# Patient Record
Sex: Female | Born: 1950
Health system: Southern US, Community
[De-identification: ages and names within clinical notes are randomized; demographics above are authoritative.]

## PROBLEM LIST (undated history)

## (undated) DIAGNOSIS — E559 Vitamin D deficiency, unspecified: Secondary | ICD-10-CM

## (undated) DIAGNOSIS — E785 Hyperlipidemia, unspecified: Secondary | ICD-10-CM

## (undated) DIAGNOSIS — I5043 Acute on chronic combined systolic (congestive) and diastolic (congestive) heart failure: Secondary | ICD-10-CM

## (undated) DIAGNOSIS — E039 Hypothyroidism, unspecified: Secondary | ICD-10-CM

## (undated) DIAGNOSIS — I10 Essential (primary) hypertension: Secondary | ICD-10-CM

## (undated) DIAGNOSIS — M549 Dorsalgia, unspecified: Secondary | ICD-10-CM

## (undated) DIAGNOSIS — I499 Cardiac arrhythmia, unspecified: Secondary | ICD-10-CM

## (undated) DIAGNOSIS — M25559 Pain in unspecified hip: Secondary | ICD-10-CM

## (undated) DIAGNOSIS — I2510 Atherosclerotic heart disease of native coronary artery without angina pectoris: Secondary | ICD-10-CM

## (undated) DIAGNOSIS — I4891 Unspecified atrial fibrillation: Secondary | ICD-10-CM

## (undated) DIAGNOSIS — M1990 Unspecified osteoarthritis, unspecified site: Secondary | ICD-10-CM

## (undated) DIAGNOSIS — R112 Nausea with vomiting, unspecified: Secondary | ICD-10-CM

## (undated) DIAGNOSIS — Z9889 Other specified postprocedural states: Secondary | ICD-10-CM

## (undated) DIAGNOSIS — T8859XA Other complications of anesthesia, initial encounter: Secondary | ICD-10-CM

## (undated) DIAGNOSIS — I219 Acute myocardial infarction, unspecified: Secondary | ICD-10-CM

## (undated) HISTORY — DX: Acute myocardial infarction, unspecified: I21.9

## (undated) HISTORY — DX: Vitamin D deficiency, unspecified: E55.9

## (undated) HISTORY — PX: COLONOSCOPY: SHX174

## (undated) HISTORY — PX: SHOULDER ARTHROSCOPY: SHX128

## (undated) HISTORY — PX: ABDOMINAL HYSTERECTOMY: SHX81

## (undated) HISTORY — PX: TUBAL LIGATION: SHX77

## (undated) HISTORY — DX: Hyperlipidemia, unspecified: E78.5

## (undated) HISTORY — PX: CHOLECYSTECTOMY: SHX55

## (undated) HISTORY — PX: OTHER SURGICAL HISTORY: SHX169

---

## 2001-03-22 ENCOUNTER — Emergency Department (HOSPITAL_COMMUNITY): Admission: EM | Admit: 2001-03-22 | Discharge: 2001-03-22 | Payer: Self-pay | Admitting: Internal Medicine

## 2005-07-15 ENCOUNTER — Encounter: Admission: RE | Admit: 2005-07-15 | Discharge: 2005-07-15 | Payer: Self-pay | Admitting: Internal Medicine

## 2005-12-11 ENCOUNTER — Ambulatory Visit: Payer: Self-pay | Admitting: Orthopedic Surgery

## 2006-01-24 ENCOUNTER — Ambulatory Visit (HOSPITAL_COMMUNITY): Admission: RE | Admit: 2006-01-24 | Discharge: 2006-01-24 | Payer: Self-pay | Admitting: General Surgery

## 2006-01-24 ENCOUNTER — Encounter (INDEPENDENT_AMBULATORY_CARE_PROVIDER_SITE_OTHER): Payer: Self-pay | Admitting: General Surgery

## 2006-01-29 ENCOUNTER — Ambulatory Visit: Payer: Self-pay | Admitting: Orthopedic Surgery

## 2006-02-24 ENCOUNTER — Ambulatory Visit: Payer: Self-pay | Admitting: Orthopedic Surgery

## 2006-04-21 ENCOUNTER — Ambulatory Visit: Payer: Self-pay | Admitting: Orthopedic Surgery

## 2006-08-20 ENCOUNTER — Emergency Department (HOSPITAL_COMMUNITY): Admission: EM | Admit: 2006-08-20 | Discharge: 2006-08-20 | Payer: Self-pay | Admitting: Emergency Medicine

## 2007-06-30 ENCOUNTER — Ambulatory Visit: Payer: Self-pay | Admitting: Internal Medicine

## 2007-07-03 ENCOUNTER — Ambulatory Visit (HOSPITAL_COMMUNITY): Admission: RE | Admit: 2007-07-03 | Discharge: 2007-07-03 | Payer: Self-pay | Admitting: Internal Medicine

## 2007-07-03 ENCOUNTER — Ambulatory Visit: Payer: Self-pay | Admitting: Internal Medicine

## 2008-11-03 ENCOUNTER — Emergency Department (HOSPITAL_COMMUNITY): Admission: EM | Admit: 2008-11-03 | Discharge: 2008-11-04 | Payer: Self-pay | Admitting: Emergency Medicine

## 2009-03-03 ENCOUNTER — Ambulatory Visit (HOSPITAL_COMMUNITY): Admission: RE | Admit: 2009-03-03 | Discharge: 2009-03-03 | Payer: Self-pay | Admitting: Family Medicine

## 2009-08-15 ENCOUNTER — Ambulatory Visit (HOSPITAL_COMMUNITY): Admission: RE | Admit: 2009-08-15 | Discharge: 2009-08-15 | Payer: Self-pay | Admitting: Family Medicine

## 2009-08-22 ENCOUNTER — Ambulatory Visit: Payer: Self-pay | Admitting: Vascular Surgery

## 2009-11-21 ENCOUNTER — Ambulatory Visit: Payer: Self-pay | Admitting: Vascular Surgery

## 2010-01-29 ENCOUNTER — Ambulatory Visit: Payer: Self-pay | Admitting: Vascular Surgery

## 2010-02-06 ENCOUNTER — Ambulatory Visit: Payer: Self-pay | Admitting: Vascular Surgery

## 2010-03-05 ENCOUNTER — Ambulatory Visit: Payer: Self-pay | Admitting: Vascular Surgery

## 2010-03-13 ENCOUNTER — Ambulatory Visit: Payer: Self-pay | Admitting: Vascular Surgery

## 2010-12-23 ENCOUNTER — Encounter: Payer: Self-pay | Admitting: Family Medicine

## 2011-03-15 ENCOUNTER — Other Ambulatory Visit (HOSPITAL_COMMUNITY): Payer: Self-pay | Admitting: Family Medicine

## 2011-03-15 DIAGNOSIS — Z139 Encounter for screening, unspecified: Secondary | ICD-10-CM

## 2011-03-26 ENCOUNTER — Ambulatory Visit (HOSPITAL_COMMUNITY)
Admission: RE | Admit: 2011-03-26 | Discharge: 2011-03-26 | Disposition: A | Discharge status: Home / Self Care | Payer: BC Managed Care – PPO | Source: Ambulatory Visit | Attending: Family Medicine | Admitting: Family Medicine

## 2011-03-26 DIAGNOSIS — Z139 Encounter for screening, unspecified: Secondary | ICD-10-CM

## 2011-03-26 DIAGNOSIS — R928 Other abnormal and inconclusive findings on diagnostic imaging of breast: Secondary | ICD-10-CM | POA: Insufficient documentation

## 2011-03-26 DIAGNOSIS — Z1231 Encounter for screening mammogram for malignant neoplasm of breast: Secondary | ICD-10-CM | POA: Insufficient documentation

## 2011-03-27 ENCOUNTER — Other Ambulatory Visit: Payer: Self-pay | Admitting: Family Medicine

## 2011-03-27 DIAGNOSIS — R928 Other abnormal and inconclusive findings on diagnostic imaging of breast: Secondary | ICD-10-CM

## 2011-04-03 ENCOUNTER — Ambulatory Visit (HOSPITAL_COMMUNITY)
Admission: RE | Admit: 2011-04-03 | Discharge: 2011-04-03 | Disposition: A | Discharge status: Home / Self Care | Payer: BC Managed Care – PPO | Source: Ambulatory Visit | Attending: Family Medicine | Admitting: Family Medicine

## 2011-04-03 ENCOUNTER — Other Ambulatory Visit: Payer: Self-pay | Admitting: Family Medicine

## 2011-04-03 DIAGNOSIS — R928 Other abnormal and inconclusive findings on diagnostic imaging of breast: Secondary | ICD-10-CM

## 2011-04-16 NOTE — Procedures (Signed)
LOWER EXTREMITY VENOUS REFLUX EXAM   INDICATION:  Bilateral legs varicose vein with pain and swelling.   EXAM:  Using color-flow imaging and pulse spectral Doppler analysis, the  right and left common femoral vein, superficial femoral vein, popliteal  and posterior tibial veins are evaluated.  There is evidence suggesting  deep venous insufficiency in the left lower extremity.   The right saphenofemoral junction is not competent.  The right and mid  left GSV is not competent with the caliber as described below.   The right and left proximal short saphenous vein demonstrate  incompetency.   GSV Diameter (used if found to be incompetent only)                                            Right    Left  Proximal Greater Saphenous Vein           0.69 cm  cm  Proximal-to-mid-thigh                     0.61 cm  cm  Mid thigh                                 0.58 cm  0.26 cm  Mid-distal thigh                          0.58 cm  0.26 cm  Distal thigh                              0.69 cm  0.26 cm  Knee                                      0.51 cm  0.24 cm   IMPRESSION:  1. Right and left and mid left greater saphenous vein reflux is      identified with the caliber ranging from on the right 0.51 cm to      0.69 cm knee to groin and on the left 0.24 cm to 0.26 cm knee to      mid thigh.  2. The right and left greater saphenous vein is not aneurysmal.  3. The right and left greater saphenous vein is not tortuous.  4. The right and left deep venous system is competent.  5. The right and left lesser saphenous vein is not competent.  6. No evidence of deep vein thrombosis noted in bilateral legs.        ___________________________________________  Quita Skye Hart [name], M.D.   MG/MEDQ  D:  08/22/2009  T:  08/22/2009  Job:  [id number]

## 2011-04-16 NOTE — Assessment & Plan Note (Signed)
NAMESHILPA, BUSHEE                   CHART#:  [id number]   DATE:  06/30/2007                       DOB:  March 23, 1951   REASON FOR CONSULTATION:  Diarrhea, blood per rectum.   REFERRING PHYSICIAN:  Angus G. Renard Matter, MD.   HISTORY OF PRESENT ILLNESS:  Ms. Dianara Smullen is a very pleasant 60-year-  old African American female with diarrhea since May of this year.  She  is sent over by Dr. Renard Matter for further evaluation.  She tells me her  bowels loosened up somewhat since she had her gallbladder out by Dr.  Kathaleen Maser. [facility]man in [place], [place], back in 2005; but around May  of this year, about the time she started antibiotics for sinusitis and  bronchitis, she started having diarrhea and has had it ever since.  She  may have six to eight bowel movements daily, occasionally has blood per  rectum.  If she takes Imodium it may lock her up for two days and she  has diarrhea once again.  She has never had her lower GI tract imaged.  There is no family history of colon cancer.  I saw this nice lady back  in 2000 for gastroesophageal reflux disease.  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  She has done well on Protonix  40 mg orally daily since that time.  Stool studies done by Dr. Renard Matter  included a culture which revealed moderate yeast.  Moderate yeast also  showed up on ova and parasite assay.  I do not have a C. difficile toxin  assay.  She had mild anemia with an H&H of 12.7 and 39.9 back on May 12, 2007.  MCV at that time was 93.2.   PAST MEDICAL HISTORY:  1. Peripheral neuropathy for which she sees Dr. Gerilyn Pilgrim.  She is on      Lidoderm patches and __________ injections to her thigh      periodically.  2. Hypertension.   PAST SURGICAL HISTORY:  1. Tubal ligation.  2. Hysterectomy.  3. Heel spurs right shoulder.  4. Cholecystectomy.   CURRENT MEDICATIONS:  1. Benicar/hydrochlorothiazide 20/12.5 daily.  2. Keppra 500 mg one in the morning and two in the  evening.  3. Loperamide 2 mg t.i.d.  4. Lidoderm patch q.12h.  5. __________ injection Dr. Gerilyn Pilgrim.  6. Protonix 40 mg orally daily.   ALLERGIES:  LODINE, DEMEROL.   FAMILY HISTORY:  Mother died in a house fire at age 84.  Father died  with an MI in his 49s.   SOCIAL HISTORY:  Patient is single, employed at __________.  Former  smoker, no smoking, no tobacco in four years.  No alcohol.   REVIEW OF SYSTEMS:  No odynophagia, dysphagia, early satiety.  Reflux  symptoms are well controlled on Protonix.  Has not lost any weight.   PHYSICAL EXAMINATION:  VITAL SIGNS:  Weight 177, height 5 feet 5,  temperature 98.1, blood pressure 110/80, pulse 64.  GENERAL APPEARANCE:  A pleasant 60 year old lady resting comfortably.  SKIN:  Warm and dry.  HEENT:  No scleral icterus.  Conjunctivae are pink.  CHEST/LUNGS:  Clear to auscultation.  CARDIOVASCULAR:  Regular rate and rhythm without murmurs, rubs, or  gallops.  BREASTS:  Exam is deferred.  ABDOMEN:  Flat, positive bowel sounds,  soft, nontender, without  appreciable mass or organomegaly.  RECTAL:  Exam deferred to the time of colonoscopy.   IMPRESSION:  Ms. Christne Platts is a pleasant 60 year old lady who has  developed diarrhea temporally related to antibiotic therapy for both  sinusitis and bronchitis back in May.  I suspect antibiotics was the  culprit in causing the diarrhea.  She has been noted to have quite a bit  of yeast in her stool on recent stool studies which is the indicator of  upheaval of the normal bacterial milieu in the colon.  At this point,  Clostridium difficile is not ruled out.  She has intermittent rectal  bleeding.  She has never had her lower gastrointestinal tract imaged.   RECOMMENDATIONS:  I feel the most expeditious approach in this setting  was to go ahead and proceed with a colonoscopy forthwith.  We can  evaluate her rectal bleeding and further evaluate diarrhea at this time.  She may end up getting a  course of antifungal therapy as well as  probiotics, etc., depending on what is found at colonoscopy.  We  certainly need to be concerned about the possibility of C. difficile,  but again I suspect that antibiotic therapy recently has upset the  normal intestinal flora of her colon and precipitated this diarrheal  illness.  I have guarded optimism we will be able to help this nice  lady.   I would like Dr. Ishmael Holter. McInnis for allowing me to see this nice lady  once again in consultation.       Jonathon Bellows, M.D.  Electronically Signed    RMR/MEDQ  D:  06/30/2007  T:  07/01/2007  Job:  [id number]   cc:   Angus G. Renard Matter, MD

## 2011-04-16 NOTE — Assessment & Plan Note (Signed)
OFFICE VISIT   Swaim, [name]  DOB:  [birth year]                                       11/21/2009  CHART#:[id number]   The patient returns today 3 months post evaluation of both lower  extremities for severe venous insufficiency with painful varicosities.  Her venous hypertension causes aching, throbbing and burning discomfort  in both lower extremities which worsen as the day progresses and affect  her ability to ambulate without pain.  She has been wearing long-leg  elastic compression stockings (20 mm - 30 mm gradient) as well as  elevating her legs and trying ibuprofen on a regular basis with no  improvement in her symptomatology.   Venous duplex exam at the last visit revealed reflux in the left small  saphenous vein and in the right great saphenous vein feeding these  painful varicosities with no evidence of deep venous obstruction.  On  exam today she continues to have bulging varicosities most pronounced in  the left calf area, the right medial thigh area with edema bilaterally  at 1+ and prominent reticular and spider veins in the lower third of the  leg.  She has 2+ dorsalis pedis pulses palpable bilaterally.   PLAN:  I think the best plan would be:  1. Laser ablation of the left small saphenous vein with multiple stab      phlebectomies to be followed by.  2. Laser ablation of the right great saphenous vein with multiple stab      phlebectomies.   We will proceed with precertification for these to try to relieve this  nice lady's symptoms which are affecting her daily living.     Quita Skye Hart [name], M.D.  Electronically Signed   JDL/MEDQ  D:  11/21/2009  T:  11/22/2009  Job:  [id number]

## 2011-04-16 NOTE — Procedures (Signed)
DUPLEX DEEP VENOUS EXAM - LOWER EXTREMITY   INDICATION:  Status post right greater saphenous vein closed with  phlebectomy.   HISTORY:  Edema:  No.  Trauma/Surgery:  Post right greater saphenous vein closed with  phlebectomy.  Pain:  Yes.  PE:  No.  Previous DVT:  No.  Anticoagulants:  No.  Other:   DUPLEX EXAM:                CFV   SFV   PopV  PTV    GSV                R  L  R  L  R  L  R   L  R     L  Thrombosis    o  o  o     o     o      closed  Spontaneous   +  +  +     +     +      +  Phasic        +  +  +     +     +      +  Augmentation  +  +  +     +     +      +  Compressible  +  +  +     +     +      +  Competent     +  +  +     +     +      +   Legend:  + - yes  o - no  p - partial  D - decreased   IMPRESSION:  1. Right leg appears to be negative for deep venous thrombosis.  2. The right greater saphenous vein appears to be closed from the      saphenofemoral junction to the proximal calf.    _____________________________  Quita Skye. Hart [name], M.D.   NT/MEDQ  D:  03/13/2010  T:  03/13/2010  Job:  [id number]

## 2011-04-16 NOTE — Assessment & Plan Note (Signed)
OFFICE VISIT   Hinckley, Nyrie D  DOB:  August 03, 1951                                       02/06/2010  CHART#:[id number]   Patient returns 1 week post laser ablation of the left small saphenous  vein with greater than 20 stab phlebectomies for painful varicosities.  She has done very well since her procedure with a moderate discomfort  over the closed saphenous vein in the left posterior calf area.  She has  had no distal edema, has worn her stocking, and taking her ibuprofen as  instructed.   Venous duplex exam today reveals total closure of the left small  saphenous vein with no DVT.   She is reassured regarding these findings, and we will schedule her in  the near future for laser ablation of her right great saphenous vein as  her next stage of treatment.     Quita Skye Hart [name], M.D.  Electronically Signed   JDL/MEDQ  D:  02/06/2010  T:  02/06/2010  Job:  [id number]

## 2011-04-16 NOTE — Assessment & Plan Note (Signed)
OFFICE VISIT   [name], Maria Fry  DOB:  August 05, 1951                                       03/05/2010  CHART#:[id number]   Patient underwent laser ablation of her right great saphenous vein with  7 stab phlebectomies of the right distal thigh and calf and tolerated  the procedure well.   She will return in 1 week for followup for venous duplex exam to confirm  closure of the saphenous vein.     Quita Skye Hart [name], [name].  Electronically Signed   JDL/MEDQ  Fry:  03/05/2010  T:  03/06/2010  Job:  [id number]

## 2011-04-16 NOTE — Assessment & Plan Note (Signed)
OFFICE VISIT   Kosiba, Reisa D  DOB:  12-01-1951                                       03/13/2010  CHART#:[id number]   The patient returns 1 week post laser ablation of her right great  saphenous vein with multiple stab phlebectomies in the thigh and calf.  She has had some mild to moderate discomfort in the right thigh over the  great saphenous vein extending from the knee to the saphenofemoral  junction area which is improving.  She has had no distal edema and no  pain in the stab phlebectomy sites in the calf and distal thigh.  Left  leg continues to feel great following ablation of her left small  saphenous vein previously.   Venous duplex exam today is negative for deep venous obstruction and  total closure of the right great saphenous vein from the proximal calf  to the saphenofemoral junction.  She has some mild ecchymosis beneath  the stab phlebectomy wounds in the calf and mild tenderness along the  course of the great saphenous vein in the right thigh with no distal  edema on physical exam.  I have reassured her regarding these findings  and she is very pleased with her result and she will return to [place] on a  p.r.n. basis.     Quita Skye Hart [name], M.D.  Electronically Signed   JDL/MEDQ  D:  03/13/2010  T:  03/14/2010  Job:  [id number]

## 2011-04-16 NOTE — Op Note (Signed)
NAME:  Maria Fry, Maria Fry                  ACCOUNT NO.:  [id number]   MEDICAL RECORD NO.:  [id number]          PATIENT TYPE:  AMB   LOCATION:  DAY                           FACILITY:  [facility]   PHYSICIAN:  R. Roetta Sessions, M.D. DATE OF BIRTH:  1951-05-19   DATE OF PROCEDURE:  07/03/2007  DATE OF DISCHARGE:                               OPERATIVE REPORT   COLONOSCOPY WITH ILEOSCOPY, DIAGNOSTIC.   INDICATIONS FOR PROCEDURE:  A 60 year old lady with diarrhea related to  antibiotic therapy.  She has had some intermittent blood per rectum.  Stool study thus far has demonstrated diminished normal flora and yeast.  Colonoscopy is now being done.  This approach has been discussed with  the patient at length.  The potential risks, benefits and alternatives  have been reviewed.  Please see the documentation in medical record.   PROCEDURE NOTE:  The O2 saturation, blood pressure, pulse and  respirations were monitored throughout the entire procedure.   CONSCIOUS SEDATION:  1. Versed 4 mg IV.  2. Fentanyl 100 mcg IV in divided doses.   INSTRUMENT:  Pentax video chip system.   FINDINGS:  Digital rectal exam revealed no abnormalities.   ENDOSCOPIC FINDINGS:  The preparation was adequate.  Colon: Colonic  mucosa was surveyed from the rectosigmoid junction through the left  transverse, right colon to the appendiceal orifice, ileocecal valve and  cecum.  These structures were well seen and photographed for the record.  The terminal ileum was intubated to 5 cm.  From this level, the scope  was slowly withdrawn.  All previously mentioned mucosal surfaces were  again seen.  The colonic mucosa appeared normal.  A stool sample was  obtained for microbiology studies.  The scope was pulled out of the  rectum.  A thorough examination of rectal mucosa, including a  retroflexed view of the anal verge, demonstrated only anal papilla  internal hemorrhoids.  The patient tolerated the procedure well and was  reactive to endoscopy.   IMPRESSION:  1. Anal papilla internal hemorrhoids, otherwise normal rectum.  2. Normal colon and terminal ileum, stool sample obtained.   RECOMMENDATIONS:  1. Use Imodium p.r.n. diarrhea for the time being.  2. Align Probiotic supplement, 1 capsule daily.  3. Hemorrhoid literature provided to Ms. Boak.  4. A 10-day course of Anusol-HC suppositories, one per rectum at      bedtime for 10 days.  5. Followup on stool studies and make further recommendations once      they are available for review.      Jonathon Bellows, M.D.  Electronically Signed     RMR/MEDQ  D:  07/03/2007  T:  07/03/2007  Job:  [id number]   cc:   Angus G. Renard Matter, MD  Fax: [phone]

## 2011-04-16 NOTE — Assessment & Plan Note (Signed)
OFFICE VISIT   Fry, [name]  DOB:  [birth year]                                       01/29/2010  CHART#:[id number]   The patient  underwent laser ablation of her left small saphenous vein  today with greater than 20 stab phlebectomies for painful varicosities.  She tolerated the procedure well.  Return in 1 week, on March 8 for  ultrasound of her left leg venous system.     Quita Skye Hart [name], M.D.  Electronically Signed   JDL/MEDQ  D:  01/29/2010  T:  01/30/2010  Job:  [id number]

## 2011-04-16 NOTE — Procedures (Signed)
DUPLEX DEEP VENOUS EXAM - LOWER EXTREMITY   INDICATION:  Follow up left short saphenous vein ablation.   HISTORY:  Edema:  Left lower extremity.  Trauma/Surgery:  Left short saphenous vein ablation, 01/29/10.  Pain:  Left lower extremity.  PE:  No.  Previous DVT:  No.  Anticoagulants:  No.  Other:   DUPLEX EXAM:                CFV   SFV   PopV  PTV    GSV                R  L  R  L  R  L  R   L  R  L  Thrombosis    o  o     o     o      o     o  Spontaneous   +  +     +     +      +     +  Phasic        +  +     +     +      +     +  Augmentation  +  +     +     +      +     +  Compressible  +  +     +     +      +     +  Competent     o  o     +     +      +     o   Legend:  + - yes  o - no  p - partial  D - decreased   IMPRESSION:  1. No evidence of deep venous thrombosis in the left lower extremity      or right common femoral vein.  2. Evidence of ablation of the left short saphenous vein from      popliteal saphenous junction to the mid calf without flow.  Does      not extend into the popliteal vein.  3. Evidence of venous insufficiency noted in the bilateral common      femoral veins and left greater saphenous vein.    _____________________________  Quita Skye. Hart [name], M.D.   AS/MEDQ  D:  02/06/2010  T:  02/06/2010  Job:  [id number]

## 2011-04-16 NOTE — Consult Note (Signed)
NEW PATIENT CONSULTATION   Fry, [name]  DOB:  1951/10/08                                       08/22/2009  CHART#:[id number]   The patient is a 60 year old female complaining of swelling in both  lower extremities as well as pain in both lower extremities which has  worsened over the last few years.  She states that she has a heavy  aching burning discomfort from the ankles to the knees and also on the  distal thigh left leg worse than right.  This becomes worse as the day  progresses while she is on her feet.  She notices swelling in the calf  and ankle bilaterally and a throbbing discomfort.  She has no history of  thrombophlebitis, deep venous thrombosis, stasis ulcers or bleeding.  She has been wearing some short leg compression stockings (30 mm) which  have helped but not relieved her symptoms.  She does work all day at an  Teacher, English as a foreign language and is unable to elevate her legs during work.  She  does take tramadol for pain on a regular basis.   PAST MEDICAL HISTORY:  1. Hypertension.  2. Myalgia paresthetica.  3. Negative for diabetes, coronary artery disease, COPD or stroke.   PAST SURGICAL HISTORY:  1. Tonsillectomy.  2. Cholecystectomy.  3. Heel spurs.  4. Shoulder surgery.   FAMILY HISTORY:  Positive for myocardial infarction in her father and a  brother, stroke in a sister.  Negative for diabetes.   SOCIAL HISTORY:  She is married, has three children, works in an  Teacher, English as a foreign language.  She does not use tobacco, has not since 2004 and does  not drink alcohol.   REVIEW OF SYSTEMS:  She does have lower extremity discomfort as  described, has arthritis, joint pain, muscle pain but denies any chest  pain, dyspnea on exertion, PND, orthopnea, hemoptysis, GI or GU  symptoms, otherwise negative in all systems.  Please see health history  form.   ALLERGIES:  Lodine and Demerol.   MEDICATIONS:  Please see health history form.   PHYSICAL EXAMINATION:   Vital signs:  Blood pressure is 149/88, heart  rate is 64, respirations 14.  General:  She is a middle-aged female in  no apparent distress, alert and oriented x3.  Neck:  Supple, 3+ carotid  pulse is palpable.  No bruits are audible.  Neurological:  Normal.  No  palpable adenopathy in the neck.  Chest:  Clear to auscultation.  Cardiovascular:  Regular rhythm, no murmurs.  Abdomen:  Soft, nontender  with no masses.  Extremities:  Reveals 3+ femoral, popliteal and  dorsalis pedis pulses bilaterally.  Both legs have 1+ edema distally.  Left leg has bulging varicosities in the left calf posteriorly up at the  popliteal fossa with smaller varicosities over the greater saphenous  system.  Right leg has bulging varicosities in the mid to distal thigh  medially over the great saphenous system and a few below the knee  medially.  There are reticular veins in the ankle areas bilaterally.   Venous duplex exam revealed 1) gross reflux in the left small saphenous  vein feeding the varicosities in the left calf, 2) gross reflux in the  right great saphenous vein throughout the length of and including the  saphenofemoral junction, 3) gross reflux in the right small saphenous  vein.   PLAN:  I think that we should treat the patient with long-leg elastic  compression stockings (20 mm - 30 mm gradient).  She will elevate her  legs as much as possible and as much as her job will permit and also  continue taking pain medicine on a regular basis to see if this will  help relieve her symptoms.  She will return in 3 months and if there has  been no improvement I think she should have the following 1) laser  ablation of left small saphenous vein with multiple stab phlebectomies  to be followed by 2) laser ablation of right great saphenous vein with  multiple stab phlebectomies.  She will return in 3 months.   Quita Skye Hart [name], M.D.  Electronically Signed   JDL/MEDQ  D:  08/22/2009  T:  08/23/2009  Job:   [id number]   cc:   Angus G. Renard Matter, MD  Dr Theola Sequin

## 2011-09-16 LAB — STOOL CULTURE

## 2011-09-16 LAB — CLOSTRIDIUM DIFFICILE EIA: C difficile Toxins A+B, EIA: NEGATIVE

## 2011-09-16 LAB — OVA AND PARASITE EXAMINATION: Ova and parasites: NONE SEEN

## 2011-09-16 LAB — FECAL LACTOFERRIN, QUANT: Fecal Lactoferrin: NEGATIVE

## 2013-01-17 ENCOUNTER — Emergency Department (HOSPITAL_COMMUNITY): Payer: BC Managed Care – PPO

## 2013-01-17 ENCOUNTER — Encounter (HOSPITAL_COMMUNITY): Payer: Self-pay | Admitting: *Deleted

## 2013-01-17 ENCOUNTER — Emergency Department (HOSPITAL_COMMUNITY)
Admission: EM | Admit: 2013-01-17 | Discharge: 2013-01-17 | Disposition: A | Discharge status: Home / Self Care | Payer: BC Managed Care – PPO | Attending: Emergency Medicine | Admitting: Emergency Medicine

## 2013-01-17 DIAGNOSIS — S8990XA Unspecified injury of unspecified lower leg, initial encounter: Secondary | ICD-10-CM | POA: Insufficient documentation

## 2013-01-17 DIAGNOSIS — M25562 Pain in left knee: Secondary | ICD-10-CM

## 2013-01-17 DIAGNOSIS — I10 Essential (primary) hypertension: Secondary | ICD-10-CM | POA: Insufficient documentation

## 2013-01-17 DIAGNOSIS — Z79899 Other long term (current) drug therapy: Secondary | ICD-10-CM | POA: Insufficient documentation

## 2013-01-17 DIAGNOSIS — Y929 Unspecified place or not applicable: Secondary | ICD-10-CM | POA: Insufficient documentation

## 2013-01-17 DIAGNOSIS — W010XXA Fall on same level from slipping, tripping and stumbling without subsequent striking against object, initial encounter: Secondary | ICD-10-CM | POA: Insufficient documentation

## 2013-01-17 DIAGNOSIS — IMO0002 Reserved for concepts with insufficient information to code with codable children: Secondary | ICD-10-CM | POA: Insufficient documentation

## 2013-01-17 DIAGNOSIS — Y939 Activity, unspecified: Secondary | ICD-10-CM | POA: Insufficient documentation

## 2013-01-17 DIAGNOSIS — S99929A Unspecified injury of unspecified foot, initial encounter: Secondary | ICD-10-CM | POA: Insufficient documentation

## 2013-01-17 HISTORY — DX: Pain in unspecified hip: M25.559

## 2013-01-17 NOTE — ED Notes (Signed)
Pt fell Thursday and today, denies hitting her head, bruising and swelling noted to left knee

## 2013-01-17 NOTE — ED Provider Notes (Signed)
History     CSN: [id number]  Arrival date & time 01/17/13  1428   First MD Initiated Contact with Patient 01/17/13 1551      Chief Complaint  Patient presents with  . Knee Pain    (Consider location/radiation/quality/duration/timing/severity/associated sxs/prior treatment) HPI Comments: Patient with hx of HTN was taking out the trash this morning when she slipped on the ice and fell face forward onto her lawn. Primary impact to left knee which is causing her pain. Denies hitting head or LOC. Patient has been ambulatory since the event. HP high in triage. States she has been compliant in taking her medication. Denies vision changes, headache, CP, SOB, and numbness/tingling in extremities.  Patient is a 62 y.o. female presenting with knee pain. The history is provided by the patient. No language interpreter was used.  Knee Pain Location:  Knee Time since incident: this morning. Knee location:  L knee Pain details:    Quality:  Throbbing   Radiates to: throb radiates up to mid thigh.   Severity:  Mild   Timing:  Intermittent   Progression:  Unchanged Chronicity:  New Dislocation: no   Foreign body present:  No foreign bodies Relieved by:  Rest and immobilization Exacerbated by: palpation. Associated symptoms: swelling   Associated symptoms: no back pain, no decreased ROM, no fever, no muscle weakness, no neck pain, no numbness and no tingling     Past Medical History  Diagnosis Date  . Hypertension   . Hip pain     Past Surgical History  Procedure Laterality Date  . Tubal ligation    . Abdominal hysterectomy    . Cholecystectomy    . Shoulder arthroscopy    . Heel spurs      No family history on file.  History  Substance Use Topics  . Smoking status: Never Smoker   . Smokeless tobacco: Not on file  . Alcohol Use: No    OB History   Grav Para Term Preterm Abortions TAB SAB Ect Mult Living                  Review of Systems  Constitutional: Negative for  fever.  HENT: Negative for neck pain and neck stiffness.   Eyes: Negative for photophobia and visual disturbance.  Respiratory: Negative for shortness of breath.   Cardiovascular: Negative for chest pain.  Gastrointestinal: Negative for nausea and vomiting.  Genitourinary: Negative.   Musculoskeletal: Negative for back pain.  Skin: Positive for wound.  Neurological: Negative for weakness, light-headedness and headaches.    Allergies  Demerol and Lodine  Home Medications   Current Outpatient Rx  Name  Route  Sig  Dispense  Refill  . olmesartan-hydrochlorothiazide (BENICAR HCT) 20-12.5 MG per tablet   Oral   Take 1 tablet by mouth daily.           BP 177/103  Pulse 92  Temp(Src) 98.1 F (36.7 C) (Oral)  Resp 20  Ht 5' (1.524 m)  Wt 176 lb (79.833 kg)  BMI 34.37 kg/m2  SpO2 99%  Physical Exam  Nursing note and vitals reviewed. Constitutional: She is oriented to person, place, and time. She appears well-developed and well-nourished. No distress.  HENT:  Head: Normocephalic and atraumatic.  Mouth/Throat: No oropharyngeal exudate.  Eyes: Conjunctivae are normal. Pupils are equal, round, and reactive to light. No scleral icterus.  Neck: Normal range of motion.  Cardiovascular: Normal rate, regular rhythm and normal heart sounds.   Pulmonary/Chest: Effort normal and  breath sounds normal. No respiratory distress.  Musculoskeletal: Normal range of motion.       Left hip: Normal.       Left knee: She exhibits swelling and ecchymosis. She exhibits normal range of motion, no effusion, no deformity, no laceration, no erythema, normal alignment, no LCL laxity, normal patellar mobility and no MCL laxity. Tenderness found. Lateral joint line tenderness noted. No medial joint line tenderness noted.       Left ankle: Normal.  Neurological: She is alert and oriented to person, place, and time. She has normal strength and normal reflexes. No cranial nerve deficit or sensory deficit.  Coordination normal.  Skin: Skin is intact. Bruising and ecchymosis noted. No abrasion, no laceration and no petechiae noted. She is not diaphoretic. There is erythema.     Psychiatric: She has a normal mood and affect. Her behavior is normal.    ED Course  Procedures (including critical care time)  Labs Reviewed - No data to display Dg Knee Complete 4 Views Left  01/17/2013  *RADIOLOGY REPORT*  Clinical Data: Fall, knee pain, swelling.  LEFT KNEE - COMPLETE 4+ VIEW  Comparison: None  Findings: Early spurring within the patellofemoral compartment. No acute bony abnormality.  Specifically, no fracture, subluxation, or dislocation.  Soft tissues are intact.  No joint effusion.  IMPRESSION: No acute bony abnormality.   Original Report Authenticated By: Charlett Nose, M.D.      1. Acute knee pain, left      MDM  Patient presents after slipping and falling forward on her lawn making primary impact with her left knee. Patient admits to throbbing pain along the lateral side of her left knee. Ecchymosis and superficial abrasions appreciated on physical exam. No limited ROM of knee and neurovascularly intact. Patient ambulatory. Xray negative for fracture, subluxation, or dislocation. Patient stable for d/c; well appearing, nontoxic and VSS. Will provide knee immobilizer; patient does not want crutches. Have provided RICE instructions and advised ibuprofen 400mg  q 6hrs for inflammation. Patient instructed to follow up with PCP in 1 day regarding today's visit. Patient states comfort with this management plan. Have discussed patient, work up and management plan with Dr. Hyacinth Meeker.   Filed Vitals:   01/17/13 1514 01/17/13 1642  BP: 177/103 138/74  Pulse: 92 71  Temp: 98.1 F (36.7 C) 98 F (36.7 C)  TempSrc: Oral Oral  Resp: 20 18  Height: 5' (1.524 m)   Weight: 176 lb (79.833 kg)   SpO2: 99% 99%        Antony Madura, PA-C 01/17/13 2019

## 2013-01-17 NOTE — ED Notes (Signed)
Pt states she has slipped twice on the ice. Last fall was today. States pain and bruising to left knee. Small abrasions to both arms.

## 2013-01-19 NOTE — ED Provider Notes (Signed)
Medical screening examination/treatment/procedure(s) were performed by non-physician practitioner and as supervising physician I was immediately available for consultation/collaboration.    Sarinity Dicicco D Jazmin Vensel, MD 01/19/13 0013 

## 2013-02-05 ENCOUNTER — Emergency Department (HOSPITAL_COMMUNITY)
Admission: EM | Admit: 2013-02-05 | Discharge: 2013-02-06 | Disposition: A | Discharge status: Home / Self Care | Payer: BC Managed Care – PPO | Attending: Emergency Medicine | Admitting: Emergency Medicine

## 2013-02-05 ENCOUNTER — Encounter (HOSPITAL_COMMUNITY): Payer: Self-pay | Admitting: Emergency Medicine

## 2013-02-05 ENCOUNTER — Emergency Department (HOSPITAL_COMMUNITY): Payer: BC Managed Care – PPO

## 2013-02-05 DIAGNOSIS — R1033 Periumbilical pain: Secondary | ICD-10-CM | POA: Insufficient documentation

## 2013-02-05 DIAGNOSIS — R197 Diarrhea, unspecified: Secondary | ICD-10-CM | POA: Insufficient documentation

## 2013-02-05 DIAGNOSIS — I10 Essential (primary) hypertension: Secondary | ICD-10-CM | POA: Insufficient documentation

## 2013-02-05 DIAGNOSIS — R109 Unspecified abdominal pain: Secondary | ICD-10-CM

## 2013-02-05 DIAGNOSIS — R5383 Other fatigue: Secondary | ICD-10-CM | POA: Insufficient documentation

## 2013-02-05 DIAGNOSIS — R509 Fever, unspecified: Secondary | ICD-10-CM | POA: Insufficient documentation

## 2013-02-05 DIAGNOSIS — Z79899 Other long term (current) drug therapy: Secondary | ICD-10-CM | POA: Insufficient documentation

## 2013-02-05 DIAGNOSIS — Z90710 Acquired absence of both cervix and uterus: Secondary | ICD-10-CM | POA: Insufficient documentation

## 2013-02-05 DIAGNOSIS — Z9851 Tubal ligation status: Secondary | ICD-10-CM | POA: Insufficient documentation

## 2013-02-05 DIAGNOSIS — R110 Nausea: Secondary | ICD-10-CM | POA: Insufficient documentation

## 2013-02-05 DIAGNOSIS — R5381 Other malaise: Secondary | ICD-10-CM | POA: Insufficient documentation

## 2013-02-05 LAB — CBC WITH DIFFERENTIAL/PLATELET
Basophils Absolute: 0 10*3/uL (ref 0.0–0.1)
Basophils Relative: 0 % (ref 0–1)
Eosinophils Absolute: 0.1 10*3/uL (ref 0.0–0.7)
Eosinophils Relative: 1 % (ref 0–5)
HCT: 34.8 % — ABNORMAL LOW (ref 36.0–46.0)
Hemoglobin: 12 g/dL (ref 12.0–15.0)
Lymphocytes Relative: 10 % — ABNORMAL LOW (ref 12–46)
Lymphs Abs: 1.4 10*3/uL (ref 0.7–4.0)
MCH: 29.8 pg (ref 26.0–34.0)
MCHC: 34.5 g/dL (ref 30.0–36.0)
MCV: 86.4 fL (ref 78.0–100.0)
Monocytes Absolute: 0.8 10*3/uL (ref 0.1–1.0)
Monocytes Relative: 6 % (ref 3–12)
Neutro Abs: 11.3 10*3/uL — ABNORMAL HIGH (ref 1.7–7.7)
Neutrophils Relative %: 83 % — ABNORMAL HIGH (ref 43–77)
Platelets: 278 10*3/uL (ref 150–400)
RBC: 4.03 MIL/uL (ref 3.87–5.11)
RDW: 14.3 % (ref 11.5–15.5)
WBC: 13.6 10*3/uL — ABNORMAL HIGH (ref 4.0–10.5)

## 2013-02-05 LAB — COMPREHENSIVE METABOLIC PANEL
ALT: 16 U/L (ref 0–35)
AST: 26 U/L (ref 0–37)
Albumin: 3.2 g/dL — ABNORMAL LOW (ref 3.5–5.2)
Alkaline Phosphatase: 93 U/L (ref 39–117)
BUN: 23 mg/dL (ref 6–23)
CO2: 27 mEq/L (ref 19–32)
Calcium: 9 mg/dL (ref 8.4–10.5)
Chloride: 104 mEq/L (ref 96–112)
Creatinine, Ser: 0.6 mg/dL (ref 0.50–1.10)
GFR calc Af Amer: >90 mL/min (ref >90)
GFR calc non Af Amer: >90 mL/min (ref >90)
Glucose, Bld: 114 mg/dL — ABNORMAL HIGH (ref 70–99)
Potassium: 3.8 mEq/L (ref 3.5–5.1)
Sodium: 139 mEq/L (ref 135–145)
Total Bilirubin: 0.3 mg/dL (ref 0.3–1.2)
Total Protein: 7.6 g/dL (ref 6.0–8.3)

## 2013-02-05 LAB — LIPASE, BLOOD: Lipase: 25 U/L (ref 11–59)

## 2013-02-05 MED ORDER — ONDANSETRON HCL 4 MG/2ML IJ SOLN
4.0000 mg | Freq: Once | INTRAMUSCULAR | Status: AC
  Administered 2013-02-05: 4 mg via INTRAVENOUS
  Filled 2013-02-05: qty 2

## 2013-02-05 MED ORDER — SODIUM CHLORIDE 0.9 % IV BOLUS (SEPSIS)
500.0000 mL | Freq: Once | INTRAVENOUS | Status: AC
  Administered 2013-02-05: 500 mL via INTRAVENOUS

## 2013-02-05 MED ORDER — IOHEXOL 300 MG/ML  SOLN
50.0000 mL | Freq: Once | INTRAMUSCULAR | Status: AC | PRN
  Administered 2013-02-05: 50 mL via ORAL

## 2013-02-05 MED ORDER — SODIUM CHLORIDE 0.9 % IV SOLN
INTRAVENOUS | Status: DC
  Administered 2013-02-05: 1000 mL via INTRAVENOUS

## 2013-02-05 MED ORDER — HYDROMORPHONE HCL PF 1 MG/ML IJ SOLN
1.0000 mg | Freq: Once | INTRAMUSCULAR | Status: AC
  Administered 2013-02-05: 1 mg via INTRAVENOUS
  Filled 2013-02-05: qty 1

## 2013-02-05 NOTE — ED Notes (Signed)
Patient complaining of abdominal pain and cramping to lower abdomen, diarrhea, and generalized weakness.

## 2013-02-05 NOTE — ED Notes (Signed)
Pt states not able to drink contrast, feels like it is not going down,

## 2013-02-05 NOTE — ED Provider Notes (Addendum)
History  This chart was scribed for Shelda Jakes, MD by Bennett Scrape, ED Scribe. This patient was seen in room APA09/AP[facility]09 and the patient's care was started at 10:05 PM.  CSN: [id number]  Arrival date & time 02/05/13  2126   First MD Initiated Contact with Patient 02/05/13 2205      Chief Complaint  Patient presents with  . Abdominal Pain  . Diarrhea  . Fatigue     Patient is a 62 y.o. female presenting with abdominal pain. The history is provided by the patient. No language interpreter was used.  Abdominal Pain Pain location:  Periumbilical Pain quality: cramping   Pain radiates to:  Does not radiate Onset quality:  Gradual Timing:  Constant Progression:  Worsening Chronicity:  New Relieved by:  Nothing Worsened by:  Nothing tried Ineffective treatments:  None tried Associated symptoms: chills, diarrhea, fever and nausea   Associated symptoms: no chest pain, no cough, no dysuria, no shortness of breath and no sore throat     Maria Fry is a 62 y.o. female who presents to the Emergency Department complaining of 7 hours of periumbilical abdominal pain described as cramping with associated nausea, chills, subjective fever and 6 to 7 episodes of non-bloody diarrhea. She rates her pain a 7 or 8 out of 10 currently. She denies having prior episodes of similar symptoms. She reports a prior abdominal hysterectomy. She denies having any sick contacts with similar symptoms.  She has a h/o HTN and denies smoking and alcohol use. Pt reports 4 falls in two weeks. She states that she was seen 4 days ago at [facility] after the last fall and had a negative CT of the head. She denies any head trauma or LOC.  PCP is Dr. Renard Matter  Past Medical History  Diagnosis Date  . Hypertension   . Hip pain     Past Surgical History  Procedure Laterality Date  . Tubal ligation    . Abdominal hysterectomy    . Cholecystectomy    . Shoulder arthroscopy    . Heel spurs      History  reviewed. No pertinent family history.  History  Substance Use Topics  . Smoking status: Never Smoker   . Smokeless tobacco: Not on file  . Alcohol Use: No    No OB history provided.  Review of Systems  Constitutional: Positive for fever and chills.  HENT: Negative for congestion, sore throat, rhinorrhea and neck pain.   Eyes: Negative for visual disturbance.  Respiratory: Negative for cough and shortness of breath.   Cardiovascular: Negative for chest pain.  Gastrointestinal: Positive for nausea, abdominal pain and diarrhea.  Genitourinary: Negative for dysuria.  Musculoskeletal: Negative for back pain.  Skin: Negative for rash.  Neurological: Negative for light-headedness.  Hematological: Does not bruise/bleed easily.    Allergies  Demerol and Lodine  Home Medications   Current Outpatient Rx  Name  Route  Sig  Dispense  Refill  . ibuprofen (ADVIL,MOTRIN) 200 MG tablet   Oral   Take 400 mg by mouth every 4 (four) hours as needed for pain.         Marland Kitchen olmesartan-hydrochlorothiazide (BENICAR HCT) 20-12.5 MG per tablet   Oral   Take 1 tablet by mouth every morning.            Triage Vitals: BP 133/62  Pulse 79  Temp(Src) 97.9 F (36.6 C) (Oral)  Resp 28  Ht 5' (1.524 m)  Wt 186 lb (84.369 kg)  BMI 36.33 kg/m2  SpO2 100%  Physical Exam  Nursing note and vitals reviewed. Constitutional: She is oriented to person, place, and time. She appears well-developed and well-nourished. No distress.  HENT:  Head: Normocephalic and atraumatic.  Mouth/Throat: Oropharynx is clear and moist.  Moist MM  Eyes: Conjunctivae and EOM are normal. Pupils are equal, round, and reactive to light. No scleral icterus.  Neck: Neck supple. No tracheal deviation present.  Cardiovascular: Normal rate and regular rhythm.  Exam reveals no gallop and no friction rub.   No murmur heard. Pulmonary/Chest: Effort normal and breath sounds normal. No respiratory distress. She has no wheezes.  She has no rales.  Abdominal: Soft. She exhibits no mass. There is tenderness (tenderness around the umbilical region). There is no rebound and no guarding.  Bowel sounds are decreased, no umbilical hernia noted  Musculoskeletal: Normal range of motion. She exhibits no edema (no pedal edema).  Neurological: She is alert and oriented to person, place, and time. No cranial nerve deficit.  Pt able to move both sets of fingers and toes  Skin: Skin is warm and dry.  Scattered ecchymosis to bilateral lower extremities   Psychiatric: She has a normal mood and affect. Her behavior is normal.    ED Course  Procedures (including critical care time)  DIAGNOSTIC STUDIES: Oxygen Saturation is 100% on room air, normal by my interpretation.    COORDINATION OF CARE: 10:25 PM-Discussed treatment plan which includes CT of abdomen, medications, CBC panel, and UA with pt at bedside and pt agreed to plan.   Labs Reviewed  CBC WITH DIFFERENTIAL - Abnormal; Notable for the following:    WBC 13.6 (*)    HCT 34.8 (*)    Neutrophils Relative 83 (*)    Neutro Abs 11.3 (*)    Lymphocytes Relative 10 (*)    All other components within normal limits  COMPREHENSIVE METABOLIC PANEL - Abnormal; Notable for the following:    Glucose, Bld 114 (*)    Albumin 3.2 (*)    All other components within normal limits  LIPASE, BLOOD  URINALYSIS, ROUTINE W REFLEX MICROSCOPIC   No results found. Results for orders placed during the hospital encounter of 02/05/13  CBC WITH DIFFERENTIAL      Result Value Range   WBC 13.6 (*) 4.0 - 10.5 K/uL   RBC 4.03  3.87 - 5.11 MIL/uL   Hemoglobin 12.0  12.0 - 15.0 g/dL   HCT 14.7 (*) 82.9 - 56.2 %   MCV 86.4  78.0 - 100.0 fL   MCH 29.8  26.0 - 34.0 pg   MCHC 34.5  30.0 - 36.0 g/dL   RDW 13.0  86.5 - 78.4 %   Platelets 278  150 - 400 K/uL   Neutrophils Relative 83 (*) 43 - 77 %   Neutro Abs 11.3 (*) 1.7 - 7.7 K/uL   Lymphocytes Relative 10 (*) 12 - 46 %   Lymphs Abs 1.4  0.7 -  4.0 K/uL   Monocytes Relative 6  3 - 12 %   Monocytes Absolute 0.8  0.1 - 1.0 K/uL   Eosinophils Relative 1  0 - 5 %   Eosinophils Absolute 0.1  0.0 - 0.7 K/uL   Basophils Relative 0  0 - 1 %   Basophils Absolute 0.0  0.0 - 0.1 K/uL  COMPREHENSIVE METABOLIC PANEL      Result Value Range   Sodium 139  135 - 145 mEq/L   Potassium 3.8  3.5 -  5.1 mEq/L   Chloride 104  96 - 112 mEq/L   CO2 27  19 - 32 mEq/L   Glucose, Bld 114 (*) 70 - 99 mg/dL   BUN 23  6 - 23 mg/dL   Creatinine, Ser 4.09  0.50 - 1.10 mg/dL   Calcium 9.0  8.4 - 81.1 mg/dL   Total Protein 7.6  6.0 - 8.3 g/dL   Albumin 3.2 (*) 3.5 - 5.2 g/dL   AST 26  0 - 37 U/L   ALT 16  0 - 35 U/L   Alkaline Phosphatase 93  39 - 117 U/L   Total Bilirubin 0.3  0.3 - 1.2 mg/dL   GFR calc non Af Amer >90  >90 mL/min   GFR calc Af Amer >90  >90 mL/min  LIPASE, BLOOD      Result Value Range   Lipase 25  11 - 59 U/L     1. Abdominal pain       MDM  Patient with acute onset of abdominal pain at the 3:30 PM this afternoon is periumbilical crampy in nature suggestive of bowel obstruction or perhaps an umbilical hernia non-palpated though. Associated with nausea but no vomiting diarrhea started at 4:30 saphenous 6-7 episodes no blood in the bowel movement so could be consistent with just a viral enteritis as well. Patient had CT of the abdomen pending for for her evaluation does have a mild leukocytosis.      I personally performed the services described in this documentation, which was scribed in my presence. The recorded information has been reviewed and is accurate.     Shelda Jakes, MD 02/05/13 2352  Shelda Jakes, MD 02/06/13 [phone]

## 2013-02-06 MED ORDER — IOHEXOL 300 MG/ML  SOLN
100.0000 mL | Freq: Once | INTRAMUSCULAR | Status: AC | PRN
  Administered 2013-02-06: 100 mL via INTRAVENOUS

## 2013-02-06 MED ORDER — ONDANSETRON HCL 4 MG/2ML IJ SOLN
4.0000 mg | Freq: Once | INTRAMUSCULAR | Status: AC
  Administered 2013-02-06: 4 mg via INTRAVENOUS
  Filled 2013-02-06: qty 2

## 2013-02-06 MED ORDER — TRAMADOL HCL 50 MG PO TABS: 50.0000 mg | ORAL_TABLET | Freq: Four times a day (QID) | ORAL | Status: DC | PRN

## 2013-02-06 MED ORDER — ONDANSETRON HCL 4 MG PO TABS: 4.0000 mg | ORAL_TABLET | Freq: Four times a day (QID) | ORAL | Status: DC

## 2013-02-06 MED ORDER — HYDROMORPHONE HCL PF 1 MG/ML IJ SOLN
1.0000 mg | Freq: Once | INTRAMUSCULAR | Status: AC
  Administered 2013-02-06: 1 mg via INTRAVENOUS
  Filled 2013-02-06: qty 1

## 2013-02-16 ENCOUNTER — Other Ambulatory Visit: Payer: Self-pay | Admitting: Neurology

## 2013-02-16 DIAGNOSIS — R270 Ataxia, unspecified: Secondary | ICD-10-CM

## 2013-02-19 ENCOUNTER — Ambulatory Visit (HOSPITAL_COMMUNITY)
Admission: RE | Admit: 2013-02-19 | Discharge: 2013-02-19 | Disposition: A | Discharge status: Home / Self Care | Payer: BC Managed Care – PPO | Source: Ambulatory Visit | Attending: Neurology | Admitting: Neurology

## 2013-02-19 DIAGNOSIS — R279 Unspecified lack of coordination: Secondary | ICD-10-CM | POA: Insufficient documentation

## 2013-02-19 DIAGNOSIS — R270 Ataxia, unspecified: Secondary | ICD-10-CM

## 2013-02-19 DIAGNOSIS — Z9181 History of falling: Secondary | ICD-10-CM | POA: Insufficient documentation

## 2013-03-02 ENCOUNTER — Ambulatory Visit (HOSPITAL_COMMUNITY)
Admission: RE | Admit: 2013-03-02 | Discharge: 2013-03-02 | Disposition: A | Discharge status: Home / Self Care | Payer: BC Managed Care – PPO | Source: Ambulatory Visit | Attending: Neurology | Admitting: Neurology

## 2013-03-02 DIAGNOSIS — IMO0001 Reserved for inherently not codable concepts without codable children: Secondary | ICD-10-CM | POA: Insufficient documentation

## 2013-03-02 DIAGNOSIS — M6281 Muscle weakness (generalized): Secondary | ICD-10-CM | POA: Insufficient documentation

## 2013-03-02 DIAGNOSIS — R29898 Other symptoms and signs involving the musculoskeletal system: Secondary | ICD-10-CM | POA: Insufficient documentation

## 2013-03-02 NOTE — Evaluation (Signed)
Physical Therapy Evaluation  Patient Details  Name: Maria Fry MRN: [id number] Date of Birth: 1951/04/21  Today's Date: 03/02/2013 Time: 1111-1200 PT Time Calculation (min): 49 min Charges: 1 eval, 15' TE             Visit#: 1 of 12  Re-eval: 04/01/13  Diagnosis: gait training/balance  Next MD Visit: Dr. Gerilyn Pilgrim - unscheduled  Past Medical History:  Past Medical History  Diagnosis Date  . Hypertension   . Hip pain    Past Surgical History:  Past Surgical History  Procedure Laterality Date  . Tubal ligation    . Abdominal hysterectomy    . Cholecystectomy    . Shoulder arthroscopy    . Heel spurs      Subjective Symptoms/Limitations Pertinent History: Pt is referred to PT for balance and gait disorder.  She reports that she has fallen 5x in the past month.  She is currently out of work until 5/17 when she is to complete therapy.  She reports that she has a significant hx of mylagia prosethica for 10 years and has recieved injections for the pain without any balance disorder.  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 She has been instructed to use a SPC, however did not bring it to therapy today.  Pain Assessment Currently in Pain?: No/denies Pain Score:  (10 when she needs to injection) Pain Location: Leg Pain Type: Chronic pain Pain Relieving Factors: recieves injections   Precautions/Restrictions  Precautions Precautions: Fall  Balance Screening Balance Screen Has the patient fallen in the past 6 months: Yes How many times?: 5 (in 1 month) Is the patient reluctant to leave their home because of a fear of falling? 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  Stands 12 hours a day unless on break. She has been working for 18 years Comments: She enjoys Scientist, research (physical sciences), Geneticist, molecular, she was able to walk for the MS walk last year  Sensation/Coordination/Flexibility/Functional Tests Functional Tests Functional Tests: Lower Extremity Functional Scale (LEFS): 25/80 Functional Tests: + Right Ely's test  Assessment RLE PROM (degrees) Right Knee Flexion: 108 (prone w/pain to hip and thigh) RLE Strength RLE Overall Strength Comments: Hip IR: 3-/5, Hip ER: 3/5 Right Hip Flexion: 3/5 Right Hip Extension: 3-/5 Right Hip ABduction: 3/5 Right Hip ADduction: 3/5 Right Knee Flexion: 3/5 Right Knee Extension: 4/5 LLE Strength Left Hip Flexion: 3+/5 Left Hip Extension: 3/5 Left Hip ABduction: 3/5 Left Hip ADduction: 3/5 Left Knee Flexion: 4/5 Left Knee Extension: 4/5 Palpation Palpation: pain and tenderness to Rt thigh and hip flexor  Mobility/Balance  Ambulation/Gait Ambulation/Gait: Yes Ambulation/Gait Assistance: 7: Independent Assistive device: None Gait Pattern: Lateral hip instability Static Standing Balance Static Standing - Comment/# of Minutes: each position held max 10 sec Single Leg Stance - Right Leg: 10 Single Leg Stance - Left Leg: 10 Tandem Stance - Right Leg: 10 Tandem Stance - Left Leg: 10 Rhomberg - Eyes Opened: 10 Rhomberg - Eyes Closed: 10   Exercise/Treatments Supine Bridges: 10 reps Straight Leg Raises: Both;5 reps Sidelying Hip ABduction: Both;10 reps Hip ADduction: Both;10 reps Prone  Hamstring Curl: 10 reps (Both) Hip Extension: 10 reps;Both Other Prone Exercises: Rt and Lt hip IR and ER x10   Physical Therapy Assessment and Plan PT Assessment and Plan Clinical Impression  Statement: Pt is a 62 year old female referred to PT for gait disorder with recent history of 5 falls in one month who presents with impairments listed below.  Pt likely has increased falls from significant weakness to BLE from falling causing increased leg pain causing further decreased mobility.  Pt will benefit from skilled therapeutic  intervention in order to improve on the following deficits: Abnormal gait;Decreased strength;Impaired perceived functional ability;Impaired flexibility;Pain;Decreased range of motion;Decreased balance Rehab Potential: Good PT Frequency: Min 3X/week PT Duration: 6 weeks PT Treatment/Interventions: Gait training;Stair training;Functional mobility training;Therapeutic activities;Therapeutic exercise;Balance training;Neuromuscular re-education;Patient/family education;Manual techniques;Modalities PT Plan: Continue with open and closed chain activities to improve LE strength, squats, heel and toe raises, standing knee flexion.  Progress with weights as necessary.  Progress to retro and tandem gait.     Goals Home Exercise Program Pt will Perform Home Exercise Program: Independently PT Goal: Perform Home Exercise Program - Progress: Goal set today PT Short Term Goals Time to Complete Short Term Goals: 3 weeks PT Short Term Goal 1: Pt will improve her BLE strength in order to ambulate independently with approrpriate gait mechanics to decrease risk of secondary injury. PT Short Term Goal 2: Pt will improve Rt quadriceps flexibility to 115 degrees of flexion for greater ease with squatting.  PT Short Term Goal 3: Pt will improve her BLE strength in order to ascend and descend 10 stairs w/1 handrail reciprocally in order to safely enter family and friends houses.  PT Long Term Goals Time to Complete Long Term Goals:  (6 weeks) PT Long Term Goal 1: Pt will improve LE strength WNL in order to tolerate standing for 12 hours with increased muscular fatigue to continue with work activities.  PT Long Term Goal 2: Pt will improve her dynamic balance and demonstrate independent tandem gait on dynamic surface to improve confidence.  Long Term Goal 3: Pt will improve her LEFS to 60/80 for improved percieved functional ability.   Problem List Patient Active Problem List  Diagnosis  . Leg weakness, bilateral     PT Plan of Care PT Home Exercise Plan: see scanned report PT Patient Instructions: importance HEP, POC, answered questions about diagnosis. Consulted and Agree with Plan of Care: Patient  Annett Fabian, PT 03/02/2013, 1:04 PM  Physician Documentation Your signature is required to indicate approval of the treatment plan as stated above.  Please sign and either send electronically or make a copy of this report for your files and return this physician signed original.   Please mark one 1.__approve of plan  2. ___approve of plan with the following conditions.   ______________________________                                                          _____________________ Physician Signature  Date  

## 2013-03-08 ENCOUNTER — Ambulatory Visit (HOSPITAL_COMMUNITY)
Admission: RE | Admit: 2013-03-08 | Discharge: 2013-03-08 | Disposition: A | Discharge status: Home / Self Care | Payer: BC Managed Care – PPO | Source: Ambulatory Visit | Attending: Neurology | Admitting: Neurology

## 2013-03-08 NOTE — Progress Notes (Signed)
Physical Therapy Treatment Patient Details  Name: SELIN EISLER MRN: [id number] Date of Birth: [birth year]  Today's Date: 03/08/2013 Time: 7829-5621 PT Time Calculation (min): 39 min  Visit#: 2 of 12  Re-eval: 04/01/13 Charges: Therex x 38'   Subjective: Symptoms/Limitations Symptoms: Pt reports HEP compliance. Pain Assessment Currently in Pain?: No/denies   Exercise/Treatments Aerobic Stationary Bike: NuStep x 8' L2 to improve strength Machines for Strengthening Cybex Knee Extension: 1.5 plate x 15 Cybex Knee Flexion: 3 plate x 15 Standing Heel Raises: 10 reps;Limitations Heel Raises Limitations: Toe raises x 10 Functional Squat: 10 reps Supine Bridges: 10 reps Straight Leg Raises: 10 reps;Both Sidelying Hip ABduction: 10 reps;Both Hip ADduction: 10 reps;Both Prone  Hip Extension: 10 reps;Limitations Hip Extension Limitations: both   Physical Therapy Assessment and Plan PT Assessment and Plan Clinical Impression Statement: Progressed pt to standing exercise with minimal difficulty. Pt requires multimodal cueing for proper form with functional squats. Pt displays visible muscle fatigue with supine SLR and side lying hip abduction in BLE. PT Plan: Continue with open and closed chain activities to improve LE strength.  Progress with weights as necessary.  Progress to retro and tandem gait.      Problem List Patient Active Problem List  Diagnosis  . Leg weakness, bilateral    PT - End of Session Activity Tolerance: Patient tolerated treatment well General Behavior During Session: [facility] for tasks performed Cognition: [facility] for tasks performed  Seth Bake, PTA  03/08/2013, 4:08 PM

## 2013-03-10 ENCOUNTER — Ambulatory Visit (HOSPITAL_COMMUNITY)
Admission: RE | Admit: 2013-03-10 | Discharge: 2013-03-10 | Disposition: A | Discharge status: Home / Self Care | Payer: BC Managed Care – PPO | Source: Ambulatory Visit | Attending: Neurology | Admitting: Neurology

## 2013-03-10 NOTE — Progress Notes (Signed)
Physical Therapy Treatment Patient Details  Name: Maria Fry MRN: [id number] Date of Birth: 10-20-1951  Today's Date: 03/10/2013 Time: 0865-7846 PT Time Calculation (min): 48 min Charge: Therex 38', NMR 8'  Visit#: 3 of 12  Re-eval: 04/01/13 Assessment Diagnosis: gait training/balance  Next MD Visit: Dr. Gerilyn Pilgrim 03/29/2013  Subjective: Symptoms/Limitations Symptoms: Pt reported pain free this session, reported compliance with HEP twice a day Pain Assessment Currently in Pain?: No/denies  Precautions/Restrictions  Precautions Precautions: Fall  Exercise/Treatments Aerobic Stationary Bike: NuStep x 10' L2 to improve strength and activity tolerance goal Machines for Strengthening Cybex Knee Extension: 1.5 plate x 15 Cybex Knee Flexion: 3 plate x 15 Standing Heel Raises: 10 reps;Limitations Heel Raises Limitations: Toe raises x 10 Functional Squat: 10 reps Rocker Board: 2 minutes;Limitations Rocker Board Limitations: R/L and F/B SLS: R 19", L 19" Other Standing Knee Exercises: tandem and retro tandem gait 1RT Supine Bridges: 15 reps Straight Leg Raises: Both;15 reps Sidelying Hip ABduction: Both;15 reps Hip ADduction: Both;15 reps Prone  Hip Extension: Both;15 reps      Physical Therapy Assessment and Plan PT Assessment and Plan Clinical Impression Statement: Added balance activities with min assistance for LOB episodes with cueing for spatial awareness with less assistance required following cues.  Pt able to complete therex with minimal difficulty, pt required multimodal cues for forn with functional squats.  Notable musculature fatigue with SLR and abduction BLE, no c/o pain through session. PT Plan: Continue with open and closed chain activities to improve LE strength.  Progress with weights as necessary.    Goals    Problem List Patient Active Problem List  Diagnosis  . Leg weakness, bilateral    PT - End of Session Activity Tolerance: Patient  tolerated treatment well;Patient limited by fatigue General Behavior During Session: [facility] for tasks performed Cognition: [facility] for tasks performed  GP    Juel Burrow 03/10/2013, 11:37 AM

## 2013-03-12 ENCOUNTER — Ambulatory Visit (HOSPITAL_COMMUNITY)
Admission: RE | Admit: 2013-03-12 | Discharge: 2013-03-12 | Disposition: A | Discharge status: Home / Self Care | Payer: BC Managed Care – PPO | Source: Ambulatory Visit | Attending: Neurology | Admitting: Neurology

## 2013-03-12 NOTE — Progress Notes (Signed)
Physical Therapy Treatment Patient Details  Name: Maria Fry MRN: [id number] Date of Birth: 1951-07-29  Today's Date: 03/12/2013 Time: 5784-6962 PT Time Calculation (min): 47 min Charge: NMR 23', Therex 23'  Visit#: 4 of 12  Re-eval: 04/01/13     Subjective: Symptoms/Limitations Symptoms: Pt reported she was sore following last session, pain free today.  Continuing HEP 2x a day Pain Assessment Currently in Pain?: No/denies  Objective:   Exercise/Treatments Aerobic Stationary Bike: NuStep x 10' L2 to improve strength and activity tolerance SPM goal >100 Machines for Strengthening Cybex Knee Extension: 2 PL x 15 Cybex Knee Flexion: 3 plate x 15 Standing Heel Raises: 15 reps Heel Raises Limitations: Toe raises x 15 Functional Squat: 10 reps Rocker Board: 2 minutes;Limitations Rocker Board Limitations: R/L and F/B SLS: L 30", R 25" max of 3 Other Standing Knee Exercises: tandem and retro tandem gait 1RT; tandem and retro gait 2RT Other Standing Knee Exercises: cone rotation on foam 1RT each UE; 1-15 on wall on foam 1RT Seated Other Seated Knee Exercises: 5 STS with no HHA  Physical Therapy Assessment and Plan PT Assessment and Plan Clinical Impression Statement: Progressed balance activities to dynamic surface to improve balance with min difficulty.  Pt with less cueing required to improve form with functional squats.  Visible musculature fatigue BLE, no reports of pain through session. PT Plan: Continue with current POC to improve balance and functional strength.  Next session begin vector stance.    Goals    Problem List Patient Active Problem List  Diagnosis  . Leg weakness, bilateral    PT - End of Session Equipment Utilized During Treatment: Gait belt Activity Tolerance: Patient tolerated treatment well;Patient limited by fatigue General Behavior During Session: [facility] for tasks performed Cognition: [facility] for tasks performed  GP    Juel Burrow 03/12/2013, 11:49 AM

## 2013-03-15 ENCOUNTER — Ambulatory Visit (HOSPITAL_COMMUNITY): Payer: BC Managed Care – PPO | Admitting: Physical Therapy

## 2013-03-17 ENCOUNTER — Ambulatory Visit (HOSPITAL_COMMUNITY)
Admission: RE | Admit: 2013-03-17 | Discharge: 2013-03-17 | Disposition: A | Discharge status: Home / Self Care | Payer: BC Managed Care – PPO | Source: Ambulatory Visit | Attending: Neurology | Admitting: Neurology

## 2013-03-17 NOTE — Progress Notes (Signed)
Physical Therapy Treatment Patient Details  Name: Maria Fry MRN: [id number] Date of Birth: [birth year]  Today's Date: 03/17/2013 Time: 4098-1191 PT Time Calculation (min): 45 min Charge: Therex 30', NMR 15'  Visit#: 5 of 12  Re-eval: 04/01/13    Authorization:    Authorization Time Period:    Authorization Visit#:   of     Subjective: Symptoms/Limitations Symptoms: Feeling good no reports of pain today. Pain Assessment Currently in Pain?: No/denies  Objective:   Exercise/Treatments Aerobic Stationary Bike: NuStep x 10' L2 to improve strength and activity tolerance SPM average 133 Machines for Strengthening Cybex Knee Extension: 2 PL x 15 Cybex Knee Flexion: 3.5 plate x 15 Standing Heel Raises: Limitations Heel Raises Limitations: Heel and toe walking 1RT Forward Lunges: Both;10 reps Lateral Step Up: Both;10 reps;Hand Hold: 0;Step Height: 4" Forward Step Up: Both;10 reps;Hand Hold: 0;Step Height: 4" SLS: L 27", R 30" Other Standing Knee Exercises: tandem and retro tandem gait on balance beam 2RT Other Standing Knee Exercises: 1-15 on wall on foam 1RT    Physical Therapy Assessment and Plan PT Assessment and Plan Clinical Impression Statement: Added functional strengthening activities including stair training and forward lunges to assist with functional tasks such as vaccuming.  Pt improving dynamic balance,no LOB episodes though session on balance beam today.  Pt limited by fatgiue at end of session, no reports of pain. PT Plan: Continue with current POC to improve balance and functional strength.  Next session begin vector stance.    Goals    Problem List Patient Active Problem List  Diagnosis  . Leg weakness, bilateral    PT - End of Session Equipment Utilized During Treatment: Gait belt Activity Tolerance: Patient tolerated treatment well;Patient limited by fatigue General Behavior During Therapy: [facility] for tasks assessed/performed Cognition: WFL for tasks  performed  GP    Juel Burrow 03/17/2013, 11:56 AM

## 2013-03-19 ENCOUNTER — Ambulatory Visit (HOSPITAL_COMMUNITY)
Admission: RE | Admit: 2013-03-19 | Discharge: 2013-03-19 | Disposition: A | Discharge status: Home / Self Care | Payer: BC Managed Care – PPO | Source: Ambulatory Visit | Attending: Neurology | Admitting: Neurology

## 2013-03-19 NOTE — Progress Notes (Signed)
Physical Therapy Treatment Patient Details  Name: Maria Fry MRN: [id number] Date of Birth: Sep 27, 1951  Today's Date: 03/19/2013 Time: 1100-1150 PT Time Calculation (min): 50 min Charge Therex 50'  Visit#: 6 of 12  Re-eval: 04/01/13   Subjective: Symptoms/Limitations Symptoms: Feeling good no reports of pain. Pain Assessment Currently in Pain?: No/denies  Objective:   Exercise/Treatments Aerobic Stationary Bike: NuStep x 10' L2 to improve strength and activity tolerance SPM >100 Tread Mill: Gait training x 6 minutes with . Machines for Strengthening Cybex Knee Extension: 2 PL x 15 Cybex Knee Flexion: 3.5 plate x 15 Standing Heel Raises: Limitations Heel Raises Limitations: Heel and toe walking 2RT Forward Lunges: Both;10 reps Lateral Step Up: Both;10 reps;Hand Hold: 0;Step Height: 4" Forward Step Up: Both;10 reps;Hand Hold: 0;Step Height: 4" Step Down: Both;10 reps;Hand Hold: 0;Step Height: 4" SLS: L 32", R 23" SLS with Vectors: 3x 5" Bil LE no HHA Other Standing Knee Exercises: tandem and retro gait on balance beam 2RT Other Standing Knee Exercises: 1-15 on wall on foam 1RT Seated Other Seated Knee Exercises: 5 STS with no HHA   Physical Therapy Assessment and Plan PT Assessment and Plan Clinical Impression Statement: Added vector stance for balance, hip strengthening and stability, pt able to demonstrate appropriate form and technique with no LOB episodes and no HHA.  Began gait training on TM to improve mechanics, pt limtied by fatigue at end of session.  No reports of pain. PT Plan: Continue with current POC to improve balance and functional strength.Begin wall squats and hurdles next session.    Goals    Problem List Patient Active Problem List  Diagnosis  . Leg weakness, bilateral    PT - End of Session Activity Tolerance: Patient tolerated treatment well;Patient limited by fatigue General Behavior During Therapy: [facility] for tasks  assessed/performed Cognition: WFL for tasks performed  GP    Juel Burrow 03/19/2013, 11:47 AM

## 2013-03-22 ENCOUNTER — Encounter (HOSPITAL_COMMUNITY): Payer: Self-pay | Admitting: Physical Therapy

## 2013-03-22 ENCOUNTER — Ambulatory Visit (HOSPITAL_COMMUNITY)
Admission: RE | Admit: 2013-03-22 | Discharge: 2013-03-22 | Disposition: A | Discharge status: Home / Self Care | Payer: BC Managed Care – PPO | Source: Ambulatory Visit | Attending: Neurology | Admitting: Neurology

## 2013-03-22 NOTE — Progress Notes (Signed)
Physical Therapy Treatment Patient Details  Name: Maria Fry MRN: [id number] Date of Birth: [birth year]  Today's Date: 03/22/2013 Time: 6213-0865 PT Time Calculation (min): 33 min Charges: 94' TE Visit#: 7 of 12  Re-eval: 04/01/13   Subjective: Symptoms/Limitations Symptoms: Pt reports that the second Sunday of this month she had to stand at her church service for an hour and had to come home and lay down the rest of the day. "I think you guys are looking for a miracle, which just isn't goiing to happen.  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 and have fought through a lot and now, I just don't think I cabn." Pain Assessment Currently in Pain?: Yes Pain Score:   7 (with stretching ) Pain Location: Leg  Precautions/Restrictions     Exercise/Treatments Aerobic Stationary Bike: NuStep x 10' L3, [place] #3 to improve strength and activity tolerance SPM >100 Machines for Strengthening Cybex Knee Extension: 2.5 PL x 10 reps Cybex Knee Flexion: 3.5 plate x 15 Standing Lateral Step Up: Both;Hand Hold: 0;Step Height: 6" Forward Step Up: Both;10 reps;Hand Hold: 0;Step Height: 6" Step Down: Both;10 reps;Hand Hold: 0;Step Height: 6" Rocker Board: 3 minutes Rocker Board Limitations: R/L and F/B SLS with Vectors: 3x 5" Bil LE no HHA Other Standing Knee Exercises: 1-15 on wall on foam 1x with each UE    Physical Therapy Assessment and Plan PT Assessment and Plan Clinical Impression Statement: Pt continues to have greatest limitations with pain to her anterior quadriceps muscle witn manual stretching.  Continued to progress strength with increased weight and difficulty with activities without extra pain.  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  Pt is possibly willing to focus on this next session.  PT Plan: Manual therapy for RLE anterior hip if pt is able to  tolerate,  If not continue with LE strengthening and balance activities.     Goals Home Exercise Program Pt will Perform Home Exercise Program: Independently PT Goal: Perform Home Exercise Program - Progress: Met PT Short Term Goals Time to Complete Short Term Goals: 3 weeks PT Short Term Goal 1: Pt will improve her BLE strength in order to ambulate independently with approrpriate gait mechanics to decrease risk of secondary injury. (walking intermittently without her cane) PT Short Term Goal 2: Pt will improve Rt quadriceps flexibility to 115 degrees of flexion for greater ease with squatting.  PT Short Term Goal 3: Pt will improve her BLE strength in order to ascend and descend 10 stairs w/1 handrail reciprocally in order to safely enter family and friends houses.  PT Short Term Goal 3 - Progress: Progressing toward goal PT Long Term Goals Time to Complete Long Term Goals:  (6 weeks) PT Long Term Goal 1: Pt will improve LE strength WNL in order to tolerate standing for 12 hours with increased muscular fatigue to continue with work activities.  PT Long Term Goal 1 - Progress: Progressing toward goal PT Long Term Goal 2: Pt will improve her dynamic balance and demonstrate independent tandem gait on dynamic surface to improve confidence.  PT Long Term Goal 2 - Progress: Progressing toward goal Long Term Goal 3: Pt will improve her LEFS to 60/80 for improved percieved functional ability.  Long Term Goal 3 Progress: Progressing toward goal  Problem List Patient Active Problem List  Diagnosis  . Leg weakness, bilateral    PT -  End of Session Activity Tolerance: Patient tolerated treatment well;Patient limited by fatigue General Behavior During Therapy: [facility] for tasks assessed/performed Cognition: WFL for tasks performed  GP    Arvil Utz 03/22/2013, 11:37 AM

## 2013-03-24 ENCOUNTER — Ambulatory Visit (HOSPITAL_COMMUNITY)
Admission: RE | Admit: 2013-03-24 | Discharge: 2013-03-24 | Disposition: A | Discharge status: Home / Self Care | Payer: BC Managed Care – PPO | Source: Ambulatory Visit | Attending: Neurology | Admitting: Neurology

## 2013-03-24 NOTE — Progress Notes (Signed)
Physical Therapy Treatment Patient Details  Name: Maria Fry MRN: [id number] Date of Birth: 1951/11/30  Today's Date: 03/24/2013 Time: 1102-1140 PT Time Calculation (min): 38 min  Visit#: 8 of 12  Re-eval: 04/01/13 Charges: TE x 23' NMR x 15'     Subjective: Symptoms/Limitations Symptoms: Pt states that when she loses her balance it comes on unexpectedly. No certain movement or activity causes it.  Pain Assessment Currently in Pain?: No/denies   Exercise/Treatments Aerobic Stationary Bike: NuStep x 10' L3, [place] #3 to improve strength and activity tolerance SPM >100 Machines for Strengthening Cybex Knee Extension: 3 PL x 10 reps Cybex Knee Flexion: 4 plate x 10 Standing Lateral Step Up: Both;Step Height: 6";10 reps;Hand Hold: 1 Forward Step Up: Both;10 reps;Hand Hold: 0;Step Height: 6" Step Down: Both;10 reps;Hand Hold: 0;Step Height: 6" Rocker Board: 3 minutes Rocker Board Limitations: R/L and F/B SLS with Vectors: 5x 5" Bil LE no HHA Other Standing Knee Exercises: 1-15 on wall on foam 1x with each UE  Physical Therapy Assessment and Plan PT Assessment and Plan Clinical Impression Statement: Pt completes therex and NMR exercises well with minimal difficulty. Pt complains of muscles fatigue with SLS activities. Pt educated on the benefits of physical therapy for balance disorders. Pt is without complaint of pain throughout session.  PT Plan: Continue with LE strengthening and balance activities per PT POC.     Problem List Patient Active Problem List  Diagnosis  . Leg weakness, bilateral    PT - End of Session Activity Tolerance: Patient tolerated treatment well;Patient limited by fatigue General Behavior During Therapy: [facility] for tasks assessed/performed Cognition: WFL for tasks performed  Seth Bake, PTA  03/24/2013, 12:24 PM

## 2013-03-26 ENCOUNTER — Ambulatory Visit (HOSPITAL_COMMUNITY)
Admission: RE | Admit: 2013-03-26 | Discharge: 2013-03-26 | Disposition: A | Discharge status: Home / Self Care | Payer: BC Managed Care – PPO | Source: Ambulatory Visit | Attending: Neurology | Admitting: Neurology

## 2013-03-26 NOTE — Progress Notes (Signed)
Physical Therapy Treatment Patient Details  Name: Maria Fry MRN: [id number] Date of Birth: [birth year]  Today's Date: 03/26/2013 Time: 4098-1191 PT Time Calculation (min): 52 min Charge: NMR 23', Therex 27'  Visit#: 9 of 12  Re-eval: 04/01/13    Subjective: Symptoms/Limitations Symptoms: Pt reported she is feeling good today, no c/o pain today.  Pt compains of decreased activity tolerance. Pain Assessment Currently in Pain?: No/denies  Objective:   Exercise/Treatments Aerobic Stationary Bike: NuStep x 10' L3, Hills #3 to improve strength and activity tolerance SPM >100 Machines for Strengthening Cybex Knee Extension: 3 PL x 15 reps Cybex Knee Flexion: 4 plate x 15 Standing Heel Raises: Limitations Heel Raises Limitations: Heel and toe walking 2RT Lateral Step Up: Both;Step Height: 6";10 reps;Hand Hold: 1 Forward Step Up: Both;10 reps;Hand Hold: 0;Step Height: 6" Step Down: Both;10 reps;Hand Hold: 0;Step Height: 6" SLS: R 26" L 30" max of 3 SLS with Vectors: 5x 5" Bil LE no HHA Other Standing Knee Exercises: tandem and retro gait on balance beam 2RT Other Standing Knee Exercises: cone rotation f/b and r/l Bil UE;  stepping over hurdles 6 and 12in 2RT    Physical Therapy Assessment and Plan PT Assessment and Plan Clinical Impression Statement: Progressed balance activities to improve pt's balance reaching outside BOS and stepping over hurdles.  Pt limited by fatigue with activities, no c/o pain through session. PT Plan: Continue with LE strengthening and balance activities per PT POC.    Goals    Problem List Patient Active Problem List  Diagnosis  . Leg weakness, bilateral    PT - End of Session Equipment Utilized During Treatment: Gait belt Activity Tolerance: Patient tolerated treatment well;Patient limited by fatigue General Behavior During Therapy: [facility] for tasks assessed/performed Cognition: WFL for tasks performed  GP    Juel Burrow 03/26/2013, 11:39 AM

## 2013-03-29 ENCOUNTER — Telehealth (HOSPITAL_COMMUNITY): Payer: Self-pay

## 2013-03-29 ENCOUNTER — Ambulatory Visit (HOSPITAL_COMMUNITY): Payer: BC Managed Care – PPO | Admitting: Physical Therapy

## 2013-03-31 ENCOUNTER — Ambulatory Visit (HOSPITAL_COMMUNITY)
Admission: RE | Admit: 2013-03-31 | Discharge: 2013-03-31 | Disposition: A | Discharge status: Home / Self Care | Payer: BC Managed Care – PPO | Source: Ambulatory Visit | Attending: Neurology | Admitting: Neurology

## 2013-03-31 NOTE — Progress Notes (Signed)
Physical Therapy Treatment Patient Details  Name: Maria Fry MRN: [id number] Date of Birth: 05-22-1951  Today's Date: 03/31/2013 Time: 6962-9528 PT Time Calculation (min): 38 min Visit#: 10 of 12  Re-eval: 04/01/13 Charges:  therex 38'  Subjective: Symptoms/Limitations Symptoms: Pt. reports painfree today.  Pt requests to end session early due to having a funeral to go to.   Pain Assessment Currently in Pain?: No/denies   Exercise/Treatments Aerobic Stationary Bike: NuStep x 10' L3, Hills #3 to improve strength and activity tolerance SPM >100 Machines for Strengthening Cybex Knee Extension: 3 PL x 20 Cybex Knee Flexion: 4 PL x 20 Standing Lateral Step Up: Both;Step Height: 6";10 reps;Hand Hold: 1 Forward Step Up: Both;10 reps;Hand Hold: 0;Step Height: 6" Step Down: Both;10 reps;Hand Hold: 0;Step Height: 6" SLS with Vectors: 5x 5" Bil LE no HHA Other Standing Knee Exercises: tandem and retro gait on balance beam 2RT Other Standing Knee Exercises: cone rotation f/b and r/l Bil UE;  stepping over hurdles 6 and 12in 2RT (did not do today due to timie)    Physical Therapy Assessment and Plan PT Assessment and Plan Clinical Impression Statement: Pt without need of rest breaks today during session.  Pt. without LOB during balance activities.  Did not complete hurdles and cones today due to pt. having to leave early.  Continued difficulty keeping stability with vector stance.  PT Plan: Continue with LE strengthening and balance activities per PT POC.  Add manual techniques to anterior hip if pain returns or is needed per PT POC.     PT - End of Session Equipment Utilized During Treatment: Gait belt Activity Tolerance: Patient tolerated treatment well;Patient limited by fatigue General Behavior During Therapy: [facility] for tasks assessed/performed Cognition: WFL for tasks performed   Lurena Nida, PTA/CLT 03/31/2013, 10:30 AM

## 2013-04-05 ENCOUNTER — Ambulatory Visit (HOSPITAL_COMMUNITY)
Admission: RE | Admit: 2013-04-05 | Discharge: 2013-04-05 | Disposition: A | Discharge status: Home / Self Care | Payer: BC Managed Care – PPO | Source: Ambulatory Visit | Attending: Neurology | Admitting: Neurology

## 2013-04-05 DIAGNOSIS — M6281 Muscle weakness (generalized): Secondary | ICD-10-CM | POA: Insufficient documentation

## 2013-04-05 DIAGNOSIS — IMO0001 Reserved for inherently not codable concepts without codable children: Secondary | ICD-10-CM | POA: Insufficient documentation

## 2013-04-05 NOTE — Evaluation (Addendum)
Physical Therapy Re-Evaluation  Patient Details  Name: Maria Fry MRN: [id number] Date of Birth: May 12, 1951  Today's Date: 04/05/2013 Time: 1105-1150 PT Time Calculation (min): 45 min Charges: 25' Manual , 18' self care            Visit#: 10 of 28  Re-eval: 05/05/13 Assessment Diagnosis: gait training/balance  Next MD Visit: Dr. Gerilyn Pilgrim 03/29/2013  Subjective Symptoms/Limitations Symptoms: Pt reports that her leg is always in pain, reports that she feels that her balance has moderatly improved, but feels her greatest limitation is standing on her leg more than an hour and she has intense pain, swelling and her leg gives way.  How long can you stand comfortably?: 1 hour, due to swelling and pain in the legs  Pain Assessment Currently in Pain?: Yes Pain Score:   6 Pain Location: Leg Pain Type: Chronic pain  Sensation/Coordination/Flexibility/Functional Tests Functional Tests Functional Tests: + Right Ely's test  RLE Strength Right Hip Flexion: 3+/5 (was 3/5) Right Hip Extension: 3/5 (was 3-/5) Right Hip ABduction: 3+/5 (was 3/5) Right Hip ADduction: 3+/5 (was 3/5) Right Knee Flexion: 3+/5 (was 3/5) Right Knee Extension: 4/5 (was 4/5) LLE Strength Left Hip Flexion: 4/5 (was 3+/5) Left Hip Extension: 4/5 (was 3/5) Left Hip ABduction: 4/5 (was 3/5) Left Hip ADduction: 4/5 (was 3/5) Left Knee Flexion: 5/5 (was 4/5) Left Knee Extension: 5/5 (was 4/5)  Mobility/Balance  Ambulation/Gait Assistive device: Straight cane Gait Pattern: Within Functional Limits   Exercise/Treatments Manual Therapy Manual Therapy: Massage Massage: soft tissue massage to rt adductors, hip flexors and extenal obturator internus  Physical Therapy Assessment and Plan PT Assessment and Plan Clinical Impression Statement: Maria Fry has attended 10 OP PT visits to address gait deformities and balance with the following findings: she continues to ambulate with a SPC in outdoor environments and  independent indoor environment, overall has been progressing well with balance activities, however due to her significant pain and irritation with myalgia prosthetica,pt continues to demonstrate good days and bad days. Had an in depth conversiton about manual therapy and benefits it may provide patient releif and improve alignment and strength to improve her gait and QOL. At this time feel patient wound continue to benefit to address remaining pain to her BLE, decreae fasical restrictions, improved knee AROM and flexibility in order to pt to have improved balance and decreased risk of falls.  Pt will benefit from skilled therapeutic intervention in order to improve on the following deficits: Pain;Decreased strength;Impaired flexibility;Decreased balance;Increased fascial restricitons;Increased muscle spasms Rehab Potential: Good PT Frequency: Min 3X/week PT Duration: 4 weeks PT Treatment/Interventions: Manual techniques;Modalities;Balance training;Neuromuscular re-education;Therapeutic exercise;Gait training;Patient/family education PT Plan: Check sacral and iliac alignment and correct as needed, continue with gentle manual therapy to decreased adductor, hip flexor, groin and gluteal pain.  Continue with LE strengthening and core exercises as able to improve hip strength to improve balance and cooridation.     Goals Home Exercise Program Pt will Perform Home Exercise Program: Independently PT Goal: Perform Home Exercise Program - Progress: Met PT Short Term Goals Time to Complete Short Term Goals: 3 weeks PT Short Term Goal 1: Pt will improve her BLE strength in order to ambulate independently with approrpriate gait mechanics to decrease risk of secondary injury. (walking intermittently without her cane) PT Short Term Goal 1 - Progress: Progressing toward goal PT Short Term Goal 2: Pt will improve Rt quadriceps flexibility to 115 degrees of flexion for greater ease with squatting.  PT Short Term  Goal 2 -  Progress: Progressing toward goal PT Short Term Goal 3: Pt will improve her BLE strength in order to ascend and descend 10 stairs w/1 handrail reciprocally in order to safely enter family and friends houses.  PT Short Term Goal 3 - Progress: Met PT Long Term Goals Time to Complete Long Term Goals:  (6 weeks) PT Long Term Goal 1: Pt will improve LE strength WNL in order to tolerate standing for 12 hours with increased muscular fatigue to continue with work activities.  PT Long Term Goal 1 - Progress: Not met PT Long Term Goal 2: Pt will improve her dynamic balance and demonstrate independent tandem gait on dynamic surface to improve confidence.  PT Long Term Goal 2 - Progress: Met Long Term Goal 3: Pt will improve her LEFS to 60/80 for improved percieved functional ability.  Long Term Goal 3 Progress: Progressing toward goal  Problem List Patient Active Problem List   Diagnosis Date Noted  . Leg weakness, bilateral 03/02/2013    PT - End of Session Equipment Utilized During Treatment: Gait belt Activity Tolerance: Patient tolerated treatment well;Patient limited by fatigue General Behavior During Therapy: [facility] for tasks assessed/performed Cognition: WFL for tasks performed PT Plan of Care PT Patient Instructions: Explained manual techniques to decrease pain and improve fascial mobility, Answered questions about manual therapy.  Consulted and Agree with Plan of Care: Patient  Annett Fabian, MPT, ATC 04/05/2013, 5:39 PM  Physician Documentation Your signature is required to indicate approval of the treatment plan as stated above.  Please sign and either send electronically or make a copy of this report for your files and return this physician signed original.   Please mark one 1.__approve of plan  2. ___approve of plan with the following conditions.   ______________________________                                                          _____________________ Physician Signature                                                                                                              Date

## 2013-04-07 ENCOUNTER — Ambulatory Visit (HOSPITAL_COMMUNITY)
Admission: RE | Admit: 2013-04-07 | Discharge: 2013-04-07 | Disposition: A | Discharge status: Home / Self Care | Payer: BC Managed Care – PPO | Source: Ambulatory Visit | Attending: Neurology | Admitting: Neurology

## 2013-04-07 NOTE — Progress Notes (Signed)
Physical Therapy Discharge Patient Details  Name: CHERRISH VITALI MRN: [id number] Date of Birth: 1951-07-12  Today's Date: 04/07/2013 Time: 5784-6962 PT Time Calculation (min): 18 min Charges: 18' self care Visit#: 12 of 28  Re-eval: 05/05/13 Assessment Diagnosis: gait training/balance  Next MD Visit: Dr. Gerilyn Pilgrim 04/28/2013  Subjective: Symptoms/Limitations Symptoms: Pt reports that she had a significant increase in pain after gentle manual therapy.  She is independent with her HEP.  Overall she is frusturated wih her pain in her legs which starts after 1 hour.  How long can you stand comfortably?: 1 hour has increased pain and swelling in her legs.  Pain Assessment Currently in Pain?: Yes Pain Score:   7 Pain Location: Leg Pain Type: Chronic pain  Precautions/Restrictions     Physical Therapy Assessment and Plan PT Assessment and Plan Clinical Impression Statement: Ms. Glaza comes in today and has significant pain after gentle manual therapy.  At this time due to lack of gains with therapy in strength, decreased pain and improved ability, will d/c patient at this time.   Pt will benefit from skilled therapeutic intervention in order to improve on the following deficits: Pain;Decreased strength;Impaired flexibility;Decreased balance;Increased fascial restricitons;Increased muscle spasms PT Plan: D/C    Goals    Problem List Patient Active Problem List   Diagnosis Date Noted  . Leg weakness, bilateral 03/02/2013    PT Plan of Care PT Patient Instructions: Discussed HEP to continue with at home. Answered remaining questions and listened to concerns.  Consulted and Agree with Plan of Care: Patient  Lynwood Kubisiak, MPT, ATC 04/07/2013, 11:34 AM

## 2013-04-08 ENCOUNTER — Ambulatory Visit (HOSPITAL_COMMUNITY): Payer: BC Managed Care – PPO

## 2013-04-09 ENCOUNTER — Ambulatory Visit (HOSPITAL_COMMUNITY): Payer: BC Managed Care – PPO

## 2013-04-12 ENCOUNTER — Ambulatory Visit (HOSPITAL_COMMUNITY): Payer: BC Managed Care – PPO | Admitting: Physical Therapy

## 2013-04-14 ENCOUNTER — Ambulatory Visit (HOSPITAL_COMMUNITY): Payer: BC Managed Care – PPO | Admitting: Physical Therapy

## 2013-04-16 ENCOUNTER — Ambulatory Visit (HOSPITAL_COMMUNITY): Payer: BC Managed Care – PPO | Admitting: Physical Therapy

## 2013-04-27 ENCOUNTER — Other Ambulatory Visit (HOSPITAL_COMMUNITY): Payer: Self-pay | Admitting: Family Medicine

## 2013-04-27 DIAGNOSIS — E0590 Thyrotoxicosis, unspecified without thyrotoxic crisis or storm: Secondary | ICD-10-CM

## 2013-04-28 ENCOUNTER — Encounter (HOSPITAL_COMMUNITY)
Admission: RE | Admit: 2013-04-28 | Discharge: 2013-04-28 | Disposition: A | Discharge status: Home / Self Care | Payer: BC Managed Care – PPO | Source: Ambulatory Visit | Attending: Family Medicine | Admitting: Family Medicine

## 2013-04-28 ENCOUNTER — Encounter (HOSPITAL_COMMUNITY): Payer: Self-pay

## 2013-04-28 DIAGNOSIS — E0590 Thyrotoxicosis, unspecified without thyrotoxic crisis or storm: Secondary | ICD-10-CM

## 2013-04-28 MED ORDER — SODIUM IODIDE I 131 CAPSULE
14.0000 | Freq: Once | INTRAVENOUS | Status: AC | PRN
  Administered 2013-04-28: 14 via ORAL

## 2013-04-29 ENCOUNTER — Encounter (HOSPITAL_COMMUNITY)
Admission: RE | Admit: 2013-04-29 | Discharge: 2013-04-29 | Disposition: A | Discharge status: Home / Self Care | Payer: BC Managed Care – PPO | Source: Ambulatory Visit | Attending: Family Medicine | Admitting: Family Medicine

## 2013-04-29 DIAGNOSIS — E0590 Thyrotoxicosis, unspecified without thyrotoxic crisis or storm: Secondary | ICD-10-CM | POA: Insufficient documentation

## 2013-04-29 MED ORDER — SODIUM PERTECHNETATE TC 99M INJECTION
10.0000 | Freq: Once | INTRAVENOUS | Status: AC | PRN
  Administered 2013-04-29: 10 via INTRAVENOUS

## 2013-04-30 ENCOUNTER — Other Ambulatory Visit (HOSPITAL_COMMUNITY): Payer: Self-pay | Admitting: "Endocrinology

## 2013-04-30 DIAGNOSIS — E041 Nontoxic single thyroid nodule: Secondary | ICD-10-CM

## 2013-05-04 ENCOUNTER — Ambulatory Visit (HOSPITAL_COMMUNITY)
Admission: RE | Admit: 2013-05-04 | Discharge: 2013-05-04 | Disposition: A | Discharge status: Home / Self Care | Payer: BC Managed Care – PPO | Source: Ambulatory Visit | Attending: "Endocrinology | Admitting: "Endocrinology

## 2013-05-04 DIAGNOSIS — E041 Nontoxic single thyroid nodule: Secondary | ICD-10-CM | POA: Insufficient documentation

## 2013-05-07 ENCOUNTER — Other Ambulatory Visit (HOSPITAL_COMMUNITY): Payer: Self-pay | Admitting: "Endocrinology

## 2013-05-07 DIAGNOSIS — E042 Nontoxic multinodular goiter: Secondary | ICD-10-CM

## 2013-05-13 ENCOUNTER — Other Ambulatory Visit (HOSPITAL_COMMUNITY): Payer: Self-pay | Admitting: "Endocrinology

## 2013-05-13 ENCOUNTER — Ambulatory Visit (HOSPITAL_COMMUNITY)
Admission: RE | Admit: 2013-05-13 | Discharge: 2013-05-13 | Disposition: A | Discharge status: Home / Self Care | Payer: BC Managed Care – PPO | Source: Ambulatory Visit | Attending: "Endocrinology | Admitting: "Endocrinology

## 2013-05-13 DIAGNOSIS — E042 Nontoxic multinodular goiter: Secondary | ICD-10-CM

## 2013-05-13 DIAGNOSIS — E049 Nontoxic goiter, unspecified: Secondary | ICD-10-CM | POA: Insufficient documentation

## 2013-05-13 DIAGNOSIS — E041 Nontoxic single thyroid nodule: Secondary | ICD-10-CM | POA: Insufficient documentation

## 2013-05-13 MED ORDER — LIDOCAINE HCL (PF) 2 % IJ SOLN
INTRAMUSCULAR | Status: AC
  Filled 2013-05-13: qty 20

## 2013-05-13 NOTE — Procedures (Signed)
PreOperative Dx: Bilateral thyroid nodules Postoperative Dx: Bilateral thyroid nodules  Procedure:   US guided FNA of THREE thyroid nodules Radiologist:  Tyron Russell Anesthesia:  3 ml of 2% lidocaine Specimen:  FNA x 3 of each of the 3 targeted nodules located at mid right, inferior right, and inferior left lobes  EBL:   < 1 ml Complications: Small bilateral soft tissue hematomas anterior to the thyroid lobes bilaterally, 3.6 x 1.2 x 3.3 cm on the Left and 3.6 x 1.2 x 2.8 cm on the Right

## 2013-05-13 NOTE — Progress Notes (Addendum)
Dr. Tyron Russell entered room at [phone], procedure explained to patient, consent signed, timeout performed at [phone]; Procedure site prepped with chloraprep 0938 per Dr.Boles.  Procedure start at 0940.  2%Lidocaine local injected per Dr. Tyron Russell 2ml total injected.  Belenda Cruise, vascular sonographer and Dondra Prader, [place] tech present during procedure, labeled and prepared specimens.  Additional 3ml 2% Lidocaine injected per Dr.Boles. Procedure end at 0958, 4x4, tape for dressing, icepack applied. Patient tolerated procedure well, Dr. Tyron Russell noted hematoma (bilateral), pressure applied.  Discharged patient with instructions given to her. Ambulated.

## 2013-05-14 ENCOUNTER — Emergency Department (HOSPITAL_COMMUNITY)
Admission: EM | Admit: 2013-05-14 | Discharge: 2013-05-14 | Disposition: A | Discharge status: Home / Self Care | Payer: BC Managed Care – PPO | Attending: Emergency Medicine | Admitting: Emergency Medicine

## 2013-05-14 ENCOUNTER — Encounter (HOSPITAL_COMMUNITY): Payer: Self-pay | Admitting: *Deleted

## 2013-05-14 DIAGNOSIS — Z8639 Personal history of other endocrine, nutritional and metabolic disease: Secondary | ICD-10-CM | POA: Insufficient documentation

## 2013-05-14 DIAGNOSIS — Z8739 Personal history of other diseases of the musculoskeletal system and connective tissue: Secondary | ICD-10-CM | POA: Insufficient documentation

## 2013-05-14 DIAGNOSIS — R110 Nausea: Secondary | ICD-10-CM | POA: Insufficient documentation

## 2013-05-14 DIAGNOSIS — Z79899 Other long term (current) drug therapy: Secondary | ICD-10-CM | POA: Insufficient documentation

## 2013-05-14 DIAGNOSIS — Z862 Personal history of diseases of the blood and blood-forming organs and certain disorders involving the immune mechanism: Secondary | ICD-10-CM | POA: Insufficient documentation

## 2013-05-14 DIAGNOSIS — I10 Essential (primary) hypertension: Secondary | ICD-10-CM | POA: Insufficient documentation

## 2013-05-14 MED ORDER — ONDANSETRON 4 MG PO TBDP
4.0000 mg | ORAL_TABLET | Freq: Once | ORAL | Status: AC
  Administered 2013-05-14: 4 mg via ORAL
  Filled 2013-05-14: qty 1

## 2013-05-14 MED ORDER — ONDANSETRON 8 MG PO TBDP: 8.0000 mg | ORAL_TABLET | Freq: Once | ORAL | Status: DC

## 2013-05-14 MED ORDER — ONDANSETRON 4 MG PO TBDP: 4.0000 mg | ORAL_TABLET | Freq: Three times a day (TID) | ORAL | Status: DC | PRN

## 2013-05-14 NOTE — ED Notes (Addendum)
Pt reports waking up about 3 hours ago feeling very nauseated.  No vomiting.  Denies abdominal pain.  Reports one episode of diarrhea following drinking some soda to settle her stomach.

## 2013-05-14 NOTE — ED Notes (Signed)
Pt reporting improvement in nausea.  Provided with p.o fluids and encouraged to take small sips.

## 2013-05-14 NOTE — ED Provider Notes (Signed)
History     CSN: [id number]  Arrival date & time 05/14/13  4098   First MD Initiated Contact with Patient 05/14/13 0402      Chief Complaint  Patient presents with  . Nausea    (Consider location/radiation/quality/duration/timing/severity/associated sxs/prior treatment) HPI HPI Comments: Maria Fry is a 62 y.o. female who presents to the Emergency Department complaining of waking and feeling nauseated. She had biopsies of thyroid nodules done by radiology yesterday. Did not have any trouble after the procedure. Has minimal pain at the site.   PCP Dr. Renard Matter  Past Medical History  Diagnosis Date  . Hypertension   . Hip pain   . Thyroid disease     Past Surgical History  Procedure Laterality Date  . Tubal ligation    . Abdominal hysterectomy    . Cholecystectomy    . Shoulder arthroscopy    . Heel spurs      History reviewed. No pertinent family history.  History  Substance Use Topics  . Smoking status: Never Smoker   . Smokeless tobacco: Not on file  . Alcohol Use: No    OB History   Grav Para Term Preterm Abortions TAB SAB Ect Mult Living                  Review of Systems  Constitutional: Negative for fever.       10 Systems reviewed and are negative for acute change except as noted in the HPI.  HENT: Negative for congestion.        Bandage to neck  Eyes: Negative for discharge and redness.  Respiratory: Negative for cough and shortness of breath.   Cardiovascular: Negative for chest pain.  Gastrointestinal: Negative for vomiting and abdominal pain.  Musculoskeletal: Negative for back pain.  Skin: Negative for rash.  Neurological: Negative for syncope, numbness and headaches.  Psychiatric/Behavioral:       No behavior change.    Allergies  Demerol and Lodine  Home Medications   Current Outpatient Rx  Name  Route  Sig  Dispense  Refill  . gabapentin (NEURONTIN) 100 MG capsule   Oral   Take 300 mg by mouth 3 (three) times daily.          . propranolol (INDERAL) 20 MG tablet   Oral   Take 20 mg by mouth 2 (two) times daily.         . Vitamin D, Ergocalciferol, (DRISDOL) 50000 UNITS CAPS   Oral   Take 50,000 Units by mouth every 7 (seven) days.         Marland Kitchen ibuprofen (ADVIL,MOTRIN) 200 MG tablet   Oral   Take 400 mg by mouth every 4 (four) hours as needed for pain.         Marland Kitchen olmesartan-hydrochlorothiazide (BENICAR HCT) 20-12.5 MG per tablet   Oral   Take 1 tablet by mouth every morning.          . ondansetron (ZOFRAN) 4 MG tablet   Oral   Take 1 tablet (4 mg total) by mouth every 6 (six) hours.   12 tablet   0   . traMADol (ULTRAM) 50 MG tablet   Oral   Take 1 tablet (50 mg total) by mouth every 6 (six) hours as needed for pain.   15 tablet   0     BP 138/84  Pulse 63  Temp(Src) 98.1 F (36.7 C) (Oral)  Resp 18  Ht 5\' 5"  (1.651 m)  Wt 177  lb (80.287 kg)  BMI 29.45 kg/m2  SpO2 98%  Physical Exam  Nursing note and vitals reviewed. Constitutional: She appears well-developed and well-nourished.  Awake, alert, nontoxic appearance.  HENT:  Head: Normocephalic and atraumatic.  Eyes: EOM are normal. Pupils are equal, round, and reactive to light.  Neck:  Mild tenderness to palpation over site where biopsies were performed. Bandage to site.  Cardiovascular: Normal rate and intact distal pulses.   Pulmonary/Chest: Effort normal and breath sounds normal. She exhibits no tenderness.  Abdominal: Soft. There is no tenderness. There is no rebound.  Musculoskeletal: She exhibits no tenderness.  Baseline ROM, no obvious new focal weakness.  Neurological:  Mental status and motor strength appears baseline for patient and situation.  Skin: No rash noted.  Psychiatric: She has a normal mood and affect.    ED Course  Procedures (including critical care time)    0527 Nausea is relieved. MDM  Patient with nausea given zofran with relief. Pt stable in ED with no significant deterioration in  condition.The patient appears reasonably screened and/or stabilized for discharge and I doubt any other medical condition or other [facility] requiring further screening, evaluation, or treatment in the ED at this time prior to discharge.  MDM Reviewed: nursing note and vitals           Nicoletta Dress. Colon Branch, MD 05/14/13 [phone]

## 2014-01-03 ENCOUNTER — Ambulatory Visit (HOSPITAL_COMMUNITY)
Admission: RE | Admit: 2014-01-03 | Discharge: 2014-01-03 | Disposition: A | Discharge status: Home / Self Care | Payer: 59 | Source: Ambulatory Visit | Attending: Family Medicine | Admitting: Family Medicine

## 2014-01-03 ENCOUNTER — Other Ambulatory Visit (HOSPITAL_COMMUNITY): Payer: Self-pay | Admitting: Family Medicine

## 2014-01-03 DIAGNOSIS — M1710 Unilateral primary osteoarthritis, unspecified knee: Secondary | ICD-10-CM | POA: Insufficient documentation

## 2014-01-03 DIAGNOSIS — IMO0002 Reserved for concepts with insufficient information to code with codable children: Secondary | ICD-10-CM | POA: Insufficient documentation

## 2014-01-03 DIAGNOSIS — R52 Pain, unspecified: Secondary | ICD-10-CM

## 2014-01-03 DIAGNOSIS — M25569 Pain in unspecified knee: Secondary | ICD-10-CM | POA: Insufficient documentation

## 2014-01-14 ENCOUNTER — Other Ambulatory Visit (HOSPITAL_COMMUNITY): Payer: Self-pay | Admitting: General Surgery

## 2014-01-14 DIAGNOSIS — R110 Nausea: Secondary | ICD-10-CM

## 2014-01-14 DIAGNOSIS — R519 Headache, unspecified: Secondary | ICD-10-CM

## 2014-01-14 DIAGNOSIS — R030 Elevated blood-pressure reading, without diagnosis of hypertension: Secondary | ICD-10-CM

## 2014-01-14 DIAGNOSIS — IMO0001 Reserved for inherently not codable concepts without codable children: Secondary | ICD-10-CM

## 2014-01-14 DIAGNOSIS — R51 Headache: Principal | ICD-10-CM

## 2014-01-19 ENCOUNTER — Ambulatory Visit (HOSPITAL_COMMUNITY): Payer: 59

## 2014-01-25 ENCOUNTER — Ambulatory Visit (HOSPITAL_COMMUNITY): Payer: 59

## 2014-01-28 ENCOUNTER — Ambulatory Visit (HOSPITAL_COMMUNITY)
Admission: RE | Admit: 2014-01-28 | Discharge: 2014-01-28 | Disposition: A | Discharge status: Home / Self Care | Payer: 59 | Source: Ambulatory Visit | Attending: General Surgery | Admitting: General Surgery

## 2014-01-28 DIAGNOSIS — R519 Headache, unspecified: Secondary | ICD-10-CM

## 2014-01-28 DIAGNOSIS — R030 Elevated blood-pressure reading, without diagnosis of hypertension: Secondary | ICD-10-CM | POA: Insufficient documentation

## 2014-01-28 DIAGNOSIS — IMO0001 Reserved for inherently not codable concepts without codable children: Secondary | ICD-10-CM

## 2014-01-28 DIAGNOSIS — R51 Headache: Secondary | ICD-10-CM | POA: Insufficient documentation

## 2014-01-28 DIAGNOSIS — R110 Nausea: Secondary | ICD-10-CM | POA: Insufficient documentation

## 2014-01-28 LAB — POCT I-STAT CREATININE: Creatinine, Ser: 0.9 mg/dL (ref 0.50–1.10)

## 2014-01-28 MED ORDER — IOHEXOL 300 MG/ML  SOLN
70.0000 mL | Freq: Once | INTRAMUSCULAR | Status: AC | PRN
  Administered 2014-01-28: 70 mL via INTRAVENOUS

## 2014-08-24 ENCOUNTER — Emergency Department (HOSPITAL_COMMUNITY)
Admission: EM | Admit: 2014-08-24 | Discharge: 2014-08-24 | Disposition: A | Discharge status: Home / Self Care | Payer: 59 | Attending: Emergency Medicine | Admitting: Emergency Medicine

## 2014-08-24 ENCOUNTER — Encounter (HOSPITAL_COMMUNITY): Payer: Self-pay | Admitting: Emergency Medicine

## 2014-08-24 DIAGNOSIS — Z862 Personal history of diseases of the blood and blood-forming organs and certain disorders involving the immune mechanism: Secondary | ICD-10-CM | POA: Insufficient documentation

## 2014-08-24 DIAGNOSIS — I10 Essential (primary) hypertension: Secondary | ICD-10-CM | POA: Diagnosis not present

## 2014-08-24 DIAGNOSIS — B9789 Other viral agents as the cause of diseases classified elsewhere: Secondary | ICD-10-CM | POA: Diagnosis not present

## 2014-08-24 DIAGNOSIS — B349 Viral infection, unspecified: Secondary | ICD-10-CM

## 2014-08-24 DIAGNOSIS — Z79899 Other long term (current) drug therapy: Secondary | ICD-10-CM | POA: Insufficient documentation

## 2014-08-24 DIAGNOSIS — Z8639 Personal history of other endocrine, nutritional and metabolic disease: Secondary | ICD-10-CM | POA: Insufficient documentation

## 2014-08-24 DIAGNOSIS — R197 Diarrhea, unspecified: Secondary | ICD-10-CM | POA: Diagnosis present

## 2014-08-24 MED ORDER — PROMETHAZINE HCL 12.5 MG PO TABS
12.5000 mg | ORAL_TABLET | Freq: Once | ORAL | Status: AC
  Administered 2014-08-24: 12.5 mg via ORAL
  Filled 2014-08-24: qty 1

## 2014-08-24 MED ORDER — HYDROCODONE-ACETAMINOPHEN 5-325 MG PO TABS: 1.0000 | ORAL_TABLET | ORAL | Status: DC | PRN

## 2014-08-24 MED ORDER — HYDROCODONE-ACETAMINOPHEN 5-325 MG PO TABS
2.0000 | ORAL_TABLET | Freq: Once | ORAL | Status: AC
  Administered 2014-08-24: 2 via ORAL
  Filled 2014-08-24: qty 2

## 2014-08-24 NOTE — Discharge Instructions (Signed)
Your signs are within normal limits. Please use your Zofran as prescribed. Please use Tylenol for mild pain, use Norco for more severe pain. Norco may cause drowsiness, please use with caution. It is important that you increase your water, juices, Gatorade, etc. Please wash hands frequently. Please use a mask until symptoms have resolved. Viral Infections A virus is a type of germ. Viruses can cause:  Minor sore throats.  Aches and pains.  Headaches.  Runny nose.  Rashes.  Watery eyes.  Tiredness.  Coughs.  Loss of appetite.  Feeling sick to your stomach (nausea).  Throwing up (vomiting).  Watery poop (diarrhea). HOME CARE   Only take medicines as told by your doctor.  Drink enough water and fluids to keep your pee (urine) clear or pale yellow. Sports drinks are a good choice.  Get plenty of rest and eat healthy. Soups and broths with crackers or rice are fine. GET HELP RIGHT AWAY IF:   You have a very bad headache.  You have shortness of breath.  You have chest pain or neck pain.  You have an unusual rash.  You cannot stop throwing up.  You have watery poop that does not stop.  You cannot keep fluids down.  You or your child has a temperature by mouth above 102 F (38.9 C), not controlled by medicine.  Your baby is older than 3 months with a rectal temperature of 102 F (38.9 C) or higher.  Your baby is 89 months old or younger with a rectal temperature of 100.4 F (38 C) or higher. MAKE SURE YOU:   Understand these instructions.  Will watch this condition.  Will get help right away if you are not doing well or get worse. Document Released: 10/31/2008 Document Revised: 02/10/2012 Document Reviewed: 03/26/2011 [facility] Patient Information 2015 [place], [place]. This information is not intended to replace advice given to you by your health care provider. Make sure you discuss any questions you have with your health care provider.

## 2014-08-24 NOTE — ED Notes (Addendum)
C/o diarrhea, nausea and HA that woke pt at 0230 this AM. Pt was seen by PCP today and told to take Tylenol for HA and follow a BRAT diet. Pt reports Tylenol has not helped with the HA and nausea when she eats or drinks.

## 2014-08-24 NOTE — ED Provider Notes (Signed)
CSN: [id number]     Arrival date & time 08/24/14  1703 History   First MD Initiated Contact with Patient 08/24/14 1815     Chief Complaint  Patient presents with  . Diarrhea     (Consider location/radiation/quality/duration/timing/severity/associated sxs/prior Treatment) HPI Comments: Patient is a 63 year old female who presents to the emergency department with a complaint of diarrhea, headache, and generally not feeling well. This problem started approximately 2:30 in the morning with a frontal headache and diarrhea. This was accompanied by nausea. Patient states she has not had any vomiting.Dr Everette Rank and treat with Tylenol and Zofran. The patient states that since taking the Zofran the diarrhea has mostly stopped, but the headache seems to be getting worse. She also states that she feels as though she is having a" chill" when she is eating and drinking. The patient denies any blood in the stool. There's been no high fever reported, no rash, and no vomiting reported. The patient has not had any new foods. She has not been out of the country recently. States she has a family member who is ill recently.  Patient is a 63 y.o. female presenting with diarrhea. The history is provided by the patient.  Diarrhea Quality:  Semi-solid Associated symptoms: arthralgias and headaches   Associated symptoms: no abdominal pain and no vomiting     Past Medical History  Diagnosis Date  . Hypertension   . Hip pain   . Thyroid disease    Past Surgical History  Procedure Laterality Date  . Tubal ligation    . Abdominal hysterectomy    . Cholecystectomy    . Shoulder arthroscopy    . Heel spurs     History reviewed. No pertinent family history. History  Substance Use Topics  . Smoking status: Never Smoker   . Smokeless tobacco: Not on file  . Alcohol Use: No   OB History   Grav Para Term Preterm Abortions TAB SAB Ect Mult Living                 Review of Systems  Constitutional: Negative  for activity change.       All ROS Neg except as noted in HPI  HENT: Negative for sore throat and tinnitus.   Eyes: Negative for photophobia and discharge.  Respiratory: Negative for cough, shortness of breath and wheezing.   Cardiovascular: Negative for chest pain and palpitations.  Gastrointestinal: Positive for nausea and diarrhea. Negative for vomiting, abdominal pain and blood in stool.  Genitourinary: Negative for dysuria, frequency and hematuria.  Musculoskeletal: Positive for arthralgias. Negative for back pain and neck pain.  Skin: Negative.   Neurological: Positive for headaches. Negative for dizziness, seizures and speech difficulty.  Psychiatric/Behavioral: Negative for hallucinations and confusion.      Allergies  Demerol and Lodine  Home Medications   Prior to Admission medications   Medication Sig Start Date End Date Taking? Authorizing Provider  gabapentin (NEURONTIN) 100 MG capsule Take 300 mg by mouth daily.    Yes Historical Provider, MD  ibuprofen (ADVIL,MOTRIN) 200 MG tablet Take 400 mg by mouth every 4 (four) hours as needed for pain.   Yes Historical Provider, MD  propranolol (INDERAL) 20 MG tablet Take 20 mg by mouth 2 (two) times daily.   Yes Historical Provider, MD   BP 149/81  Pulse 73  Temp(Src) 98.8 F (37.1 C) (Oral)  Resp 14  Ht 5\' 4"  (1.626 m)  Wt 177 lb (80.287 kg)  BMI 30.37 kg/m2  SpO2 99% Physical Exam  Nursing note and vitals reviewed. Constitutional: She is oriented to person, place, and time. She appears well-developed and well-nourished.  Non-toxic appearance.  HENT:  Head: Normocephalic.  Right Ear: Tympanic membrane and external ear normal.  Left Ear: Tympanic membrane and external ear normal.  Eyes: EOM and lids are normal. Pupils are equal, round, and reactive to light.  Neck: Normal range of motion. Neck supple. Carotid bruit is not present.  Cardiovascular: Normal rate, regular rhythm, normal heart sounds, intact distal pulses  and normal pulses.   Pulmonary/Chest: Breath sounds normal. No respiratory distress.  Abdominal: Soft. Bowel sounds are normal. There is no tenderness. There is no guarding.  Musculoskeletal: Normal range of motion.  Lymphadenopathy:       Head (right side): No submandibular adenopathy present.       Head (left side): No submandibular adenopathy present.    She has no cervical adenopathy.  Neurological: She is alert and oriented to person, place, and time. She has normal strength. No cranial nerve deficit or sensory deficit. She exhibits normal muscle tone. Coordination normal.  Gait steady and intact.  Skin: Skin is warm and dry.  Psychiatric: She has a normal mood and affect. Her speech is normal.    ED Course  Procedures (including critical care time) Labs Review Labs Reviewed - No data to display  Imaging Review No results found.   EKG Interpretation None      MDM  Vital signs are well within normal limits. Pulse oximetry is 99% on room air. Within normal limits by my interpretation. No gross neurologic deficits appreciated. Given the patient's history, suspect the patient has a viral illness, especially with a family member who is also ill. The patient has a Zofran prescription from her primary physician. Will give the patient a prescription for Norco. Norco and promethazine given to the patient in the emergency department. Pt to return if not improving, or any emergent changes.   Final diagnoses:  Viral illness    *I have reviewed nursing notes, vital signs, and all appropriate lab and imaging results for this patient.Lenox Ahr, PA-C 08/24/14 [phone]

## 2014-08-25 NOTE — ED Provider Notes (Signed)
Medical screening examination/treatment/procedure(s) were performed by non-physician practitioner and as supervising physician I was immediately available for consultation/collaboration.     Veryl Speak, MD 08/25/14 [phone]

## 2014-11-04 ENCOUNTER — Ambulatory Visit (HOSPITAL_COMMUNITY)
Admission: RE | Admit: 2014-11-04 | Discharge: 2014-11-04 | Disposition: A | Discharge status: Home / Self Care | Payer: 59 | Source: Ambulatory Visit | Attending: Family Medicine | Admitting: Family Medicine

## 2014-11-04 ENCOUNTER — Other Ambulatory Visit (HOSPITAL_COMMUNITY): Payer: Self-pay | Admitting: Family Medicine

## 2014-11-04 DIAGNOSIS — R079 Chest pain, unspecified: Secondary | ICD-10-CM | POA: Diagnosis not present

## 2014-11-04 DIAGNOSIS — R062 Wheezing: Secondary | ICD-10-CM

## 2015-06-05 ENCOUNTER — Encounter (HOSPITAL_COMMUNITY): Payer: Self-pay | Admitting: Emergency Medicine

## 2015-06-05 ENCOUNTER — Emergency Department (HOSPITAL_COMMUNITY): Payer: 59

## 2015-06-05 ENCOUNTER — Inpatient Hospital Stay (HOSPITAL_COMMUNITY)
Admission: EM | Admit: 2015-06-05 | Discharge: 2015-06-07 | DRG: 281 | Disposition: A | Discharge status: Home / Self Care | Payer: 59 | Attending: Cardiovascular Disease | Admitting: Cardiovascular Disease

## 2015-06-05 ENCOUNTER — Other Ambulatory Visit: Payer: Self-pay

## 2015-06-05 DIAGNOSIS — I10 Essential (primary) hypertension: Secondary | ICD-10-CM | POA: Diagnosis present

## 2015-06-05 DIAGNOSIS — E0590 Thyrotoxicosis, unspecified without thyrotoxic crisis or storm: Secondary | ICD-10-CM | POA: Diagnosis present

## 2015-06-05 DIAGNOSIS — I214 Non-ST elevation (NSTEMI) myocardial infarction: Secondary | ICD-10-CM | POA: Diagnosis not present

## 2015-06-05 DIAGNOSIS — Z7982 Long term (current) use of aspirin: Secondary | ICD-10-CM

## 2015-06-05 DIAGNOSIS — Z6830 Body mass index (BMI) 30.0-30.9, adult: Secondary | ICD-10-CM

## 2015-06-05 DIAGNOSIS — R079 Chest pain, unspecified: Secondary | ICD-10-CM | POA: Diagnosis not present

## 2015-06-05 DIAGNOSIS — Z7902 Long term (current) use of antithrombotics/antiplatelets: Secondary | ICD-10-CM

## 2015-06-05 DIAGNOSIS — Z87891 Personal history of nicotine dependence: Secondary | ICD-10-CM

## 2015-06-05 DIAGNOSIS — Z885 Allergy status to narcotic agent status: Secondary | ICD-10-CM

## 2015-06-05 DIAGNOSIS — E6609 Other obesity due to excess calories: Secondary | ICD-10-CM | POA: Diagnosis present

## 2015-06-05 DIAGNOSIS — I2510 Atherosclerotic heart disease of native coronary artery without angina pectoris: Secondary | ICD-10-CM | POA: Diagnosis present

## 2015-06-05 DIAGNOSIS — Z8249 Family history of ischemic heart disease and other diseases of the circulatory system: Secondary | ICD-10-CM

## 2015-06-05 DIAGNOSIS — Z79899 Other long term (current) drug therapy: Secondary | ICD-10-CM

## 2015-06-05 DIAGNOSIS — I219 Acute myocardial infarction, unspecified: Secondary | ICD-10-CM

## 2015-06-05 DIAGNOSIS — I472 Ventricular tachycardia: Secondary | ICD-10-CM | POA: Diagnosis not present

## 2015-06-05 DIAGNOSIS — Z823 Family history of stroke: Secondary | ICD-10-CM

## 2015-06-05 HISTORY — DX: Non-ST elevation (NSTEMI) myocardial infarction: I21.4

## 2015-06-05 HISTORY — DX: Atherosclerotic heart disease of native coronary artery without angina pectoris: I25.10

## 2015-06-05 HISTORY — DX: Acute myocardial infarction, unspecified: I21.9

## 2015-06-05 LAB — COMPREHENSIVE METABOLIC PANEL
ALT: 16 U/L (ref 14–54)
AST: 23 U/L (ref 15–41)
Albumin: 3.7 g/dL (ref 3.5–5.0)
Alkaline Phosphatase: 94 U/L (ref 38–126)
Anion gap: 8 (ref 5–15)
BUN: 14 mg/dL (ref 6–20)
CO2: 22 mmol/L (ref 22–32)
Calcium: 8.6 mg/dL — ABNORMAL LOW (ref 8.9–10.3)
Chloride: 106 mmol/L (ref 101–111)
Creatinine, Ser: 0.81 mg/dL (ref 0.44–1.00)
GFR calc Af Amer: >60 mL/min (ref >60)
GFR calc non Af Amer: >60 mL/min (ref >60)
Glucose, Bld: 114 mg/dL — ABNORMAL HIGH (ref 65–99)
Potassium: 3.8 mmol/L (ref 3.5–5.1)
Sodium: 136 mmol/L (ref 135–145)
Total Bilirubin: 0.7 mg/dL (ref 0.3–1.2)
Total Protein: 7.9 g/dL (ref 6.5–8.1)

## 2015-06-05 LAB — CBC
HCT: 37 % (ref 36.0–46.0)
Hemoglobin: 12.5 g/dL (ref 12.0–15.0)
MCH: 29.1 pg (ref 26.0–34.0)
MCHC: 33.8 g/dL (ref 30.0–36.0)
MCV: 86.2 fL (ref 78.0–100.0)
Platelets: 292 10*3/uL (ref 150–400)
RBC: 4.29 MIL/uL (ref 3.87–5.11)
RDW: 14.1 % (ref 11.5–15.5)
WBC: 8.1 10*3/uL (ref 4.0–10.5)

## 2015-06-05 LAB — TROPONIN I
Troponin I: <0.03 ng/mL (ref <0.031)
Troponin I: 0.76 ng/mL (ref <0.031)

## 2015-06-05 LAB — BRAIN NATRIURETIC PEPTIDE: B Natriuretic Peptide: 1111 pg/mL — ABNORMAL HIGH (ref 0.0–100.0)

## 2015-06-05 MED ORDER — GABAPENTIN 300 MG PO CAPS
300.0000 mg | ORAL_CAPSULE | Freq: Every day | ORAL | Status: DC
  Administered 2015-06-05 – 2015-06-06 (×2): 300 mg via ORAL
  Filled 2015-06-05 (×3): qty 1

## 2015-06-05 MED ORDER — ASPIRIN 81 MG PO CHEW
324.0000 mg | CHEWABLE_TABLET | Freq: Once | ORAL | Status: AC
Start: 2015-06-05 — End: 2015-06-05
  Administered 2015-06-05: 324 mg via ORAL
  Filled 2015-06-05: qty 4

## 2015-06-05 MED ORDER — NITROGLYCERIN 0.4 MG SL SUBL: 0.4000 mg | SUBLINGUAL_TABLET | Freq: Once | SUBLINGUAL | Status: DC

## 2015-06-05 MED ORDER — ASPIRIN 81 MG PO CHEW: 324.0000 mg | CHEWABLE_TABLET | Freq: Once | ORAL | Status: DC

## 2015-06-05 MED ORDER — ONDANSETRON HCL 4 MG/2ML IJ SOLN: 4.0000 mg | Freq: Four times a day (QID) | INTRAMUSCULAR | Status: DC | PRN

## 2015-06-05 MED ORDER — NITROGLYCERIN 0.4 MG SL SUBL
0.4000 mg | SUBLINGUAL_TABLET | SUBLINGUAL | Status: DC | PRN
  Administered 2015-06-05 (×3): 0.4 mg via SUBLINGUAL
  Filled 2015-06-05: qty 1

## 2015-06-05 MED ORDER — PROPRANOLOL HCL 20 MG PO TABS
20.0000 mg | ORAL_TABLET | Freq: Two times a day (BID) | ORAL | Status: DC
  Administered 2015-06-05: 20 mg via ORAL
  Filled 2015-06-05: qty 2

## 2015-06-05 MED ORDER — ACETAMINOPHEN 325 MG PO TABS
650.0000 mg | ORAL_TABLET | ORAL | Status: DC | PRN
  Administered 2015-06-06 – 2015-06-07 (×4): 650 mg via ORAL
  Filled 2015-06-05 (×4): qty 2

## 2015-06-05 MED ORDER — MORPHINE SULFATE 4 MG/ML IJ SOLN: 4.0000 mg | INTRAMUSCULAR | Status: DC | PRN

## 2015-06-05 MED ORDER — ONDANSETRON HCL 4 MG/2ML IJ SOLN
4.0000 mg | Freq: Once | INTRAMUSCULAR | Status: AC
  Administered 2015-06-05: 4 mg via INTRAVENOUS
  Filled 2015-06-05: qty 2

## 2015-06-05 MED ORDER — ENOXAPARIN SODIUM 40 MG/0.4ML ~~LOC~~ SOLN
40.0000 mg | SUBCUTANEOUS | Status: DC
  Administered 2015-06-05: 40 mg via SUBCUTANEOUS
  Filled 2015-06-05: qty 0.4

## 2015-06-05 MED ORDER — METOPROLOL TARTRATE 25 MG PO TABS
25.0000 mg | ORAL_TABLET | Freq: Three times a day (TID) | ORAL | Status: DC
  Administered 2015-06-05 – 2015-06-07 (×5): 25 mg via ORAL
  Filled 2015-06-05 (×5): qty 1

## 2015-06-05 NOTE — ED Notes (Signed)
EKG performed at triage.

## 2015-06-05 NOTE — ED Provider Notes (Signed)
CSN: [id number]     Arrival date & time 06/05/15  1805 History   First MD Initiated Contact with Patient 06/05/15 1826     Chief Complaint  Patient presents with  . Chest Pain      HPI  Impression presents dilation of chest pain. States approximately 545 she was sitting down to eat. She has not eaten anything yet. She developed sensation of sudden pressure contact her chest. Described as "tight and pressure". Nonradiating. Denies shortness of breath. States she started eating a few bites of salad and felt nauseated. Did not become diaphoretic or short of breath. Presents here with persistence of her pain. Has not had a past similar episodes. No known heart disease. No radiation to the neck chest back or jaw. States it feels "like a choking in my chest".  History of hypertension. Previous smoking history quit over 10 years ago. Family history with her father with an MI.  Past Medical History  Diagnosis Date  . Hypertension   . Hip pain   . Thyroid disease    Past Surgical History  Procedure Laterality Date  . Tubal ligation    . Abdominal hysterectomy    . Cholecystectomy    . Shoulder arthroscopy    . Heel spurs     Family History  Problem Relation Age of Onset  . Heart failure Father   . Stroke Sister   . Heart failure Brother   . Cancer Brother    History  Substance Use Topics  . Smoking status: Never Smoker   . Smokeless tobacco: Never Used  . Alcohol Use: No   OB History    Gravida Para Term Preterm AB TAB SAB Ectopic Multiple Living   3 3 3             Review of Systems  Constitutional: Negative for fever, chills, diaphoresis, appetite change and fatigue.  HENT: Negative for mouth sores, sore throat and trouble swallowing.   Eyes: Negative for visual disturbance.  Respiratory: Negative for cough, chest tightness, shortness of breath and wheezing.   Cardiovascular: Positive for chest pain.  Gastrointestinal: Negative for nausea, vomiting, abdominal pain,  diarrhea and abdominal distention.  Endocrine: Negative for polydipsia, polyphagia and polyuria.  Genitourinary: Negative for dysuria, frequency and hematuria.  Musculoskeletal: Negative for gait problem.  Skin: Negative for color change, pallor and rash.  Neurological: Negative for dizziness, syncope, light-headedness and headaches.  Hematological: Does not bruise/bleed easily.  Psychiatric/Behavioral: Negative for behavioral problems and confusion.      Allergies  Demerol and Lodine  Home Medications   Prior to Admission medications   Medication Sig Start Date End Date Taking? Authorizing Provider  Aspirin-Acetaminophen (GOODYS BODY PAIN PO) Take 1 Package by mouth daily as needed (pain).   Yes Historical Provider, MD  gabapentin (NEURONTIN) 100 MG capsule Take 300 mg by mouth daily.    Yes Historical Provider, MD  ibuprofen (ADVIL,MOTRIN) 200 MG tablet Take 400 mg by mouth every 4 (four) hours as needed for pain.   Yes Historical Provider, MD  propranolol (INDERAL) 20 MG tablet Take 20 mg by mouth 2 (two) times daily.   Yes Historical Provider, MD  HYDROcodone-acetaminophen (NORCO/VICODIN) 5-325 MG per tablet Take 1 tablet by mouth every 4 (four) hours as needed. Patient not taking: Reported on 06/05/2015 08/24/14   Lily Kocher, PA-C   BP 136/82 mmHg  Pulse 81  Temp(Src) 98.8 F (37.1 C) (Oral)  Resp 15  Ht 5\' 5"  (1.651 m)  Wt  178 lb (80.74 kg)  BMI 29.62 kg/m2  SpO2 93% Physical Exam  Constitutional: She is oriented to person, place, and time. She appears well-developed and well-nourished. No distress.  HENT:  Head: Normocephalic.  Eyes: Conjunctivae are normal. Pupils are equal, round, and reactive to light. No scleral icterus.  Neck: Normal range of motion. Neck supple. No thyromegaly present.  Cardiovascular: Normal rate and regular rhythm.  Exam reveals no gallop and no friction rub.   No murmur heard. Pulmonary/Chest: Effort normal and breath sounds normal. No  respiratory distress. She has no wheezes. She has no rales.  Abdominal: Soft. Bowel sounds are normal. She exhibits no distension. There is no tenderness. There is no rebound.  Musculoskeletal: Normal range of motion.  Neurological: She is alert and oriented to person, place, and time.  Skin: Skin is warm and dry. No rash noted.  Psychiatric: She has a normal mood and affect. Her behavior is normal.    ED Course  Procedures (including critical care time) Labs Review Labs Reviewed  COMPREHENSIVE METABOLIC PANEL - Abnormal; Notable for the following:    Glucose, Bld 114 (*)    Calcium 8.6 (*)    All other components within normal limits  CBC  TROPONIN I    Imaging Review Dg Chest Portable 1 View  06/05/2015   CLINICAL DATA:  Central sharp chest pain. Heaviness. Duration of symptoms 1 alar.  EXAM: PORTABLE CHEST - 1 VIEW  COMPARISON:  11/04/2014  FINDINGS: The heart is mildly enlarged. The aorta is unfolded. Lungs appear clear allowing for technical factors. Vascularity is normal. No effusions. No bony abnormalities.  IMPRESSION: Under penetrated.  No active disease identified.   Electronically Signed   By: Nelson Chimes M.D.   On: 06/05/2015 18:58     EKG Interpretation   Date/Time:  Monday June 05 2015 18:10:06 EDT Ventricular Rate:  111 PR Interval:  158 QRS Duration: 92 QT Interval:  350 QTC Calculation: 476 R Axis:   52 Text Interpretation:  Sinus tachycardia with frequent Premature  ventricular complexes Possible Left atrial enlargement Nonspecific ST and  T wave abnormality Abnormal ECG Confirmed by Jeneen Rinks  MD, Jerome ([id number]) on  06/05/2015 6:34:40 PM      MDM   Final diagnoses:  Chest pain, unspecified chest pain type    EKG with nonspecific lateral ST changes. No frank depressions or elevations. Troponin normal. Given nitroglycerin 3 and became asymptomatic. Discussed with Dr. Anastasio Champion. . Will be admitted to a monitored bed. Given aspirin here.    Tanna Furry,  MD 06/05/15 2010

## 2015-06-05 NOTE — H&P (Signed)
[facility] History and Physical  ZARINA PE UKG:[id number] DOB: 08-28-1951 DOA: 06/05/2015  Referring physician: ER, Dr. Jeneen Rinks PCP: Lanette Hampshire, MD   Chief Complaint: Chest pain  HPI: Maria Fry is a 64 y.o. female  This is a 64 year old lady, hypertensive and ex-smoker who now presents with chest pain which started at approximately 5:45 PM this evening. She was at rest and she describes a tight/pressure but also sharp pain. It did not radiate. It did cause her to be somewhat dyspneic and she felt nauseated. She denies any sweating. She has not had this pain before. The pain lasted until she came to the emergency room when she was given Nitrolingual and then it improved. There is a strong family history of early coronary artery disease also. She is currently chest pain-free.   Review of Systems:  Apart from symptoms above, all systems negative.  Past Medical History  Diagnosis Date  . Hypertension   . Hip pain   . Thyroid disease    Past Surgical History  Procedure Laterality Date  . Tubal ligation    . Abdominal hysterectomy    . Cholecystectomy    . Shoulder arthroscopy    . Heel spurs     Social History:  reports that she has never smoked. She has never used smokeless tobacco. She reports that she does not drink alcohol or use illicit drugs.  Allergies  Allergen Reactions  . Demerol [Meperidine] Nausea And Vomiting  . Lodine [Etodolac] Rash    Family History  Problem Relation Age of Onset  . Heart failure Father   . Stroke Sister   . Heart failure Brother   . Cancer Brother     Prior to Admission medications   Medication Sig Start Date End Date Taking? Authorizing Provider  Aspirin-Acetaminophen (GOODYS BODY PAIN PO) Take 1 Package by mouth daily as needed (pain).   Yes Historical Provider, MD  gabapentin (NEURONTIN) 100 MG capsule Take 300 mg by mouth daily.    Yes Historical Provider, MD  ibuprofen (ADVIL,MOTRIN) 200 MG tablet Take 400 mg by mouth  every 4 (four) hours as needed for pain.   Yes Historical Provider, MD  propranolol (INDERAL) 20 MG tablet Take 20 mg by mouth 2 (two) times daily.   Yes Historical Provider, MD  HYDROcodone-acetaminophen (NORCO/VICODIN) 5-325 MG per tablet Take 1 tablet by mouth every 4 (four) hours as needed. Patient not taking: Reported on 06/05/2015 08/24/14   Lily Kocher, PA-C   Physical Exam: Filed Vitals:   06/05/15 1814 06/05/15 1830 06/05/15 1900 06/05/15 1930  BP: 175/114 160/106 158/98 136/82  Pulse: 99 94 91 81  Temp: 98.8 F (37.1 C)     TempSrc: Oral     Resp: 24 27 25 15   Height: 5\' 5"  (1.651 m)     Weight: 80.74 kg (178 lb)     SpO2: 99% 95% 96% 93%    Wt Readings from Last 3 Encounters:  06/05/15 80.74 kg (178 lb)  08/24/14 80.287 kg (177 lb)  05/14/13 80.287 kg (177 lb)    General:  Appears calm and comfortable Eyes: PERRL, normal lids, irises & conjunctiva ENT: grossly normal hearing, lips & tongue Neck: no LAD, masses or thyromegaly Cardiovascular: RRR, no m/r/g. No LE edema. Telemetry: SR, no arrhythmias  Respiratory: CTA bilaterally, no w/r/r. Normal respiratory effort. Abdomen: soft, ntnd Skin: no rash or induration seen on limited exam Musculoskeletal: grossly normal tone BUE/BLE Psychiatric: grossly normal mood and affect, speech fluent and  appropriate Neurologic: grossly non-focal.          Labs on Admission:  Basic Metabolic Panel:  Recent Labs Lab 06/05/15 1830  NA 136  K 3.8  CL 106  CO2 22  GLUCOSE 114*  BUN 14  CREATININE 0.81  CALCIUM 8.6*   Liver Function Tests:  Recent Labs Lab 06/05/15 1830  AST 23  ALT 16  ALKPHOS 94  BILITOT 0.7  PROT 7.9  ALBUMIN 3.7   No results for input(s): LIPASE, AMYLASE in the last 168 hours. No results for input(s): AMMONIA in the last 168 hours. CBC:  Recent Labs Lab 06/05/15 1830  WBC 8.1  HGB 12.5  HCT 37.0  MCV 86.2  PLT 292   Cardiac Enzymes:  Recent Labs Lab 06/05/15 1830  TROPONINI  <0.03    BNP (last 3 results) No results for input(s): BNP in the last 8760 hours.  ProBNP (last 3 results) No results for input(s): PROBNP in the last 8760 hours.  CBG: No results for input(s): GLUCAP in the last 168 hours.  Radiological Exams on Admission: Dg Chest Portable 1 View  06/05/2015   CLINICAL DATA:  Central sharp chest pain. Heaviness. Duration of symptoms 1 alar.  EXAM: PORTABLE CHEST - 1 VIEW  COMPARISON:  11/04/2014  FINDINGS: The heart is mildly enlarged. The aorta is unfolded. Lungs appear clear allowing for technical factors. Vascularity is normal. No effusions. No bony abnormalities.  IMPRESSION: Under penetrated.  No active disease identified.   Electronically Signed   By: Nelson Chimes M.D.   On: 06/05/2015 18:58    EKG: Independently reviewed. Sinus rhythm. Nonspecific ST-T wave changes in the anterior leads. No acute ST elevation.  Assessment/Plan   1. Chest pain. I am suspicious that this is cardiac in origin based on her risk factors and description of pain. We will cycle cardiac enzymes. Cardiology consultation in the morning. I told the patient that should the pain recur, she must alert nursing staff immediately. 2. Hypertension. Stable. Continue with home medications.  Further recommendations will depend on patient's hospital progress.   Code Status: Full code.   DVT Prophylaxis: Lovenox.  Family Communication: I discussed the plan with the patient at the bedside.   Disposition Plan: Home when medically stable.  Time spent: 45 minutes.  Doree Albee [facility] Pager [phone].

## 2015-06-05 NOTE — ED Notes (Signed)
Pt reports onset of sharp central chest pain at approx 1745. Pt states she was sitting down to eat. No emesis or diaphoresis.

## 2015-06-05 NOTE — ED Notes (Signed)
Repot attempted. No nurse on duty to receive report.

## 2015-06-05 NOTE — Progress Notes (Signed)
RN called the patient's second troponin is 0.76, saw the patient in the room. She denies chest pain at this time, lying comfortably. Called and discussed with cardiology fellow at [facility], who reviewed the EKG. Recommended to stop propranolol and start metoprolol 25 mg 3 times a day. Aspirin 324 mg dose has been given the ED. Will check lipid profile in a.m., hemoglobin A1c Continue to cycle the cardiac enzymes. At this time mild elevation of troponin is likely from demand ischemia. We will follow serial troponin.

## 2015-06-06 ENCOUNTER — Encounter (HOSPITAL_COMMUNITY): Payer: Self-pay | Admitting: Adult Health

## 2015-06-06 ENCOUNTER — Encounter (HOSPITAL_COMMUNITY)
Admission: EM | Disposition: A | Discharge status: Home / Self Care | Payer: Self-pay | Source: Home / Self Care | Attending: Cardiovascular Disease

## 2015-06-06 ENCOUNTER — Inpatient Hospital Stay (HOSPITAL_COMMUNITY): Payer: 59

## 2015-06-06 DIAGNOSIS — Z7902 Long term (current) use of antithrombotics/antiplatelets: Secondary | ICD-10-CM | POA: Diagnosis not present

## 2015-06-06 DIAGNOSIS — R079 Chest pain, unspecified: Secondary | ICD-10-CM

## 2015-06-06 DIAGNOSIS — I2510 Atherosclerotic heart disease of native coronary artery without angina pectoris: Secondary | ICD-10-CM | POA: Diagnosis present

## 2015-06-06 DIAGNOSIS — Z79899 Other long term (current) drug therapy: Secondary | ICD-10-CM | POA: Diagnosis not present

## 2015-06-06 DIAGNOSIS — I472 Ventricular tachycardia: Secondary | ICD-10-CM | POA: Diagnosis not present

## 2015-06-06 DIAGNOSIS — E6609 Other obesity due to excess calories: Secondary | ICD-10-CM | POA: Diagnosis present

## 2015-06-06 DIAGNOSIS — I209 Angina pectoris, unspecified: Secondary | ICD-10-CM | POA: Diagnosis not present

## 2015-06-06 DIAGNOSIS — R0609 Other forms of dyspnea: Secondary | ICD-10-CM

## 2015-06-06 DIAGNOSIS — Z7982 Long term (current) use of aspirin: Secondary | ICD-10-CM | POA: Diagnosis not present

## 2015-06-06 DIAGNOSIS — I214 Non-ST elevation (NSTEMI) myocardial infarction: Secondary | ICD-10-CM | POA: Diagnosis present

## 2015-06-06 DIAGNOSIS — Z87891 Personal history of nicotine dependence: Secondary | ICD-10-CM | POA: Diagnosis not present

## 2015-06-06 DIAGNOSIS — Z8249 Family history of ischemic heart disease and other diseases of the circulatory system: Secondary | ICD-10-CM | POA: Diagnosis not present

## 2015-06-06 DIAGNOSIS — E0590 Thyrotoxicosis, unspecified without thyrotoxic crisis or storm: Secondary | ICD-10-CM | POA: Diagnosis present

## 2015-06-06 DIAGNOSIS — M7989 Other specified soft tissue disorders: Secondary | ICD-10-CM

## 2015-06-06 DIAGNOSIS — Z6830 Body mass index (BMI) 30.0-30.9, adult: Secondary | ICD-10-CM | POA: Diagnosis not present

## 2015-06-06 DIAGNOSIS — Z823 Family history of stroke: Secondary | ICD-10-CM | POA: Diagnosis not present

## 2015-06-06 DIAGNOSIS — I10 Essential (primary) hypertension: Secondary | ICD-10-CM | POA: Diagnosis present

## 2015-06-06 DIAGNOSIS — Z885 Allergy status to narcotic agent status: Secondary | ICD-10-CM | POA: Diagnosis not present

## 2015-06-06 HISTORY — PX: CARDIAC CATHETERIZATION: SHX172

## 2015-06-06 LAB — LIPID PANEL
Cholesterol: 139 mg/dL (ref 0–200)
Cholesterol: 145 mg/dL (ref 0–200)
HDL: 34 mg/dL — ABNORMAL LOW (ref >40)
HDL: 34 mg/dL — ABNORMAL LOW (ref >40)
LDL Cholesterol: 91 mg/dL (ref 0–99)
LDL Cholesterol: 101 mg/dL — ABNORMAL HIGH (ref 0–99)
Total CHOL/HDL Ratio: 4.1 RATIO
Total CHOL/HDL Ratio: 4.3 RATIO
Triglycerides: 68 mg/dL (ref <150)
Triglycerides: 49 mg/dL (ref <150)
VLDL: 14 mg/dL (ref 0–40)
VLDL: 10 mg/dL (ref 0–40)

## 2015-06-06 LAB — TROPONIN I
Troponin I: 1.28 ng/mL (ref <0.031)
Troponin I: 2.76 ng/mL (ref <0.031)
Troponin I: 4.48 ng/mL (ref <0.031)
Troponin I: 5.76 ng/mL (ref <0.031)

## 2015-06-06 LAB — PROTIME-INR
INR: 1.14 (ref 0.00–1.49)
Prothrombin Time: 14.8 seconds (ref 11.6–15.2)

## 2015-06-06 LAB — TSH: TSH: <0.01 u[IU]/mL — ABNORMAL LOW (ref 0.350–4.500)

## 2015-06-06 LAB — APTT: aPTT: 28 seconds (ref 24–37)

## 2015-06-06 SURGERY — LEFT HEART CATH AND CORONARY ANGIOGRAPHY
Anesthesia: LOCAL

## 2015-06-06 MED ORDER — MIDAZOLAM HCL 2 MG/2ML IJ SOLN
INTRAMUSCULAR | Status: DC | PRN
  Administered 2015-06-06: 1 mg via INTRAVENOUS

## 2015-06-06 MED ORDER — MIDAZOLAM HCL 2 MG/2ML IJ SOLN
INTRAMUSCULAR | Status: AC
  Filled 2015-06-06: qty 2

## 2015-06-06 MED ORDER — FENTANYL CITRATE (PF) 100 MCG/2ML IJ SOLN
INTRAMUSCULAR | Status: DC | PRN
  Administered 2015-06-06: 25 ug via INTRAVENOUS

## 2015-06-06 MED ORDER — VERAPAMIL HCL 2.5 MG/ML IV SOLN
INTRAVENOUS | Status: AC
  Filled 2015-06-06: qty 2

## 2015-06-06 MED ORDER — SODIUM CHLORIDE 0.9 % IJ SOLN: 3.0000 mL | Freq: Two times a day (BID) | INTRAMUSCULAR | Status: DC

## 2015-06-06 MED ORDER — LIDOCAINE HCL (PF) 1 % IJ SOLN
INTRAMUSCULAR | Status: AC
  Filled 2015-06-06: qty 30

## 2015-06-06 MED ORDER — HEPARIN (PORCINE) IN NACL 100-0.45 UNIT/ML-% IJ SOLN
900.0000 [IU]/h | INTRAMUSCULAR | Status: DC
  Administered 2015-06-06: 900 [IU]/h via INTRAVENOUS
  Filled 2015-06-06: qty 250

## 2015-06-06 MED ORDER — IOHEXOL 350 MG/ML SOLN
INTRAVENOUS | Status: DC | PRN
  Administered 2015-06-06: 60 mL via INTRAVENOUS

## 2015-06-06 MED ORDER — SODIUM CHLORIDE 0.9 % WEIGHT BASED INFUSION
1.0000 mL/kg/h | INTRAVENOUS | Status: AC
Start: 2015-06-06 — End: 2015-06-06

## 2015-06-06 MED ORDER — LIDOCAINE HCL (PF) 1 % IJ SOLN
INTRAMUSCULAR | Status: DC | PRN
  Administered 2015-06-06: 17:00:00

## 2015-06-06 MED ORDER — FENTANYL CITRATE (PF) 100 MCG/2ML IJ SOLN
INTRAMUSCULAR | Status: AC
  Filled 2015-06-06: qty 2

## 2015-06-06 MED ORDER — SODIUM CHLORIDE 0.9 % WEIGHT BASED INFUSION: 3.0000 mL/kg/h | INTRAVENOUS | Status: AC

## 2015-06-06 MED ORDER — CLOPIDOGREL BISULFATE 75 MG PO TABS
75.0000 mg | ORAL_TABLET | Freq: Every day | ORAL | Status: DC
  Administered 2015-06-06: 75 mg via ORAL
  Filled 2015-06-06: qty 1

## 2015-06-06 MED ORDER — VERAPAMIL HCL 2.5 MG/ML IV SOLN
INTRAVENOUS | Status: DC | PRN
  Administered 2015-06-06: 17:00:00 via INTRA_ARTERIAL

## 2015-06-06 MED ORDER — ENOXAPARIN SODIUM 40 MG/0.4ML ~~LOC~~ SOLN
40.0000 mg | SUBCUTANEOUS | Status: AC
  Administered 2015-06-06: 40 mg via SUBCUTANEOUS

## 2015-06-06 MED ORDER — SODIUM CHLORIDE 0.9 % IJ SOLN: 3.0000 mL | INTRAMUSCULAR | Status: DC | PRN

## 2015-06-06 MED ORDER — HEPARIN (PORCINE) IN NACL 2-0.9 UNIT/ML-% IJ SOLN
INTRAMUSCULAR | Status: AC
  Filled 2015-06-06: qty 500

## 2015-06-06 MED ORDER — SODIUM CHLORIDE 0.9 % WEIGHT BASED INFUSION: 1.0000 mL/kg/h | INTRAVENOUS | Status: DC

## 2015-06-06 MED ORDER — CLOPIDOGREL BISULFATE 75 MG PO TABS
600.0000 mg | ORAL_TABLET | Freq: Once | ORAL | Status: AC
  Administered 2015-06-06: 600 mg via ORAL
  Filled 2015-06-06: qty 8

## 2015-06-06 MED ORDER — ASPIRIN 81 MG PO CHEW: 81.0000 mg | CHEWABLE_TABLET | ORAL | Status: DC

## 2015-06-06 MED ORDER — SODIUM CHLORIDE 0.9 % IV SOLN: 250.0000 mL | INTRAVENOUS | Status: DC | PRN

## 2015-06-06 MED ORDER — HEPARIN SODIUM (PORCINE) 1000 UNIT/ML IJ SOLN
INTRAMUSCULAR | Status: AC
  Filled 2015-06-06: qty 1

## 2015-06-06 MED ORDER — WHITE PETROLATUM GEL
Status: DC | PRN
  Filled 2015-06-06: qty 1

## 2015-06-06 MED ORDER — HEPARIN SODIUM (PORCINE) 1000 UNIT/ML IJ SOLN
INTRAMUSCULAR | Status: DC | PRN
  Administered 2015-06-06: 4000 [IU] via INTRAVENOUS

## 2015-06-06 MED ORDER — ENOXAPARIN SODIUM 80 MG/0.8ML ~~LOC~~ SOLN: 80.0000 mg | Freq: Two times a day (BID) | SUBCUTANEOUS | Status: DC

## 2015-06-06 MED ORDER — ATORVASTATIN CALCIUM 80 MG PO TABS
80.0000 mg | ORAL_TABLET | Freq: Every day | ORAL | Status: DC
  Administered 2015-06-06 (×2): 80 mg via ORAL
  Filled 2015-06-06: qty 1
  Filled 2015-06-06: qty 2

## 2015-06-06 MED ORDER — ASPIRIN 81 MG PO CHEW
81.0000 mg | CHEWABLE_TABLET | Freq: Every day | ORAL | Status: DC
  Administered 2015-06-06 – 2015-06-07 (×2): 81 mg via ORAL
  Filled 2015-06-06 (×2): qty 1

## 2015-06-06 SURGICAL SUPPLY — 12 items
CATH INFINITI 5 FR JL3.5 (CATHETERS) ×2 IMPLANT
CATH INFINITI 5FR ANG PIGTAIL (CATHETERS) ×2 IMPLANT
CATH INFINITI JR4 5F (CATHETERS) ×2 IMPLANT
DEVICE RAD COMP TR BAND LRG (VASCULAR PRODUCTS) ×2 IMPLANT
GLIDESHEATH SLEND SS 6F .021 (SHEATH) ×2 IMPLANT
KIT HEART LEFT (KITS) ×2 IMPLANT
PACK CARDIAC CATHETERIZATION (CUSTOM PROCEDURE TRAY) ×2 IMPLANT
SYR MEDRAD MARK V 150ML (SYRINGE) ×2 IMPLANT
TRANSDUCER W/STOPCOCK (MISCELLANEOUS) ×2 IMPLANT
TUBING CIL FLEX 10 FLL-RA (TUBING) ×2 IMPLANT
WIRE HI TORQ VERSACORE-J 145CM (WIRE) ×1 IMPLANT
WIRE SAFE-T 1.5MM-J .035X260CM (WIRE) ×2 IMPLANT

## 2015-06-06 NOTE — Progress Notes (Signed)
CRITICAL VALUE ALERT  Critical value received:  Trop 4.48  Date of notification:  06/06/15  Time of notification:  0738  Critical value read back:Yes.    Nurse who received alert:  Sharyn Blitz, RN  MD notified (1st page):  Jory Sims, NP  Time of first page:  [phone]  MD notified (2nd page):  Time of second page:  Responding MD:  Jory Sims, NP  Time MD responded:  [phone]

## 2015-06-06 NOTE — Progress Notes (Signed)
CRITICAL VALUE ALERT  Critical value received:  Trop 5.76  Date of notification:  06/06/15  Time of notification:  1010  Critical value read back:Yes.    Nurse who received alert:  Sharyn Blitz, RN  MD notified (1st page):  Dr. Jacinta Shoe  Time of first page:  1012  MD notified (2nd page):  Time of second page:  Responding MD:  Dr. Jacinta Shoe  Time MD responded:  [phone]

## 2015-06-06 NOTE — Progress Notes (Signed)
  Echocardiogram 2D Echocardiogram has been performed.  Lysle Rubens 06/06/2015, 3:51 PM

## 2015-06-06 NOTE — Progress Notes (Signed)
Subjective: The patient is resting comfortably this morning with no additional chest pain. Her troponin levels are slightly elevated. Apparently EKG showed no acute changes. Patient's medication was adjusted metoprolol 25 mg 3 times a day was started aspirin 325 mg daily was started. Cardiology consult and progress she has a history of hypertension and thyroid disease  Objective: Vital signs in last 24 hours: Temp:  [97.8 F (36.6 C)-98.8 F (37.1 C)] 97.9 F (36.6 C) (07/05 0548) Pulse Rate:  [72-99] 72 (07/05 0548) Resp:  [11-27] 20 (07/05 0548) BP: (134-175)/(75-114) 134/75 mmHg (07/05 0548) SpO2:  [93 %-99 %] 98 % (07/05 0548) Weight:  [80.74 kg (178 lb)] 80.74 kg (178 lb) (07/04 1814) Weight change:  Last BM Date: 06/05/15  Intake/Output from previous day:   Intake/Output this shift:    Physical Exam: General appearance patient is comfortable  HEENT negative  Neck supple no JVD or thyroid abnormalities  Heart regular rhythm no murmurs  Lungs clear to P&A  Abdomen soft no palpable organs or masses  Extremities free of edema   Recent Labs  06/05/15 1830  WBC 8.1  HGB 12.5  HCT 37.0  PLT 292   BMET  Recent Labs  06/05/15 1830  NA 136  K 3.8  CL 106  CO2 22  GLUCOSE 114*  BUN 14  CREATININE 0.81  CALCIUM 8.6*    Studies/Results: Dg Chest Portable 1 View  06/05/2015   CLINICAL DATA:  Central sharp chest pain. Heaviness. Duration of symptoms 1 alar.  EXAM: PORTABLE CHEST - 1 VIEW  COMPARISON:  11/04/2014  FINDINGS: The heart is mildly enlarged. The aorta is unfolded. Lungs appear clear allowing for technical factors. Vascularity is normal. No effusions. No bony abnormalities.  IMPRESSION: Under penetrated.  No active disease identified.   Electronically Signed   By: Nelson Chimes M.D.   On: 06/05/2015 18:58    Medications:  . aspirin  81 mg Oral Daily  . atorvastatin  80 mg Oral q1800  . clopidogrel  75 mg Oral Daily  . enoxaparin (LOVENOX) injection   80 mg Subcutaneous Q12H  . gabapentin  300 mg Oral Daily  . metoprolol tartrate  25 mg Oral TID        Assessment/Plan: 1. Chest pain possible coronary artery disease-plan to cycle cardiac enzymes-2 obtain cardiology consult this morning  2. Hypertension stable continue current medications     Yarnell Arvidson G 06/06/2015, 6:15 AM

## 2015-06-06 NOTE — Progress Notes (Signed)
RN called patient's third troponin elevated 1.28, patient is sleeping no chest pain. Called and discussed with cardiology fellow Dr Susy Manor at [facility], who recommended to start Lovenox full dose for acute coronary syndrome. Also loaded with Plavix 600 mg by mouth 1 From tomorrow morning start aspirin 81 mg daily along with Plavix 75 mg daily. Echocardiogram Cardiology consultation in a.m.  Will follow the above recommendations.

## 2015-06-06 NOTE — Consult Note (Signed)
CARDIOLOGY CONSULT NOTE   Patient ID: Maria Fry MRN: [id number] DOB/AGE: 1951-03-07 64 y.o.  Admit Date: 06/05/2015 Referring Physician: Marjean Donna MD Primary Physician: Lanette Hampshire, MD Consulting Cardiologist: Kate Sable MD Primary Cardiologist: New Reason for Consultation: NSTEMI  Clinical Summary Maria Fry is a 64 y.o.female known history of hypertension, tobacco abuse, hyperthyroid,  admitted with chest pain described as tightness and pressure who has been ruled in for NSTEMI with troponin's rising from 0.76 to 4.48 this am. EKG demonstrating anterior ischemia.        She states that she has been feeling tired and having a lack of energy for 2 months prior to admission. She states that she has just not felt right. After preparing food for herself yesterday, she began to have chest pressure, substernal that was severe. She described sharpness, diaphoresis, and some dyspnea. She drove herself to the hospital because she felt so bad. Stating that she felt too weak to call EMS. She also states that over the last couple of months she has been having spikes in her BP, although she is compliant with her medications.        In ER she was found to be hypertensive, 175/114, HR 99. O2 Sat ((5, afebrile. Initial troponin and labs were unremarkable. CXR was negative for CHF or Pneumonia. EKG demonstrated NSR with non-specific ST changes in the anterior lateral leads. Follow up EKG this am demonstrates anterior abnormalities. Telemetry demonstrates ST depression. She has been loaded with 300 mg Plavix and is now on 75 mg daily.      She was treated with 324 mg ASA, NTG X 3, gabapentin and propanolol. She had chest pressure relieved. Troponin has risen to 4.48 this am. I have started heparin. She is also on plavix, ASA, metoprolol, and atorvastatin.   Allergies  Allergen Reactions  . Demerol [Meperidine] Nausea And Vomiting  . Lodine [Etodolac] Rash    Medications Scheduled  Medications: . aspirin  81 mg Oral Daily  . atorvastatin  80 mg Oral q1800  . clopidogrel  75 mg Oral Daily  . gabapentin  300 mg Oral Daily  . metoprolol tartrate  25 mg Oral TID      PRN Medications: acetaminophen, nitroGLYCERIN, ondansetron (ZOFRAN) IV   Past Medical History  Diagnosis Date  . Hypertension   . Hip pain   . Thyroid disease     Past Surgical History  Procedure Laterality Date  . Tubal ligation    . Abdominal hysterectomy    . Cholecystectomy    . Shoulder arthroscopy    . Heel spurs      Family History  Problem Relation Age of Onset  . Heart failure Father   . Stroke Sister   . Heart failure Brother   . Cancer Brother     Social History Maria Fry reports that she has never smoked. She has never used smokeless tobacco. Maria Fry reports that she does not drink alcohol.  Review of Systems Complete review of systems are found to be negative unless outlined in H&P above.  Physical Examination Blood pressure 134/75, pulse 72, temperature 97.9 F (36.6 C), temperature source Oral, resp. rate 20, height 5\' 5"  (1.651 m), weight 178 lb (80.74 kg), SpO2 98 %. No intake or output data in the 24 hours ending 06/06/15 0834  Telemetry: NSR with ST depression in the anterior leads.   GEN: Tired in no acute distress HEENT: Conjunctiva and lids normal, oropharynx clear with moist mucosa.  Neck: Supple, no elevated JVP or carotid bruits, no thyromegaly. Lungs: Clear to auscultation, nonlabored breathing at rest. Cardiac: Regular rate and rhythm, 1/6 systolic murmur,, no pericardial rub. Abdomen: Soft, nontender, no hepatomegaly, bowel sounds present, no guarding or rebound. Extremities: No pitting edema, distal pulses 2+. Skin: Warm and dry. Musculoskeletal: No kyphosis. Neuropsychiatric: Alert and oriented x3, affect grossly appropriate.  Prior Cardiac Testing/Procedures None  Lab Results  Basic Metabolic Panel:  Recent Labs Lab 06/05/15 1830   NA 136  K 3.8  CL 106  CO2 22  GLUCOSE 114*  BUN 14  CREATININE 0.81  CALCIUM 8.6*    Liver Function Tests:  Recent Labs Lab 06/05/15 1830  AST 23  ALT 16  ALKPHOS 94  BILITOT 0.7  PROT 7.9  ALBUMIN 3.7    CBC:  Recent Labs Lab 06/05/15 1830  WBC 8.1  HGB 12.5  HCT 37.0  MCV 86.2  PLT 292    Cardiac Enzymes:  Recent Labs Lab 06/05/15 1830 06/05/15 2206 06/06/15 0020 06/06/15 0347 06/06/15 0644  TROPONINI <0.03 0.76* 1.28* 2.76* 4.48*     Radiology: Dg Chest Portable 1 View  06/05/2015   CLINICAL DATA:  Central sharp chest pain. Heaviness. Duration of symptoms 1 alar.  EXAM: PORTABLE CHEST - 1 VIEW  COMPARISON:  11/04/2014  FINDINGS: The heart is mildly enlarged. The aorta is unfolded. Lungs appear clear allowing for technical factors. Vascularity is normal. No effusions. No bony abnormalities.  IMPRESSION: Under penetrated.  No active disease identified.   Electronically Signed   By: Nelson Chimes M.D.   On: 06/05/2015 18:58     ECG: NSR with anterior ST-T wave abnormalies.    Impression and Recommendations  1. NSTEMI: Troponin is rising, from initial admission levels. She is pain free but has anterior ST abnormalities. I have started her on Heparin and have discussed need for cardiac cath with the patient to include risk, benefits and description of the procedure. She is willing to proceed. I will discuss further with Dr, Bronson Ing before initiating transfer to [facility] for procedure. Continue to keep NPO, statin, ASA, and BB. Begin low dose ACE.   2. Hypertension: BP is controlled currently with current medication regimen. Will add low dose ACE lisinopril 2.5 mg daily.   3. Hyperthyroidism: Followed by Dr. [name]. Will check level with follow up labs.   4. Obesity: Lifestyle modifications post discharge with rehab are recommended.    Signed: Phill Myron. Lawrence NP [place]  06/06/2015, 8:34 AM   The patient was seen and examined, and I agree with the  assessment and plan as documented above, with modifications as noted below. 64 yr old patient with aforementioned past medical history (HTN, tobacco abuse) who experienced the sudden onset of retrosternal chest pain and fatigue at roughly 5:45 pm yesterday while sitting down. Upon further discussion, she has been experiencing progressive exertional dyspnea over the past two months, accompanied by leg swelling which she has attributed to arthritis. Initial ECG showed nonspecific ST-T abnormalities in anterior and lateral leads with most recent ECG also demonstrating inferior T wave inversions. Troponins are now elevated up to 5.76. She is ASA, metoprolol, Lipitor, and heparin. Was given 600 mg Plavix load early this morning. One 8-beat run of asymptomatic non-sustained ventricular tachycardia noted. Arrangements have been made to transfer to [facility] where Dr. Fletcher Anon will perform coronary angiography. Patient and family members are understanding.   Co-Sign MD

## 2015-06-06 NOTE — Interval H&P Note (Signed)
History and Physical Interval Note:  06/06/2015 4:41 PM  Maria Fry  has presented today for surgery, with the diagnosis of cp  The various methods of treatment have been discussed with the patient and family. After consideration of risks, benefits and other options for treatment, the patient has consented to  Procedure(s): Left Heart Cath and Coronary Angiography (N/A) as a surgical intervention .  The patient's history has been reviewed, patient examined, no change in status, stable for surgery.  I have reviewed the patient's chart and labs.  Questions were answered to the patient's satisfaction.    Cath Lab Visit (complete for each Cath Lab visit)  Clinical Evaluation Leading to the Procedure:   ACS: Yes.    Non-ACS:    Anginal Classification: CCS IV  Anti-ischemic medical therapy: Minimal Therapy (1 class of medications)  Non-Invasive Test Results: No non-invasive testing performed  Prior CABG: No previous CABG       Collier [facility]ina [facility] 06/06/2015 4:41 PM

## 2015-06-06 NOTE — Progress Notes (Signed)
[place] for Heparin Indication: chest pain/ACS / elevated troponin  Allergies  Allergen Reactions  . Demerol [Meperidine] Nausea And Vomiting  . Lodine [Etodolac] Rash   Patient Measurements: Height: 5\' 5"  (165.1 cm) Weight: 178 lb (80.74 kg) IBW/kg (Calculated) : 57  HEPARIN DW (KG): 74.1  Vital Signs: Temp: 97.9 F (36.6 C) (07/05 0548) Temp Source: Oral (07/05 0548) BP: 134/75 mmHg (07/05 0548) Pulse Rate: 72 (07/05 0548)  Labs:  Recent Labs  06/05/15 1830  06/06/15 0020 06/06/15 0347 06/06/15 0644  HGB 12.5  --   --   --   --   HCT 37.0  --   --   --   --   PLT 292  --   --   --   --   CREATININE 0.81  --   --   --   --   TROPONINI <0.03  < > 1.28* 2.76* 4.48*  < > = values in this interval not displayed. Estimated Creatinine Clearance: 76.6 mL/min (by C-G formula based on Cr of 0.81).  Medical History: Past Medical History  Diagnosis Date  . Hypertension   . Hip pain   . Thyroid disease    Assessment: 64yo female admitted with c/o chest pain, pt has strong family h/o CAD.  Troponins elevated and cardiology recommends transition to IV Heparin for ACS. Pt received total of Lovenox 80mg  SQ last night. Baseline Coag Labs pending.  Goal of Therapy:  Hep Level 0.3 -0.7 Monitor platelets by anticoagulation protocol: Yes   Plan:  No Bolus Heparin drip @ 900 units/hr Hep Level in 6-8 hours, then daily Monitor CBC daily.  Pricilla Larsson 06/06/2015,8:22 AM

## 2015-06-06 NOTE — H&P (View-Only) (Signed)
CARDIOLOGY CONSULT NOTE   Patient ID: COUNTESS BIEBEL MRN: [id number] DOB/AGE: 1951/08/12 64 y.o.  Admit Date: 06/05/2015 Referring Physician: Marjean Donna MD Primary Physician: Lanette Hampshire, MD Consulting Cardiologist: Kate Sable MD Primary Cardiologist: New Reason for Consultation: NSTEMI  Clinical Summary Ms. Segundo is a 64 y.o.female known history of hypertension, tobacco abuse, hyperthyroid,  admitted with chest pain described as tightness and pressure who has been ruled in for NSTEMI with troponin's rising from 0.76 to 4.48 this am. EKG demonstrating anterior ischemia.        She states that she has been feeling tired and having a lack of energy for 2 months prior to admission. She states that she has just not felt right. After preparing food for herself yesterday, she began to have chest pressure, substernal that was severe. She described sharpness, diaphoresis, and some dyspnea. She drove herself to the hospital because she felt so bad. Stating that she felt too weak to call EMS. She also states that over the last couple of months she has been having spikes in her BP, although she is compliant with her medications.        In ER she was found to be hypertensive, 175/114, HR 99. O2 Sat ((5, afebrile. Initial troponin and labs were unremarkable. CXR was negative for CHF or Pneumonia. EKG demonstrated NSR with non-specific ST changes in the anterior lateral leads. Follow up EKG this am demonstrates anterior abnormalities. Telemetry demonstrates ST depression. She has been loaded with 300 mg Plavix and is now on 75 mg daily.      She was treated with 324 mg ASA, NTG X 3, gabapentin and propanolol. She had chest pressure relieved. Troponin has risen to 4.48 this am. I have started heparin. She is also on plavix, ASA, metoprolol, and atorvastatin.   Allergies  Allergen Reactions  . Demerol [Meperidine] Nausea And Vomiting  . Lodine [Etodolac] Rash    Medications Scheduled  Medications: . aspirin  81 mg Oral Daily  . atorvastatin  80 mg Oral q1800  . clopidogrel  75 mg Oral Daily  . gabapentin  300 mg Oral Daily  . metoprolol tartrate  25 mg Oral TID      PRN Medications: acetaminophen, nitroGLYCERIN, ondansetron (ZOFRAN) IV   Past Medical History  Diagnosis Date  . Hypertension   . Hip pain   . Thyroid disease     Past Surgical History  Procedure Laterality Date  . Tubal ligation    . Abdominal hysterectomy    . Cholecystectomy    . Shoulder arthroscopy    . Heel spurs      Family History  Problem Relation Age of Onset  . Heart failure Father   . Stroke Sister   . Heart failure Brother   . Cancer Brother     Social History Ms. Collard reports that she has never smoked. She has never used smokeless tobacco. Ms. Brodrick reports that she does not drink alcohol.  Review of Systems Complete review of systems are found to be negative unless outlined in H&P above.  Physical Examination Blood pressure 134/75, pulse 72, temperature 97.9 F (36.6 C), temperature source Oral, resp. rate 20, height 5\' 5"  (1.651 m), weight 178 lb (80.74 kg), SpO2 98 %. No intake or output data in the 24 hours ending 06/06/15 0834  Telemetry: NSR with ST depression in the anterior leads.   GEN: Tired in no acute distress HEENT: Conjunctiva and lids normal, oropharynx clear with moist mucosa.  Neck: Supple, no elevated JVP or carotid bruits, no thyromegaly. Lungs: Clear to auscultation, nonlabored breathing at rest. Cardiac: Regular rate and rhythm, 1/6 systolic murmur,, no pericardial rub. Abdomen: Soft, nontender, no hepatomegaly, bowel sounds present, no guarding or rebound. Extremities: No pitting edema, distal pulses 2+. Skin: Warm and dry. Musculoskeletal: No kyphosis. Neuropsychiatric: Alert and oriented x3, affect grossly appropriate.  Prior Cardiac Testing/Procedures None  Lab Results  Basic Metabolic Panel:  Recent Labs Lab 06/05/15 1830    NA 136  K 3.8  CL 106  CO2 22  GLUCOSE 114*  BUN 14  CREATININE 0.81  CALCIUM 8.6*    Liver Function Tests:  Recent Labs Lab 06/05/15 1830  AST 23  ALT 16  ALKPHOS 94  BILITOT 0.7  PROT 7.9  ALBUMIN 3.7    CBC:  Recent Labs Lab 06/05/15 1830  WBC 8.1  HGB 12.5  HCT 37.0  MCV 86.2  PLT 292    Cardiac Enzymes:  Recent Labs Lab 06/05/15 1830 06/05/15 2206 06/06/15 0020 06/06/15 0347 06/06/15 0644  TROPONINI <0.03 0.76* 1.28* 2.76* 4.48*     Radiology: Dg Chest Portable 1 View  06/05/2015   CLINICAL DATA:  Central sharp chest pain. Heaviness. Duration of symptoms 1 alar.  EXAM: PORTABLE CHEST - 1 VIEW  COMPARISON:  11/04/2014  FINDINGS: The heart is mildly enlarged. The aorta is unfolded. Lungs appear clear allowing for technical factors. Vascularity is normal. No effusions. No bony abnormalities.  IMPRESSION: Under penetrated.  No active disease identified.   Electronically Signed   By: Nelson Chimes M.D.   On: 06/05/2015 18:58     ECG: NSR with anterior ST-T wave abnormalies.    Impression and Recommendations  1. NSTEMI: Troponin is rising, from initial admission levels. She is pain free but has anterior ST abnormalities. I have started her on Heparin and have discussed need for cardiac cath with the patient to include risk, benefits and description of the procedure. She is willing to proceed. I will discuss further with Dr, Bronson Ing before initiating transfer to [facility] for procedure. Continue to keep NPO, statin, ASA, and BB. Begin low dose ACE.   2. Hypertension: BP is controlled currently with current medication regimen. Will add low dose ACE lisinopril 2.5 mg daily.   3. Hyperthyroidism: Followed by Dr. [name]. Will check level with follow up labs.   4. Obesity: Lifestyle modifications post discharge with rehab are recommended.    Signed: Phill Myron. Lawrence NP [place]  06/06/2015, 8:34 AM   The patient was seen and examined, and I agree with the  assessment and plan as documented above, with modifications as noted below. 64 yr old patient with aforementioned past medical history (HTN, tobacco abuse) who experienced the sudden onset of retrosternal chest pain and fatigue at roughly 5:45 pm yesterday while sitting down. Upon further discussion, she has been experiencing progressive exertional dyspnea over the past two months, accompanied by leg swelling which she has attributed to arthritis. Initial ECG showed nonspecific ST-T abnormalities in anterior and lateral leads with most recent ECG also demonstrating inferior T wave inversions. Troponins are now elevated up to 5.76. She is ASA, metoprolol, Lipitor, and heparin. Was given 600 mg Plavix load early this morning. One 8-beat run of asymptomatic non-sustained ventricular tachycardia noted. Arrangements have been made to transfer to [facility] where Dr. Fletcher Anon will perform coronary angiography. Patient and family members are understanding.   Co-Sign MD

## 2015-06-06 NOTE — Plan of Care (Signed)
On Call Cardiology   -- This is a phone evaluation and recommendation only --  Patient is a 64 yo with history of HTN and thyroid disease (hypo or hyper?) admitted earlier today at [facility] with CP. She has ruled in for NSTEMI. Otherwise CP free and stable vitals. She has received ASA 324 mg and Lovenox 40 mg sq. She has been started on Metoprolol. ECG NSST changes  A:   1. NSTEMI - stable vitals   P:  Recommend ASA 81 mg po qd, start Plavix load 600 mg po x1 then 75 mg po qd, high potency statin like Lipitor 80 mg po qd. Change Lovenox dose from 40 mg sq qd to 1 mg/kg q12hr. Continue Metoprolol. Check echo in am. Keep NPO except meds. Since patient is currently CP free and sleeping comfortably with stable vitals, I do not feel the need for urgent transfer of this patient from [facility] to [facility] here tonight.   Discussed this plan with Dr. Darrick Meigs on the phone.  He will contact me if situation changes.   Wandra Mannan, MD Cardiology

## 2015-06-06 NOTE — Progress Notes (Signed)
Dr Darrick Meigs made aware of TROPONIN #3( @ [phone]) was 2.76, no new order, continue with current treatment plan.

## 2015-06-06 NOTE — Progress Notes (Signed)
ANTICOAGULATION CONSULT NOTE - Initial Consult  Pharmacy Consult for Lovenox Indication: chest pain/ACS / elevated troponin  Allergies  Allergen Reactions  . Demerol [Meperidine] Nausea And Vomiting  . Lodine [Etodolac] Rash   Patient Measurements: Height: 5\' 5"  (165.1 cm) Weight: 178 lb (80.74 kg) IBW/kg (Calculated) : 57  Vital Signs: Temp: 98.6 F (37 C) (07/05 0151) Temp Source: Oral (07/05 0151) BP: 139/77 mmHg (07/05 0151) Pulse Rate: 75 (07/05 0151)  Labs:  Recent Labs  06/05/15 1830 06/05/15 2206 06/06/15 0020  HGB 12.5  --   --   HCT 37.0  --   --   PLT 292  --   --   CREATININE 0.81  --   --   TROPONINI <0.03 0.76* 1.28*   Estimated Creatinine Clearance: 76.6 mL/min (by C-G formula based on Cr of 0.81).  Medical History: Past Medical History  Diagnosis Date  . Hypertension   . Hip pain   . Thyroid disease    Assessment: 64yo female admitted with c/o chest pain, pt has strong family h/o CAD.  Troponins elevated and cardiology recommends to start full dose Lovenox for ACS. Pt received Lovenox 40mg  SQ ~ 12 midnight. Pt has good renal fxn.  Goal of Therapy:  Anti-Xa level 0.6-1 units/ml 4hrs after LMWH dose given Monitor platelets by anticoagulation protocol: Yes   Plan:  Lovenox 40mg  SQ x 1 now, extra dose to make 80mg  total Lovenox 80mg  SQ q12hrs starting in am Monitor CBC  [place] Crane, Mishaal Lansdale A 06/06/2015,2:30 AM

## 2015-06-06 NOTE — Progress Notes (Signed)
Pt transferred to [facility] today per Dr. Jacinta Shoe.  Pt VSS.  IV to RAC patent with heparin gtt infusing.  Report given to [facility].  Verbalized understanding.  Pt left floor via stretcher in stable condition accompanied by [place] staff.  Family at bedside.

## 2015-06-06 NOTE — Progress Notes (Signed)
Pt ran 8 beat run of [facility] on telemetry.  Dr. Jacinta Shoe, at the bedside, made aware.  Pt asymptomatic.  Will continue to monitor.

## 2015-06-07 ENCOUNTER — Telehealth: Payer: Self-pay | Admitting: Cardiovascular Disease

## 2015-06-07 ENCOUNTER — Encounter (HOSPITAL_COMMUNITY): Payer: Self-pay | Admitting: Physician Assistant

## 2015-06-07 DIAGNOSIS — I2510 Atherosclerotic heart disease of native coronary artery without angina pectoris: Secondary | ICD-10-CM | POA: Diagnosis present

## 2015-06-07 DIAGNOSIS — I214 Non-ST elevation (NSTEMI) myocardial infarction: Principal | ICD-10-CM

## 2015-06-07 DIAGNOSIS — I10 Essential (primary) hypertension: Secondary | ICD-10-CM

## 2015-06-07 DIAGNOSIS — E0590 Thyrotoxicosis, unspecified without thyrotoxic crisis or storm: Secondary | ICD-10-CM | POA: Diagnosis present

## 2015-06-07 LAB — BASIC METABOLIC PANEL
Anion gap: 5 (ref 5–15)
BUN: 9 mg/dL (ref 6–20)
CO2: 27 mmol/L (ref 22–32)
Calcium: 8.8 mg/dL — ABNORMAL LOW (ref 8.9–10.3)
Chloride: 106 mmol/L (ref 101–111)
Creatinine, Ser: 0.66 mg/dL (ref 0.44–1.00)
GFR calc Af Amer: >60 mL/min (ref >60)
GFR calc non Af Amer: >60 mL/min (ref >60)
Glucose, Bld: 108 mg/dL — ABNORMAL HIGH (ref 65–99)
Potassium: 3.3 mmol/L — ABNORMAL LOW (ref 3.5–5.1)
Sodium: 138 mmol/L (ref 135–145)

## 2015-06-07 LAB — CBC
HCT: 35.3 % — ABNORMAL LOW (ref 36.0–46.0)
Hemoglobin: 11.7 g/dL — ABNORMAL LOW (ref 12.0–15.0)
MCH: 28.4 pg (ref 26.0–34.0)
MCHC: 33.1 g/dL (ref 30.0–36.0)
MCV: 85.7 fL (ref 78.0–100.0)
Platelets: 255 10*3/uL (ref 150–400)
RBC: 4.12 MIL/uL (ref 3.87–5.11)
RDW: 14.2 % (ref 11.5–15.5)
WBC: 7.3 10*3/uL (ref 4.0–10.5)

## 2015-06-07 LAB — HEMOGLOBIN A1C
Hgb A1c MFr Bld: 5.9 % — ABNORMAL HIGH (ref 4.8–5.6)
Hgb A1c MFr Bld: 5.8 % — ABNORMAL HIGH (ref 4.8–5.6)
Mean Plasma Glucose: 123 mg/dL
Mean Plasma Glucose: 120 mg/dL

## 2015-06-07 LAB — T4, FREE: Free T4: 1.45 ng/dL — ABNORMAL HIGH (ref 0.61–1.12)

## 2015-06-07 LAB — MAGNESIUM: Magnesium: 2 mg/dL (ref 1.7–2.4)

## 2015-06-07 MED ORDER — LISINOPRIL 10 MG PO TABS
10.0000 mg | ORAL_TABLET | Freq: Every day | ORAL | Status: DC
  Administered 2015-06-07: 10 mg via ORAL
  Filled 2015-06-07: qty 1

## 2015-06-07 MED ORDER — ASPIRIN 81 MG PO CHEW: 81.0000 mg | CHEWABLE_TABLET | Freq: Every day | ORAL | Status: DC

## 2015-06-07 MED ORDER — ATORVASTATIN CALCIUM 80 MG PO TABS: 80.0000 mg | ORAL_TABLET | Freq: Every day | ORAL | Status: DC

## 2015-06-07 MED ORDER — LISINOPRIL 10 MG PO TABS: 10.0000 mg | ORAL_TABLET | Freq: Every day | ORAL | Status: DC

## 2015-06-07 MED ORDER — METHIMAZOLE 5 MG PO TABS: 5.0000 mg | ORAL_TABLET | Freq: Two times a day (BID) | ORAL | Status: DC

## 2015-06-07 MED ORDER — CLOPIDOGREL BISULFATE 75 MG PO TABS: 75.0000 mg | ORAL_TABLET | Freq: Every day | ORAL | Status: DC

## 2015-06-07 MED ORDER — POTASSIUM CHLORIDE CRYS ER 20 MEQ PO TBCR
40.0000 meq | EXTENDED_RELEASE_TABLET | Freq: Once | ORAL | Status: AC
  Administered 2015-06-07: 11:00:00 40 meq via ORAL
  Filled 2015-06-07: qty 2

## 2015-06-07 MED ORDER — METHIMAZOLE 5 MG PO TABS
5.0000 mg | ORAL_TABLET | Freq: Two times a day (BID) | ORAL | Status: DC
  Administered 2015-06-07: 12:00:00 5 mg via ORAL
  Filled 2015-06-07 (×2): qty 1

## 2015-06-07 MED ORDER — METOPROLOL TARTRATE 25 MG PO TABS: ORAL_TABLET | ORAL | Status: DC

## 2015-06-07 MED ORDER — CLOPIDOGREL BISULFATE 75 MG PO TABS
75.0000 mg | ORAL_TABLET | Freq: Every day | ORAL | Status: DC
  Administered 2015-06-07: 75 mg via ORAL
  Filled 2015-06-07: qty 1

## 2015-06-07 MED ORDER — NITROGLYCERIN 0.4 MG SL SUBL: 0.4000 mg | SUBLINGUAL_TABLET | SUBLINGUAL | Status: DC | PRN

## 2015-06-07 NOTE — Telephone Encounter (Signed)
I was only able to LM as pt's VM full on cell,and had to leave message at home number with apt reminder,pt still hospitalized

## 2015-06-07 NOTE — Telephone Encounter (Signed)
TCM  Patient is scheduled on July 12th

## 2015-06-07 NOTE — Discharge Summary (Signed)
Discharge Summary   Patient ID: Maria Fry,  MRN: [id number], DOB/AGE: 62-10-1951 62 y.o.  Admit date: 06/05/2015 Discharge date: 06/07/2015  Primary Care Provider: Lanette Hampshire Primary Cardiologist: Dr. Bronson Ing ([place])  Discharge Diagnoses Principal Problem:   CAD (coronary artery disease) Active Problems:   Chest pain   Hypertension   NSTEMI (non-ST elevated myocardial infarction)   Hyperthyroidism   Allergies Allergies  Allergen Reactions  . Demerol [Meperidine] Nausea And Vomiting  . Lodine [Etodolac] Rash    Procedures  Echo 06/06/15 LV EF: 50% -  55%  ------------------------------------------------------------------- Indications:   Chest pain 786.51.  ------------------------------------------------------------------- History:  Risk factors: Hypertension.  ------------------------------------------------------------------- Study Conclusions  - Procedure narrative: Images were fair to suboptimal, with poor valvular and endocardial visualization. - Left ventricle: The cavity size was mildly dilated. Systolic function was low normal. The estimated ejection fraction was approximately 50%. Images were inadequate for LV wall motion assessment. Possible mild apical hypokinesis. Findings consistent with left ventricular diastolic dysfunction. Indeterminate filling pressures. Mild concentric and moderate focal basal septal hypertrophy. - Aortic valve: There was mild regurgitation. - Mitral valve: There was moderate, eccentric regurgitation. - Left atrium: The atrium was mildly dilated. - Tricuspid valve: There was mild regurgitation.  Cath 06/06/15 Conclusion     Dist LAD lesion, 100% stenosed.  Mid LAD lesion, 10% stenosed.  The left ventricular systolic function is normal.  1. 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 Good LV function  Plan: DAPT, beta blocker and risk factor modification.           History of Present Illness  Maria Fry is a 64 y.o.female known history of hypertension, former tobacco abuse and hyperthyroidism who admitted 06/05/15 to [facility] with chest pain described as tightness and pressure who has been ruled in for NSTEMI with troponin's rising from 0.76 to 4.48. EKG demonstrating anterior ischemia.   She stated that she has been feeling tired and having a lack of energy for 2 months prior to admission. After preparing food for herself 7/4, she began to have a severe substernal chest pressure. She also had sharpness, diaphoresis, and some dyspnea. She drove herself to the hospital because she felt so bad. She also stated that over the last couple of months she has been having spikes in her BP, although she is compliant with her medications.    In ER @ [facility], she was found to be hypertensive, 175/114, HR 99. O2 Sat 93, afebrile. Initial troponin and labs were unremarkable. CXR was negative for CHF or Pneumonia. EKG demonstrated NSR with non-specific ST changes in the anterior lateral and lateral leads. Her home propranolol changed to metoprolol 25mg  TID. Follow up EKG 7/5 demonstrated inferior T wave inversions. Telemetry demonstrated ST depression. She has been loaded with 300 mg Plavix and  75 mg daily.   She was treated with 324 mg ASA, NTG X 3l. Her chest pressure was elieved. However her troponin has risen to 4.48 --> 5.76. She was started on heparin gtt and transfer to [facility] to emergent coronary angiography. She is also on plavix, ASA, metoprolol, and atorvastatin. Patient also had two separate episode (7:10am and (:17am) of a 8 beats run of NSVT and one episode of 7 beats runs of NSVT 10:24am prior to transfer, she was asymptomatic at that time.   Hospital Course  Patient was transferred via [place]. Cath showed single vessel occlusive CAD involving the very distal LAD, 100% stenosed; and good LV function. No  intervention done.  Echo showed LV EF of  50-55%; mild LVH; mild apical hypokinesis; mild aortic regur; moderate eccentric regur; and mild tri regurgi. Overnight no further episode of chest pain or SOB. Patient felt occasional palpitations, likely related to ischemia and/or hyperthyroidism. Her BP was relatively stable during admission. BNP elevated, however, no clinical signs of CHF. She did no given any diuretics. No Further episode of NSVT. Magnesium level was normal. HgbA1C was 5.8. Creatine normal post cath. She was given supplement for low potassium. Hx of hyperthyroidism, however hasn't f/u with Dr. [name] over more than year due to cost. TSH <0.01 and free t4 is 1.45. Added 5 mg tapazole BID today. She was advised to follow-up with Dr. [name] in [place] after discharge. Lisinopril was added. Pt walked 430 ft with slow steady gait with cardiac rehab phase I and plan to f/u [place] for CRP2. She was discharged stably and plan to f/u in [place] with Dr. Bronson Ing. She was advised to take metoprolol 1.5 tables in morning and evening of 25mg  for better compliance rather then TID. Consider switch to long acting acting as outpatient. Plan for DAPT for 1 year.   Discharge Vitals Blood pressure 130/67, pulse 72, temperature 97.7 F (36.5 C), temperature source Oral, resp. rate 20, height 5\' 5"  (1.651 m), weight 184 lb 8.4 oz (83.7 kg), SpO2 94 %.  Filed Weights   06/05/15 1814 06/07/15 0400  Weight: 178 lb (80.74 kg) 184 lb 8.4 oz (83.7 kg)    Labs  CBC  Recent Labs  06/05/15 1830 06/07/15 0226  WBC 8.1 7.3  HGB 12.5 11.7*  HCT 37.0 35.3*  MCV 86.2 85.7  PLT 292 025   Basic Metabolic Panel  Recent Labs  06/05/15 1830 06/07/15 0826  NA 136 138  K 3.8 3.3*  CL 106 106  CO2 22 27  GLUCOSE 114* 108*  BUN 14 9  CREATININE 0.81 0.66  CALCIUM 8.6* 8.8*  MG  --  2.0   Liver Function Tests  Recent Labs  06/05/15 1830  AST 23  ALT 16  ALKPHOS 94  BILITOT 0.7  PROT 7.9  ALBUMIN 3.7   Cardiac Enzymes  Recent  Labs  06/06/15 0347 06/06/15 0644 06/06/15 0902  TROPONINI 2.76* 4.48* 5.76*    Hemoglobin A1C  Recent Labs  06/06/15 0902  HGBA1C 5.8*   Fasting Lipid Panel  Recent Labs  06/06/15 0902  CHOL 145  HDL 34*  LDLCALC 101*  TRIG 49  CHOLHDL 4.3   Thyroid Function Tests  Recent Labs  06/06/15 0902  TSH <0.010*    Disposition  Pt is being discharged home today in good condition.  Follow-up Plans & Appointments  Follow-up Information    Follow up with [name], MD On 06/13/2015.   Specialty:  Cardiology   Why:  @1 :40   Contact information:   [address] [place] [address] [phone]       Follow up with Dr. [name].   Contact information:   Schedule appoitment with Dr. [name] as soon as possible for hyperthyroidism - your labs was abnormal and you placed on new medication.           Discharge Instructions    Amb Referral to Cardiac Rehabilitation    Complete by:  As directed   Congestive Heart Failure: If diagnosis is Heart Failure, patient MUST meet each of the CMS criteria: 1. Left Ventricular Ejection Fraction </= 35% 2. NYHA class II-IV symptoms despite being  on optimal heart failure therapy for at least 6 weeks. 3. Stable = have not had a recent (<6 weeks) or planned (<6 months) major cardiovascular hospitalization or procedure  Program Details: - Physician supervised classes - 1-3 classes per week over a 12-18 week period, generally for a total of 36 sessions  Physician Certification: I certify that the above Cardiac Rehabilitation treatment is medically necessary and is medically approved by me for treatment of this patient. The patient is willing and cooperative, able to ambulate and medically stable to participate in exercise rehabilitation. The participant's progress and Individualized Treatment Plan will be reviewed by the Medical Director, Cardiac Rehab staff and as indicated by the Referring/Ordering Physician.  Diagnosis:   Myocardial Infarction     Diet - low sodium heart healthy    Complete by:  As directed      Discharge instructions    Complete by:  As directed   Take evening dose to Methimazole and metoprolol tonight (06/07/15). Start lipitor today (06/07/15). Start aspirin and Plavix tomorrow (06/08/15).  NO HEAVY LIFTING (>10lbs) X 2 WEEKS. NO SEXUAL ACTIVITY X 2 WEEKS. NO DRIVING X 1 WEEK. NO SOAKING BATHS, HOT TUBS, POOLS, ETC., X 7 DAYS.  Patients taking Plavix and Aspirin should generally stay away from medicines like ibuprofen, Advil, Motrin, naproxen, and Aleve due to risk of stomach bleeding. You may take Tylenol as directed or talk to your primary doctor about alternatives.     Increase activity slowly    Complete by:  As directed            F/u Labs/Studies  - BMET as patient started on lisinopril on this admission.  - Consider OP f/u labs 6-8 weeks given statin initiation this admission.  Discharge Medications    Medication List    STOP taking these medications        GOODYS BODY PAIN PO     ibuprofen 200 MG tablet  Commonly known as:  ADVIL,MOTRIN     propranolol 20 MG tablet  Commonly known as:  INDERAL      TAKE these medications        aspirin 81 MG chewable tablet  Chew 1 tablet (81 mg total) by mouth daily.  Start taking on:  06/08/2015     atorvastatin 80 MG tablet  Commonly known as:  LIPITOR  Take 1 tablet (80 mg total) by mouth daily at 6 PM.     clopidogrel 75 MG tablet  Commonly known as:  PLAVIX  Take 1 tablet (75 mg total) by mouth daily.  Start taking on:  06/08/2015     gabapentin 100 MG capsule  Commonly known as:  NEURONTIN  Take 300 mg by mouth daily.     HYDROcodone-acetaminophen 5-325 MG per tablet  Commonly known as:  NORCO/VICODIN  Take 1 tablet by mouth every 4 (four) hours as needed.     lisinopril 10 MG tablet  Commonly known as:  PRINIVIL,ZESTRIL  Take 1 tablet (10 mg total) by mouth daily.  Start taking on:  06/08/2015     methimazole 5  MG tablet  Commonly known as:  TAPAZOLE  Take 1 tablet (5 mg total) by mouth 2 (two) times daily.     metoprolol tartrate 25 MG tablet  Commonly known as:  LOPRESSOR  Take 1 1/2 (total 37.5mg ) table by mouth in morning and evening.     nitroGLYCERIN 0.4 MG SL tablet  Commonly known as:  NITROSTAT  Place 1 tablet (0.4  mg total) under the tongue every 5 (five) minutes as needed for chest pain.        Duration of Discharge Encounter   Greater than 30 minutes including physician time.  Signed, Soren Lazarz PA-C 06/07/2015, 2:20 PM

## 2015-06-07 NOTE — Progress Notes (Signed)
Patient complains of an intermittent headache since admission. States she usually has one cup of coffee in the am, caffeine. Given a cup of regular caffeine coffee. Patient states her headache is gone and she feels much better. Discussed limited use of caffeine with patient with good verbalized understanding.

## 2015-06-07 NOTE — Progress Notes (Signed)
CARDIAC REHAB PHASE I   PRE:  Rate/Rhythm: 77 SR  BP:  Supine: 130/67  Sitting:   Standing:    SaO2:   MODE:  Ambulation: 430 ft   POST:  Rate/Rhythm: 84 SR PACs  BP:  Supine: 148/91  Sitting:   Standing:    SaO2:  [id number] Pt walked 430 ft with slow steady gait. Had to stop half way to rest due to SOB. Denied CP. Pt stated has been on disability for pinched nerve since 2013 and had falls without warning at that time due to it. Encouraged pt to increase walking slowly and stop to catch her breath as needed. Discussed CRP 2 and permission given to refer to [place]. Reviewed NTG use, plavix, diet and MI restrictions. Pt voiced understanding.    Graylon Good, RN BSN  06/07/2015 9:40 AM

## 2015-06-07 NOTE — Progress Notes (Signed)
Patient Name: Maria Fry Date of Encounter: 06/07/2015  Active Problems:   Chest pain   Hypertension   NSTEMI (non-ST elevated myocardial infarction)  SUBJECTIVE  Denies chest pain, sob or palpitaion  CURRENT MEDS . aspirin  81 mg Oral Daily  . atorvastatin  80 mg Oral q1800  . gabapentin  300 mg Oral Daily  . metoprolol tartrate  25 mg Oral TID  . sodium chloride  3 mL Intravenous Q12H    OBJECTIVE  Filed Vitals:   06/06/15 1800 06/06/15 1956 06/07/15 0027 06/07/15 0400  BP: 145/86 153/82 116/60 154/73  Pulse: 64 72 62 69  Temp:  97.9 F (36.6 C) 97.9 F (36.6 C) 97.8 F (36.6 C)  TempSrc:   Oral Oral  Resp: 13 17 18 20   Height:      Weight:    184 lb 8.4 oz (83.7 kg)  SpO2: 98% 99% 94% 94%    Intake/Output Summary (Last 24 hours) at 06/07/15 0630 Last data filed at 06/06/15 1800  Gross per 24 hour  Intake    160 ml  Output      0 ml  Net    160 ml   Filed Weights   06/05/15 1814 06/07/15 0400  Weight: 178 lb (80.74 kg) 184 lb 8.4 oz (83.7 kg)    PHYSICAL EXAM  General: Pleasant, NAD. Neuro: Alert and oriented X 3. Moves all extremities spontaneously. Psych: Normal affect. HEENT:  Normal  Neck: Supple without bruits or JVD. Lungs:  Resp regular and unlabored, CTA. Heart: RRR no s3, s4, or murmurs. Abdomen: Soft, non-tender, non-distended, BS + x 4.  Extremities: No clubbing, cyanosis or edema. DP/PT/Radials 2+ and equal bilaterally. R radial cath site without erythema or edema.   Accessory Clinical Findings  CBC  Recent Labs  06/05/15 1830 06/07/15 0226  WBC 8.1 7.3  HGB 12.5 11.7*  HCT 37.0 35.3*  MCV 86.2 85.7  PLT 292 179   Basic Metabolic Panel  Recent Labs  06/05/15 1830  NA 136  K 3.8  CL 106  CO2 22  GLUCOSE 114*  BUN 14  CREATININE 0.81  CALCIUM 8.6*   Liver Function Tests  Recent Labs  06/05/15 1830  AST 23  ALT 16  ALKPHOS 94  BILITOT 0.7  PROT 7.9  ALBUMIN 3.7   Cardiac Enzymes  Recent Labs  06/06/15 0347 06/06/15 0644 06/06/15 0902  TROPONINI 2.76* 4.48* 5.76*    Recent Labs  06/06/15 0902  CHOL 145  HDL 34*  LDLCALC 101*  TRIG 49  CHOLHDL 4.3   Thyroid Function Tests  Recent Labs  06/06/15 0902  TSH <0.010*    TELE  NSR at rate of 60-70s. Frequent PVCs.   Echo 06/06/15 LV EF: 50% -  55%  ------------------------------------------------------------------- Indications:   Chest pain 786.51.  ------------------------------------------------------------------- History:  Risk factors: Hypertension.  ------------------------------------------------------------------- Study Conclusions  - Procedure narrative: Images were fair to suboptimal, with poor valvular and endocardial visualization. - Left ventricle: The cavity size was mildly dilated. Systolic function was low normal. The estimated ejection fraction was approximately 50%. Images were inadequate for LV wall motion assessment. Possible mild apical hypokinesis. Findings consistent with left ventricular diastolic dysfunction. Indeterminate filling pressures. Mild concentric and moderate focal basal septal hypertrophy. - Aortic valve: There was mild regurgitation. - Mitral valve: There was moderate, eccentric regurgitation. - Left atrium: The atrium was mildly dilated. - Tricuspid valve: There was mild regurgitation.  Cath 06/06/15 Conclusion  Dist LAD lesion, 100% stenosed.  Mid LAD lesion, 10% stenosed.  The left ventricular systolic function is normal.  1. 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 Good LV function  Plan: DAPT, beta blocker and risk factor modification.     Radiology/Studies  Dg Chest Portable 1 View  06/05/2015   CLINICAL DATA:  Central sharp chest pain. Heaviness. Duration of symptoms 1 alar.  EXAM: PORTABLE CHEST - 1 VIEW  COMPARISON:  11/04/2014  FINDINGS: The heart is mildly enlarged. The aorta is unfolded. Lungs appear  clear allowing for technical factors. Vascularity is normal. No effusions. No bony abnormalities.  IMPRESSION: Under penetrated.  No active disease identified.   Electronically Signed   By: Nelson Chimes M.D.   On: 06/05/2015 18:58    ASSESSMENT AND PLAN   1. NSTEMI - Peak of trop was 5.76. Cath showed single vessel occlusive CAD involving the very distal LAD, 100% stenosed; and good LV function. Echo showed LV EF of 50-55%; mild LVH; mild apical hypokinesis; mild aortic regur; moderate eccentric regur; and mild tri regurgi. - Continue ASA, lipitor 80mg , lopressor 25mg  TID - She was loaded with Plavix 600mg  and then given 75mg  yesterday, however not on schedule dose. Will resume daily dose. - Pending A1C  2. HTN - Relatively stable - Consider adding lisinopril.   3. Hyperthyroidism - Followed by Dr. [name], haven't f/u for over a year due to cost. TSH is <0.010. Will get free T3 and T4.   4. NSVT - Had 8 beats runs of NSVT yesterday @ 7:10am and 9:17am. Also 7 beats runs of NSVT 10:24am. Will get Mag and BMET. Asymptomatic.   Jarrett Soho PA-C Pager [phone]

## 2015-06-08 LAB — T3, FREE: T3, Free: 5.1 pg/mL — ABNORMAL HIGH (ref 2.0–4.4)

## 2015-06-09 ENCOUNTER — Telehealth: Payer: Self-pay | Admitting: Cardiovascular Disease

## 2015-06-09 NOTE — Telephone Encounter (Signed)
Pt states that when she takes her Lisinopril it feels like her throat is closing and that she gets redness in her neck and knots

## 2015-06-09 NOTE — Telephone Encounter (Signed)
Pt saw Dr Everette Rank yesterday and states he asked him to look at her allergy list (states she was allergic years ago to lisinopril) and he did not provide her with any information except to continue lisinopril,which she refuses to take.Marland KitchenShe was stopped on atenolol and is now on Metoprolol only   She is no distress at all

## 2015-06-13 ENCOUNTER — Ambulatory Visit (INDEPENDENT_AMBULATORY_CARE_PROVIDER_SITE_OTHER): Payer: 59 | Admitting: Cardiovascular Disease

## 2015-06-13 ENCOUNTER — Encounter: Payer: Self-pay | Admitting: Cardiovascular Disease

## 2015-06-13 VITALS — BP 128/72 | HR 60 | Ht 65.0 in | Wt 182.6 lb

## 2015-06-13 DIAGNOSIS — I10 Essential (primary) hypertension: Secondary | ICD-10-CM

## 2015-06-13 DIAGNOSIS — Z87898 Personal history of other specified conditions: Secondary | ICD-10-CM | POA: Diagnosis not present

## 2015-06-13 DIAGNOSIS — E785 Hyperlipidemia, unspecified: Secondary | ICD-10-CM

## 2015-06-13 DIAGNOSIS — E784 Other hyperlipidemia: Secondary | ICD-10-CM

## 2015-06-13 DIAGNOSIS — I2510 Atherosclerotic heart disease of native coronary artery without angina pectoris: Secondary | ICD-10-CM | POA: Diagnosis not present

## 2015-06-13 DIAGNOSIS — I214 Non-ST elevation (NSTEMI) myocardial infarction: Secondary | ICD-10-CM | POA: Diagnosis not present

## 2015-06-13 DIAGNOSIS — Z9289 Personal history of other medical treatment: Secondary | ICD-10-CM

## 2015-06-13 DIAGNOSIS — E0590 Thyrotoxicosis, unspecified without thyrotoxic crisis or storm: Secondary | ICD-10-CM

## 2015-06-13 NOTE — Progress Notes (Signed)
Patient ID: Maria Fry, female   DOB: 16-Jun-1951, 64 y.o.   MRN: [id number]      SUBJECTIVE: The patient returns after being hospitalized for a non-STEMI for which she underwent coronary angiography which demonstrated 100% stenosis of the distal LAD and a 10% stenosis of the mid LAD. She is on dual antiplatelet therapy.  Echocardiogram on 7/5 showed mild left ventricular dilatation with low normal left ventricular systolic function, LVEF 01%, with possible mild apical hypokinesis, diastolic dysfunction, mild aortic regurgitation, moderate eccentric mitral regurgitation, and mild tricuspid regurgitation. There was also mild concentric and moderate focal basal septal hypertrophy.  She is feeling better today. She developed angioedema with lisinopril. She is soon to start cardiac rehabilitation. She has yet to see endocrinology for her hypothyroidism. She denies chest pain and shortness of breath. She had diarrhea on Sunday and Monday but has not had any today.  She is trying to Fry weight by eating healthier.  Soc: Quit smoking in 2004.  Review of Systems: As per "subjective", otherwise negative.  Allergies  Allergen Reactions  . Demerol [Meperidine] Nausea And Vomiting  . Lisinopril     Throat swollen  . Lodine [Etodolac] Rash    Current Outpatient Prescriptions  Medication Sig Dispense Refill  . aspirin 81 MG chewable tablet Chew 1 tablet (81 mg total) by mouth daily.    Marland Kitchen atorvastatin (LIPITOR) 80 MG tablet Take 1 tablet (80 mg total) by mouth daily at 6 PM. 30 tablet 12  . clopidogrel (PLAVIX) 75 MG tablet Take 1 tablet (75 mg total) by mouth daily. 30 tablet 6  . gabapentin (NEURONTIN) 100 MG capsule Take 300 mg by mouth daily.     Marland Kitchen HYDROcodone-acetaminophen (NORCO/VICODIN) 5-325 MG per tablet Take 1 tablet by mouth every 4 (four) hours as needed. 20 tablet 0  . methimazole (TAPAZOLE) 5 MG tablet Take 1 tablet (5 mg total) by mouth 2 (two) times daily. 60 tablet 3  . metoprolol  tartrate (LOPRESSOR) 25 MG tablet Take 1 1/2 (total 37.5mg ) table by mouth in morning and evening. 90 tablet 6  . nitroGLYCERIN (NITROSTAT) 0.4 MG SL tablet Place 1 tablet (0.4 mg total) under the tongue every 5 (five) minutes as needed for chest pain. 25 tablet 12   No current facility-administered medications for this visit.    Past Medical History  Diagnosis Date  . Hypertension   . Hip pain   . Thyroid disease   . CAD (coronary artery disease)     a. cath 06/07/15 Dist LAD 100%, mid LAD 10%.    Past Surgical History  Procedure Laterality Date  . Tubal ligation    . Abdominal hysterectomy    . Cholecystectomy    . Shoulder arthroscopy    . Heel spurs    . Cardiac catheterization N/A 06/06/2015    Procedure: Left Heart Cath and Coronary Angiography;  Surgeon: Peter M Martinique, MD;  Location: [facility];  Service: Cardiovascular;  Laterality: N/A;    History   Social History  . Marital Status: Legally Separated    Spouse Name: N/A  . Number of Children: N/A  . Years of Education: N/A   Occupational History  . Not on file.   Social History Main Topics  . Smoking status: Never Smoker   . Smokeless tobacco: Never Used  . Alcohol Use: No  . Drug Use: No  . Sexual Activity: Not on file   Other Topics Concern  . Not on file  Social History Narrative     Filed Vitals:   06/13/15 1329  BP: 128/72  Pulse: 60  Height: 5\' 5"  (1.651 m)  Weight: 182 lb 9.6 oz (82.827 kg)  SpO2: 97%    PHYSICAL EXAM General: NAD HEENT: Normal. Neck: No JVD, no thyromegaly. Lungs: Clear to auscultation bilaterally with normal respiratory effort. CV: Nondisplaced PMI.  Regular rate and rhythm, normal S1/S2, no S3/S4, no murmur. No pretibial or periankle edema.  No carotid bruit.  Normal pedal pulses.  Abdomen: Soft, nontender, obese, no distention.  Neurologic: Alert and oriented x 3.  Psych: Normal affect. Skin: Normal. Musculoskeletal: Normal range of motion, no gross  deformities. Extremities: No clubbing or cyanosis.   ECG: Most recent ECG reviewed.      ASSESSMENT AND PLAN: 1. CAD with NSTEMI and distal 100% LAD stenosis: Symptomatically stable. Continue aspirin, Plavix, Lipitor, and metoprolol. Allergic to ACEI. Soon to start cardiac rehabilitation.  2. Essential HTN: Controlled on metoprolol. No changes.  3. Dyslipidemia: Lipids on 7/5 showed total cholesterol 145, triglycerides 49, HDL low at 34, LDL high at 101. Continue high intensity statin therapy with Lipitor 80 mg daily.  4. Hyperthyroidism: Will make a referral to endocrinology.  Dispo: f/u 3 months.   Kate Sable, M.D., F.A.C.C.

## 2015-06-13 NOTE — Patient Instructions (Signed)
Your physician recommends that you schedule a follow-up appointment in: 3 months Dr Bronson Ing   Your physician recommends that you continue on your current medications as directed. Please refer to the Current Medication list given to you today.    You have been referred to Dr [name], endocrinologist     Thank you for choosing [facility] !

## 2015-06-14 NOTE — Addendum Note (Signed)
Addended by: Barbarann Ehlers A on: 06/14/2015 10:44 AM   Modules accepted: Orders

## 2015-06-29 ENCOUNTER — Encounter (HOSPITAL_COMMUNITY)
Admission: RE | Admit: 2015-06-29 | Discharge: 2015-06-29 | Disposition: A | Discharge status: Home / Self Care | Payer: 59 | Source: Ambulatory Visit | Attending: Cardiovascular Disease | Admitting: Cardiovascular Disease

## 2015-06-29 ENCOUNTER — Encounter (HOSPITAL_COMMUNITY): Payer: Self-pay

## 2015-06-29 VITALS — BP 140/90 | HR 55 | Ht 65.0 in | Wt 181.0 lb

## 2015-06-29 DIAGNOSIS — I214 Non-ST elevation (NSTEMI) myocardial infarction: Secondary | ICD-10-CM | POA: Diagnosis not present

## 2015-06-29 DIAGNOSIS — I2510 Atherosclerotic heart disease of native coronary artery without angina pectoris: Secondary | ICD-10-CM | POA: Insufficient documentation

## 2015-06-29 NOTE — Patient Instructions (Signed)
Pt has finished orientation and is scheduled to return to CR on July 03, 2015 at 0930. Pt has been instructed to arrive to class 15 minutes early for scheduled class. Pt has been instructed to wear comfortable clothing and shoes with rubber soles. Pt has been told to take their medications 1 hour prior to coming to class.  If the patient is not going to attend class, she has been instructed to call.

## 2015-06-29 NOTE — Progress Notes (Signed)
Cardiac/Pulmonary Rehab Medication Review by a Pharmacist  Does the patient  feel that his/her medications are working for him/her?  yes  Has the patient been experiencing any side effects to the medications prescribed?  Feels like constipation is more from change in diet  Does the patient measure his/her own blood pressure or blood glucose at home?  yes   Does the patient have any problems obtaining medications due to transportation or finances?   no  Understanding of regimen: excellent Understanding of indications: excellent Potential of compliance: excellent  Questions asked to Determine Patient Understanding of Medication Regimen:  1. What is the name of the medication?  2. What is the medication used for?  3. When should it be taken?  4. How much should be taken?  5. How will you take it?  6. What side effects should you report?  Understanding Defined as: Excellent: All questions above are correct Good: Questions 1-4 are correct Fair: Questions 1-2 are correct  Poor: 1 or none of the above questions are correct   Pharmacist comments: Maria Fry has a good understanding of her medications and indications.  She uses a pill box to keep them organized and has a daughter who is a Education administrator and is a good resource for her.    Maria Fry 06/29/2015 2:25 PM

## 2015-06-29 NOTE — Progress Notes (Signed)
Patient arrived for 1st visit/orientation/education at 1415. Patient was referred to CR by Dr. Bronson Ing due to MI (I21.4). During orientation advised patient on arrival and appointment times what to wear, what to do before, during and after exercise. Reviewed attendance and class policy. Talked about inclement weather and class consultation policy. Pt is scheduled to return Cardiac Rehab on August 1,2016 at 0930. Pt was advised to come to class 5 minutes before class starts. She was also given instructions on meeting with the dietician and attending the Family Structure classes. Pt is eager to get started. Patient was able to complete 6 minute walk test. Patient was measured for the equipment. Discussed equipment safety with patient. Took patient pre-anthropometric measurements. Patient finished visit at 1555.

## 2015-07-03 ENCOUNTER — Encounter (HOSPITAL_COMMUNITY)
Admission: RE | Admit: 2015-07-03 | Discharge: 2015-07-03 | Disposition: A | Discharge status: Home / Self Care | Payer: 59 | Source: Ambulatory Visit | Attending: Cardiovascular Disease | Admitting: Cardiovascular Disease

## 2015-07-03 DIAGNOSIS — I2510 Atherosclerotic heart disease of native coronary artery without angina pectoris: Secondary | ICD-10-CM | POA: Diagnosis not present

## 2015-07-03 DIAGNOSIS — I214 Non-ST elevation (NSTEMI) myocardial infarction: Secondary | ICD-10-CM | POA: Insufficient documentation

## 2015-07-05 ENCOUNTER — Encounter (HOSPITAL_COMMUNITY)
Admission: RE | Admit: 2015-07-05 | Discharge: 2015-07-05 | Disposition: A | Discharge status: Home / Self Care | Payer: 59 | Source: Ambulatory Visit | Attending: Cardiovascular Disease | Admitting: Cardiovascular Disease

## 2015-07-05 DIAGNOSIS — I214 Non-ST elevation (NSTEMI) myocardial infarction: Secondary | ICD-10-CM | POA: Diagnosis not present

## 2015-07-07 ENCOUNTER — Encounter (HOSPITAL_COMMUNITY)
Admission: RE | Admit: 2015-07-07 | Discharge: 2015-07-07 | Disposition: A | Discharge status: Home / Self Care | Payer: 59 | Source: Ambulatory Visit | Attending: Cardiovascular Disease | Admitting: Cardiovascular Disease

## 2015-07-07 DIAGNOSIS — I214 Non-ST elevation (NSTEMI) myocardial infarction: Secondary | ICD-10-CM | POA: Diagnosis not present

## 2015-07-07 NOTE — Progress Notes (Signed)
Cardiac Rehabilitation Program Outcomes Report   Orientation:  06/29/15 Graduate Date:  tbd Discharge Date:  tbd # of sessions completed: 3  Cardiologist: Bronson Ing Family MD:  Denny Levy Time:  0930  A.  Exercise Program:  Tolerates exercise @ 2.10 METS for 15 minutes and Walk Test Results:  Pre: 2.00 mets  B.  Mental Health:  Good mental attitude  C.  Education/Instruction/Skills  Accurately checks own pulse.  Rest:  50  Exercise:  82 and Knows THR for exercise  Uses Perceived Exertion Scale and/or Dyspnea Scale  D.  Nutrition/Weight Control/Body Composition:  Adherence to prescribed nutrition program: good    E.  Blood Lipids    Lab Results  Component Value Date   CHOL 145 06/06/2015   HDL 34* 06/06/2015   LDLCALC 101* 06/06/2015   TRIG 49 06/06/2015   CHOLHDL 4.3 06/06/2015    F.  Lifestyle Changes:  Making positive lifestyle changes  G.  Symptoms noted with exercise:  Asymptomatic  Report Completed By:  Stevphen [name] RN   Comments:  This is the patients first week progress note for [facility].

## 2015-07-10 ENCOUNTER — Encounter (HOSPITAL_COMMUNITY)
Admission: RE | Admit: 2015-07-10 | Discharge: 2015-07-10 | Disposition: A | Discharge status: Home / Self Care | Payer: 59 | Source: Ambulatory Visit | Attending: Cardiovascular Disease | Admitting: Cardiovascular Disease

## 2015-07-10 DIAGNOSIS — I214 Non-ST elevation (NSTEMI) myocardial infarction: Secondary | ICD-10-CM | POA: Diagnosis not present

## 2015-07-12 ENCOUNTER — Encounter (HOSPITAL_COMMUNITY)
Admission: RE | Admit: 2015-07-12 | Discharge: 2015-07-12 | Disposition: A | Discharge status: Home / Self Care | Payer: 59 | Source: Ambulatory Visit | Attending: Cardiovascular Disease | Admitting: Cardiovascular Disease

## 2015-07-12 DIAGNOSIS — I214 Non-ST elevation (NSTEMI) myocardial infarction: Secondary | ICD-10-CM | POA: Diagnosis not present

## 2015-07-13 NOTE — Progress Notes (Signed)
Patient was given individual home exercise plan. Handout was reviewed and discussed. Patient verbalized an understanding. 

## 2015-07-14 ENCOUNTER — Encounter (HOSPITAL_COMMUNITY)
Admission: RE | Admit: 2015-07-14 | Discharge: 2015-07-14 | Disposition: A | Discharge status: Home / Self Care | Payer: 59 | Source: Ambulatory Visit | Attending: Cardiovascular Disease | Admitting: Cardiovascular Disease

## 2015-07-14 DIAGNOSIS — I214 Non-ST elevation (NSTEMI) myocardial infarction: Secondary | ICD-10-CM | POA: Diagnosis not present

## 2015-07-17 ENCOUNTER — Encounter (HOSPITAL_COMMUNITY)
Admission: RE | Admit: 2015-07-17 | Discharge: 2015-07-17 | Disposition: A | Discharge status: Home / Self Care | Payer: 59 | Source: Ambulatory Visit | Attending: Cardiovascular Disease | Admitting: Cardiovascular Disease

## 2015-07-17 DIAGNOSIS — I214 Non-ST elevation (NSTEMI) myocardial infarction: Secondary | ICD-10-CM | POA: Diagnosis not present

## 2015-07-19 ENCOUNTER — Encounter (HOSPITAL_COMMUNITY)
Admission: RE | Admit: 2015-07-19 | Discharge: 2015-07-19 | Disposition: A | Discharge status: Home / Self Care | Payer: 59 | Source: Ambulatory Visit | Attending: Cardiovascular Disease | Admitting: Cardiovascular Disease

## 2015-07-19 DIAGNOSIS — I214 Non-ST elevation (NSTEMI) myocardial infarction: Secondary | ICD-10-CM | POA: Diagnosis not present

## 2015-07-21 ENCOUNTER — Encounter (HOSPITAL_COMMUNITY)
Admission: RE | Admit: 2015-07-21 | Discharge: 2015-07-21 | Disposition: A | Discharge status: Home / Self Care | Payer: 59 | Source: Ambulatory Visit | Attending: Cardiovascular Disease | Admitting: Cardiovascular Disease

## 2015-07-21 DIAGNOSIS — I214 Non-ST elevation (NSTEMI) myocardial infarction: Secondary | ICD-10-CM | POA: Diagnosis not present

## 2015-07-24 ENCOUNTER — Encounter (HOSPITAL_COMMUNITY)
Admission: RE | Admit: 2015-07-24 | Discharge: 2015-07-24 | Disposition: A | Discharge status: Home / Self Care | Payer: 59 | Source: Ambulatory Visit | Attending: Cardiovascular Disease | Admitting: Cardiovascular Disease

## 2015-07-24 DIAGNOSIS — I214 Non-ST elevation (NSTEMI) myocardial infarction: Secondary | ICD-10-CM | POA: Diagnosis not present

## 2015-07-26 ENCOUNTER — Encounter (HOSPITAL_COMMUNITY)
Admission: RE | Admit: 2015-07-26 | Discharge: 2015-07-26 | Disposition: A | Discharge status: Home / Self Care | Payer: 59 | Source: Ambulatory Visit | Attending: Cardiovascular Disease | Admitting: Cardiovascular Disease

## 2015-07-26 DIAGNOSIS — I214 Non-ST elevation (NSTEMI) myocardial infarction: Secondary | ICD-10-CM | POA: Diagnosis not present

## 2015-07-27 ENCOUNTER — Other Ambulatory Visit: Payer: Self-pay | Admitting: "Endocrinology

## 2015-07-27 DIAGNOSIS — E0590 Thyrotoxicosis, unspecified without thyrotoxic crisis or storm: Secondary | ICD-10-CM

## 2015-07-28 ENCOUNTER — Encounter (HOSPITAL_COMMUNITY): Payer: 59

## 2015-07-31 ENCOUNTER — Encounter (HOSPITAL_COMMUNITY)
Admission: RE | Admit: 2015-07-31 | Discharge: 2015-07-31 | Disposition: A | Discharge status: Home / Self Care | Payer: 59 | Source: Ambulatory Visit | Attending: Cardiovascular Disease | Admitting: Cardiovascular Disease

## 2015-07-31 DIAGNOSIS — I214 Non-ST elevation (NSTEMI) myocardial infarction: Secondary | ICD-10-CM | POA: Diagnosis not present

## 2015-08-02 ENCOUNTER — Encounter (HOSPITAL_COMMUNITY)
Admission: RE | Admit: 2015-08-02 | Discharge: 2015-08-02 | Disposition: A | Discharge status: Home / Self Care | Payer: 59 | Source: Ambulatory Visit | Attending: Cardiovascular Disease | Admitting: Cardiovascular Disease

## 2015-08-02 ENCOUNTER — Encounter (HOSPITAL_COMMUNITY): Payer: Self-pay

## 2015-08-02 ENCOUNTER — Encounter (HOSPITAL_COMMUNITY)
Admission: RE | Admit: 2015-08-02 | Discharge: 2015-08-02 | Disposition: A | Discharge status: Home / Self Care | Payer: Medicare Other | Source: Ambulatory Visit | Attending: "Endocrinology | Admitting: "Endocrinology

## 2015-08-02 DIAGNOSIS — I214 Non-ST elevation (NSTEMI) myocardial infarction: Secondary | ICD-10-CM | POA: Diagnosis not present

## 2015-08-02 DIAGNOSIS — E0590 Thyrotoxicosis, unspecified without thyrotoxic crisis or storm: Secondary | ICD-10-CM | POA: Insufficient documentation

## 2015-08-02 MED ORDER — SODIUM IODIDE I 131 CAPSULE
13.0000 | Freq: Once | INTRAVENOUS | Status: AC | PRN
  Administered 2015-08-02: 13 via ORAL

## 2015-08-03 ENCOUNTER — Encounter (HOSPITAL_COMMUNITY)
Admission: RE | Admit: 2015-08-03 | Discharge: 2015-08-03 | Disposition: A | Discharge status: Home / Self Care | Payer: Medicare Other | Source: Ambulatory Visit | Attending: "Endocrinology | Admitting: "Endocrinology

## 2015-08-03 DIAGNOSIS — R946 Abnormal results of thyroid function studies: Secondary | ICD-10-CM | POA: Diagnosis not present

## 2015-08-03 DIAGNOSIS — E0590 Thyrotoxicosis, unspecified without thyrotoxic crisis or storm: Secondary | ICD-10-CM | POA: Diagnosis not present

## 2015-08-03 MED ORDER — SODIUM PERTECHNETATE TC 99M INJECTION
10.0000 | Freq: Once | INTRAVENOUS | Status: AC | PRN
  Administered 2015-08-03: 10.2 via INTRAVENOUS

## 2015-08-03 MED ORDER — SODIUM CHLORIDE 0.9 % IJ SOLN
INTRAMUSCULAR | Status: AC
  Filled 2015-08-03: qty 42

## 2015-08-04 ENCOUNTER — Encounter (HOSPITAL_COMMUNITY)
Admission: RE | Admit: 2015-08-04 | Discharge: 2015-08-04 | Disposition: A | Discharge status: Home / Self Care | Payer: Medicare Other | Source: Ambulatory Visit | Attending: Cardiovascular Disease | Admitting: Cardiovascular Disease

## 2015-08-04 DIAGNOSIS — I2510 Atherosclerotic heart disease of native coronary artery without angina pectoris: Secondary | ICD-10-CM | POA: Diagnosis not present

## 2015-08-04 DIAGNOSIS — I214 Non-ST elevation (NSTEMI) myocardial infarction: Secondary | ICD-10-CM | POA: Insufficient documentation

## 2015-08-07 ENCOUNTER — Other Ambulatory Visit: Payer: Self-pay

## 2015-08-07 ENCOUNTER — Emergency Department (HOSPITAL_COMMUNITY): Payer: Medicare Other

## 2015-08-07 ENCOUNTER — Encounter (HOSPITAL_COMMUNITY): Payer: Medicare Other

## 2015-08-07 ENCOUNTER — Encounter (HOSPITAL_COMMUNITY): Payer: Self-pay | Admitting: Emergency Medicine

## 2015-08-07 ENCOUNTER — Emergency Department (HOSPITAL_COMMUNITY)
Admission: EM | Admit: 2015-08-07 | Discharge: 2015-08-07 | Disposition: A | Discharge status: Home / Self Care | Payer: Medicare Other | Attending: Emergency Medicine | Admitting: Emergency Medicine

## 2015-08-07 DIAGNOSIS — I10 Essential (primary) hypertension: Secondary | ICD-10-CM | POA: Insufficient documentation

## 2015-08-07 DIAGNOSIS — I2510 Atherosclerotic heart disease of native coronary artery without angina pectoris: Secondary | ICD-10-CM | POA: Insufficient documentation

## 2015-08-07 DIAGNOSIS — Z79899 Other long term (current) drug therapy: Secondary | ICD-10-CM | POA: Insufficient documentation

## 2015-08-07 DIAGNOSIS — R51 Headache: Secondary | ICD-10-CM | POA: Diagnosis not present

## 2015-08-07 DIAGNOSIS — Z7902 Long term (current) use of antithrombotics/antiplatelets: Secondary | ICD-10-CM | POA: Diagnosis not present

## 2015-08-07 DIAGNOSIS — Z7982 Long term (current) use of aspirin: Secondary | ICD-10-CM | POA: Diagnosis not present

## 2015-08-07 DIAGNOSIS — E079 Disorder of thyroid, unspecified: Secondary | ICD-10-CM | POA: Insufficient documentation

## 2015-08-07 DIAGNOSIS — R519 Headache, unspecified: Secondary | ICD-10-CM

## 2015-08-07 DIAGNOSIS — R110 Nausea: Secondary | ICD-10-CM | POA: Diagnosis not present

## 2015-08-07 LAB — BASIC METABOLIC PANEL WITH GFR
Anion gap: 6 (ref 5–15)
BUN: 13 mg/dL (ref 6–20)
CO2: 26 mmol/L (ref 22–32)
Calcium: 8.7 mg/dL — ABNORMAL LOW (ref 8.9–10.3)
Chloride: 106 mmol/L (ref 101–111)
Creatinine, Ser: 0.74 mg/dL (ref 0.44–1.00)
GFR calc Af Amer: >60 mL/min
GFR calc non Af Amer: >60 mL/min
Glucose, Bld: 105 mg/dL — ABNORMAL HIGH (ref 65–99)
Potassium: 3.5 mmol/L (ref 3.5–5.1)
Sodium: 138 mmol/L (ref 135–145)

## 2015-08-07 LAB — CBC WITH DIFFERENTIAL/PLATELET
Basophils Absolute: 0 10*3/uL (ref 0.0–0.1)
Basophils Relative: 0 % (ref 0–1)
Eosinophils Absolute: 0.1 10*3/uL (ref 0.0–0.7)
Eosinophils Relative: 1 % (ref 0–5)
HCT: 36.2 % (ref 36.0–46.0)
Hemoglobin: 12.3 g/dL (ref 12.0–15.0)
Lymphocytes Relative: 29 % (ref 12–46)
Lymphs Abs: 1.9 10*3/uL (ref 0.7–4.0)
MCH: 29.5 pg (ref 26.0–34.0)
MCHC: 34 g/dL (ref 30.0–36.0)
MCV: 86.8 fL (ref 78.0–100.0)
Monocytes Absolute: 0.4 10*3/uL (ref 0.1–1.0)
Monocytes Relative: 6 % (ref 3–12)
Neutro Abs: 4.2 10*3/uL (ref 1.7–7.7)
Neutrophils Relative %: 64 % (ref 43–77)
Platelets: 250 10*3/uL (ref 150–400)
RBC: 4.17 MIL/uL (ref 3.87–5.11)
RDW: 15.3 % (ref 11.5–15.5)
WBC: 6.6 10*3/uL (ref 4.0–10.5)

## 2015-08-07 LAB — TROPONIN I
Troponin I: <0.03 ng/mL (ref <0.031)
Troponin I: <0.03 ng/mL (ref <0.031)

## 2015-08-07 MED ORDER — MORPHINE SULFATE (PF) 4 MG/ML IV SOLN
4.0000 mg | Freq: Once | INTRAVENOUS | Status: AC
  Administered 2015-08-07: 4 mg via INTRAVENOUS
  Filled 2015-08-07: qty 1

## 2015-08-07 MED ORDER — SODIUM CHLORIDE 0.9 % IV BOLUS (SEPSIS)
500.0000 mL | Freq: Once | INTRAVENOUS | Status: AC
  Administered 2015-08-07: 500 mL via INTRAVENOUS

## 2015-08-07 MED ORDER — ONDANSETRON HCL 4 MG/2ML IJ SOLN
4.0000 mg | Freq: Once | INTRAMUSCULAR | Status: AC
  Administered 2015-08-07: 4 mg via INTRAVENOUS
  Filled 2015-08-07: qty 2

## 2015-08-07 NOTE — ED Notes (Signed)
Pt. C/o left sided chest pressure under left breast. PA notified.

## 2015-08-07 NOTE — ED Notes (Signed)
Headache since yesterday.  BP last night 187/103 and this am 177/83 at home.  Rates headache 10/10.  History of MI 06/05/15.

## 2015-08-07 NOTE — Discharge Instructions (Signed)

## 2015-08-07 NOTE — ED Provider Notes (Signed)
CSN: [id number]     Arrival date & time 08/07/15  0831 History   First MD Initiated Contact with Patient 08/07/15 [phone]     Chief Complaint  Patient presents with  . Headache  . Hypertension     (Consider location/radiation/quality/duration/timing/severity/associated sxs/prior Treatment) HPI  Maria Fry is a 64 y.o. female with Hx of CAD and cardiac cath 07/16 who presents to the Emergency Department complaining of headache and high blood pressure.  She states the headache was gradual in onset abd began yesterday.  She describes the pain as throbbing to top of her head.  She also notes that her blood pressure has been high since headache onset.  She denies any missed medication doses.  Headache has been gradually worsening and she also notes mild nausea.  She has tried OTC medication without relief.  She denies fever, neck pain or stiffness, chest pain and shortness of breath, dizziness, numbness or weakness of the face or extremities.  No previous CVA.  Past Medical History  Diagnosis Date  . Hypertension   . Hip pain   . Thyroid disease   . CAD (coronary artery disease)     a. cath 06/07/15 Dist LAD 100%, mid LAD 10%.   Past Surgical History  Procedure Laterality Date  . Tubal ligation    . Abdominal hysterectomy    . Cholecystectomy    . Shoulder arthroscopy    . Heel spurs    . Cardiac catheterization N/A 06/06/2015    Procedure: Left Heart Cath and Coronary Angiography;  Surgeon: Peter M Martinique, MD;  Location: [facility];  Service: Cardiovascular;  Laterality: N/A;   Family History  Problem Relation Age of Onset  . Heart failure Father     Deceased  . Stroke Sister     Deceased  . Heart failure Brother     Deceased  . Cancer Brother     Deceased  . Heart attack Father     Deceased  . Heart attack Brother     Deceased   Social History  Substance Use Topics  . Smoking status: Never Smoker   . Smokeless tobacco: Never Used  . Alcohol Use: No   OB History     Gravida Para Term Preterm AB TAB SAB Ectopic Multiple Living   3 3 3             Review of Systems  Constitutional: Negative for fever, activity change and appetite change.  HENT: Negative for facial swelling and trouble swallowing.   Eyes: Negative for photophobia, pain and visual disturbance.  Respiratory: Negative for shortness of breath.   Cardiovascular: Negative for chest pain.  Gastrointestinal: Positive for nausea. Negative for vomiting.  Musculoskeletal: Negative for neck pain and neck stiffness.  Skin: Negative for rash and wound.  Neurological: Positive for headaches. Negative for dizziness, facial asymmetry, speech difficulty, weakness and numbness.  Psychiatric/Behavioral: Negative for confusion and decreased concentration.  All other systems reviewed and are negative.     Allergies  Demerol; Lisinopril; and Lodine  Home Medications   Prior to Admission medications   Medication Sig Start Date End Date Taking? Authorizing Provider  aspirin 81 MG chewable tablet Chew 1 tablet (81 mg total) by mouth daily. 06/08/15  Yes Bhavinkumar Bhagat, PA  atorvastatin (LIPITOR) 80 MG tablet Take 1 tablet (80 mg total) by mouth daily at 6 PM. 06/07/15  Yes Bhavinkumar Bhagat, PA  clopidogrel (PLAVIX) 75 MG tablet Take 1 tablet (75 mg total) by  mouth daily. 06/08/15  Yes Bhavinkumar Bhagat, PA  gabapentin (NEURONTIN) 300 MG capsule Take 300 mg by mouth daily.   Yes Historical Provider, MD  metoprolol tartrate (LOPRESSOR) 25 MG tablet Take 1 1/2 (total 37.5mg ) table by mouth in morning and evening. 06/07/15  Yes Bhavinkumar Bhagat, PA  methimazole (TAPAZOLE) 5 MG tablet Take 1 tablet (5 mg total) by mouth 2 (two) times daily. Patient not taking: Reported on 08/07/2015 06/07/15   Leanor Kail, PA  nitroGLYCERIN (NITROSTAT) 0.4 MG SL tablet Place 1 tablet (0.4 mg total) under the tongue every 5 (five) minutes as needed for chest pain. 06/07/15   Bhavinkumar Bhagat, PA   BP 134/61 mmHg  Pulse  56  Temp(Src) 97.8 F (36.6 C)  Resp 16  Ht 5\' 5"  (1.651 m)  Wt 181 lb (82.101 kg)  BMI 30.12 kg/m2  SpO2 98% Physical Exam  Constitutional: She is oriented to person, place, and time. She appears well-developed and well-nourished. No distress.  HENT:  Head: Normocephalic and atraumatic.  Mouth/Throat: Oropharynx is clear and moist.  Eyes: EOM are normal. Pupils are equal, round, and reactive to light.  Neck: Normal range of motion and phonation normal. Neck supple. No spinous process tenderness and no muscular tenderness present. No rigidity. No Brudzinski's sign and no Kernig's sign noted.  Cardiovascular: Normal rate, regular rhythm, normal heart sounds and intact distal pulses.   No murmur heard. Pulmonary/Chest: Effort normal and breath sounds normal. No respiratory distress.  Abdominal: Soft. She exhibits no distension. There is no tenderness. There is no rebound.  Musculoskeletal: Normal range of motion. She exhibits no edema or tenderness.  Neurological: She is alert and oriented to person, place, and time. She has normal strength. No cranial nerve deficit or sensory deficit. She exhibits normal muscle tone. Coordination and gait normal. GCS eye subscore is 4. GCS verbal subscore is 5. GCS motor subscore is 6.  Reflex Scores:      Tricep reflexes are 2+ on the right side and 2+ on the left side.      Bicep reflexes are 2+ on the right side and 2+ on the left side. Skin: Skin is warm and dry.  Psychiatric: She has a normal mood and affect.  Nursing note and vitals reviewed.   ED Course  Procedures (including critical care time) Labs Review Labs Reviewed  BASIC METABOLIC PANEL - Abnormal; Notable for the following:    Glucose, Bld 105 (*)    Calcium 8.7 (*)    All other components within normal limits  CBC WITH DIFFERENTIAL/PLATELET  TROPONIN I    Imaging Review Ct Head Wo Contrast  08/07/2015   CLINICAL DATA:  64 year old female with headache  EXAM: CT HEAD WITHOUT  CONTRAST  TECHNIQUE: Contiguous axial images were obtained from the base of the skull through the vertex without intravenous contrast.  COMPARISON:  Prior head CT 01/28/2014  FINDINGS: Negative for acute intracranial hemorrhage, acute infarction, mass, mass effect, hydrocephalus or midline shift. Gray-white differentiation is preserved throughout. No acute soft tissue or calvarial abnormality. The globes and orbits are symmetric and unremarkable. Normal aeration of the mastoid air cells and visualized paranasal sinuses.  IMPRESSION: Negative head CT.   Electronically Signed   By: Jacqulynn Cadet M.D.   On: 08/07/2015 09:34   I have personally reviewed and evaluated these images and lab results as part of my medical decision-making.   EKG Interpretation   Date/Time:  Monday August 07 2015 09:01:04 EDT Ventricular Rate:  22  PR Interval:  171 QRS Duration: 102 QT Interval:  434 QTC Calculation: 444 R Axis:   35 Text Interpretation:  Sinus rhythm Nonspecific T abnormalities, diffuse  leads No significant change since last tracing Confirmed by FLOYD MD,  DANIEL ([id number]) on 08/07/2015 9:28:58 AM      MDM   Final diagnoses:  Headache disorder   After the initial evaluation, pt complained to nursing that she was having a sharp pain under her left breast.  Nursing ordered repeat EKG and serial troponin  were ordered.  On re-eval, pt states chest pain was brief, only lasting "few minutes" then spontaneously resolved.     1110  Patient is feeling better. headache has resolved and she states she is ready for d/c.  She agrees to continue current meds, and close PMD f/u in 1-2 days.  Also advised to return for any worsening sx's.      Kem Parkinson, PA-C 08/09/15 2155  Deno Etienne, DO 08/11/15 1040

## 2015-08-09 ENCOUNTER — Encounter (HOSPITAL_COMMUNITY)
Admission: RE | Admit: 2015-08-09 | Discharge: 2015-08-09 | Disposition: A | Discharge status: Home / Self Care | Payer: Medicare Other | Source: Ambulatory Visit | Attending: Cardiovascular Disease | Admitting: Cardiovascular Disease

## 2015-08-09 DIAGNOSIS — I2510 Atherosclerotic heart disease of native coronary artery without angina pectoris: Secondary | ICD-10-CM | POA: Diagnosis not present

## 2015-08-09 DIAGNOSIS — I214 Non-ST elevation (NSTEMI) myocardial infarction: Secondary | ICD-10-CM | POA: Diagnosis not present

## 2015-08-11 ENCOUNTER — Other Ambulatory Visit: Payer: Self-pay | Admitting: "Endocrinology

## 2015-08-11 ENCOUNTER — Encounter (HOSPITAL_COMMUNITY)
Admission: RE | Admit: 2015-08-11 | Discharge: 2015-08-11 | Disposition: A | Discharge status: Home / Self Care | Payer: Medicare Other | Source: Ambulatory Visit | Attending: Cardiovascular Disease | Admitting: Cardiovascular Disease

## 2015-08-11 DIAGNOSIS — E0590 Thyrotoxicosis, unspecified without thyrotoxic crisis or storm: Secondary | ICD-10-CM

## 2015-08-11 DIAGNOSIS — I2510 Atherosclerotic heart disease of native coronary artery without angina pectoris: Secondary | ICD-10-CM | POA: Diagnosis not present

## 2015-08-11 DIAGNOSIS — I214 Non-ST elevation (NSTEMI) myocardial infarction: Secondary | ICD-10-CM | POA: Diagnosis not present

## 2015-08-14 ENCOUNTER — Encounter (HOSPITAL_COMMUNITY)
Admission: RE | Admit: 2015-08-14 | Discharge: 2015-08-14 | Disposition: A | Discharge status: Home / Self Care | Payer: Medicare Other | Source: Ambulatory Visit | Attending: Cardiovascular Disease | Admitting: Cardiovascular Disease

## 2015-08-14 DIAGNOSIS — I214 Non-ST elevation (NSTEMI) myocardial infarction: Secondary | ICD-10-CM | POA: Diagnosis not present

## 2015-08-14 DIAGNOSIS — I2510 Atherosclerotic heart disease of native coronary artery without angina pectoris: Secondary | ICD-10-CM | POA: Diagnosis not present

## 2015-08-14 NOTE — Progress Notes (Signed)
Cardiac Rehabilitation Program Outcomes Report   Orientation:  06/29/15 Graduate Date:  tbd Discharge Date:  tbd # of sessions completed: 18  Cardiologist: Bronson Ing Family MD:  Denny Levy Time:  0930  A.  Exercise Program:  Tolerates exercise @ 2.80 METS for 15 minutes  B.  Mental Health:  Good mental attitude  C.  Education/Instruction/Skills  Accurately checks own pulse.  Rest:  58  Exercise:  80 and Knows THR for exercise  Uses Perceived Exertion Scale and/or Dyspnea Scale  D.  Nutrition/Weight Control/Body Composition:  Adherence to prescribed nutrition program: fair    E.  Blood Lipids    Lab Results  Component Value Date   CHOL 145 06/06/2015   HDL 34* 06/06/2015   LDLCALC 101* 06/06/2015   TRIG 49 06/06/2015   CHOLHDL 4.3 06/06/2015    F.  Lifestyle Changes:  Making positive lifestyle changes  G.  Symptoms noted with exercise:  Asymptomatic  Report Completed By:  Stevphen [name] RN   Comments:  This is patients halfway progress note for [facility].  Patient is doing well in program.

## 2015-08-16 ENCOUNTER — Encounter (HOSPITAL_COMMUNITY)
Admission: RE | Admit: 2015-08-16 | Discharge: 2015-08-16 | Disposition: A | Discharge status: Home / Self Care | Payer: Medicare Other | Source: Ambulatory Visit | Attending: Cardiovascular Disease | Admitting: Cardiovascular Disease

## 2015-08-16 DIAGNOSIS — I214 Non-ST elevation (NSTEMI) myocardial infarction: Secondary | ICD-10-CM | POA: Diagnosis not present

## 2015-08-16 DIAGNOSIS — I2510 Atherosclerotic heart disease of native coronary artery without angina pectoris: Secondary | ICD-10-CM | POA: Diagnosis not present

## 2015-08-18 ENCOUNTER — Encounter (HOSPITAL_COMMUNITY): Payer: Medicare Other

## 2015-08-18 ENCOUNTER — Encounter (HOSPITAL_COMMUNITY)
Admission: RE | Admit: 2015-08-18 | Discharge: 2015-08-18 | Disposition: A | Discharge status: Home / Self Care | Payer: Medicare Other | Source: Ambulatory Visit | Attending: "Endocrinology | Admitting: "Endocrinology

## 2015-08-18 ENCOUNTER — Encounter (HOSPITAL_COMMUNITY): Payer: Self-pay

## 2015-08-18 DIAGNOSIS — E0590 Thyrotoxicosis, unspecified without thyrotoxic crisis or storm: Secondary | ICD-10-CM | POA: Diagnosis present

## 2015-08-18 DIAGNOSIS — E0520 Thyrotoxicosis with toxic multinodular goiter without thyrotoxic crisis or storm: Secondary | ICD-10-CM | POA: Insufficient documentation

## 2015-08-18 MED ORDER — SODIUM IODIDE I 131 CAPSULE
25.0000 | Freq: Once | INTRAVENOUS | Status: AC | PRN
  Administered 2015-08-18: 25 via ORAL

## 2015-08-21 ENCOUNTER — Encounter (HOSPITAL_COMMUNITY): Payer: Medicare Other

## 2015-08-23 ENCOUNTER — Encounter (HOSPITAL_COMMUNITY)
Admission: RE | Admit: 2015-08-23 | Discharge: 2015-08-23 | Disposition: A | Discharge status: Home / Self Care | Payer: Medicare Other | Source: Ambulatory Visit | Attending: Cardiovascular Disease | Admitting: Cardiovascular Disease

## 2015-08-23 DIAGNOSIS — I2510 Atherosclerotic heart disease of native coronary artery without angina pectoris: Secondary | ICD-10-CM | POA: Diagnosis not present

## 2015-08-23 DIAGNOSIS — I214 Non-ST elevation (NSTEMI) myocardial infarction: Secondary | ICD-10-CM | POA: Diagnosis not present

## 2015-08-25 ENCOUNTER — Encounter (HOSPITAL_COMMUNITY)
Admission: RE | Admit: 2015-08-25 | Discharge: 2015-08-25 | Disposition: A | Discharge status: Home / Self Care | Payer: Medicare Other | Source: Ambulatory Visit | Attending: Cardiovascular Disease | Admitting: Cardiovascular Disease

## 2015-08-25 DIAGNOSIS — I2510 Atherosclerotic heart disease of native coronary artery without angina pectoris: Secondary | ICD-10-CM | POA: Diagnosis not present

## 2015-08-25 DIAGNOSIS — I214 Non-ST elevation (NSTEMI) myocardial infarction: Secondary | ICD-10-CM | POA: Diagnosis not present

## 2015-08-28 ENCOUNTER — Encounter (HOSPITAL_COMMUNITY)
Admission: RE | Admit: 2015-08-28 | Discharge: 2015-08-28 | Disposition: A | Discharge status: Home / Self Care | Payer: Medicare Other | Source: Ambulatory Visit | Attending: Cardiovascular Disease | Admitting: Cardiovascular Disease

## 2015-08-28 DIAGNOSIS — I214 Non-ST elevation (NSTEMI) myocardial infarction: Secondary | ICD-10-CM | POA: Diagnosis not present

## 2015-08-28 DIAGNOSIS — I2510 Atherosclerotic heart disease of native coronary artery without angina pectoris: Secondary | ICD-10-CM | POA: Diagnosis not present

## 2015-08-30 ENCOUNTER — Encounter (HOSPITAL_COMMUNITY)
Admission: RE | Admit: 2015-08-30 | Discharge: 2015-08-30 | Disposition: A | Discharge status: Home / Self Care | Payer: Medicare Other | Source: Ambulatory Visit | Attending: Cardiovascular Disease | Admitting: Cardiovascular Disease

## 2015-08-30 DIAGNOSIS — I2510 Atherosclerotic heart disease of native coronary artery without angina pectoris: Secondary | ICD-10-CM | POA: Diagnosis not present

## 2015-08-30 DIAGNOSIS — I214 Non-ST elevation (NSTEMI) myocardial infarction: Secondary | ICD-10-CM | POA: Diagnosis not present

## 2015-09-01 ENCOUNTER — Encounter (HOSPITAL_COMMUNITY)
Admission: RE | Admit: 2015-09-01 | Discharge: 2015-09-01 | Disposition: A | Discharge status: Home / Self Care | Payer: Medicare Other | Source: Ambulatory Visit | Attending: Cardiovascular Disease | Admitting: Cardiovascular Disease

## 2015-09-01 DIAGNOSIS — I214 Non-ST elevation (NSTEMI) myocardial infarction: Secondary | ICD-10-CM | POA: Diagnosis not present

## 2015-09-01 DIAGNOSIS — I2510 Atherosclerotic heart disease of native coronary artery without angina pectoris: Secondary | ICD-10-CM | POA: Diagnosis not present

## 2015-09-04 ENCOUNTER — Encounter (HOSPITAL_COMMUNITY)
Admission: RE | Admit: 2015-09-04 | Discharge: 2015-09-04 | Disposition: A | Discharge status: Home / Self Care | Payer: Medicare Other | Source: Ambulatory Visit | Attending: Cardiovascular Disease | Admitting: Cardiovascular Disease

## 2015-09-04 DIAGNOSIS — I2510 Atherosclerotic heart disease of native coronary artery without angina pectoris: Secondary | ICD-10-CM | POA: Diagnosis not present

## 2015-09-04 DIAGNOSIS — I214 Non-ST elevation (NSTEMI) myocardial infarction: Secondary | ICD-10-CM | POA: Diagnosis not present

## 2015-09-06 ENCOUNTER — Encounter (HOSPITAL_COMMUNITY)
Admission: RE | Admit: 2015-09-06 | Discharge: 2015-09-06 | Disposition: A | Discharge status: Home / Self Care | Payer: Medicare Other | Source: Ambulatory Visit | Attending: Cardiovascular Disease | Admitting: Cardiovascular Disease

## 2015-09-06 DIAGNOSIS — I214 Non-ST elevation (NSTEMI) myocardial infarction: Secondary | ICD-10-CM | POA: Diagnosis not present

## 2015-09-06 DIAGNOSIS — I2510 Atherosclerotic heart disease of native coronary artery without angina pectoris: Secondary | ICD-10-CM | POA: Diagnosis not present

## 2015-09-08 ENCOUNTER — Encounter (HOSPITAL_COMMUNITY)
Admission: RE | Admit: 2015-09-08 | Discharge: 2015-09-08 | Disposition: A | Discharge status: Home / Self Care | Payer: Medicare Other | Source: Ambulatory Visit | Attending: Cardiovascular Disease | Admitting: Cardiovascular Disease

## 2015-09-08 DIAGNOSIS — I214 Non-ST elevation (NSTEMI) myocardial infarction: Secondary | ICD-10-CM | POA: Diagnosis not present

## 2015-09-08 DIAGNOSIS — I2510 Atherosclerotic heart disease of native coronary artery without angina pectoris: Secondary | ICD-10-CM | POA: Diagnosis not present

## 2015-09-11 ENCOUNTER — Encounter (HOSPITAL_COMMUNITY)
Admission: RE | Admit: 2015-09-11 | Discharge: 2015-09-11 | Disposition: A | Discharge status: Home / Self Care | Payer: Medicare Other | Source: Ambulatory Visit | Attending: Cardiovascular Disease | Admitting: Cardiovascular Disease

## 2015-09-11 DIAGNOSIS — I2510 Atherosclerotic heart disease of native coronary artery without angina pectoris: Secondary | ICD-10-CM | POA: Diagnosis not present

## 2015-09-11 DIAGNOSIS — I214 Non-ST elevation (NSTEMI) myocardial infarction: Secondary | ICD-10-CM | POA: Diagnosis not present

## 2015-09-13 ENCOUNTER — Encounter (HOSPITAL_COMMUNITY)
Admission: RE | Admit: 2015-09-13 | Discharge: 2015-09-13 | Disposition: A | Discharge status: Home / Self Care | Payer: Medicare Other | Source: Ambulatory Visit | Attending: Cardiovascular Disease | Admitting: Cardiovascular Disease

## 2015-09-13 DIAGNOSIS — I214 Non-ST elevation (NSTEMI) myocardial infarction: Secondary | ICD-10-CM | POA: Diagnosis not present

## 2015-09-13 DIAGNOSIS — I2510 Atherosclerotic heart disease of native coronary artery without angina pectoris: Secondary | ICD-10-CM | POA: Diagnosis not present

## 2015-09-15 ENCOUNTER — Encounter (HOSPITAL_COMMUNITY)
Admission: RE | Admit: 2015-09-15 | Discharge: 2015-09-15 | Disposition: A | Discharge status: Home / Self Care | Payer: Medicare Other | Source: Ambulatory Visit | Attending: Cardiovascular Disease | Admitting: Cardiovascular Disease

## 2015-09-15 DIAGNOSIS — I214 Non-ST elevation (NSTEMI) myocardial infarction: Secondary | ICD-10-CM | POA: Diagnosis not present

## 2015-09-15 DIAGNOSIS — I2510 Atherosclerotic heart disease of native coronary artery without angina pectoris: Secondary | ICD-10-CM | POA: Diagnosis not present

## 2015-09-18 ENCOUNTER — Encounter (HOSPITAL_COMMUNITY)
Admission: RE | Admit: 2015-09-18 | Discharge: 2015-09-18 | Disposition: A | Discharge status: Home / Self Care | Payer: Medicare Other | Source: Ambulatory Visit | Attending: Cardiovascular Disease | Admitting: Cardiovascular Disease

## 2015-09-18 DIAGNOSIS — I214 Non-ST elevation (NSTEMI) myocardial infarction: Secondary | ICD-10-CM | POA: Diagnosis not present

## 2015-09-18 DIAGNOSIS — I2510 Atherosclerotic heart disease of native coronary artery without angina pectoris: Secondary | ICD-10-CM | POA: Diagnosis not present

## 2015-09-20 ENCOUNTER — Encounter (HOSPITAL_COMMUNITY)
Admission: RE | Admit: 2015-09-20 | Discharge: 2015-09-20 | Disposition: A | Discharge status: Home / Self Care | Payer: Medicare Other | Source: Ambulatory Visit | Attending: Cardiovascular Disease | Admitting: Cardiovascular Disease

## 2015-09-20 ENCOUNTER — Other Ambulatory Visit: Payer: Self-pay

## 2015-09-20 ENCOUNTER — Other Ambulatory Visit: Payer: Self-pay | Admitting: "Endocrinology

## 2015-09-20 DIAGNOSIS — I2510 Atherosclerotic heart disease of native coronary artery without angina pectoris: Secondary | ICD-10-CM | POA: Diagnosis not present

## 2015-09-20 DIAGNOSIS — E0590 Thyrotoxicosis, unspecified without thyrotoxic crisis or storm: Secondary | ICD-10-CM

## 2015-09-20 DIAGNOSIS — I214 Non-ST elevation (NSTEMI) myocardial infarction: Secondary | ICD-10-CM | POA: Diagnosis not present

## 2015-09-20 LAB — T4, FREE: Free T4: 1.07 ng/dL (ref 0.80–1.80)

## 2015-09-20 LAB — TSH: TSH: 0.085 u[IU]/mL — ABNORMAL LOW (ref 0.350–4.500)

## 2015-09-22 ENCOUNTER — Encounter (HOSPITAL_COMMUNITY)
Admission: RE | Admit: 2015-09-22 | Discharge: 2015-09-22 | Disposition: A | Discharge status: Home / Self Care | Payer: Medicare Other | Source: Ambulatory Visit | Attending: Cardiovascular Disease | Admitting: Cardiovascular Disease

## 2015-09-22 DIAGNOSIS — I2510 Atherosclerotic heart disease of native coronary artery without angina pectoris: Secondary | ICD-10-CM | POA: Diagnosis not present

## 2015-09-22 DIAGNOSIS — I214 Non-ST elevation (NSTEMI) myocardial infarction: Secondary | ICD-10-CM | POA: Diagnosis not present

## 2015-09-25 ENCOUNTER — Encounter (HOSPITAL_COMMUNITY)
Admission: RE | Admit: 2015-09-25 | Discharge: 2015-09-25 | Disposition: A | Discharge status: Home / Self Care | Payer: Medicare Other | Source: Ambulatory Visit | Attending: Cardiovascular Disease | Admitting: Cardiovascular Disease

## 2015-09-25 DIAGNOSIS — I2510 Atherosclerotic heart disease of native coronary artery without angina pectoris: Secondary | ICD-10-CM | POA: Diagnosis not present

## 2015-09-25 DIAGNOSIS — I214 Non-ST elevation (NSTEMI) myocardial infarction: Secondary | ICD-10-CM | POA: Diagnosis not present

## 2015-09-27 ENCOUNTER — Ambulatory Visit (INDEPENDENT_AMBULATORY_CARE_PROVIDER_SITE_OTHER): Payer: 59 | Admitting: "Endocrinology

## 2015-09-27 ENCOUNTER — Encounter (HOSPITAL_COMMUNITY)
Admission: RE | Admit: 2015-09-27 | Discharge: 2015-09-27 | Disposition: A | Discharge status: Home / Self Care | Payer: Medicare Other | Source: Ambulatory Visit | Attending: Cardiovascular Disease | Admitting: Cardiovascular Disease

## 2015-09-27 ENCOUNTER — Encounter: Payer: Self-pay | Admitting: "Endocrinology

## 2015-09-27 VITALS — BP 118/75 | HR 63 | Ht 65.0 in | Wt 177.0 lb

## 2015-09-27 DIAGNOSIS — E0590 Thyrotoxicosis, unspecified without thyrotoxic crisis or storm: Secondary | ICD-10-CM | POA: Diagnosis not present

## 2015-09-27 DIAGNOSIS — I2510 Atherosclerotic heart disease of native coronary artery without angina pectoris: Secondary | ICD-10-CM | POA: Diagnosis not present

## 2015-09-27 DIAGNOSIS — I214 Non-ST elevation (NSTEMI) myocardial infarction: Secondary | ICD-10-CM | POA: Diagnosis not present

## 2015-09-27 NOTE — Progress Notes (Signed)
Subjective:    Patient ID: Maria Fry, female    DOB: [birth year],    Past Medical History  Diagnosis Date  . Hypertension   . Hip pain   . Thyroid disease   . CAD (coronary artery disease)     a. cath 06/07/15 Dist LAD 100%, mid LAD 10%.   Past Surgical History  Procedure Laterality Date  . Tubal ligation    . Abdominal hysterectomy    . Cholecystectomy    . Shoulder arthroscopy    . Heel spurs    . Cardiac catheterization N/A 06/06/2015    Procedure: Left Heart Cath and Coronary Angiography;  Surgeon: Peter M Martinique, MD;  Location: [facility];  Service: Cardiovascular;  Laterality: N/A;   Social History   Social History  . Marital Status: Legally Separated    Spouse Name: N/A  . Number of Children: N/A  . Years of Education: N/A   Social History Main Topics  . Smoking status: Never Smoker   . Smokeless tobacco: Never Used  . Alcohol Use: No  . Drug Use: No  . Sexual Activity: Not Asked   Other Topics Concern  . None   Social History Narrative   Outpatient Encounter Prescriptions as of 09/27/2015  Medication Sig  . aspirin 81 MG chewable tablet Chew 1 tablet (81 mg total) by mouth daily.  Marland Kitchen atorvastatin (LIPITOR) 80 MG tablet Take 1 tablet (80 mg total) by mouth daily at 6 PM.  . clopidogrel (PLAVIX) 75 MG tablet Take 1 tablet (75 mg total) by mouth daily.  Marland Kitchen gabapentin (NEURONTIN) 300 MG capsule Take 300 mg by mouth daily.  . metoprolol tartrate (LOPRESSOR) 25 MG tablet Take 1 1/2 (total 37.5mg ) table by mouth in morning and evening.  . nitroGLYCERIN (NITROSTAT) 0.4 MG SL tablet Place 1 tablet (0.4 mg total) under the tongue every 5 (five) minutes as needed for chest pain.  . methimazole (TAPAZOLE) 5 MG tablet Take 1 tablet (5 mg total) by mouth 2 (two) times daily. (Patient not taking: Reported on 08/07/2015)   No facility-administered encounter medications on file as of 09/27/2015.   ALLERGIES: Allergies  Allergen Reactions  . Demerol [Meperidine]  Nausea And Vomiting  . Lisinopril     Throat swollen  . Lodine [Etodolac] Rash   VACCINATION STATUS:  There is no immunization history on file for this patient.  Thyroid Problem Presents for follow-up (She was given RAI therapy for Graves' disease in the middle of September 2016) visit. Patient reports no cold intolerance, diarrhea, fatigue, heat intolerance or palpitations. The symptoms have been improving. Past treatments include iodine 131. The treatment provided moderate relief.     Review of Systems  Constitutional: Negative for fatigue and unexpected weight change.  HENT: Negative for trouble swallowing and voice change.   Eyes: Negative for visual disturbance.  Respiratory: Negative for cough, shortness of breath and wheezing.   Cardiovascular: Negative for chest pain, palpitations and leg swelling.  Gastrointestinal: Negative for nausea, vomiting and diarrhea.  Endocrine: Negative for cold intolerance, heat intolerance, polydipsia, polyphagia and polyuria.  Musculoskeletal: Negative for myalgias and arthralgias.  Skin: Negative for color change, pallor, rash and wound.  Neurological: Negative for seizures and headaches.  Psychiatric/Behavioral: Negative for suicidal ideas and confusion.    Objective:    BP 118/75 mmHg  Pulse 63  Ht 5\' 5"  (1.651 m)  Wt 177 lb (80.287 kg)  BMI 29.45 kg/m2  SpO2 99%  Wt Readings from  Last 3 Encounters:  09/27/15 177 lb (80.287 kg)  08/07/15 181 lb (82.101 kg)  06/29/15 181 lb (82.101 kg)    Physical Exam  Constitutional: She is oriented to person, place, and time. She appears well-developed.  HENT:  Head: Normocephalic and atraumatic.  Eyes: EOM are normal.  Neck: Normal range of motion. Neck supple. No tracheal deviation present. No thyromegaly present.  Cardiovascular: Normal rate and regular rhythm.   Pulmonary/Chest: Effort normal and breath sounds normal.  Abdominal: Soft. Bowel sounds are normal. There is no tenderness.  There is no guarding.  Musculoskeletal: She exhibits no edema.  Neurological: She is alert and oriented to person, place, and time. She has normal reflexes. No cranial nerve deficit. Coordination normal.  Skin: Skin is warm and dry. No rash noted. No erythema. No pallor.  Psychiatric: She has a normal mood and affect. Judgment normal.    Results for orders placed or performed in visit on 09/20/15  TSH  Result Value Ref Range   TSH 0.085 (L) 0.350 - 4.500 uIU/mL  T4, free  Result Value Ref Range   Free T4 1.07 0.80 - 1.80 ng/dL   Complete Blood Count (Most recent): Lab Results  Component Value Date   WBC 6.6 08/07/2015   HGB 12.3 08/07/2015   HCT 36.2 08/07/2015   MCV 86.8 08/07/2015   PLT 250 08/07/2015   Chemistry (most recent): Lab Results  Component Value Date   NA 138 08/07/2015   K 3.5 08/07/2015   CL 106 08/07/2015   CO2 26 08/07/2015   BUN 13 08/07/2015   CREATININE 0.74 08/07/2015   Diabetic Labs (most recent): Lab Results  Component Value Date   HGBA1C 5.8* 06/06/2015   HGBA1C 5.9* 06/05/2015   Lipid profile (most recent): Lab Results  Component Value Date   TRIG 49 06/06/2015   CHOL 145 06/06/2015         Assessment & Plan:   1. Hyperthyroidism She is status post RAI therapy in September 2016 with clinical signs of improvement. Heart thyroid function tests are within normal limits. She is not on thyroid hormone replacement yet. I will repeat: - T4, free - TSH in 7 weeks with follow-up in 8 weeks. She is on low-dose metoprolol which was started for blood pressure treatment. I advised her to continue.  I advised patient to maintain close follow up with their PCP for primary care needs. Follow up plan: Return in about 8 weeks (around 11/22/2015) for overactive thyroid.  Glade Lloyd, MD Phone: [phone]  Fax: [phone]   09/27/2015, 4:06 PM

## 2015-09-29 ENCOUNTER — Encounter (HOSPITAL_COMMUNITY)
Admission: RE | Admit: 2015-09-29 | Discharge: 2015-09-29 | Disposition: A | Discharge status: Home / Self Care | Payer: Medicare Other | Source: Ambulatory Visit | Attending: Cardiovascular Disease | Admitting: Cardiovascular Disease

## 2015-09-29 DIAGNOSIS — I214 Non-ST elevation (NSTEMI) myocardial infarction: Secondary | ICD-10-CM | POA: Diagnosis not present

## 2015-09-29 DIAGNOSIS — I2510 Atherosclerotic heart disease of native coronary artery without angina pectoris: Secondary | ICD-10-CM | POA: Diagnosis not present

## 2015-10-02 ENCOUNTER — Ambulatory Visit (INDEPENDENT_AMBULATORY_CARE_PROVIDER_SITE_OTHER): Payer: Medicare Other | Admitting: Cardiovascular Disease

## 2015-10-02 ENCOUNTER — Encounter (HOSPITAL_COMMUNITY): Payer: Medicare Other

## 2015-10-02 VITALS — BP 124/76 | HR 71 | Ht 64.0 in | Wt 175.2 lb

## 2015-10-02 DIAGNOSIS — E0590 Thyrotoxicosis, unspecified without thyrotoxic crisis or storm: Secondary | ICD-10-CM

## 2015-10-02 DIAGNOSIS — I25118 Atherosclerotic heart disease of native coronary artery with other forms of angina pectoris: Secondary | ICD-10-CM | POA: Diagnosis not present

## 2015-10-02 DIAGNOSIS — Z23 Encounter for immunization: Secondary | ICD-10-CM | POA: Diagnosis not present

## 2015-10-02 DIAGNOSIS — E784 Other hyperlipidemia: Secondary | ICD-10-CM | POA: Diagnosis not present

## 2015-10-02 DIAGNOSIS — I2510 Atherosclerotic heart disease of native coronary artery without angina pectoris: Secondary | ICD-10-CM

## 2015-10-02 DIAGNOSIS — I252 Old myocardial infarction: Secondary | ICD-10-CM

## 2015-10-02 DIAGNOSIS — E785 Hyperlipidemia, unspecified: Secondary | ICD-10-CM

## 2015-10-02 DIAGNOSIS — I10 Essential (primary) hypertension: Secondary | ICD-10-CM | POA: Diagnosis not present

## 2015-10-02 MED ORDER — RANOLAZINE ER 500 MG PO TB12: 500.0000 mg | ORAL_TABLET | Freq: Two times a day (BID) | ORAL | Status: DC

## 2015-10-02 MED ORDER — NITROGLYCERIN 0.4 MG SL SUBL: 0.4000 mg | SUBLINGUAL_TABLET | SUBLINGUAL | Status: DC | PRN

## 2015-10-02 MED ORDER — METOPROLOL TARTRATE 25 MG PO TABS: ORAL_TABLET | ORAL | Status: DC

## 2015-10-02 MED ORDER — CLOPIDOGREL BISULFATE 75 MG PO TABS: 75.0000 mg | ORAL_TABLET | Freq: Every day | ORAL | Status: DC

## 2015-10-02 MED ORDER — ATORVASTATIN CALCIUM 80 MG PO TABS: 80.0000 mg | ORAL_TABLET | Freq: Every day | ORAL | Status: DC

## 2015-10-02 NOTE — Progress Notes (Signed)
Patient ID: Maria Fry, female   DOB: March 15, 1951, 64 y.o.   MRN: [id number]      SUBJECTIVE: The patient returns for routine cardiovascular follow-up. She appears to have stable exertional angina. She has not taken nitroglycerin but does complain of angina and fatigue after significant exertion during routine activities of daily living. She says she feels no better since her heart attack.  She also complains of lying down at night and leaning on her left side or turning her head to the left and nearly passing out. This appears to be positional. It is unclear to me whether she actually loses consciousness.   Review of Systems: As per "subjective", otherwise negative.  Allergies  Allergen Reactions  . Demerol [Meperidine] Nausea And Vomiting  . Lisinopril     Throat swollen  . Lodine [Etodolac] Rash    Current Outpatient Prescriptions  Medication Sig Dispense Refill  . aspirin 81 MG chewable tablet Chew 1 tablet (81 mg total) by mouth daily.    Marland Kitchen atorvastatin (LIPITOR) 80 MG tablet Take 1 tablet (80 mg total) by mouth daily at 6 PM. 30 tablet 12  . clopidogrel (PLAVIX) 75 MG tablet Take 1 tablet (75 mg total) by mouth daily. 30 tablet 6  . gabapentin (NEURONTIN) 300 MG capsule Take 300 mg by mouth daily.    . metoprolol tartrate (LOPRESSOR) 25 MG tablet Take 1 1/2 (total 37.5mg ) table by mouth in morning and evening. 90 tablet 6  . nitroGLYCERIN (NITROSTAT) 0.4 MG SL tablet Place 1 tablet (0.4 mg total) under the tongue every 5 (five) minutes as needed for chest pain. 25 tablet 12   No current facility-administered medications for this visit.    Past Medical History  Diagnosis Date  . Hypertension   . Hip pain   . Thyroid disease   . CAD (coronary artery disease)     a. cath 06/07/15 Dist LAD 100%, mid LAD 10%.    Past Surgical History  Procedure Laterality Date  . Tubal ligation    . Abdominal hysterectomy    . Cholecystectomy    . Shoulder arthroscopy    . Heel spurs      . Cardiac catheterization N/A 06/06/2015    Procedure: Left Heart Cath and Coronary Angiography;  Surgeon: Peter M Martinique, MD;  Location: [facility];  Service: Cardiovascular;  Laterality: N/A;    Social History   Social History  . Marital Status: Legally Separated    Spouse Name: N/A  . Number of Children: N/A  . Years of Education: N/A   Occupational History  . Not on file.   Social History Main Topics  . Smoking status: Never Smoker   . Smokeless tobacco: Never Used  . Alcohol Use: No  . Drug Use: No  . Sexual Activity: Not on file   Other Topics Concern  . Not on file   Social History Narrative     Filed Vitals:   10/02/15 1306  BP: 124/76  Pulse: 71  Height: 5\' 4"  (1.626 m)  Weight: 175 lb 3.2 oz (79.47 kg)  SpO2: 98%    PHYSICAL EXAM General: NAD HEENT: Normal. Neck: No JVD, no thyromegaly. Lungs: Clear to auscultation bilaterally with normal respiratory effort. CV: Nondisplaced PMI.  Regular rate and rhythm, normal S1/S2, no S3/S4, no murmur. No pretibial or periankle edema.  No carotid bruit.   Abdomen: Soft, nontender, no distention.  Neurologic: Alert and oriented x 3.  Psych: Normal affect. Skin: Normal. Musculoskeletal:  Normal range of motion, no gross deformities. Extremities: No clubbing or cyanosis.   ECG: Most recent ECG reviewed.      ASSESSMENT AND PLAN: 1. CAD with NSTEMI and distal 100% LAD stenosis: Given exertional angina and fatigue, will give one month of Ranexa samples 500 mg bd. Continue aspirin, Plavix, Lipitor, and metoprolol. Allergic to ACEI.   2. Essential HTN: Controlled on metoprolol. No changes.   3. Dyslipidemia: Lipids on 06/06/15 showed total cholesterol 145, triglycerides 49, HDL low at 34, LDL high at 101. Continue high intensity statin therapy with Lipitor 80 mg daily.   4. Hyperthyroidism: I previously made a referral to endocrinology, and she follows now with Dr. [name]. She is s/p RAI therapy in  08/2015.  5. Syncope/near-syncope: Non-cardiac in etiology. Positional. Encouraged to speak with her neurologist.  Dispo: f/u 3 months.  Kate Sable, M.D., F.A.C.C.

## 2015-10-02 NOTE — Patient Instructions (Signed)
Your physician recommends that you schedule a follow-up appointment in: 3 months with Dr Bronson Ing  Take Ranexa 500 mg twice a day.I have given you 1 month of samples   I refilled your medications    If you need a refill on your cardiac medications before your next appointment, please call your pharmacy.     Thank you for choosing [facility] !

## 2015-10-16 NOTE — Progress Notes (Signed)
Patient is discharged from [facility] today, September 29, 2015 with 36 sessions.  She achieved LTG of 30 minutes of aerobic exercise at max met level of 3.56.  All patient vitals are WNL.  Patient has met with dietician.  Discharge instructions have been reviewed in detail and patient expressed an understanding of material given.  Patient plans to exercise at home. Cardiac Rehab will make 1 month, 6 month and 1 year call backs.  Patient had no complaints of any abnormal S/S or pain on their exit visit.  Patient finished post walk test.

## 2015-10-16 NOTE — Progress Notes (Signed)
Cardiac Rehabilitation Program Outcomes Report   Orientation:  06/29/15 Graduate Date:  09/29/15 Discharge Date:  09/29/15 # of sessions completed: 36  Cardiologist: Bronson Ing Family MD:  Denny Levy Time:  0930  A.  Exercise Program:  Tolerates exercise @ 3.56 METS for 15 minutes, Walk Test Results:  Post: 2.82 mets, Improved functional capacity  41 %, Improved  muscular strength  35.3 % and Improved  flexibility 36.32 %  B.  Mental Health:  Good mental attitude, Quality of Life (QOL)  improvements:  Overall  -2.21 %, Health/Functioning -2.52 %, Socioeconomics -1.62 %, Psych/Spiritual 0 %, Family -5.43 %   and PHQ-9: 3. Patient stated she did not think she needed counseling.   C.  Education/Instruction/Skills  Accurately checks own pulse.  Rest:  53  Exercise:  84, Knows TH[facility] for exercise and Attended 13 education classes  Uses Perceived Exertion Scale and/or Dyspnea Scale  D.  Nutrition/Weight Control/Body Composition:  Adherence to prescribed nutrition program: fair  and Patient has lost 2.3 kg   E.  Blood Lipids    Lab Results  Component Value Date   CHOL 145 06/06/2015   HDL 34* 06/06/2015   LDLCALC 101* 06/06/2015   TRIG 49 06/06/2015   CHOLHDL 4.3 06/06/2015    F.  Lifestyle Changes:  Making positive lifestyle changes and Not smoking:  Quit 2001  G.  Symptoms noted with exercise:  Asymptomatic  Report Completed By:  CEdwards RN   Comments:  This is the patients graduation note.  Patient has done well in the program and plans to exercise at home.

## 2015-11-21 ENCOUNTER — Other Ambulatory Visit: Payer: Self-pay | Admitting: "Endocrinology

## 2015-11-22 LAB — T4, FREE: Free T4: 0.76 ng/dL — ABNORMAL LOW (ref 0.80–1.80)

## 2015-11-22 LAB — TSH: TSH: 4.473 u[IU]/mL (ref 0.350–4.500)

## 2015-11-30 ENCOUNTER — Ambulatory Visit (INDEPENDENT_AMBULATORY_CARE_PROVIDER_SITE_OTHER): Payer: 59 | Admitting: "Endocrinology

## 2015-11-30 ENCOUNTER — Encounter: Payer: Self-pay | Admitting: "Endocrinology

## 2015-11-30 VITALS — BP 137/75 | HR 61 | Ht 64.0 in | Wt 179.0 lb

## 2015-11-30 DIAGNOSIS — E032 Hypothyroidism due to medicaments and other exogenous substances: Secondary | ICD-10-CM

## 2015-11-30 DIAGNOSIS — E890 Postprocedural hypothyroidism: Secondary | ICD-10-CM | POA: Insufficient documentation

## 2015-11-30 MED ORDER — LEVOTHYROXINE SODIUM 88 MCG PO TABS: 88.0000 ug | ORAL_TABLET | Freq: Every day | ORAL | Status: DC

## 2015-11-30 NOTE — Progress Notes (Signed)
Subjective:    Patient ID: Maria Fry, female    DOB: [birth year],    Past Medical History  Diagnosis Date  . Hypertension   . Hip pain   . Thyroid disease   . CAD (coronary artery disease)     a. cath 06/07/15 Dist LAD 100%, mid LAD 10%.   Past Surgical History  Procedure Laterality Date  . Tubal ligation    . Abdominal hysterectomy    . Cholecystectomy    . Shoulder arthroscopy    . Heel spurs    . Cardiac catheterization N/A 06/06/2015    Procedure: Left Heart Cath and Coronary Angiography;  Surgeon: Peter M Martinique, MD;  Location: [facility];  Service: Cardiovascular;  Laterality: N/A;   Social History   Social History  . Marital Status: Legally Separated    Spouse Name: N/A  . Number of Children: N/A  . Years of Education: N/A   Social History Main Topics  . Smoking status: Never Smoker   . Smokeless tobacco: Never Used  . Alcohol Use: No  . Drug Use: No  . Sexual Activity: Not Asked   Other Topics Concern  . None   Social History Narrative   Outpatient Encounter Prescriptions as of 11/30/2015  Medication Sig  . aspirin 81 MG chewable tablet Chew 1 tablet (81 mg total) by mouth daily.  Marland Kitchen atorvastatin (LIPITOR) 80 MG tablet Take 1 tablet (80 mg total) by mouth daily at 6 PM.  . clopidogrel (PLAVIX) 75 MG tablet Take 1 tablet (75 mg total) by mouth daily.  Marland Kitchen gabapentin (NEURONTIN) 300 MG capsule Take 300 mg by mouth daily.  . metoprolol tartrate (LOPRESSOR) 25 MG tablet Take 1 1/2 (total 37.5mg ) table by mouth in morning and evening.  . nitroGLYCERIN (NITROSTAT) 0.4 MG SL tablet Place 1 tablet (0.4 mg total) under the tongue every 5 (five) minutes as needed for chest pain.  . ranolazine (RANEXA) 500 MG 12 hr tablet Take 1 tablet (500 mg total) by mouth 2 (two) times daily.  Marland Kitchen levothyroxine (SYNTHROID) 88 MCG tablet Take 1 tablet (88 mcg total) by mouth daily before breakfast.   No facility-administered encounter medications on file as of 11/30/2015.    ALLERGIES: Allergies  Allergen Reactions  . Demerol [Meperidine] Nausea And Vomiting  . Lisinopril     Throat swollen  . Lodine [Etodolac] Rash   VACCINATION STATUS: Immunization History  Administered Date(s) Administered  . Influenza,inj,Quad PF,36+ Mos 10/02/2015    Thyroid Problem Presents for follow-up (She was given RAI therapy for Graves' disease in the middle of September 2016) visit. Symptoms include cold intolerance and fatigue. Patient reports no diarrhea, heat intolerance or palpitations. The symptoms have been stable. Past treatments include iodine 131. The treatment provided moderate relief.     Review of Systems  Constitutional: Positive for fatigue. Negative for unexpected weight change.  HENT: Negative for trouble swallowing and voice change.   Eyes: Negative for visual disturbance.  Respiratory: Negative for cough, shortness of breath and wheezing.   Cardiovascular: Negative for chest pain, palpitations and leg swelling.  Gastrointestinal: Negative for nausea, vomiting and diarrhea.  Endocrine: Positive for cold intolerance. Negative for heat intolerance, polydipsia, polyphagia and polyuria.  Musculoskeletal: Negative for myalgias and arthralgias.  Skin: Negative for color change, pallor, rash and wound.  Neurological: Negative for seizures and headaches.  Psychiatric/Behavioral: Negative for suicidal ideas and confusion.    Objective:    BP 137/75 mmHg  Pulse 61  Ht 5\' 4"  (1.626 m)  Wt 179 lb (81.194 kg)  BMI 30.71 kg/m2  SpO2 98%  Wt Readings from Last 3 Encounters:  11/30/15 179 lb (81.194 kg)  10/02/15 175 lb 3.2 oz (79.47 kg)  09/27/15 177 lb (80.287 kg)    Physical Exam  Constitutional: She is oriented to person, place, and time. She appears well-developed.  HENT:  Head: Normocephalic and atraumatic.  Eyes: EOM are normal.  Neck: Normal range of motion. Neck supple. No tracheal deviation present. No thyromegaly present.  Cardiovascular:  Normal rate and regular rhythm.   Pulmonary/Chest: Effort normal and breath sounds normal.  Abdominal: Soft. Bowel sounds are normal. There is no tenderness. There is no guarding.  Musculoskeletal: She exhibits no edema.  Neurological: She is alert and oriented to person, place, and time. She has normal reflexes. No cranial nerve deficit. Coordination normal.  Skin: Skin is warm and dry. No rash noted. No erythema. No pallor.  Psychiatric: She has a normal mood and affect. Judgment normal.    Results for orders placed or performed in visit on 11/21/15  TSH  Result Value Ref Range   TSH 4.473 0.350 - 4.500 uIU/mL  T4, free  Result Value Ref Range   Free T4 0.76 (L) 0.80 - 1.80 ng/dL     Assessment & Plan:   1.  RAI induced Hypothyroidism:   She is status post RAI therapy in September 2016 with clinical signs of improvement. Her thyroid function tests are consistent with her treatment effect. I will initiate thyroid hormone replacement with levothyroxine 88 g by mouth every morning.  - We discussed about correct intake of levothyroxine, at fasting, with water, separated by at least 30 minutes from breakfast, and separated by more than 4 hours from calcium, iron, multivitamins, acid reflux medications (PPIs). -Patient is made aware of the fact that thyroid hormone replacement is needed for life, dose to be adjusted by periodic monitoring of thyroid function tests.  She is on low-dose metoprolol which was started for blood pressure treatment. I advised her to continue.  I advised patient to maintain close follow up with their PCP for primary care needs. Follow up plan: Return in about 3 months (around 02/28/2016) for underactive thyroid, follow up with pre-visit labs.  Glade Lloyd, MD Phone: [phone]  Fax: [phone]   11/30/2015, 1:47 PM

## 2016-01-02 ENCOUNTER — Encounter: Payer: Self-pay | Admitting: Cardiovascular Disease

## 2016-01-02 ENCOUNTER — Ambulatory Visit (INDEPENDENT_AMBULATORY_CARE_PROVIDER_SITE_OTHER): Payer: PPO | Admitting: Cardiovascular Disease

## 2016-01-02 VITALS — BP 130/80 | HR 60 | Ht 60.0 in | Wt 178.0 lb

## 2016-01-02 DIAGNOSIS — E784 Other hyperlipidemia: Secondary | ICD-10-CM

## 2016-01-02 DIAGNOSIS — I10 Essential (primary) hypertension: Secondary | ICD-10-CM

## 2016-01-02 DIAGNOSIS — E785 Hyperlipidemia, unspecified: Secondary | ICD-10-CM

## 2016-01-02 DIAGNOSIS — I25118 Atherosclerotic heart disease of native coronary artery with other forms of angina pectoris: Secondary | ICD-10-CM

## 2016-01-02 DIAGNOSIS — I252 Old myocardial infarction: Secondary | ICD-10-CM | POA: Diagnosis not present

## 2016-01-02 NOTE — Patient Instructions (Signed)
Medication Instructions:  Your physician recommends that you continue on your current medications as directed. Please refer to the Current Medication list given to you today.  Labwork: NONE  Testing/Procedures: NONE  Follow-Up: Your physician recommends that you schedule a follow-up appointment in: 4 MONTHS WITH DR. KONESWARAN.   Any Other Special Instructions Will Be Listed Below (If Applicable).  If you need a refill on your cardiac medications before your next appointment, please call your pharmacy. 

## 2016-01-02 NOTE — Progress Notes (Signed)
Patient ID: Maria Fry, female   DOB: 1951/08/27, 65 y.o.   MRN: [id number]      SUBJECTIVE: The patient presents for follow up of CAD with exertional angina and fatigue. I provided Ranexa samples at her last visit on 10/02/15. Her pain is well controlled and since finishing Ranexa in late November, she has had one episode of left posterior burning pain.  She is now exercising at the [facility] and participating in the [employer] program.    Review of Systems: As per "subjective", otherwise negative.  Allergies  Allergen Reactions  . Demerol [Meperidine] Nausea And Vomiting  . Lisinopril     Throat swollen  . Lodine [Etodolac] Rash    Current Outpatient Prescriptions  Medication Sig Dispense Refill  . aspirin 81 MG chewable tablet Chew 1 tablet (81 mg total) by mouth daily.    Marland Kitchen atorvastatin (LIPITOR) 80 MG tablet Take 1 tablet (80 mg total) by mouth daily at 6 PM. 30 tablet 12  . clopidogrel (PLAVIX) 75 MG tablet Take 1 tablet (75 mg total) by mouth daily. 30 tablet 6  . gabapentin (NEURONTIN) 300 MG capsule Take 300 mg by mouth daily.    Marland Kitchen levothyroxine (SYNTHROID) 88 MCG tablet Take 1 tablet (88 mcg total) by mouth daily before breakfast. 30 tablet 3  . metoprolol tartrate (LOPRESSOR) 25 MG tablet Take 1 1/2 (total 37.5mg ) table by mouth in morning and evening. 90 tablet 6  . nitroGLYCERIN (NITROSTAT) 0.4 MG SL tablet Place 1 tablet (0.4 mg total) under the tongue every 5 (five) minutes as needed for chest pain. 25 tablet 3  . ranolazine (RANEXA) 500 MG 12 hr tablet Take 1 tablet (500 mg total) by mouth 2 (two) times daily. 60 tablet 3   No current facility-administered medications for this visit.    Past Medical History  Diagnosis Date  . Hypertension   . Hip pain   . Thyroid disease   . CAD (coronary artery disease)     a. cath 06/07/15 Dist LAD 100%, mid LAD 10%.    Past Surgical History  Procedure Laterality Date  . Tubal ligation    . Abdominal hysterectomy    .  Cholecystectomy    . Shoulder arthroscopy    . Heel spurs    . Cardiac catheterization N/A 06/06/2015    Procedure: Left Heart Cath and Coronary Angiography;  Surgeon: Peter M Martinique, MD;  Location: [facility];  Service: Cardiovascular;  Laterality: N/A;    Social History   Social History  . Marital Status: Legally Separated    Spouse Name: N/A  . Number of Children: N/A  . Years of Education: N/A   Occupational History  . Not on file.   Social History Main Topics  . Smoking status: Former Smoker    Start date: 12/02/1977    Quit date: 12/02/2002  . Smokeless tobacco: Never Used  . Alcohol Use: No  . Drug Use: No  . Sexual Activity: Not on file   Other Topics Concern  . Not on file   Social History Narrative     Filed Vitals:   01/02/16 0952  BP: 130/80  Pulse: 60  Height: 5' (1.524 m)  Weight: 178 lb (80.74 kg)  SpO2: 98%    PHYSICAL EXAM General: NAD HEENT: Normal. Neck: No JVD, no thyromegaly. Lungs: Clear to auscultation bilaterally with normal respiratory effort. CV: Nondisplaced PMI. Regular rate and rhythm, normal S1/S2, no S3/S4, no murmur. No pretibial or periankle  edema. No carotid bruit.  Abdomen: Soft, nontender, no distention.  Neurologic: Alert and oriented x 3.  Psych: Normal affect. Skin: Normal. Musculoskeletal: No gross deformities. Extremities: No clubbing or cyanosis.   ECG: Most recent ECG reviewed.      ASSESSMENT AND PLAN: 1. CAD with NSTEMI and distal 100% LAD stenosis: Symptomatically stable. Ranexa was $45 per month and given the absence of symptoms at present, will hold off on prescribing. Continue aspirin, Plavix, Lipitor, and metoprolol. Allergic to ACEI.   2. Essential HTN: Controlled on metoprolol. No changes.   3. Dyslipidemia: Lipids on 06/06/15 showed total cholesterol 145, triglycerides 49, HDL low at 34, LDL high at 101. Continue high intensity statin therapy with Lipitor 80 mg daily.   4.  Hyperthyroidism: Follows with endocrinology (Dr. [name]). She is s/p RAI therapy in 08/2015.  5. Syncope/near-syncope: No recurrences. Non-cardiac in etiology. Positional. Encouraged to speak with her neurologist.  Dispo: f/u 4 months.   Kate Sable, M.D., F.A.C.C.

## 2016-01-17 ENCOUNTER — Encounter (HOSPITAL_COMMUNITY): Payer: Self-pay | Admitting: Emergency Medicine

## 2016-01-17 ENCOUNTER — Emergency Department (HOSPITAL_COMMUNITY): Payer: PPO

## 2016-01-17 ENCOUNTER — Emergency Department (HOSPITAL_COMMUNITY)
Admission: EM | Admit: 2016-01-17 | Discharge: 2016-01-17 | Disposition: A | Discharge status: Home / Self Care | Payer: PPO | Attending: Emergency Medicine | Admitting: Emergency Medicine

## 2016-01-17 DIAGNOSIS — R05 Cough: Secondary | ICD-10-CM | POA: Diagnosis not present

## 2016-01-17 DIAGNOSIS — Z79899 Other long term (current) drug therapy: Secondary | ICD-10-CM | POA: Insufficient documentation

## 2016-01-17 DIAGNOSIS — E079 Disorder of thyroid, unspecified: Secondary | ICD-10-CM | POA: Insufficient documentation

## 2016-01-17 DIAGNOSIS — R197 Diarrhea, unspecified: Secondary | ICD-10-CM | POA: Diagnosis not present

## 2016-01-17 DIAGNOSIS — I10 Essential (primary) hypertension: Secondary | ICD-10-CM | POA: Diagnosis not present

## 2016-01-17 DIAGNOSIS — Z7982 Long term (current) use of aspirin: Secondary | ICD-10-CM | POA: Diagnosis not present

## 2016-01-17 DIAGNOSIS — R51 Headache: Secondary | ICD-10-CM | POA: Diagnosis not present

## 2016-01-17 DIAGNOSIS — M791 Myalgia: Secondary | ICD-10-CM | POA: Insufficient documentation

## 2016-01-17 DIAGNOSIS — I2510 Atherosclerotic heart disease of native coronary artery without angina pectoris: Secondary | ICD-10-CM | POA: Diagnosis not present

## 2016-01-17 DIAGNOSIS — Z87891 Personal history of nicotine dependence: Secondary | ICD-10-CM | POA: Insufficient documentation

## 2016-01-17 DIAGNOSIS — J069 Acute upper respiratory infection, unspecified: Secondary | ICD-10-CM | POA: Diagnosis not present

## 2016-01-17 DIAGNOSIS — Z7902 Long term (current) use of antithrombotics/antiplatelets: Secondary | ICD-10-CM | POA: Diagnosis not present

## 2016-01-17 DIAGNOSIS — J90 Pleural effusion, not elsewhere classified: Secondary | ICD-10-CM | POA: Insufficient documentation

## 2016-01-17 DIAGNOSIS — Z9889 Other specified postprocedural states: Secondary | ICD-10-CM | POA: Diagnosis not present

## 2016-01-17 LAB — COMPREHENSIVE METABOLIC PANEL
ALT: 30 U/L (ref 14–54)
AST: 28 U/L (ref 15–41)
Albumin: 3.6 g/dL (ref 3.5–5.0)
Alkaline Phosphatase: 102 U/L (ref 38–126)
Anion gap: 7 (ref 5–15)
BUN: 9 mg/dL (ref 6–20)
CO2: 27 mmol/L (ref 22–32)
Calcium: 8.2 mg/dL — ABNORMAL LOW (ref 8.9–10.3)
Chloride: 103 mmol/L (ref 101–111)
Creatinine, Ser: 0.75 mg/dL (ref 0.44–1.00)
GFR calc Af Amer: >60 mL/min (ref >60)
GFR calc non Af Amer: >60 mL/min (ref >60)
Glucose, Bld: 119 mg/dL — ABNORMAL HIGH (ref 65–99)
Potassium: 3.3 mmol/L — ABNORMAL LOW (ref 3.5–5.1)
Sodium: 137 mmol/L (ref 135–145)
Total Bilirubin: 0.6 mg/dL (ref 0.3–1.2)
Total Protein: 8 g/dL (ref 6.5–8.1)

## 2016-01-17 LAB — CBC WITH DIFFERENTIAL/PLATELET
Basophils Absolute: 0 10*3/uL (ref 0.0–0.1)
Basophils Relative: 0 %
Eosinophils Absolute: 0.1 10*3/uL (ref 0.0–0.7)
Eosinophils Relative: 1 %
HCT: 34.5 % — ABNORMAL LOW (ref 36.0–46.0)
Hemoglobin: 11.6 g/dL — ABNORMAL LOW (ref 12.0–15.0)
Lymphocytes Relative: 14 %
Lymphs Abs: 1.1 10*3/uL (ref 0.7–4.0)
MCH: 30.1 pg (ref 26.0–34.0)
MCHC: 33.6 g/dL (ref 30.0–36.0)
MCV: 89.4 fL (ref 78.0–100.0)
Monocytes Absolute: 0.3 10*3/uL (ref 0.1–1.0)
Monocytes Relative: 4 %
Neutro Abs: 6.6 10*3/uL (ref 1.7–7.7)
Neutrophils Relative %: 81 %
Platelets: 217 10*3/uL (ref 150–400)
RBC: 3.86 MIL/uL — ABNORMAL LOW (ref 3.87–5.11)
RDW: 14.6 % (ref 11.5–15.5)
WBC: 8.1 10*3/uL (ref 4.0–10.5)

## 2016-01-17 LAB — URINALYSIS, ROUTINE W REFLEX MICROSCOPIC
Bilirubin Urine: NEGATIVE
Glucose, UA: NEGATIVE mg/dL
Ketones, ur: NEGATIVE mg/dL
Leukocytes, UA: NEGATIVE
Nitrite: NEGATIVE
Protein, ur: NEGATIVE mg/dL
Specific Gravity, Urine: 1.02 (ref 1.005–1.030)
pH: 6.5 (ref 5.0–8.0)

## 2016-01-17 LAB — URINE MICROSCOPIC-ADD ON

## 2016-01-17 LAB — TROPONIN I: Troponin I: <0.03 ng/mL (ref <0.031)

## 2016-01-17 MED ORDER — ONDANSETRON HCL 4 MG PO TABS: 4.0000 mg | ORAL_TABLET | Freq: Four times a day (QID) | ORAL | Status: DC

## 2016-01-17 MED ORDER — FUROSEMIDE 20 MG PO TABS: 20.0000 mg | ORAL_TABLET | Freq: Two times a day (BID) | ORAL | Status: DC

## 2016-01-17 MED ORDER — ACETAMINOPHEN 325 MG PO TABS
650.0000 mg | ORAL_TABLET | Freq: Once | ORAL | Status: AC
  Administered 2016-01-17: 650 mg via ORAL
  Filled 2016-01-17: qty 2

## 2016-01-17 MED ORDER — ONDANSETRON 4 MG PO TBDP
4.0000 mg | ORAL_TABLET | Freq: Once | ORAL | Status: AC
  Administered 2016-01-17: 4 mg via ORAL
  Filled 2016-01-17: qty 1

## 2016-01-17 MED ORDER — DOXYCYCLINE HYCLATE 100 MG PO CAPS
100.0000 mg | ORAL_CAPSULE | Freq: Two times a day (BID) | ORAL | Status: DC
Start: 2016-01-17 — End: 2016-03-28

## 2016-01-17 MED ORDER — HYDROCODONE-HOMATROPINE 5-1.5 MG/5ML PO SYRP: 5.0000 mL | ORAL_SOLUTION | Freq: Four times a day (QID) | ORAL | Status: DC | PRN

## 2016-01-17 MED ORDER — POTASSIUM CHLORIDE CRYS ER 20 MEQ PO TBCR
40.0000 meq | EXTENDED_RELEASE_TABLET | Freq: Once | ORAL | Status: AC
  Administered 2016-01-17: 40 meq via ORAL
  Filled 2016-01-17: qty 2

## 2016-01-17 MED ORDER — DOXYCYCLINE HYCLATE 100 MG PO TABS
100.0000 mg | ORAL_TABLET | Freq: Once | ORAL | Status: AC
  Administered 2016-01-17: 100 mg via ORAL
  Filled 2016-01-17: qty 1

## 2016-01-17 MED ORDER — HYDROCOD POLST-CPM POLST ER 10-8 MG/5ML PO SUER
5.0000 mL | Freq: Once | ORAL | Status: AC
  Administered 2016-01-17: 5 mL via ORAL
  Filled 2016-01-17: qty 5

## 2016-01-17 MED ORDER — POTASSIUM CHLORIDE CRYS ER 20 MEQ PO TBCR
20.0000 meq | EXTENDED_RELEASE_TABLET | Freq: Once | ORAL | Status: DC
Start: 2016-01-17 — End: 2016-03-28

## 2016-01-17 MED ORDER — FUROSEMIDE 40 MG PO TABS
40.0000 mg | ORAL_TABLET | Freq: Once | ORAL | Status: AC
  Administered 2016-01-17: 40 mg via ORAL
  Filled 2016-01-17: qty 1

## 2016-01-17 NOTE — ED Provider Notes (Signed)
CSN: [id number]     Arrival date & time 01/17/16  1331 History   First MD Initiated Contact with Patient 01/17/16 2036     Chief Complaint  Patient presents with  . Generalized Body Aches     (Consider location/radiation/quality/duration/timing/severity/associated sxs/prior Treatment) Patient is a 64 y.o. female presenting with URI. The history is provided by the patient.  URI Presenting symptoms: congestion, cough and sore throat   Presenting symptoms comment:  Headaches Severity:  Moderate Onset quality:  Gradual Timing:  Intermittent Progression:  Worsening Chronicity:  New Relieved by:  Nothing Ineffective treatments:  OTC medications Associated symptoms: headaches, sinus pain and sneezing   Associated symptoms comment:  Diarrhea Risk factors: no diabetes mellitus, no immunosuppression, no recent illness and no recent travel     Past Medical History  Diagnosis Date  . Hypertension   . Hip pain   . Thyroid disease   . CAD (coronary artery disease)     a. cath 06/07/15 Dist LAD 100%, mid LAD 10%.   Past Surgical History  Procedure Laterality Date  . Tubal ligation    . Abdominal hysterectomy    . Cholecystectomy    . Shoulder arthroscopy    . Heel spurs    . Cardiac catheterization N/A 06/06/2015    Procedure: Left Heart Cath and Coronary Angiography;  Surgeon: Peter M Martinique, MD;  Location: [facility];  Service: Cardiovascular;  Laterality: N/A;   Family History  Problem Relation Age of Onset  . Heart failure Father     Deceased  . Stroke Sister     Deceased  . Heart failure Brother     Deceased  . Cancer Brother     Deceased  . Heart attack Father     Deceased  . Heart attack Brother     Deceased   Social History  Substance Use Topics  . Smoking status: Former Smoker    Start date: 12/02/1977    Quit date: 12/02/2002  . Smokeless tobacco: Never Used  . Alcohol Use: No   OB History    Gravida Para Term Preterm AB TAB SAB Ectopic Multiple  Living   3 3 3             Review of Systems  Constitutional: Positive for chills.  HENT: Positive for congestion, sneezing and sore throat.   Respiratory: Positive for cough.   Gastrointestinal: Positive for diarrhea.  Neurological: Positive for headaches.  All other systems reviewed and are negative.     Allergies  Demerol; Lisinopril; and Lodine  Home Medications   Prior to Admission medications   Medication Sig Start Date End Date Taking? Authorizing Provider  acetaminophen (TYLENOL) 500 MG tablet Take 500 mg by mouth every 6 (six) hours as needed for mild pain or moderate pain.   Yes Historical Provider, MD  aspirin 81 MG chewable tablet Chew 1 tablet (81 mg total) by mouth daily. 06/08/15  Yes Bhavinkumar Bhagat, PA  atorvastatin (LIPITOR) 80 MG tablet Take 1 tablet (80 mg total) by mouth daily at 6 PM. 10/02/15  Yes [name], MD  clopidogrel (PLAVIX) 75 MG tablet Take 1 tablet (75 mg total) by mouth daily. 10/02/15  Yes [name], MD  gabapentin (NEURONTIN) 300 MG capsule Take 300 mg by mouth at bedtime.    Yes Historical Provider, MD  Homeopathic Products (COLD RELIEF PO) Take 1-2 tablets by mouth daily as needed (for cold symptoms).   Yes Historical Provider, MD  levothyroxine (  SYNTHROID) 88 MCG tablet Take 1 tablet (88 mcg total) by mouth daily before breakfast. 11/30/15  Yes Cassandria Anger, MD  metoprolol tartrate (LOPRESSOR) 25 MG tablet Take 1 1/2 (total 37.5mg ) table by mouth in morning and evening. Patient taking differently: Take 37.5 mg by mouth 2 (two) times daily. Take 1 1/2 (total 37.5mg ) table by mouth in morning and evening. 10/02/15  Yes [name], MD  nitroGLYCERIN (NITROSTAT) 0.4 MG SL tablet Place 1 tablet (0.4 mg total) under the tongue every 5 (five) minutes as needed for chest pain. 10/02/15  Yes [name], MD   BP 134/68 mmHg  Pulse 73  Temp(Src) 98.6 F (37 C) (Oral)  Resp 16  Ht 5' (1.524 m)  Wt 79.379  kg  BMI 34.18 kg/m2  SpO2 99% Physical Exam  Constitutional: She is oriented to person, place, and time. She appears well-developed and well-nourished.  Non-toxic appearance.  HENT:  Head: Normocephalic.  Right Ear: Tympanic membrane and external ear normal.  Left Ear: Tympanic membrane and external ear normal.  nasall congestion present.  Eyes: EOM and lids are normal. Pupils are equal, round, and reactive to light.  Neck: Normal range of motion. Neck supple. Carotid bruit is not present.  Cardiovascular: Normal rate, regular rhythm, normal heart sounds, intact distal pulses and normal pulses.   Pulmonary/Chest: Breath sounds normal. No respiratory distress.  Abdominal: Soft. Bowel sounds are normal. There is no tenderness. There is no guarding.  Musculoskeletal: Normal range of motion.  Lymphadenopathy:       Head (right side): No submandibular adenopathy present.       Head (left side): No submandibular adenopathy present.    She has no cervical adenopathy.  Neurological: She is alert and oriented to person, place, and time. She has normal strength. No cranial nerve deficit or sensory deficit.  Skin: Skin is warm and dry.  Psychiatric: She has a normal mood and affect. Her speech is normal.  Nursing note and vitals reviewed.   ED Course  Procedures (including critical care time) Labs Review Labs Reviewed  CBC WITH DIFFERENTIAL/PLATELET - Abnormal; Notable for the following:    RBC 3.86 (*)    Hemoglobin 11.6 (*)    HCT 34.5 (*)    All other components within normal limits  COMPREHENSIVE METABOLIC PANEL - Abnormal; Notable for the following:    Potassium 3.3 (*)    Glucose, Bld 119 (*)    Calcium 8.2 (*)    All other components within normal limits  URINALYSIS, ROUTINE W REFLEX MICROSCOPIC (NOT AT [facility])    Imaging Review Dg Chest 2 View  01/17/2016  CLINICAL DATA:  Nonproductive cough with weakness, chills and nausea for 3 days. EXAM: CHEST  2 VIEW COMPARISON:   06/05/2015 portable examination. Two view exam 11/04/2014. FINDINGS: There is stable cardiac enlargement. There is new interstitial thickening with increased pulmonary vascularity and a probable small right pleural effusion. No confluent airspace opacity or pneumothorax. The bones appear unchanged. IMPRESSION: Cardiomegaly with new interstitial pulmonary edema and small right pleural effusion. Electronically Signed   By: Richardean Sale M.D.   On: 01/17/2016 14:14   I have personally reviewed and evaluated these images and lab results as part of my medical decision-making.   EKG Interpretation None      MDM No chest pain, jaw pain, or syncope. No sweats, or repeated vomiting. Vital signs reviewed. Pt has had flu shot this season.  CBC and Cmet wnl except potassium 3.3. Marland Kitchen  Chest xray suggested cardiomegaly with new interstitial pulmonary edema and small right pleural effusion. The patient was treated with Tussionex for her cough and congestion, Tylenol for body aches, and Lasix for the pulmonary edema and pleural effusion. The potassium was 3.3 . Patient was given oral potassium in the emergency department.  Patient feeling much better after medications. Patient speaks in complete sentences. Patient will be discharged with medicine for cough and congestion, as well as a short course of Lasix. Patient will see her cardiology specialist for office recheck of the new interstitial pulmonary edema. Pt to return to the ED immediately if any changes or problem.   Final diagnoses:  None    *I have reviewed nursing notes, vital signs, and all appropriate lab and imaging results for this patient.[address] PA-C 01/17/16 2210  Milton Ferguson, MD 01/17/16 [phone]

## 2016-01-17 NOTE — Discharge Instructions (Signed)
Your electrocardiogram in your cardiac enzyme are negative for heart related acute event. Chest x-ray says that there is a small amount of pulmonary edema with pleural effusion on the right side. Your examination suggest an upper respiratory infection. Please see your primary physician and cardiologist concerning this pulmonary edema and pleural effusion symptoms possible. Please increase fluids. Use Tylenol every 4 hours for fever or aching. Use Hycodan for cough and congestion. This medication may cause drowsiness, please use with caution. Please use the potassium daily as well as foods high in potassium, as your potassium was slightly low at 3.3. Use the Lasix 2 times daily over the next 5-7 days, or until you see your heart specialist. Use the other medications as suggested. Please return to the emergency department, or see your specialist or your primary physician if any changes, problems, or concerns. Viral Infections A virus is a type of germ. Viruses can cause:  Minor sore throats.  Aches and pains.  Headaches.  Runny nose.  Rashes.  Watery eyes.  Tiredness.  Coughs.  Loss of appetite.  Feeling sick to your stomach (nausea).  Throwing up (vomiting).  Watery poop (diarrhea). HOME CARE   Only take medicines as told by your doctor.  Drink enough water and fluids to keep your pee (urine) clear or pale yellow. Sports drinks are a good choice.  Get plenty of rest and eat healthy. Soups and broths with crackers or rice are fine. GET HELP RIGHT AWAY IF:   You have a very bad headache.  You have shortness of breath.  You have chest pain or neck pain.  You have an unusual rash.  You cannot stop throwing up.  You have watery poop that does not stop.  You cannot keep fluids down.  You or your child has a temperature by mouth above 102 F (38.9 C), not controlled by medicine.  Your baby is older than 3 months with a rectal temperature of 102 F (38.9 C) or  higher.  Your baby is 91 months old or younger with a rectal temperature of 100.4 F (38 C) or higher. MAKE SURE YOU:   Understand these instructions.  Will watch this condition.  Will get help right away if you are not doing well or get worse.   This information is not intended to replace advice given to you by your health care provider. Make sure you discuss any questions you have with your health care provider.   Document Released: 10/31/2008 Document Revised: 02/10/2012 Document Reviewed: 04/26/2015 Elsevier Interactive Patient Education [employer].

## 2016-01-17 NOTE — ED Notes (Signed)
Pt reports generalized body aches, dry cough, and sore throat x 5 days. Pt also reports nausea and diarrhea that began today. Denies any vomiting.

## 2016-02-05 ENCOUNTER — Other Ambulatory Visit: Payer: Self-pay | Admitting: Neurology

## 2016-02-05 DIAGNOSIS — R42 Dizziness and giddiness: Secondary | ICD-10-CM | POA: Diagnosis not present

## 2016-02-05 DIAGNOSIS — R270 Ataxia, unspecified: Secondary | ICD-10-CM | POA: Diagnosis not present

## 2016-02-05 DIAGNOSIS — R55 Syncope and collapse: Secondary | ICD-10-CM | POA: Diagnosis not present

## 2016-02-05 DIAGNOSIS — E559 Vitamin D deficiency, unspecified: Secondary | ICD-10-CM | POA: Diagnosis not present

## 2016-02-05 DIAGNOSIS — G9001 Carotid sinus syncope: Secondary | ICD-10-CM

## 2016-02-05 DIAGNOSIS — G5710 Meralgia paresthetica, unspecified lower limb: Secondary | ICD-10-CM | POA: Diagnosis not present

## 2016-02-05 DIAGNOSIS — G629 Polyneuropathy, unspecified: Secondary | ICD-10-CM | POA: Diagnosis not present

## 2016-02-05 DIAGNOSIS — M79606 Pain in leg, unspecified: Secondary | ICD-10-CM | POA: Diagnosis not present

## 2016-02-12 ENCOUNTER — Ambulatory Visit (HOSPITAL_COMMUNITY): Payer: PPO

## 2016-02-19 ENCOUNTER — Ambulatory Visit (HOSPITAL_COMMUNITY)
Admission: RE | Admit: 2016-02-19 | Discharge: 2016-02-19 | Disposition: A | Discharge status: Home / Self Care | Payer: PPO | Source: Ambulatory Visit | Attending: Neurology | Admitting: Neurology

## 2016-02-19 DIAGNOSIS — R55 Syncope and collapse: Secondary | ICD-10-CM | POA: Insufficient documentation

## 2016-02-19 DIAGNOSIS — G9001 Carotid sinus syncope: Secondary | ICD-10-CM

## 2016-02-21 ENCOUNTER — Other Ambulatory Visit: Payer: Self-pay | Admitting: "Endocrinology

## 2016-02-21 LAB — T4, FREE: Free T4: 1.4 ng/dL (ref 0.8–1.8)

## 2016-02-21 LAB — TSH: TSH: 1.74 mIU/L

## 2016-02-28 ENCOUNTER — Ambulatory Visit (INDEPENDENT_AMBULATORY_CARE_PROVIDER_SITE_OTHER): Payer: PPO | Admitting: "Endocrinology

## 2016-02-28 ENCOUNTER — Encounter: Payer: Self-pay | Admitting: "Endocrinology

## 2016-02-28 VITALS — BP 122/75 | HR 61 | Ht 60.0 in | Wt 178.0 lb

## 2016-02-28 DIAGNOSIS — E032 Hypothyroidism due to medicaments and other exogenous substances: Secondary | ICD-10-CM | POA: Diagnosis not present

## 2016-02-28 MED ORDER — LEVOTHYROXINE SODIUM 88 MCG PO TABS: 88.0000 ug | ORAL_TABLET | Freq: Every day | ORAL | Status: DC

## 2016-02-28 NOTE — Progress Notes (Signed)
Subjective:    Patient ID: Maria Fry, female    DOB: 10-16-1951,    Past Medical History  Diagnosis Date  . Hypertension   . Hip pain   . Thyroid disease   . CAD (coronary artery disease)     a. cath 06/07/15 Dist LAD 100%, mid LAD 10%.   Past Surgical History  Procedure Laterality Date  . Tubal ligation    . Abdominal hysterectomy    . Cholecystectomy    . Shoulder arthroscopy    . Heel spurs    . Cardiac catheterization N/A 06/06/2015    Procedure: Left Heart Cath and Coronary Angiography;  Surgeon: Peter M Martinique, MD;  Location: [facility];  Service: Cardiovascular;  Laterality: N/A;   Social History   Social History  . Marital Status: Single    Spouse Name: N/A  . Number of Children: N/A  . Years of Education: N/A   Social History Main Topics  . Smoking status: Former Smoker    Start date: 12/02/1977    Quit date: 12/02/2002  . Smokeless tobacco: Never Used  . Alcohol Use: No  . Drug Use: No  . Sexual Activity: Not Asked   Other Topics Concern  . None   Social History Narrative   Outpatient Encounter Prescriptions as of 02/28/2016  Medication Sig  . acetaminophen (TYLENOL) 500 MG tablet Take 500 mg by mouth every 6 (six) hours as needed for mild pain or moderate pain.  Marland Kitchen aspirin 81 MG chewable tablet Chew 1 tablet (81 mg total) by mouth daily.  Marland Kitchen atorvastatin (LIPITOR) 80 MG tablet Take 1 tablet (80 mg total) by mouth daily at 6 PM.  . clopidogrel (PLAVIX) 75 MG tablet Take 1 tablet (75 mg total) by mouth daily.  Marland Kitchen doxycycline (VIBRAMYCIN) 100 MG capsule Take 1 capsule (100 mg total) by mouth 2 (two) times daily.  . furosemide (LASIX) 20 MG tablet Take 1 tablet (20 mg total) by mouth 2 (two) times daily.  Marland Kitchen gabapentin (NEURONTIN) 300 MG capsule Take 300 mg by mouth at bedtime.   . Homeopathic Products (COLD RELIEF PO) Take 1-2 tablets by mouth daily as needed (for cold symptoms).  Marland Kitchen HYDROcodone-homatropine (HYCODAN) 5-1.5 MG/5ML syrup Take 5 mLs by  mouth every 6 (six) hours as needed.  Marland Kitchen levothyroxine (SYNTHROID) 88 MCG tablet Take 1 tablet (88 mcg total) by mouth daily before breakfast.  . metoprolol tartrate (LOPRESSOR) 25 MG tablet Take 1 1/2 (total 37.5mg ) table by mouth in morning and evening. (Patient taking differently: Take 37.5 mg by mouth 2 (two) times daily. Take 1 1/2 (total 37.5mg ) table by mouth in morning and evening.)  . nitroGLYCERIN (NITROSTAT) 0.4 MG SL tablet Place 1 tablet (0.4 mg total) under the tongue every 5 (five) minutes as needed for chest pain.  Marland Kitchen ondansetron (ZOFRAN) 4 MG tablet Take 1 tablet (4 mg total) by mouth every 6 (six) hours.  . potassium chloride SA (K-DUR,KLOR-CON) 20 MEQ tablet Take 1 tablet (20 mEq total) by mouth once.  . [DISCONTINUED] levothyroxine (SYNTHROID) 88 MCG tablet Take 1 tablet (88 mcg total) by mouth daily before breakfast.   No facility-administered encounter medications on file as of 02/28/2016.   ALLERGIES: Allergies  Allergen Reactions  . Demerol [Meperidine] Nausea And Vomiting  . Lisinopril     Throat swollen  . Lodine [Etodolac] Rash   VACCINATION STATUS: Immunization History  Administered Date(s) Administered  . Influenza,inj,Quad PF,36+ Mos 10/02/2015    Thyroid  Problem Presents for follow-up (She was given RAI therapy for Graves' disease in the middle of September 2016) visit. Patient reports no cold intolerance, diarrhea, fatigue, heat intolerance or palpitations. The symptoms have been improving. Past treatments include iodine 131. The treatment provided significant relief. Risk factors include prior iodine 131 therapy.     Review of Systems  Constitutional: Negative for fatigue and unexpected weight change.  HENT: Negative for trouble swallowing and voice change.   Eyes: Negative for visual disturbance.  Respiratory: Negative for cough, shortness of breath and wheezing.   Cardiovascular: Negative for chest pain, palpitations and leg swelling.   Gastrointestinal: Negative for nausea, vomiting and diarrhea.  Endocrine: Negative for cold intolerance, heat intolerance, polydipsia, polyphagia and polyuria.  Musculoskeletal: Negative for myalgias and arthralgias.  Skin: Negative for color change, pallor, rash and wound.  Neurological: Negative for seizures and headaches.  Psychiatric/Behavioral: Negative for suicidal ideas and confusion.    Objective:    BP 122/75 mmHg  Pulse 61  Ht 5' (1.524 m)  Wt 178 lb (80.74 kg)  BMI 34.76 kg/m2  SpO2 99%  Wt Readings from Last 3 Encounters:  02/28/16 178 lb (80.74 kg)  01/17/16 175 lb (79.379 kg)  01/02/16 178 lb (80.74 kg)    Physical Exam  Constitutional: She is oriented to person, place, and time. She appears well-developed.  HENT:  Head: Normocephalic and atraumatic.  Eyes: EOM are normal.  Neck: Normal range of motion. Neck supple. No tracheal deviation present. No thyromegaly present.  Cardiovascular: Normal rate and regular rhythm.   Pulmonary/Chest: Effort normal and breath sounds normal.  Abdominal: Soft. Bowel sounds are normal. There is no tenderness. There is no guarding.  Musculoskeletal: She exhibits no edema.  Neurological: She is alert and oriented to person, place, and time. She has normal reflexes. No cranial nerve deficit. Coordination normal.  Skin: Skin is warm and dry. No rash noted. No erythema. No pallor.  Psychiatric: She has a normal mood and affect. Judgment normal.    Results for orders placed or performed in visit on 02/21/16  TSH  Result Value Ref Range   TSH 1.74 mIU/L  T4, free  Result Value Ref Range   Free T4 1.4 0.8 - 1.8 ng/dL     Assessment & Plan:   1.  RAI induced Hypothyroidism:   She is status post RAI therapy in September 2016 For hyperthyroidism. Her thyroid function tests are consistent with appropriate replacement. I advised her to continue levothyroxine 88 g by mouth every morning.   - We discussed about correct intake of  levothyroxine, at fasting, with water, separated by at least 30 minutes from breakfast, and separated by more than 4 hours from calcium, iron, multivitamins, acid reflux medications (PPIs). -Patient is made aware of the fact that thyroid hormone replacement is needed for life, dose to be adjusted by periodic monitoring of thyroid function tests.  She is on low-dose metoprolol which was started for blood pressure treatment. I advised her to continue.  I advised patient to maintain close follow up with their PCP for primary care needs. Follow up plan: Return in about 4 months (around 06/29/2016) for underactive thyroid, follow up with pre-visit labs.  Glade Lloyd, MD Phone: [phone]  Fax: [phone]   02/28/2016, 3:49 PM

## 2016-03-20 DIAGNOSIS — R42 Dizziness and giddiness: Secondary | ICD-10-CM | POA: Diagnosis not present

## 2016-03-20 DIAGNOSIS — G629 Polyneuropathy, unspecified: Secondary | ICD-10-CM | POA: Diagnosis not present

## 2016-03-20 DIAGNOSIS — G5710 Meralgia paresthetica, unspecified lower limb: Secondary | ICD-10-CM | POA: Diagnosis not present

## 2016-03-20 DIAGNOSIS — R270 Ataxia, unspecified: Secondary | ICD-10-CM | POA: Diagnosis not present

## 2016-03-20 DIAGNOSIS — M79606 Pain in leg, unspecified: Secondary | ICD-10-CM | POA: Diagnosis not present

## 2016-03-20 DIAGNOSIS — R569 Unspecified convulsions: Secondary | ICD-10-CM | POA: Diagnosis not present

## 2016-03-20 DIAGNOSIS — E559 Vitamin D deficiency, unspecified: Secondary | ICD-10-CM | POA: Diagnosis not present

## 2016-03-28 ENCOUNTER — Ambulatory Visit (INDEPENDENT_AMBULATORY_CARE_PROVIDER_SITE_OTHER): Payer: PPO | Admitting: Physician Assistant

## 2016-03-28 ENCOUNTER — Encounter: Payer: Self-pay | Admitting: Physician Assistant

## 2016-03-28 VITALS — BP 118/66 | HR 66 | Ht 62.0 in | Wt 178.0 lb

## 2016-03-28 DIAGNOSIS — I25119 Atherosclerotic heart disease of native coronary artery with unspecified angina pectoris: Secondary | ICD-10-CM

## 2016-03-28 DIAGNOSIS — E784 Other hyperlipidemia: Secondary | ICD-10-CM | POA: Diagnosis not present

## 2016-03-28 DIAGNOSIS — E785 Hyperlipidemia, unspecified: Secondary | ICD-10-CM

## 2016-03-28 NOTE — Patient Instructions (Signed)
Your physician recommends that you schedule a follow-up appointment with Dr. Bronson Ing Apr 30, 2016  Your physician recommends that you continue on your current medications as directed. Please refer to the Current Medication list given to you today.  If you need a refill on your cardiac medications before your next appointment, please call your pharmacy.  Thank you for choosing [facility]!

## 2016-03-28 NOTE — Progress Notes (Signed)
Cardiology Office Note   Date:  03/28/2016   ID:  Maria Fry, DOB Feb 20, 1951, MRN [id number]  PCP:  Vic Blackbird, MD  Cardiologist:  Dr Consuela Mimes, PA-C   History of Present Illness: Maria Fry is a 65 y.o. female with a history of  NSTEMI w/ distal LAD occlusion treated medically, HTN, hyperthyroid>>RAI>>hypothyroid  Maria Fry presents for post hospital followup.  Since being seen in the office in January, she has done fairly well. She is participating in [employer], and did not have any chest pain or shortness of breath with this. Her activity level is not very high, but she is generally able to do what she wants without chest pain or shortness of breath.  However, there was an episode recently where she woke with wheezing and chest pain. When her symptoms did not resolve, she took a sublingual nitroglycerin. She stated she could feel it worked and she got better. She got up and sat in a chair, and her symptoms improved. She denies new dyspnea on exertion, lower extremity edema, orthopnea or PND. She has no other episodes of chest pain. One of the reasons it frightened her so much, was because she has not had anything like this in months. It is similar but not exactly like the pain she had with her heart attack. She not only had chest pain with her MI but pain in the upper part of her back between her shoulder blades. With the symptoms she had recently, she had pain in the upper part of her back between her shoulder blades.  She is having some problems with pain in the left side of her head when she lays on her left side, she is to follow up with neurology for this.   Past Medical History  Diagnosis Date  . Hypertension   . Hip pain   . Thyroid disease   . CAD (coronary artery disease)     a. cath 06/07/15 Dist LAD 100%, mid LAD 10%.    Past Surgical History  Procedure Laterality Date  . Tubal ligation    . Abdominal hysterectomy    .  Cholecystectomy    . Shoulder arthroscopy    . Heel spurs    . Cardiac catheterization N/A 06/06/2015    Procedure: Left Heart Cath and Coronary Angiography;  Surgeon: Peter M Martinique, MD;  Location: [facility];  Service: Cardiovascular;  Laterality: N/A;    Current Outpatient Prescriptions  Medication Sig Dispense Refill  . acetaminophen (TYLENOL) 500 MG tablet Take 500 mg by mouth every 6 (six) hours as needed for mild pain or moderate pain.    Marland Kitchen aspirin 81 MG chewable tablet Chew 1 tablet (81 mg total) by mouth daily.    Marland Kitchen atorvastatin (LIPITOR) 80 MG tablet Take 1 tablet (80 mg total) by mouth daily at 6 PM. 30 tablet 12  . clopidogrel (PLAVIX) 75 MG tablet Take 1 tablet (75 mg total) by mouth daily. 30 tablet 6  . gabapentin (NEURONTIN) 300 MG capsule Take 300 mg by mouth at bedtime.     Marland Kitchen levothyroxine (SYNTHROID) 88 MCG tablet Take 1 tablet (88 mcg total) by mouth daily before breakfast. 30 tablet 3  . metoprolol tartrate (LOPRESSOR) 25 MG tablet Take 1 1/2 (total 37.5mg ) table by mouth in morning and evening. (Patient taking differently: Take 37.5 mg by mouth 2 (two) times daily. Take 1 1/2 (total 37.5mg ) table by mouth in morning and  evening.) 90 tablet 6  . nitroGLYCERIN (NITROSTAT) 0.4 MG SL tablet Place 1 tablet (0.4 mg total) under the tongue every 5 (five) minutes as needed for chest pain. 25 tablet 3   No current facility-administered medications for this visit.    Allergies:   Demerol; Lisinopril; and Lodine    Social History:  The patient  reports that she quit smoking about 13 years ago. She started smoking about 38 years ago. She has never used smokeless tobacco. She reports that she does not drink alcohol or use illicit drugs.   Family History:  The patient's family history includes Cancer in her brother; Heart attack in her brother and father; Heart failure in her brother and father; Stroke in her sister.    ROS:  Please see the history of present illness. All  other systems are reviewed and negative.    PHYSICAL EXAM: VS:  BP 118/66 mmHg  Pulse 66  Ht 5\' 2"  (1.575 m)  Wt 178 lb (80.74 kg)  BMI 32.55 kg/m2  SpO2 94% , BMI Body mass index is 32.55 kg/(m^2). GEN: Well nourished, well developed, female in no acute distress HEENT: normal for age  Neck: no JVD, no carotid bruit, no masses Cardiac: RRR; soft murmur, no rubs, or gallops Respiratory:  clear to auscultation bilaterally, normal work of breathing GI: soft, nontender, nondistended, + BS MS: no deformity or atrophy; no edema; distal pulses are 2+ in all 4 extremities  Skin: warm and dry, no rash Neuro:  Strength and sensation are intact Psych: euthymic mood, full affect   EKG:  EKG is ordered today.  Recent Labs: 06/05/2015: B Natriuretic Peptide 1111.0* 06/07/2015: Magnesium 2.0 01/17/2016: ALT 30; BUN 9; Creatinine, Ser 0.75; Hemoglobin 11.6*; Platelets 217; Potassium 3.3*; Sodium 137 02/21/2016: TSH 1.74    Lipid Panel    Component Value Date/Time   CHOL 145 06/06/2015 0902   TRIG 49 06/06/2015 0902   HDL 34* 06/06/2015 0902   CHOLHDL 4.3 06/06/2015 0902   VLDL 10 06/06/2015 0902   LDLCALC 101* 06/06/2015 0902     Wt Readings from Last 3 Encounters:  03/28/16 178 lb (80.74 kg)  02/28/16 178 lb (80.74 kg)  01/17/16 175 lb (79.379 kg)     Other studies Reviewed: Additional studies/ records that were reviewed today include: office notes and hospital records.  ASSESSMENT AND PLAN:  1.  CAD: Ms. Tuccio is on good medical therapy with aspirin, high-dose statin, Plavix, and a beta blocker.her blood pressure and heart rate are under good control. She is tolerating her medications well.  We had a long discussion regarding angina and options to treat it. She was advised that they're multiple medications we can use for angina, but does not wish to start Imdur at this time. Since she has had only the one episode severe enough to make her take nitroglycerin in the last 8 months,  she wishes to hold off for now.  She is going to the [facility] 3 times a week and is encouraged to continue this. If she gets additional episodes of angina, she understands that she can call us and we can add Imdur at any time. Blood pressure or side effects will not tolerate Imdur, she can be started on Ranexa.  2. Dyslipidemia: Recheck cholesterol 06/2016.   Current medicines are reviewed at length with the patient today.  The patient does not have concerns regarding medicines.  The following changes have been made:  no change  Labs/ tests ordered today include:  No orders of the defined types were placed in this encounter.     Disposition:   FU with Dr. Bronson Ing  Signed, Rosaria Ferries, PA-C  03/28/2016 3:03 PM    [facility] Phone: [phone]; Fax: [phone]  This note was written with the assistance of speech recognition software. Please excuse any transcriptional errors.

## 2016-04-09 ENCOUNTER — Ambulatory Visit: Payer: PPO | Admitting: Family Medicine

## 2016-04-11 DIAGNOSIS — R569 Unspecified convulsions: Secondary | ICD-10-CM | POA: Diagnosis not present

## 2016-04-22 ENCOUNTER — Inpatient Hospital Stay (HOSPITAL_COMMUNITY): Payer: PPO

## 2016-04-22 ENCOUNTER — Encounter: Payer: Self-pay | Admitting: Family Medicine

## 2016-04-22 ENCOUNTER — Inpatient Hospital Stay (HOSPITAL_COMMUNITY)
Admission: EM | Admit: 2016-04-22 | Discharge: 2016-04-25 | DRG: 293 | Disposition: A | Discharge status: Home / Self Care | Payer: PPO | Attending: Internal Medicine | Admitting: Internal Medicine

## 2016-04-22 ENCOUNTER — Encounter (HOSPITAL_COMMUNITY): Payer: Self-pay | Admitting: Emergency Medicine

## 2016-04-22 ENCOUNTER — Ambulatory Visit (INDEPENDENT_AMBULATORY_CARE_PROVIDER_SITE_OTHER): Payer: PPO | Admitting: Family Medicine

## 2016-04-22 ENCOUNTER — Emergency Department (HOSPITAL_COMMUNITY): Payer: PPO

## 2016-04-22 VITALS — BP 132/66 | HR 70 | Temp 97.5°F | Resp 14 | Ht 63.0 in | Wt 178.0 lb

## 2016-04-22 DIAGNOSIS — R51 Headache: Secondary | ICD-10-CM | POA: Diagnosis not present

## 2016-04-22 DIAGNOSIS — I5023 Acute on chronic systolic (congestive) heart failure: Secondary | ICD-10-CM

## 2016-04-22 DIAGNOSIS — I2510 Atherosclerotic heart disease of native coronary artery without angina pectoris: Secondary | ICD-10-CM

## 2016-04-22 DIAGNOSIS — T783XXA Angioneurotic edema, initial encounter: Secondary | ICD-10-CM | POA: Diagnosis not present

## 2016-04-22 DIAGNOSIS — Z7982 Long term (current) use of aspirin: Secondary | ICD-10-CM

## 2016-04-22 DIAGNOSIS — I5021 Acute systolic (congestive) heart failure: Secondary | ICD-10-CM | POA: Diagnosis present

## 2016-04-22 DIAGNOSIS — Z7902 Long term (current) use of antithrombotics/antiplatelets: Secondary | ICD-10-CM | POA: Diagnosis not present

## 2016-04-22 DIAGNOSIS — I340 Nonrheumatic mitral (valve) insufficiency: Secondary | ICD-10-CM | POA: Diagnosis not present

## 2016-04-22 DIAGNOSIS — R079 Chest pain, unspecified: Secondary | ICD-10-CM

## 2016-04-22 DIAGNOSIS — Z8249 Family history of ischemic heart disease and other diseases of the circulatory system: Secondary | ICD-10-CM | POA: Diagnosis not present

## 2016-04-22 DIAGNOSIS — I509 Heart failure, unspecified: Secondary | ICD-10-CM

## 2016-04-22 DIAGNOSIS — E039 Hypothyroidism, unspecified: Secondary | ICD-10-CM | POA: Diagnosis not present

## 2016-04-22 DIAGNOSIS — I25110 Atherosclerotic heart disease of native coronary artery with unstable angina pectoris: Secondary | ICD-10-CM | POA: Diagnosis not present

## 2016-04-22 DIAGNOSIS — I5043 Acute on chronic combined systolic (congestive) and diastolic (congestive) heart failure: Secondary | ICD-10-CM | POA: Diagnosis not present

## 2016-04-22 DIAGNOSIS — I10 Essential (primary) hypertension: Secondary | ICD-10-CM | POA: Diagnosis present

## 2016-04-22 DIAGNOSIS — R0789 Other chest pain: Secondary | ICD-10-CM | POA: Diagnosis present

## 2016-04-22 DIAGNOSIS — Y92239 Unspecified place in hospital as the place of occurrence of the external cause: Secondary | ICD-10-CM | POA: Diagnosis not present

## 2016-04-22 DIAGNOSIS — I25118 Atherosclerotic heart disease of native coronary artery with other forms of angina pectoris: Secondary | ICD-10-CM | POA: Diagnosis not present

## 2016-04-22 DIAGNOSIS — Z885 Allergy status to narcotic agent status: Secondary | ICD-10-CM | POA: Diagnosis not present

## 2016-04-22 DIAGNOSIS — T50995A Adverse effect of other drugs, medicaments and biological substances, initial encounter: Secondary | ICD-10-CM | POA: Diagnosis not present

## 2016-04-22 DIAGNOSIS — I200 Unstable angina: Secondary | ICD-10-CM | POA: Diagnosis not present

## 2016-04-22 DIAGNOSIS — R0600 Dyspnea, unspecified: Secondary | ICD-10-CM | POA: Diagnosis present

## 2016-04-22 DIAGNOSIS — Z79899 Other long term (current) drug therapy: Secondary | ICD-10-CM | POA: Diagnosis not present

## 2016-04-22 DIAGNOSIS — R0609 Other forms of dyspnea: Secondary | ICD-10-CM | POA: Diagnosis present

## 2016-04-22 DIAGNOSIS — I110 Hypertensive heart disease with heart failure: Principal | ICD-10-CM | POA: Diagnosis present

## 2016-04-22 DIAGNOSIS — Z888 Allergy status to other drugs, medicaments and biological substances status: Secondary | ICD-10-CM | POA: Diagnosis not present

## 2016-04-22 DIAGNOSIS — I2583 Coronary atherosclerosis due to lipid rich plaque: Principal | ICD-10-CM

## 2016-04-22 DIAGNOSIS — E559 Vitamin D deficiency, unspecified: Secondary | ICD-10-CM | POA: Diagnosis present

## 2016-04-22 DIAGNOSIS — R0602 Shortness of breath: Secondary | ICD-10-CM | POA: Diagnosis not present

## 2016-04-22 DIAGNOSIS — E785 Hyperlipidemia, unspecified: Secondary | ICD-10-CM | POA: Diagnosis present

## 2016-04-22 DIAGNOSIS — I5041 Acute combined systolic (congestive) and diastolic (congestive) heart failure: Secondary | ICD-10-CM | POA: Diagnosis not present

## 2016-04-22 DIAGNOSIS — Z87891 Personal history of nicotine dependence: Secondary | ICD-10-CM

## 2016-04-22 DIAGNOSIS — I252 Old myocardial infarction: Secondary | ICD-10-CM

## 2016-04-22 HISTORY — DX: Hypothyroidism, unspecified: E03.9

## 2016-04-22 HISTORY — DX: Essential (primary) hypertension: I10

## 2016-04-22 HISTORY — DX: Acute on chronic combined systolic (congestive) and diastolic (congestive) heart failure: I50.43

## 2016-04-22 LAB — CBC WITH DIFFERENTIAL/PLATELET
Basophils Absolute: 0 10*3/uL (ref 0.0–0.1)
Basophils Relative: 0 %
Eosinophils Absolute: 0.1 10*3/uL (ref 0.0–0.7)
Eosinophils Relative: 1 %
HCT: 36.7 % (ref 36.0–46.0)
Hemoglobin: 12.1 g/dL (ref 12.0–15.0)
Lymphocytes Relative: 26 %
Lymphs Abs: 2.1 10*3/uL (ref 0.7–4.0)
MCH: 28.9 pg (ref 26.0–34.0)
MCHC: 33 g/dL (ref 30.0–36.0)
MCV: 87.8 fL (ref 78.0–100.0)
Monocytes Absolute: 0.3 10*3/uL (ref 0.1–1.0)
Monocytes Relative: 4 %
Neutro Abs: 5.4 10*3/uL (ref 1.7–7.7)
Neutrophils Relative %: 69 %
Platelets: 287 10*3/uL (ref 150–400)
RBC: 4.18 MIL/uL (ref 3.87–5.11)
RDW: 15.4 % (ref 11.5–15.5)
WBC: 7.9 10*3/uL (ref 4.0–10.5)

## 2016-04-22 LAB — BASIC METABOLIC PANEL
Anion gap: 6 (ref 5–15)
BUN: 14 mg/dL (ref 6–20)
CO2: 26 mmol/L (ref 22–32)
Calcium: 8.7 mg/dL — ABNORMAL LOW (ref 8.9–10.3)
Chloride: 104 mmol/L (ref 101–111)
Creatinine, Ser: 0.91 mg/dL (ref 0.44–1.00)
GFR calc Af Amer: >60 mL/min (ref >60)
GFR calc non Af Amer: >60 mL/min (ref >60)
Glucose, Bld: 102 mg/dL — ABNORMAL HIGH (ref 65–99)
Potassium: 3.9 mmol/L (ref 3.5–5.1)
Sodium: 136 mmol/L (ref 135–145)

## 2016-04-22 LAB — TROPONIN I
Troponin I: <0.03 ng/mL (ref <0.031)
Troponin I: <0.03 ng/mL (ref <0.031)

## 2016-04-22 LAB — D-DIMER, QUANTITATIVE (NOT AT ARMC): D-Dimer, Quant: 1.83 ug/mL-FEU — ABNORMAL HIGH (ref 0.00–0.50)

## 2016-04-22 LAB — MAGNESIUM: Magnesium: 2.1 mg/dL (ref 1.7–2.4)

## 2016-04-22 LAB — BRAIN NATRIURETIC PEPTIDE: B Natriuretic Peptide: 1527 pg/mL — ABNORMAL HIGH (ref 0.0–100.0)

## 2016-04-22 MED ORDER — SODIUM CHLORIDE 0.9% FLUSH
3.0000 mL | INTRAVENOUS | Status: DC | PRN
  Administered 2016-04-24: 3 mL via INTRAVENOUS
  Filled 2016-04-22: qty 3

## 2016-04-22 MED ORDER — POTASSIUM CHLORIDE CRYS ER 20 MEQ PO TBCR
20.0000 meq | EXTENDED_RELEASE_TABLET | Freq: Two times a day (BID) | ORAL | Status: DC
  Administered 2016-04-22 – 2016-04-25 (×6): 20 meq via ORAL
  Filled 2016-04-22 (×6): qty 1

## 2016-04-22 MED ORDER — FUROSEMIDE 10 MG/ML IJ SOLN
20.0000 mg | Freq: Once | INTRAMUSCULAR | Status: AC
Start: 2016-04-22 — End: 2016-04-22
  Administered 2016-04-22: 20 mg via INTRAVENOUS
  Filled 2016-04-22: qty 2

## 2016-04-22 MED ORDER — GABAPENTIN 300 MG PO CAPS
300.0000 mg | ORAL_CAPSULE | Freq: Every day | ORAL | Status: DC
  Administered 2016-04-22 – 2016-04-24 (×3): 300 mg via ORAL
  Filled 2016-04-22 (×3): qty 1

## 2016-04-22 MED ORDER — LEVOTHYROXINE SODIUM 88 MCG PO TABS
88.0000 ug | ORAL_TABLET | Freq: Every day | ORAL | Status: DC
  Administered 2016-04-23 – 2016-04-25 (×3): 88 ug via ORAL
  Filled 2016-04-22 (×3): qty 1

## 2016-04-22 MED ORDER — FUROSEMIDE 10 MG/ML IJ SOLN
20.0000 mg | Freq: Two times a day (BID) | INTRAMUSCULAR | Status: DC
  Administered 2016-04-22 – 2016-04-23 (×2): 20 mg via INTRAVENOUS
  Filled 2016-04-22 (×2): qty 2

## 2016-04-22 MED ORDER — ASPIRIN 81 MG PO CHEW
324.0000 mg | CHEWABLE_TABLET | Freq: Once | ORAL | Status: AC
  Administered 2016-04-22: 324 mg via ORAL
  Filled 2016-04-22: qty 4

## 2016-04-22 MED ORDER — SODIUM CHLORIDE 0.9 % IV SOLN: 250.0000 mL | INTRAVENOUS | Status: DC | PRN

## 2016-04-22 MED ORDER — CLOPIDOGREL BISULFATE 75 MG PO TABS
75.0000 mg | ORAL_TABLET | Freq: Every day | ORAL | Status: DC
  Administered 2016-04-23 – 2016-04-25 (×3): 75 mg via ORAL
  Filled 2016-04-22 (×3): qty 1

## 2016-04-22 MED ORDER — METOPROLOL TARTRATE 25 MG PO TABS
37.5000 mg | ORAL_TABLET | Freq: Two times a day (BID) | ORAL | Status: DC
  Administered 2016-04-23 – 2016-04-25 (×5): 37.5 mg via ORAL
  Filled 2016-04-22 (×6): qty 2

## 2016-04-22 MED ORDER — ATORVASTATIN CALCIUM 40 MG PO TABS
80.0000 mg | ORAL_TABLET | Freq: Every day | ORAL | Status: DC
  Administered 2016-04-22 – 2016-04-23 (×2): 80 mg via ORAL
  Filled 2016-04-22 (×2): qty 2

## 2016-04-22 MED ORDER — ISOSORB DINITRATE-HYDRALAZINE 20-37.5 MG PO TABS
1.0000 | ORAL_TABLET | Freq: Three times a day (TID) | ORAL | Status: DC
  Administered 2016-04-22 – 2016-04-24 (×5): 1 via ORAL
  Filled 2016-04-22 (×11): qty 1

## 2016-04-22 MED ORDER — ACETAMINOPHEN 325 MG PO TABS
650.0000 mg | ORAL_TABLET | ORAL | Status: DC | PRN
  Administered 2016-04-23 – 2016-04-24 (×5): 650 mg via ORAL
  Filled 2016-04-22 (×6): qty 2

## 2016-04-22 MED ORDER — ASPIRIN 81 MG PO CHEW
81.0000 mg | CHEWABLE_TABLET | Freq: Every day | ORAL | Status: DC
  Administered 2016-04-23 – 2016-04-25 (×3): 81 mg via ORAL
  Filled 2016-04-22 (×3): qty 1

## 2016-04-22 MED ORDER — ISOSORB DINITRATE-HYDRALAZINE 20-37.5 MG PO TABS
ORAL_TABLET | ORAL | Status: AC
Start: 2016-04-22 — End: 2016-04-22
  Filled 2016-04-22: qty 1

## 2016-04-22 MED ORDER — ISOSORBIDE MONONITRATE ER 60 MG PO TB24
30.0000 mg | ORAL_TABLET | Freq: Every day | ORAL | Status: DC
  Administered 2016-04-22: 30 mg via ORAL
  Filled 2016-04-22 (×4): qty 1

## 2016-04-22 MED ORDER — ENOXAPARIN SODIUM 40 MG/0.4ML ~~LOC~~ SOLN
40.0000 mg | SUBCUTANEOUS | Status: DC
Start: 2016-04-22 — End: 2016-04-25
  Administered 2016-04-22 – 2016-04-24 (×3): 40 mg via SUBCUTANEOUS
  Filled 2016-04-22 (×3): qty 0.4

## 2016-04-22 MED ORDER — NITROGLYCERIN 0.4 MG SL SUBL
0.4000 mg | SUBLINGUAL_TABLET | Freq: Once | SUBLINGUAL | Status: AC
  Administered 2016-04-22: 0.4 mg via SUBLINGUAL

## 2016-04-22 MED ORDER — ONDANSETRON HCL 4 MG/2ML IJ SOLN
4.0000 mg | Freq: Four times a day (QID) | INTRAMUSCULAR | Status: DC | PRN
  Administered 2016-04-24: 4 mg via INTRAVENOUS
  Filled 2016-04-22: qty 2

## 2016-04-22 MED ORDER — SODIUM CHLORIDE 0.9% FLUSH
3.0000 mL | Freq: Two times a day (BID) | INTRAVENOUS | Status: DC
Start: 2016-04-22 — End: 2016-04-25
  Administered 2016-04-22 – 2016-04-25 (×6): 3 mL via INTRAVENOUS

## 2016-04-22 MED ORDER — ZOLPIDEM TARTRATE 5 MG PO TABS
5.0000 mg | ORAL_TABLET | Freq: Every evening | ORAL | Status: DC | PRN
  Filled 2016-04-22 (×2): qty 1

## 2016-04-22 NOTE — Progress Notes (Signed)
Patient ID: Maria Fry, female   DOB: 1951-01-10, 65 y.o.   MRN: [id number]    Subjective:    Patient ID: Maria Fry, female    DOB: Apr 30, 1951, 65 y.o.   MRN: [id number]  Patient presents for New Patient CPE Issue here to establish care however she presents with chest pain. She's history coronary artery disease/ NSTEMI she does not have any stents has had myocardial infarction seems like they're doing aggressive treatment of her risk factors. She's been having some problems with angina over the past few weeks. She did see her cardiologist on 4/27 at that time she only used one dose of nitroglycerin they recommended that she be started on a low-dose of indoor if her angina persisted. She has appointment to see them on Monday. She was in her normal state of health until this morning when she woke up around 5:00am she could not catch her breath she felt like she was being smothered she was also wheezing. She proceeded to get up and get dressed she tried to get the grocery store this morning but felt very fatigued and had chest discomfort substernal nonradiating no nausea vomiting or diaphoresis, chest pain feels like previous anginal pain. Her shortness of breath also continue she states she feels like the blood has been sucked out of her. She took a nitroglycerin around 9:30 this morning which helped but she is still having chest discomfort and does not feel like herself. She is taking all of her morning medications.  She also has underlying hypothyroidism after she had radioactive iodine therapy for hyperthyroid in 2016    Review Of Systems:  GEN- denies fatigue, fever, weight loss,weakness, recent illness HEENT- denies eye drainage, change in vision, nasal discharge, CVS-+chest pain, denies palpitations RESP-+ SOB, cough, wheeze ABD- denies N/V, change in stools, abd pain GU- denies dysuria, hematuria, dribbling, incontinence MSK- denies joint pain, muscle aches, injury Neuro- denies headache,  dizziness, syncope, seizure activity       Objective:    BP 132/66 mmHg  Pulse 70  Temp(Src) 97.5 F (36.4 C) (Oral)  Resp 14  Ht 5\' 3"  (1.6 m)  Wt 178 lb (80.74 kg)  BMI 31.54 kg/m2 GEN- NAD, alert and oriented x3 HEENT- PERRL, EOMI, non injected sclera, pink conjunctiva, MMM, oropharynx clear Neck- Supple, no thyromegaly CVS- RRR, no murmur RESP-CTAB ABD-NABS,soft,NT,ND EXT- No edema Pulses- Radial, DP- 2+  EKG- NSR, flat t waves lateral leads, no ST elevation   Given NTG 0.4mg  SL in office, which helped some of pain- discussed with daughter and pt, she agrees to go to ER for evaluation . Report called to [facility] hospital by my nurse at 12:30pm    Assessment & Plan:      Problem List Items Addressed This Visit    Hypertension    Blood pressure stable      Relevant Medications   nitroGLYCERIN (NITROSTAT) SL tablet 0.4 mg (Completed)   Chest pain    Concern for unstable angina in setting of CAD and previous NSTEMI, given NTG in office which helped, alreay had ASA Plavix, send to ER for chest pain I think she needs troponins based on history Vitals were stable      Relevant Medications   nitroGLYCERIN (NITROSTAT) SL tablet 0.4 mg (Completed)   CAD (coronary artery disease) - Primary   Relevant Medications   nitroGLYCERIN (NITROSTAT) SL tablet 0.4 mg (Completed)   Other Relevant Orders   EKG 12-Lead (Completed)  Note: This dictation was prepared with Dragon dictation along with smaller phrase technology. Any transcriptional errors that result from this process are unintentional.

## 2016-04-22 NOTE — Assessment & Plan Note (Signed)
Blood pressure stable ? ?

## 2016-04-22 NOTE — ED Notes (Signed)
Pt ambulated to the restroom. Pt did not need an assist. Oxygen did drop to 87% on RA.

## 2016-04-22 NOTE — H&P (Signed)
[facility] History and Physical  Maria Fry [id number] DOB: 06-22-1951 DOA: 04/22/2016  Referring physician: Dr Wyvonnia Dusky PCP: Vic Blackbird, MD   Chief Complaint: SOB/ DOE and chest pressure  HPI: Maria Fry is a 65 y.o. female with history of MI/ CAD sp cath July 2016, HTN, HL and hypothyroidism, presented to PCP's office with progressive exertional chest pressure and dyspnea.  Told to come to ED where w/u shows CXR with IS edema and effusions c/w CHF. Trop not sig ^. EKG unremarkable.  Asked to see for admission. No prior hist of CHF.    Patient describes chest pressure, "like a balloon blowing up in my chest", sometimes at rest , sometimes w exertion.  Taking SL NTG about 3 x per week for this w relief.  Over the last 1-2 months also has noted significant DOE w washing clothes, going up stairs, even doing dishes makes her very tired and SOB.  +Orthopnea, unable to lie flat some nights, has to get up to breathe. Nonprod cough, no fevers, chills or sweats.    Quit smoking 03/12/2003, no etoh.  Single, 3 grown children , lives w a friend.  Born in [place] near [place], moved to [place] near [place] age 24.  Went to [employer].  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 Retired in 11-Mar-2012.  YOungest of 9 children, sister her with her today.  HTN since 12-Mar-2003.  Father died of massive heart attack age 45.  Mother died in a 58.       ROS  no joint pain   no HA  no blurry vision  no rash  no diarrhea  no nausea/ vomiting  no dysuria  no difficulty voiding  no change in urine color    Where does patient live home Can patient participate in ADLs? yes  Past Medical History  Past Medical History  Diagnosis Date  . Hypertension   . Hip pain   . Thyroid disease   . CAD (coronary artery disease)     a. cath 06/07/15 Dist LAD 100%, mid LAD 10%.  . Hyperlipidemia   . Vitamin D deficiency    Past  Surgical History  Past Surgical History  Procedure Laterality Date  . Tubal ligation    . Abdominal hysterectomy    . Cholecystectomy    . Shoulder arthroscopy    . Heel spurs    . Cardiac catheterization N/A 06/06/2015    Procedure: Left Heart Cath and Coronary Angiography;  Surgeon: Peter M Martinique, MD;  Location: [facility];  Service: Cardiovascular;  Laterality: N/A;   Family History  Family History  Problem Relation Age of Onset  . Heart failure Father     Deceased  . Stroke Sister     Deceased  . Heart failure Brother     Deceased  . Cancer Brother     Deceased  . Heart attack Father     Deceased  . Heart attack Brother     Deceased   Social History  reports that she quit smoking about 13 years ago. She started smoking about 38 years ago. She has never used smokeless tobacco. She reports that she does not drink alcohol or use illicit drugs. Allergies  Allergies  Allergen Reactions  . Demerol [Meperidine] Nausea And Vomiting  . Lisinopril     Throat swollen  . Lodine [Etodolac] Rash   Home  medications Prior to Admission medications   Medication Sig Start Date End Date Taking? Authorizing Provider  acetaminophen (TYLENOL) 500 MG tablet Take 500 mg by mouth every 6 (six) hours as needed for mild pain or moderate pain. Reported on 04/22/2016   Yes Historical Provider, MD  aspirin 81 MG chewable tablet Chew 1 tablet (81 mg total) by mouth daily. 06/08/15  Yes Bhavinkumar Bhagat, PA  Aspirin-Acetaminophen-Caffeine (GOODY HEADACHE PO) Take 1 Package by mouth daily as needed (headache).   Yes Historical Provider, MD  atorvastatin (LIPITOR) 80 MG tablet Take 1 tablet (80 mg total) by mouth daily at 6 PM. 10/02/15  Yes [name], MD  clopidogrel (PLAVIX) 75 MG tablet Take 1 tablet (75 mg total) by mouth daily. 10/02/15  Yes [name], MD  gabapentin (NEURONTIN) 300 MG capsule Take 300 mg by mouth at bedtime.    Yes Historical Provider, MD  levothyroxine  (SYNTHROID) 88 MCG tablet Take 1 tablet (88 mcg total) by mouth daily before breakfast. 02/28/16  Yes Cassandria Anger, MD  metoprolol tartrate (LOPRESSOR) 25 MG tablet Take 1 1/2 (total 37.5mg ) table by mouth in morning and evening. Patient taking differently: Take 37.5 mg by mouth 2 (two) times daily. Take 1 1/2 (total 37.5mg ) table by mouth in morning and evening. 10/02/15  Yes [name], MD  nitroGLYCERIN (NITROSTAT) 0.4 MG SL tablet Place 1 tablet (0.4 mg total) under the tongue every 5 (five) minutes as needed for chest pain. 10/02/15  Yes [name], MD  Vitamin D, Ergocalciferol, (DRISDOL) 50000 units CAPS capsule Take 50,000 Units by mouth every 7 (seven) days. Takes on Wednesday's.   Yes Historical Provider, MD   Liver Function Tests No results for input(s): AST, ALT, ALKPHOS, BILITOT, PROT, ALBUMIN in the last 168 hours. No results for input(s): LIPASE, AMYLASE in the last 168 hours. CBC  Recent Labs Lab 04/22/16 1327  WBC 7.9  NEUTROABS 5.4  HGB 12.1  HCT 36.7  MCV 87.8  PLT [id number]   Basic Metabolic Panel  Recent Labs Lab 04/22/16 1327  NA 136  K 3.9  CL 104  CO2 26  GLUCOSE 102*  BUN 14  CREATININE 0.91  CALCIUM 8.7*     Filed Vitals:   04/22/16 1544 04/22/16 1600 04/22/16 1715 04/22/16 1813  BP: 137/90 160/95 160/95 157/80  Pulse: 71 84 67 70  Temp:    97.5 F (36.4 C)  TempSrc:    Oral  Resp:  28 16 14   Height:    5\' 5"  (1.651 m)  Weight:    78.518 kg (173 lb 1.6 oz)  SpO2:  95% 94% 96%   Exam: Gen alert, calm no distress No rash, cyanosis or gangrene Sclera anicteric, throat clear  Mild JVD Chest rales on L to inf scapula border, rales R base , no wheezing RRR soft mid syst SEM, no RG Abd soft ntnd no mass or ascites +bs GU defer MS no joint effusions or deformity Ext no LE or UE edema / no wounds or ulcers Neuro is alert, Ox 3 , nf   EKG (independently reviewed) > NSR, no acute changes , normal EKG CXR (independently  reviewed) > IS edema and bilat small effusion c/w CHF    Assessment: 1. New onset congestive heart failure - last echo normal 2016.  Admit, IV lasix q 12hr, low Na diet.  Allergic to ACEi, already on BB. Will add bidil. Consider cards consult in am.   2. HTN  on medication 13 yrs 3. Hypothyroid 4. CAD hx cath 2016 w distal LAD 100% occlusion, not treated. Other vessels were open.  No signs of EKG changes tonight, suspect chest discomfort due to CHF not ischemia.   Plan - as above   DVT Prophylaxis lovenox  Code Status: full  Family Communication: sister here  Disposition Plan: home when better    Sol Blazing [facility] Pager [phone]  Cell [phone]  If 7PM-7AM, please contact night-coverage [email or web address] Password [email or web address] 04/22/2016, 7:01 PM

## 2016-04-22 NOTE — Assessment & Plan Note (Addendum)
Concern for unstable angina in setting of CAD and previous NSTEMI, given NTG in office which helped, alreay had ASA Plavix, send to ER for chest pain I think she needs troponins based on history Vitals were stable

## 2016-04-22 NOTE — ED Provider Notes (Signed)
CSN: [id number]     Arrival date & time 04/22/16  1246 History   First MD Initiated Contact with Patient 04/22/16 1342     Chief Complaint  Patient presents with  . Chest Pain     (Consider location/radiation/quality/duration/timing/severity/associated sxs/prior Treatment) HPI Comments: Patient presents from PCPs office with episode of chest pain onset this morning around 5 AM. Describes it as angina which she has been having intermittently over the past several weeks. She went to the store but had to go home because she had increasing pain and felt like she was given a tetanus out. She took 2 nitroglycerin today without relief and is pain-free now. Pain lasted about one hour or more. Patient's PCP give her another nitroglycerin and sensory to the ED. History of MI in 2016 with that was medically managed without stents. This pain is similar. It is not radiate to her back as it did previously. There is no shortness of breath. There is nausea but no diaphoresis. She is pain-free currently. Patient saw the cardiologist on April 27 of starting her on a long-acting nitroglycerin. She states she's had this essentially daily pain since then but does not use nitroglycerin until it is "really bad".  The history is provided by the patient and a relative.    Past Medical History  Diagnosis Date  . Hypertension   . Hip pain   . Thyroid disease   . CAD (coronary artery disease)     a. cath 06/07/15 Dist LAD 100%, mid LAD 10%.  . Hyperlipidemia   . Vitamin D deficiency    Past Surgical History  Procedure Laterality Date  . Tubal ligation    . Abdominal hysterectomy    . Cholecystectomy    . Shoulder arthroscopy    . Heel spurs    . Cardiac catheterization N/A 06/06/2015    Procedure: Left Heart Cath and Coronary Angiography;  Surgeon: Peter M Martinique, MD;  Location: [facility];  Service: Cardiovascular;  Laterality: N/A;   Family History  Problem Relation Age of Onset  . Heart failure  Father     Deceased  . Stroke Sister     Deceased  . Heart failure Brother     Deceased  . Cancer Brother     Deceased  . Heart attack Father     Deceased  . Heart attack Brother     Deceased   Social History  Substance Use Topics  . Smoking status: Former Smoker    Start date: 12/02/1977    Quit date: 12/02/2002  . Smokeless tobacco: Never Used  . Alcohol Use: No   OB History    Gravida Para Term Preterm AB TAB SAB Ectopic Multiple Living   3 3 3             Review of Systems  Constitutional: Positive for activity change. Negative for fever, diaphoresis and fatigue.  HENT: Negative for congestion.   Eyes: Negative for visual disturbance.  Respiratory: Positive for chest tightness and shortness of breath. Negative for cough.   Cardiovascular: Positive for chest pain.  Gastrointestinal: Positive for nausea. Negative for vomiting and abdominal pain.  Genitourinary: Negative for dysuria, hematuria, vaginal bleeding and vaginal discharge.  Musculoskeletal: Negative for myalgias and arthralgias.  Skin: Negative for rash.  Neurological: Negative for dizziness, weakness and headaches.  A complete 10 system review of systems was obtained and all systems are negative except as noted in the HPI and PMH.  Allergies  Demerol; Lisinopril; and Lodine  Home Medications   Prior to Admission medications   Medication Sig Start Date End Date Taking? Authorizing Provider  acetaminophen (TYLENOL) 500 MG tablet Take 500 mg by mouth every 6 (six) hours as needed for mild pain or moderate pain. Reported on 04/22/2016    Historical Provider, MD  aspirin 81 MG chewable tablet Chew 1 tablet (81 mg total) by mouth daily. 06/08/15   Bhavinkumar Bhagat, PA  atorvastatin (LIPITOR) 80 MG tablet Take 1 tablet (80 mg total) by mouth daily at 6 PM. 10/02/15   [name], MD  clopidogrel (PLAVIX) 75 MG tablet Take 1 tablet (75 mg total) by mouth daily. 10/02/15   [name], MD   gabapentin (NEURONTIN) 300 MG capsule Take 300 mg by mouth at bedtime.     Historical Provider, MD  levothyroxine (SYNTHROID) 88 MCG tablet Take 1 tablet (88 mcg total) by mouth daily before breakfast. 02/28/16   Cassandria Anger, MD  metoprolol tartrate (LOPRESSOR) 25 MG tablet Take 1 1/2 (total 37.5mg ) table by mouth in morning and evening. Patient taking differently: Take 37.5 mg by mouth 2 (two) times daily. Take 1 1/2 (total 37.5mg ) table by mouth in morning and evening. 10/02/15   [name], MD  nitroGLYCERIN (NITROSTAT) 0.4 MG SL tablet Place 1 tablet (0.4 mg total) under the tongue every 5 (five) minutes as needed for chest pain. 10/02/15   [name], MD  Vitamin D, Ergocalciferol, (DRISDOL) 50000 units CAPS capsule Take 50,000 Units by mouth every 7 (seven) days.    Historical Provider, MD   BP 155/109 mmHg  Pulse 78  Temp(Src) 97.7 F (36.5 C) (Oral)  Resp 16  Ht 5' (1.524 m)  Wt 170 lb (77.111 kg)  BMI 33.20 kg/m2  SpO2 98% Physical Exam  Constitutional: She is oriented to person, place, and time. She appears well-developed and well-nourished. No distress.  HENT:  Head: Normocephalic and atraumatic.  Mouth/Throat: Oropharynx is clear and moist. No oropharyngeal exudate.  Eyes: Conjunctivae and EOM are normal. Pupils are equal, round, and reactive to light.  Neck: Normal range of motion. Neck supple.  No meningismus.  Cardiovascular: Normal rate, regular rhythm, normal heart sounds and intact distal pulses.   No murmur heard. Pulmonary/Chest: Effort normal. No respiratory distress. She has rales. She exhibits no tenderness.  Abdominal: Soft. There is no tenderness. There is no rebound and no guarding.  Musculoskeletal: Normal range of motion. She exhibits no edema or tenderness.  Neurological: She is alert and oriented to person, place, and time. No cranial nerve deficit. She exhibits normal muscle tone. Coordination normal.  No ataxia on finger to  nose bilaterally. No pronator drift. 5/5 strength throughout. CN 2-12 intact.Equal grip strength. Sensation intact.   Skin: Skin is warm.  Psychiatric: She has a normal mood and affect. Her behavior is normal.  Nursing note and vitals reviewed.   ED Course  Procedures (including critical care time) Labs Review Labs Reviewed  BASIC METABOLIC PANEL - Abnormal; Notable for the following:    Glucose, Bld 102 (*)    Calcium 8.7 (*)    All other components within normal limits  BRAIN NATRIURETIC PEPTIDE - Abnormal; Notable for the following:    B Natriuretic Peptide 1527.0 (*)    All other components within normal limits  D-DIMER, QUANTITATIVE (NOT AT [facility]) - Abnormal; Notable for the following:    D-Dimer, Quant 1.83 (*)    All other components  within normal limits  CBC WITH DIFFERENTIAL/PLATELET  TROPONIN I  TROPONIN I  MAGNESIUM  BASIC METABOLIC PANEL    Imaging Review Dg Chest 2 View  04/22/2016  CLINICAL DATA:  Chest pain and short of breath EXAM: CHEST  2 VIEW COMPARISON:  01/17/2016 FINDINGS: Patchy bibasilar airspace disease has progressed in the interval. Cardiac enlargement with pulmonary vascular congestion. Small bilateral effusions. Probable mild interstitial edema. IMPRESSION: Mild congestive heart failure with interstitial edema and small pleural effusions Bibasilar atelectasis. Electronically Signed   By: Franchot Gallo M.D.   On: 04/22/2016 13:16   I have personally reviewed and evaluated these images and lab results as part of my medical decision-making.   EKG Interpretation   Date/Time:  Monday Apr 22 2016 12:53:02 EDT Ventricular Rate:  76 PR Interval:  186 QRS Duration: 92 QT Interval:  384 QTC Calculation: 432 R Axis:   39 Text Interpretation:  Normal sinus rhythm Septal infarct , age  undetermined Abnormal ECG Nonspecific ST abnormality Confirmed by Wyvonnia Dusky   MD, Raji Glinski [phone]) on 04/22/2016 1:41:15 PM      MDM   Final diagnoses:  Unstable angina  (HCC)  Acute on chronic systolic congestive heart failure (HCC)   Intermittent central chest pain concerning for angina. No chest pain currently. EKG without acute ST changes. Septal Q waves.   LHC 06/06/15: 1. 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 Good LV function   D/w Dr. Domenic Polite cardiology who reviewed patient's office notes and catheterization report. States there is no intervention possible for distal LAD lesion. Recommends serial troponins today as long as patient remains pain-free. Can start Imdur 30 mg daily and he will arrange for outpatient follow-up.  Patient very short of breath with ambulation. Oxygen level drops to 87% on room air. Chest x-ray shows interstitial edema and pleural effusions. We'll give IV Lasix. She is not on Lasix at home. Echo last year showed normal EF. Concern for new onset cHF.   Troponin negative x2. EKG unchanged. D-dimer pending.  Dr. Jonnie Finner to admit.  Aware of pending D-dimer though presentation seems more consistent with CHF.  Ezequiel Essex, MD 04/22/16 [phone]

## 2016-04-22 NOTE — ED Notes (Signed)
MD at bedside. 

## 2016-04-22 NOTE — ED Notes (Signed)
Pt c/o cp/pressure and sob upon waking this am. Pt sent by PCP for evaluation. Pt reports taking 2 sublingual nitroglycerin pta.

## 2016-04-23 ENCOUNTER — Inpatient Hospital Stay (HOSPITAL_COMMUNITY): Payer: PPO

## 2016-04-23 DIAGNOSIS — Z8679 Personal history of other diseases of the circulatory system: Secondary | ICD-10-CM

## 2016-04-23 DIAGNOSIS — I25118 Atherosclerotic heart disease of native coronary artery with other forms of angina pectoris: Secondary | ICD-10-CM | POA: Diagnosis not present

## 2016-04-23 DIAGNOSIS — R0600 Dyspnea, unspecified: Secondary | ICD-10-CM

## 2016-04-23 DIAGNOSIS — R0789 Other chest pain: Secondary | ICD-10-CM | POA: Diagnosis not present

## 2016-04-23 DIAGNOSIS — I509 Heart failure, unspecified: Secondary | ICD-10-CM | POA: Diagnosis not present

## 2016-04-23 DIAGNOSIS — I10 Essential (primary) hypertension: Secondary | ICD-10-CM | POA: Diagnosis not present

## 2016-04-23 DIAGNOSIS — I5042 Chronic combined systolic (congestive) and diastolic (congestive) heart failure: Secondary | ICD-10-CM

## 2016-04-23 HISTORY — DX: Personal history of other diseases of the circulatory system: Z86.79

## 2016-04-23 HISTORY — DX: Chronic combined systolic (congestive) and diastolic (congestive) heart failure: I50.42

## 2016-04-23 LAB — ECHOCARDIOGRAM COMPLETE
Height: 65 in
Weight: 2776 oz

## 2016-04-23 LAB — BASIC METABOLIC PANEL
Anion gap: 6 (ref 5–15)
BUN: 18 mg/dL (ref 6–20)
CO2: 28 mmol/L (ref 22–32)
Calcium: 8.2 mg/dL — ABNORMAL LOW (ref 8.9–10.3)
Chloride: 104 mmol/L (ref 101–111)
Creatinine, Ser: 0.9 mg/dL (ref 0.44–1.00)
GFR calc Af Amer: >60 mL/min (ref >60)
GFR calc non Af Amer: >60 mL/min (ref >60)
Glucose, Bld: 96 mg/dL (ref 65–99)
Potassium: 3.7 mmol/L (ref 3.5–5.1)
Sodium: 138 mmol/L (ref 135–145)

## 2016-04-23 MED ORDER — FUROSEMIDE 20 MG PO TABS
20.0000 mg | ORAL_TABLET | Freq: Every day | ORAL | Status: DC
  Administered 2016-04-24: 20 mg via ORAL
  Filled 2016-04-23: qty 1

## 2016-04-23 MED ORDER — FUROSEMIDE 40 MG PO TABS: 40.0000 mg | ORAL_TABLET | Freq: Every day | ORAL | Status: DC | PRN

## 2016-04-23 NOTE — Progress Notes (Signed)
PROGRESS NOTE    Maria Fry  [id number] DOB: 06/14/1951 DOA: 04/22/2016 PCP: Vic Blackbird, MD    Brief Narrative:  53 yof with history of MI/CAD s/p cath July 2016. HTN, HLD, and hypothyroidism presented from PCP's office with complaints of exertional chest pressure and dyspnea. In the ED, CXR revealed edema and effusions consistent with CHF. Started on IV lasix and follow up ECHO. She is diuresing well and anticipate discharge in 24-48 hours.  Assessment & Plan:   Principal Problem:   Congestive heart failure, acute (HCC) Active Problems:   Hypertension   CAD (coronary artery disease)   Chest pressure   DOE (dyspnea on exertion)   Acute congestive heart failure ([place])   1. Acute congestive heart failure, new onset. Last ECHO 2016 showed normal EF. Initially started on IV lasix with good response. Her volume status has improved and shortness of breath/chest pressure has resolved. Noted to be allergic to ACE-I. Continue metoprolol. Echo has been ordered and is pending. She plans to follow up with Dr. Bronson Ing on 5/30.   2. HTN. Continue current medications.  3. Hypothyroidism. Continue synthroid.  4. CAD hx of cath 2016. Stable. Cardiac enzymes negative  DVT prophylaxis: Lovenox Code Status: Full Family Communication: daughter and granddaughter bedside.   Disposition Plan: Anticipate discharge in 1-2 days.   Consultants:   None  Procedures:   Echo pending  Antimicrobials:   None    Subjective: Breathing has improved since admission and chest pressure has resolved. Urinating without difficulty. Has not ambulated much since admission.   Objective: Filed Vitals:   04/22/16 1953 04/22/16 2131 04/23/16 0523 04/23/16 0636  BP:  124/72 100/57   Pulse:  55 64   Temp:  97.6 F (36.4 C) 97.9 F (36.6 C)   TempSrc:  Oral Oral   Resp:   16   Height:      Weight:    78.699 kg (173 lb 8 oz)  SpO2: 96% 99% 98%     Intake/Output Summary (Last 24 hours) at  04/23/16 1043 Last data filed at 04/23/16 0937  Gross per 24 hour  Intake    240 ml  Output   1580 ml  Net  -1340 ml   Filed Weights   04/22/16 1251 04/22/16 1813 04/23/16 0636  Weight: 77.111 kg (170 lb) 78.518 kg (173 lb 1.6 oz) 78.699 kg (173 lb 8 oz)    Examination:  General exam: Appears calm and comfortable  Respiratory system: Clear to auscultation. Respiratory effort normal. Cardiovascular system: S1 & S2 heard, RRR. No JVD, murmurs, rubs, gallops or clicks. No pedal edema. Gastrointestinal system: Abdomen is nondistended, soft and nontender. No organomegaly or masses felt. Normal bowel sounds heard. Central nervous system: Alert and oriented. No focal neurological deficits. Extremities: Symmetric 5 x 5 power. Skin: No rashes, lesions or ulcers Psychiatry: Judgement and insight appear normal. Mood & affect appropriate.     Data Reviewed: I have personally reviewed following labs and imaging studies  CBC:  Recent Labs Lab 04/22/16 1327  WBC 7.9  NEUTROABS 5.4  HGB 12.1  HCT 36.7  MCV 87.8  PLT [id number]   Basic Metabolic Panel:  Recent Labs Lab 04/22/16 1327 04/22/16 1614 04/23/16 0630  NA 136  --  138  K 3.9  --  3.7  CL 104  --  104  CO2 26  --  28  GLUCOSE 102*  --  96  BUN 14  --  18  CREATININE 0.91  --  0.90  CALCIUM 8.7*  --  8.2*  MG  --  2.1  --    Cardiac Enzymes:  Recent Labs Lab 04/22/16 1327 04/22/16 1614  TROPONINI <0.03 <0.03       Component Value Date/Time   COLORURINE YELLOW 01/17/2016 2018   APPEARANCEUR CLEAR 01/17/2016 2018   LABSPEC 1.020 01/17/2016 2018   PHURINE 6.5 01/17/2016 2018   GLUCOSEU NEGATIVE 01/17/2016 2018   HGBUR MODERATE* 01/17/2016 2018   [place] NEGATIVE 01/17/2016 2018   KETONESUR NEGATIVE 01/17/2016 2018   PROTEINUR NEGATIVE 01/17/2016 2018   NITRITE NEGATIVE 01/17/2016 2018   LEUKOCYTESUR NEGATIVE 01/17/2016 2018   Sepsis Labs: @LABRCNTIP (procalcitonin:4,lacticidven:4)  )No results found  for this or any previous visit (from the past 240 hour(s)).       Radiology Studies: Dg Chest 2 View  04/22/2016  CLINICAL DATA:  Chest pain and short of breath EXAM: CHEST  2 VIEW COMPARISON:  01/17/2016 FINDINGS: Patchy bibasilar airspace disease has progressed in the interval. Cardiac enlargement with pulmonary vascular congestion. Small bilateral effusions. Probable mild interstitial edema. IMPRESSION: Mild congestive heart failure with interstitial edema and small pleural effusions Bibasilar atelectasis. Electronically Signed   By: Franchot Gallo M.D.   On: 04/22/2016 13:16        Scheduled Meds: . aspirin  81 mg Oral Daily  . atorvastatin  80 mg Oral q1800  . clopidogrel  75 mg Oral Daily  . enoxaparin (LOVENOX) injection  40 mg Subcutaneous Q24H  . furosemide  20 mg Intravenous Q12H  . gabapentin  300 mg Oral QHS  . isosorbide-hydrALAZINE  1 tablet Oral TID  . levothyroxine  88 mcg Oral QAC breakfast  . metoprolol tartrate  37.5 mg Oral BID  . potassium chloride  20 mEq Oral BID  . sodium chloride flush  3 mL Intravenous Q12H   Continuous Infusions:    LOS: 1 day    Time spent: 20 Minutes   Kathie Dike, MD  [facility] Pager [phone]  If 7PM-7AM, please contact night-coverage [email or web address] Password [email or web address] 04/23/2016, 10:43 AM     By signing my name below, I, Rennis Harding, attest that this documentation has been prepared under the direction and in the presence of Kathie Dike, MD. Electronically signed: Rennis Harding, Scribe. 04/23/2016 2:05pm   I, Dr. Kathie Dike, personally performed the services described in this documentaiton. All medical record entries made by the scribe were at my direction and in my presence. I have reviewed the chart and agree that the record reflects my personal performance and is accurate and complete  Kathie Dike, MD, 04/23/2016 2:39 PM

## 2016-04-23 NOTE — Consult Note (Signed)
   [facility] CM Inpatient Consult   04/23/2016  CANADA ORCUTT [birth year] [id number]  Spoke with patient and her friend at bedside regarding [facility] services. Patient does not want to participate with [facility] at this time. Patient given [facility] brochure and contact information for future reference.  Of note, [facility] Care Management services would not replace or interfere with any services that are arranged by inpatient case management or social work. For additional questions or referrals please contact:  Royetta Crochet. Laymond Purser, RN, BSN, [facility] Liaison [phone]

## 2016-04-23 NOTE — Care Management Note (Signed)
Case Management Note  Patient Details  Name: Maria Fry MRN: [id number] Date of Birth: 1951-07-05  Subjective/Objective: Spoke with patient who is from home with boyfriend. Patient states that she considers herself independent. Uses a cane to ambulate and drives self. Denies issues with filling medications.  PCP is Dr Lorrene Reid.   Not on home O2. No CM needs identified.                   Action/Plan: Home with Self care.   Expected Discharge Date:                  Expected Discharge Plan:  Home/Self Care  In-House Referral:     Discharge planning Services  CM Consult  Post Acute Care Choice:    Choice offered to:     DME Arranged:    DME Agency:     HH Arranged:    [place] Agency:     Status of Service:  Completed, signed off  Medicare Important Message Given:    Date Medicare IM Given:    Medicare IM give by:    Date Additional Medicare IM Given:    Additional Medicare Important Message give by:     If discussed at [place] of Stay Meetings, dates discussed:    Additional Comments:  Alvie Heidelberg, RN 04/23/2016, 2:21 PM

## 2016-04-24 ENCOUNTER — Encounter (HOSPITAL_COMMUNITY): Payer: Self-pay | Admitting: Cardiology

## 2016-04-24 DIAGNOSIS — I25118 Atherosclerotic heart disease of native coronary artery with other forms of angina pectoris: Secondary | ICD-10-CM | POA: Diagnosis not present

## 2016-04-24 DIAGNOSIS — I25110 Atherosclerotic heart disease of native coronary artery with unstable angina pectoris: Secondary | ICD-10-CM

## 2016-04-24 DIAGNOSIS — I5023 Acute on chronic systolic (congestive) heart failure: Secondary | ICD-10-CM | POA: Diagnosis not present

## 2016-04-24 DIAGNOSIS — I10 Essential (primary) hypertension: Secondary | ICD-10-CM

## 2016-04-24 DIAGNOSIS — R0609 Other forms of dyspnea: Secondary | ICD-10-CM | POA: Diagnosis not present

## 2016-04-24 DIAGNOSIS — I5041 Acute combined systolic (congestive) and diastolic (congestive) heart failure: Secondary | ICD-10-CM

## 2016-04-24 DIAGNOSIS — R0789 Other chest pain: Secondary | ICD-10-CM | POA: Diagnosis not present

## 2016-04-24 DIAGNOSIS — I5043 Acute on chronic combined systolic (congestive) and diastolic (congestive) heart failure: Secondary | ICD-10-CM

## 2016-04-24 DIAGNOSIS — I5021 Acute systolic (congestive) heart failure: Secondary | ICD-10-CM | POA: Diagnosis not present

## 2016-04-24 LAB — BASIC METABOLIC PANEL
Anion gap: 5 (ref 5–15)
BUN: 16 mg/dL (ref 6–20)
CO2: 26 mmol/L (ref 22–32)
Calcium: 7.9 mg/dL — ABNORMAL LOW (ref 8.9–10.3)
Chloride: 104 mmol/L (ref 101–111)
Creatinine, Ser: 0.85 mg/dL (ref 0.44–1.00)
GFR calc Af Amer: >60 mL/min (ref >60)
GFR calc non Af Amer: >60 mL/min (ref >60)
Glucose, Bld: 94 mg/dL (ref 65–99)
Potassium: 3.6 mmol/L (ref 3.5–5.1)
Sodium: 135 mmol/L (ref 135–145)

## 2016-04-24 MED ORDER — HYDROCODONE-ACETAMINOPHEN 5-325 MG PO TABS
1.0000 | ORAL_TABLET | ORAL | Status: DC | PRN
  Administered 2016-04-24: 1 via ORAL
  Filled 2016-04-24 (×2): qty 1

## 2016-04-24 MED ORDER — FUROSEMIDE 40 MG PO TABS
40.0000 mg | ORAL_TABLET | Freq: Every day | ORAL | Status: DC
  Administered 2016-04-25: 40 mg via ORAL
  Filled 2016-04-24: qty 1

## 2016-04-24 MED ORDER — RANOLAZINE ER 500 MG PO TB12
500.0000 mg | ORAL_TABLET | Freq: Two times a day (BID) | ORAL | Status: DC
  Administered 2016-04-24 – 2016-04-25 (×2): 500 mg via ORAL
  Filled 2016-04-24 (×2): qty 1

## 2016-04-24 NOTE — Progress Notes (Signed)
PROGRESS NOTE    Maria Fry  [id number] DOB: February 05, 1951 DOA: 04/22/2016 PCP: Vic Blackbird, MD    Brief Narrative:  17 yof with history of MI/CAD s/p cath July 2016. HTN, HLD, and hypothyroidism presented from PCP's office with complaints of exertional chest pressure and dyspnea. In the ED, CXR revealed edema and effusions consistent with CHF. Started on IV lasix and follow up ECHO. She is diuresing well and anticipate discharge in 24-48 hours.  Assessment & Plan:   Principal Problem:   Acute systolic congestive heart failure (HCC) Active Problems:   Hypertension   CAD (coronary artery disease)   Chest pressure   DOE (dyspnea on exertion)   Acute systolic congestive heart failure, new onset.  Last ECHO 2016 showed normal EF. Initially started on IV lasix with good response. Her volume status has improved and shortness of breath/chest pressure has resolved. Noted to be allergic to ACE-I. Continue metoprolol. BiDil was started and then stopped secondary to her complaints of headache. Echo revealed an EF of 45% and grade 2 diastolic dysfunction and with wall motion abnormalities.. Cardiology consulted. -Medication regimen adjusted by cardiology. If her symptoms are not controlled, cardiology is considering a repeat catheterization.  Coronary artery disease with history of cardiac catheterization 2016. Patient is treated chronically with aspirin, Lipitor, Lopressor, and as needed nitroglycerin. Her cardiac enzymes were negative. Her d-dimer was slightly elevated, but there is no high suspicion for PE. As above, cardiology was consulted. BiDil was discontinued due to her headaches. Ranexa was started instead. If patient's symptoms are not improved, cardiology is contemplating a possibility of repeat cardiac catheterization.  HTN. Currently stable and controlled on  current medications.   Hypothyroidism. She was continued on synthroid. Her TSH was within normal limits at 1.742 months  ago.  Global headache. Likely secondary to BiDil. BiDil was discontinued. Vicodin was added as needed for headache.  DVT prophylaxis: Lovenox Code Status: Full Family Communication: daughter and granddaughter bedside.   Disposition Plan: Anticipate discharge in 1-2 days.   Consultants:   None  Procedures:  Echo 04/23/16 Study Conclusions - Left ventricle: Systolic function was mildly reduced, EF 45%. No  significant change from echo dated 06/06/15. The cavity size was  normal. There was moderate concentric hypertrophy. Features are  consistent with a pseudonormal left ventricular filling pattern,  with concomitant abnormal relaxation and increased filling  pressure (grade 2 diastolic dysfunction). - Regional wall motion abnormality: Mild hypokinesis of the mid  anterior, mid anteroseptal, apical lateral, and apical  myocardium. - Aortic valve: Mildly to moderately calcified annulus. Trileaflet.  There was mild regurgitation. - Mitral valve: There was moderate eccentric regurgitation. - Left atrium: The atrium was severely dilated. - Tricuspid valve: There was mild regurgitation.  Antimicrobials:   None    Subjective: Breathing has improved since admission and chest pressure has resolved. Urinating without difficulty. Has not ambulated much since admission.   Objective: Filed Vitals:   04/23/16 2012 04/24/16 0345 04/24/16 0949 04/24/16 1459  BP: 123/69 102/48 117/61 121/68  Pulse: 64 61 65 70  Temp: 97.8 F (36.6 C) 97.8 F (36.6 C)  97.8 F (36.6 C)  TempSrc: Oral Oral  Oral  Resp: 20 19  18   Height:      Weight:  78.971 kg (174 lb 1.6 oz)    SpO2: 98% 98% 98% 99%    Intake/Output Summary (Last 24 hours) at 04/24/16 1851 Last data filed at 04/24/16 1758  Gross per 24 hour  Intake  363 ml  Output   1800 ml  Net  -1437 ml   Filed Weights   04/22/16 1813 04/23/16 0636 04/24/16 0345  Weight: 78.518 kg (173 lb 1.6 oz) 78.699 kg (173 lb 8 oz) 78.971  kg (174 lb 1.6 oz)    Examination:  General exam: Appears distressed due to headache.  Respiratory system: Clear to auscultation. Respiratory effort normal. Cardiovascular system: S1 & S2 heard, RRR. No JVD, murmurs, rubs, gallops or clicks. No pedal edema. Gastrointestinal system: Abdomen is nondistended, soft and nontender. No organomegaly or masses felt. Normal bowel sounds heard. Central nervous system: Alert and oriented. No focal neurological deficits. Extremities: Symmetric 5 x 5 power. Skin: No rashes, lesions or ulcers Psychiatry: She appears to be stressed. Speech is clear.     Data Reviewed: I have personally reviewed following labs and imaging studies  CBC:  Recent Labs Lab 04/22/16 1327  WBC 7.9  NEUTROABS 5.4  HGB 12.1  HCT 36.7  MCV 87.8  PLT [id number]   Basic Metabolic Panel:  Recent Labs Lab 04/22/16 1327 04/22/16 1614 04/23/16 0630 04/24/16 0451  NA 136  --  138 135  K 3.9  --  3.7 3.6  CL 104  --  104 104  CO2 26  --  28 26  GLUCOSE 102*  --  96 94  BUN 14  --  18 16  CREATININE 0.91  --  0.90 0.85  CALCIUM 8.7*  --  8.2* 7.9*  MG  --  2.1  --   --    Cardiac Enzymes:  Recent Labs Lab 04/22/16 1327 04/22/16 1614  TROPONINI <0.03 <0.03       Component Value Date/Time   COLORURINE YELLOW 01/17/2016 2018   APPEARANCEUR CLEAR 01/17/2016 2018   LABSPEC 1.020 01/17/2016 2018   PHURINE 6.5 01/17/2016 2018   [place] NEGATIVE 01/17/2016 2018   HGBUR MODERATE* 01/17/2016 2018   [place] NEGATIVE 01/17/2016 2018   [place] NEGATIVE 01/17/2016 2018   PROTEINUR NEGATIVE 01/17/2016 2018   NITRITE NEGATIVE 01/17/2016 2018   LEUKOCYTESUR NEGATIVE 01/17/2016 2018   Sepsis Labs: @LABRCNTIP (procalcitonin:4,lacticidven:4)  )No results found for this or any previous visit (from the past 240 hour(s)).       Radiology Studies: No results found.      Scheduled Meds: . aspirin  81 mg Oral Daily  . atorvastatin  80 mg Oral q1800  .  clopidogrel  75 mg Oral Daily  . enoxaparin (LOVENOX) injection  40 mg Subcutaneous Q24H  . [START ON 04/25/2016] furosemide  40 mg Oral Daily  . gabapentin  300 mg Oral QHS  . levothyroxine  88 mcg Oral QAC breakfast  . metoprolol tartrate  37.5 mg Oral BID  . potassium chloride  20 mEq Oral BID  . ranolazine  500 mg Oral BID  . sodium chloride flush  3 mL Intravenous Q12H   Continuous Infusions:    LOS: 2 days    Time spent: 46 Minutes   Rexene Alberts, MD  [facility] Pager [phone]  If 7PM-7AM, please contact night-coverage [email or web address] Password [facility] 04/24/2016, 6:51 PM

## 2016-04-24 NOTE — Consult Note (Signed)
Reason for Consult: Chest pain Referring Physician: Dr.Fisher Cardiologist: Dr. Bronson Ing Consulting Cardiologist: Dr. Leron Croak is an 65 y.o. female with history of CAD, cath 06/2015 with 100% distal LAD, 10% mid LAD, norrmal LV Function. Also has HTN, HLD, hypothyroidism. Echo 7/5 mild LV dilatation EF 50%, DD mild AI, mod MR. Angioedema from lisinopil.  She now complains of several week history of worsening chest pressure at rest. Eases with NTG but doesn't go totally away. Complains of dyspnea on exertion but no exertional chest pain. Was on Ranexa in past but stopped secondary to cost and control of angina. BNP 1527, troponins negative.  Past Medical History  Diagnosis Date  . Essential hypertension   . Hip pain   . Hypothyroidism   . CAD (coronary artery disease)     a. cath 06/07/15 Dist LAD 100%, mid LAD 10%.  . Hyperlipidemia   . Vitamin D deficiency     Past Surgical History  Procedure Laterality Date  . Tubal ligation    . Abdominal hysterectomy    . Cholecystectomy    . Shoulder arthroscopy    . Heel spurs    . Cardiac catheterization N/A 06/06/2015    Procedure: Left Heart Cath and Coronary Angiography;  Surgeon: Peter M Martinique, MD;  Location: [facility];  Service: Cardiovascular;  Laterality: N/A;    Family History  Problem Relation Age of Onset  . Heart failure Father     Deceased  . Stroke Sister     Deceased  . Heart failure Brother     Deceased  . Cancer Brother     Deceased  . Heart attack Father     Deceased  . Heart attack Brother     Deceased    Social History:  reports that she quit smoking about 13 years ago. Her smoking use included Cigarettes. She started smoking about 38 years ago. She has never used smokeless tobacco. She reports that she does not drink alcohol or use illicit drugs.  Allergies:  Allergies  Allergen Reactions  . Demerol [Meperidine] Nausea And Vomiting  . Lisinopril     Throat swollen  . Lodine  [Etodolac] Rash    Medications:  Scheduled Meds: . aspirin  81 mg Oral Daily  . atorvastatin  80 mg Oral q1800  . clopidogrel  75 mg Oral Daily  . enoxaparin (LOVENOX) injection  40 mg Subcutaneous Q24H  . [START ON 04/25/2016] furosemide  40 mg Oral Daily  . gabapentin  300 mg Oral QHS  . levothyroxine  88 mcg Oral QAC breakfast  . metoprolol tartrate  37.5 mg Oral BID  . potassium chloride  20 mEq Oral BID  . ranolazine  500 mg Oral BID  . sodium chloride flush  3 mL Intravenous Q12H   Continuous Infusions:  PRN Meds:.sodium chloride, acetaminophen, HYDROcodone-acetaminophen, ondansetron (ZOFRAN) IV, sodium chloride flush, zolpidem   Results for orders placed or performed during the hospital encounter of 04/22/16 (from the past 48 hour(s))  CBC with Differential     Status: None   Collection Time: 04/22/16  1:27 PM  Result Value Ref Range   WBC 7.9 4.0 - 10.5 K/uL   RBC 4.18 3.87 - 5.11 MIL/uL   Hemoglobin 12.1 12.0 - 15.0 g/dL   HCT 36.7 36.0 - 46.0 %   MCV 87.8 78.0 - 100.0 fL   MCH 28.9 26.0 - 34.0 pg   MCHC 33.0 30.0 - 36.0 g/dL  RDW 15.4 11.5 - 15.5 %   Platelets 287 150 - 400 K/uL   Neutrophils Relative % 69 %   Neutro Abs 5.4 1.7 - 7.7 K/uL   Lymphocytes Relative 26 %   Lymphs Abs 2.1 0.7 - 4.0 K/uL   Monocytes Relative 4 %   Monocytes Absolute 0.3 0.1 - 1.0 K/uL   Eosinophils Relative 1 %   Eosinophils Absolute 0.1 0.0 - 0.7 K/uL   Basophils Relative 0 %   Basophils Absolute 0.0 0.0 - 0.1 K/uL  Basic metabolic panel     Status: Abnormal   Collection Time: 04/22/16  1:27 PM  Result Value Ref Range   Sodium 136 135 - 145 mmol/L   Potassium 3.9 3.5 - 5.1 mmol/L   Chloride 104 101 - 111 mmol/L   CO2 26 22 - 32 mmol/L   Glucose, Bld 102 (H) 65 - 99 mg/dL   BUN 14 6 - 20 mg/dL   Creatinine, Ser 0.91 0.44 - 1.00 mg/dL   Calcium 8.7 (L) 8.9 - 10.3 mg/dL   GFR calc non Af Amer >60 >60 mL/min   GFR calc Af Amer >60 >60 mL/min    Comment: (NOTE) The eGFR has  been calculated using the CKD EPI equation. This calculation has not been validated in all clinical situations. eGFR's persistently <60 mL/min signify possible Chronic Kidney Disease.    Anion gap 6 5 - 15  Troponin I     Status: None   Collection Time: 04/22/16  1:27 PM  Result Value Ref Range   Troponin I <0.03 <0.031 ng/mL    Comment:        NO INDICATION OF MYOCARDIAL INJURY.   Brain natriuretic peptide     Status: Abnormal   Collection Time: 04/22/16  1:27 PM  Result Value Ref Range   B Natriuretic Peptide 1527.0 (H) 0.0 - 100.0 pg/mL  D-dimer, quantitative (not at [facility])     Status: Abnormal   Collection Time: 04/22/16  1:27 PM  Result Value Ref Range   D-Dimer, Quant 1.83 (H) 0.00 - 0.50 ug/mL-FEU    Comment: (NOTE) At the manufacturer cut-off of 0.50 ug/mL FEU, this assay has been documented to exclude PE with a sensitivity and negative predictive value of 97 to 99%.  At this time, this assay has not been approved by the FDA to exclude DVT/VTE. Results should be correlated with clinical presentation.   Troponin I     Status: None   Collection Time: 04/22/16  4:14 PM  Result Value Ref Range   Troponin I <0.03 <0.031 ng/mL    Comment:        NO INDICATION OF MYOCARDIAL INJURY.   Magnesium     Status: None   Collection Time: 04/22/16  4:14 PM  Result Value Ref Range   Magnesium 2.1 1.7 - 2.4 mg/dL  Basic metabolic panel     Status: Abnormal   Collection Time: 04/23/16  6:30 AM  Result Value Ref Range   Sodium 138 135 - 145 mmol/L   Potassium 3.7 3.5 - 5.1 mmol/L   Chloride 104 101 - 111 mmol/L   CO2 28 22 - 32 mmol/L   Glucose, Bld 96 65 - 99 mg/dL   BUN 18 6 - 20 mg/dL   Creatinine, Ser 0.90 0.44 - 1.00 mg/dL   Calcium 8.2 (L) 8.9 - 10.3 mg/dL   GFR calc non Af Amer >60 >60 mL/min   GFR calc Af Amer >60 >60 mL/min  Comment: (NOTE) The eGFR has been calculated using the CKD EPI equation. This calculation has not been validated in all clinical  situations. eGFR's persistently <60 mL/min signify possible Chronic Kidney Disease.    Anion gap 6 5 - 15  Basic metabolic panel     Status: Abnormal   Collection Time: 04/24/16  4:51 AM  Result Value Ref Range   Sodium 135 135 - 145 mmol/L   Potassium 3.6 3.5 - 5.1 mmol/L   Chloride 104 101 - 111 mmol/L   CO2 26 22 - 32 mmol/L   Glucose, Bld 94 65 - 99 mg/dL   BUN 16 6 - 20 mg/dL   Creatinine, Ser 0.85 0.44 - 1.00 mg/dL   Calcium 7.9 (L) 8.9 - 10.3 mg/dL   GFR calc non Af Amer >60 >60 mL/min   GFR calc Af Amer >60 >60 mL/min    Comment: (NOTE) The eGFR has been calculated using the CKD EPI equation. This calculation has not been validated in all clinical situations. eGFR's persistently <60 mL/min signify possible Chronic Kidney Disease.    Anion gap 5 5 - 15    Dg Chest 2 View  04/22/2016  CLINICAL DATA:  Chest pain and short of breath EXAM: CHEST  2 VIEW COMPARISON:  01/17/2016 FINDINGS: Patchy bibasilar airspace disease has progressed in the interval. Cardiac enlargement with pulmonary vascular congestion. Small bilateral effusions. Probable mild interstitial edema. IMPRESSION: Mild congestive heart failure with interstitial edema and small pleural effusions Bibasilar atelectasis. Electronically Signed   By: Franchot Gallo M.D.   On: 04/22/2016 13:16    Review of Systems  Constitutional: Positive for malaise/fatigue.  Eyes: Negative.   Respiratory: Positive for cough, shortness of breath and wheezing.   Cardiovascular: Positive for chest pain and PND.  Gastrointestinal: Negative.   Genitourinary: Negative.   Musculoskeletal: Negative.   Neurological: Positive for headaches.  Endo/Heme/Allergies: Negative.   All other systems reviewed and are negative.  Blood pressure 117/61, pulse 65, temperature 97.8 F (36.6 C), temperature source Oral, resp. rate 19, height 5' 5"  (1.651 m), weight 174 lb 1.6 oz (78.971 kg), SpO2 98 %. Physical Exam PHYSICAL EXAM: Well-nournished,  in no acute distress. Neck: No JVD, HJR, Bruit, or thyroid enlargement Lungs: No tachypnea, clear without wheezing, rales, or rhonchi Cardiovascular: RRR, PMI not displaced, heart sounds GOTLXB,2/6 systolic murmur LSB,no gallops, bruit, thrill, or heave. Abdomen: BS normal. Soft without organomegaly, masses, lesions or tenderness. Extremities: without cyanosis, clubbing or edema. Good distal pulses bilatera SKin: Warm, no lesions or rashes  Musculoskeletal: No deformities Neuro: no focal signs  Echo 04/23/16 Study Conclusions   - Left ventricle: Systolic function was mildly reduced, EF 45%. No   significant change from echo dated 06/06/15. The cavity size was   normal. There was moderate concentric hypertrophy. Features are   consistent with a pseudonormal left ventricular filling pattern,   with concomitant abnormal relaxation and increased filling   pressure (grade 2 diastolic dysfunction). - Regional wall motion abnormality: Mild hypokinesis of the mid   anterior, mid anteroseptal, apical lateral, and apical   myocardium. - Aortic valve: Mildly to moderately calcified annulus. Trileaflet.   There was mild regurgitation. - Mitral valve: There was moderate eccentric regurgitation. - Left atrium: The atrium was severely dilated. - Tricuspid valve: There was mild regurgitation.   Cath 06/2015 Conclusion      Dist LAD lesion, 100% stenosed.  Mid LAD lesion, 10% stenosed.  The left ventricular systolic function is normal.  1. 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 Negative 2400 cc since admission on po Lasix 40 mg. Continue to diurerse.  Accelerating angina in the setting of CHF. Ranexa added today because of terrible headache on Bidil. Also on metoprolol, plavix, lipitor, ASA . No evidence of ACS by enzymes.  CAD cath 06/2015 100% distal LAD, 10% mid  LAD  Angioedema from Lisinopril  HTN controlled  Ermalinda Barrios PA-C  04/24/2016, 11:07 AM   Attending note:  Patient seen and examined. Reviewed records and updated the chart. Case discussed with Ms. Bonnell Public PA-C. Ms. Terada is a patient of Dr. Bronson Ing with a history of CAD including distal LAD occlusion that was managed medically in July 2016, LVEF 50% at that time, and otherwise no obstructive coronary stenoses. She presents to the hospital describing worsening dyspnea on exertion and increasing angina symptoms on her outpatient regimen which included aspirin, Lipitor, Lopressor, and as needed nitroglycerin. She had been seen recently in the office with discussion about trying Ranexa. She has ruled out for ACS with normal troponin I levels. ECG overall nonspecific. BNP however elevated at 1527 with chest x-ray consistent with mild interstitial edema and small pleural effusions suggesting decompensated combined heart failure. D-dimer noted to be 1.8, but no other high suspicion for pulmonary embolus.  On examination she complains of significant headache on BiDil which is a new medication for her. Heart rate in the 56Y and systolic blood pressure 563 to 115 range. Lungs are clear without labored breathing. Cardiac exam reveals RRR without gallop. Follow-up echocardiogram reveals LVEF approximately 45% with grade 2 diastolic dysfunction and wall motion abnormalities consistent with ischemic heart disease. Also moderate, eccentric mitral regurgitation.  Discussed with patient and significant other in the room, subsequently Dr. Caryn Section. Would recommend stopping BiDil, we will try and initiate Ranexa later today for angina control. Otherwise agree with Lasix for diuresis and continuing her remaining cardiac regimen. We will reassess symptom control tomorrow. I did discuss the possibility of a repeat cardiac catheterization with her depending on her clinical progress.  Satira Sark, M.D.,  F.A.C.C.

## 2016-04-25 ENCOUNTER — Encounter (HOSPITAL_COMMUNITY): Payer: Self-pay | Admitting: Internal Medicine

## 2016-04-25 DIAGNOSIS — E559 Vitamin D deficiency, unspecified: Secondary | ICD-10-CM | POA: Diagnosis not present

## 2016-04-25 DIAGNOSIS — I5023 Acute on chronic systolic (congestive) heart failure: Secondary | ICD-10-CM | POA: Diagnosis not present

## 2016-04-25 DIAGNOSIS — I5043 Acute on chronic combined systolic (congestive) and diastolic (congestive) heart failure: Secondary | ICD-10-CM | POA: Diagnosis not present

## 2016-04-25 DIAGNOSIS — R0789 Other chest pain: Secondary | ICD-10-CM | POA: Diagnosis not present

## 2016-04-25 DIAGNOSIS — I10 Essential (primary) hypertension: Secondary | ICD-10-CM | POA: Diagnosis not present

## 2016-04-25 DIAGNOSIS — I5041 Acute combined systolic (congestive) and diastolic (congestive) heart failure: Secondary | ICD-10-CM | POA: Diagnosis not present

## 2016-04-25 DIAGNOSIS — I25118 Atherosclerotic heart disease of native coronary artery with other forms of angina pectoris: Secondary | ICD-10-CM

## 2016-04-25 DIAGNOSIS — I110 Hypertensive heart disease with heart failure: Secondary | ICD-10-CM | POA: Diagnosis not present

## 2016-04-25 DIAGNOSIS — R0609 Other forms of dyspnea: Secondary | ICD-10-CM | POA: Diagnosis not present

## 2016-04-25 DIAGNOSIS — I5021 Acute systolic (congestive) heart failure: Secondary | ICD-10-CM

## 2016-04-25 DIAGNOSIS — I25110 Atherosclerotic heart disease of native coronary artery with unstable angina pectoris: Secondary | ICD-10-CM | POA: Diagnosis not present

## 2016-04-25 LAB — BASIC METABOLIC PANEL
Anion gap: 4 — ABNORMAL LOW (ref 5–15)
BUN: 10 mg/dL (ref 6–20)
CO2: 27 mmol/L (ref 22–32)
Calcium: 8.1 mg/dL — ABNORMAL LOW (ref 8.9–10.3)
Chloride: 105 mmol/L (ref 101–111)
Creatinine, Ser: 0.71 mg/dL (ref 0.44–1.00)
GFR calc Af Amer: >60 mL/min (ref >60)
GFR calc non Af Amer: >60 mL/min (ref >60)
Glucose, Bld: 85 mg/dL (ref 65–99)
Potassium: 3.8 mmol/L (ref 3.5–5.1)
Sodium: 136 mmol/L (ref 135–145)

## 2016-04-25 MED ORDER — POTASSIUM CHLORIDE CRYS ER 15 MEQ PO TBCR: 15.0000 meq | EXTENDED_RELEASE_TABLET | Freq: Two times a day (BID) | ORAL | Status: DC

## 2016-04-25 MED ORDER — METOPROLOL TARTRATE 25 MG PO TABS: 37.5000 mg | ORAL_TABLET | Freq: Two times a day (BID) | ORAL | Status: DC

## 2016-04-25 MED ORDER — HYDROCODONE-ACETAMINOPHEN 5-325 MG PO TABS: 1.0000 | ORAL_TABLET | ORAL | Status: DC | PRN

## 2016-04-25 MED ORDER — RANOLAZINE ER 500 MG PO TB12: 500.0000 mg | ORAL_TABLET | Freq: Two times a day (BID) | ORAL | Status: DC

## 2016-04-25 MED ORDER — FUROSEMIDE 40 MG PO TABS: 40.0000 mg | ORAL_TABLET | Freq: Every day | ORAL | Status: DC

## 2016-04-25 NOTE — Discharge Summary (Signed)
Physician Discharge Summary  Maria Fry [id number] DOB: 11-14-1951 DOA: 04/22/2016  PCP: Vic Blackbird, MD  Admit date: 04/22/2016 Discharge date: 04/25/2016  Time spent: Greater than 30 minutes  Recommendations for Outpatient Follow-up:  1. Patient will follow-up with her cardiologist Dr. Bronson Ing as scheduled on 04/30/16.   Discharge Diagnoses:  1. Acute on chronic combined systolic and diastolic congestive heart failure. -Echo revealed EF of 45% and grade 2 diastolic dysfunction. -I/O was -3.5+ liters. 2. Coronary artery disease per cardiac catheterization 06/2015, revealing 100% distal LAD, 10% mid LAD, and normal LV function. 3. Essential hypertension. 4. Hypothyroidism. 5. Global headache secondary to BiDil. 6. Chest pressure and dyspnea on exertion secondary to #1. Resolved.  Discharge Condition: Improved.  Diet recommendation: Heart healthy.  Filed Weights   04/23/16 0636 04/24/16 0345 04/25/16 0656  Weight: 78.699 kg (173 lb 8 oz) 78.971 kg (174 lb 1.6 oz) 78.82 kg (173 lb 12.3 oz)    History of present illness:  The patient is a 65 year old woman with a history of myocardial infarction and coronary artery disease, status post cardiac catheterization 06/2015, hypertension, hypothyroidism, and hyperlipidemia, who presented from her PCPs office with complaints of exertional chest pressure and dyspnea on exertion. In the ED, her chest x-ray revealed edema and pleural effusions consistent with CHF. Her BNP was elevated at 1527. She was admitted for further evaluation and management.  Hospital Course:   Acute systolic and diastolic congestive heart failure, new onset.  The patient was started on IV Lasix. Her ins and outs were monitored closely. She was restarted on metoprolol. Another 2-D echocardiogram was ordered for further evaluation. It was noted that her last echo 06/2015 revealed an EF of 50-55%. The Echo revealed an EF of 45% and grade 2 diastolic dysfunction and  with wall motion abnormalities. Cardiology was consulted. They agreed with medical management. It was noted that the patient was allergic to ace inhibitors, so ACE-I was not started. Dr. Domenic Polite started Bidil instead. However, the patient developed a severe headache, so it was discontinued. Ranexa was started to help control her anginal symptoms. If her symptoms were not controlled on the adjusted medication regimen cardiology was considering a repeat cardiac catheterization. -The patient's dyspnea on exertion and chest pressure resolved. She improved clinically and symptomatically. She diuresed over 3 L. IV Lasix was transitioned to oral Lasix 40 mg daily. -She was discharged in improved condition. She will follow-up with her personal cardiologist as scheduled on 04/30/16.  Coronary artery disease with history of cardiac catheterization 2016. Patient is treated chronically with aspirin, Lipitor, Lopressor, and as needed nitroglycerin-all were restarted. Her cardiac enzymes were negative. Her d-dimer was slightly elevated, but there was no high suspicion for PE. As above, cardiology was consulted. BiDil was discontinued due to her headaches. Ranexa was started instead. Her chest pressure resolved.  HTN. Her blood pressure remained stable and controlled on her chronic medications.   Hypothyroidism. She was continued on synthroid. Her TSH was within normal limits at 1.74 two months ago.  Global headache. Likely secondary to BiDil. BiDil was discontinued. There were no neurological deficits or suspicious findings on exam. Vicodin was added as needed for headache. Her headache resolved.    Procedures: Echo 04/23/16 Study Conclusions - Left ventricle: Systolic function was mildly reduced, EF 45%. No  significant change from echo dated 06/06/15. The cavity size was  normal. There was moderate concentric hypertrophy. Features are  consistent with a pseudonormal left ventricular filling pattern,   with concomitant  abnormal relaxation and increased filling  pressure (grade 2 diastolic dysfunction). - Regional wall motion abnormality: Mild hypokinesis of the mid  anterior, mid anteroseptal, apical lateral, and apical  myocardium. - Aortic valve: Mildly to moderately calcified annulus. Trileaflet.  There was mild regurgitation. - Mitral valve: There was moderate eccentric regurgitation. - Left atrium: The atrium was severely dilated. - Tricuspid valve: There was mild regurgitation.  Consultations:  Cardiology  Discharge Exam: Filed Vitals:   04/24/16 2145 04/25/16 0656  BP: 129/67 130/64  Pulse: 62 60  Temp: 97.9 F (36.6 C) 98.2 F (36.8 C)  Resp: 20 20  Oxygen saturation 100% on room air.  General: Pleasant 65 year old African-American woman in no acute distress. Cardiovascular: S1, S2, with a soft systolic murmur. Respiratory: Clear to auscultation bilaterally.  Discharge Instructions   Discharge Instructions    Diet - low sodium heart healthy    Complete by:  As directed      Increase activity slowly    Complete by:  As directed           Current Discharge Medication List    START taking these medications   Details  furosemide (LASIX) 40 MG tablet Take 1 tablet (40 mg total) by mouth daily. Qty: 30 tablet, Refills: 3    HYDROcodone-acetaminophen (NORCO/VICODIN) 5-325 MG tablet Take 1 tablet by mouth every 4 (four) hours as needed for moderate pain. Qty: 15 tablet, Refills: 0    potassium chloride SA (KLOR-CON M15) 15 MEQ tablet Take 1 tablet (15 mEq total) by mouth 2 (two) times daily. Qty: 60 tablet, Refills: 3    ranolazine (RANEXA) 500 MG 12 hr tablet Take 1 tablet (500 mg total) by mouth 2 (two) times daily. (6 week supply of sample given)      CONTINUE these medications which have CHANGED   Details  metoprolol tartrate (LOPRESSOR) 25 MG tablet Take 1.5 tablets (37.5 mg total) by mouth 2 (two) times daily. Take 1 1/2 (total 37.5mg ) table by  mouth in morning and evening.      CONTINUE these medications which have NOT CHANGED   Details  acetaminophen (TYLENOL) 500 MG tablet Take 500 mg by mouth every 6 (six) hours as needed for mild pain or moderate pain. Reported on 04/22/2016    aspirin 81 MG chewable tablet Chew 1 tablet (81 mg total) by mouth daily.    atorvastatin (LIPITOR) 80 MG tablet Take 1 tablet (80 mg total) by mouth daily at 6 PM. Qty: 30 tablet, Refills: 12    clopidogrel (PLAVIX) 75 MG tablet Take 1 tablet (75 mg total) by mouth daily. Qty: 30 tablet, Refills: 6    gabapentin (NEURONTIN) 300 MG capsule Take 300 mg by mouth at bedtime.     levothyroxine (SYNTHROID) 88 MCG tablet Take 1 tablet (88 mcg total) by mouth daily before breakfast. Qty: 30 tablet, Refills: 3    nitroGLYCERIN (NITROSTAT) 0.4 MG SL tablet Place 1 tablet (0.4 mg total) under the tongue every 5 (five) minutes as needed for chest pain. Qty: 25 tablet, Refills: 3    Vitamin D, Ergocalciferol, (DRISDOL) 50000 units CAPS capsule Take 50,000 Units by mouth every 7 (seven) days. Takes on Wednesday's.      STOP taking these medications     Aspirin-Acetaminophen-Caffeine (GOODY HEADACHE PO)        Allergies  Allergen Reactions  . Vicodin [Hydrocodone-Acetaminophen]     Severe nausea and diarrhea   . Demerol [Meperidine] Nausea And Vomiting  . Lisinopril  Throat swollen  . Lodine [Etodolac] Rash   Follow-up Information    Follow up with Kate Sable, MD On 04/30/2016.   Specialty:  Cardiology   Why:  Keep appointment scheduled for 9am   Contact information:   [address] [id number] [phone]        The results of significant diagnostics from this hospitalization (including imaging, microbiology, ancillary and laboratory) are listed below for reference.    Significant Diagnostic Studies: Dg Chest 2 View  04/22/2016  CLINICAL DATA:  Chest pain and short of breath EXAM: CHEST  2 VIEW COMPARISON:   01/17/2016 FINDINGS: Patchy bibasilar airspace disease has progressed in the interval. Cardiac enlargement with pulmonary vascular congestion. Small bilateral effusions. Probable mild interstitial edema. IMPRESSION: Mild congestive heart failure with interstitial edema and small pleural effusions Bibasilar atelectasis. Electronically Signed   By: Franchot Gallo M.D.   On: 04/22/2016 13:16    Microbiology: No results found for this or any previous visit (from the past 240 hour(s)).   Labs: Basic Metabolic Panel:  Recent Labs Lab 04/22/16 1327 04/22/16 1614 04/23/16 0630 04/24/16 0451 04/25/16 0551  NA 136  --  138 135 136  K 3.9  --  3.7 3.6 3.8  CL 104  --  104 104 105  CO2 26  --  28 26 27   GLUCOSE 102*  --  96 94 85  BUN 14  --  18 16 10   CREATININE 0.91  --  0.90 0.85 0.71  CALCIUM 8.7*  --  8.2* 7.9* 8.1*  MG  --  2.1  --   --   --    Liver Function Tests: No results for input(s): AST, ALT, ALKPHOS, BILITOT, PROT, ALBUMIN in the last 168 hours. No results for input(s): LIPASE, AMYLASE in the last 168 hours. No results for input(s): AMMONIA in the last 168 hours. CBC:  Recent Labs Lab 04/22/16 1327  WBC 7.9  NEUTROABS 5.4  HGB 12.1  HCT 36.7  MCV 87.8  PLT 287   Cardiac Enzymes:  Recent Labs Lab 04/22/16 1327 04/22/16 1614  TROPONINI <0.03 <0.03   BNP: BNP (last 3 results)  Recent Labs  06/05/15 1830 04/22/16 1327  BNP 1111.0* 1527.0*    ProBNP (last 3 results) No results for input(s): PROBNP in the last 8760 hours.  CBG: No results for input(s): GLUCAP in the last 168 hours.     Signed:  Mavric Cortright MD.  [facility] 04/25/2016, 11:20 AM

## 2016-04-25 NOTE — Progress Notes (Signed)
Primary Cardiologist: Kate Sable MD  Cardiology Specific Problem List: 1. CAD 2. Chest Pain 3. Acute on Chronic Combined CHF   Subjective:   Headache and chest pain resolved. She has been up in the room without symptoms. Says the top of her head is sore due to severe headache yesterday.   Objective:   Temp:  [97.8 F (36.6 C)-98.2 F (36.8 C)] 98.2 F (36.8 C) (05/25 0656) Pulse Rate:  [60-70] 60 (05/25 0656) Resp:  [18-20] 20 (05/25 0656) BP: (117-130)/(61-68) 130/64 mmHg (05/25 0656) SpO2:  [98 %-100 %] 100 % (05/25 0656) Weight:  [173 lb 12.3 oz (78.82 kg)] 173 lb 12.3 oz (78.82 kg) (05/25 0656) Last BM Date: 04/22/16  Filed Weights   04/23/16 0636 04/24/16 0345 04/25/16 0656  Weight: 173 lb 8 oz (78.699 kg) 174 lb 1.6 oz (78.971 kg) 173 lb 12.3 oz (78.82 kg)    Intake/Output Summary (Last 24 hours) at 04/25/16 0751 Last data filed at 04/24/16 2158  Gross per 24 hour  Intake    123 ml  Output   1050 ml  Net   -927 ml    Telemetry: NSR with PVC's, rates in the 70's.   Exam:  General: No acute distress.  HEENT: Conjunctiva and lids normal, oropharynx clear.  Lungs: Clear to auscultation, nonlabored.  Cardiac: No elevated JVP or bruits. RRR, no gallop or rub.   Abdomen: Normoactive bowel sounds, nontender, nondistended.  Extremities: No pitting edema, distal pulses full.  Lab Results:  Basic Metabolic Panel:  Recent Labs Lab 04/22/16 1614 04/23/16 0630 04/24/16 0451 04/25/16 0551  NA  --  138 135 136  K  --  3.7 3.6 3.8  CL  --  104 104 105  CO2  --  28 26 27   GLUCOSE  --  96 94 85  BUN  --  18 16 10   CREATININE  --  0.90 0.85 0.71  CALCIUM  --  8.2* 7.9* 8.1*  MG 2.1  --   --   --    CBC:  Recent Labs Lab 04/22/16 1327  WBC 7.9  HGB 12.1  HCT 36.7  MCV 87.8  PLT 287    Cardiac Enzymes:  Recent Labs Lab 04/22/16 1327 04/22/16 1614  TROPONINI <0.03 <0.03    Medications:   Scheduled Medications: . aspirin  81 mg  Oral Daily  . atorvastatin  80 mg Oral q1800  . clopidogrel  75 mg Oral Daily  . enoxaparin (LOVENOX) injection  40 mg Subcutaneous Q24H  . furosemide  40 mg Oral Daily  . gabapentin  300 mg Oral QHS  . levothyroxine  88 mcg Oral QAC breakfast  . metoprolol tartrate  37.5 mg Oral BID  . potassium chloride  20 mEq Oral BID  . ranolazine  500 mg Oral BID  . sodium chloride flush  3 mL Intravenous Q12H   PRN Medications: sodium chloride, acetaminophen, HYDROcodone-acetaminophen, ondansetron (ZOFRAN) IV, sodium chloride flush, zolpidem   Assessment and Plan:   1. CAD: Stable symptoms at this time. Continue DAPT, statin, metoprolol, and newly added Ranexa.   2. Headache:  Resolved off Bidil. Will not try Imdur for angina control.  3. Acute on Chronic Combined CHF: EF of 45% with Grade 2 diastolic dysfunction. She has diuresed 3.0 liters since admission. She is breathing better. No DOE. Remains on room air. Continue oral Lasix.  She will keep follow up appointment previously scheduled with Dr. Bronson Ing on 04/30/2016 at 9 am. This  is placed in the discharge follow up in [place].   Phill Myron. Lawrence NP [place]  04/25/2016, 7:51 AM   Attending note:  Patient seen and examined. Discussed the case with Ms. Lawrence NP, modified above note. Ms. Decelles states that she feels much better today, headache has resolved, no further angina moving about in the room. Ranexa was started yesterday and she was taken off of BiDil. She has ruled out for ACS. At this point recommend continuing medical therapy and observation. She has diuresed with Lasix and would continue with oral Lasix therapy as an outpatient. She has a follow-up visit already scheduled with Dr. Bronson Ing on May 30. We will sign off.  Satira Sark, M.D., F.A.C.C.

## 2016-04-25 NOTE — Care Management Important Message (Signed)
Important Message  Patient Details  Name: Maria Fry MRN: [id number] Date of Birth: [birth year]   Medicare Important Message Given:  Yes    Alvie Heidelberg, RN 04/25/2016, 11:28 AM

## 2016-04-25 NOTE — Progress Notes (Signed)
Discharged PT per MD order and protocol. Discharge handouts reviewed/explained. Education completed.  Pt verbalized understanding and left with all belongings. VSS. IV catheter D/C.  Patient wheeled down by staff member.  

## 2016-04-30 ENCOUNTER — Encounter: Payer: Self-pay | Admitting: Cardiovascular Disease

## 2016-04-30 ENCOUNTER — Ambulatory Visit (INDEPENDENT_AMBULATORY_CARE_PROVIDER_SITE_OTHER): Payer: PPO | Admitting: Cardiovascular Disease

## 2016-04-30 VITALS — BP 120/82 | HR 61 | Ht 61.0 in | Wt 174.0 lb

## 2016-04-30 DIAGNOSIS — E785 Hyperlipidemia, unspecified: Secondary | ICD-10-CM

## 2016-04-30 DIAGNOSIS — I252 Old myocardial infarction: Secondary | ICD-10-CM | POA: Diagnosis not present

## 2016-04-30 DIAGNOSIS — Z87898 Personal history of other specified conditions: Secondary | ICD-10-CM

## 2016-04-30 DIAGNOSIS — I38 Endocarditis, valve unspecified: Secondary | ICD-10-CM

## 2016-04-30 DIAGNOSIS — I10 Essential (primary) hypertension: Secondary | ICD-10-CM

## 2016-04-30 DIAGNOSIS — I25118 Atherosclerotic heart disease of native coronary artery with other forms of angina pectoris: Secondary | ICD-10-CM | POA: Diagnosis not present

## 2016-04-30 DIAGNOSIS — Z9289 Personal history of other medical treatment: Secondary | ICD-10-CM

## 2016-04-30 DIAGNOSIS — I5042 Chronic combined systolic (congestive) and diastolic (congestive) heart failure: Secondary | ICD-10-CM

## 2016-04-30 DIAGNOSIS — E784 Other hyperlipidemia: Secondary | ICD-10-CM

## 2016-04-30 NOTE — Progress Notes (Signed)
Patient ID: [name], female   DOB: Sep 14, 1951, 65 y.o.   MRN: [id number]      SUBJECTIVE: The patient presents for post hospitalization follow-up. She was hospitalized for acute on chronic combined systolic and diastolic heart failure. She had had a headache (from nitroglycerin) and some angina. She was started on Lasix. Echocardiogram 04/23/16 showed mildly reduced left ventricular systolic function, [id number] [id number], with no significant change from echo dated 06/06/15. There was moderate concentric LVH with grade 2 diastolic dysfunction and wall motion abnormalities c/w CAD, mild aortic, mild tricuspid, and moderate mitral regurgitation with severe left atrial dilatation.  She is doing well now and denies chest pain, shortness of breath, palpitations, and leg swelling. She does not add salt to her food.  Review of Systems: As per "subjective", otherwise negative.  Allergies  Allergen Reactions  . Vicodin [Hydrocodone-Acetaminophen]     Severe nausea and diarrhea   . Demerol [Meperidine] Nausea And Vomiting  . Lisinopril     Throat swollen  . Lodine [Etodolac] Rash    Current Outpatient Prescriptions  Medication Sig Dispense Refill  . acetaminophen (TYLENOL) 500 MG tablet Take 500 mg by mouth every 6 (six) hours as needed for mild pain or moderate pain. Reported on 04/22/2016    . aspirin 81 MG chewable tablet Chew 1 tablet (81 mg total) by mouth daily.    Marland Kitchen atorvastatin (LIPITOR) 80 MG tablet Take 1 tablet (80 mg total) by mouth daily at 6 PM. 30 tablet 12  . clopidogrel (PLAVIX) 75 MG tablet Take 1 tablet (75 mg total) by mouth daily. 30 tablet 6  . furosemide (LASIX) 40 MG tablet Take 1 tablet (40 mg total) by mouth daily. 30 tablet 3  . gabapentin (NEURONTIN) 300 MG capsule Take 300 mg by mouth at bedtime.     Marland Kitchen HYDROcodone-acetaminophen (NORCO/VICODIN) 5-325 MG tablet Take 1 tablet by mouth every 4 (four) hours as needed for moderate pain. 15 tablet 0  . levothyroxine (SYNTHROID) 88 MCG  tablet Take 1 tablet (88 mcg total) by mouth daily before breakfast. 30 tablet 3  . metoprolol tartrate (LOPRESSOR) 25 MG tablet Take 1.5 tablets (37.5 mg total) by mouth 2 (two) times daily. Take 1 1/2 (total 37.5mg ) table by mouth in morning and evening.    . nitroGLYCERIN (NITROSTAT) 0.4 MG SL tablet Place 1 tablet (0.4 mg total) under the tongue every 5 (five) minutes as needed for chest pain. 25 tablet 3  . potassium chloride SA (KLOR-CON M15) 15 MEQ tablet Take 1 tablet (15 mEq total) by mouth 2 (two) times daily. 60 tablet 3  . ranolazine (RANEXA) 500 MG 12 hr tablet Take 1 tablet (500 mg total) by mouth 2 (two) times daily. (6 week supply of sample given)    . Vitamin D, Ergocalciferol, (DRISDOL) 50000 units CAPS capsule Take 50,000 Units by mouth every 7 (seven) days. Takes on Wednesday's.     No current facility-administered medications for this visit.    Past Medical History  Diagnosis Date  . Essential hypertension   . Hip pain   . Hypothyroidism   . CAD (coronary artery disease)     a. cath 06/07/15 Dist LAD 100%, mid LAD 10%.  . Hyperlipidemia   . Vitamin D deficiency   . Acute on chronic combined systolic (congestive) and diastolic (congestive) heart failure ([place]) 04/23/2016.    EF 45% and grade 2 diastolic dysfunction.    Past Surgical History  Procedure Laterality Date  . Tubal ligation    .  Abdominal hysterectomy    . Cholecystectomy    . Shoulder arthroscopy    . Heel spurs    . Cardiac catheterization N/A 06/06/2015    Procedure: Left Heart Cath and Coronary Angiography;  Surgeon: Peter M Martinique, MD;  Location: [facility];  Service: Cardiovascular;  Laterality: N/A;    Social History   Social History  . Marital Status: Single    Spouse Name: N/A  . Number of Children: N/A  . Years of Education: N/A   Occupational History  . Not on file.   Social History Main Topics  . Smoking status: Former Smoker    Types: Cigarettes    Start date: 12/02/1977     Quit date: 12/02/2002  . Smokeless tobacco: Never Used  . Alcohol Use: No  . Drug Use: No  . Sexual Activity: Yes   Other Topics Concern  . Not on file   Social History Narrative     Filed Vitals:   04/30/16 0844  BP: 120/82  Pulse: 61  Height: 5\' 1"  (1.549 m)  Weight: 174 lb (78.926 kg)  SpO2: 91%    PHYSICAL EXAM General: NAD HEENT: Normal. Neck: No JVD, no thyromegaly. Lungs: Clear to auscultation bilaterally with normal respiratory effort. CV: Nondisplaced PMI.  Regular rate and rhythm, normal S1/S2, no S3/S4, no murmur. No pretibial or periankle edema.     Abdomen: Soft, nontender, no distention.  Neurologic: Alert and oriented.  Psych: Normal affect. Skin: Normal. Musculoskeletal: No gross deformities.    ECG: Most recent ECG reviewed.      ASSESSMENT AND PLAN: 1. CAD with NSTEMI and distal 100% LAD stenosis: Symptomatically stable. Continue aspirin, Plavix, Lipitor, Ranexa, and metoprolol. Allergic to ACEI.   2. Essential HTN: Controlled on metoprolol. No changes.   3. Dyslipidemia: Lipids on 06/06/15 showed total cholesterol 145, triglycerides 49, HDL low at 34, LDL high at 101. Continue high intensity statin therapy with Lipitor 80 mg daily.   4. Chronic systolic and diastolic heart failure: Euvolemic on Lasix 40 mg daly. No changes.  5. Valvular heart disease: Will monitor with surveillance echocardiography.  Dispo: fu 3 months.  Kate Sable, M.D., F.A.C.C.

## 2016-04-30 NOTE — Patient Instructions (Signed)
Your physician recommends that you schedule a follow-up appointment in: 3 months Dr Bronson Ing   Your physician recommends that you continue on your current medications as directed. Please refer to the Current Medication list given to you today.     Thank you for choosing [facility] !

## 2016-05-06 DIAGNOSIS — R55 Syncope and collapse: Secondary | ICD-10-CM | POA: Diagnosis not present

## 2016-05-06 DIAGNOSIS — R569 Unspecified convulsions: Secondary | ICD-10-CM | POA: Diagnosis not present

## 2016-05-06 DIAGNOSIS — R270 Ataxia, unspecified: Secondary | ICD-10-CM | POA: Diagnosis not present

## 2016-05-06 DIAGNOSIS — G629 Polyneuropathy, unspecified: Secondary | ICD-10-CM | POA: Diagnosis not present

## 2016-05-06 DIAGNOSIS — E559 Vitamin D deficiency, unspecified: Secondary | ICD-10-CM | POA: Diagnosis not present

## 2016-05-06 DIAGNOSIS — M79606 Pain in leg, unspecified: Secondary | ICD-10-CM | POA: Diagnosis not present

## 2016-05-06 DIAGNOSIS — G5710 Meralgia paresthetica, unspecified lower limb: Secondary | ICD-10-CM | POA: Diagnosis not present

## 2016-05-06 DIAGNOSIS — R42 Dizziness and giddiness: Secondary | ICD-10-CM | POA: Diagnosis not present

## 2016-05-14 ENCOUNTER — Ambulatory Visit (INDEPENDENT_AMBULATORY_CARE_PROVIDER_SITE_OTHER): Payer: PPO | Admitting: Family Medicine

## 2016-05-14 ENCOUNTER — Encounter: Payer: Self-pay | Admitting: Family Medicine

## 2016-05-14 VITALS — BP 130/68 | HR 88 | Temp 97.9°F | Resp 14 | Ht 61.0 in | Wt 173.0 lb

## 2016-05-14 DIAGNOSIS — I5042 Chronic combined systolic (congestive) and diastolic (congestive) heart failure: Secondary | ICD-10-CM

## 2016-05-14 DIAGNOSIS — M25551 Pain in right hip: Secondary | ICD-10-CM

## 2016-05-14 DIAGNOSIS — E032 Hypothyroidism due to medicaments and other exogenous substances: Secondary | ICD-10-CM | POA: Diagnosis not present

## 2016-05-14 DIAGNOSIS — R7302 Impaired glucose tolerance (oral): Secondary | ICD-10-CM | POA: Diagnosis not present

## 2016-05-14 DIAGNOSIS — M25559 Pain in unspecified hip: Secondary | ICD-10-CM

## 2016-05-14 DIAGNOSIS — I10 Essential (primary) hypertension: Secondary | ICD-10-CM | POA: Diagnosis not present

## 2016-05-14 DIAGNOSIS — G8929 Other chronic pain: Secondary | ICD-10-CM

## 2016-05-14 DIAGNOSIS — I25118 Atherosclerotic heart disease of native coronary artery with other forms of angina pectoris: Secondary | ICD-10-CM | POA: Diagnosis not present

## 2016-05-14 NOTE — Assessment & Plan Note (Signed)
Continue current dose, labs normal

## 2016-05-14 NOTE — Assessment & Plan Note (Signed)
Well controlled, no changes 

## 2016-05-14 NOTE — Patient Instructions (Signed)
Continue current medications Get the labs done fasting this week Release of records- Dr. Merlene Laughter [facility]- in [place]  F/U 3 months

## 2016-05-14 NOTE — Assessment & Plan Note (Signed)
Back at baseline, continue diuretics as prescribed

## 2016-05-14 NOTE — Progress Notes (Signed)
Patient ID: Maria Fry, female   DOB: 1951-08-07, 65 y.o.   MRN: [id number]   Subjective:    Patient ID: Maria Fry, female    DOB: 15-Jan-1951, 65 y.o.   MRN: [id number]  Patient presents for Hospital F/U Patient for hospital follow-up at her last visit which was her establishing visit I actually sent her for admission secondary to chest pain she was found to be in heart failure. She's now on diuretic therapy should Ranexa was also started for her chronic angina. Follow with her cardiologist last week. She states she is doing well she is recently started exercising again and is watching her diet. Her weight at home ranges between 169-171. She has no new concerns today. We didn't further review her history. She has chronic what sounds like hip and back pain she is on gabapentin prescribed by neurology for this. She also uses Biofreeze for chronic knee pain. She injured her knee while she was working a few years ago. She does not follow with orthopedics for this.  History of hypothyroidism her recent TSH levels were normal she did have radioactive ablation.  History of rotator cuff surgery this was performed by [facility] orthopedics she does fairly well with her shoulder now. Next  History of hysterectomy secondary to fibroid tumors. She is overdue for mammogram.  She was a previous smoker she quit in 2004    Review Of Systems:  GEN- denies fatigue, fever, weight loss,weakness, recent illness HEENT- denies eye drainage, change in vision, nasal discharge, CVS- denies chest pain, palpitations RESP- denies SOB, cough, wheeze ABD- denies N/V, change in stools, abd pain GU- denies dysuria, hematuria, dribbling, incontinence MSK- + joint pain, muscle aches, injury Neuro- denies headache, dizziness, syncope, seizure activity       Objective:    BP 130/68 mmHg  Pulse 88  Temp(Src) 97.9 F (36.6 C) (Oral)  Resp 14  Ht 5\' 1"  (1.549 m)  Wt 173 lb (78.472 kg)  BMI 32.70 kg/m2 GEN- NAD,  alert and oriented x3 HEENT- PERRL, EOMI, non injected sclera, pink conjunctiva, MMM, oropharynx clear Neck- Supple, no thyromegaly CVS- RRR, no murmur RESP-CTAB ABD-NABS,soft,NT,ND MSK- Fair ROM spine, HIPS, bilat knees, mild swelling Right knee  EXT- No edema Pulses- Radial, DP- 2+        Assessment & Plan:      Problem List Items Addressed This Visit    Hypothyroidism due to medicaments and other exogenous substances    Continue current dose, labs normal       Hypertension    Well controlled, no changes       Glucose intolerance (impaired glucose tolerance)   Relevant Orders   Hemoglobin A1c   Chronic hip pain    ? 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 Any transcriptional errors that result from this process are unintentional.

## 2016-05-14 NOTE — Assessment & Plan Note (Signed)
?   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

## 2016-05-14 NOTE — Assessment & Plan Note (Signed)
Reviewed cardiology notes, now on ranexa, building up exercise tolerance

## 2016-05-17 LAB — HEMOGLOBIN A1C
Hgb A1c MFr Bld: 5.9 % — ABNORMAL HIGH (ref <5.7)
Mean Plasma Glucose: 123 mg/dL

## 2016-05-17 LAB — LIPID PANEL
Cholesterol: 105 mg/dL — ABNORMAL LOW (ref 125–200)
HDL: 40 mg/dL — ABNORMAL LOW (ref >=46)
LDL Cholesterol: 54 mg/dL (ref <130)
Total CHOL/HDL Ratio: 2.6 Ratio (ref <=5.0)
Triglycerides: 56 mg/dL (ref <150)
VLDL: 11 mg/dL (ref <30)

## 2016-05-21 ENCOUNTER — Encounter: Payer: Self-pay | Admitting: *Deleted

## 2016-05-23 ENCOUNTER — Other Ambulatory Visit: Payer: Self-pay | Admitting: *Deleted

## 2016-05-23 DIAGNOSIS — I5042 Chronic combined systolic (congestive) and diastolic (congestive) heart failure: Secondary | ICD-10-CM

## 2016-05-23 NOTE — Patient Outreach (Signed)
[facility]) Care Management  05/23/2016  Maria Fry 09-26-1951 [id number]  Referral from [facility] Management; recent hospital stay at [facility] from 05/22-05/25/2017 for dx: Acute on chronic systolic & diastolic congestive heart failure.  Telephone call to patient who was advised of reason for call and of [place] care management services. Patient gave HIPPA verification .  Subjective:  Patient voices that she "could not breathe" when she was admitted to  hospital on 04/25/2016.  Patient voices that she has history of having heart attack and developed fluid in lungs in July 2016.  States  she was diagnosed with heart failure at that time.  Others conditions include coronary artery disease, HTN, hypothyroidism, and high cholesterol   Patient voices that she has attended primary care provider (6/13) and cardiology (5/30) appointments since discharge from hospital.  States she is managing own medications & taking consistently as instructed by her doctors. Patients voices understanding of importance of taking medications as directed by providers.   Patient states she has discharge instruction from hospital & voices understanding of managing heart failure & when she needs to call MD office. Weighing herself daily (has talking scales).  Was able to voice weights which ranged from 168-169 this week . Voices that she is not recording (advised patient of importance of recording weights & taking record to MD appointments; voices understanding). Patient is aware guidelines of weight gain and when to report to MD.  Patient consents to [facility] care management services.   Objective: See medication list as noted. See screening and transition of care assessments as noted.    Assessment: Recent hospital admission 05/22-05/25/2017. Diagnosis: Congestive heart failure. Appropriate for transition of care program.  Plan: Refer to community care coordinator. Notified community of  referral. Order placed. Sherrin Daisy, RN BSN CCM Care Management Coordinator [facility] Care Management  [phone]

## 2016-05-29 ENCOUNTER — Other Ambulatory Visit: Payer: Self-pay | Admitting: *Deleted

## 2016-05-29 NOTE — Patient Outreach (Signed)
[facility]) Care Management  05/29/2016  Maria Fry [birth year] [id number]   Unable to reach Maria Fry by phone today. I left a HIPPA compliant message requesting a return call.   Plan: I will reach out to Maria Fry again by phone on Friday.    [place] Management  [phone]

## 2016-05-31 ENCOUNTER — Encounter: Payer: Self-pay | Admitting: *Deleted

## 2016-05-31 ENCOUNTER — Other Ambulatory Visit: Payer: Self-pay | Admitting: *Deleted

## 2016-05-31 NOTE — Patient Outreach (Signed)
[facility]) Care Management  05/31/2016  [name] 28-Oct-1951 [id number]  Maria Fry is a 65 year old lady living in [place] who had a recent hospital stay at [facility] from 04/22/16 - 04/25/16 for acute/chronic systolic and diastolic heart failure. Maria Fry followed up with her cardiologist on 5/30 and her primary care provider on 05/14/16. She was subsequently referred to [place] Management for follow up and care coordination and educational support by [facility]) [facility] Management.  Maria Fry has a history of MI on June 05 2015 and was diagnosed with heart failure during that admission. Maria Fry also carries diagnoses of CAD, HTN, hypothyroidism, and hyperlipidemia.   Today, we discussed CHF Disease Management. Maria Fry is taking her medications as prescribed and is very  Knowledgeable about them. She is weighing daily and says her weight has been between 169 and 171. Since speaking with our telephonic case manager, Sherrin Daisy RN, Maria Fry has been documenting her weight daily and can verbalize need to call her cardiologist for weight gain of 3# overight or 5# in a week or other new or worsening symptom. She is keeping all her medical monitoring and health information together in one place in a notebook.   Maria Fry says the hardest part of managing CHF is the diet. She verbalizes excellent understanding of her prescribed low sodium/heart healthy diet and notes specific foods like cheese that she says she always avoids now to help with fluid volume management. I offered to send or bring to her more materials to help with dietary management and she said she had information given to her at the hospital and from the doctor's office and she felt very good about knowing what to do, it was just hard to stay with the restrictions.   I offered to visit Maria Fry at home in the next week or two and she declined stating she would be more than happy for me to  call and help her stay on track but didn't feel she needed visits.   Plan: I will reach out to Maria Fry again next week to continue to follow up on her CHF Management.    [place] Management  [phone]

## 2016-06-06 ENCOUNTER — Telehealth: Payer: Self-pay | Admitting: Cardiovascular Disease

## 2016-06-06 MED ORDER — RANOLAZINE ER 500 MG PO TB12: 500.0000 mg | ORAL_TABLET | Freq: Two times a day (BID) | ORAL | Status: DC

## 2016-06-06 NOTE — Telephone Encounter (Signed)
Pt is needing her ranolazine (RANEXA) 500 MG 12 hr tablet [id number] sent to [facility]

## 2016-06-07 ENCOUNTER — Other Ambulatory Visit: Payer: Self-pay | Admitting: *Deleted

## 2016-06-07 ENCOUNTER — Encounter: Payer: Self-pay | Admitting: *Deleted

## 2016-06-07 NOTE — Patient Outreach (Signed)
[facility]) Care Management  06/07/2016  REJOICE COMISKEY 1951/01/08 [id number]   I was unable to reach Mrs. Bunner on 2 separate calls today when I called to follow up on her continued progress since her recent hospital discharge.   Plan: I will reach out to Mrs. Prentiss again next week.    [place] Management  [phone]

## 2016-06-14 ENCOUNTER — Other Ambulatory Visit: Payer: Self-pay | Admitting: *Deleted

## 2016-06-14 NOTE — Patient Outreach (Signed)
[facility]) Care Management  06/14/2016  Maria Fry 14-Aug-1951 [id number]   Unable to reach Maria Fry by phone today to follow up on transition of care needs related to CHF disease management and care coordination. I left a HIPPA compliant voice message requesting a return call.   Plan: I will call Maria Fry next week.   [place] Management  [phone]

## 2016-06-18 ENCOUNTER — Other Ambulatory Visit: Payer: Self-pay | Admitting: *Deleted

## 2016-06-18 ENCOUNTER — Encounter: Payer: Self-pay | Admitting: *Deleted

## 2016-06-18 VITALS — BP 137/76 | HR 74 | Wt 168.5 lb

## 2016-06-18 DIAGNOSIS — I5040 Unspecified combined systolic (congestive) and diastolic (congestive) heart failure: Secondary | ICD-10-CM

## 2016-06-18 NOTE — Patient Outreach (Signed)
[place] [facility]) Care Management  06/18/2016  ZILPHA PROVIDENCE 10-04-1951 [id number]  Mrs. JAKALYA MONES is a 65 year old lady living in [place] who had a recent hospital stay at [facility] from 04/22/16 - 04/25/16 for acute/chronic systolic and diastolic heart failure. Mrs. Delude followed up with her cardiologist on 5/30 and her primary care provider on 05/14/16. She was subsequently referred to [place] Management for follow up and care coordination and educational support by [facility]) [facility].  Mrs. Carlozzi has a history of MI on June 05 2015 and was diagnosed with heart failure during that admission. Mrs. Perfetti also carries diagnoses of CAD, HTN, hypothyroidism, and hyperlipidemia.   I reached out to Mrs. Bruening today to follow up on her progress and transition of care and care coordination needs:   Chronic Health Condition (CHF) - Mrs. Looper has talking scales and is weighing herself daily. Her weights have remained in the 166-169# range consistently since her hospital discharge.Early on she was not recording weights but we (telephonic case manager and I) discussed the importance of recording daily weights with Mrs. Schatz and she is now recording. She continues to take Lasix 40mg  po QD as scheduled/prescribed. She denies shortness of breath or swelling or even fatigue. She says today she is being very careful to stay out of the heat and if she goes out is always in an air conditioned car. She is working diligently to adhere to her prescribed low sodium, heart healthy diet. Mrs. Denunzio is active about the house when it is too hot to go outside. When the weather is nice she goes outside to walk.  Mrs. Haroon is seeing her cardiologist regularly (see schedule below).   Chronic Health Condition (RAI Hypothyroidism and impaired glucose tolerance) - Mrs. Napp is seeing her endocrinologist routinely and is scheduled for an upcoming appointment with labs as outlined below; she  continues on Synthroid 50mcg daily as prescribed.   Most recent labs: HgA1C (6/13) = 5.9 (up from 5.8 05/2015) TSH (3/22) = 1.74  Free T4 (3/22) = 1.4  She is scheduled for follow up appointments with the following providers:  Dr. Edger House (Endocrinology) 06/27/16 Dr. Kate Sable (Cardiology) 08/09/16 Dr. Vic Blackbird (Primary Care) 08/19/16  Mrs. Brancato says she feels she is doing very well and can independently manage her chronic health conditions. She has politely declined any face to face/home visits but does appreciate the ongoing support and would like to have continued telephonic case management calls so as to help her "stay on track".   Plan: I will refer Mrs. Sammons back to our telephonic team for ongoing assistance with CHF disease management and oversight/education.   [place] Management  [phone]

## 2016-06-19 ENCOUNTER — Other Ambulatory Visit: Payer: Self-pay | Admitting: "Endocrinology

## 2016-06-19 DIAGNOSIS — E032 Hypothyroidism due to medicaments and other exogenous substances: Secondary | ICD-10-CM | POA: Diagnosis not present

## 2016-06-19 LAB — T4, FREE: Free T4: 1.2 ng/dL (ref 0.8–1.8)

## 2016-06-19 LAB — TSH: TSH: 2.96 mIU/L

## 2016-06-21 ENCOUNTER — Other Ambulatory Visit: Payer: Self-pay

## 2016-06-21 NOTE — Patient Outreach (Signed)
[place] [facility]) Care Management  06/21/2016  BRENYA FROMER 1951-04-16 [id number]  Telephone call to patient for introductory call. Explained to patient health coach and role. Patient receptive to call and health coach.    Plan:RN Health Coach will contact patient in the month of August and patient agrees to next outreach.  [name], RN, MSN [place] [phone]

## 2016-06-27 ENCOUNTER — Ambulatory Visit (INDEPENDENT_AMBULATORY_CARE_PROVIDER_SITE_OTHER): Payer: PPO | Admitting: "Endocrinology

## 2016-06-27 ENCOUNTER — Encounter: Payer: Self-pay | Admitting: "Endocrinology

## 2016-06-27 VITALS — BP 108/69 | HR 59 | Ht 61.0 in | Wt 172.0 lb

## 2016-06-27 DIAGNOSIS — E890 Postprocedural hypothyroidism: Secondary | ICD-10-CM

## 2016-06-27 MED ORDER — METOPROLOL TARTRATE 25 MG PO TABS: 25.0000 mg | ORAL_TABLET | Freq: Every day | ORAL | 1 refills | Status: DC

## 2016-06-27 NOTE — Progress Notes (Signed)
Subjective:    Patient ID: Maria Fry, female    DOB: 14-Aug-1951,    Past Medical History:  Diagnosis Date  . Acute on chronic combined systolic (congestive) and diastolic (congestive) heart failure ([place]) 04/23/2016.   EF 45% and grade 2 diastolic dysfunction.  Marland Kitchen CAD (coronary artery disease)    a. cath 06/07/15 Dist LAD 100%, mid LAD 10%.  . Essential hypertension   . Hip pain   . Hyperlipidemia   . Hypothyroidism   . Vitamin D deficiency    Past Surgical History:  Procedure Laterality Date  . ABDOMINAL HYSTERECTOMY    . CARDIAC CATHETERIZATION N/A 06/06/2015   Procedure: Left Heart Cath and Coronary Angiography;  Surgeon: Peter M Martinique, MD;  Location: [facility];  Service: Cardiovascular;  Laterality: N/A;  . CHOLECYSTECTOMY    . Heel spurs    . SHOULDER ARTHROSCOPY    . TUBAL LIGATION     Social History   Social History  . Marital status: Single    Spouse name: N/A  . Number of children: N/A  . Years of education: N/A   Social History Main Topics  . Smoking status: Former Smoker    Types: Cigarettes    Start date: 12/02/1977    Quit date: 12/02/2002  . Smokeless tobacco: Never Used  . Alcohol use No  . Drug use: No  . Sexual activity: Yes   Other Topics Concern  . None   Social History Narrative  . None   Outpatient Encounter Prescriptions as of 06/27/2016  Medication Sig  . acetaminophen (TYLENOL) 500 MG tablet Take 500 mg by mouth every 6 (six) hours as needed for mild pain or moderate pain. Reported on 04/22/2016  . aspirin 81 MG chewable tablet Chew 1 tablet (81 mg total) by mouth daily.  Marland Kitchen atorvastatin (LIPITOR) 80 MG tablet Take 1 tablet (80 mg total) by mouth daily at 6 PM.  . clopidogrel (PLAVIX) 75 MG tablet Take 1 tablet (75 mg total) by mouth daily.  . furosemide (LASIX) 40 MG tablet Take 1 tablet (40 mg total) by mouth daily.  Marland Kitchen gabapentin (NEURONTIN) 300 MG capsule Take 300 mg by mouth at bedtime.   Marland Kitchen HYDROcodone-acetaminophen  (NORCO/VICODIN) 5-325 MG tablet Take 1 tablet by mouth every 4 (four) hours as needed for moderate pain. (Patient not taking: Reported on 05/31/2016)  . levothyroxine (SYNTHROID) 88 MCG tablet Take 1 tablet (88 mcg total) by mouth daily before breakfast.  . metoprolol tartrate (LOPRESSOR) 25 MG tablet Take 1 tablet (25 mg total) by mouth daily.  . nitroGLYCERIN (NITROSTAT) 0.4 MG SL tablet Place 1 tablet (0.4 mg total) under the tongue every 5 (five) minutes as needed for chest pain.  . potassium chloride SA (KLOR-CON M15) 15 MEQ tablet Take 1 tablet (15 mEq total) by mouth 2 (two) times daily.  . ranolazine (RANEXA) 500 MG 12 hr tablet Take 1 tablet (500 mg total) by mouth 2 (two) times daily. (6 week supply of sample given)  . [DISCONTINUED] metoprolol tartrate (LOPRESSOR) 25 MG tablet Take 1.5 tablets (37.5 mg total) by mouth 2 (two) times daily. Take 1 1/2 (total 37.5mg ) table by mouth in morning and evening.   No facility-administered encounter medications on file as of 06/27/2016.    ALLERGIES: Allergies  Allergen Reactions  . Vicodin [Hydrocodone-Acetaminophen]     Severe nausea and diarrhea   . Demerol [Meperidine] Nausea And Vomiting  . Lisinopril     Throat swollen  .  Lodine [Etodolac] Rash   VACCINATION STATUS: Immunization History  Administered Date(s) Administered  . Influenza,inj,Quad PF,36+ Mos 10/02/2015    Thyroid Problem  Presents for follow-up (She was given RAI therapy for Graves' disease in the middle of September 2016) visit. Patient reports no cold intolerance, diarrhea, fatigue, heat intolerance or palpitations. The symptoms have been improving. Past treatments include iodine 131. The treatment provided significant relief. Risk factors include prior iodine 131 therapy.     Review of Systems  Constitutional: Negative for fatigue and unexpected weight change.  HENT: Negative for trouble swallowing and voice change.   Eyes: Negative for visual disturbance.   Respiratory: Negative for cough, shortness of breath and wheezing.   Cardiovascular: Negative for chest pain, palpitations and leg swelling.  Gastrointestinal: Negative for diarrhea, nausea and vomiting.  Endocrine: Negative for cold intolerance, heat intolerance, polydipsia, polyphagia and polyuria.  Musculoskeletal: Negative for arthralgias and myalgias.  Skin: Negative for color change, pallor, rash and wound.  Neurological: Negative for seizures and headaches.  Psychiatric/Behavioral: Negative for confusion and suicidal ideas.    Objective:    BP 108/69   Pulse (!) 59   Ht 5\' 1"  (1.549 m)   Wt 172 lb (78 kg)   BMI 32.50 kg/m   Wt Readings from Last 3 Encounters:  06/27/16 172 lb (78 kg)  06/18/16 168 lb 8 oz (76.4 kg)  05/31/16 170 lb (77.1 kg)    Physical Exam  Constitutional: She is oriented to person, place, and time. She appears well-developed.  HENT:  Head: Normocephalic and atraumatic.  Eyes: EOM are normal.  Neck: Normal range of motion. Neck supple. No tracheal deviation present. No thyromegaly present.  Cardiovascular: Regular rhythm.   Bradycardic at 59  Pulmonary/Chest: Effort normal and breath sounds normal.  Abdominal: Soft. Bowel sounds are normal. There is no tenderness. There is no guarding.  Musculoskeletal: She exhibits no edema.  Neurological: She is alert and oriented to person, place, and time. She has normal reflexes. No cranial nerve deficit. Coordination normal.  Skin: Skin is warm and dry. No rash noted. No erythema. No pallor.  Psychiatric: She has a normal mood and affect. Judgment normal.    Results for orders placed or performed in visit on 06/19/16  TSH  Result Value Ref Range   TSH 2.96 mIU/L  T4, free  Result Value Ref Range   Free T4 1.2 0.8 - 1.8 ng/dL     Assessment & Plan:   1.  RAI induced Hypothyroidism:   She is status post RAI therapy in September 2016 For hyperthyroidism. Her thyroid function tests are consistent  with appropriate replacement. I advised her to continue levothyroxine 88 g by mouth every morning.   - We discussed about correct intake of levothyroxine, at fasting, with water, separated by at least 30 minutes from breakfast, and separated by more than 4 hours from calcium, iron, multivitamins, acid reflux medications (PPIs). -Patient is made aware of the fact that thyroid hormone replacement is needed for life, dose to be adjusted by periodic monitoring of thyroid function tests.  - She is bradycardic at 61. I advised her to lower metoprolol to 25 mg by mouth only once a day.   I advised patient to maintain close follow up with their PCP for primary care needs. Follow up plan: Return in about 6 months (around 12/28/2016) for follow up with pre-visit labs.  Glade Lloyd, MD Phone: [phone]  Fax: [phone]   06/27/2016, 10:38 AM

## 2016-07-04 ENCOUNTER — Other Ambulatory Visit: Payer: Self-pay | Admitting: Physician Assistant

## 2016-07-16 ENCOUNTER — Ambulatory Visit (INDEPENDENT_AMBULATORY_CARE_PROVIDER_SITE_OTHER): Payer: PPO | Admitting: Family Medicine

## 2016-07-16 ENCOUNTER — Encounter: Payer: Self-pay | Admitting: Family Medicine

## 2016-07-16 VITALS — BP 110/68 | HR 60 | Temp 97.9°F | Resp 16 | Ht 61.0 in | Wt 175.0 lb

## 2016-07-16 DIAGNOSIS — R1310 Dysphagia, unspecified: Secondary | ICD-10-CM

## 2016-07-16 NOTE — Progress Notes (Signed)
   Subjective:    Patient ID: Maria Fry, female    DOB: 1951/02/04, 65 y.o.   MRN: [id number]  Patient presents for Cough (states that she has sore area to inner base of throat or beginning of esophagus- reports that area causes her to cough until she gags or coughs up mucus with food particles in it) Patient here with some dysphagia symptoms. She's had 3 major episodes where she would cough after eating and would have limited food particles come up. She gets a sore spot in her throat as well. She even coughs with liquids and with some of her pills. This is been going on for the past few months. She denies any true reflux symptoms no abdominal pain or chest pain or shortness of breath.       Review Of Systems:  GEN- denies fatigue, fever, weight loss,weakness, recent illness HEENT- denies eye drainage, change in vision, nasal discharge, CVS- denies chest pain, palpitations RESP- denies SOB, cough, wheeze ABD- denies N/V, change in stools, abd pain Neuro- denies headache, dizziness, syncope, seizure activity       Objective:    BP 110/68 (BP Location: Left Arm, Patient Position: Sitting, Cuff Size: Large)   Pulse 60   Temp 97.9 F (36.6 C) (Oral)   Resp 16   Ht 5\' 1"  (1.549 m)   Wt 175 lb (79.4 kg)   SpO2 98%   BMI 33.07 kg/m  GEN- NAD, alert and oriented x3 HEENT- PERRL, EOMI, non injected sclera, pink conjunctiva, MMM, oropharynx clear  Neck- Supple, no LAD, no thyromegaly  CVS- RRR, no murmur RESP-CTAB ABD-NABS,soft,NT,ND EXT-PEDAL  edema Pulses- Radial 2+        Assessment & Plan:      Problem List Items Addressed This Visit    None    Visit Diagnoses    Dysphagia    -  Primary   Concern for stricture causing her dysphagia, with difficulty with liquids, pills., she has some regurgitation as well. No active reflux symptoms. Will send to GI for EGD and evaluation  No weight loss       Note: This dictation was prepared with Dragon dictation along with  smaller phrase technology. Any transcriptional errors that result from this process are unintentional.

## 2016-07-16 NOTE — Patient Instructions (Addendum)
EGD to be done, concern for stricture - where food gets caught and causes cough Referral to Dr. Gala Romney to have this done  F/u AS PREVIOUS

## 2016-07-19 ENCOUNTER — Other Ambulatory Visit: Payer: Self-pay

## 2016-07-19 NOTE — Patient Outreach (Signed)
[facility]) Care Management  07/19/2016  KENDREA PACER [birth year] [id number]   Telephone call to patient for initial assessment.  No answer.  HIPAA compliant voice message left.   Plan: RN Health Coach will attempt patient again in the month of August.    Aniceto Kyser J Ezinne Yogi, RN, MSN [place] [phone]

## 2016-07-31 ENCOUNTER — Other Ambulatory Visit: Payer: Self-pay

## 2016-07-31 NOTE — Patient Outreach (Signed)
[facility]) Care Management  07/31/2016  Maria Fry 1951-06-13 [id number]   Telephone call to patient for initial assessment. No answer. HIPAA compliant voice message left.    Plan: RN Health Coach will attempt patient again in the month of September.  [name], RN, MSN [place] [phone]

## 2016-08-01 ENCOUNTER — Other Ambulatory Visit: Payer: Self-pay | Admitting: "Endocrinology

## 2016-08-01 DIAGNOSIS — G5710 Meralgia paresthetica, unspecified lower limb: Secondary | ICD-10-CM | POA: Diagnosis not present

## 2016-08-01 DIAGNOSIS — G4089 Other seizures: Secondary | ICD-10-CM | POA: Diagnosis not present

## 2016-08-01 DIAGNOSIS — I5040 Unspecified combined systolic (congestive) and diastolic (congestive) heart failure: Secondary | ICD-10-CM | POA: Diagnosis not present

## 2016-08-01 DIAGNOSIS — M79606 Pain in leg, unspecified: Secondary | ICD-10-CM | POA: Diagnosis not present

## 2016-08-01 DIAGNOSIS — R42 Dizziness and giddiness: Secondary | ICD-10-CM | POA: Diagnosis not present

## 2016-08-01 DIAGNOSIS — R55 Syncope and collapse: Secondary | ICD-10-CM | POA: Diagnosis not present

## 2016-08-01 DIAGNOSIS — R270 Ataxia, unspecified: Secondary | ICD-10-CM | POA: Diagnosis not present

## 2016-08-01 DIAGNOSIS — E559 Vitamin D deficiency, unspecified: Secondary | ICD-10-CM | POA: Diagnosis not present

## 2016-08-01 DIAGNOSIS — G629 Polyneuropathy, unspecified: Secondary | ICD-10-CM | POA: Diagnosis not present

## 2016-08-06 ENCOUNTER — Encounter: Payer: Self-pay | Admitting: Gastroenterology

## 2016-08-09 ENCOUNTER — Ambulatory Visit (INDEPENDENT_AMBULATORY_CARE_PROVIDER_SITE_OTHER): Payer: PPO | Admitting: Cardiovascular Disease

## 2016-08-09 ENCOUNTER — Encounter: Payer: Self-pay | Admitting: Cardiovascular Disease

## 2016-08-09 VITALS — BP 120/76 | HR 59 | Ht 65.0 in | Wt 176.0 lb

## 2016-08-09 DIAGNOSIS — R0609 Other forms of dyspnea: Secondary | ICD-10-CM

## 2016-08-09 DIAGNOSIS — R0600 Dyspnea, unspecified: Secondary | ICD-10-CM

## 2016-08-09 DIAGNOSIS — E785 Hyperlipidemia, unspecified: Secondary | ICD-10-CM

## 2016-08-09 DIAGNOSIS — I5042 Chronic combined systolic (congestive) and diastolic (congestive) heart failure: Secondary | ICD-10-CM

## 2016-08-09 DIAGNOSIS — E784 Other hyperlipidemia: Secondary | ICD-10-CM | POA: Diagnosis not present

## 2016-08-09 DIAGNOSIS — I10 Essential (primary) hypertension: Secondary | ICD-10-CM

## 2016-08-09 DIAGNOSIS — I25118 Atherosclerotic heart disease of native coronary artery with other forms of angina pectoris: Secondary | ICD-10-CM | POA: Diagnosis not present

## 2016-08-09 DIAGNOSIS — I252 Old myocardial infarction: Secondary | ICD-10-CM | POA: Diagnosis not present

## 2016-08-09 DIAGNOSIS — I38 Endocarditis, valve unspecified: Secondary | ICD-10-CM

## 2016-08-09 MED ORDER — ATORVASTATIN CALCIUM 80 MG PO TABS: 80.0000 mg | ORAL_TABLET | Freq: Every day | ORAL | 12 refills | Status: DC

## 2016-08-09 MED ORDER — POTASSIUM CHLORIDE CRYS ER 15 MEQ PO TBCR: 15.0000 meq | EXTENDED_RELEASE_TABLET | Freq: Two times a day (BID) | ORAL | 11 refills | Status: DC

## 2016-08-09 MED ORDER — FUROSEMIDE 40 MG PO TABS: 40.0000 mg | ORAL_TABLET | Freq: Every day | ORAL | 11 refills | Status: DC

## 2016-08-09 NOTE — Progress Notes (Signed)
SUBJECTIVE: Pt presents for fu of chronic combined systolic and diastolic heart failure.  Echocardiogram 04/23/16 showed mildly reduced left ventricular systolic function, LVEF [id number], with no significant change from echo dated 06/06/15. There was moderate concentric LVH with grade 2 diastolic dysfunction and wall motion abnormalities c/w CAD, mild aortic, mild tricuspid, and moderate mitral regurgitation with severe left atrial dilatation.  She is doing well and denies chest pain, palpitations, orthopnea, PND, and leg swelling.  She does have exertional dyspnea when shopping and is frustrated by that. She has put on weight due to overeating. Does not routinely exercise.   Review of Systems: As per "subjective", otherwise negative.  Allergies  Allergen Reactions  . Vicodin [Hydrocodone-Acetaminophen]     Severe nausea and diarrhea   . Demerol [Meperidine] Nausea And Vomiting  . Lisinopril     Throat swollen  . Lodine [Etodolac] Rash    Current Outpatient Prescriptions  Medication Sig Dispense Refill  . acetaminophen (TYLENOL) 500 MG tablet Take 500 mg by mouth every 6 (six) hours as needed for mild pain or moderate pain. Reported on 04/22/2016    . aspirin 81 MG chewable tablet Chew 1 tablet (81 mg total) by mouth daily.    Marland Kitchen atorvastatin (LIPITOR) 80 MG tablet Take 1 tablet (80 mg total) by mouth daily at 6 PM. 30 tablet 12  . clopidogrel (PLAVIX) 75 MG tablet Take 1 tablet (75 mg total) by mouth daily. 30 tablet 6  . clopidogrel (PLAVIX) 75 MG tablet TAKE ONE TABLET BY MOUTH ONCE DAILY 30 tablet 3  . furosemide (LASIX) 40 MG tablet Take 1 tablet (40 mg total) by mouth daily. 30 tablet 11  . gabapentin (NEURONTIN) 300 MG capsule Take 300 mg by mouth at bedtime.     Marland Kitchen HYDROcodone-acetaminophen (NORCO/VICODIN) 5-325 MG tablet Take 1 tablet by mouth every 4 (four) hours as needed for moderate pain. 15 tablet 0  . levothyroxine (SYNTHROID, LEVOTHROID) 88 MCG tablet TAKE ONE TABLET BY  MOUTH ONCE DAILY BEFORE BREAKFAST 30 tablet 3  . metoprolol tartrate (LOPRESSOR) 25 MG tablet Take 1 tablet (25 mg total) by mouth daily. 30 tablet 1  . nitroGLYCERIN (NITROSTAT) 0.4 MG SL tablet Place 1 tablet (0.4 mg total) under the tongue every 5 (five) minutes as needed for chest pain. 25 tablet 3  . potassium chloride SA (KLOR-CON M15) 15 MEQ tablet Take 1 tablet (15 mEq total) by mouth 2 (two) times daily. 60 tablet 11  . ranolazine (RANEXA) 500 MG 12 hr tablet Take 1 tablet (500 mg total) by mouth 2 (two) times daily. (6 week supply of sample given) 180 tablet 3   No current facility-administered medications for this visit.     Past Medical History:  Diagnosis Date  . Acute on chronic combined systolic (congestive) and diastolic (congestive) heart failure ([place]) 04/23/2016.   EF 45% and grade 2 diastolic dysfunction.  Marland Kitchen CAD (coronary artery disease)    a. cath 06/07/15 Dist LAD 100%, mid LAD 10%.  . Essential hypertension   . Hip pain   . Hyperlipidemia   . Hypothyroidism   . Vitamin D deficiency     Past Surgical History:  Procedure Laterality Date  . ABDOMINAL HYSTERECTOMY    . CARDIAC CATHETERIZATION N/A 06/06/2015   Procedure: Left Heart Cath and Coronary Angiography;  Surgeon: Peter M Martinique, MD;  Location: [facility];  Service: Cardiovascular;  Laterality: N/A;  . CHOLECYSTECTOMY    . Heel spurs    .  SHOULDER ARTHROSCOPY    . TUBAL LIGATION      Social History   Social History  . Marital status: Single    Spouse name: N/A  . Number of children: N/A  . Years of education: N/A   Occupational History  . Not on file.   Social History Main Topics  . Smoking status: Former Smoker    Types: Cigarettes    Start date: 12/02/1977    Quit date: 12/02/2002  . Smokeless tobacco: Never Used  . Alcohol use No  . Drug use: No  . Sexual activity: Yes   Other Topics Concern  . Not on file   Social History Narrative  . No narrative on file     Vitals:    08/09/16 0838  BP: 120/76  Pulse: (!) 59  SpO2: 99%  Weight: 176 lb (79.8 kg)  Height: 5\' 5"  (1.651 m)    PHYSICAL EXAM General: NAD HEENT: Normal. Neck: No JVD, no thyromegaly. Lungs: Clear to auscultation bilaterally with normal respiratory effort. CV: Nondisplaced PMI.  Regular rate and rhythm, normal S1/S2, no S3/S4, no murmur. No pretibial or periankle edema.     Abdomen: Soft, nontender, no distention.  Neurologic: Alert and oriented.  Psych: Normal affect. Skin: Normal. Musculoskeletal: No gross deformities.    ECG: Most recent ECG reviewed.      ASSESSMENT AND PLAN: 1. CAD with NSTEMI and distal 100% LAD stenosis: Symptomatically stable. Continue aspirin, Plavix, Lipitor, Ranexa, and metoprolol. Allergic to ACEI.   2. Essential HTN: Controlled on metoprolol. No changes.   3. Dyslipidemia: Continue high intensity statin therapy with Lipitor 80 mg daily.   4. Chronic systolic and diastolic heart failure: Euvolemic on Lasix 40 mg daily. No changes.  5. Valvular heart disease: Will monitor with surveillance echocardiography.  6. Exertional dyspnea: Exercise and weight loss encouraged, 30 minutes daily of aerobic activity.  Dispo: fu 6 months.   Kate Sable, M.D., F.A.C.C.

## 2016-08-09 NOTE — Patient Instructions (Signed)
Your physician wants you to follow-up in: 6 Months with Dr. Koneswaran. You will receive a reminder letter in the mail two months in advance. If you don't receive a letter, please call our office to schedule the follow-up appointment.  Your physician recommends that you continue on your current medications as directed. Please refer to the Current Medication list given to you today.  If you need a refill on your cardiac medications before your next appointment, please call your pharmacy.  Thank you for choosing [facility]!   

## 2016-08-19 ENCOUNTER — Encounter: Payer: Self-pay | Admitting: Family Medicine

## 2016-08-19 ENCOUNTER — Telehealth: Payer: Self-pay | Admitting: Family Medicine

## 2016-08-19 ENCOUNTER — Ambulatory Visit (INDEPENDENT_AMBULATORY_CARE_PROVIDER_SITE_OTHER): Payer: PPO | Admitting: Family Medicine

## 2016-08-19 DIAGNOSIS — J302 Other seasonal allergic rhinitis: Secondary | ICD-10-CM | POA: Diagnosis not present

## 2016-08-19 DIAGNOSIS — I10 Essential (primary) hypertension: Secondary | ICD-10-CM | POA: Diagnosis not present

## 2016-08-19 MED ORDER — NITROGLYCERIN 0.4 MG SL SUBL: 0.4000 mg | SUBLINGUAL_TABLET | SUBLINGUAL | 3 refills | Status: DC | PRN

## 2016-08-19 MED ORDER — METOPROLOL TARTRATE 25 MG PO TABS: 25.0000 mg | ORAL_TABLET | Freq: Two times a day (BID) | ORAL | 6 refills | Status: DC

## 2016-08-19 NOTE — Assessment & Plan Note (Addendum)
Loratadine as needed Flu shot given

## 2016-08-19 NOTE — Patient Instructions (Addendum)
After you finish your Vitamin D 50,000 once a week, go to 1000iu once a day  You can take claritin once a day for allergies  Flu shot given  F/U 4 months  Physical

## 2016-08-19 NOTE — Progress Notes (Signed)
   Subjective:    Patient ID: Maria Fry, female    DOB: [birth year], 65 y.o.   MRN: [id number]  Patient presents for Follow-up Issue here for follow-up on chronic medical problems. Her last visit she was referred to gastroenterology secondary to some dysphagia with foods, she has appt on 9/21 to discuss EGD   I reviewed her cardiology note from last week. Her blood pressure was well-controlled she was euvolemic with regards to her diastolic heart failure. She will have surveillance echocardiogram for her valvular heart disease.  Reviewed last endocrine note from July, metoprolol was changed, but per patient cardiology wants 25mg  BID, she has chronic bradycardia   She has had some allergies, mild nasal congestion, some post nasal drip, mild cough, no fever, took tylenol not sure about anti-histamines   Review Of Systems:  GEN- denies fatigue, fever, weight loss,weakness, recent illness HEENT- denies eye drainage, change in vision, nasal discharge, CVS- denies chest pain, palpitations RESP- denies SOB, cough, wheeze ABD- denies N/V, change in stools, abd pain GU- denies dysuria, hematuria, dribbling, incontinence MSK- denies joint pain, muscle aches, injury Neuro- denies headache, dizziness, syncope, seizure activity       Objective:    BP 132/76 (Cuff Size: Normal)   Pulse (!) 52   Temp 97.7 F (36.5 C) (Oral)   Resp 16   Ht 5\' 1"  (1.549 m)   Wt 176 lb (79.8 kg)   BMI 33.25 kg/m  GEN- NAD, alert and oriented x3 HEENT- PERRL, EOMI, non injected sclera, pink conjunctiva, MMM, oropharynx clear,nares clear, TM clear no effusion, no maxillary sinus tenderness  Neck- Supple, no thyromegaly CVS- bradycardia, no murmur RESP-CTAB EXT- No edema Pulses- Radial, DP- 2+        Assessment & Plan:      Problem List Items Addressed This Visit    Seasonal allergies    Loratadine as needed Flu shot given       Hypertension    Well controlled, no changes, reviewed medications  in detail, she will continue the metoprolol BID       Relevant Medications   metoprolol tartrate (LOPRESSOR) 25 MG tablet   nitroGLYCERIN (NITROSTAT) 0.4 MG SL tablet    Other Visit Diagnoses    Encounter for immunization       Relevant Orders   Flu Vaccine QUAD 36+ mos IM (Completed)      Note: This dictation was prepared with Dragon dictation along with smaller phrase technology. Any transcriptional errors that result from this process are unintentional.

## 2016-08-19 NOTE — Telephone Encounter (Signed)
Received fax from [place] back Auth# [id number] Treating Provider Dr. Manus Rudd # of Visits 6 Start Date 08/06/2016 End Date 02/02/2017 CPT 99499 DX R13.10

## 2016-08-19 NOTE — Assessment & Plan Note (Signed)
Well controlled, no changes, reviewed medications in detail, she will continue the metoprolol BID

## 2016-08-20 ENCOUNTER — Other Ambulatory Visit: Payer: Self-pay

## 2016-08-20 NOTE — Patient Outreach (Signed)
[facility]) Care Management  [place]  08/20/2016   Maria Fry 07/17/1951 [id number]  Subjective: Telephone call to patient for initial assessment.  Patient reports she is doing good.  She reports seeing her primary care doctor yesterday and visit went good. She reports having some problems with allergies and the doctor recommended she get some Claritin.  Patient reports some problems with swallowing and will be seeing gastroenterology this month. Patient reports that her weight is 176 lbs.  Patient continues to document her weights and states the doctor wants her to lose some weight. Discussed with patient heart failure management and when to call physician.  She verbalized understanding.   Objective:   Encounter Medications:  Outpatient Encounter Prescriptions as of 08/20/2016  Medication Sig  . acetaminophen (TYLENOL) 500 MG tablet Take 500 mg by mouth every 6 (six) hours as needed for mild pain or moderate pain. Reported on 04/22/2016  . aspirin 81 MG chewable tablet Chew 1 tablet (81 mg total) by mouth daily.  Marland Kitchen atorvastatin (LIPITOR) 80 MG tablet Take 1 tablet (80 mg total) by mouth daily at 6 PM.  . clopidogrel (PLAVIX) 75 MG tablet TAKE ONE TABLET BY MOUTH ONCE DAILY  . furosemide (LASIX) 40 MG tablet Take 1 tablet (40 mg total) by mouth daily.  Marland Kitchen gabapentin (NEURONTIN) 300 MG capsule Take 300 mg by mouth at bedtime.   Marland Kitchen levothyroxine (SYNTHROID, LEVOTHROID) 88 MCG tablet TAKE ONE TABLET BY MOUTH ONCE DAILY BEFORE BREAKFAST  . metoprolol tartrate (LOPRESSOR) 25 MG tablet Take 1 tablet (25 mg total) by mouth 2 (two) times daily.  . nitroGLYCERIN (NITROSTAT) 0.4 MG SL tablet Place 1 tablet (0.4 mg total) under the tongue every 5 (five) minutes as needed for chest pain.  . potassium chloride SA (KLOR-CON M15) 15 MEQ tablet Take 1 tablet (15 mEq total) by mouth 2 (two) times daily.  . ranolazine (RANEXA) 500 MG 12 hr tablet Take 1 tablet (500 mg total) by mouth  2 (two) times daily. (6 week supply of sample given)   No facility-administered encounter medications on file as of 08/20/2016.     Functional Status:  In your present state of health, do you have any difficulty performing the following activities: 08/20/2016 05/31/2016  Hearing? N N  Vision? N N  Difficulty concentrating or making decisions? N N  Walking or climbing stairs? N N  Dressing or bathing? N N  Doing errands, shopping? N N  Preparing Food and eating ? N N  Using the Toilet? N N  In the past six months, have you accidently leaked urine? N N  Do you have problems with loss of bowel control? N N  Managing your Medications? N N  Managing your Finances? N N  Housekeeping or managing your Housekeeping? N N  Some recent data might be hidden    Fall/Depression Screening: PHQ 2/9 Scores 08/20/2016 06/27/2016 06/18/2016 05/23/2016 04/22/2016 02/28/2016 11/30/2015  PHQ - 2 Score 0 0 0 0 0 0 0  PHQ- 9 Score - - - - 3 - -    Assessment: Patient will benefit from health coach outreach for disease management and support.    Plan:  [facility] CM Care Plan Problem One   Flowsheet Row Most Recent Value  Care Plan Problem One  Support Needs for Self Health Management CHF  Role Documenting the Problem One  Health Coach  Care Plan for Problem One  Active  THN Long Term Goal (31-90 days)  Over the next 31 days, patient will demonstrate and/or verbalize understanding of self health management for long term care of CHF  THN Long Term Goal Start Date  08/20/16  Interventions for Problem One [place] coach discussed with patient ways of monitoring heart failure, signs and symptoms, and when to notify physician.   THN CM Short Term Goal #1 (0-30 days)  Over the next 30 days, patient will weight and document weight, calling for weight gain of 3# overnight or 5# in a week  THN CM Short Term Goal #1 Start Date  05/31/16  [facility] CM Short Term Goal #1 Met Date  08/20/16  Interventions for Short  Term Goal #1  Patient documenting weights  THN CM Short Term Goal #2 (0-30 days)  Over the next 30 days, patient will continue to keep all medical and health related information, including calendar with scheduled appointments on hand and in one place  [facility] CM Short Term Goal #2 Start Date  05/31/16  [facility] CM Short Term Goal #2 Met Date  08/20/16  Interventions for Short Term Goal #2  Patient continues to keep appointments in calendar and utilizing it for weight documentation.      RN Health Coach will provide ongoing education for patient on heart failure through phone calls and sending printed information to patient for further discussion.  RN Health Coach sent welcome letter.   RN Health Coach will send initial barriers letter, assessment, and care plan to primary care physician.  RN Health Coach will contact patient in the month of October and patient agrees to next contact.   [name], RN, MSN [place] [phone]

## 2016-08-22 ENCOUNTER — Other Ambulatory Visit: Payer: Self-pay

## 2016-08-22 ENCOUNTER — Ambulatory Visit (INDEPENDENT_AMBULATORY_CARE_PROVIDER_SITE_OTHER): Payer: PPO | Admitting: Nurse Practitioner

## 2016-08-22 ENCOUNTER — Telehealth: Payer: Self-pay

## 2016-08-22 ENCOUNTER — Encounter: Payer: Self-pay | Admitting: Nurse Practitioner

## 2016-08-22 DIAGNOSIS — R1310 Dysphagia, unspecified: Secondary | ICD-10-CM

## 2016-08-22 NOTE — Assessment & Plan Note (Signed)
Patient suffering from what sounds like dysphagia. Possible esophageal web, stricture, ring. She has a flareup of her symptoms she will cough up particles of food and we'll develop a sore throat "feels like a cut" which could be due to scraping passage of foods with a narrowed esophagus. At this point to further evaluate we will proceed with an upper endoscopy with possible dilation. No other red flag/warning signs or symptoms. Return for follow-up in 3 months.  Proceed with EGD +/- dilation with Dr. Gala Romney in near future: the risks, benefits, and alternatives have been discussed with the patient in detail. The patient states understanding and desires to proceed.

## 2016-08-22 NOTE — Progress Notes (Signed)
Primary Care Physician:  Vic Blackbird, MD Primary Gastroenterologist:  Dr. Gala Romney  Chief Complaint  Patient presents with  . sore area in throat    3 flare-ups since Feb "feels like something cut", ultrasound neg    HPI:   Maria Fry is a 65 y.o. female who presents for "sore throat" and possible dysphagia. 3 flareups since February "feels like something is caught" ultrasound completed was normal. PCP notes reviewed related to this problem, last seen for this problem on 07/16/2016 at which point patient was seen for cough and sore throat which causes her to gag or cough and mucus with food particles and at. Noted some dysphagia symptoms with 3 major episodes where she would cough after eating and have limited food particles regurgitate; this results in sore throat. Referred to GI for consideration of upper endoscopy.  Today she states she's doing well overall. Has an episode in the top of her esophagus where it feels like something is "hung up" which results in sore throat and regurgitation of food particles and phlegm. Denies abdominal pain, N/V, GERD symptoms, hematochezia, melena. Denies chest pain, dyspnea, dizziness, lightheadedness, syncope, near syncope. Denies any other upper or lower GI symptoms.  She has had a colonoscopy "a long while ago" and PCP planning to refer her for a repeat at the first of the year.  Past Medical History:  Diagnosis Date  . Acute on chronic combined systolic (congestive) and diastolic (congestive) heart failure ([place]) 04/23/2016.   EF 45% and grade 2 diastolic dysfunction.  Marland Kitchen CAD (coronary artery disease)    a. cath 06/07/15 Dist LAD 100%, mid LAD 10%.  . Essential hypertension   . Hip pain   . Hyperlipidemia   . Hypothyroidism   . MI (myocardial infarction) ([place]) 06/05/2015  . Vitamin D deficiency     Past Surgical History:  Procedure Laterality Date  . ABDOMINAL HYSTERECTOMY    . CARDIAC CATHETERIZATION N/A 06/06/2015   Procedure: Left Heart  Cath and Coronary Angiography;  Surgeon: Peter M Martinique, MD;  Location: [facility];  Service: Cardiovascular;  Laterality: N/A;  . CHOLECYSTECTOMY    . Heel spurs    . SHOULDER ARTHROSCOPY    . TUBAL LIGATION      Current Outpatient Prescriptions  Medication Sig Dispense Refill  . acetaminophen (TYLENOL) 500 MG tablet Take 500 mg by mouth every 6 (six) hours as needed for mild pain or moderate pain. Reported on 04/22/2016    . aspirin 81 MG chewable tablet Chew 1 tablet (81 mg total) by mouth daily.    Marland Kitchen atorvastatin (LIPITOR) 80 MG tablet Take 1 tablet (80 mg total) by mouth daily at 6 PM. 30 tablet 12  . clopidogrel (PLAVIX) 75 MG tablet TAKE ONE TABLET BY MOUTH ONCE DAILY 30 tablet 3  . furosemide (LASIX) 40 MG tablet Take 1 tablet (40 mg total) by mouth daily. 30 tablet 11  . gabapentin (NEURONTIN) 300 MG capsule Take 300 mg by mouth at bedtime.     Marland Kitchen levothyroxine (SYNTHROID, LEVOTHROID) 88 MCG tablet TAKE ONE TABLET BY MOUTH ONCE DAILY BEFORE BREAKFAST 30 tablet 3  . metoprolol tartrate (LOPRESSOR) 25 MG tablet Take 1 tablet (25 mg total) by mouth 2 (two) times daily. 60 tablet 6  . nitroGLYCERIN (NITROSTAT) 0.4 MG SL tablet Place 1 tablet (0.4 mg total) under the tongue every 5 (five) minutes as needed for chest pain. 25 tablet 3  . potassium chloride SA (KLOR-CON M15) 15  MEQ tablet Take 1 tablet (15 mEq total) by mouth 2 (two) times daily. 60 tablet 11  . ranolazine (RANEXA) 500 MG 12 hr tablet Take 1 tablet (500 mg total) by mouth 2 (two) times daily. (6 week supply of sample given) 180 tablet 3   No current facility-administered medications for this visit.     Allergies as of 08/22/2016 - Review Complete 08/22/2016  Allergen Reaction Noted  . Vicodin [hydrocodone-acetaminophen]  04/25/2016  . Demerol [meperidine] Nausea And Vomiting 01/17/2013  . Lisinopril  06/13/2015  . Lodine [etodolac] Rash 01/17/2013    Family History  Problem Relation Age of Onset  . Heart  failure Father     Deceased  . Heart attack Father     Deceased  . Stroke Sister     Deceased  . Heart failure Brother     Deceased  . Cancer Brother     Deceased  . Heart attack Brother     Deceased  . Colon cancer Neg Hx     Social History   Social History  . Marital status: Single    Spouse name: N/A  . Number of children: N/A  . Years of education: N/A   Occupational History  . Not on file.   Social History Main Topics  . Smoking status: Former Smoker    Types: Cigarettes    Start date: 12/02/1977    Quit date: 12/02/2002  . Smokeless tobacco: Never Used  . Alcohol use No  . Drug use: No  . Sexual activity: Yes   Other Topics Concern  . Not on file   Social History Narrative  . No narrative on file    Review of Systems: General: Negative for anorexia, weight loss, fever, chills, fatigue, weakness. ENT: Negative for hoarseness, difficulty swallowing. CV: Negative for chest pain, angina, palpitations, peripheral edema.  Respiratory: Negative for dyspnea at rest, cough, sputum, wheezing.  GI: See history of present illness. MS: Negative for joint pain, low back pain.  Derm: Negative for rash or itching.  Endo: Negative for unusual weight change.  Heme: Negative for bruising or bleeding. Allergy: Negative for rash or hives.    Physical Exam: BP 128/76   Pulse (!) 56   Temp 97.5 F (36.4 C) (Oral)   Ht 5\' 1"  (1.549 m)   Wt 171 lb 6.4 oz (77.7 kg)   BMI 32.39 kg/m  General:   Alert and oriented. Pleasant and cooperative. Well-nourished and well-developed.  Head:  Normocephalic and atraumatic. Eyes:  Without icterus, sclera clear and conjunctiva pink.  Ears:  Normal auditory acuity. Cardiovascular:  S1, S2 present without murmurs appreciated. Extremities without clubbing or edema. Respiratory:  Clear to auscultation bilaterally. No wheezes, rales, or rhonchi. No distress.  Gastrointestinal:  +BS, soft, non-tender and non-distended. No HSM noted. No  guarding or rebound. No masses appreciated.  Rectal:  Deferred  Musculoskalatal:  Symmetrical without gross deformities. Neurologic:  Alert and oriented x4;  grossly normal neurologically. Psych:  Alert and cooperative. Normal mood and affect. Heme/Lymph/Immune: No excessive bruising noted.    08/22/2016 10:20 AM   Disclaimer: This note was dictated with voice recognition software. Similar sounding words can inadvertently be transcribed and may not be corrected upon review.

## 2016-08-22 NOTE — Patient Instructions (Signed)
1. We will schedule your procedure for you. 2. Return for follow-up in 3 months. 3. Call us if any questions or problems.

## 2016-08-22 NOTE — Progress Notes (Signed)
CC'ED TO PCP 

## 2016-08-22 NOTE — Telephone Encounter (Signed)
Called pt to inform of EGD +/-ED being rescheduled. New appt time is 09/11/16 at 7:45am. Instructions mailed.

## 2016-08-26 ENCOUNTER — Telehealth: Payer: Self-pay

## 2016-08-26 NOTE — Telephone Encounter (Signed)
Called pt and rescheduled EGD +/-ED to 09/11/16 at 10:30am. Arrive at 9:30am.

## 2016-09-11 ENCOUNTER — Encounter (HOSPITAL_COMMUNITY)
Admission: AD | Disposition: A | Discharge status: Home / Self Care | Payer: Self-pay | Source: Ambulatory Visit | Attending: Internal Medicine

## 2016-09-11 ENCOUNTER — Encounter (HOSPITAL_COMMUNITY): Payer: Self-pay

## 2016-09-11 ENCOUNTER — Ambulatory Visit (HOSPITAL_COMMUNITY)
Admission: AD | Admit: 2016-09-11 | Discharge: 2016-09-11 | Disposition: A | Discharge status: Home / Self Care | Payer: PPO | Source: Ambulatory Visit | Attending: Internal Medicine | Admitting: Internal Medicine

## 2016-09-11 DIAGNOSIS — I5042 Chronic combined systolic (congestive) and diastolic (congestive) heart failure: Secondary | ICD-10-CM | POA: Diagnosis not present

## 2016-09-11 DIAGNOSIS — Z87891 Personal history of nicotine dependence: Secondary | ICD-10-CM | POA: Diagnosis not present

## 2016-09-11 DIAGNOSIS — I110 Hypertensive heart disease with heart failure: Secondary | ICD-10-CM | POA: Insufficient documentation

## 2016-09-11 DIAGNOSIS — K222 Esophageal obstruction: Secondary | ICD-10-CM | POA: Diagnosis not present

## 2016-09-11 DIAGNOSIS — E785 Hyperlipidemia, unspecified: Secondary | ICD-10-CM | POA: Diagnosis not present

## 2016-09-11 DIAGNOSIS — Z79899 Other long term (current) drug therapy: Secondary | ICD-10-CM | POA: Insufficient documentation

## 2016-09-11 DIAGNOSIS — Z7982 Long term (current) use of aspirin: Secondary | ICD-10-CM | POA: Diagnosis not present

## 2016-09-11 DIAGNOSIS — I252 Old myocardial infarction: Secondary | ICD-10-CM | POA: Diagnosis not present

## 2016-09-11 DIAGNOSIS — Z7902 Long term (current) use of antithrombotics/antiplatelets: Secondary | ICD-10-CM | POA: Insufficient documentation

## 2016-09-11 DIAGNOSIS — R1310 Dysphagia, unspecified: Secondary | ICD-10-CM

## 2016-09-11 DIAGNOSIS — E039 Hypothyroidism, unspecified: Secondary | ICD-10-CM | POA: Diagnosis not present

## 2016-09-11 DIAGNOSIS — I2510 Atherosclerotic heart disease of native coronary artery without angina pectoris: Secondary | ICD-10-CM | POA: Diagnosis not present

## 2016-09-11 DIAGNOSIS — K449 Diaphragmatic hernia without obstruction or gangrene: Secondary | ICD-10-CM | POA: Diagnosis not present

## 2016-09-11 DIAGNOSIS — Q394 Esophageal web: Secondary | ICD-10-CM | POA: Insufficient documentation

## 2016-09-11 HISTORY — PX: MALONEY DILATION: SHX5535

## 2016-09-11 HISTORY — PX: ESOPHAGOGASTRODUODENOSCOPY: SHX5428

## 2016-09-11 SURGERY — EGD (ESOPHAGOGASTRODUODENOSCOPY)
Anesthesia: Moderate Sedation

## 2016-09-11 MED ORDER — SODIUM CHLORIDE 0.9 % IV SOLN
INTRAVENOUS | Status: DC
  Administered 2016-09-11: 10:00:00 via INTRAVENOUS

## 2016-09-11 MED ORDER — MIDAZOLAM HCL 5 MG/5ML IJ SOLN
INTRAMUSCULAR | Status: DC | PRN
  Administered 2016-09-11: 1 mg via INTRAVENOUS
  Administered 2016-09-11: 2 mg via INTRAVENOUS

## 2016-09-11 MED ORDER — LIDOCAINE VISCOUS 2 % MT SOLN
OROMUCOSAL | Status: AC
  Filled 2016-09-11: qty 15

## 2016-09-11 MED ORDER — MEPERIDINE HCL 100 MG/ML IJ SOLN
INTRAMUSCULAR | Status: DC
Start: 2016-09-11 — End: 2016-09-11
  Filled 2016-09-11: qty 2

## 2016-09-11 MED ORDER — FENTANYL CITRATE (PF) 100 MCG/2ML IJ SOLN
INTRAMUSCULAR | Status: AC
  Filled 2016-09-11: qty 2

## 2016-09-11 MED ORDER — ONDANSETRON HCL 4 MG/2ML IJ SOLN
INTRAMUSCULAR | Status: DC | PRN
  Administered 2016-09-11: 4 mg via INTRAVENOUS

## 2016-09-11 MED ORDER — ONDANSETRON HCL 4 MG/2ML IJ SOLN
INTRAMUSCULAR | Status: AC
  Filled 2016-09-11: qty 2

## 2016-09-11 MED ORDER — LIDOCAINE VISCOUS 2 % MT SOLN
OROMUCOSAL | Status: DC | PRN
  Administered 2016-09-11: 1 via OROMUCOSAL

## 2016-09-11 MED ORDER — MIDAZOLAM HCL 5 MG/5ML IJ SOLN
INTRAMUSCULAR | Status: AC
  Filled 2016-09-11: qty 10

## 2016-09-11 MED ORDER — SIMETHICONE 40 MG/0.6ML PO SUSP
ORAL | Status: DC | PRN
  Administered 2016-09-11: 2.5 mL

## 2016-09-11 MED ORDER — FENTANYL CITRATE (PF) 100 MCG/2ML IJ SOLN
INTRAMUSCULAR | Status: DC | PRN
  Administered 2016-09-11: 50 ug via INTRAVENOUS
  Administered 2016-09-11: 25 ug via INTRAVENOUS

## 2016-09-11 NOTE — Interval H&P Note (Signed)
History and Physical Interval Note:  09/11/2016 10:18 AM  Maria Fry  has presented today for surgery, with the diagnosis of dysphagia  The various methods of treatment have been discussed with the patient and family. After consideration of risks, benefits and other options for treatment, the patient has consented to  Procedure(s) with comments: ESOPHAGOGASTRODUODENOSCOPY (EGD) (N/A) - 7:45 am - moved to 10/11 @ 10:30 [place] (N/A) as a surgical intervention .  The patient's history has been reviewed, patient examined, no change in status, stable for surgery.  I have reviewed the patient's chart and labs.  Questions were answered to the patient's satisfaction.      Symptoms as outlined. Not clearly dysphagia. Need to consider Zenker's diverticulum, etc. Patient remains on Plavix per plan.The risks, benefits, limitations, alternatives and imponderables have been reviewed with the patient. Potential for esophageal dilation, biopsy, etc. have also been reviewed.  Questions have been answered. All parties agreeable.   Manus Rudd

## 2016-09-11 NOTE — Discharge Instructions (Addendum)
EGD Discharge instructions Please read the instructions outlined below and refer to this sheet in the next few weeks. These discharge instructions provide you with general information on caring for yourself after you leave the hospital. Your doctor may also give you specific instructions. While your treatment has been planned according to the most current medical practices available, unavoidable complications occasionally occur. If you have any problems or questions after discharge, please call your doctor. ACTIVITY  You may resume your regular activity but move at a slower pace for the next 24 hours.   Take frequent rest periods for the next 24 hours.   Walking will help expel (get rid of) the air and reduce the bloated feeling in your abdomen.   No driving for 24 hours (because of the anesthesia (medicine) used during the test).   You may shower.   Do not sign any important legal documents or operate any machinery for 24 hours (because of the anesthesia used during the test).  NUTRITION  Drink plenty of fluids.   You may resume your normal diet.   Begin with a light meal and progress to your normal diet.   Avoid alcoholic beverages for 24 hours or as instructed by your caregiver.  MEDICATIONS  You may resume your normal medications unless your caregiver tells you otherwise.  WHAT YOU CAN EXPECT TODAY  You may experience abdominal discomfort such as a feeling of fullness or gas pains.  FOLLOW-UP  Your doctor will discuss the results of your test with you.  SEEK IMMEDIATE MEDICAL ATTENTION IF ANY OF THE FOLLOWING OCCUR:  Excessive nausea (feeling sick to your stomach) and/or vomiting.   Severe abdominal pain and distention (swelling).   Trouble swallowing.   Temperature over 101 F (37.8 C).   Rectal bleeding or vomiting of blood.   Office visit with [place] in 6 weeks

## 2016-09-11 NOTE — H&P (View-Only) (Signed)
Primary Care Physician:  Vic Blackbird, MD Primary Gastroenterologist:  Dr. Gala Romney  Chief Complaint  Patient presents with  . sore area in throat    3 flare-ups since Feb "feels like something cut", ultrasound neg    HPI:   Maria Fry is a 65 y.o. female who presents for "sore throat" and possible dysphagia. 3 flareups since February "feels like something is caught" ultrasound completed was normal. PCP notes reviewed related to this problem, last seen for this problem on 07/16/2016 at which point patient was seen for cough and sore throat which causes her to gag or cough and mucus with food particles and at. Noted some dysphagia symptoms with 3 major episodes where she would cough after eating and have limited food particles regurgitate; this results in sore throat. Referred to GI for consideration of upper endoscopy.  Today she states she's doing well overall. Has an episode in the top of her esophagus where it feels like something is "hung up" which results in sore throat and regurgitation of food particles and phlegm. Denies abdominal pain, N/V, GERD symptoms, hematochezia, melena. Denies chest pain, dyspnea, dizziness, lightheadedness, syncope, near syncope. Denies any other upper or lower GI symptoms.  She has had a colonoscopy "a long while ago" and PCP planning to refer her for a repeat at the first of the year.  Past Medical History:  Diagnosis Date  . Acute on chronic combined systolic (congestive) and diastolic (congestive) heart failure ([place]) 04/23/2016.   EF 45% and grade 2 diastolic dysfunction.  Marland Kitchen CAD (coronary artery disease)    a. cath 06/07/15 Dist LAD 100%, mid LAD 10%.  . Essential hypertension   . Hip pain   . Hyperlipidemia   . Hypothyroidism   . MI (myocardial infarction) ([place]) 06/05/2015  . Vitamin D deficiency     Past Surgical History:  Procedure Laterality Date  . ABDOMINAL HYSTERECTOMY    . CARDIAC CATHETERIZATION N/A 06/06/2015   Procedure: Left Heart  Cath and Coronary Angiography;  Surgeon: Peter M Martinique, MD;  Location: [facility];  Service: Cardiovascular;  Laterality: N/A;  . CHOLECYSTECTOMY    . Heel spurs    . SHOULDER ARTHROSCOPY    . TUBAL LIGATION      Current Outpatient Prescriptions  Medication Sig Dispense Refill  . acetaminophen (TYLENOL) 500 MG tablet Take 500 mg by mouth every 6 (six) hours as needed for mild pain or moderate pain. Reported on 04/22/2016    . aspirin 81 MG chewable tablet Chew 1 tablet (81 mg total) by mouth daily.    Marland Kitchen atorvastatin (LIPITOR) 80 MG tablet Take 1 tablet (80 mg total) by mouth daily at 6 PM. 30 tablet 12  . clopidogrel (PLAVIX) 75 MG tablet TAKE ONE TABLET BY MOUTH ONCE DAILY 30 tablet 3  . furosemide (LASIX) 40 MG tablet Take 1 tablet (40 mg total) by mouth daily. 30 tablet 11  . gabapentin (NEURONTIN) 300 MG capsule Take 300 mg by mouth at bedtime.     Marland Kitchen levothyroxine (SYNTHROID, LEVOTHROID) 88 MCG tablet TAKE ONE TABLET BY MOUTH ONCE DAILY BEFORE BREAKFAST 30 tablet 3  . metoprolol tartrate (LOPRESSOR) 25 MG tablet Take 1 tablet (25 mg total) by mouth 2 (two) times daily. 60 tablet 6  . nitroGLYCERIN (NITROSTAT) 0.4 MG SL tablet Place 1 tablet (0.4 mg total) under the tongue every 5 (five) minutes as needed for chest pain. 25 tablet 3  . potassium chloride SA (KLOR-CON M15) 15  MEQ tablet Take 1 tablet (15 mEq total) by mouth 2 (two) times daily. 60 tablet 11  . ranolazine (RANEXA) 500 MG 12 hr tablet Take 1 tablet (500 mg total) by mouth 2 (two) times daily. (6 week supply of sample given) 180 tablet 3   No current facility-administered medications for this visit.     Allergies as of 08/22/2016 - Review Complete 08/22/2016  Allergen Reaction Noted  . Vicodin [hydrocodone-acetaminophen]  04/25/2016  . Demerol [meperidine] Nausea And Vomiting 01/17/2013  . Lisinopril  06/13/2015  . Lodine [etodolac] Rash 01/17/2013    Family History  Problem Relation Age of Onset  . Heart  failure Father     Deceased  . Heart attack Father     Deceased  . Stroke Sister     Deceased  . Heart failure Brother     Deceased  . Cancer Brother     Deceased  . Heart attack Brother     Deceased  . Colon cancer Neg Hx     Social History   Social History  . Marital status: Single    Spouse name: N/A  . Number of children: N/A  . Years of education: N/A   Occupational History  . Not on file.   Social History Main Topics  . Smoking status: Former Smoker    Types: Cigarettes    Start date: 12/02/1977    Quit date: 12/02/2002  . Smokeless tobacco: Never Used  . Alcohol use No  . Drug use: No  . Sexual activity: Yes   Other Topics Concern  . Not on file   Social History Narrative  . No narrative on file    Review of Systems: General: Negative for anorexia, weight loss, fever, chills, fatigue, weakness. ENT: Negative for hoarseness, difficulty swallowing. CV: Negative for chest pain, angina, palpitations, peripheral edema.  Respiratory: Negative for dyspnea at rest, cough, sputum, wheezing.  GI: See history of present illness. MS: Negative for joint pain, low back pain.  Derm: Negative for rash or itching.  Endo: Negative for unusual weight change.  Heme: Negative for bruising or bleeding. Allergy: Negative for rash or hives.    Physical Exam: BP 128/76   Pulse (!) 56   Temp 97.5 F (36.4 C) (Oral)   Ht 5\' 1"  (1.549 m)   Wt 171 lb 6.4 oz (77.7 kg)   BMI 32.39 kg/m  General:   Alert and oriented. Pleasant and cooperative. Well-nourished and well-developed.  Head:  Normocephalic and atraumatic. Eyes:  Without icterus, sclera clear and conjunctiva pink.  Ears:  Normal auditory acuity. Cardiovascular:  S1, S2 present without murmurs appreciated. Extremities without clubbing or edema. Respiratory:  Clear to auscultation bilaterally. No wheezes, rales, or rhonchi. No distress.  Gastrointestinal:  +BS, soft, non-tender and non-distended. No HSM noted. No  guarding or rebound. No masses appreciated.  Rectal:  Deferred  Musculoskalatal:  Symmetrical without gross deformities. Neurologic:  Alert and oriented x4;  grossly normal neurologically. Psych:  Alert and cooperative. Normal mood and affect. Heme/Lymph/Immune: No excessive bruising noted.    08/22/2016 10:20 AM   Disclaimer: This note was dictated with voice recognition software. Similar sounding words can inadvertently be transcribed and may not be corrected upon review.

## 2016-09-11 NOTE — Op Note (Signed)
[facility] Patient Name: Maria Fry Procedure Date: 09/11/2016 10:13 AM MRN: [id number] Date of Birth: 03-Aug-1951 Attending MD: Norvel Richards , MD CSN: [id number] Age: 65 Admit Type: Outpatient Procedure:                Upper GI endoscopy with [facility] dilation Indications:              Dysphagia Providers:                Norvel Richards, MD, Lurline Del, RN, Charlyne Petrin, RN, Purcell Nails. Clemmons, Merchant navy officer Referring MD:              Medicines:                Midazolam mg 3 IV, Fentanyl 50 micrograms IV,                            Ondansetron 4 mg IV Complications:            No immediate complications. Estimated Blood Loss:     Estimated blood loss: none. Procedure:                Pre-Anesthesia Assessment:                           - Prior to the procedure, a History and Physical                            was performed, and patient medications and                            allergies were reviewed. The patient's tolerance of                            previous anesthesia was also reviewed. The risks                            and benefits of the procedure and the sedation                            options and risks were discussed with the patient.                            All questions were answered, and informed consent                            was obtained. Prior Anticoagulants: The patient                            last took Plavix (clopidogrel) 1 day prior to the                            procedure. ASA Grade Assessment: II - A patient  with mild systemic disease. After reviewing the                            risks and benefits, the patient was deemed in                            satisfactory condition to undergo the procedure.                           After obtaining informed consent, the endoscope was                            passed under direct vision. Throughout the   procedure, the patient's blood pressure, pulse, and                            oxygen saturations were monitored continuously. The                            [id number] scope was introduced through the                            mouth, and advanced to the second part of duodenum.                            The upper GI endoscopy was accomplished without                            difficulty. The patient tolerated the procedure                            well. Scope In: 10:29:08 AM Scope Out: 10:38:37 AM Total Procedure Duration: 0 hours 9 minutes 29 seconds  Findings:      The examined esophagus revealed a subtle, noncritical appearing cervical       web otherwise the tubular esophagus appeared normal.      A small hiatal hernia was present.      The gastric cavity was normal otherwise..      The duodenal bulb and second portion of the duodenum were normal.      The scope was withdrawn. Dilation was performed with a Maloney dilator       with mild resistance at 55 Fr. The dilation site was examined following       endoscope reinsertion and showed moderate improvement in luminal       narrowing. Estimated blood loss was minimal. Impression:               - Cervical esophageal web O/W Normal esophagus.                            Dilated.                           - Small hiatal hernia.                           - Normal  duodenal bulb and second portion of the                            duodenum.                           - No specimens collected. Moderate Sedation:      Moderate (conscious) sedation was administered by the endoscopy nurse       and supervised by the endoscopist. The following parameters were       monitored: oxygen saturation, heart rate, blood pressure, respiratory       rate, EKG, adequacy of pulmonary ventilation, and response to care.       Total physician intraservice time was 18 minutes. Recommendation:           - Patient has a contact number available  for                            emergencies. The signs and symptoms of potential                            delayed complications were discussed with the                            patient. Return to normal activities tomorrow.                            Written discharge instructions were provided to the                            patient.                           - Resume previous diet.                           - Continue present medications.                           - No repeat upper endoscopy.                           - Return to GI office in 6 weeks. Procedure Code(s):        --- Professional ---                           [phone], Esophagogastroduodenoscopy, flexible,                            transoral; diagnostic, including collection of                            specimen(s) by brushing or washing, when performed                            (separate procedure)  [id number], Dilation of esophagus, by unguided sound or                            bougie, single or multiple passes                           [id number], Moderate sedation services provided by the                            same physician or other qualified health care                            professional performing the diagnostic or                            therapeutic service that the sedation supports,                            requiring the presence of an independent trained                            observer to assist in the monitoring of the                            patient's level of consciousness and physiological                            status; initial 15 minutes of intraservice time,                            patient age 8 years or older Diagnosis Code(s):        --- Professional ---                           K44.9, Diaphragmatic hernia without obstruction or                            gangrene                           R13.10, Dysphagia, unspecified CPT copyright 2016 American  Medical Association. All rights reserved. The codes documented in this report are preliminary and upon coder review may  be revised to meet current compliance requirements. Cristopher Estimable. Ajia Chadderdon, MD Norvel Richards, MD 09/11/2016 10:46:52 AM This report has been signed electronically. Number of Addenda: 0

## 2016-09-17 ENCOUNTER — Encounter (HOSPITAL_COMMUNITY): Payer: Self-pay | Admitting: Internal Medicine

## 2016-09-17 ENCOUNTER — Ambulatory Visit: Payer: Self-pay

## 2016-09-19 ENCOUNTER — Other Ambulatory Visit: Payer: Self-pay

## 2016-09-19 NOTE — Patient Outreach (Signed)
[facility]) Care Management  09/19/2016  Maria Fry 05-16-1951 [id number]   Telephone call to patient for monthly call. No answer.  HIPAA compliant voice message left.   Plan: RN Health Coach will attempt patient again in the month of October.   [name], RN, MSN [place] [phone]

## 2016-09-27 ENCOUNTER — Other Ambulatory Visit: Payer: Self-pay

## 2016-09-27 NOTE — Patient Outreach (Signed)
[facility]) Care Management  [place]  09/27/2016   Maria Fry 1951/03/17 [id number]  Subjective: Telephone call to patient for monthly call. Patient reports she is doing good.  Patient reports her weight is at 176 lbs. She states she is taking her medications and watching her diet.  Discussed with patient heart failure zones and when to notify physician. She verbalized understanding.   Objective:   Encounter Medications:  Outpatient Encounter Prescriptions as of 09/27/2016  Medication Sig  . acetaminophen (TYLENOL) 500 MG tablet Take 500 mg by mouth every 6 (six) hours as needed for mild pain or moderate pain. Reported on 04/22/2016  . aspirin 81 MG chewable tablet Chew 1 tablet (81 mg total) by mouth daily.  Marland Kitchen atorvastatin (LIPITOR) 80 MG tablet Take 1 tablet (80 mg total) by mouth daily at 6 PM.  . clopidogrel (PLAVIX) 75 MG tablet TAKE ONE TABLET BY MOUTH ONCE DAILY  . furosemide (LASIX) 40 MG tablet Take 1 tablet (40 mg total) by mouth daily.  Marland Kitchen gabapentin (NEURONTIN) 300 MG capsule Take 300 mg by mouth at bedtime.   Marland Kitchen levothyroxine (SYNTHROID, LEVOTHROID) 88 MCG tablet TAKE ONE TABLET BY MOUTH ONCE DAILY BEFORE BREAKFAST  . metoprolol tartrate (LOPRESSOR) 25 MG tablet Take 1 tablet (25 mg total) by mouth 2 (two) times daily.  . nitroGLYCERIN (NITROSTAT) 0.4 MG SL tablet Place 1 tablet (0.4 mg total) under the tongue every 5 (five) minutes as needed for chest pain.  . potassium chloride SA (KLOR-CON M15) 15 MEQ tablet Take 1 tablet (15 mEq total) by mouth 2 (two) times daily.  . ranolazine (RANEXA) 500 MG 12 hr tablet Take 1 tablet (500 mg total) by mouth 2 (two) times daily. (6 week supply of sample given)   No facility-administered encounter medications on file as of 09/27/2016.     Functional Status:  In your present state of health, do you have any difficulty performing the following activities: 08/20/2016 05/31/2016  Hearing? N N  Vision? N N   Difficulty concentrating or making decisions? N N  Walking or climbing stairs? N N  Dressing or bathing? N N  Doing errands, shopping? N N  Preparing Food and eating ? N N  Using the Toilet? N N  In the past six months, have you accidently leaked urine? N N  Do you have problems with loss of bowel control? N N  Managing your Medications? N N  Managing your Finances? N N  Housekeeping or managing your Housekeeping? N N  Some recent data might be hidden    Fall/Depression Screening: PHQ 2/9 Scores 09/27/2016 08/20/2016 06/27/2016 06/18/2016 05/23/2016 04/22/2016 02/28/2016  PHQ - 2 Score 0 0 0 0 0 0 0  PHQ- 9 Score - - - - - 3 -    Assessment: Patient continues to benefit from health coach outreach for disease management and support.    Plan:  [facility] CM Care Plan Problem One   Flowsheet Row Most Recent Value  Care Plan Problem One  Support Needs for Self Health Management CHF  Role Documenting the Problem One  Health Coach  Care Plan for Problem One  Active  THN Long Term Goal (31-90 days)  Over the next 31 days, patient will demonstrate and/or verbalize understanding of self health management for long term care of CHF  THN Long Term Goal Start Date  09/27/16 Barrie Folk continued]  Interventions for Problem One [place] reviewed with patient  ways of monitoring heart failure, signs and symptoms, and when to notify physician.       RN Health Coach will contact patient in the month of November and patient agrees to next outreach.  [name], RN, MSN [place] [phone]

## 2016-10-17 ENCOUNTER — Other Ambulatory Visit: Payer: Self-pay

## 2016-10-17 NOTE — Patient Outreach (Signed)
[facility]) Care Management  [place]  10/17/2016   Maria Fry 1951-06-23 [id number]  Subjective: Telephone call to patient for monthly call. Patient reports she is doing good. Patient reports her weight is 167 lbs.  Patient continues to watch salt intake.  Discussed with patient heart failure and when to notify physician.  She verbalized understanding.   Objective:   Encounter Medications:  Outpatient Encounter Prescriptions as of 10/17/2016  Medication Sig  . acetaminophen (TYLENOL) 500 MG tablet Take 500 mg by mouth every 6 (six) hours as needed for mild pain or moderate pain. Reported on 04/22/2016  . aspirin 81 MG chewable tablet Chew 1 tablet (81 mg total) by mouth daily.  Marland Kitchen atorvastatin (LIPITOR) 80 MG tablet Take 1 tablet (80 mg total) by mouth daily at 6 PM.  . clopidogrel (PLAVIX) 75 MG tablet TAKE ONE TABLET BY MOUTH ONCE DAILY  . furosemide (LASIX) 40 MG tablet Take 1 tablet (40 mg total) by mouth daily.  Marland Kitchen gabapentin (NEURONTIN) 300 MG capsule Take 300 mg by mouth at bedtime.   Marland Kitchen levothyroxine (SYNTHROID, LEVOTHROID) 88 MCG tablet TAKE ONE TABLET BY MOUTH ONCE DAILY BEFORE BREAKFAST  . metoprolol tartrate (LOPRESSOR) 25 MG tablet Take 1 tablet (25 mg total) by mouth 2 (two) times daily.  . nitroGLYCERIN (NITROSTAT) 0.4 MG SL tablet Place 1 tablet (0.4 mg total) under the tongue every 5 (five) minutes as needed for chest pain.  . potassium chloride SA (KLOR-CON M15) 15 MEQ tablet Take 1 tablet (15 mEq total) by mouth 2 (two) times daily.  . ranolazine (RANEXA) 500 MG 12 hr tablet Take 1 tablet (500 mg total) by mouth 2 (two) times daily. (6 week supply of sample given)   No facility-administered encounter medications on file as of 10/17/2016.     Functional Status:  In your present state of health, do you have any difficulty performing the following activities: 08/20/2016 05/31/2016  Hearing? N N  Vision? N N  Difficulty concentrating or making  decisions? N N  Walking or climbing stairs? N N  Dressing or bathing? N N  Doing errands, shopping? N N  Preparing Food and eating ? N N  Using the Toilet? N N  In the past six months, have you accidently leaked urine? N N  Do you have problems with loss of bowel control? N N  Managing your Medications? N N  Managing your Finances? N N  Housekeeping or managing your Housekeeping? N N  Some recent data might be hidden    Fall/Depression Screening: PHQ 2/9 Scores 10/17/2016 09/27/2016 08/20/2016 06/27/2016 06/18/2016 05/23/2016 04/22/2016  PHQ - 2 Score 0 0 0 0 0 0 0  PHQ- 9 Score - - - - - - 3    Assessment: Patient continues to benefit from health coach outreach for disease management and support.   Plan:   [facility] CM Care Plan Problem One   Flowsheet Row Most Recent Value  Care Plan Problem One  Support Needs for Self Health Management CHF  Role Documenting the Problem One  Health Coach  Care Plan for Problem One  Active  THN Long Term Goal (31-90 days)  Over the next 31 days, patient will demonstrate and/or verbalize understanding of self health management for long term care of CHF  THN Long Term Goal Start Date  10/17/16 [goal continued]  Interventions for Problem One [place] discussed with patient ways of monitoring heart failure, signs  and symptoms, and when to notify physician.      RN Health Coach will contact patient in the month of December and patient agrees to next outreach.  [name], RN, MSN [place] [phone]

## 2016-10-23 ENCOUNTER — Ambulatory Visit (INDEPENDENT_AMBULATORY_CARE_PROVIDER_SITE_OTHER): Payer: PPO | Admitting: Nurse Practitioner

## 2016-10-23 ENCOUNTER — Encounter: Payer: Self-pay | Admitting: Nurse Practitioner

## 2016-10-23 VITALS — BP 119/73 | HR 56 | Temp 97.8°F | Ht 63.0 in | Wt 178.6 lb

## 2016-10-23 DIAGNOSIS — R1310 Dysphagia, unspecified: Secondary | ICD-10-CM | POA: Diagnosis not present

## 2016-10-23 NOTE — Patient Instructions (Signed)
1. Return for follow-up as needed. 2. When it is time for your colonoscopy you can be referred back and we can see you to schedule this.   Have a Happy Thanksgiving!!!

## 2016-10-23 NOTE — Progress Notes (Signed)
cc'ed to pcp °

## 2016-10-23 NOTE — Progress Notes (Signed)
Referring Provider: Alycia Rossetti, MD Primary Care Physician:  Vic Blackbird, MD Primary GI:  Dr. Gala Romney  Chief Complaint  Patient presents with  . Dysphagia    improved    HPI:   Maria Fry is a 65 y.o. female who presents for follow-up on dysphagia. The patient was last seen in our office 08/22/2016 for the same. At that time she noted she has had at least 3 episodes of dysphagia. Noted sensation of "something hung up" that results in regurgitation of food particles and phlegm along with sore throat. She was referred for upper endoscopy with possible dilation which was completed on 09/11/2016 and found subtle, noncritical-appearing cervical web otherwise normal esophagus, small hiatal hernia, normal duodenum. Dilation performed with 54 French Maloney dilator which showed moderate improvement in post dilation luminal narrowing.  Today she states she's doing much better. Swallowing has improved significantly. Can even swallow her potassium pills now. Rare GERD symptoms, typically resolves with half a TUMS. Tries to not eat after 6 pm. Denies abdominal pain, N/V, hematochezia, melena. Denies any other upper or lower GI symptoms  Past Medical History:  Diagnosis Date  . Acute on chronic combined systolic (congestive) and diastolic (congestive) heart failure 04/23/2016.   EF 45% and grade 2 diastolic dysfunction.  Marland Kitchen CAD (coronary artery disease)    a. cath 06/07/15 Dist LAD 100%, mid LAD 10%.  . Essential hypertension   . Hip pain   . Hyperlipidemia   . Hypothyroidism   . MI (myocardial infarction) 06/05/2015  . Vitamin D deficiency     Past Surgical History:  Procedure Laterality Date  . ABDOMINAL HYSTERECTOMY    . CARDIAC CATHETERIZATION N/A 06/06/2015   Procedure: Left Heart Cath and Coronary Angiography;  Surgeon: Peter M Martinique, MD;  Location: [place] [facility];  Service: Cardiovascular;  Laterality: N/A;  . CHOLECYSTECTOMY    . ESOPHAGOGASTRODUODENOSCOPY N/A 09/11/2016     Procedure: ESOPHAGOGASTRODUODENOSCOPY (EGD);  Surgeon: Daneil Dolin, MD;  Location: [facility];  Service: Endoscopy;  Laterality: N/A;  7:45 am - moved to 10/11 @ 10:30  . Heel spurs    . MALONEY DILATION N/A 09/11/2016   Procedure: Venia Minks DILATION;  Surgeon: Daneil Dolin, MD;  Location: [facility];  Service: Endoscopy;  Laterality: N/A;  . SHOULDER ARTHROSCOPY    . TUBAL LIGATION      Current Outpatient Prescriptions  Medication Sig Dispense Refill  . acetaminophen (TYLENOL) 500 MG tablet Take 500 mg by mouth every 6 (six) hours as needed for mild pain or moderate pain. Reported on 04/22/2016    . aspirin 81 MG chewable tablet Chew 1 tablet (81 mg total) by mouth daily.    Marland Kitchen atorvastatin (LIPITOR) 80 MG tablet Take 1 tablet (80 mg total) by mouth daily at 6 PM. 30 tablet 12  . clopidogrel (PLAVIX) 75 MG tablet TAKE ONE TABLET BY MOUTH ONCE DAILY 30 tablet 3  . furosemide (LASIX) 40 MG tablet Take 1 tablet (40 mg total) by mouth daily. 30 tablet 11  . gabapentin (NEURONTIN) 300 MG capsule Take 300 mg by mouth at bedtime.     Marland Kitchen levothyroxine (SYNTHROID, LEVOTHROID) 88 MCG tablet TAKE ONE TABLET BY MOUTH ONCE DAILY BEFORE BREAKFAST 30 tablet 3  . metoprolol tartrate (LOPRESSOR) 25 MG tablet Take 1 tablet (25 mg total) by mouth 2 (two) times daily. 60 tablet 6  . nitroGLYCERIN (NITROSTAT) 0.4 MG SL tablet Place 1 tablet (0.4 mg total) under the  tongue every 5 (five) minutes as needed for chest pain. 25 tablet 3  . potassium chloride SA (KLOR-CON M15) 15 MEQ tablet Take 1 tablet (15 mEq total) by mouth 2 (two) times daily. 60 tablet 11  . ranolazine (RANEXA) 500 MG 12 hr tablet Take 1 tablet (500 mg total) by mouth 2 (two) times daily. (6 week supply of sample given) 180 tablet 3   No current facility-administered medications for this visit.     Allergies as of 10/23/2016 - Review Complete 10/23/2016  Allergen Reaction Noted  . Vicodin [hydrocodone-acetaminophen]  04/25/2016  .  Demerol [meperidine] Nausea And Vomiting 01/17/2013  . Lisinopril  06/13/2015  . Lodine [etodolac] Rash 01/17/2013    Family History  Problem Relation Age of Onset  . Heart failure Father     Deceased  . Heart attack Father     Deceased  . Stroke Sister     Deceased  . Heart failure Brother     Deceased  . Cancer Brother     Deceased  . Heart attack Brother     Deceased  . Colon cancer Neg Hx     Social History   Social History  . Marital status: Single    Spouse name: N/A  . Number of children: N/A  . Years of education: N/A   Social History Main Topics  . Smoking status: Former Smoker    Types: Cigarettes    Start date: 12/02/1977    Quit date: 12/02/2002  . Smokeless tobacco: Never Used  . Alcohol use No  . Drug use: No  . Sexual activity: Yes   Other Topics Concern  . None   Social History Narrative  . None    Review of Systems: General: Negative for anorexia, weight loss, fever, chills, fatigue, weakness. Eyes: Negative for vision changes.  ENT: Negative for hoarseness, difficulty swallowing , nasal congestion. CV: Negative for chest pain, angina, palpitations, dyspnea on exertion, peripheral edema.  Respiratory: Negative for dyspnea at rest, dyspnea on exertion, cough, sputum, wheezing.  GI: See history of present illness. GU:  Negative for dysuria, hematuria, urinary incontinence, urinary frequency, nocturnal urination.  MS: Negative for joint pain, low back pain.  Derm: Negative for rash or itching.  Neuro: Negative for weakness, abnormal sensation, seizure, frequent headaches, memory loss, confusion.  Psych: Negative for anxiety, depression, suicidal ideation, hallucinations.  Endo: Negative for unusual weight change.  Heme: Negative for bruising or bleeding. Allergy: Negative for rash or hives.   Physical Exam: BP 119/73   Pulse (!) 56   Temp 97.8 F (36.6 C) (Oral)   Ht 5\' 3"  (1.6 m)   Wt 178 lb 9.6 oz (81 kg)   BMI 31.64 kg/m  General:    Alert and oriented. Pleasant and cooperative. Well-nourished and well-developed.  Head:  Normocephalic and atraumatic. Eyes:  Without icterus, sclera clear and conjunctiva pink.  Ears:  Normal auditory acuity. Mouth:  No deformity or lesions, oral mucosa pink.  Throat/Neck:  Supple, without mass or thyromegaly. Cardiovascular:  S1, S2 present without murmurs appreciated. Normal pulses noted. Extremities without clubbing or edema. Respiratory:  Clear to auscultation bilaterally. No wheezes, rales, or rhonchi. No distress.  Gastrointestinal:  +BS, soft, non-tender and non-distended. No HSM noted. No guarding or rebound. No masses appreciated.  Rectal:  Deferred  Musculoskalatal:  Symmetrical without gross deformities. Normal posture. Skin:  Intact without significant lesions or rashes. Neurologic:  Alert and oriented x4;  grossly normal neurologically. Psych:  Alert and  cooperative. Normal mood and affect. Heme/Lymph/Immune: No significant cervical adenopathy. No excessive bruising noted.    10/23/2016 10:59 AM   Disclaimer: This note was dictated with voice recognition software. Similar sounding words can inadvertently be transcribed and may not be corrected upon review.

## 2016-10-23 NOTE — Assessment & Plan Note (Signed)
Symptoms resolved status post EGD with dilation. Continue to monitor. Return for follow-up as needed.

## 2016-10-31 ENCOUNTER — Other Ambulatory Visit: Payer: Self-pay | Admitting: Cardiovascular Disease

## 2016-11-14 ENCOUNTER — Other Ambulatory Visit: Payer: Self-pay

## 2016-11-14 NOTE — Patient Outreach (Signed)
[place] [facility]) Care Management  11/14/2016  TANAI SCAVONE [birth year] [id number]   Telephone call to patient for monthly call.  No answer. HIPAA compliant voice message left.  Plan: RN Health Coach will attempt patient again in the month of December.  [name], RN, MSN [place] [phone]

## 2016-11-14 NOTE — Patient Outreach (Signed)
[place] [facility]) Care Management  [place]  11/14/2016   RITAMARIE HAUGER 1951/11/05 [id number]  Subjective: Incoming call from patient. She reports she is doing good.  She reports that her weights are staying about the same at 168 lbs. Discussed with patient weights and diet during the holidays as she asked questions.  She verbalized understanding and will continue to stay on her diet.   Objective:   Encounter Medications:  Outpatient Encounter Prescriptions as of 11/14/2016  Medication Sig  . acetaminophen (TYLENOL) 500 MG tablet Take 500 mg by mouth every 6 (six) hours as needed for mild pain or moderate pain. Reported on 04/22/2016  . aspirin 81 MG chewable tablet Chew 1 tablet (81 mg total) by mouth daily.  Marland Kitchen atorvastatin (LIPITOR) 80 MG tablet Take 1 tablet (80 mg total) by mouth daily at 6 PM.  . clopidogrel (PLAVIX) 75 MG tablet TAKE ONE TABLET BY MOUTH ONCE DAILY  . furosemide (LASIX) 40 MG tablet Take 1 tablet (40 mg total) by mouth daily.  Marland Kitchen gabapentin (NEURONTIN) 300 MG capsule Take 300 mg by mouth at bedtime.   Marland Kitchen levothyroxine (SYNTHROID, LEVOTHROID) 88 MCG tablet TAKE ONE TABLET BY MOUTH ONCE DAILY BEFORE BREAKFAST  . metoprolol tartrate (LOPRESSOR) 25 MG tablet Take 1 tablet (25 mg total) by mouth 2 (two) times daily.  . nitroGLYCERIN (NITROSTAT) 0.4 MG SL tablet Place 1 tablet (0.4 mg total) under the tongue every 5 (five) minutes as needed for chest pain.  . ranolazine (RANEXA) 500 MG 12 hr tablet Take 1 tablet (500 mg total) by mouth 2 (two) times daily. (6 week supply of sample given)  . potassium chloride SA (KLOR-CON M15) 15 MEQ tablet Take 1 tablet (15 mEq total) by mouth 2 (two) times daily.   No facility-administered encounter medications on file as of 11/14/2016.     Functional Status:  In your present state of health, do you have any difficulty performing the following activities: 08/20/2016 05/31/2016  Hearing? N N  Vision? N N  Difficulty  concentrating or making decisions? N N  Walking or climbing stairs? N N  Dressing or bathing? N N  Doing errands, shopping? N N  Preparing Food and eating ? N N  Using the Toilet? N N  In the past six months, have you accidently leaked urine? N N  Do you have problems with loss of bowel control? N N  Managing your Medications? N N  Managing your Finances? N N  Housekeeping or managing your Housekeeping? N N  Some recent data might be hidden    Fall/Depression Screening: PHQ 2/9 Scores 11/14/2016 10/17/2016 09/27/2016 08/20/2016 06/27/2016 06/18/2016 05/23/2016  PHQ - 2 Score 0 0 0 0 0 0 0  PHQ- 9 Score - - - - - - -    Assessment: Patient continues to benefit from health coach outreach for disease management and support.    Plan:  [facility] CM Care Plan Problem One   Flowsheet Row Most Recent Value  Care Plan Problem One  Support Needs for Self Health Management CHF  Role Documenting the Problem One  Health Coach  Care Plan for Problem One  Active  THN Long Term Goal (31-90 days)  Over the next 31 days, patient will demonstrate and/or verbalize understanding of self health management for long term care of CHF  THN Long Term Goal Start Date  11/14/16 [goal continued]  Interventions for Problem One Long Term Goal  RN Health Coach reviewed with  patient ways of monitoring heart failure, signs and symptoms, and when to notify physician.      RN Health Coach will contact patient in the month of January and patient agrees to next outreach.  [name], RN, MSN [place] [phone]

## 2016-11-21 ENCOUNTER — Ambulatory Visit: Payer: PPO | Admitting: Nurse Practitioner

## 2016-11-26 ENCOUNTER — Other Ambulatory Visit: Payer: Self-pay | Admitting: "Endocrinology

## 2016-11-26 ENCOUNTER — Ambulatory Visit: Payer: Self-pay

## 2016-12-06 ENCOUNTER — Encounter (HOSPITAL_COMMUNITY): Payer: Self-pay | Admitting: Emergency Medicine

## 2016-12-06 ENCOUNTER — Emergency Department (HOSPITAL_COMMUNITY)
Admission: EM | Admit: 2016-12-06 | Discharge: 2016-12-06 | Disposition: A | Discharge status: Home / Self Care | Payer: PPO | Attending: Emergency Medicine | Admitting: Emergency Medicine

## 2016-12-06 ENCOUNTER — Emergency Department (HOSPITAL_COMMUNITY): Payer: PPO

## 2016-12-06 DIAGNOSIS — I5042 Chronic combined systolic (congestive) and diastolic (congestive) heart failure: Secondary | ICD-10-CM | POA: Diagnosis not present

## 2016-12-06 DIAGNOSIS — R110 Nausea: Secondary | ICD-10-CM | POA: Diagnosis not present

## 2016-12-06 DIAGNOSIS — Z79899 Other long term (current) drug therapy: Secondary | ICD-10-CM | POA: Insufficient documentation

## 2016-12-06 DIAGNOSIS — I2510 Atherosclerotic heart disease of native coronary artery without angina pectoris: Secondary | ICD-10-CM | POA: Diagnosis not present

## 2016-12-06 DIAGNOSIS — R001 Bradycardia, unspecified: Secondary | ICD-10-CM | POA: Diagnosis not present

## 2016-12-06 DIAGNOSIS — R42 Dizziness and giddiness: Secondary | ICD-10-CM | POA: Insufficient documentation

## 2016-12-06 DIAGNOSIS — I252 Old myocardial infarction: Secondary | ICD-10-CM | POA: Insufficient documentation

## 2016-12-06 DIAGNOSIS — I110 Hypertensive heart disease with heart failure: Secondary | ICD-10-CM | POA: Insufficient documentation

## 2016-12-06 DIAGNOSIS — Z7982 Long term (current) use of aspirin: Secondary | ICD-10-CM | POA: Insufficient documentation

## 2016-12-06 DIAGNOSIS — E039 Hypothyroidism, unspecified: Secondary | ICD-10-CM | POA: Diagnosis not present

## 2016-12-06 DIAGNOSIS — Z87891 Personal history of nicotine dependence: Secondary | ICD-10-CM | POA: Insufficient documentation

## 2016-12-06 DIAGNOSIS — R55 Syncope and collapse: Secondary | ICD-10-CM | POA: Diagnosis not present

## 2016-12-06 LAB — I-STAT CHEM 8, ED
BUN: 22 mg/dL — ABNORMAL HIGH (ref 6–20)
Calcium, Ion: 1.14 mmol/L — ABNORMAL LOW (ref 1.15–1.40)
Chloride: 104 mmol/L (ref 101–111)
Creatinine, Ser: 1 mg/dL (ref 0.44–1.00)
Glucose, Bld: 99 mg/dL (ref 65–99)
HCT: 37 % (ref 36.0–46.0)
Hemoglobin: 12.6 g/dL (ref 12.0–15.0)
Potassium: 4.7 mmol/L (ref 3.5–5.1)
Sodium: 139 mmol/L (ref 135–145)
TCO2: 28 mmol/L (ref 0–100)

## 2016-12-06 LAB — CBG MONITORING, ED: Glucose-Capillary: 98 mg/dL (ref 65–99)

## 2016-12-06 MED ORDER — MECLIZINE HCL 12.5 MG PO TABS: 12.5000 mg | ORAL_TABLET | Freq: Three times a day (TID) | ORAL | 0 refills | Status: DC | PRN

## 2016-12-06 MED ORDER — MECLIZINE HCL 12.5 MG PO TABS
12.5000 mg | ORAL_TABLET | Freq: Once | ORAL | Status: AC
  Administered 2016-12-06: 12.5 mg via ORAL
  Filled 2016-12-06: qty 1

## 2016-12-06 MED ORDER — SODIUM CHLORIDE 0.9 % IV BOLUS (SEPSIS)
1000.0000 mL | Freq: Once | INTRAVENOUS | Status: AC
  Administered 2016-12-06: 1000 mL via INTRAVENOUS

## 2016-12-06 NOTE — ED Provider Notes (Signed)
Maria Fry DEPT Provider Note   CSN: [id number] Arrival date & time: 12/06/16  1101  By signing my name below, I, Hilbert Odor, attest that this documentation has been prepared under the direction and in the presence of Merrily Pew, MD. Electronically Signed: Hilbert Odor, Scribe. 12/06/16. 11:49 AM. History   Chief Complaint Chief Complaint  Patient presents with  . Dizziness    The history is provided by the patient and a friend. No language interpreter was used.   HPI Comments: Maria Fry is a 66 y.o. female who presents to the Emergency Department complaining of dizziness that began yesterday morning around 8 am. Patient reports that she felt the same way this morning. She states that she has never felt this way before. She reports near syncope episodes whenever she attempts to walk. She also reports a little nausea but no vomiting. She denies headaches, visual changes, numbness, tingling, and new rashes. She also denies SOB and CP. She has a hx of CHF. Past Medical History:  Diagnosis Date  . Acute on chronic combined systolic (congestive) and diastolic (congestive) heart failure 04/23/2016.   EF 45% and grade 2 diastolic dysfunction.  Marland Kitchen CAD (coronary artery disease)    a. cath 06/07/15 Dist LAD 100%, mid LAD 10%.  . Essential hypertension   . Hip pain   . Hyperlipidemia   . Hypothyroidism   . MI (myocardial infarction) 06/05/2015  . Vitamin D deficiency     Patient Active Problem List   Diagnosis Date Noted  . Dysphagia 08/22/2016  . Seasonal allergies 08/19/2016  . Glucose intolerance (impaired glucose tolerance) 05/14/2016  . Chronic hip pain 05/14/2016  . Chronic combined systolic and diastolic CHF (congestive heart failure) ([place]) 05/14/2016  . DOE (dyspnea on exertion) 04/22/2016  . Hypothyroidism following radioiodine therapy 11/30/2015  . CAD (coronary artery disease)   . NSTEMI (non-ST elevated myocardial infarction) ([place])   . Hypertension 06/05/2015   . Leg weakness, bilateral 03/02/2013    Past Surgical History:  Procedure Laterality Date  . ABDOMINAL HYSTERECTOMY    . CARDIAC CATHETERIZATION N/A 06/06/2015   Procedure: Left Heart Cath and Coronary Angiography;  Surgeon: Peter M Martinique, MD;  Location: [facility];  Service: Cardiovascular;  Laterality: N/A;  . CHOLECYSTECTOMY    . ESOPHAGOGASTRODUODENOSCOPY N/A 09/11/2016   Procedure: ESOPHAGOGASTRODUODENOSCOPY (EGD);  Surgeon: Daneil Dolin, MD;  Location: [facility];  Service: Endoscopy;  Laterality: N/A;  7:45 am - moved to 10/11 @ 10:30  . Heel spurs    . MALONEY DILATION N/A 09/11/2016   Procedure: Venia Minks DILATION;  Surgeon: Daneil Dolin, MD;  Location: [facility];  Service: Endoscopy;  Laterality: N/A;  . SHOULDER ARTHROSCOPY    . TUBAL LIGATION      OB History    Gravida Para Term Preterm AB Living   3 3 3          SAB TAB Ectopic Multiple Live Births                   Home Medications    Prior to Admission medications   Medication Sig Start Date End Date Taking? Authorizing Provider  acetaminophen (TYLENOL) 500 MG tablet Take 500 mg by mouth every 6 (six) hours as needed for mild pain or moderate pain. Reported on 04/22/2016   Yes Historical Provider, MD  aspirin 81 MG chewable tablet Chew 1 tablet (81 mg total) by mouth daily. 06/08/15  Yes Bhavinkumar Bhagat, PA  atorvastatin (LIPITOR)  80 MG tablet Take 1 tablet (80 mg total) by mouth daily at 6 PM. 08/09/16  Yes [name], MD  clopidogrel (PLAVIX) 75 MG tablet TAKE ONE TABLET BY MOUTH ONCE DAILY 10/31/16  Yes [name], MD  furosemide (LASIX) 40 MG tablet Take 1 tablet (40 mg total) by mouth daily. 08/09/16  Yes [name], MD  gabapentin (NEURONTIN) 300 MG capsule Take 300 mg by mouth at bedtime.    Yes Historical Provider, MD  levothyroxine (SYNTHROID, LEVOTHROID) 88 MCG tablet TAKE ONE TABLET BY MOUTH ONCE DAILY BEFORE BREAKFAST 11/27/16  Yes Cassandria Anger, MD    loratadine (CLARITIN) 10 MG tablet Take 10 mg by mouth daily as needed for allergies.   Yes Historical Provider, MD  metoprolol tartrate (LOPRESSOR) 25 MG tablet Take 1 tablet (25 mg total) by mouth 2 (two) times daily. 08/19/16  Yes Alycia Rossetti, MD  nitroGLYCERIN (NITROSTAT) 0.4 MG SL tablet Place 1 tablet (0.4 mg total) under the tongue every 5 (five) minutes as needed for chest pain. 08/19/16  Yes Alycia Rossetti, MD  potassium chloride SA (KLOR-CON M15) 15 MEQ tablet Take 1 tablet (15 mEq total) by mouth 2 (two) times daily. 08/09/16  Yes [name], MD  ranolazine (RANEXA) 500 MG 12 hr tablet Take 1 tablet (500 mg total) by mouth 2 (two) times daily. (6 week supply of sample given) 06/06/16  Yes [name], MD  meclizine (ANTIVERT) 12.5 MG tablet Take 1 tablet (12.5 mg total) by mouth 3 (three) times daily as needed for dizziness. 12/06/16   Merrily Pew, MD    Family History Family History  Problem Relation Age of Onset  . Heart failure Father     Deceased  . Heart attack Father     Deceased  . Stroke Sister     Deceased  . Heart failure Brother     Deceased  . Cancer Brother     Deceased  . Heart attack Brother     Deceased  . Colon cancer Neg Hx     Social History Social History  Substance Use Topics  . Smoking status: Former Smoker    Types: Cigarettes    Start date: 12/02/1977    Quit date: 12/02/2002  . Smokeless tobacco: Never Used  . Alcohol use No     Allergies   Vicodin [hydrocodone-acetaminophen]; Demerol [meperidine]; Lisinopril; and Lodine [etodolac]   Review of Systems Review of Systems  Eyes: Negative for visual disturbance.  Respiratory: Negative for shortness of breath.   Cardiovascular: Negative for chest pain.  Gastrointestinal: Positive for nausea. Negative for vomiting.  Skin: Negative for rash.  Neurological: Positive for dizziness and syncope. Negative for numbness and headaches.  All other systems reviewed and are  negative.    Physical Exam Updated Vital Signs BP 147/73 (BP Location: Right Arm)   Pulse (!) 58   Temp 97.4 F (36.3 C) (Oral)   Resp 14   Ht 5' (1.524 m)   Wt 178 lb (80.7 kg)   SpO2 100%   BMI 34.76 kg/m   Physical Exam  Constitutional: She is oriented to person, place, and time. No distress.  HENT:  Head: Normocephalic and atraumatic.  Eyes: EOM are normal. Pupils are equal, round, and reactive to light.  Neck: Normal range of motion. Neck supple.  Cardiovascular: Regular rhythm.  Bradycardia present.  Exam reveals no gallop and no friction rub.   No murmur heard. Bradycardia at 56. No  edema to lower extremities.  Pulmonary/Chest: Effort normal. No respiratory distress.  Abdominal: She exhibits no distension. There is no tenderness.  Neurological: She is alert and oriented to person, place, and time. No cranial nerve deficit. Coordination abnormal.  Cranial nerves are intact. Finger to nose is little slow. No dysmetria. Biceps and triceps are normal. Lower extremities are norrmal. Achilles reflex is normal. Not able to elicit patella reflex. She walked falling to the left, slowly. No truncal ataxia.  Psychiatric: She has a normal mood and affect.    ED Treatments / Results  DIAGNOSTIC STUDIES: Oxygen Saturation is 99% on RA, normal by my interpretation.    COORDINATION OF CARE: 11:49 AM Discussed treatment plan with pt at bedside and pt agreed to plan.  Labs (all labs ordered are listed, but only abnormal results are displayed) Labs Reviewed  I-STAT CHEM 8, ED - Abnormal; Notable for the following:       Result Value   BUN 22 (*)    Calcium, Ion 1.14 (*)    All other components within normal limits  CBG MONITORING, ED    EKG  EKG Interpretation  Date/Time:  Friday December 06 2016 11:24:42 EST Ventricular Rate:  57 PR Interval:    QRS Duration: 110 QT Interval:  443 QTC Calculation: 432 R Axis:   38 Text Interpretation:  Sinus rhythm Consider left  atrial enlargement Borderline T abnormalities, anterior leads improved t waves and ST changes in inferior and lateral leads compared to 04/25/16 Confirmed by [facility] MD, Corene Cornea [phone]) on 12/06/2016 11:38:37 AM       Radiology Ct Head Wo Contrast  Result Date: 12/06/2016 CLINICAL DATA:  Dizziness, nausea. EXAM: CT HEAD WITHOUT CONTRAST TECHNIQUE: Contiguous axial images were obtained from the base of the skull through the vertex without intravenous contrast. COMPARISON:  CT scan of August 07, 2015. FINDINGS: Brain: No evidence of acute infarction, hemorrhage, hydrocephalus, extra-axial collection or mass lesion/mass effect. Vascular: No hyperdense vessel or unexpected calcification. Skull: Normal. Negative for fracture or focal lesion. Sinuses/Orbits: No acute finding. Other: None. IMPRESSION: Normal head CT. Electronically Signed   By: Marijo Conception, M.D.   On: 12/06/2016 12:14    Procedures Procedures (including critical care time)  Medications Ordered in ED Medications  sodium chloride 0.9 % bolus 1,000 mL (0 mLs Intravenous Stopped 12/06/16 1354)  meclizine (ANTIVERT) tablet 12.5 mg (12.5 mg Oral Given 12/06/16 1235)     Initial Impression / Assessment and Plan / ED Course  I have reviewed the triage vital signs and the nursing notes.  Pertinent labs & imaging results that were available during my care of the patient were reviewed by me and considered in my medical decision making (see chart for details).  Clinical Course     Improved dizziness with fluids and meclizine. Also states that she recently had a change in thyroid medication, which could be related to symptoms. otherwise patient ambulating without dfificulty, workup negative. Will continue following up with PCP.   Final Clinical Impressions(s) / ED Diagnoses   Final diagnoses:  Vertigo    New Prescriptions Discharge Medication List as of 12/06/2016  2:39 PM    START taking these medications   Details  meclizine  (ANTIVERT) 12.5 MG tablet Take 1 tablet (12.5 mg total) by mouth 3 (three) times daily as needed for dizziness., Starting Fri 12/06/2016, Print       I personally performed the services described in this documentation, which was scribed in my presence. The  recorded information has been reviewed and is accurate.   Merrily Pew, MD 12/06/16 [phone]

## 2016-12-06 NOTE — ED Triage Notes (Signed)
Patient complains of dizziness that started yesterday morning around 0800. State some nausea and generalized weakness.

## 2016-12-06 NOTE — ED Notes (Signed)
Pt ambulated to restroom sizziness improved. States, "I feel a lot better"

## 2016-12-11 ENCOUNTER — Other Ambulatory Visit: Payer: Self-pay

## 2016-12-11 MED ORDER — LEVOTHYROXINE SODIUM 88 MCG PO TABS: ORAL_TABLET | ORAL | 3 refills | Status: DC

## 2016-12-11 NOTE — Patient Outreach (Signed)
[facility]) Care Management  [place]  12/11/2016   Maria Fry [birth year] [id number]  Subjective: Telephone call to patient for monthly call. Patient reports she had to go to the emergency room for dizziness.  Patient attributes it to change in manufacturer of thyroid medication.  Patient states that she will be getting her medication from [facility] being that [facility] did not have her medication.  Patient reports that her weights have been the same and that she is following her regimen for her heart failure.  Discussed with patient heart failure and when to notify physician. She verbalized understanding.    Objective:   Encounter Medications:  Outpatient Encounter Prescriptions as of 12/11/2016  Medication Sig  . acetaminophen (TYLENOL) 500 MG tablet Take 500 mg by mouth every 6 (six) hours as needed for mild pain or moderate pain. Reported on 04/22/2016  . aspirin 81 MG chewable tablet Chew 1 tablet (81 mg total) by mouth daily.  Marland Kitchen atorvastatin (LIPITOR) 80 MG tablet Take 1 tablet (80 mg total) by mouth daily at 6 PM.  . clopidogrel (PLAVIX) 75 MG tablet TAKE ONE TABLET BY MOUTH ONCE DAILY  . furosemide (LASIX) 40 MG tablet Take 1 tablet (40 mg total) by mouth daily.  Marland Kitchen gabapentin (NEURONTIN) 300 MG capsule Take 300 mg by mouth at bedtime.   Marland Kitchen levothyroxine (SYNTHROID, LEVOTHROID) 88 MCG tablet TAKE ONE TABLET BY MOUTH ONCE DAILY BEFORE BREAKFAST  . loratadine (CLARITIN) 10 MG tablet Take 10 mg by mouth daily as needed for allergies.  Marland Kitchen meclizine (ANTIVERT) 12.5 MG tablet Take 1 tablet (12.5 mg total) by mouth 3 (three) times daily as needed for dizziness.  . metoprolol tartrate (LOPRESSOR) 25 MG tablet Take 1 tablet (25 mg total) by mouth 2 (two) times daily.  . nitroGLYCERIN (NITROSTAT) 0.4 MG SL tablet Place 1 tablet (0.4 mg total) under the tongue every 5 (five) minutes as needed for chest pain.  . potassium chloride SA (KLOR-CON M15) 15 MEQ tablet Take 1  tablet (15 mEq total) by mouth 2 (two) times daily.  . ranolazine (RANEXA) 500 MG 12 hr tablet Take 1 tablet (500 mg total) by mouth 2 (two) times daily. (6 week supply of sample given)   No facility-administered encounter medications on file as of 12/11/2016.     Functional Status:  In your present state of health, do you have any difficulty performing the following activities: 08/20/2016 05/31/2016  Hearing? N N  Vision? N N  Difficulty concentrating or making decisions? N N  Walking or climbing stairs? N N  Dressing or bathing? N N  Doing errands, shopping? N N  Preparing Food and eating ? N N  Using the Toilet? N N  In the past six months, have you accidently leaked urine? N N  Do you have problems with loss of bowel control? N N  Managing your Medications? N N  Managing your Finances? N N  Housekeeping or managing your Housekeeping? N N  Some recent data might be hidden    Fall/Depression Screening: PHQ 2/9 Scores 12/11/2016 11/14/2016 10/17/2016 09/27/2016 08/20/2016 06/27/2016 06/18/2016  PHQ - 2 Score 0 0 0 0 0 0 0  PHQ- 9 Score - - - - - - -    Assessment: Patient continues to benefit from health coach outreach for disease management and support.    Plan:  [facility] CM Care Plan Problem One   Flowsheet Row Most Recent Value  Care Plan Problem One  Support  Needs for Self Health Management CHF  Role Documenting the Problem One  Health Coach  Care Plan for Problem One  Active  THN Long Term Goal (31-90 days)  Over the next 31 days, patient will demonstrate and/or verbalize understanding of self health management for long term care of CHF  THN Long Term Goal Start Date  12/11/16 [goal continued]  Interventions for Problem One [facility] reinforced with patient ways of monitoring heart failure, signs and symptoms, and when to notify physician.      RN Health Coach will contact patient in the month of February and patient agrees to next outreach.  [name], RN, MSN [place] [phone]

## 2016-12-23 ENCOUNTER — Encounter: Payer: Self-pay | Admitting: Family Medicine

## 2016-12-23 ENCOUNTER — Ambulatory Visit (INDEPENDENT_AMBULATORY_CARE_PROVIDER_SITE_OTHER): Payer: PPO | Admitting: Family Medicine

## 2016-12-23 VITALS — BP 132/78 | HR 56 | Temp 97.8°F | Resp 16 | Ht 63.0 in | Wt 186.0 lb

## 2016-12-23 DIAGNOSIS — Z1159 Encounter for screening for other viral diseases: Secondary | ICD-10-CM

## 2016-12-23 DIAGNOSIS — Z1231 Encounter for screening mammogram for malignant neoplasm of breast: Secondary | ICD-10-CM

## 2016-12-23 DIAGNOSIS — Z1211 Encounter for screening for malignant neoplasm of colon: Secondary | ICD-10-CM | POA: Diagnosis not present

## 2016-12-23 DIAGNOSIS — I10 Essential (primary) hypertension: Secondary | ICD-10-CM

## 2016-12-23 DIAGNOSIS — Z0000 Encounter for general adult medical examination without abnormal findings: Secondary | ICD-10-CM | POA: Diagnosis not present

## 2016-12-23 DIAGNOSIS — R7302 Impaired glucose tolerance (oral): Secondary | ICD-10-CM | POA: Diagnosis not present

## 2016-12-23 DIAGNOSIS — I25118 Atherosclerotic heart disease of native coronary artery with other forms of angina pectoris: Secondary | ICD-10-CM

## 2016-12-23 DIAGNOSIS — Z1239 Encounter for other screening for malignant neoplasm of breast: Secondary | ICD-10-CM

## 2016-12-23 LAB — COMPREHENSIVE METABOLIC PANEL
ALT: 11 U/L (ref 6–29)
AST: 20 U/L (ref 10–35)
Albumin: 3.4 g/dL — ABNORMAL LOW (ref 3.6–5.1)
Alkaline Phosphatase: 75 U/L (ref 33–130)
BUN: 14 mg/dL (ref 7–25)
CO2: 24 mmol/L (ref 20–31)
Calcium: 8.6 mg/dL (ref 8.6–10.4)
Chloride: 103 mmol/L (ref 98–110)
Creat: 0.92 mg/dL (ref 0.50–0.99)
Glucose, Bld: 94 mg/dL (ref 70–99)
Potassium: 4.7 mmol/L (ref 3.5–5.3)
Sodium: 137 mmol/L (ref 135–146)
Total Bilirubin: 0.5 mg/dL (ref 0.2–1.2)
Total Protein: 7.4 g/dL (ref 6.1–8.1)

## 2016-12-23 LAB — CBC WITH DIFFERENTIAL/PLATELET
Basophils Absolute: 0 cells/uL (ref 0–200)
Basophils Relative: 0 %
Eosinophils Absolute: 156 cells/uL (ref 15–500)
Eosinophils Relative: 2 %
HCT: 38 % (ref 35.0–45.0)
Hemoglobin: 12.1 g/dL (ref 12.0–15.0)
Lymphocytes Relative: 34 %
Lymphs Abs: 2652 cells/uL (ref 850–3900)
MCH: 30.5 pg (ref 27.0–33.0)
MCHC: 31.8 g/dL — ABNORMAL LOW (ref 32.0–36.0)
MCV: 95.7 fL (ref 80.0–100.0)
MPV: 10.3 fL (ref 7.5–12.5)
Monocytes Absolute: 546 cells/uL (ref 200–950)
Monocytes Relative: 7 %
Neutro Abs: 4446 cells/uL (ref 1500–7800)
Neutrophils Relative %: 57 %
Platelets: 280 10*3/uL (ref 140–400)
RBC: 3.97 MIL/uL (ref 3.80–5.10)
RDW: 14.4 % (ref 11.0–15.0)
WBC: 7.8 10*3/uL (ref 3.8–10.8)

## 2016-12-23 LAB — LIPID PANEL
Cholesterol: 119 mg/dL (ref <200)
HDL: 52 mg/dL (ref >50)
LDL Cholesterol: 56 mg/dL (ref <100)
Total CHOL/HDL Ratio: 2.3 Ratio (ref <5.0)
Triglycerides: 55 mg/dL (ref <150)
VLDL: 11 mg/dL (ref <30)

## 2016-12-23 LAB — HEMOGLOBIN A1C
Hgb A1c MFr Bld: 5.2 % (ref <5.7)
Mean Plasma Glucose: 103 mg/dL

## 2016-12-23 LAB — HEPATITIS C ANTIBODY: HCV Ab: NEGATIVE

## 2016-12-23 MED ORDER — ZOSTER VACCINE LIVE 19400 UNT/0.65ML ~~LOC~~ SUSR: 0.6500 mL | Freq: Once | SUBCUTANEOUS | 0 refills | Status: AC

## 2016-12-23 NOTE — Assessment & Plan Note (Signed)
Blood pressure controlled, no change to medication

## 2016-12-23 NOTE — Assessment & Plan Note (Signed)
No current CP, has not required Nitroglyercin

## 2016-12-23 NOTE — Patient Instructions (Signed)
Schedule your Mammogram We will call with lab results  Referral to GI for colonoscopy Shingles Vaccine sent to pharmacy F/U 6 months

## 2016-12-23 NOTE — Assessment & Plan Note (Signed)
Check A1C 

## 2016-12-23 NOTE — Progress Notes (Signed)
Subjective:   Patient presents for Medicare Annual/Subsequent preventive examination.   Hypertension- controlled, [place] DM last A1C 5.9%  Seen in ED on 1/5 with dizziness, EKG showed bradycardia ( which is not new). She also had recent change in thyroid medication she received 2 different brands of thyroid medication, both made her sick, so she went to a different pharmacy and now has her levoxyl.    Symptoms improved with meclizine as well . I STAT showed cr 1.14 , normal potassium , CBG was  98  Has appt with Dr. [name] Feb 1st   CHF- she is on the CHF program with cardiology, no sign of fluid overload. But has gained 8 lbs since NOv, admits to eating to much and not exercising  Review Past Medical/Family/Social: Per EMR   Risk Factors  Current exercise habits: Exercise, [employer]( has not been working out since Dec) Dietary issues discussed: Yes   Cardiac risk factors: Obesity (BMI >= 30 kg/m2). CHF,HTN  Depression Screen  (Note: if answer to either of the following is "Yes", a more complete depression screening is indicated)  Over the past two weeks, have you felt down, depressed or hopeless? No Over the past two weeks, have you felt little interest or pleasure in doing things? No Have you lost interest or pleasure in daily life? No Do you often feel hopeless? No Do you cry easily over simple problems? No   Activities of Daily Living  In your present state of health, do you have any difficulty performing the following activities?:  Driving? No  Managing money? No  Feeding yourself? No  Getting from bed to chair? Yes  Climbing a flight of stairs? Yes Preparing food and eating?: No  Bathing or showering? No  Getting dressed: No  Getting to the toilet? No  Using the toilet:No  Moving around from place to place: No  In the past year have you fallen or had a near fall?:No  Are you sexually active? No  Do you have more than one partner? No   Hearing Difficulties: No  Do you  often ask people to speak up or repeat themselves? No  Do you experience ringing or noises in your ears? No Do you have difficulty understanding soft or whispered voices? No  Do you feel that you have a problem with memory? No Do you often misplace items? No  Do you feel safe at home? Yes  Cognitive Testing  Alert? Yes Normal Appearance?Yes  Oriented to person? Yes Place? Yes  Time? Yes  Recall of three objects? Yes  Can perform simple calculations? Yes  Displays appropriate judgment?Yes  Can read the correct time from a watch face?Yes   List the Names of Other Physician/Practitioners you currently use: Cardiology , Endocrine- Dr. [name]   Screening Tests / Date Colonoscopy     Due August this year               Zostavax - Due  Mammogram -Due  Influenza Vaccine UTD  Tetanus/tdap UTD PAP- S/P Hysterectomy  ROS: GEN- denies fatigue, fever, weight loss,weakness, recent illness HEENT- denies eye drainage, change in vision, nasal discharge, CVS- denies chest pain, palpitations RESP- denies SOB, cough, wheeze ABD- denies N/V, change in stools, abd pain GU- denies dysuria, hematuria, dribbling, incontinence MSK- denies joint pain, muscle aches, injury Neuro- denies headache, dizziness, syncope, seizure activity  Physical: GEN- NAD, alert and oriented x3 HEENT- PERRL, EOMI, non injected sclera, pink conjunctiva, MMM, oropharynx clear Neck- Supple, no thryomegaly  CVS- bradycardia HR 60's, no murmur RESP-CTAB ABD-NABS,soft,NT,ND  Neuro-CNII-XII in tact no deficits  EXT- No edema Pulses- Radial, DP- 2+     Assessment:    Annual wellness medicare exam   Plan:    During the course of the visit the patient was educated and counseled about appropriate screening and preventive services including:  Screening mammography - Orders placed  Colorectal cancer screening - REFERRAL TO gi  Shingles vaccine. Prescription given to that she can get the vaccine at the pharmacy or Medicare  part D.  Screen + for depression. PHQ-3 score of 12    Follow-up with endocrine for thyroid and cardiology     Not clear if symptoms were due to brand of different thryoid medication but possible, could have also just been a vertigo spell. The bradycardia is chronic with her cardiac meds   Discussed living will and POA, given handouts to complete forms    Discussed her nutrition and getting back to [facility]   Note wears glasses but no eye insurance currently   Diet review for nutrition referral? Yes ____ Not Indicated __x__  Patient Instructions (the written plan) was given to the patient.  Medicare Attestation  I have personally reviewed:  The patient's medical and social history  Their use of alcohol, tobacco or illicit drugs  Their current medications and supplements  The patient's functional ability including ADLs,fall risks, home safety risks, cognitive, and hearing and visual impairment  Diet and physical activities  Evidence for depression or mood disorders  The patient's weight, height, BMI, and visual acuity have been recorded in the chart. I have made referrals, counseling, and provided education to the patient based on review of the above and I have provided the patient with a written personalized care plan for preventive services.

## 2016-12-26 ENCOUNTER — Other Ambulatory Visit: Payer: Self-pay | Admitting: "Endocrinology

## 2016-12-26 ENCOUNTER — Encounter: Payer: Self-pay | Admitting: *Deleted

## 2016-12-26 DIAGNOSIS — E890 Postprocedural hypothyroidism: Secondary | ICD-10-CM | POA: Diagnosis not present

## 2016-12-26 LAB — TSH: TSH: 2.61 mIU/L

## 2016-12-26 LAB — T4, FREE: Free T4: 1.1 ng/dL (ref 0.8–1.8)

## 2017-01-01 ENCOUNTER — Ambulatory Visit (HOSPITAL_COMMUNITY): Payer: PPO

## 2017-01-01 ENCOUNTER — Other Ambulatory Visit: Payer: Self-pay | Admitting: Family Medicine

## 2017-01-01 ENCOUNTER — Ambulatory Visit (HOSPITAL_COMMUNITY)
Admission: RE | Admit: 2017-01-01 | Discharge: 2017-01-01 | Disposition: A | Discharge status: Home / Self Care | Payer: PPO | Source: Ambulatory Visit | Attending: Family Medicine | Admitting: Family Medicine

## 2017-01-01 DIAGNOSIS — Z1231 Encounter for screening mammogram for malignant neoplasm of breast: Secondary | ICD-10-CM | POA: Insufficient documentation

## 2017-01-02 ENCOUNTER — Ambulatory Visit (INDEPENDENT_AMBULATORY_CARE_PROVIDER_SITE_OTHER): Payer: PPO | Admitting: "Endocrinology

## 2017-01-02 ENCOUNTER — Encounter: Payer: Self-pay | Admitting: "Endocrinology

## 2017-01-02 VITALS — BP 130/78 | HR 64 | Ht 63.0 in | Wt 188.0 lb

## 2017-01-02 DIAGNOSIS — E890 Postprocedural hypothyroidism: Secondary | ICD-10-CM

## 2017-01-02 MED ORDER — LEVOTHYROXINE SODIUM 100 MCG PO TABS: ORAL_TABLET | ORAL | 1 refills | Status: DC

## 2017-01-02 NOTE — Progress Notes (Signed)
Subjective:    Patient ID: Maria Fry, female    DOB: Jun 05, 1951,    Past Medical History:  Diagnosis Date  . Acute on chronic combined systolic (congestive) and diastolic (congestive) heart failure 04/23/2016.   EF 45% and grade 2 diastolic dysfunction.  Marland Kitchen CAD (coronary artery disease)    a. cath 06/07/15 Dist LAD 100%, mid LAD 10%.  . Essential hypertension   . Hip pain   . Hyperlipidemia   . Hypothyroidism   . MI (myocardial infarction) 06/05/2015  . Vitamin D deficiency    Past Surgical History:  Procedure Laterality Date  . ABDOMINAL HYSTERECTOMY    . CARDIAC CATHETERIZATION N/A 06/06/2015   Procedure: Left Heart Cath and Coronary Angiography;  Surgeon: Peter M Martinique, MD;  Location: [place] [facility];  Service: Cardiovascular;  Laterality: N/A;  . CHOLECYSTECTOMY    . ESOPHAGOGASTRODUODENOSCOPY N/A 09/11/2016   Procedure: ESOPHAGOGASTRODUODENOSCOPY (EGD);  Surgeon: Daneil Dolin, MD;  [facility];  Service: Endoscopy;  Laterality: N/A;  7:45 am - moved to 10/11 @ 10:30  . Heel spurs    . MALONEY DILATION N/A 09/11/2016   Procedure: Venia Minks DILATION;  Surgeon: Daneil Dolin, MD;  [facility]: [facility];  Service: Endoscopy;  Laterality: N/A;  . SHOULDER ARTHROSCOPY    . TUBAL LIGATION     Social History   Social History  . Marital status: Single    Spouse name: N/A  . Number of children: N/A  . Years of education: N/A   Social History Main Topics  . Smoking status: Former Smoker    Types: Cigarettes    Start date: 12/02/1977    Quit date: 12/02/2002  . Smokeless tobacco: Never Used  . Alcohol use No  . Drug use: No  . Sexual activity: Yes   Other Topics Concern  . None   Social History Narrative  . None   Outpatient Encounter Prescriptions as of 01/02/2017  Medication Sig  . acetaminophen (TYLENOL) 500 MG tablet Take 500 mg by mouth every 6 (six) hours as needed for mild pain or moderate pain. Reported on 04/22/2016  . aspirin 81 MG chewable  tablet Chew 1 tablet (81 mg total) by mouth daily.  Marland Kitchen atorvastatin (LIPITOR) 80 MG tablet Take 1 tablet (80 mg total) by mouth daily at 6 PM.  . clopidogrel (PLAVIX) 75 MG tablet TAKE ONE TABLET BY MOUTH ONCE DAILY  . furosemide (LASIX) 40 MG tablet Take 1 tablet (40 mg total) by mouth daily.  Marland Kitchen gabapentin (NEURONTIN) 300 MG capsule Take 300 mg by mouth at bedtime.   Marland Kitchen levothyroxine (SYNTHROID, LEVOTHROID) 100 MCG tablet TAKE ONE TABLET BY MOUTH ONCE DAILY BEFORE BREAKFAST  . loratadine (CLARITIN) 10 MG tablet Take 10 mg by mouth daily as needed for allergies.  Marland Kitchen meclizine (ANTIVERT) 12.5 MG tablet Take 1 tablet (12.5 mg total) by mouth 3 (three) times daily as needed for dizziness.  . metoprolol tartrate (LOPRESSOR) 25 MG tablet Take 1 tablet (25 mg total) by mouth 2 (two) times daily.  . nitroGLYCERIN (NITROSTAT) 0.4 MG SL tablet Place 1 tablet (0.4 mg total) under the tongue every 5 (five) minutes as needed for chest pain.  . potassium chloride SA (KLOR-CON M15) 15 MEQ tablet Take 1 tablet (15 mEq total) by mouth 2 (two) times daily.  . ranolazine (RANEXA) 500 MG 12 hr tablet Take 1 tablet (500 mg total) by mouth 2 (two) times daily. (6 week supply of sample given)  . [  DISCONTINUED] levothyroxine (SYNTHROID, LEVOTHROID) 88 MCG tablet TAKE ONE TABLET BY MOUTH ONCE DAILY BEFORE BREAKFAST   No facility-administered encounter medications on file as of 01/02/2017.    ALLERGIES: Allergies  Allergen Reactions  . Vicodin [Hydrocodone-Acetaminophen]     Severe nausea and diarrhea   . Demerol [Meperidine] Nausea And Vomiting  . Lisinopril     Throat swollen  . Lodine [Etodolac] Rash   VACCINATION STATUS: Immunization History  Administered Date(s) Administered  . Influenza,inj,Quad PF,36+ Mos 10/02/2015, 08/19/2016    Thyroid Problem  Presents for follow-up (She was given RAI therapy for Graves' disease in the middle of September 2016) visit. Patient reports no cold intolerance, diarrhea,  fatigue, heat intolerance or palpitations. The symptoms have been improving. Past treatments include iodine 131. The treatment provided significant relief. Risk factors include prior iodine 131 therapy.     Review of Systems  Constitutional: Positive for unexpected weight change. Negative for fatigue.  HENT: Negative for trouble swallowing and voice change.   Eyes: Negative for visual disturbance.  Respiratory: Negative for cough, shortness of breath and wheezing.   Cardiovascular: Negative for chest pain, palpitations and leg swelling.  Gastrointestinal: Negative for diarrhea, nausea and vomiting.  Endocrine: Negative for cold intolerance, heat intolerance, polydipsia, polyphagia and polyuria.  Musculoskeletal: Negative for arthralgias and myalgias.  Skin: Negative for color change, pallor, rash and wound.  Neurological: Negative for seizures and headaches.  Psychiatric/Behavioral: Negative for confusion and suicidal ideas.    Objective:    BP 130/78   Pulse 64   Ht 5\' 3"  (1.6 m)   Wt 188 lb (85.3 kg)   BMI 33.30 kg/m   Wt Readings from Last 3 Encounters:  01/02/17 188 lb (85.3 kg)  12/23/16 186 lb (84.4 kg)  12/06/16 178 lb (80.7 kg)    Physical Exam  Constitutional: She is oriented to person, place, and time. She appears well-developed.  HENT:  Head: Normocephalic and atraumatic.  Eyes: EOM are normal.  Neck: Normal range of motion. Neck supple. No tracheal deviation present. No thyromegaly present.  Cardiovascular: Regular rhythm.   Pulmonary/Chest: Effort normal and breath sounds normal.  Abdominal: Soft. Bowel sounds are normal. There is no tenderness. There is no guarding.  Musculoskeletal: She exhibits no edema.  Neurological: She is alert and oriented to person, place, and time. She has normal reflexes. No cranial nerve deficit. Coordination normal.  Skin: Skin is warm and dry. No rash noted. No erythema. No pallor.  Psychiatric: She has a normal mood and affect.  Judgment normal.    Results for orders placed or performed in visit on 12/26/16  TSH  Result Value Ref Range   TSH 2.61 mIU/L  T4, free  Result Value Ref Range   Free T4 1.1 0.8 - 1.8 ng/dL     Assessment & Plan:   1.  RAI induced Hypothyroidism:   She is status post RAI therapy in September 2016 For hyperthyroidism. Her thyroid function tests are consistent with appropriate replacement. However she would benefit from slight increase in her levothyroxine. I will prescribe levothyroxine 100 g by mouth every morning.   - We discussed about correct intake of levothyroxine, at fasting, with water, separated by at least 30 minutes from breakfast, and separated by more than 4 hours from calcium, iron, multivitamins, acid reflux medications (PPIs). -Patient is made aware of the fact that thyroid hormone replacement is needed for life, dose to be adjusted by periodic monitoring of thyroid function tests.  I advised patient  to maintain close follow up with their PCP for primary care needs. Follow up plan: Return in about 6 months (around 07/02/2017) for follow up with pre-visit labs.  Glade Lloyd, MD Phone: [phone]  Fax: [phone]   01/02/2017, 9:59 AM

## 2017-01-07 ENCOUNTER — Other Ambulatory Visit: Payer: Self-pay

## 2017-01-07 NOTE — Patient Outreach (Signed)
[facility]) Care Management  01/07/2017  Maria Fry 1951-10-13 [id number]   Telephone call to patient for monthly call.  No answer.  HIPAA compliant voice message left.    Plan: RN Health Coach will attempt patient again in the month of February.    [name], RN, MSN [place] [phone]

## 2017-01-14 ENCOUNTER — Telehealth: Payer: Self-pay

## 2017-01-14 NOTE — Telephone Encounter (Signed)
Pt received a letter from DS to be triage. Please call [phone]

## 2017-01-17 ENCOUNTER — Other Ambulatory Visit: Payer: Self-pay

## 2017-01-17 NOTE — Patient Outreach (Signed)
[facility]) Care Management  01/17/2017  JILLIAM FRITCHER [birth year] [id number]   2nd telephone call to patient for monthly call. No answer.  HIPAA compliant voice message left.  Plan: RN Health Coach will attempt patient again in the month of February.  [name], RN, MSN [place] [phone]

## 2017-01-22 NOTE — Telephone Encounter (Signed)
Pt is not having any problems now. She is aware she is on RECALL for 07/2017. She wants to wait til then to schedule and she will call me if she has problems before then.

## 2017-01-24 ENCOUNTER — Other Ambulatory Visit: Payer: Self-pay

## 2017-01-24 NOTE — Patient Outreach (Signed)
[facility]) Care Management  [place]  01/24/2017   Maria Fry 1951/05/22 [id number]  Subjective: Incoming return call from patient.  Patient reports she is doing well. However, patient reports that her weight is up to 180 lbs.  Patient states that she has not had a sudden weight gain, swelling to legs or feet, or increased shortness of breath. Patient reports she has been working out at [employer] regularly.  She reports when she saw Dr. [name] last he did not think she had fluid weight gain.  Discussed with patient weight and to notify physician if she gains 3 lbs in a day or 5 lbs in a week.  She verbalized understanding.  Also discussed with patient other symptoms of heart failure and to notify physician if necessary.  She verbalized understanding.    Objective:   Encounter Medications:  Outpatient Encounter Prescriptions as of 01/24/2017  Medication Sig  . acetaminophen (TYLENOL) 500 MG tablet Take 500 mg by mouth every 6 (six) hours as needed for mild pain or moderate pain. Reported on 04/22/2016  . aspirin 81 MG chewable tablet Chew 1 tablet (81 mg total) by mouth daily.  Marland Kitchen atorvastatin (LIPITOR) 80 MG tablet Take 1 tablet (80 mg total) by mouth daily at 6 PM.  . clopidogrel (PLAVIX) 75 MG tablet TAKE ONE TABLET BY MOUTH ONCE DAILY  . furosemide (LASIX) 40 MG tablet Take 1 tablet (40 mg total) by mouth daily.  Marland Kitchen gabapentin (NEURONTIN) 300 MG capsule Take 300 mg by mouth at bedtime.   Marland Kitchen levothyroxine (SYNTHROID, LEVOTHROID) 100 MCG tablet TAKE ONE TABLET BY MOUTH ONCE DAILY BEFORE BREAKFAST  . loratadine (CLARITIN) 10 MG tablet Take 10 mg by mouth daily as needed for allergies.  Marland Kitchen meclizine (ANTIVERT) 12.5 MG tablet Take 1 tablet (12.5 mg total) by mouth 3 (three) times daily as needed for dizziness.  . metoprolol tartrate (LOPRESSOR) 25 MG tablet Take 1 tablet (25 mg total) by mouth 2 (two) times daily.  . nitroGLYCERIN (NITROSTAT) 0.4 MG SL tablet Place 1 tablet  (0.4 mg total) under the tongue every 5 (five) minutes as needed for chest pain.  . potassium chloride SA (KLOR-CON M15) 15 MEQ tablet Take 1 tablet (15 mEq total) by mouth 2 (two) times daily.  . ranolazine (RANEXA) 500 MG 12 hr tablet Take 1 tablet (500 mg total) by mouth 2 (two) times daily. (6 week supply of sample given)   No facility-administered encounter medications on file as of 01/24/2017.     Functional Status:  In your present state of health, do you have any difficulty performing the following activities: 08/20/2016 05/31/2016  Hearing? N N  Vision? N N  Difficulty concentrating or making decisions? N N  Walking or climbing stairs? N N  Dressing or bathing? N N  Doing errands, shopping? N N  Preparing Food and eating ? N N  Using the Toilet? N N  In the past six months, have you accidently leaked urine? N N  Do you have problems with loss of bowel control? N N  Managing your Medications? N N  Managing your Finances? N N  Housekeeping or managing your Housekeeping? N N  Some recent data might be hidden    Fall/Depression Screening: PHQ 2/9 Scores 01/24/2017 12/23/2016 12/11/2016 11/14/2016 10/17/2016 09/27/2016 08/20/2016  PHQ - 2 Score 0 0 0 0 0 0 0  PHQ- 9 Score - 3 - - - - -    Assessment: Patient continues  to benefit from health coach outreach for disease management and support.   Plan:  [facility] CM Care Plan Problem One   Flowsheet Row Most Recent Value  Care Plan Problem One  Support Needs for Self Health Management CHF  Role Documenting the Problem One  Health Coach  Care Plan for Problem One  Active  THN Long Term Goal (31-90 days)  Over the next 31 days, patient will demonstrate and/or verbalize understanding of self health management for long term care of CHF  THN Long Term Goal Start Date  01/24/17 [goal continued]  Interventions for Problem One [place] reiterated with patient ways of monitoring heart failure, signs and symptoms, and when to  notify physician.      RN Health Coach will contact patient in the month of March and patient agrees to next outreach.  [name], RN, MSN [place] [phone]

## 2017-01-24 NOTE — Patient Outreach (Signed)
[facility]) Care Management  01/24/2017  Maria Fry 04-30-1951 [id number]   3rd telephone call to patient for monthly call.  No answer.  HIPAA compliant voice message left.  Plan: RN Health Coach will send a letter to attempt outreach.  [name], RN, MSN [place] [phone]

## 2017-01-27 DIAGNOSIS — I5040 Unspecified combined systolic (congestive) and diastolic (congestive) heart failure: Secondary | ICD-10-CM | POA: Diagnosis not present

## 2017-01-27 DIAGNOSIS — G4089 Other seizures: Secondary | ICD-10-CM | POA: Diagnosis not present

## 2017-01-27 DIAGNOSIS — R42 Dizziness and giddiness: Secondary | ICD-10-CM | POA: Diagnosis not present

## 2017-01-27 DIAGNOSIS — G5710 Meralgia paresthetica, unspecified lower limb: Secondary | ICD-10-CM | POA: Diagnosis not present

## 2017-01-27 DIAGNOSIS — R55 Syncope and collapse: Secondary | ICD-10-CM | POA: Diagnosis not present

## 2017-01-27 DIAGNOSIS — E559 Vitamin D deficiency, unspecified: Secondary | ICD-10-CM | POA: Diagnosis not present

## 2017-01-27 DIAGNOSIS — M79606 Pain in leg, unspecified: Secondary | ICD-10-CM | POA: Diagnosis not present

## 2017-01-27 DIAGNOSIS — R270 Ataxia, unspecified: Secondary | ICD-10-CM | POA: Diagnosis not present

## 2017-01-27 DIAGNOSIS — G629 Polyneuropathy, unspecified: Secondary | ICD-10-CM | POA: Diagnosis not present

## 2017-02-05 ENCOUNTER — Ambulatory Visit (INDEPENDENT_AMBULATORY_CARE_PROVIDER_SITE_OTHER): Payer: PPO | Admitting: Cardiovascular Disease

## 2017-02-05 ENCOUNTER — Encounter: Payer: Self-pay | Admitting: Cardiovascular Disease

## 2017-02-05 VITALS — BP 124/78 | HR 65 | Ht 66.0 in | Wt 185.0 lb

## 2017-02-05 DIAGNOSIS — I38 Endocarditis, valve unspecified: Secondary | ICD-10-CM

## 2017-02-05 DIAGNOSIS — I252 Old myocardial infarction: Secondary | ICD-10-CM | POA: Diagnosis not present

## 2017-02-05 DIAGNOSIS — E784 Other hyperlipidemia: Secondary | ICD-10-CM

## 2017-02-05 DIAGNOSIS — I10 Essential (primary) hypertension: Secondary | ICD-10-CM

## 2017-02-05 DIAGNOSIS — I25119 Atherosclerotic heart disease of native coronary artery with unspecified angina pectoris: Secondary | ICD-10-CM

## 2017-02-05 DIAGNOSIS — I5042 Chronic combined systolic (congestive) and diastolic (congestive) heart failure: Secondary | ICD-10-CM | POA: Diagnosis not present

## 2017-02-05 DIAGNOSIS — E785 Hyperlipidemia, unspecified: Secondary | ICD-10-CM

## 2017-02-05 MED ORDER — RANOLAZINE ER 500 MG PO TB12: 500.0000 mg | ORAL_TABLET | Freq: Two times a day (BID) | ORAL | 3 refills | Status: DC

## 2017-02-05 MED ORDER — NITROGLYCERIN 0.4 MG SL SUBL: 0.4000 mg | SUBLINGUAL_TABLET | SUBLINGUAL | 3 refills | Status: DC | PRN

## 2017-02-05 NOTE — Progress Notes (Signed)
SUBJECTIVE: Pt presents for fu of chronic combined systolic and diastolic heart failure.  Echocardiogram 04/23/16 showed mildly reduced left ventricular systolic function, LVEF 70%, with no significant change from echo dated 06/06/15. There was moderate concentric LVH with grade 2 diastolic dysfunction and wall motion abnormalities c/w CAD, mild aortic, mild tricuspid, and moderate mitral regurgitation with severe left atrial dilatation.  She is struggling with bronchitis. She denies chest pain and has not had to use nitroglycerin recently.  12/23/16: Total cholesterol 119, triglycerides 55, HDL 52, LDL 56, HbA1c 5.2%.   Review of Systems: As per "subjective", otherwise negative.  Allergies  Allergen Reactions  . Vicodin [Hydrocodone-Acetaminophen]     Severe nausea and diarrhea   . Demerol [Meperidine] Nausea And Vomiting  . Lisinopril     Throat swollen  . Lodine [Etodolac] Rash    Current Outpatient Prescriptions  Medication Sig Dispense Refill  . acetaminophen (TYLENOL) 500 MG tablet Take 500 mg by mouth every 6 (six) hours as needed for mild pain or moderate pain. Reported on 04/22/2016    . aspirin 81 MG chewable tablet Chew 1 tablet (81 mg total) by mouth daily.    Marland Kitchen atorvastatin (LIPITOR) 80 MG tablet Take 1 tablet (80 mg total) by mouth daily at 6 PM. 30 tablet 12  . clopidogrel (PLAVIX) 75 MG tablet TAKE ONE TABLET BY MOUTH ONCE DAILY 30 tablet 3  . furosemide (LASIX) 40 MG tablet Take 1 tablet (40 mg total) by mouth daily. 30 tablet 11  . gabapentin (NEURONTIN) 300 MG capsule Take 300 mg by mouth at bedtime.     Marland Kitchen levothyroxine (SYNTHROID, LEVOTHROID) 100 MCG tablet TAKE ONE TABLET BY MOUTH ONCE DAILY BEFORE BREAKFAST 90 tablet 1  . loratadine (CLARITIN) 10 MG tablet Take 10 mg by mouth daily as needed for allergies.    Marland Kitchen meclizine (ANTIVERT) 12.5 MG tablet Take 1 tablet (12.5 mg total) by mouth 3 (three) times daily as needed for dizziness. 30 tablet 0  . metoprolol  tartrate (LOPRESSOR) 25 MG tablet Take 1 tablet (25 mg total) by mouth 2 (two) times daily. 60 tablet 6  . nitroGLYCERIN (NITROSTAT) 0.4 MG SL tablet Place 1 tablet (0.4 mg total) under the tongue every 5 (five) minutes as needed for chest pain. 25 tablet 3  . potassium chloride SA (KLOR-CON M15) 15 MEQ tablet Take 1 tablet (15 mEq total) by mouth 2 (two) times daily. 60 tablet 11  . ranolazine (RANEXA) 500 MG 12 hr tablet Take 1 tablet (500 mg total) by mouth 2 (two) times daily. (6 week supply of sample given) 180 tablet 3   No current facility-administered medications for this visit.     Past Medical History:  Diagnosis Date  . Acute on chronic combined systolic (congestive) and diastolic (congestive) heart failure 04/23/2016.   EF 45% and grade 2 diastolic dysfunction.  Marland Kitchen CAD (coronary artery disease)    a. cath 06/07/15 Dist LAD 100%, mid LAD 10%.  . Essential hypertension   . Hip pain   . Hyperlipidemia   . Hypothyroidism   . MI (myocardial infarction) 06/05/2015  . Vitamin D deficiency     Past Surgical History:  Procedure Laterality Date  . ABDOMINAL HYSTERECTOMY    . CARDIAC CATHETERIZATION N/A 06/06/2015   Procedure: Left Heart Cath and Coronary Angiography;  Surgeon: Peter M Martinique, MD;  Location: [place] [facility];  Service: Cardiovascular;  Laterality: N/A;  . CHOLECYSTECTOMY    . ESOPHAGOGASTRODUODENOSCOPY N/A  09/11/2016   Procedure: ESOPHAGOGASTRODUODENOSCOPY (EGD);  Surgeon: Daneil Dolin, MD;  [facility];  Service: Endoscopy;  Laterality: N/A;  7:45 am - moved to 10/11 @ 10:30  . Heel spurs    . MALONEY DILATION N/A 09/11/2016   Procedure: Venia Minks DILATION;  Surgeon: Daneil Dolin, MD;  [facility];  Service: Endoscopy;  Laterality: N/A;  . SHOULDER ARTHROSCOPY    . TUBAL LIGATION      Social History   Social History  . Marital status: Single    Spouse name: N/A  . Number of children: N/A  . Years of education: N/A   Occupational  History  . Not on file.   Social History Main Topics  . Smoking status: Former Smoker    Types: Cigarettes    Start date: 12/02/1977    Quit date: 12/02/2002  . Smokeless tobacco: Never Used  . Alcohol use No  . Drug use: No  . Sexual activity: Yes   Other Topics Concern  . Not on file   Social History Narrative  . No narrative on file     Vitals:   02/05/17 0935  BP: 124/78  Pulse: 65  SpO2: 98%  Weight: 185 lb (83.9 kg)  Height: 5\' 6"  (1.676 m)    PHYSICAL EXAM General: NAD HEENT: Normal. Neck: No JVD, no thyromegaly. Lungs: Clear to auscultation bilaterally with normal respiratory effort. CV: Nondisplaced PMI.  Regular rate and rhythm, normal S1/S2, no S3/S4, no murmur. No pretibial or periankle edema.  No carotid bruit.   Abdomen: Soft, nontender, no distention.  Neurologic: Alert and oriented.  Psych: Normal affect. Skin: Normal. Musculoskeletal: No gross deformities.    ECG: Most recent ECG reviewed.      ASSESSMENT AND PLAN: 1. CAD with NSTEMI and distal 100% LAD stenosis: Symptomatically stable. Continue aspirin, Lipitor, Ranexa, and metoprolol. Allergic to ACEI. I will dc Plavix.  2. Essential HTN: Controlled on metoprolol. No changes.   3. Dyslipidemia: Continue high intensity statin therapy with Lipitor 80 mg daily. 12/23/16: Total cholesterol 119, triglycerides 55, HDL 52, LDL 56.  4. Chronic systolic and diastolic heart failure: Euvolemic on Lasix 40 mg daily. No changes.  5. Valvular heart disease: Will monitor with surveillance echocardiography.  Dispo: fu 6 months.   Kate Sable, M.D., F.A.C.C.

## 2017-02-05 NOTE — Patient Instructions (Signed)
Medication Instructions:  STOP PLAVIX I refilled Ranexa ( 3 months ) , Nitro   Labwork: none  Testing/Procedures: none  Follow-Up: Your physician wants you to follow-up in: 6 months  You will receive a reminder letter in the mail two months in advance. If you don't receive a letter, please call our office to schedule the follow-up appointment.   Any Other Special Instructions Will Be Listed Below (If Applicable).     If you need a refill on your cardiac medications before your next appointment, please call your pharmacy.

## 2017-02-20 ENCOUNTER — Other Ambulatory Visit: Payer: Self-pay

## 2017-02-20 NOTE — Patient Outreach (Signed)
[facility]) Care Management  [place]  02/20/2017   Maria Fry 1951-06-13 [id number]  Subjective: Telephone call to patient for monthly call.  Patient reports that she is doing good hanging in there.  Patient reports that she is no longer on lasix as the heart doctor discontinued it.   Patient reports that her weight is still 180 lbs.  Discussed with patient the importance of continuing to manage her heart failure by continuing to weigh daily, monitoring for swelling and shortness of breath, and low salt diet being that she is no longer on the lasix.  She verbalized understanding.  No concerns from patient.    Objective:   Encounter Medications:  Outpatient Encounter Prescriptions as of 02/20/2017  Medication Sig Note  . acetaminophen (TYLENOL) 500 MG tablet Take 500 mg by mouth every 6 (six) hours as needed for mild pain or moderate pain. Reported on 04/22/2016   . aspirin 81 MG chewable tablet Chew 1 tablet (81 mg total) by mouth daily.   Marland Kitchen atorvastatin (LIPITOR) 80 MG tablet Take 1 tablet (80 mg total) by mouth daily at 6 PM.   . gabapentin (NEURONTIN) 300 MG capsule Take 300 mg by mouth at bedtime.    Marland Kitchen levothyroxine (SYNTHROID, LEVOTHROID) 100 MCG tablet TAKE ONE TABLET BY MOUTH ONCE DAILY BEFORE BREAKFAST   . loratadine (CLARITIN) 10 MG tablet Take 10 mg by mouth daily as needed for allergies.   Marland Kitchen meclizine (ANTIVERT) 12.5 MG tablet Take 1 tablet (12.5 mg total) by mouth 3 (three) times daily as needed for dizziness.   . metoprolol tartrate (LOPRESSOR) 25 MG tablet Take 1 tablet (25 mg total) by mouth 2 (two) times daily.   . nitroGLYCERIN (NITROSTAT) 0.4 MG SL tablet Place 1 tablet (0.4 mg total) under the tongue every 5 (five) minutes as needed for chest pain.   . potassium chloride SA (KLOR-CON M15) 15 MEQ tablet Take 1 tablet (15 mEq total) by mouth 2 (two) times daily.   . ranolazine (RANEXA) 500 MG 12 hr tablet Take 1 tablet (500 mg total) by mouth 2  (two) times daily. (6 week supply of sample given)   . furosemide (LASIX) 40 MG tablet Take 1 tablet (40 mg total) by mouth daily. (Patient not taking: Reported on 02/20/2017) 02/20/2017: MD Discontinued   No facility-administered encounter medications on file as of 02/20/2017.     Functional Status:  In your present state of health, do you have any difficulty performing the following activities: 08/20/2016 05/31/2016  Hearing? N N  Vision? N N  Difficulty concentrating or making decisions? N N  Walking or climbing stairs? N N  Dressing or bathing? N N  Doing errands, shopping? N N  Preparing Food and eating ? N N  Using the Toilet? N N  In the past six months, have you accidently leaked urine? N N  Do you have problems with loss of bowel control? N N  Managing your Medications? N N  Managing your Finances? N N  Housekeeping or managing your Housekeeping? N N  Some recent data might be hidden    Fall/Depression Screening: PHQ 2/9 Scores 02/20/2017 01/24/2017 12/23/2016 12/11/2016 11/14/2016 10/17/2016 09/27/2016  PHQ - 2 Score 0 0 0 0 0 0 0  PHQ- 9 Score - - 3 - - - -    Assessment: Patient continues to benefit from health coach outreach for disease management and support.    Plan:  [facility] CM Care Plan Problem One  Most Recent Value  Care Plan Problem One  Support Needs for Self Health Management CHF  Role Documenting the Problem One  Health Coach  Care Plan for Problem One  Active  THN Long Term Goal (31-90 days)  Over the next 31 days, patient will demonstrate and/or verbalize understanding of self health management for long term care of CHF  THN Long Term Goal Start Date  02/20/17 [goal continued]  Interventions for Problem One [place] reviewed with patient ways of monitoring heart failure, signs and symptoms, and when to notify physician.      RN Health Coach will contact patient in the month of April and patient agrees to next outreach.  [name], RN, MSN [place] [phone]

## 2017-03-05 ENCOUNTER — Other Ambulatory Visit: Payer: Self-pay | Admitting: Cardiovascular Disease

## 2017-03-06 ENCOUNTER — Other Ambulatory Visit: Payer: Self-pay | Admitting: Cardiovascular Disease

## 2017-03-20 ENCOUNTER — Other Ambulatory Visit: Payer: Self-pay

## 2017-03-20 NOTE — Patient Outreach (Signed)
[facility]) Care Management  [place]  03/20/2017   Maria Fry 02-28-1951 [id number]  Subjective: Telephone call to patient for monthly call. Patient reports that she is doing good.  She reports that her weight today was 179 lbs.  Patient denies any problems with swelling or shortness of breath.  Patient reports that she continues to be active with the [facility] and the senior games.  Patient reports that she received some information from her pharmacy asking her to talk with physician about Ranexa and that she is not to take it regularly. Patient reports she is still taking it as it helps with her chest pain.  Patient asked if she should call her physician.  Advised patient that she should call her physician to discuss with him for his input.  She verbalized understanding.  Reviewed heart failure zones with patient and she verbalized understanding.     Objective:   Encounter Medications:  Outpatient Encounter Prescriptions as of 03/20/2017  Medication Sig Note  . acetaminophen (TYLENOL) 500 MG tablet Take 500 mg by mouth every 6 (six) hours as needed for mild pain or moderate pain. Reported on 04/22/2016   . aspirin 81 MG chewable tablet Chew 1 tablet (81 mg total) by mouth daily.   Marland Kitchen atorvastatin (LIPITOR) 80 MG tablet Take 1 tablet (80 mg total) by mouth daily at 6 PM.   . clopidogrel (PLAVIX) 75 MG tablet TAKE ONE TABLET BY MOUTH ONCE DAILY   . gabapentin (NEURONTIN) 300 MG capsule Take 300 mg by mouth at bedtime.    Marland Kitchen levothyroxine (SYNTHROID, LEVOTHROID) 100 MCG tablet TAKE ONE TABLET BY MOUTH ONCE DAILY BEFORE BREAKFAST   . loratadine (CLARITIN) 10 MG tablet Take 10 mg by mouth daily as needed for allergies.   . metoprolol tartrate (LOPRESSOR) 25 MG tablet Take 1 tablet (25 mg total) by mouth 2 (two) times daily.   . nitroGLYCERIN (NITROSTAT) 0.4 MG SL tablet Place 1 tablet (0.4 mg total) under the tongue every 5 (five) minutes as needed for chest pain.   .  potassium chloride SA (KLOR-CON M15) 15 MEQ tablet Take 1 tablet (15 mEq total) by mouth 2 (two) times daily.   . ranolazine (RANEXA) 500 MG 12 hr tablet Take 1 tablet (500 mg total) by mouth 2 (two) times daily. (6 week supply of sample given)   . furosemide (LASIX) 40 MG tablet Take 1 tablet (40 mg total) by mouth daily. (Patient not taking: Reported on 02/20/2017) 02/20/2017: MD Discontinued  . meclizine (ANTIVERT) 12.5 MG tablet Take 1 tablet (12.5 mg total) by mouth 3 (three) times daily as needed for dizziness. (Patient not taking: Reported on 03/20/2017)    No facility-administered encounter medications on file as of 03/20/2017.     Functional Status:  In your present state of health, do you have any difficulty performing the following activities: 08/20/2016 05/31/2016  Hearing? N N  Vision? N N  Difficulty concentrating or making decisions? N N  Walking or climbing stairs? N N  Dressing or bathing? N N  Doing errands, shopping? N N  Preparing Food and eating ? N N  Using the Toilet? N N  In the past six months, have you accidently leaked urine? N N  Do you have problems with loss of bowel control? N N  Managing your Medications? N N  Managing your Finances? N N  Housekeeping or managing your Housekeeping? N N  Some recent data might be hidden  Fall/Depression Screening: Fall Risk  03/20/2017 02/20/2017 01/24/2017  Falls in the past year? No No No  Number falls in past yr: - - -  Injury with Fall? - - -  Risk Factor Category  - - -  Risk for fall due to : - - -  Follow up - - -   PHQ 2/9 Scores 03/20/2017 02/20/2017 01/24/2017 12/23/2016 12/11/2016 11/14/2016 10/17/2016  PHQ - 2 Score 0 0 0 0 0 0 0  PHQ- 9 Score - - - 3 - - -    Assessment: Patient continues to benefit from health coach outreach for disease management and support.    Plan:  [facility] CM Care Plan Problem One     Most Recent Value  Care Plan Problem One  Support Needs for Self Health Management CHF  Role  Documenting the Problem One  Health Coach  Care Plan for Problem One  Active  THN Long Term Goal (31-90 days)  Over the next 31 days, patient will demonstrate and/or verbalize understanding of self health management for long term care of CHF  THN Long Term Goal Start Date  03/20/17 [goal continued]  Interventions for Problem One [place] discussed with patient ways of monitoring heart failure, signs and symptoms, and when to notify physician.      RN Health Coach will contact patient in the month of May and patient agrees to next outreach.  [name], RN, MSN [place] [phone]

## 2017-04-02 ENCOUNTER — Other Ambulatory Visit: Payer: Self-pay | Admitting: Family Medicine

## 2017-04-08 ENCOUNTER — Other Ambulatory Visit: Payer: Self-pay

## 2017-04-08 NOTE — Patient Outreach (Signed)
[facility]) Care Management  04/08/2017  Maria Fry 26-Jun-1951 [id number]   Telephone call to patient for monthly call.  No answer. HIPAA compliant voice message left.  Plan:RN Health Coach will attempt patient again in the month of May.   [name], RN, MSN [place] [phone]

## 2017-04-29 ENCOUNTER — Other Ambulatory Visit: Payer: Self-pay

## 2017-04-29 NOTE — Patient Outreach (Signed)
[facility]) Care Management  04/29/2017  Maria Fry 06-29-1951 [id number]   2nd telephone call to patient for monthly call.  No answer.  HIPAA compliant voice message left.   Plan: RN Health Coach will attempt patient in the month of June.  [name], RN, MSN [place] [phone]

## 2017-05-01 ENCOUNTER — Other Ambulatory Visit: Payer: Self-pay

## 2017-05-01 NOTE — Patient Outreach (Signed)
[place] [facility]) Care Management  [place]  05/01/2017   DA MICHELLE 10-Jul-1951 [id number]  Subjective: Incoming call from patient for monthly call.  Patient reports she is doing good.  Remaining active.  Patient reports that her weight remains around 179-180.  Discussed with patient heart failure symptoms and when to seek help.  She verbalized understanding.   Objective:   Encounter Medications:  Outpatient Encounter Prescriptions as of 05/01/2017  Medication Sig Note  . acetaminophen (TYLENOL) 500 MG tablet Take 500 mg by mouth every 6 (six) hours as needed for mild pain or moderate pain. Reported on 04/22/2016   . aspirin 81 MG chewable tablet Chew 1 tablet (81 mg total) by mouth daily.   Marland Kitchen atorvastatin (LIPITOR) 80 MG tablet Take 1 tablet (80 mg total) by mouth daily at 6 PM.   . gabapentin (NEURONTIN) 300 MG capsule Take 300 mg by mouth at bedtime.    Marland Kitchen levothyroxine (SYNTHROID, LEVOTHROID) 100 MCG tablet TAKE ONE TABLET BY MOUTH ONCE DAILY BEFORE BREAKFAST   . loratadine (CLARITIN) 10 MG tablet Take 10 mg by mouth daily as needed for allergies.   . metoprolol tartrate (LOPRESSOR) 25 MG tablet TAKE ONE TABLET BY MOUTH TWICE DAILY **NEW DOSE**   . nitroGLYCERIN (NITROSTAT) 0.4 MG SL tablet Place 1 tablet (0.4 mg total) under the tongue every 5 (five) minutes as needed for chest pain.   . potassium chloride SA (KLOR-CON M15) 15 MEQ tablet Take 1 tablet (15 mEq total) by mouth 2 (two) times daily.   . ranolazine (RANEXA) 500 MG 12 hr tablet Take 1 tablet (500 mg total) by mouth 2 (two) times daily. (6 week supply of sample given)   . clopidogrel (PLAVIX) 75 MG tablet TAKE ONE TABLET BY MOUTH ONCE DAILY (Patient not taking: Reported on 05/01/2017) 05/01/2017: MD discontinued  . furosemide (LASIX) 40 MG tablet Take 1 tablet (40 mg total) by mouth daily. (Patient not taking: Reported on 02/20/2017) 02/20/2017: MD Discontinued  . meclizine (ANTIVERT) 12.5 MG tablet Take 1  tablet (12.5 mg total) by mouth 3 (three) times daily as needed for dizziness. (Patient not taking: Reported on 03/20/2017)    No facility-administered encounter medications on file as of 05/01/2017.     Functional Status:  In your present state of health, do you have any difficulty performing the following activities: 08/20/2016 05/31/2016  Hearing? N N  Vision? N N  Difficulty concentrating or making decisions? N N  Walking or climbing stairs? N N  Dressing or bathing? N N  Doing errands, shopping? N N  Preparing Food and eating ? N N  Using the Toilet? N N  In the past six months, have you accidently leaked urine? N N  Do you have problems with loss of bowel control? N N  Managing your Medications? N N  Managing your Finances? N N  Housekeeping or managing your Housekeeping? N N  Some recent data might be hidden    Fall/Depression Screening: Fall Risk  05/01/2017 03/20/2017 02/20/2017  Falls in the past year? No No No  Number falls in past yr: - - -  Injury with Fall? - - -  Risk Factor Category  - - -  Risk for fall due to : - - -  Follow up - - -   PHQ 2/9 Scores 05/01/2017 03/20/2017 02/20/2017 01/24/2017 12/23/2016 12/11/2016 11/14/2016  PHQ - 2 Score 0 0 0 0 0 0 0  PHQ- 9 Score - - - -  3 - -    Assessment: Patient continues to benefit from health coach outreach for disease management and support.    Plan:  [facility] CM Care Plan Problem One     Most Recent Value  Care Plan Problem One  Support Needs for Self Health Management CHF  Role Documenting the Problem One  Health Coach  Care Plan for Problem One  Active  THN Long Term Goal   Over the next 31 days, patient will demonstrate and/or verbalize understanding of self health management for long term care of CHF  THN Long Term Goal Start Date  05/01/17 [goal continued]  Interventions for Problem One [place] reviewed with patient ways of monitoring heart failure, signs and symptoms, and when to notify  physician.      RN Health Coach will contact patient in the month of June and patient agrees to next outreach.  [name], RN, MSN [place] [phone]

## 2017-05-05 ENCOUNTER — Ambulatory Visit: Payer: Self-pay

## 2017-05-07 ENCOUNTER — Ambulatory Visit: Payer: Self-pay

## 2017-05-26 ENCOUNTER — Other Ambulatory Visit: Payer: Self-pay

## 2017-05-26 NOTE — Patient Outreach (Signed)
[facility]) Care Management  05/26/2017  SAVANHA ISLAND 21-Oct-1951 [id number]   Telephone call to patient for monthly call.  No answer.  HIPAA compliant voice message left.    Plan: RN Health Coach will attempt patient again in the month of June.   [name], RN, MSN [place] [phone]

## 2017-05-29 ENCOUNTER — Other Ambulatory Visit: Payer: Self-pay

## 2017-05-29 NOTE — Patient Outreach (Signed)
[place] [facility]) Care Management  05/29/2017  Maria Fry 06-07-1951 [id number]   Telephone call to patient for monthly outreach.  No answer.  HIPAA compliant voice message left.  Plan: RN Health Coach will attempt patient again in the month of July.  [name], RN, MSN [place] [phone]

## 2017-05-30 ENCOUNTER — Other Ambulatory Visit: Payer: Self-pay

## 2017-05-30 NOTE — Patient Outreach (Signed)
[facility] [facility]) Care Management  [place]  05/30/2017   Maria Fry 1951/07/09 [id number]  Subjective: Return call to patient for monthly call.  Patient reports she is doing good.  Patient reports weight staying 179 -180 lbs.  Patient continues to watch her salt intake and remain active.  Discussed with patient signs and symptoms of heart failure and importance of notifying physician.  She verbalized understanding.   Objective:   Encounter Medications:  Outpatient Encounter Prescriptions as of 05/30/2017  Medication Sig Note  . acetaminophen (TYLENOL) 500 MG tablet Take 500 mg by mouth every 6 (six) hours as needed for mild pain or moderate pain. Reported on 04/22/2016   . aspirin 81 MG chewable tablet Chew 1 tablet (81 mg total) by mouth daily.   Marland Kitchen atorvastatin (LIPITOR) 80 MG tablet Take 1 tablet (80 mg total) by mouth daily at 6 PM.   . gabapentin (NEURONTIN) 300 MG capsule Take 300 mg by mouth at bedtime.    Marland Kitchen levothyroxine (SYNTHROID, LEVOTHROID) 100 MCG tablet TAKE ONE TABLET BY MOUTH ONCE DAILY BEFORE BREAKFAST   . loratadine (CLARITIN) 10 MG tablet Take 10 mg by mouth daily as needed for allergies.   . metoprolol tartrate (LOPRESSOR) 25 MG tablet TAKE ONE TABLET BY MOUTH TWICE DAILY **NEW DOSE**   . nitroGLYCERIN (NITROSTAT) 0.4 MG SL tablet Place 1 tablet (0.4 mg total) under the tongue every 5 (five) minutes as needed for chest pain.   . potassium chloride SA (KLOR-CON M15) 15 MEQ tablet Take 1 tablet (15 mEq total) by mouth 2 (two) times daily.   . ranolazine (RANEXA) 500 MG 12 hr tablet Take 1 tablet (500 mg total) by mouth 2 (two) times daily. (6 week supply of sample given)   . clopidogrel (PLAVIX) 75 MG tablet TAKE ONE TABLET BY MOUTH ONCE DAILY (Patient not taking: Reported on 05/01/2017) 05/01/2017: MD discontinued  . furosemide (LASIX) 40 MG tablet Take 1 tablet (40 mg total) by mouth daily. (Patient not taking: Reported on 02/20/2017) 02/20/2017: MD  Discontinued  . meclizine (ANTIVERT) 12.5 MG tablet Take 1 tablet (12.5 mg total) by mouth 3 (three) times daily as needed for dizziness. (Patient not taking: Reported on 03/20/2017)    No facility-administered encounter medications on file as of 05/30/2017.     Functional Status:  In your present state of health, do you have any difficulty performing the following activities: 08/20/2016 05/31/2016  Hearing? N N  Vision? N N  Difficulty concentrating or making decisions? N N  Walking or climbing stairs? N N  Dressing or bathing? N N  Doing errands, shopping? N N  Preparing Food and eating ? N N  Using the Toilet? N N  In the past six months, have you accidently leaked urine? N N  Do you have problems with loss of bowel control? N N  Managing your Medications? N N  Managing your Finances? N N  Housekeeping or managing your Housekeeping? N N  Some recent data might be hidden    Fall/Depression Screening: Fall Risk  05/30/2017 05/01/2017 03/20/2017  Falls in the past year? No No No  Number falls in past yr: - - -  Injury with Fall? - - -  Risk Factor Category  - - -  Risk for fall due to : - - -  Follow up - - -   PHQ 2/9 Scores 05/30/2017 05/01/2017 03/20/2017 02/20/2017 01/24/2017 12/23/2016 12/11/2016  PHQ - 2 Score 0 0 0 0  0 0 0  PHQ- 9 Score - - - - - 3 -    Assessment: Patient continues to benefit from health coach outreach for disease management and support.   Plan:  [facility] CM Care Plan Problem One     Most Recent Value  Care Plan Problem One  Support Needs for Self Health Management CHF  Role Documenting the Problem One  Health Coach  Care Plan for Problem One  Active  THN Long Term Goal   Over the next 31 days, patient will demonstrate and/or verbalize understanding of self health management for long term care of CHF  THN Long Term Goal Start Date  05/30/17 [place] continued]  Interventions for Problem One Long Term Goal  RN Health Coach discussed with patient ways of monitoring  heart failure, signs and symptoms, and when to notify physician.      RN Health Coach will contact patient in the month of July and patient agrees to next outreach.  [name], RN, MSN [place] [phone]

## 2017-05-30 NOTE — Patient Outreach (Signed)
[facility]) Care Management  05/30/2017  Maria Fry 08/16/1951 [id number]   Return telephone call to patient.  No answer.  HIPAA compliant voice message left.    Plan: RN Health Coach will attempt patient again in the month of July.    [name], RN, MSN [place] [phone]

## 2017-06-03 ENCOUNTER — Other Ambulatory Visit: Payer: Self-pay | Admitting: "Endocrinology

## 2017-06-11 ENCOUNTER — Other Ambulatory Visit: Payer: Self-pay

## 2017-06-11 NOTE — Patient Outreach (Signed)
[facility]) Care Management  06/11/2017  Maria Fry 04/16/1951 [id number]   Telephone call to patient for monthly call.  No answer.  HIPAA compliant voice message left.  Plan: RN Health Coach will attempt patient again in the month of July.  [name], RN, MSN [place] [phone]

## 2017-06-16 ENCOUNTER — Other Ambulatory Visit: Payer: Self-pay

## 2017-06-16 NOTE — Patient Outreach (Signed)
[place] [facility]) Care Management  06/16/2017  Maria Fry [birth year] [id number]   2nd Telephone call to patient for monthly call.  No answer.  HIPAA compliant voice message left.  Plan: RN Health Coach will attempt patient again in the month of July.   [name], RN, MSN [place] [phone]

## 2017-06-18 ENCOUNTER — Other Ambulatory Visit: Payer: Self-pay

## 2017-06-18 NOTE — Patient Outreach (Signed)
[facility]) Care Management  Maria Fry  06/18/2017   Maria Fry 05-11-1951 [id number]  Subjective: Telephone call to patient for monthly call.  Patient reports she is doing good. Patient remains active in her community.  Patient reports that she sees her primary care physician on Monday.  Patient weight 180 lbs.  Patient denies shortness of breath or swelling.  Discussed with patient heart failure and when to notify physician.  She verbalized understanding.  Discussed with patient case closure on next call. Patient in agreement.  Objective:   Encounter Medications:  Outpatient Encounter Prescriptions as of 06/18/2017  Medication Sig Note  . acetaminophen (TYLENOL) 500 MG tablet Take 500 mg by mouth every 6 (six) hours as needed for mild pain or moderate pain. Reported on 04/22/2016   . aspirin 81 MG chewable tablet Chew 1 tablet (81 mg total) by mouth daily.   Marland Kitchen atorvastatin (LIPITOR) 80 MG tablet Take 1 tablet (80 mg total) by mouth daily at 6 PM.   . gabapentin (NEURONTIN) 300 MG capsule Take 300 mg by mouth at bedtime.    Marland Kitchen levothyroxine (SYNTHROID, LEVOTHROID) 100 MCG tablet take 1 tablet by mouth once daily BEFORE BREAKFAST   . loratadine (CLARITIN) 10 MG tablet Take 10 mg by mouth daily as needed for allergies.   . metoprolol tartrate (LOPRESSOR) 25 MG tablet TAKE ONE TABLET BY MOUTH TWICE DAILY **NEW DOSE**   . nitroGLYCERIN (NITROSTAT) 0.4 MG SL tablet Place 1 tablet (0.4 mg total) under the tongue every 5 (five) minutes as needed for chest pain.   . potassium chloride SA (KLOR-CON M15) 15 MEQ tablet Take 1 tablet (15 mEq total) by mouth 2 (two) times daily.   . ranolazine (RANEXA) 500 MG 12 hr tablet Take 1 tablet (500 mg total) by mouth 2 (two) times daily. (6 week supply of sample given)   . clopidogrel (PLAVIX) 75 MG tablet TAKE ONE TABLET BY MOUTH ONCE DAILY (Patient not taking: Reported on 05/01/2017) 05/01/2017: MD discontinued  . furosemide (LASIX) 40  MG tablet Take 1 tablet (40 mg total) by mouth daily. (Patient not taking: Reported on 02/20/2017) 02/20/2017: MD Discontinued  . meclizine (ANTIVERT) 12.5 MG tablet Take 1 tablet (12.5 mg total) by mouth 3 (three) times daily as needed for dizziness. (Patient not taking: Reported on 03/20/2017)    No facility-administered encounter medications on file as of 06/18/2017.     Functional Status:  In your present state of health, do you have any difficulty performing the following activities: 08/20/2016  Hearing? N  Vision? N  Difficulty concentrating or making decisions? N  Walking or climbing stairs? N  Dressing or bathing? N  Doing errands, shopping? N  Preparing Food and eating ? N  Using the Toilet? N  In the past six months, have you accidently leaked urine? N  Do you have problems with loss of bowel control? N  Managing your Medications? N  Managing your Finances? N  Housekeeping or managing your Housekeeping? N  Some recent data might be hidden    Fall/Depression Screening: Fall Risk  06/18/2017 05/30/2017 05/01/2017  Falls in the past year? No No No  Number falls in past yr: - - -  Injury with Fall? - - -  Risk Factor Category  - - -  Risk for fall due to : - - -  Follow up - - -   [facility] 2/9 Scores 06/18/2017 05/30/2017 05/01/2017 03/20/2017 02/20/2017 01/24/2017 12/23/2016  PHQ - 2  Score 0 0 0 0 0 0 0  PHQ- 9 Score - - - - - - 3    Assessment: Patient continues to benefit from health coach outreach for disease management and support.    Plan:  [facility] CM Care Plan Problem One     Most Recent Value  Care Plan Problem One  Support Needs for Self Health Management CHF  Role Documenting the Problem One  Health Coach  Care Plan for Problem One  Active  THN Long Term Goal   Over the next 31 days, patient will demonstrate and/or verbalize understanding of self health management for long term care of CHF  THN Long Term Goal Start Date  06/18/17 [goal continued]  Interventions for Problem  One [place] reviewed with patient ways of monitoring heart failure, signs and symptoms, and when to notify physician.      RN Health Coach will contact patient in the month of August and patient agrees to next outreach.  [name], RN, MSN [place] [phone]

## 2017-06-23 ENCOUNTER — Other Ambulatory Visit: Payer: Self-pay

## 2017-06-23 ENCOUNTER — Ambulatory Visit (INDEPENDENT_AMBULATORY_CARE_PROVIDER_SITE_OTHER): Payer: PPO | Admitting: Family Medicine

## 2017-06-23 ENCOUNTER — Encounter: Payer: Self-pay | Admitting: Family Medicine

## 2017-06-23 VITALS — BP 136/80 | HR 66 | Temp 97.5°F | Resp 14 | Ht 66.0 in | Wt 196.0 lb

## 2017-06-23 DIAGNOSIS — R7302 Impaired glucose tolerance (oral): Secondary | ICD-10-CM

## 2017-06-23 DIAGNOSIS — I25118 Atherosclerotic heart disease of native coronary artery with other forms of angina pectoris: Secondary | ICD-10-CM | POA: Diagnosis not present

## 2017-06-23 DIAGNOSIS — I5042 Chronic combined systolic (congestive) and diastolic (congestive) heart failure: Secondary | ICD-10-CM

## 2017-06-23 DIAGNOSIS — T148XXA Other injury of unspecified body region, initial encounter: Secondary | ICD-10-CM | POA: Diagnosis not present

## 2017-06-23 DIAGNOSIS — I10 Essential (primary) hypertension: Secondary | ICD-10-CM

## 2017-06-23 LAB — CBC WITH DIFFERENTIAL/PLATELET
Basophils Absolute: 0 cells/uL (ref 0–200)
Basophils Relative: 0 %
Eosinophils Absolute: 152 cells/uL (ref 15–500)
Eosinophils Relative: 2 %
HCT: 36.2 % (ref 35.0–45.0)
Hemoglobin: 11.7 g/dL — ABNORMAL LOW (ref 12.0–15.0)
Lymphocytes Relative: 32 %
Lymphs Abs: 2432 cells/uL (ref 850–3900)
MCH: 30.8 pg (ref 27.0–33.0)
MCHC: 32.3 g/dL (ref 32.0–36.0)
MCV: 95.3 fL (ref 80.0–100.0)
MPV: 10.4 fL (ref 7.5–12.5)
Monocytes Absolute: 608 cells/uL (ref 200–950)
Monocytes Relative: 8 %
Neutro Abs: 4408 cells/uL (ref 1500–7800)
Neutrophils Relative %: 58 %
Platelets: 263 10*3/uL (ref 140–400)
RBC: 3.8 MIL/uL (ref 3.80–5.10)
RDW: 15.3 % — ABNORMAL HIGH (ref 11.0–15.0)
WBC: 7.6 10*3/uL (ref 3.8–10.8)

## 2017-06-23 LAB — COMPREHENSIVE METABOLIC PANEL
ALT: 12 U/L (ref 6–29)
AST: 17 U/L (ref 10–35)
Albumin: 3.6 g/dL (ref 3.6–5.1)
Alkaline Phosphatase: 70 U/L (ref 33–130)
BUN: 13 mg/dL (ref 7–25)
CO2: 22 mmol/L (ref 20–31)
Calcium: 8.4 mg/dL — ABNORMAL LOW (ref 8.6–10.4)
Chloride: 105 mmol/L (ref 98–110)
Creat: 0.98 mg/dL (ref 0.50–0.99)
Glucose, Bld: 82 mg/dL (ref 70–99)
Potassium: 4.5 mmol/L (ref 3.5–5.3)
Sodium: 136 mmol/L (ref 135–146)
Total Bilirubin: 0.4 mg/dL (ref 0.2–1.2)
Total Protein: 6.9 g/dL (ref 6.1–8.1)

## 2017-06-23 LAB — LIPID PANEL
Cholesterol: 112 mg/dL (ref <200)
HDL: 50 mg/dL — ABNORMAL LOW (ref >50)
LDL Cholesterol: 50 mg/dL (ref <100)
Total CHOL/HDL Ratio: 2.2 Ratio (ref <5.0)
Triglycerides: 61 mg/dL (ref <150)
VLDL: 12 mg/dL (ref <30)

## 2017-06-23 NOTE — Assessment & Plan Note (Signed)
She is currently compensated with regards to her fluid status. I did reach [facility] to [facility]. Hopefully they can get her a new scale at home. Discussed the dietary changes that need to be made to get her weight back down. She will also continue with the [employer] in her exercise program.  With regards to the bruising and I think this is secondary to being on both Plavix and aspirin but both are needed in the setting of her heart disease. I will check her platelet and CBC but no sign of any acute bleeding

## 2017-06-23 NOTE — Progress Notes (Signed)
   Subjective:    Patient ID: Maria Fry, female    DOB: 06/10/1951, 66 y.o.   MRN: [id number]  Patient presents for 6 month F/U (is not fasting)  Patient to follow chronic medical problems. She still followed by cardiology as well as primary healthcare network for her congestive heart failure.Denies any chest pain shortness of breath. She is taking off her medications as prescribed No recent medication changes.  Followed by endocrinology for her hyperthyroidism reviewed last note Synthroid was increased to 100 g in February her next Follow-up is in August.  BorderlineDiabetes mellitus diet-controlled last A1c 5.2% 6 months ago along with lipid panel which was at goal less than 70  Medications reviewed  Weight in Jan 186, today 196, she admits to eating the wrong foods, she still goes to the [facility] a few days a week   Ate cheerios and coffee this morning   She has noted easy bruising she is on Plavix and aspirin denies any blood in her stool or urine no nosebleeds  Review Of Systems:  GEN- denies fatigue, fever, weight loss,weakness, recent illness HEENT- denies eye drainage, change in vision, nasal discharge, CVS- denies chest pain, palpitations RESP- denies SOB, cough, wheeze ABD- denies N/V, change in stools, abd pain GU- denies dysuria, hematuria, dribbling, incontinence MSK- denies joint pain, muscle aches, injury Neuro- denies headache, dizziness, syncope, seizure activity       Objective:    BP 136/80   Pulse 66   Temp (!) 97.5 F (36.4 C) (Oral)   Resp 14   Ht 5\' 6"  (1.676 m)   Wt 196 lb (88.9 kg)   SpO2 98%   BMI 31.64 kg/m  GEN- NAD, alert and oriented x3 HEENT- PERRL, EOMI, non injected sclera, pink conjunctiva, MMM, oropharynx clear Neck- Supple, no JVD CVS- RRR, soft systolic heart murmur RESP-CTAB ABD-NABS,soft,NT,ND EXT- No edema,varicose veins  Pulses- Radial, DP- 2+        Assessment & Plan:      Problem List Items Addressed This Visit    CAD (coronary artery disease)   Relevant Orders   Lipid panel   Hypertension - Primary    Blood pressure is controlled      Relevant Orders   Comprehensive metabolic panel   CBC with Differential/Platelet   Glucose intolerance (impaired glucose tolerance)    Recheck A1c as well as her lipid panel concerned she has gained 10 pounds.      Relevant Orders   Hemoglobin A1c   Chronic combined systolic and diastolic CHF (congestive heart failure) ([place])    She is currently compensated with regards to her fluid status. I did reach [facility]. Hopefully they can get her a new scale at home. Discussed the dietary changes that need to be made to get her weight back down. She will also continue with the [employer] in her exercise program.  With regards to the bruising and I think this is secondary to being on both Plavix and aspirin but both are needed in the setting of her heart disease. I will check her platelet and CBC but no sign of any acute bleeding       Other Visit Diagnoses    Bruising          Note: This dictation was prepared with Dragon dictation along with smaller phrase technology. Any transcriptional errors that result from this process are unintentional.

## 2017-06-23 NOTE — Assessment & Plan Note (Signed)
Recheck A1c as well as her lipid panel concerned she has gained 10 pounds.

## 2017-06-23 NOTE — Assessment & Plan Note (Signed)
Blood pressure is controlled

## 2017-06-23 NOTE — Patient Instructions (Addendum)
F/U Jan for Physical  We will call with lab results  Bruising due to the plavix and aspirin, any little injury body or knocking into anything will cause the bruising to come up, this is normal with these medications, but she needs them due to her heart

## 2017-06-24 LAB — HEMOGLOBIN A1C
Hgb A1c MFr Bld: 5.3 % (ref <5.7)
Mean Plasma Glucose: 105 mg/dL

## 2017-06-25 ENCOUNTER — Ambulatory Visit: Payer: Self-pay

## 2017-06-26 ENCOUNTER — Other Ambulatory Visit: Payer: Self-pay | Admitting: "Endocrinology

## 2017-06-26 ENCOUNTER — Encounter: Payer: Self-pay | Admitting: *Deleted

## 2017-06-26 DIAGNOSIS — E890 Postprocedural hypothyroidism: Secondary | ICD-10-CM | POA: Diagnosis not present

## 2017-06-26 LAB — TSH: TSH: 2.53 mIU/L

## 2017-06-26 LAB — T4, FREE: Free T4: 1.4 ng/dL (ref 0.8–1.8)

## 2017-06-27 IMAGING — MG 2D DIGITAL SCREENING BILATERAL MAMMOGRAM WITH CAD AND ADJUNCT TO
8 of 11 series · 8 of 27 positions shown · non-contrast
Comparison: Previous exam(s).

CLINICAL DATA: Screening.

EXAM:
2D DIGITAL SCREENING BILATERAL MAMMOGRAM WITH CAD AND ADJUNCT TOMO

[R MLO (1 of 2)]
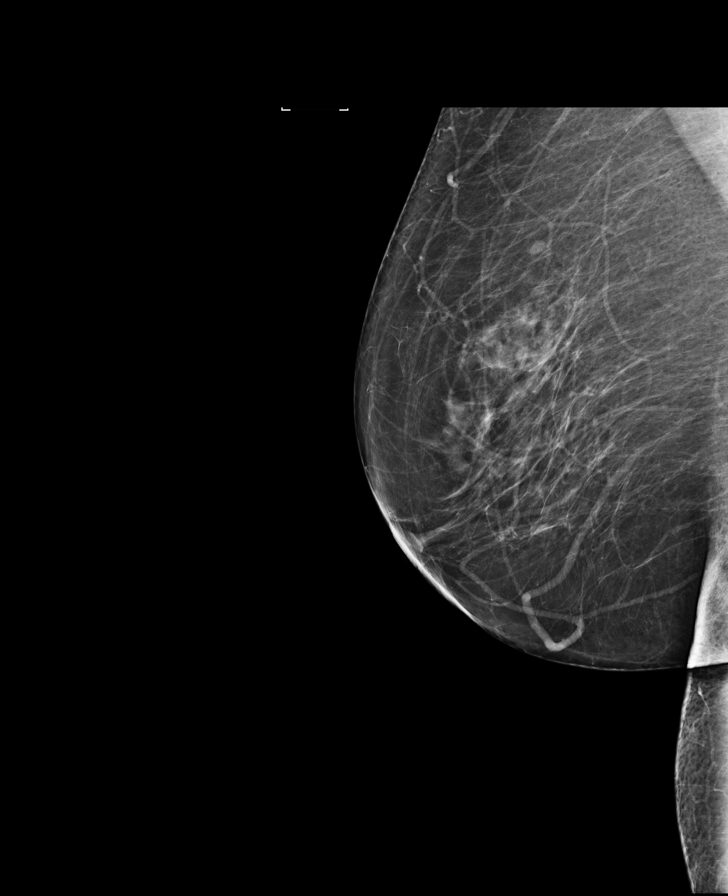

[L MLO (1 of 2)]
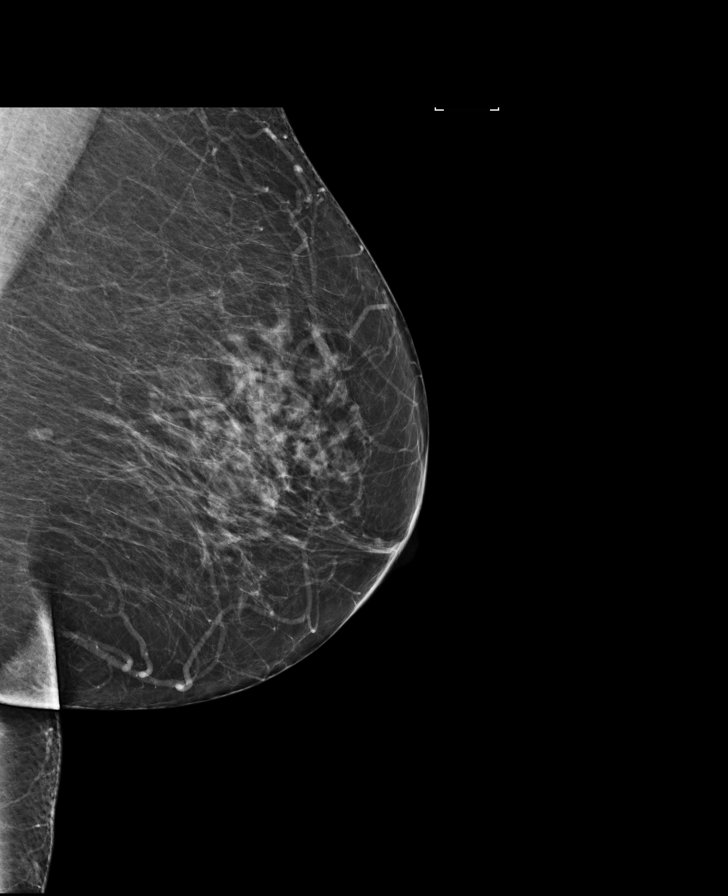

[L CC (1 of 2)]
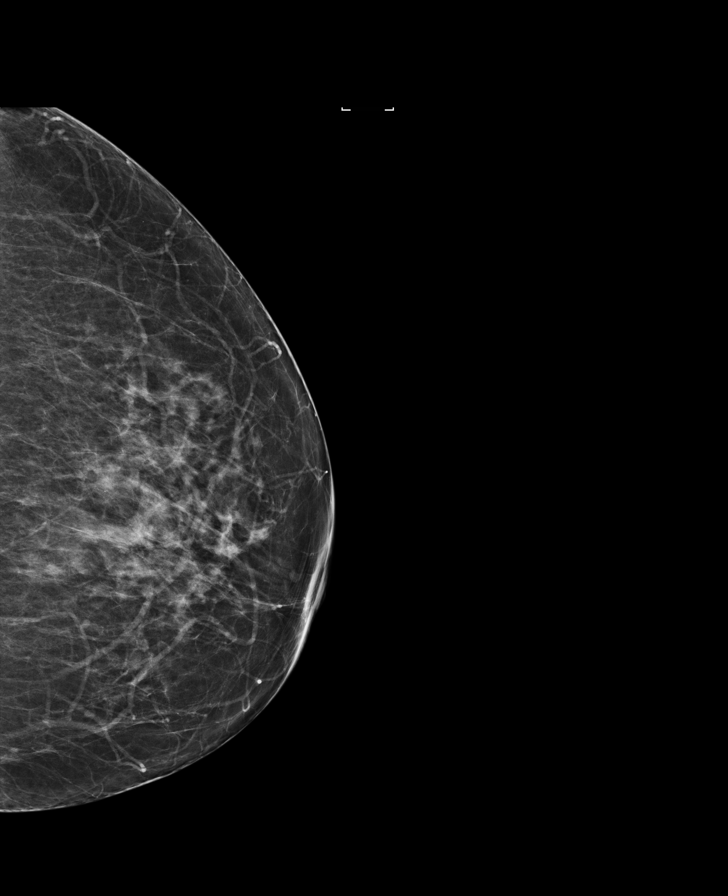

[R CC]
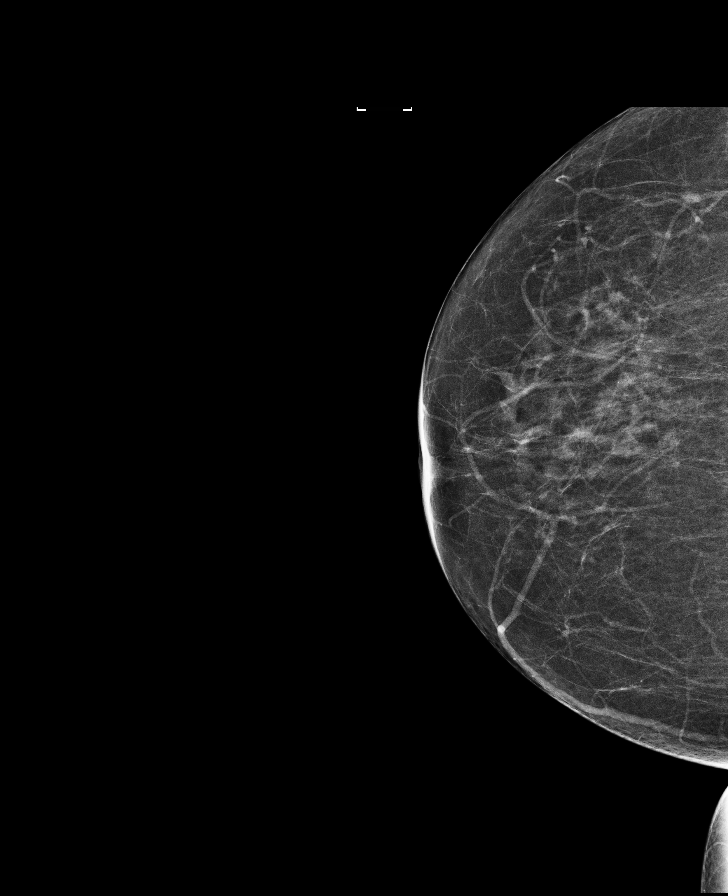

[L MLO (2 of 2)]
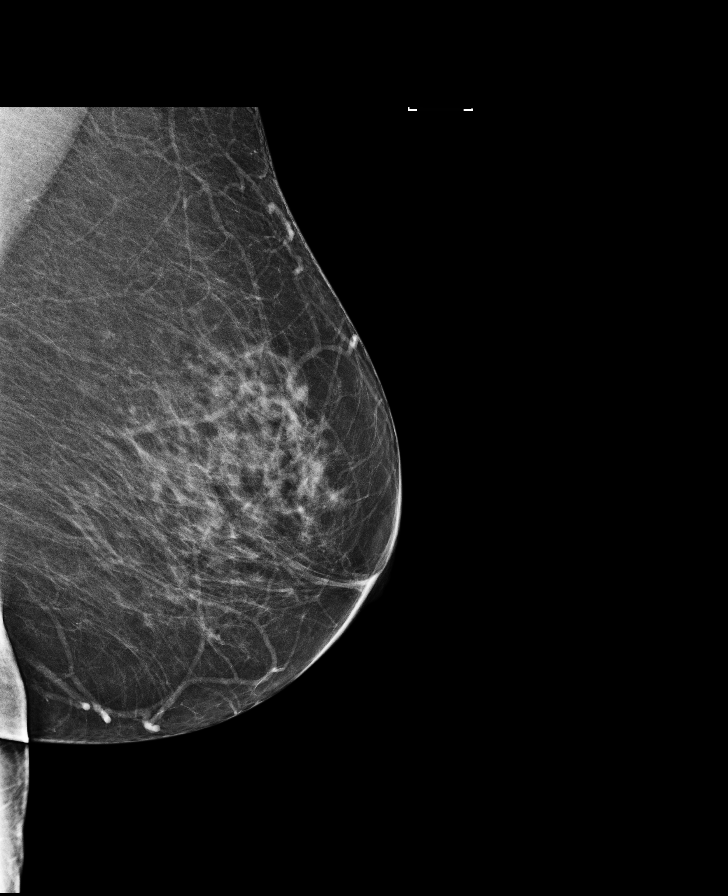

[L CC (2 of 2)]
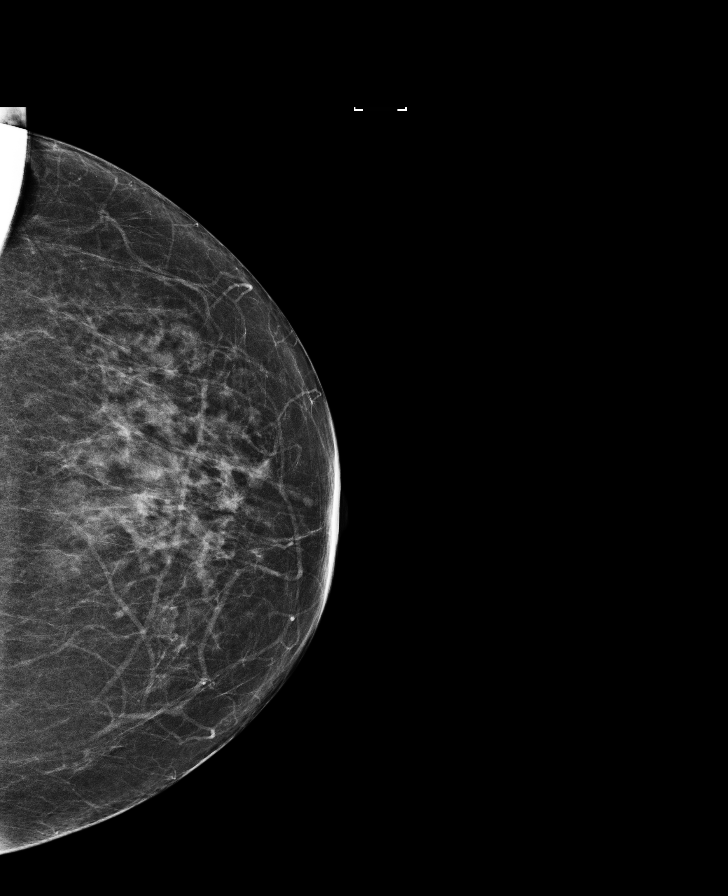

[R MLO (2 of 2)]
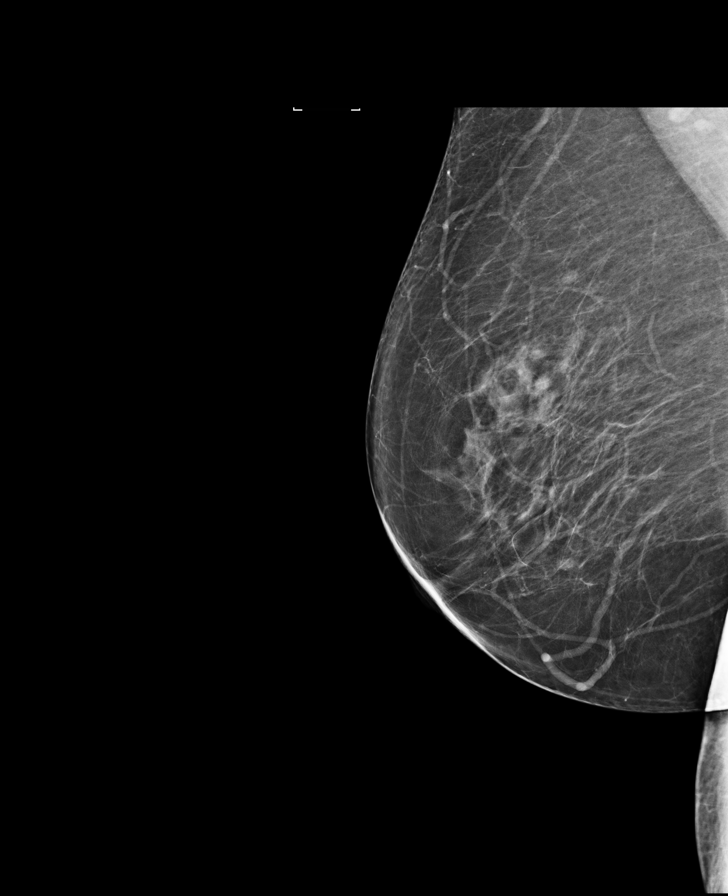

[L CC tomo · tomo slice 31/62.0]
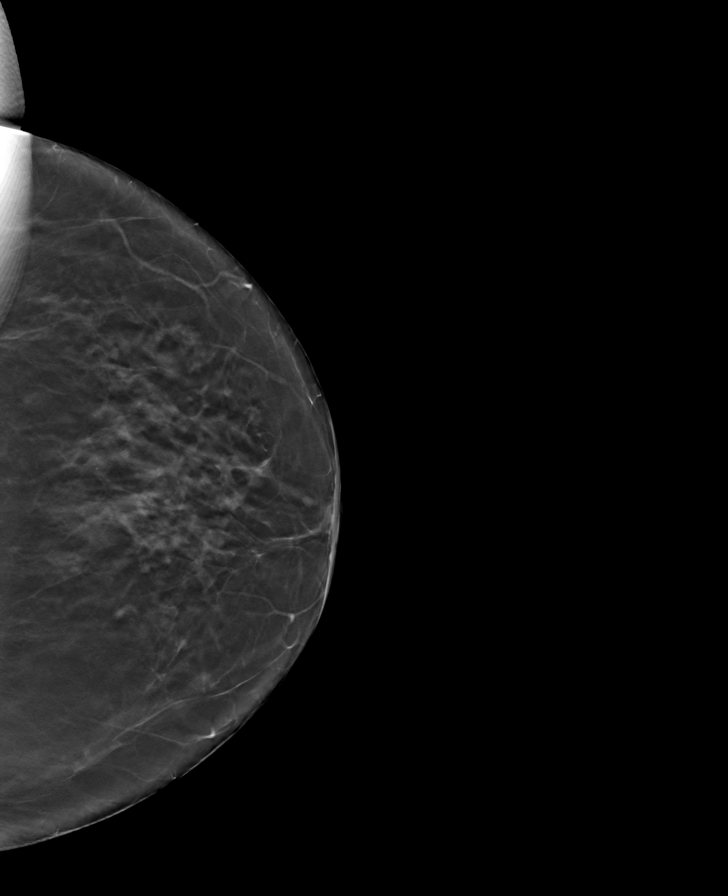

[8 of 27 positions shown; findings below may reference images not displayed]

ACR Breast Density Category c: The breast tissue is heterogeneously
dense, which may obscure small masses.
FINDINGS: There are no findings suspicious for malignancy. Images were
processed with CAD.
IMPRESSION: No mammographic evidence of malignancy. A result letter of this
screening mammogram will be mailed directly to the patient.

RECOMMENDATION:
Screening mammogram in one year. (Code:TN-[id number])

BI-RADS CATEGORY  1: Negative.

## 2017-07-02 ENCOUNTER — Other Ambulatory Visit: Payer: Self-pay | Admitting: Cardiovascular Disease

## 2017-07-03 ENCOUNTER — Ambulatory Visit (INDEPENDENT_AMBULATORY_CARE_PROVIDER_SITE_OTHER): Payer: PPO | Admitting: "Endocrinology

## 2017-07-03 ENCOUNTER — Encounter: Payer: Self-pay | Admitting: "Endocrinology

## 2017-07-03 VITALS — BP 138/79 | HR 61 | Ht 66.0 in | Wt 193.0 lb

## 2017-07-03 DIAGNOSIS — E890 Postprocedural hypothyroidism: Secondary | ICD-10-CM

## 2017-07-03 MED ORDER — LEVOTHYROXINE SODIUM 100 MCG PO TABS: ORAL_TABLET | ORAL | 1 refills | Status: DC

## 2017-07-03 NOTE — Progress Notes (Signed)
Subjective:    Patient ID: Maria Fry, female    DOB: 1951-01-31,    Past Medical History:  Diagnosis Date  . Acute on chronic combined systolic (congestive) and diastolic (congestive) heart failure ([place]) 04/23/2016.   EF 45% and grade 2 diastolic dysfunction.  Marland Kitchen CAD (coronary artery disease)    a. cath 06/07/15 Dist LAD 100%, mid LAD 10%.  . Essential hypertension   . Hip pain   . Hyperlipidemia   . Hypothyroidism   . MI (myocardial infarction) ([place]) 06/05/2015  . Vitamin D deficiency    Past Surgical History:  Procedure Laterality Date  . ABDOMINAL HYSTERECTOMY    . CARDIAC CATHETERIZATION N/A 06/06/2015   Procedure: Left Heart Cath and Coronary Angiography;  Surgeon: Peter M Martinique, MD;  Location: [facility];  Service: Cardiovascular;  Laterality: N/A;  . CHOLECYSTECTOMY    . ESOPHAGOGASTRODUODENOSCOPY N/A 09/11/2016   Procedure: ESOPHAGOGASTRODUODENOSCOPY (EGD);  Surgeon: Daneil Dolin, MD;  Location: [facility];  Service: Endoscopy;  Laterality: N/A;  7:45 am - moved to 10/11 @ 10:30  . Heel spurs    . MALONEY DILATION N/A 09/11/2016   Procedure: Venia Minks DILATION;  Surgeon: Daneil Dolin, MD;  Location: [facility];  Service: Endoscopy;  Laterality: N/A;  . SHOULDER ARTHROSCOPY    . TUBAL LIGATION     Social History   Social History  . Marital status: Single    Spouse name: N/A  . Number of children: N/A  . Years of education: N/A   Social History Main Topics  . Smoking status: Former Smoker    Types: Cigarettes    Start date: 12/02/1977    Quit date: 12/02/2002  . Smokeless tobacco: Never Used  . Alcohol use No  . Drug use: No  . Sexual activity: Yes   Other Topics Concern  . None   Social History Narrative  . None   Outpatient Encounter Prescriptions as of 07/03/2017  Medication Sig  . acetaminophen (TYLENOL) 500 MG tablet Take 500 mg by mouth every 6 (six) hours as needed for mild pain or moderate pain. Reported on 04/22/2016  . aspirin 81  MG chewable tablet Chew 1 tablet (81 mg total) by mouth daily.  Marland Kitchen atorvastatin (LIPITOR) 80 MG tablet Take 1 tablet (80 mg total) by mouth daily at 6 PM.  . clopidogrel (PLAVIX) 75 MG tablet TAKE 1 TABLET BY MOUTH ONCE DAILY  . gabapentin (NEURONTIN) 300 MG capsule Take 300 mg by mouth at bedtime.   Marland Kitchen levothyroxine (SYNTHROID, LEVOTHROID) 100 MCG tablet take 1 tablet by mouth once daily BEFORE BREAKFAST  . loratadine (CLARITIN) 10 MG tablet Take 10 mg by mouth daily as needed for allergies.  . metoprolol tartrate (LOPRESSOR) 25 MG tablet TAKE ONE TABLET BY MOUTH TWICE DAILY **NEW DOSE**  . nitroGLYCERIN (NITROSTAT) 0.4 MG SL tablet Place 1 tablet (0.4 mg total) under the tongue every 5 (five) minutes as needed for chest pain.  . potassium chloride SA (KLOR-CON M15) 15 MEQ tablet Take 1 tablet (15 mEq total) by mouth 2 (two) times daily.  . ranolazine (RANEXA) 500 MG 12 hr tablet Take 1 tablet (500 mg total) by mouth 2 (two) times daily. (6 week supply of sample given)  . [DISCONTINUED] levothyroxine (SYNTHROID, LEVOTHROID) 100 MCG tablet take 1 tablet by mouth once daily BEFORE BREAKFAST   No facility-administered encounter medications on file as of 07/03/2017.    ALLERGIES: Allergies  Allergen Reactions  . Vicodin [  Hydrocodone-Acetaminophen]     Severe nausea and diarrhea   . Demerol [Meperidine] Nausea And Vomiting  . Lisinopril     Throat swollen  . Lodine [Etodolac] Rash   VACCINATION STATUS: Immunization History  Administered Date(s) Administered  . Influenza,inj,Quad PF,36+ Mos 10/02/2015, 08/19/2016    Thyroid Problem  Presents for follow-up (She was given RAI therapy for Graves' disease in the middle of September 2016) visit. Patient reports no cold intolerance, diarrhea, fatigue, heat intolerance or palpitations. The symptoms have been improving. Past treatments include iodine 131. The treatment provided significant relief. Risk factors include prior iodine 131 therapy.      Review of Systems  Constitutional: Negative for fatigue and unexpected weight change.  HENT: Negative for trouble swallowing and voice change.   Eyes: Negative for visual disturbance.  Respiratory: Negative for cough, shortness of breath and wheezing.   Cardiovascular: Negative for chest pain, palpitations and leg swelling.  Gastrointestinal: Negative for diarrhea, nausea and vomiting.  Endocrine: Negative for cold intolerance, heat intolerance, polydipsia, polyphagia and polyuria.  Musculoskeletal: Negative for arthralgias and myalgias.  Skin: Negative for color change, pallor, rash and wound.  Neurological: Negative for seizures and headaches.  Psychiatric/Behavioral: Negative for confusion and suicidal ideas.    Objective:    BP 138/79   Pulse 61   Ht 5\' 6"  (1.676 m)   Wt 193 lb (87.5 kg)   BMI 31.15 kg/m   Wt Readings from Last 3 Encounters:  07/03/17 193 lb (87.5 kg)  06/23/17 196 lb (88.9 kg)  02/05/17 185 lb (83.9 kg)    Physical Exam  Constitutional: She is oriented to person, place, and time. She appears well-developed.  HENT:  Head: Normocephalic and atraumatic.  Eyes: EOM are normal.  Neck: Normal range of motion. Neck supple. No tracheal deviation present. No thyromegaly present.  Cardiovascular: Regular rhythm.   Pulmonary/Chest: Effort normal and breath sounds normal.  Abdominal: Soft. Bowel sounds are normal. There is no tenderness. There is no guarding.  Musculoskeletal: She exhibits no edema.  Neurological: She is alert and oriented to person, place, and time. She has normal reflexes. No cranial nerve deficit. Coordination normal.  Skin: Skin is warm and dry. No rash noted. No erythema. No pallor.  Psychiatric: She has a normal mood and affect. Judgment normal.    Results for orders placed or performed in visit on 06/26/17  TSH  Result Value Ref Range   TSH 2.53 mIU/L  T4, free  Result Value Ref Range   Free T4 1.4 0.8 - 1.8 ng/dL      Assessment & Plan:   1.  RAI induced Hypothyroidism:   She is status post RAI therapy in September 2016 For hyperthyroidism. Her thyroid function tests are consistent with appropriate replacement. I will continue levothyroxine 100 g by mouth every morning.   - We discussed about correct intake of levothyroxine, at fasting, with water, separated by at least 30 minutes from breakfast, and separated by more than 4 hours from calcium, iron, multivitamins, acid reflux medications (PPIs). -Patient is made aware of the fact that thyroid hormone replacement is needed for life, dose to be adjusted by periodic monitoring of thyroid function tests.  I advised patient to maintain close follow up with their PCP for primary care needs. Follow up plan: Return in about 6 months (around 01/03/2018) for follow up with pre-visit labs.  Glade Lloyd, MD Phone: [phone]  Fax: [phone]   07/03/2017, 10:31 AM

## 2017-07-07 ENCOUNTER — Telehealth: Payer: Self-pay

## 2017-07-07 NOTE — Telephone Encounter (Signed)
Pt received letter to set up colonoscopy. She is on blood thinners and she said that she had a heart attack last year, but no GI problems. I made patient OV to come in prior to scheduling colonoscopy.

## 2017-07-09 NOTE — Telephone Encounter (Signed)
LMOM for a return call. I will get more info. Looks like pt is only on plavix blood thinner.  She has OV with Neil Crouch, PA on 08/26/2017 at 10:00 Am.  We can cancel that if she does not need to come in after being triaged.

## 2017-07-10 ENCOUNTER — Other Ambulatory Visit: Payer: Self-pay

## 2017-07-10 ENCOUNTER — Telehealth: Payer: Self-pay

## 2017-07-10 NOTE — Telephone Encounter (Signed)
Doris, the triage template says "incomplete"

## 2017-07-10 NOTE — Patient Outreach (Signed)
[facility]) Care Management  [place]  07/10/2017   Maria Fry 1951/07/11 [id number]  Subjective: Telephone call to patient for monthly call. Patient reports she is doing ok but has gained weight according to her physician.  Patient did receive new scale and her weight is 192 lbs, which is 2 lbs less than when she went to physician.  Discussed with patient diet and exercise.  Patient encouraged to add something different to her workout routine to stimulate more weight loss.  She verbalized understanding. Discussed with heart failure and the different in fluid weight gain and fat weight gain.  She verbalized understanding.    Objective:   Encounter Medications:  Outpatient Encounter Prescriptions as of 07/10/2017  Medication Sig  . acetaminophen (TYLENOL) 500 MG tablet Take 500 mg by mouth every 6 (six) hours as needed for mild pain or moderate pain. Reported on 04/22/2016  . aspirin 81 MG chewable tablet Chew 1 tablet (81 mg total) by mouth daily.  Marland Kitchen atorvastatin (LIPITOR) 80 MG tablet Take 1 tablet (80 mg total) by mouth daily at 6 PM.  . clopidogrel (PLAVIX) 75 MG tablet TAKE 1 TABLET BY MOUTH ONCE DAILY  . gabapentin (NEURONTIN) 300 MG capsule Take 300 mg by mouth at bedtime.   Marland Kitchen levothyroxine (SYNTHROID, LEVOTHROID) 100 MCG tablet take 1 tablet by mouth once daily BEFORE BREAKFAST  . loratadine (CLARITIN) 10 MG tablet Take 10 mg by mouth daily as needed for allergies.  . metoprolol tartrate (LOPRESSOR) 25 MG tablet TAKE ONE TABLET BY MOUTH TWICE DAILY **NEW DOSE**  . nitroGLYCERIN (NITROSTAT) 0.4 MG SL tablet Place 1 tablet (0.4 mg total) under the tongue every 5 (five) minutes as needed for chest pain.  . ranolazine (RANEXA) 500 MG 12 hr tablet Take 1 tablet (500 mg total) by mouth 2 (two) times daily. (6 week supply of sample given)  . potassium chloride SA (KLOR-CON M15) 15 MEQ tablet Take 1 tablet (15 mEq total) by mouth 2 (two) times daily.   No  facility-administered encounter medications on file as of 07/10/2017.     Functional Status:  In your present state of health, do you have any difficulty performing the following activities: 08/20/2016  Hearing? N  Vision? N  Difficulty concentrating or making decisions? N  Walking or climbing stairs? N  Dressing or bathing? N  Doing errands, shopping? N  Preparing Food and eating ? N  Using the Toilet? N  In the past six months, have you accidently leaked urine? N  Do you have problems with loss of bowel control? N  Managing your Medications? N  Managing your Finances? N  Housekeeping or managing your Housekeeping? N  Some recent data might be hidden    Fall/Depression Screening: Fall Risk  07/10/2017 07/03/2017 06/23/2017  Falls in the past year? No No No  Number falls in past yr: - - -  Injury with Fall? - - -  Risk Factor Category  - - -  Risk for fall due to : - - -  Follow up - - -   PHQ 2/9 Scores 07/10/2017 06/23/2017 06/18/2017 05/30/2017 05/01/2017 03/20/2017 02/20/2017  PHQ - 2 Score 0 0 0 0 0 0 0  PHQ- 9 Score - 0 - - - - -    Assessment: Patient continues to benefit from health coach outreach for disease management and support.    Plan:  [facility] CM Care Plan Problem One     Most Recent Value  Care Plan Problem One  Support Needs for Self Health Management CHF  Role Documenting the Problem One  Health Coach  Care Plan for Problem One  Active  [facility] Long Term Goal   Over the next 31 days, patient will demonstrate and/or verbalize understanding of self health management for long term care of CHF  [facility] Long Term Goal Start Date  07/10/17 [goal continued]  Interventions for Problem One Long Term Goal  RN Health Coach reiterated with patient ways of monitoring heart failure, signs and symptoms, and when to notify physician.   [facility] CM Short Term Goal #1   Patient will report receiving scale and weighing daily within 30 days.   [facility] CM Short Term Goal #1 Start Date  06/23/17  [facility] CM  Short Term Goal #1 Met Date  07/10/17  Interventions for Short Term Goal #1  Patient received scale and is weighing.     RN Health Coach will contact patient in the month of September and patient agrees to next outreach.  Dionne J Leath, RN, MSN [facility] Care Management RN Telephonic Health Coach [phone]   

## 2017-07-10 NOTE — Telephone Encounter (Signed)
Gastroenterology Pre-Procedure Review  Request Date: 07/10/2017 Requesting Physician: Dr. [name]  PATIENT REVIEW QUESTIONS: The patient responded to the following health history questions as indicated:    Pt's last colonoscopy was 07/03/2007 by Dr. Gala Romney  1. Diabetes Melitis: no 2. Joint replacements in the past 12 months: no 3. Major health problems in the past 3 months: no 4. Has an artificial valve or MVP: no 5. Has a defibrillator: no 6. Has been advised in past to take antibiotics in advance of a procedure like teeth cleaning: no 7. Family history of colon cancer: no  8. Alcohol Use: no 9. History of sleep apnea: no  10. History of coronary artery or other vascular stents placed within the last 12 months: no 11. History of any prior anesthesia complications: no    MEDICATIONS & ALLERGIES:    Patient reports the following regarding taking any blood thinners:   Plavix? YES Aspirin? YES Coumadin? no Brilinta? no Xarelto? no Eliquis? no Pradaxa? no Savaysa? no Effient? no  Patient confirms/reports the following medications:  Current Outpatient Prescriptions  Medication Sig Dispense Refill  . acetaminophen (TYLENOL) 500 MG tablet Take 500 mg by mouth every 6 (six) hours as needed for mild pain or moderate pain. Reported on 04/22/2016    . aspirin 81 MG chewable tablet Chew 1 tablet (81 mg total) by mouth daily.    Marland Kitchen atorvastatin (LIPITOR) 80 MG tablet Take 1 tablet (80 mg total) by mouth daily at 6 PM. 30 tablet 12  . clopidogrel (PLAVIX) 75 MG tablet TAKE 1 TABLET BY MOUTH ONCE DAILY 30 tablet 3  . gabapentin (NEURONTIN) 300 MG capsule Take 300 mg by mouth at bedtime.     Marland Kitchen levothyroxine (SYNTHROID, LEVOTHROID) 100 MCG tablet take 1 tablet by mouth once daily BEFORE BREAKFAST 90 tablet 1  . loratadine (CLARITIN) 10 MG tablet Take 10 mg by mouth daily as needed for allergies.    . metoprolol tartrate (LOPRESSOR) 25 MG tablet TAKE ONE TABLET BY MOUTH TWICE DAILY **NEW DOSE** 60  tablet 6  . potassium chloride SA (KLOR-CON M15) 15 MEQ tablet Take 1 tablet (15 mEq total) by mouth 2 (two) times daily. 60 tablet 11  . ranolazine (RANEXA) 500 MG 12 hr tablet Take 1 tablet (500 mg total) by mouth 2 (two) times daily. (6 week supply of sample given) 180 tablet 3  . nitroGLYCERIN (NITROSTAT) 0.4 MG SL tablet Place 1 tablet (0.4 mg total) under the tongue every 5 (five) minutes as needed for chest pain. 25 tablet 3   No current facility-administered medications for this visit.     Patient confirms/reports the following allergies:  Allergies  Allergen Reactions  . Vicodin [Hydrocodone-Acetaminophen]     Severe nausea and diarrhea   . Demerol [Meperidine] Nausea And Vomiting  . Lisinopril     Throat swollen  . Lodine [Etodolac] Rash    No orders of the defined types were placed in this encounter.   AUTHORIZATION INFORMATION Primary Insurance:   ID #:  Group #:  Pre-Cert / Auth required:  Pre-Cert / Auth #:   Secondary Insurance:   ID #:   Group #:  Pre-Cert / Auth required: Pre-Cert / Auth #:   SCHEDULE INFORMATION: Procedure has been scheduled as follows:  Date:              Time:   Location:   This Gastroenterology Pre-Precedure Review Form is being routed to the following provider(s): R. Garfield Cornea, MD

## 2017-07-10 NOTE — Telephone Encounter (Signed)
See separate triage to see if pt has to have OV.

## 2017-07-14 NOTE — Telephone Encounter (Signed)
Okay to schedule. Patient does not need an office visit prior to scheduling. MI was in 2016. Last seen by cardiologist in March 2018 and stable.

## 2017-07-16 ENCOUNTER — Other Ambulatory Visit: Payer: Self-pay

## 2017-07-16 ENCOUNTER — Telehealth: Payer: Self-pay

## 2017-07-16 DIAGNOSIS — Z1211 Encounter for screening for malignant neoplasm of colon: Secondary | ICD-10-CM

## 2017-07-16 MED ORDER — PEG 3350-KCL-NA BICARB-NACL 420 G PO SOLR: 4000.0000 mL | ORAL | 0 refills | Status: DC

## 2017-07-16 NOTE — Telephone Encounter (Signed)
Pt was returning DS call from earlier. Please call her back at [phone]

## 2017-07-16 NOTE — Telephone Encounter (Signed)
LMOM to call. ( Can cancel the OV appt on 08/26/2017 and schedule the colonoscopy with Dr. Gala Romney).

## 2017-07-16 NOTE — Telephone Encounter (Signed)
Rx sent to the pharmacy and instructions mailed to pt.  

## 2017-07-16 NOTE — Telephone Encounter (Signed)
See separate note, pt is aware of appt.

## 2017-07-16 NOTE — Telephone Encounter (Signed)
Pt is aware the office visit is cancelled for 08/26/2017.  Colonoscopy scheduled for 08/13/2017 at 8:30 am with Dr. Gala Romney.

## 2017-07-29 NOTE — Telephone Encounter (Signed)
PA # for TCS is [phone]

## 2017-08-11 ENCOUNTER — Other Ambulatory Visit: Payer: Self-pay

## 2017-08-11 NOTE — Patient Outreach (Signed)
[facility] [facility]) Care Management  [place]  08/11/2017   Maria Fry [birth year] [id number]  Subjective: Telephone call to patient for monthly call. Patient reports she was doing good. She is preparing for a colonoscopy on Wednesday. She reports that her weight is 190 lbs.  Encouraged patient to continue to work on getting her weight down.  Discussed with patient heart failure and when to contact physician. She verbalized understanding.    Objective:   Encounter Medications:  Outpatient Encounter Prescriptions as of 08/11/2017  Medication Sig  . acetaminophen (TYLENOL) 500 MG tablet Take 500 mg by mouth every 6 (six) hours as needed for mild pain or moderate pain. Reported on 04/22/2016  . aspirin 81 MG chewable tablet Chew 1 tablet (81 mg total) by mouth daily.  Marland Kitchen atorvastatin (LIPITOR) 80 MG tablet Take 1 tablet (80 mg total) by mouth daily at 6 PM.  . clopidogrel (PLAVIX) 75 MG tablet TAKE 1 TABLET BY MOUTH ONCE DAILY (Patient taking differently: TAKE 75 MG BY MOUTH ONCE DAILY)  . gabapentin (NEURONTIN) 300 MG capsule Take 300 mg by mouth at bedtime.   Marland Kitchen levothyroxine (SYNTHROID, LEVOTHROID) 100 MCG tablet take 1 tablet by mouth once daily BEFORE BREAKFAST (Patient taking differently: Take 100 mcg by mouth daily before breakfast. )  . loratadine (CLARITIN) 10 MG tablet Take 10 mg by mouth daily as needed for allergies.  . metoprolol tartrate (LOPRESSOR) 25 MG tablet TAKE ONE TABLET BY MOUTH TWICE DAILY **NEW DOSE** (Patient taking differently: TAKE 25 MG BY MOUTH TWICE DAILY)  . nitroGLYCERIN (NITROSTAT) 0.4 MG SL tablet Place 1 tablet (0.4 mg total) under the tongue every 5 (five) minutes as needed for chest pain.  . polyethylene glycol-electrolytes (TRILYTE) 420 g solution Take 4,000 mLs by mouth as directed.  . potassium chloride SA (KLOR-CON M15) 15 MEQ tablet Take 1 tablet (15 mEq total) by mouth 2 (two) times daily.  . ranolazine (RANEXA) 500 MG 12 hr tablet Take  1 tablet (500 mg total) by mouth 2 (two) times daily. (6 week supply of sample given) (Patient taking differently: Take 500 mg by mouth 2 (two) times daily. )   No facility-administered encounter medications on file as of 08/11/2017.     Functional Status:  In your present state of health, do you have any difficulty performing the following activities: 08/20/2016  Hearing? N  Vision? N  Difficulty concentrating or making decisions? N  Walking or climbing stairs? N  Dressing or bathing? N  Doing errands, shopping? N  Preparing Food and eating ? N  Using the Toilet? N  In the past six months, have you accidently leaked urine? N  Do you have problems with loss of bowel control? N  Managing your Medications? N  Managing your Finances? N  Housekeeping or managing your Housekeeping? N  Some recent data might be hidden    Fall/Depression Screening: Fall Risk  08/11/2017 07/10/2017 07/03/2017  Falls in the past year? No No No  Number falls in past yr: - - -  Injury with Fall? - - -  Risk Factor Category  - - -  Risk for fall due to : - - -  Follow up - - -   PHQ 2/9 Scores 08/11/2017 07/10/2017 06/23/2017 06/18/2017 05/30/2017 05/01/2017 03/20/2017  PHQ - 2 Score 0 0 0 0 0 0 0  PHQ- 9 Score - - 0 - - - -    Assessment: Patient continues to benefit from health coach  outreach for disease management and support.    Plan:  [facility] CM Care Plan Problem One     Most Recent Value  Care Plan Problem One  Support Needs for Self Health Management CHF  Role Documenting the Problem One  Health Coach  Care Plan for Problem One  Active  [facility] Long Term Goal   Over the next 31 days, patient will demonstrate and/or verbalize understanding of self health management for long term care of CHF  [facility] Long Term Goal Start Date  08/11/17 [goal continued]  Interventions for Problem One [place] reviewed with patient ways of monitoring heart failure, signs and symptoms, and when to notify physician.        RN Health Coach will contact patient in the month of October and patient agrees to next outreach.  [name], RN, MSN [place] [phone]

## 2017-08-12 ENCOUNTER — Ambulatory Visit: Payer: Self-pay

## 2017-08-13 ENCOUNTER — Ambulatory Visit (HOSPITAL_COMMUNITY)
Admission: RE | Admit: 2017-08-13 | Discharge: 2017-08-13 | Disposition: A | Discharge status: Home / Self Care | Payer: PPO | Source: Ambulatory Visit | Attending: Internal Medicine | Admitting: Internal Medicine

## 2017-08-13 ENCOUNTER — Encounter (HOSPITAL_COMMUNITY)
Admission: RE | Disposition: A | Discharge status: Home / Self Care | Payer: Self-pay | Source: Ambulatory Visit | Attending: Internal Medicine

## 2017-08-13 ENCOUNTER — Encounter (HOSPITAL_COMMUNITY): Payer: Self-pay | Admitting: *Deleted

## 2017-08-13 DIAGNOSIS — E559 Vitamin D deficiency, unspecified: Secondary | ICD-10-CM | POA: Diagnosis not present

## 2017-08-13 DIAGNOSIS — Z7902 Long term (current) use of antithrombotics/antiplatelets: Secondary | ICD-10-CM | POA: Insufficient documentation

## 2017-08-13 DIAGNOSIS — D120 Benign neoplasm of cecum: Secondary | ICD-10-CM | POA: Insufficient documentation

## 2017-08-13 DIAGNOSIS — Z1211 Encounter for screening for malignant neoplasm of colon: Secondary | ICD-10-CM

## 2017-08-13 DIAGNOSIS — E039 Hypothyroidism, unspecified: Secondary | ICD-10-CM | POA: Diagnosis not present

## 2017-08-13 DIAGNOSIS — I110 Hypertensive heart disease with heart failure: Secondary | ICD-10-CM | POA: Insufficient documentation

## 2017-08-13 DIAGNOSIS — K640 First degree hemorrhoids: Secondary | ICD-10-CM | POA: Insufficient documentation

## 2017-08-13 DIAGNOSIS — I252 Old myocardial infarction: Secondary | ICD-10-CM | POA: Insufficient documentation

## 2017-08-13 DIAGNOSIS — Z87891 Personal history of nicotine dependence: Secondary | ICD-10-CM | POA: Insufficient documentation

## 2017-08-13 DIAGNOSIS — E785 Hyperlipidemia, unspecified: Secondary | ICD-10-CM | POA: Insufficient documentation

## 2017-08-13 DIAGNOSIS — D122 Benign neoplasm of ascending colon: Secondary | ICD-10-CM | POA: Diagnosis not present

## 2017-08-13 DIAGNOSIS — Z79899 Other long term (current) drug therapy: Secondary | ICD-10-CM | POA: Insufficient documentation

## 2017-08-13 DIAGNOSIS — I5042 Chronic combined systolic (congestive) and diastolic (congestive) heart failure: Secondary | ICD-10-CM | POA: Insufficient documentation

## 2017-08-13 DIAGNOSIS — Z7982 Long term (current) use of aspirin: Secondary | ICD-10-CM | POA: Insufficient documentation

## 2017-08-13 DIAGNOSIS — I2510 Atherosclerotic heart disease of native coronary artery without angina pectoris: Secondary | ICD-10-CM | POA: Insufficient documentation

## 2017-08-13 HISTORY — PX: COLONOSCOPY: SHX5424

## 2017-08-13 HISTORY — PX: POLYPECTOMY: SHX5525

## 2017-08-13 SURGERY — COLONOSCOPY
Anesthesia: Moderate Sedation

## 2017-08-13 MED ORDER — MIDAZOLAM HCL 5 MG/5ML IJ SOLN
INTRAMUSCULAR | Status: DC | PRN
  Administered 2017-08-13 (×3): 1 mg via INTRAVENOUS
  Administered 2017-08-13: 2 mg via INTRAVENOUS

## 2017-08-13 MED ORDER — SIMETHICONE 40 MG/0.6ML PO SUSP
ORAL | Status: DC | PRN
  Administered 2017-08-13: 09:00:00

## 2017-08-13 MED ORDER — FENTANYL CITRATE (PF) 100 MCG/2ML IJ SOLN
INTRAMUSCULAR | Status: AC
  Filled 2017-08-13: qty 2

## 2017-08-13 MED ORDER — MIDAZOLAM HCL 5 MG/5ML IJ SOLN
INTRAMUSCULAR | Status: AC
  Filled 2017-08-13: qty 10

## 2017-08-13 MED ORDER — ONDANSETRON HCL 4 MG/2ML IJ SOLN
INTRAMUSCULAR | Status: DC | PRN
  Administered 2017-08-13: 4 mg via INTRAVENOUS

## 2017-08-13 MED ORDER — FENTANYL CITRATE (PF) 100 MCG/2ML IJ SOLN
INTRAMUSCULAR | Status: DC | PRN
  Administered 2017-08-13 (×2): 25 ug via INTRAVENOUS
  Administered 2017-08-13: 50 ug via INTRAVENOUS

## 2017-08-13 MED ORDER — SODIUM CHLORIDE 0.9 % IV SOLN
INTRAVENOUS | Status: DC
  Administered 2017-08-13: 1000 mL via INTRAVENOUS

## 2017-08-13 MED ORDER — MEPERIDINE HCL 100 MG/ML IJ SOLN
INTRAMUSCULAR | Status: AC
  Filled 2017-08-13: qty 2

## 2017-08-13 MED ORDER — ONDANSETRON HCL 4 MG/2ML IJ SOLN
INTRAMUSCULAR | Status: AC
  Filled 2017-08-13: qty 2

## 2017-08-13 NOTE — H&P (Signed)
@LOGO @   Primary Care Physician:  Alycia Rossetti, MD Primary Gastroenterologist:  Dr. Gala Romney  Pre-Procedure History & Physical: HPI:  Maria Fry is a 66 y.o. female is here for a screening colonoscopy. No bowel symptoms. No family history of colon cancer. Negative colonoscopy 10 years ago. Status post MI on aspirin and Plavix. Allergic to Demerol.  Past Medical History:  Diagnosis Date  . Acute on chronic combined systolic (congestive) and diastolic (congestive) heart failure ([place]) 04/23/2016.   EF 45% and grade 2 diastolic dysfunction.  Marland Kitchen CAD (coronary artery disease)    a. cath 06/07/15 Dist LAD 100%, mid LAD 10%.  . Essential hypertension   . Hip pain   . Hyperlipidemia   . Hypothyroidism   . MI (myocardial infarction) ([place]) 06/05/2015  . Vitamin D deficiency     Past Surgical History:  Procedure Laterality Date  . ABDOMINAL HYSTERECTOMY    . CARDIAC CATHETERIZATION N/A 06/06/2015   Procedure: Left Heart Cath and Coronary Angiography;  Surgeon: Peter M Martinique, MD;  Location: [facility];  Service: Cardiovascular;  Laterality: N/A;  . CHOLECYSTECTOMY    . COLONOSCOPY    . ESOPHAGOGASTRODUODENOSCOPY N/A 09/11/2016   Procedure: ESOPHAGOGASTRODUODENOSCOPY (EGD);  Surgeon: Daneil Dolin, MD;  Location: [facility];  Service: Endoscopy;  Laterality: N/A;  7:45 am - moved to 10/11 @ 10:30  . Heel spurs    . MALONEY DILATION N/A 09/11/2016   Procedure: Venia Minks DILATION;  Surgeon: Daneil Dolin, MD;  Location: [facility];  Service: Endoscopy;  Laterality: N/A;  . SHOULDER ARTHROSCOPY    . TUBAL LIGATION      Prior to Admission medications   Medication Sig Start Date End Date Taking? Authorizing Provider  aspirin 81 MG chewable tablet Chew 1 tablet (81 mg total) by mouth daily. 06/08/15  Yes Bhagat, Bhavinkumar, PA  atorvastatin (LIPITOR) 80 MG tablet Take 1 tablet (80 mg total) by mouth daily at 6 PM. 08/09/16  Yes [name], MD  clopidogrel (PLAVIX) 75 MG  tablet TAKE 1 TABLET BY MOUTH ONCE DAILY Patient taking differently: TAKE 75 MG BY MOUTH ONCE DAILY 07/02/17  Yes [name], MD  gabapentin (NEURONTIN) 300 MG capsule Take 300 mg by mouth at bedtime.    Yes [provider]  levothyroxine (SYNTHROID, LEVOTHROID) 100 MCG tablet take 1 tablet by mouth once daily BEFORE BREAKFAST Patient taking differently: Take 100 mcg by mouth daily before breakfast.  07/03/17  Yes Nida, Marella Chimes, MD  loratadine (CLARITIN) 10 MG tablet Take 10 mg by mouth daily as needed for allergies.   Yes [provider]  metoprolol tartrate (LOPRESSOR) 25 MG tablet TAKE ONE TABLET BY MOUTH TWICE DAILY **NEW DOSE** Patient taking differently: TAKE 25 MG BY MOUTH TWICE DAILY 04/02/17  Yes Lane, [name], MD  potassium chloride SA (KLOR-CON M15) 15 MEQ tablet Take 1 tablet (15 mEq total) by mouth 2 (two) times daily. 08/09/16  Yes [name], MD  ranolazine (RANEXA) 500 MG 12 hr tablet Take 1 tablet (500 mg total) by mouth 2 (two) times daily. (6 week supply of sample given) Patient taking differently: Take 500 mg by mouth 2 (two) times daily.  02/05/17  Yes [name], MD  acetaminophen (TYLENOL) 500 MG tablet Take 500 mg by mouth every 6 (six) hours as needed for mild pain or moderate pain. Reported on 04/22/2016    [provider]  nitroGLYCERIN (NITROSTAT) 0.4 MG SL tablet  Place 1 tablet (0.4 mg total) under the tongue every 5 (five) minutes as needed for chest pain. 02/05/17   [name], MD  polyethylene glycol-electrolytes (TRILYTE) 420 g solution Take 4,000 mLs by mouth as directed. 07/16/17   Daneil Dolin, MD    Allergies as of 07/16/2017 - Review Complete 07/10/2017  Allergen Reaction Noted  . Vicodin [hydrocodone-acetaminophen]  04/25/2016  . Demerol [meperidine] Nausea And Vomiting 01/17/2013  . Lisinopril  06/13/2015  . Lodine [etodolac] Rash 01/17/2013    Family History  Problem Relation Age of  Onset  . Heart failure Father        Deceased  . Heart attack Father        Deceased  . Stroke Sister        Deceased  . Heart failure Brother        Deceased  . Cancer Brother        Deceased  . Heart attack Brother        Deceased  . Colon cancer Neg Hx     Social History   Social History  . Marital status: Single    Spouse name: N/A  . Number of children: N/A  . Years of education: N/A   Occupational History  . Not on file.   Social History Main Topics  . Smoking status: Former Smoker    Types: Cigarettes    Start date: 12/02/1977    Quit date: 12/02/2002  . Smokeless tobacco: Never Used  . Alcohol use No  . Drug use: No  . Sexual activity: Yes   Other Topics Concern  . Not on file   Social History Narrative  . No narrative on file    Review of Systems: See HPI, otherwise negative ROS  Physical Exam: BP (!) 141/74   Pulse (!) 56   Temp (!) 97.4 F (36.3 C) (Oral)   Resp 12   Ht 5' (1.524 m)   Wt 190 lb (86.2 kg)   SpO2 100%   BMI 37.11 kg/m  General:   Alert,  Well-developed, well-nourished, pleasant and cooperative in NAD Lungs:  Clear throughout to auscultation.   No wheezes, crackles, or rhonchi. No acute distress. Heart:  Regular rate and rhythm; no murmurs, clicks, rubs,  or gallops. Abdomen:  Soft, nontender and nondistended. No masses, hepatosplenomegaly or hernias noted. Normal bowel sounds, without guarding, and without rebound.    Impression/Plan: Maria Fry is now here to undergo a screening colonoscopy.  Average for screening examination Risks, benefits, limitations, imponderables and alternatives regarding colonoscopy have been reviewed with the patient. Questions have been answered. All parties agreeable.                  Notice:  This dictation was prepared with Dragon dictation along with smaller phrase technology. Any transcriptional errors that result from this process are unintentional and may not be corrected upon  review.

## 2017-08-13 NOTE — Op Note (Signed)
[facility] Patient Name: Maria Fry Procedure Date: 08/13/2017 8:14 AM MRN: [id number] Date of Birth: 02/06/1951 Attending MD: Norvel Richards , MD CSN: [id number] Age: 66 Admit Type: Outpatient Procedure:                Colonoscopy Indications:              Screening for colorectal malignant neoplasm Providers:                Norvel Richards, MD, Jeanann Lewandowsky. Sharon Seller, RN,                            Rosina Lowenstein, RN Referring MD:              Medicines:                Midazolam 5 mg IV, Fentanyl 100 micrograms IV,                            Ondansetron 4 mg IV Complications:            No immediate complications. Estimated Blood Loss:     Estimated blood loss was minimal. Procedure:                Pre-Anesthesia Assessment:                           - Prior to the procedure, a History and Physical                            was performed, and patient medications and                            allergies were reviewed. The patient's tolerance of                            previous anesthesia was also reviewed. The risks                            and benefits of the procedure and the sedation                            options and risks were discussed with the patient.                            All questions were answered, and informed consent                            was obtained. Prior Anticoagulants: The patient                            last took Plavix (clopidogrel) 1 day prior to the                            procedure. ASA Grade Assessment: III - A patient  with severe systemic disease. After reviewing the                            risks and benefits, the patient was deemed in                            satisfactory condition to undergo the procedure.                           After obtaining informed consent, the colonoscope                            was passed under direct vision. Throughout the   procedure, the patient's blood pressure, pulse, and                            oxygen saturations were monitored continuously. The                            Colonoscope was introduced through the anus and                            advanced to the the cecum, identified by                            appendiceal orifice and ileocecal valve. The                            colonoscopy was performed without difficulty. The                            patient tolerated the procedure well. The quality                            of the bowel preparation was adequate. The                            ileocecal valve, appendiceal orifice, and rectum                            were photographed. The entire colon was well                            visualized. Scope In: 8:43:13 AM Scope Out: 9:06:05 AM Scope Withdrawal Time: 0 hours 9 minutes 45 seconds  Total Procedure Duration: 0 hours 22 minutes 52 seconds  Findings:      The perianal and digital rectal examinations were normal.      Three semi-pedunculated polyps were found in the ascending colon and       cecum. The polyps were 4 to 6 mm in size. These polyps were removed with       a cold snare. Resection and retrieval were complete. Estimated blood       loss was minimal.      Internal hemorrhoids were found during retroflexion. The hemorrhoids  were Grade I (internal hemorrhoids that do not prolapse).      The exam was otherwise without abnormality on direct and retroflexion       views. Impression:               - Three 4 to 6 mm polyps in the ascending colon and                            in the cecum, removed with a cold snare. Resected                            and retrieved.                           - Internal hemorrhoids.                           - The examination was otherwise normal on direct                            and retroflexion views. Moderate Sedation:      Moderate (conscious) sedation was administered by the  endoscopy nurse       and supervised by the endoscopist. The following parameters were       monitored: oxygen saturation, heart rate, blood pressure, respiratory       rate, EKG, adequacy of pulmonary ventilation, and response to care.       Total physician intraservice time was 29 minutes. Recommendation:           - Patient has a contact number available for                            emergencies. The signs and symptoms of potential                            delayed complications were discussed with the                            patient. Return to normal activities tomorrow.                            Written discharge instructions were provided to the                            patient.                           - Resume previous diet.                           - Continue present medications.                           - Repeat colonoscopy date to be determined after                            pending pathology results are reviewed for  surveillance based on pathology results.                           - Return to GI clinic (date not yet determined). Procedure Code(s):        --- Professional ---                           [id number], Colonoscopy, flexible; with removal of                            tumor(s), polyp(s), or other lesion(s) by snare                            technique                           99152, Moderate sedation services provided by the                            same physician or other qualified health care                            professional performing the diagnostic or                            therapeutic service that the sedation supports,                            requiring the presence of an independent trained                            observer to assist in the monitoring of the                            patient's level of consciousness and physiological                            status; initial 15 minutes of intraservice  time,                            patient age 41 years or older                           [phone], Moderate sedation services; each additional                            15 minutes intraservice time Diagnosis Code(s):        --- Professional ---                           Z12.11, Encounter for screening for malignant                            neoplasm of colon  D12.2, Benign neoplasm of ascending colon                           D12.0, Benign neoplasm of cecum                           K64.0, First degree hemorrhoids CPT copyright 2016 [employer]. All rights reserved. The codes documented in this report are preliminary and upon coder review may  be revised to meet current compliance requirements. Cristopher Estimable. Habib Kise, MD Norvel Richards, MD 08/13/2017 9:20:54 AM This report has been signed electronically. Number of Addenda: 0

## 2017-08-13 NOTE — Discharge Instructions (Addendum)
Colon Polyps °Polyps are tissue growths inside the body. Polyps can grow in many places, including the large intestine (colon). A polyp may be a round bump or a mushroom-shaped growth. You could have one polyp or several. °Most colon polyps are noncancerous (benign). However, some colon polyps can become cancerous over time. °What are the causes? °The exact cause of colon polyps is not known. °What increases the risk? °This condition is more likely to develop in people who: °· Have a family history of colon cancer or colon polyps. °· Are older than 50 or older than 45 if they are African American. °· Have inflammatory bowel disease, such as ulcerative colitis or Crohn disease. °· Are overweight. °· Smoke cigarettes. °· Do not get enough exercise. °· Drink too much alcohol. °· Eat a diet that is: °? High in fat and red meat. °? Low in fiber. °· Had childhood cancer that was treated with abdominal radiation. ° °What are the signs or symptoms? °Most polyps do not cause symptoms. If you have symptoms, they may include: °· Blood coming from your rectum when having a bowel movement. °· Blood in your stool. The stool may look dark red or black. °· A change in bowel habits, such as constipation or diarrhea. ° °How is this diagnosed? °This condition is diagnosed with a colonoscopy. This is a procedure that uses a lighted, flexible scope to look at the inside of your colon. °How is this treated? °Treatment for this condition involves removing any polyps that are found. Those polyps will then be tested for cancer. If cancer is found, your health care provider will talk to you about options for colon cancer treatment. °Follow these instructions at home: °Diet °· Eat plenty of fiber, such as fruits, vegetables, and whole grains. °· Eat foods that are high in calcium and vitamin D, such as milk, cheese, yogurt, eggs, liver, fish, and broccoli. °· Limit foods high in fat, red meats, and processed meats, such as hot dogs, sausage,  bacon, and lunch meats. °· Maintain a healthy weight, or lose weight if recommended by your health care provider. °General instructions °· Do not smoke cigarettes. °· Do not drink alcohol excessively. °· Keep all follow-up visits as told by your health care provider. This is important. This includes keeping regularly scheduled colonoscopies. Talk to your health care provider about when you need a colonoscopy. °· Exercise every day or as told by your health care provider. °Contact a health care provider if: °· You have new or worsening bleeding during a bowel movement. °· You have new or increased blood in your stool. °· You have a change in bowel habits. °· You unexpectedly lose weight. °This information is not intended to replace advice given to you by your health care provider. Make sure you discuss any questions you have with your health care provider. °Document Released: 08/14/2004 Document Revised: 04/25/2016 Document Reviewed: 10/09/2015 °Elsevier Interactive Patient Education © 2018 [employer] ° °Colonoscopy °Discharge Instructions ° °Read the instructions outlined below and refer to this sheet in the next few weeks. These discharge instructions provide you with general information on caring for yourself after you leave the hospital. Your doctor may also give you specific instructions. While your treatment has been planned according to the most current medical practices available, unavoidable complications occasionally occur. If you have any problems or questions after discharge, call Dr. [name] at [phone]. °ACTIVITY °· You may resume your regular activity, but move at a slower pace for the next 24   hours.  °· Take frequent rest periods for the next 24 hours.  °· Walking will help get rid of the air and reduce the bloated feeling in your belly (abdomen).  °· No driving for 24 hours (because of the medicine (anesthesia) used during the test).   °· Do not sign any important legal documents or operate any  machinery for 24 hours (because of the anesthesia used during the test).  °NUTRITION °· Drink plenty of fluids.  °· You may resume your normal diet as instructed by your doctor.  °· Begin with a light meal and progress to your normal diet. Heavy or fried foods are harder to digest and may make you feel sick to your stomach (nauseated).  °· Avoid alcoholic beverages for 24 hours or as instructed.  °MEDICATIONS °· You may resume your normal medications unless your doctor tells you otherwise.  °WHAT YOU CAN EXPECT TODAY °· Some feelings of bloating in the abdomen.  °· Passage of more gas than usual.  °· Spotting of blood in your stool or on the toilet paper.  °IF YOU HAD POLYPS REMOVED DURING THE COLONOSCOPY: °· No aspirin products for 7 days or as instructed.  °· No alcohol for 7 days or as instructed.  °· Eat a soft diet for the next 24 hours.  °FINDING OUT THE RESULTS OF YOUR TEST °Not all test results are available during your visit. If your test results are not back during the visit, make an appointment with your caregiver to find out the results. Do not assume everything is normal if you have not heard from your caregiver or the medical facility. It is important for you to follow up on all of your test results.  °SEEK IMMEDIATE MEDICAL ATTENTION IF: °· You have more than a spotting of blood in your stool.  °· Your belly is swollen (abdominal distention).  °· You are nauseated or vomiting.  °· You have a temperature over 101.  °· You have abdominal pain or discomfort that is severe or gets worse throughout the day.  ° ° ° °Colon polyp information provided ° °Further recommendations to follow pending review of pathology report °

## 2017-08-14 ENCOUNTER — Encounter (HOSPITAL_COMMUNITY): Payer: Self-pay | Admitting: Internal Medicine

## 2017-08-14 ENCOUNTER — Encounter: Payer: Self-pay | Admitting: Internal Medicine

## 2017-08-25 ENCOUNTER — Encounter: Payer: Self-pay | Admitting: Cardiovascular Disease

## 2017-08-25 ENCOUNTER — Ambulatory Visit (INDEPENDENT_AMBULATORY_CARE_PROVIDER_SITE_OTHER): Payer: PPO | Admitting: Cardiovascular Disease

## 2017-08-25 VITALS — BP 140/82 | HR 58 | Ht 60.0 in | Wt 194.0 lb

## 2017-08-25 DIAGNOSIS — I38 Endocarditis, valve unspecified: Secondary | ICD-10-CM | POA: Diagnosis not present

## 2017-08-25 DIAGNOSIS — I25118 Atherosclerotic heart disease of native coronary artery with other forms of angina pectoris: Secondary | ICD-10-CM | POA: Diagnosis not present

## 2017-08-25 DIAGNOSIS — I10 Essential (primary) hypertension: Secondary | ICD-10-CM

## 2017-08-25 DIAGNOSIS — Z23 Encounter for immunization: Secondary | ICD-10-CM

## 2017-08-25 DIAGNOSIS — E785 Hyperlipidemia, unspecified: Secondary | ICD-10-CM | POA: Diagnosis not present

## 2017-08-25 DIAGNOSIS — I252 Old myocardial infarction: Secondary | ICD-10-CM

## 2017-08-25 DIAGNOSIS — I5042 Chronic combined systolic (congestive) and diastolic (congestive) heart failure: Secondary | ICD-10-CM

## 2017-08-25 MED ORDER — RANOLAZINE ER 500 MG PO TB12: 500.0000 mg | ORAL_TABLET | Freq: Two times a day (BID) | ORAL | 3 refills | Status: DC

## 2017-08-25 MED ORDER — METOPROLOL TARTRATE 25 MG PO TABS: ORAL_TABLET | ORAL | 3 refills | Status: DC

## 2017-08-25 MED ORDER — ATORVASTATIN CALCIUM 80 MG PO TABS: 80.0000 mg | ORAL_TABLET | Freq: Every day | ORAL | 3 refills | Status: DC

## 2017-08-25 MED ORDER — CLOPIDOGREL BISULFATE 75 MG PO TABS: 75.0000 mg | ORAL_TABLET | Freq: Every day | ORAL | 3 refills | Status: DC

## 2017-08-25 NOTE — Patient Instructions (Signed)
Your physician wants you to follow-up in: 6 months with Dr.Koneswaran You will receive a reminder letter in the mail two months in advance. If you don't receive a letter, please call our office to schedule the follow-up appointment.   Your physician recommends that you continue on your current medications as directed. Please refer to the Current Medication list given to you today.    If you need a refill on your cardiac medications before your next appointment, please call your pharmacy.     No lab work or tests ordered today.     Thank you for choosing [facility] !         

## 2017-08-25 NOTE — Progress Notes (Signed)
SUBJECTIVE: The patient presents for follow-up of coronary artery disease, hypertension, dyslipidemia, valvular heart disease, and chronic combined systolic and diastolic heart failure.  Echocardiogram 04/23/16 showed mildly reduced left ventricular systolic function, LVEF 40%, with no significant change from echo dated 06/06/15. There was moderate concentric LVH with grade 2 diastolic dysfunction and wall motion abnormalities c/w CAD, mild aortic, mild tricuspid, and moderate mitral regurgitation with severe left atrial dilatation.  06/23/17: Total cholesterol 112, triglycerides 61, HDL 50, LDL 50.  The patient denies any symptoms of chest pain, palpitations, shortness of breath, lightheadedness, dizziness, leg swelling, orthopnea, PND, and syncope.  She made 2 trips to [facility] this past summer.   Review of Systems: As per "subjective", otherwise negative.  Allergies  Allergen Reactions  . Vicodin [Hydrocodone-Acetaminophen] Other (See Comments)    Severe nausea and diarrhea   . Demerol [Meperidine] Nausea And Vomiting  . Lisinopril Other (See Comments)    Throat swollen  . Lodine [Etodolac] Rash    Current Outpatient Prescriptions  Medication Sig Dispense Refill  . acetaminophen (TYLENOL) 500 MG tablet Take 500 mg by mouth every 6 (six) hours as needed for mild pain or moderate pain. Reported on 04/22/2016    . aspirin 81 MG chewable tablet Chew 1 tablet (81 mg total) by mouth daily.    Marland Kitchen atorvastatin (LIPITOR) 80 MG tablet Take 1 tablet (80 mg total) by mouth daily at 6 PM. 30 tablet 12  . clopidogrel (PLAVIX) 75 MG tablet TAKE 1 TABLET BY MOUTH ONCE DAILY (Patient taking differently: TAKE 75 MG BY MOUTH ONCE DAILY) 30 tablet 3  . gabapentin (NEURONTIN) 300 MG capsule Take 300 mg by mouth at bedtime.     Marland Kitchen levothyroxine (SYNTHROID, LEVOTHROID) 100 MCG tablet take 1 tablet by mouth once daily BEFORE BREAKFAST (Patient taking differently: Take 100 mcg by mouth daily before  breakfast. ) 90 tablet 1  . loratadine (CLARITIN) 10 MG tablet Take 10 mg by mouth daily as needed for allergies.    . metoprolol tartrate (LOPRESSOR) 25 MG tablet TAKE ONE TABLET BY MOUTH TWICE DAILY **NEW DOSE** (Patient taking differently: TAKE 25 MG BY MOUTH TWICE DAILY) 60 tablet 6  . nitroGLYCERIN (NITROSTAT) 0.4 MG SL tablet Place 1 tablet (0.4 mg total) under the tongue every 5 (five) minutes as needed for chest pain. 25 tablet 3  . potassium chloride SA (KLOR-CON M15) 15 MEQ tablet Take 1 tablet (15 mEq total) by mouth 2 (two) times daily. 60 tablet 11  . ranolazine (RANEXA) 500 MG 12 hr tablet Take 1 tablet (500 mg total) by mouth 2 (two) times daily. (6 week supply of sample given) (Patient taking differently: Take 500 mg by mouth 2 (two) times daily. ) 180 tablet 3   No current facility-administered medications for this visit.     Past Medical History:  Diagnosis Date  . Acute on chronic combined systolic (congestive) and diastolic (congestive) heart failure ([place]) 04/23/2016.   EF 45% and grade 2 diastolic dysfunction.  Marland Kitchen CAD (coronary artery disease)    a. cath 06/07/15 Dist LAD 100%, mid LAD 10%.  . Essential hypertension   . Hip pain   . Hyperlipidemia   . Hypothyroidism   . MI (myocardial infarction) ([place]) 06/05/2015  . Vitamin D deficiency     Past Surgical History:  Procedure Laterality Date  . ABDOMINAL HYSTERECTOMY    . CARDIAC CATHETERIZATION N/A 06/06/2015   Procedure: Left Heart Cath and Coronary Angiography;  Surgeon:  Peter M Martinique, MD;  Location: [facility];  Service: Cardiovascular;  Laterality: N/A;  . CHOLECYSTECTOMY    . COLONOSCOPY    . COLONOSCOPY N/A 08/13/2017   Procedure: COLONOSCOPY;  Surgeon: Daneil Dolin, MD;  [facility];  Service: Endoscopy;  Laterality: N/A;  8:30 AM  . ESOPHAGOGASTRODUODENOSCOPY N/A 09/11/2016   Procedure: ESOPHAGOGASTRODUODENOSCOPY (EGD);  Surgeon: Daneil Dolin, MD;  Location: [facility];  Service:  Endoscopy;  Laterality: N/A;  7:45 am - moved to 10/11 @ 10:30  . Heel spurs    . MALONEY DILATION N/A 09/11/2016   Procedure: Venia Minks DILATION;  Surgeon: Daneil Dolin, MD;  Location: [facility];  Service: Endoscopy;  Laterality: N/A;  . POLYPECTOMY  08/13/2017   Procedure: POLYPECTOMY;  Surgeon: Daneil Dolin, MD;  Location[facility];  Service: Endoscopy;;  colon  . SHOULDER ARTHROSCOPY    . TUBAL LIGATION      Social History   Social History  . Marital status: Single    Spouse name: N/A  . Number of children: N/A  . Years of education: N/A   Occupational History  . Not on file.   Social History Main Topics  . Smoking status: Former Smoker    Types: Cigarettes    Start date: 12/02/1977    Quit date: 12/02/2002  . Smokeless tobacco: Never Used  . Alcohol use No  . Drug use: No  . Sexual activity: Yes   Other Topics Concern  . Not on file   Social History Narrative  . No narrative on file     Vitals:   08/25/17 1016  BP: 140/82  Pulse: (!) 58  SpO2: 97%  Weight: 194 lb (88 kg)  Height: 5' (1.524 m)    Wt Readings from Last 3 Encounters:  08/25/17 194 lb (88 kg)  08/13/17 190 lb (86.2 kg)  07/03/17 193 lb (87.5 kg)     PHYSICAL EXAM General: NAD HEENT: Normal. Neck: No JVD, no thyromegaly. Lungs: Clear to auscultation bilaterally with normal respiratory effort. CV: Nondisplaced PMI.  Regular rate and rhythm, normal S1/S2, no S3/S4, no murmur. No pretibial or periankle edema.  No carotid bruit.   Abdomen: Soft, nontender, no distention.  Neurologic: Alert and oriented.  Psych: Normal affect. Skin: Normal. Musculoskeletal: No gross deformities.    ECG: Most recent ECG reviewed.   Labs: Lab Results  Component Value Date/Time   K 4.5 06/23/2017 09:02 AM   BUN 13 06/23/2017 09:02 AM   CREATININE 0.98 06/23/2017 09:02 AM   ALT 12 06/23/2017 09:02 AM   TSH 2.53 06/26/2017 08:22 AM   HGB 11.7 (L) 06/23/2017 09:02 AM     Lipids: Lab  Results  Component Value Date/Time   LDLCALC 50 06/23/2017 09:02 AM   CHOL 112 06/23/2017 09:02 AM   TRIG 61 06/23/2017 09:02 AM   HDL 50 (L) 06/23/2017 09:02 AM       ASSESSMENT AND PLAN: 1. CAD with NSTEMI and distal 100% LAD stenosis: Symptomatically stable. Continue aspirin, Lipitor, Ranexa, and metoprolol. Allergic to ACEI.  2. Essential HTN: Mildly elevated. Target BP 130/80. No changes today.   3. Dyslipidemia: Continue high intensity statin therapy with Lipitor 80 mg daily. Lipids from 06/2017 reviewed above.  4. Chronic systolic and diastolic heart failure: Euvolemic on Lasix 40 mg daily. No changes.  5. Valvular heart disease: Will monitor with surveillance echocardiography.      Disposition: Follow up 6 months.  Kate Sable, M.D., F.A.C.C.

## 2017-08-26 ENCOUNTER — Ambulatory Visit: Payer: PPO | Admitting: Gastroenterology

## 2017-08-29 ENCOUNTER — Other Ambulatory Visit: Payer: Self-pay | Admitting: Cardiovascular Disease

## 2017-09-08 ENCOUNTER — Ambulatory Visit: Payer: Self-pay

## 2017-09-17 ENCOUNTER — Other Ambulatory Visit: Payer: Self-pay

## 2017-09-17 NOTE — Patient Outreach (Signed)
[facility]) Care Management  09/17/2017  Maria Fry 03/30/1951 [id number]   Telephone call to patient for monthly call.  No answer.  HIPAA compliant voice message left.    Plan: RN CM will attempt patient again in the month of October.   [name], RN, MSN [facility] Care Management Care Management Coordinator Direct Line [phone] Toll Free: [phone]  Fax: [phone]

## 2017-09-19 ENCOUNTER — Other Ambulatory Visit: Payer: Self-pay

## 2017-09-19 NOTE — Patient Outreach (Signed)
[facility]) Care Management  09/19/2017  Maria Fry March 14, 1951 [id number]   2nd Telephone call to patient for monthly call.  No answer. HIPAA compliant voice message left.    Plan: RN CM will attempt patient again in the month of October.  [name], RN, MSN [facility] Care Management Care Management Coordinator Direct Line [phone] Toll Free: [phone]  Fax: [phone]

## 2017-09-24 ENCOUNTER — Other Ambulatory Visit: Payer: Self-pay

## 2017-09-24 NOTE — Patient Outreach (Signed)
[facility]) Care Management  [place]  09/24/2017   Maria Fry 03-20-1951 [id number]  Subjective: Telephone call to patient for monthly call. Patient reports that she is doing alright dealing with family stress. Patient reports that her weight is 191 lbs.  Patient continues to care for self and go to the [facility].  Discussed continued regimen.  She verbalized understanding.    Objective:   Encounter Medications:  Outpatient Encounter Prescriptions as of 09/24/2017  Medication Sig  . acetaminophen (TYLENOL) 500 MG tablet Take 500 mg by mouth every 6 (six) hours as needed for mild pain or moderate pain. Reported on 04/22/2016  . aspirin 81 MG chewable tablet Chew 1 tablet (81 mg total) by mouth daily.  Marland Kitchen atorvastatin (LIPITOR) 80 MG tablet Take 1 tablet (80 mg total) by mouth daily at 6 PM.  . clopidogrel (PLAVIX) 75 MG tablet Take 1 tablet (75 mg total) by mouth daily.  Marland Kitchen gabapentin (NEURONTIN) 300 MG capsule Take 300 mg by mouth at bedtime.   Marland Kitchen KLOR-CON M15 15 MEQ tablet TAKE ONE TABLET BY MOUTH TWICE DAILY  . levothyroxine (SYNTHROID, LEVOTHROID) 100 MCG tablet take 1 tablet by mouth once daily BEFORE BREAKFAST (Patient taking differently: Take 100 mcg by mouth daily before breakfast. )  . loratadine (CLARITIN) 10 MG tablet Take 10 mg by mouth daily as needed for allergies.  . metoprolol tartrate (LOPRESSOR) 25 MG tablet TAKE 25 MG BY MOUTH TWICE DAILY  . nitroGLYCERIN (NITROSTAT) 0.4 MG SL tablet Place 1 tablet (0.4 mg total) under the tongue every 5 (five) minutes as needed for chest pain.  . ranolazine (RANEXA) 500 MG 12 hr tablet Take 1 tablet (500 mg total) by mouth 2 (two) times daily.   No facility-administered encounter medications on file as of 09/24/2017.     Functional Status:  No flowsheet data found.  Fall/Depression Screening: Fall Risk  09/24/2017 08/11/2017 07/10/2017  Falls in the past year? No No No  Number falls in past yr: - - -  Injury  with Fall? - - -  Risk Factor Category  - - -  Risk for fall due to : - - -  Follow up - - -   PHQ 2/9 Scores 09/24/2017 08/11/2017 07/10/2017 06/23/2017 06/18/2017 05/30/2017 05/01/2017  PHQ - 2 Score 0 0 0 0 0 0 0  PHQ- 9 Score - - - 0 - - -    Assessment: Patient continues to benefit from care manager outreach for disease management and support.    Plan:  [facility] CM Care Plan Problem One     Most Recent Value  Care Plan Problem One  Support Needs for Self Health Management CHF  Role Documenting the Problem One  Health Coach  Care Plan for Problem One  Active  THN Long Term Goal   Over the next 31 days, patient will demonstrate and/or verbalize understanding of self health management for long term care of CHF  THN Long Term Goal Start Date  09/24/17 [goal continued]  Interventions for Problem One No Name reinforced with patient ways of monitoring heart failure, signs and symptoms, and when to notify physician.      RN CM will contact patient in the month of November and patient agrees to next outreach.  [name], RN, MSN [facility] Care Management Care Management Coordinator Direct Line [phone] Toll Free: [phone]  Fax: [phone]

## 2017-10-15 ENCOUNTER — Other Ambulatory Visit: Payer: Self-pay

## 2017-10-15 NOTE — Patient Outreach (Signed)
[facility]) Care Management  10/15/2017  Maria Fry 1951-05-12 [id number]    Telephone call to patient for monthly call.  No answer.  HIPAA compliant voice message left.  Plan: RN CM will attempt patient again in the month of November.   [name], RN, MSN [facility] Care Management Care Management Coordinator Direct Line [phone] Toll Free: [phone]  Fax: [phone]

## 2017-10-22 ENCOUNTER — Other Ambulatory Visit: Payer: Self-pay

## 2017-10-22 NOTE — Patient Outreach (Signed)
[facility]) Care Management  [place]  10/22/2017   Maria Fry June 28, 1951 [id number]  Subjective: Telephone call to patient for monthly call. Patient reports she is doing good.  Patient reports her weight is 189 lbs.  Patient denies any problems.  Discussed with patient heart failure and continuing regimen. No concerns.    Objective:   Encounter Medications:  Outpatient Encounter Medications as of 10/22/2017  Medication Sig  . acetaminophen (TYLENOL) 500 MG tablet Take 500 mg by mouth every 6 (six) hours as needed for mild pain or moderate pain. Reported on 04/22/2016  . aspirin 81 MG chewable tablet Chew 1 tablet (81 mg total) by mouth daily.  Marland Kitchen atorvastatin (LIPITOR) 80 MG tablet Take 1 tablet (80 mg total) by mouth daily at 6 PM.  . clopidogrel (PLAVIX) 75 MG tablet Take 1 tablet (75 mg total) by mouth daily.  Marland Kitchen gabapentin (NEURONTIN) 300 MG capsule Take 300 mg by mouth at bedtime.   Marland Kitchen KLOR-CON M15 15 MEQ tablet TAKE ONE TABLET BY MOUTH TWICE DAILY  . levothyroxine (SYNTHROID, LEVOTHROID) 100 MCG tablet take 1 tablet by mouth once daily BEFORE BREAKFAST (Patient taking differently: Take 100 mcg by mouth daily before breakfast. )  . loratadine (CLARITIN) 10 MG tablet Take 10 mg by mouth daily as needed for allergies.  . metoprolol tartrate (LOPRESSOR) 25 MG tablet TAKE 25 MG BY MOUTH TWICE DAILY  . nitroGLYCERIN (NITROSTAT) 0.4 MG SL tablet Place 1 tablet (0.4 mg total) under the tongue every 5 (five) minutes as needed for chest pain.  . ranolazine (RANEXA) 500 MG 12 hr tablet Take 1 tablet (500 mg total) by mouth 2 (two) times daily.   No facility-administered encounter medications on file as of 10/22/2017.     Functional Status:  No flowsheet data found.  Fall/Depression Screening: Fall Risk  10/22/2017 09/24/2017 08/11/2017  Falls in the past year? No No No  Number falls in past yr: - - -  Injury with Fall? - - -  Risk Factor Category  - - -   Risk for fall due to : - - -  Follow up - - -   PHQ 2/9 Scores 09/24/2017 08/11/2017 07/10/2017 06/23/2017 06/18/2017 05/30/2017 05/01/2017  PHQ - 2 Score 0 0 0 0 0 0 0  PHQ- 9 Score - - - 0 - - -    Assessment: Patient continues to benefit from care manager outreach for disease management and support.    Plan:  [facility] CM Care Plan Problem One     Most Recent Value  Care Plan Problem One  Support Needs for Self Health Management CHF  Role Documenting the Problem One  Health Coach  Care Plan for Problem One  Active  THN Long Term Goal   Over the next 31 days, patient will demonstrate and/or verbalize understanding of self health management for long term care of CHF  THN Long Term Goal Start Date  09/24/17 [goal continued]  Interventions for Problem One Long Term Goal  RN CM discussed with patient heart failure and continuing to monitor for symptoms.       RN CM will contact patient in the month of December and patient agrees to next outreach.  [name], RN, MSN [facility] Care Management Care Management Coordinator Direct Line [phone] Toll Free: [phone]  Fax: [phone]

## 2017-11-13 ENCOUNTER — Other Ambulatory Visit: Payer: Self-pay

## 2017-11-13 NOTE — Patient Outreach (Signed)
[facility]) Care Management  [place]  11/13/2017   Maria Fry June 24, 1951 [id number]  Subjective:  Telephone call to patient for monthly call. Patient reports she is doing good and weight remains around 189 lbs.  Patient to see primary care doctor again in January for routine check up.  Discussed with patient heart failure signs and symptoms.  She verbalized understanding.    Objective:   Encounter Medications:  Outpatient Encounter Medications as of 11/13/2017  Medication Sig  . acetaminophen (TYLENOL) 500 MG tablet Take 500 mg by mouth every 6 (six) hours as needed for mild pain or moderate pain. Reported on 04/22/2016  . aspirin 81 MG chewable tablet Chew 1 tablet (81 mg total) by mouth daily.  Marland Kitchen atorvastatin (LIPITOR) 80 MG tablet Take 1 tablet (80 mg total) by mouth daily at 6 PM.  . clopidogrel (PLAVIX) 75 MG tablet Take 1 tablet (75 mg total) by mouth daily.  Marland Kitchen gabapentin (NEURONTIN) 300 MG capsule Take 300 mg by mouth at bedtime.   Marland Kitchen KLOR-CON M15 15 MEQ tablet TAKE ONE TABLET BY MOUTH TWICE DAILY  . levothyroxine (SYNTHROID, LEVOTHROID) 100 MCG tablet take 1 tablet by mouth once daily BEFORE BREAKFAST (Patient taking differently: Take 100 mcg by mouth daily before breakfast. )  . loratadine (CLARITIN) 10 MG tablet Take 10 mg by mouth daily as needed for allergies.  . metoprolol tartrate (LOPRESSOR) 25 MG tablet TAKE 25 MG BY MOUTH TWICE DAILY  . nitroGLYCERIN (NITROSTAT) 0.4 MG SL tablet Place 1 tablet (0.4 mg total) under the tongue every 5 (five) minutes as needed for chest pain.  . ranolazine (RANEXA) 500 MG 12 hr tablet Take 1 tablet (500 mg total) by mouth 2 (two) times daily.   No facility-administered encounter medications on file as of 11/13/2017.     Functional Status:  No flowsheet data found.  Fall/Depression Screening: Fall Risk  10/22/2017 09/24/2017 08/11/2017  Falls in the past year? No No No  Number falls in past yr: - - -   Injury with Fall? - - -  Risk Factor Category  - - -  Risk for fall due to : - - -  Follow up - - -   PHQ 2/9 Scores 09/24/2017 08/11/2017 07/10/2017 06/23/2017 06/18/2017 05/30/2017 05/01/2017  PHQ - 2 Score 0 0 0 0 0 0 0  PHQ- 9 Score - - - 0 - - -    Assessment: Patient continues to benefit from care manager outreach for disease management and support.    Plan:  [facility] CM Care Plan Problem One     Most Recent Value  Care Plan Problem One  Support Needs for Self Health Management CHF  Role Documenting the Problem One  Health Coach  Care Plan for Problem One  Active  THN Long Term Goal   Over the next 31 days, patient will demonstrate and/or verbalize understanding of self health management for long term care of CHF  THN Long Term Goal Start Date  11/13/17 [goal continued]  Interventions for Problem One Long Term Goal  RN CM reviewed with patient heart failure and continuing to monitor for symptoms.       RN CM will contact patient in the month of January and patient agrees to next outreach.  [name], RN, MSN [facility] Care Management Care Management Coordinator Direct Line [phone] Toll Free: [phone]  Fax: [phone]

## 2017-12-11 ENCOUNTER — Other Ambulatory Visit: Payer: Self-pay

## 2017-12-11 NOTE — Patient Outreach (Signed)
[facility]) Care Management  [place]  12/11/2017   Maria Fry December 21, 1950 [id number]  Subjective: Telephone call to patient for monthly call. Patient reports she is doing good. Patient to see primary care physician on 12-26-17.  Patient reports weight at 190 lbs.  Patient continues to limit salt intake.  Patient knows signs of heart failure and when to contact physician.  Discussed with patient case closure and patient status.  Patient in agreement.  No concerns.   Objective:   Encounter Medications:  Outpatient Encounter Medications as of 12/11/2017  Medication Sig  . acetaminophen (TYLENOL) 500 MG tablet Take 500 mg by mouth every 6 (six) hours as needed for mild pain or moderate pain. Reported on 04/22/2016  . aspirin 81 MG chewable tablet Chew 1 tablet (81 mg total) by mouth daily.  Marland Kitchen atorvastatin (LIPITOR) 80 MG tablet Take 1 tablet (80 mg total) by mouth daily at 6 PM.  . clopidogrel (PLAVIX) 75 MG tablet Take 1 tablet (75 mg total) by mouth daily.  Marland Kitchen gabapentin (NEURONTIN) 300 MG capsule Take 300 mg by mouth at bedtime.   Marland Kitchen KLOR-CON M15 15 MEQ tablet TAKE ONE TABLET BY MOUTH TWICE DAILY  . levothyroxine (SYNTHROID, LEVOTHROID) 100 MCG tablet take 1 tablet by mouth once daily BEFORE BREAKFAST (Patient taking differently: Take 100 mcg by mouth daily before breakfast. )  . loratadine (CLARITIN) 10 MG tablet Take 10 mg by mouth daily as needed for allergies.  . metoprolol tartrate (LOPRESSOR) 25 MG tablet TAKE 25 MG BY MOUTH TWICE DAILY  . nitroGLYCERIN (NITROSTAT) 0.4 MG SL tablet Place 1 tablet (0.4 mg total) under the tongue every 5 (five) minutes as needed for chest pain.  . ranolazine (RANEXA) 500 MG 12 hr tablet Take 1 tablet (500 mg total) by mouth 2 (two) times daily.   No facility-administered encounter medications on file as of 12/11/2017.     Functional Status:  No flowsheet data found.  Fall/Depression Screening: Fall Risk  12/11/2017 10/22/2017  09/24/2017  Falls in the past year? No No No  Number falls in past yr: - - -  Injury with Fall? - - -  Risk Factor Category  - - -  Risk for fall due to : - - -  Follow up - - -   PHQ 2/9 Scores 12/11/2017 09/24/2017 08/11/2017 07/10/2017 06/23/2017 06/18/2017 05/30/2017  PHQ - 2 Score 0 0 0 0 0 0 0  PHQ- 9 Score - - - - 0 - -    Assessment: Patient continues to benefit from care manager outreach for disease management and support.    Plan:  [facility] CM Care Plan Problem One     Most Recent Value  Care Plan Problem One  Support Needs for Self Health Management CHF  Role Documenting the Problem One  Health Coach  Care Plan for Problem One  Active  THN Long Term Goal   Over the next 31 days, patient will demonstrate and/or verbalize understanding of self health management for long term care of CHF  THN Long Term Goal Start Date  11/13/17 [goal continued]  Interventions for Problem One Long Term Goal  RN CM discussed with patient heart failure and continuing to monitor for symptoms.       RN CM will contact patient in the month of February and patient agrees to next outreach.  [name], RN, MSN [facility] Care Management Care Management Coordinator Direct Line [phone] Toll Free: [phone]  Fax:  [phone]   

## 2017-12-26 ENCOUNTER — Ambulatory Visit (INDEPENDENT_AMBULATORY_CARE_PROVIDER_SITE_OTHER): Payer: PPO | Admitting: Family Medicine

## 2017-12-26 ENCOUNTER — Encounter: Payer: Self-pay | Admitting: Family Medicine

## 2017-12-26 ENCOUNTER — Other Ambulatory Visit: Payer: Self-pay

## 2017-12-26 VITALS — BP 138/70 | HR 68 | Temp 98.1°F | Resp 14 | Ht 60.0 in | Wt 199.0 lb

## 2017-12-26 DIAGNOSIS — Z13820 Encounter for screening for osteoporosis: Secondary | ICD-10-CM | POA: Diagnosis not present

## 2017-12-26 DIAGNOSIS — I10 Essential (primary) hypertension: Secondary | ICD-10-CM

## 2017-12-26 DIAGNOSIS — I5042 Chronic combined systolic (congestive) and diastolic (congestive) heart failure: Secondary | ICD-10-CM | POA: Diagnosis not present

## 2017-12-26 DIAGNOSIS — E669 Obesity, unspecified: Secondary | ICD-10-CM | POA: Diagnosis not present

## 2017-12-26 DIAGNOSIS — Z0000 Encounter for general adult medical examination without abnormal findings: Secondary | ICD-10-CM

## 2017-12-26 DIAGNOSIS — R7302 Impaired glucose tolerance (oral): Secondary | ICD-10-CM | POA: Diagnosis not present

## 2017-12-26 DIAGNOSIS — I25118 Atherosclerotic heart disease of native coronary artery with other forms of angina pectoris: Secondary | ICD-10-CM

## 2017-12-26 MED ORDER — ZOSTER VAC RECOMB ADJUVANTED 50 MCG/0.5ML IM SUSR: 0.5000 mL | Freq: Once | INTRAMUSCULAR | 0 refills | Status: AC

## 2017-12-26 NOTE — Progress Notes (Signed)
Subjective:   Patient presents for Medicare Annual/Subsequent preventive examination.   Review Past Medical/Family/Social: Per EMR    Risk Factors  Current exercise habits: Goes to [facility] a days a week  Dietary issues discussed: Not eating heart healthy diet, weight up  9 lbs since cardiology visit in Sept   Cardiac risk factors: Obesity (BMI >= 30 kg/m2). CAD  Depression Screen  (Note: if answer to either of the following is "Yes", a more complete depression screening is indicated)  Over the past two weeks, have you felt down, depressed or hopeless? No Over the past two weeks, have you felt little interest or pleasure in doing things? No Have you lost interest or pleasure in daily life? No Do you often feel hopeless? No Do you cry easily over simple problems? No   Activities of Daily Living  In your present state of health, do you have any difficulty performing the following activities?:  Driving? No  Managing money? No  Feeding yourself? No  Getting from bed to chair? No  Climbing a flight of stairs? No  Preparing food and eating?: No  Bathing or showering? No  Getting dressed: No  Getting to the toilet? No  Using the toilet:No  Moving around from place to place: No  In the past year have you fallen or had a near fall?:No  Are you sexually active? No  Do you have more than one partner? No   Hearing Difficulties: No  Do you often ask people to speak up or repeat themselves? No  Do you experience ringing or noises in your ears? No Do you have difficulty understanding soft or whispered voices? No  Do you feel that you have a problem with memory? No Do you often misplace items? No  Do you feel safe at home? Yes  Cognitive Testing  Alert? Yes Normal Appearance?Yes  Oriented to person? Yes Place? Yes  Time? Yes  Recall of three objects? Yes  Can perform simple calculations? Yes  Displays appropriate judgment?Yes  Can read the correct time from a watch face?Yes   List  the Names of Other Physician/Practitioners you currently use:   Endocrinology, Cardiology    Screening Tests / Date Colonoscopy    Sept 2018, had 3 polyps, has repeat in 3 years                  Zostavax  Due Pneumonia- UTD  Mammogram Done Jan 2018, every 2  Years  Influenza Vaccine  UTD Tetanus/tdap - UTD 2012  ROS: GEN- denies fatigue, fever, weight loss,weakness, recent illness HEENT- denies eye drainage, change in vision, nasal discharge, CVS- denies chest pain, palpitations RESP- denies SOB, cough, wheeze ABD- denies N/V, change in stools, abd pain GU- denies dysuria, hematuria, dribbling, incontinence MSK- denies joint pain, muscle aches, injury Neuro- denies headache, dizziness, syncope, seizure activity  PHYSICAL: GEN- NAD, alert and oriented x3 HEENT- PERRL, EOMI, non injected sclera, pink conjunctiva, MMM, oropharynx clear Neck- Supple, no thryomegaly, no bruit  CVS- RRR, no murmur RESP-CTAB ABD-NABS,soft,NT,ND EXT- No edema Pulses- Radial, DP- 2+     Assessment:    Annual wellness medicare exam   Plan:    During the course of the visit the patient was educated and counseled about appropriate screening and preventive services including:  Pt to schedule Bone Density    Declines shingles vaccine due to cost  HTN- well controlled  CAD/CHF- discussed diet, weight gain affects on heart , continuie current meds and cardiology recommendations  Glucose intolerance, check A1C, with weight gain  .  Screen NEG  for depression.       Discussed advanced directives again she has paperwork at home   Fasting labs today   Patient Instructions (the written plan) was given to the patient.  Medicare Attestation  I have personally reviewed:  The patient's medical and social history  Their use of alcohol, tobacco or illicit drugs  Their current medications and supplements  The patient's functional ability including ADLs,fall risks, home safety risks, cognitive, and  hearing and visual impairment  Diet and physical activities  Evidence for depression or mood disorders  The patient's weight, height, BMI, and visual acuity have been recorded in the chart. I have made referrals, counseling, and provided education to the patient based on review of the above and I have provided the patient with a written personalized care plan for preventive services.

## 2017-12-26 NOTE — Patient Instructions (Addendum)
Shingles shot sent to pharmacy  Schedule your Bone Density  Schedule Eye visit  We will call with lab results  F/U 6 months

## 2017-12-27 ENCOUNTER — Encounter: Payer: Self-pay | Admitting: Family Medicine

## 2017-12-27 LAB — CBC WITH DIFFERENTIAL/PLATELET
Basophils Absolute: 19 cells/uL (ref 0–200)
Basophils Relative: 0.3 %
Eosinophils Absolute: 109 cells/uL (ref 15–500)
Eosinophils Relative: 1.7 %
HCT: 35.8 % (ref 35.0–45.0)
Hemoglobin: 11.9 g/dL (ref 11.7–15.5)
Lymphs Abs: 2259 cells/uL (ref 850–3900)
MCH: 30.6 pg (ref 27.0–33.0)
MCHC: 33.2 g/dL (ref 32.0–36.0)
MCV: 92 fL (ref 80.0–100.0)
MPV: 11.2 fL (ref 7.5–12.5)
Monocytes Relative: 9.5 %
Neutro Abs: 3405 cells/uL (ref 1500–7800)
Neutrophils Relative %: 53.2 %
Platelets: 253 10*3/uL (ref 140–400)
RBC: 3.89 10*6/uL (ref 3.80–5.10)
RDW: 13.3 % (ref 11.0–15.0)
Total Lymphocyte: 35.3 %
WBC mixed population: 608 cells/uL (ref 200–950)
WBC: 6.4 10*3/uL (ref 3.8–10.8)

## 2017-12-27 LAB — COMPREHENSIVE METABOLIC PANEL
AG Ratio: 1 (calc) (ref 1.0–2.5)
ALT: 10 U/L (ref 6–29)
AST: 17 U/L (ref 10–35)
Albumin: 3.6 g/dL (ref 3.6–5.1)
Alkaline phosphatase (APISO): 82 U/L (ref 33–130)
BUN/Creatinine Ratio: 17 (calc) (ref 6–22)
BUN: 17 mg/dL (ref 7–25)
CO2: 26 mmol/L (ref 20–32)
Calcium: 8.4 mg/dL — ABNORMAL LOW (ref 8.6–10.4)
Chloride: 106 mmol/L (ref 98–110)
Creat: 1.01 mg/dL — ABNORMAL HIGH (ref 0.50–0.99)
Globulin: 3.7 g/dL (calc) (ref 1.9–3.7)
Glucose, Bld: 88 mg/dL (ref 65–99)
Potassium: 4.5 mmol/L (ref 3.5–5.3)
Sodium: 138 mmol/L (ref 135–146)
Total Bilirubin: 0.4 mg/dL (ref 0.2–1.2)
Total Protein: 7.3 g/dL (ref 6.1–8.1)

## 2017-12-27 LAB — LIPID PANEL
Cholesterol: 114 mg/dL (ref <200)
HDL: 50 mg/dL — ABNORMAL LOW (ref >50)
LDL Cholesterol (Calc): 50 mg/dL (calc)
Non-HDL Cholesterol (Calc): 64 mg/dL (calc) (ref <130)
Total CHOL/HDL Ratio: 2.3 (calc) (ref <5.0)
Triglycerides: 55 mg/dL (ref <150)

## 2017-12-27 LAB — HEMOGLOBIN A1C
Hgb A1c MFr Bld: 5.4 % of total Hgb (ref <5.7)
Mean Plasma Glucose: 108 (calc)
eAG (mmol/L): 6 (calc)

## 2017-12-29 DIAGNOSIS — E890 Postprocedural hypothyroidism: Secondary | ICD-10-CM | POA: Diagnosis not present

## 2017-12-29 LAB — TSH: TSH: 4.12 mIU/L (ref 0.40–4.50)

## 2017-12-29 LAB — T4, FREE: Free T4: 1.5 ng/dL (ref 0.8–1.8)

## 2018-01-01 ENCOUNTER — Encounter: Payer: Self-pay | Admitting: *Deleted

## 2018-01-05 ENCOUNTER — Encounter: Payer: Self-pay | Admitting: "Endocrinology

## 2018-01-05 ENCOUNTER — Ambulatory Visit (INDEPENDENT_AMBULATORY_CARE_PROVIDER_SITE_OTHER): Payer: PPO | Admitting: "Endocrinology

## 2018-01-05 VITALS — BP 135/82 | HR 66 | Ht 60.0 in | Wt 199.0 lb

## 2018-01-05 DIAGNOSIS — E890 Postprocedural hypothyroidism: Secondary | ICD-10-CM

## 2018-01-05 MED ORDER — LEVOTHYROXINE SODIUM 100 MCG PO TABS: ORAL_TABLET | ORAL | 3 refills | Status: DC

## 2018-01-05 NOTE — Progress Notes (Signed)
Subjective:    Patient ID: Maria Fry, female    DOB: [birth year],    Past Medical History:  Diagnosis Date  . Acute on chronic combined systolic (congestive) and diastolic (congestive) heart failure ([place]) 04/23/2016.   EF 45% and grade 2 diastolic dysfunction.  Marland Kitchen CAD (coronary artery disease)    a. cath 06/07/15 Dist LAD 100%, mid LAD 10%.  . Essential hypertension   . Hip pain   . Hyperlipidemia   . Hypothyroidism   . MI (myocardial infarction) ([place]) 06/05/2015  . Vitamin D deficiency    Past Surgical History:  Procedure Laterality Date  . ABDOMINAL HYSTERECTOMY    . CARDIAC CATHETERIZATION N/A 06/06/2015   Procedure: Left Heart Cath and Coronary Angiography;  Surgeon: Peter M Martinique, MD;  Location: [place] [facility];  Service: Cardiovascular;  Laterality: N/A;  . CHOLECYSTECTOMY    . COLONOSCOPY    . COLONOSCOPY N/A 08/13/2017   Procedure: COLONOSCOPY;  Surgeon: Daneil Dolin, MD;  [facility];  Service: Endoscopy;  Laterality: N/A;  8:30 AM  . ESOPHAGOGASTRODUODENOSCOPY N/A 09/11/2016   Procedure: ESOPHAGOGASTRODUODENOSCOPY (EGD);  Surgeon: Daneil Dolin, MD;  [facility];  Service: Endoscopy;  Laterality: N/A;  7:45 am - moved to 10/11 @ 10:30  . Heel spurs    . MALONEY DILATION N/A 09/11/2016   Procedure: Venia Minks DILATION;  Surgeon: Daneil Dolin, MD;  [facility];  Service: Endoscopy;  Laterality: N/A;  . POLYPECTOMY  08/13/2017   Procedure: POLYPECTOMY;  Surgeon: Daneil Dolin, MD;  [facility];  Service: Endoscopy;;  colon  . SHOULDER ARTHROSCOPY    . TUBAL LIGATION     Social History   Socioeconomic History  . Marital status: Single    Spouse name: None  . Number of children: None  . Years of education: None  . Highest education level: None  Social Needs  . Financial resource strain: None  . Food insecurity - worry: None  . Food insecurity - inability: None  . Transportation needs - medical: None  .  Transportation needs - non-medical: None  Occupational History  . None  Tobacco Use  . Smoking status: Former Smoker    Types: Cigarettes    Start date: 12/02/1977    Last attempt to quit: 12/02/2002    Years since quitting: 15.1  . Smokeless tobacco: Never Used  Substance and Sexual Activity  . Alcohol use: No    Alcohol/week: 0.0 oz  . Drug use: No  . Sexual activity: Yes  Other Topics Concern  . None  Social History Narrative  . None   Outpatient Encounter Medications as of 01/05/2018  Medication Sig  . acetaminophen (TYLENOL) 500 MG tablet Take 500 mg by mouth every 6 (six) hours as needed for mild pain or moderate pain. Reported on 04/22/2016  . aspirin 81 MG chewable tablet Chew 1 tablet (81 mg total) by mouth daily.  Marland Kitchen atorvastatin (LIPITOR) 80 MG tablet Take 1 tablet (80 mg total) by mouth daily at 6 PM.  . clopidogrel (PLAVIX) 75 MG tablet Take 1 tablet (75 mg total) by mouth daily.  Marland Kitchen gabapentin (NEURONTIN) 300 MG capsule Take 300 mg by mouth at bedtime.   Marland Kitchen KLOR-CON M15 15 MEQ tablet TAKE ONE TABLET BY MOUTH TWICE DAILY  . levothyroxine (SYNTHROID, LEVOTHROID) 100 MCG tablet take 1 tablet by mouth once daily BEFORE BREAKFAST  . loratadine (CLARITIN) 10 MG tablet Take 10 mg  by mouth daily as needed for allergies.  . metoprolol tartrate (LOPRESSOR) 25 MG tablet TAKE 25 MG BY MOUTH TWICE DAILY  . nitroGLYCERIN (NITROSTAT) 0.4 MG SL tablet Place 1 tablet (0.4 mg total) under the tongue every 5 (five) minutes as needed for chest pain.  . ranolazine (RANEXA) 500 MG 12 hr tablet Take 1 tablet (500 mg total) by mouth 2 (two) times daily.  . [DISCONTINUED] levothyroxine (SYNTHROID, LEVOTHROID) 100 MCG tablet take 1 tablet by mouth once daily BEFORE BREAKFAST (Patient taking differently: Take 100 mcg by mouth daily before breakfast. )   No facility-administered encounter medications on file as of 01/05/2018.    ALLERGIES: Allergies  Allergen Reactions  . Vicodin  [Hydrocodone-Acetaminophen] Other (See Comments)    Severe nausea and diarrhea   . Demerol [Meperidine] Nausea And Vomiting  . Lisinopril Other (See Comments)    Throat swollen  . Lodine [Etodolac] Rash   VACCINATION STATUS: Immunization History  Administered Date(s) Administered  . Influenza,inj,Quad PF,6+ Mos 10/02/2015, 08/19/2016, 08/25/2017    Thyroid Problem  Presents for follow-up (She was given RAI therapy for Graves' disease in the middle of September 2016) visit. Patient reports no cold intolerance, diarrhea, fatigue, heat intolerance or palpitations. The symptoms have been improving. Past treatments include iodine 131. The treatment provided significant relief. Risk factors include prior iodine 131 therapy.     Review of Systems  Constitutional: Positive for unexpected weight change. Negative for fatigue.  HENT: Negative for trouble swallowing and voice change.   Eyes: Negative for visual disturbance.  Respiratory: Negative for cough, shortness of breath and wheezing.   Cardiovascular: Negative for chest pain, palpitations and leg swelling.  Gastrointestinal: Negative for diarrhea, nausea and vomiting.  Endocrine: Negative for cold intolerance, heat intolerance, polydipsia, polyphagia and polyuria.  Musculoskeletal: Negative for arthralgias and myalgias.  Skin: Negative for color change, pallor, rash and wound.  Neurological: Negative for seizures and headaches.  Psychiatric/Behavioral: Negative for confusion and suicidal ideas.    Objective:    BP 135/82   Pulse 66   Ht 5' (1.524 m)   Wt 199 lb (90.3 kg)   BMI 38.86 kg/m   Wt Readings from Last 3 Encounters:  01/05/18 199 lb (90.3 kg)  12/26/17 199 lb (90.3 kg)  08/25/17 194 lb (88 kg)    Physical Exam  Constitutional: She is oriented to person, place, and time. She appears well-developed.  HENT:  Head: Normocephalic and atraumatic.  Eyes: EOM are normal.  Neck: Normal range of motion. Neck supple. No  tracheal deviation present. No thyromegaly present.  Cardiovascular: Regular rhythm.  Pulmonary/Chest: Effort normal and breath sounds normal.  Abdominal: Soft. Bowel sounds are normal. There is no tenderness. There is no guarding.  Musculoskeletal: She exhibits no edema.  Neurological: She is alert and oriented to person, place, and time. She has normal reflexes. No cranial nerve deficit. Coordination normal.  Skin: Skin is warm and dry. No rash noted. No erythema. No pallor.  Psychiatric: She has a normal mood and affect. Judgment normal.    Results for orders placed or performed in visit on 12/26/17  CBC with Differential/Platelet  Result Value Ref Range   WBC 6.4 3.8 - 10.8 Thousand/uL   RBC 3.89 3.80 - 5.10 Million/uL   Hemoglobin 11.9 11.7 - 15.5 g/dL   HCT 35.8 35.0 - 45.0 %   MCV 92.0 80.0 - 100.0 fL   MCH 30.6 27.0 - 33.0 pg   MCHC 33.2 32.0 - 36.0 g/dL  RDW 13.3 11.0 - 15.0 %   Platelets 253 140 - 400 Thousand/uL   MPV 11.2 7.5 - 12.5 fL   Neutro Abs 3,405 1,500 - 7,800 cells/uL   Lymphs Abs 2,259 850 - 3,900 cells/uL   WBC mixed population 608 200 - 950 cells/uL   Eosinophils Absolute 109 15 - 500 cells/uL   Basophils Absolute 19 0 - 200 cells/uL   Neutrophils Relative % 53.2 %   Total Lymphocyte 35.3 %   Monocytes Relative 9.5 %   Eosinophils Relative 1.7 %   Basophils Relative 0.3 %  Comprehensive metabolic panel  Result Value Ref Range   Glucose, Bld 88 65 - 99 mg/dL   BUN 17 7 - 25 mg/dL   Creat 1.01 (H) 0.50 - 0.99 mg/dL   BUN/Creatinine Ratio 17 6 - 22 (calc)   Sodium 138 135 - 146 mmol/L   Potassium 4.5 3.5 - 5.3 mmol/L   Chloride 106 98 - 110 mmol/L   CO2 26 20 - 32 mmol/L   Calcium 8.4 (L) 8.6 - 10.4 mg/dL   Total Protein 7.3 6.1 - 8.1 g/dL   Albumin 3.6 3.6 - 5.1 g/dL   Globulin 3.7 1.9 - 3.7 g/dL (calc)   AG Ratio 1.0 1.0 - 2.5 (calc)   Total Bilirubin 0.4 0.2 - 1.2 mg/dL   Alkaline phosphatase (APISO) 82 33 - 130 U/L   AST 17 10 - 35 U/L    ALT 10 6 - 29 U/L  Hemoglobin A1c  Result Value Ref Range   Hgb A1c MFr Bld 5.4 <5.7 % of total Hgb   Mean Plasma Glucose 108 (calc)   eAG (mmol/L) 6.0 (calc)  Lipid panel  Result Value Ref Range   Cholesterol 114 <200 mg/dL   HDL 50 (L) >50 mg/dL   Triglycerides 55 <150 mg/dL   LDL Cholesterol (Calc) 50 mg/dL (calc)   Total CHOL/HDL Ratio 2.3 <5.0 (calc)   Non-HDL Cholesterol (Calc) 64 <130 mg/dL (calc)     Assessment & Plan:   1.  RAI induced Hypothyroidism:   She is status post RAI therapy in September 2016 For hyperthyroidism. -Her thyroid function tests are consistent with appropriate replacement, will continue levothyroxine 100 mcg p.o. every morning.    - We discussed about correct intake of levothyroxine, at fasting, with water, separated by at least 30 minutes from breakfast, and separated by more than 4 hours from calcium, iron, multivitamins, acid reflux medications (PPIs). -Patient is made aware of the fact that thyroid hormone replacement is needed for life, dose to be adjusted by periodic monitoring of thyroid function tests.  I advised patient to maintain close follow up with her PCP for primary care needs. Follow up plan: Return in about 1 year (around 01/05/2019) for follow up with pre-visit labs.  Glade Lloyd, MD Phone: [phone]  Fax: [phone]  -  This note was partially dictated with voice recognition software. Similar sounding words can be transcribed inadequately or may not  be corrected upon review.  01/05/2018, 10:00 AM

## 2018-01-09 ENCOUNTER — Other Ambulatory Visit: Payer: Self-pay

## 2018-01-09 NOTE — Patient Outreach (Signed)
[facility]) Care Management  01/09/2018  AHLANA SLAYDON 1951-07-25 [id number]   Telephone call to patient for case closure.  No answer.  HIPAA compliant voice message left.   Plan: RN CM will attempt patient again within 3 business days.    [name], RN, MSN [facility] Care Management Care Management Coordinator Direct Line [phone] Toll Free: [phone]  Fax: [phone]

## 2018-01-12 ENCOUNTER — Other Ambulatory Visit: Payer: Self-pay

## 2018-01-12 NOTE — Patient Outreach (Signed)
[facility]) Care Management  01/12/2018  MAKIYA JEUNE 1950/12/31 [id number]   2nd telephone call to patient for case closure. Female answered stating patient not in.  Advised CM would call another time.  Plan: RN CM will attempt patient again in 10 business days. RN CM will send letter to attempt outreach.    [name], RN, MSN [facility] Care Management Care Management Coordinator Direct Line [phone] Toll Free: [phone]  Fax: [phone]

## 2018-01-14 ENCOUNTER — Ambulatory Visit (HOSPITAL_COMMUNITY)
Admission: RE | Admit: 2018-01-14 | Discharge: 2018-01-14 | Disposition: A | Discharge status: Home / Self Care | Payer: PPO | Source: Ambulatory Visit | Attending: Family Medicine | Admitting: Family Medicine

## 2018-01-14 DIAGNOSIS — Z780 Asymptomatic menopausal state: Secondary | ICD-10-CM | POA: Diagnosis not present

## 2018-01-14 DIAGNOSIS — Z13820 Encounter for screening for osteoporosis: Secondary | ICD-10-CM | POA: Insufficient documentation

## 2018-01-15 ENCOUNTER — Encounter: Payer: Self-pay | Admitting: *Deleted

## 2018-01-23 ENCOUNTER — Other Ambulatory Visit: Payer: Self-pay

## 2018-01-23 NOTE — Patient Outreach (Signed)
[facility]) Care Management  01/23/2018  MAHIMA HOTTLE 1951/11/09 [id number]   3rd telephone call to patient for case  Closure.  No answer.  HIPAA compliant voice message left.    Plan: RN CM will close case and notify care management assistant case status.  [name], RN, MSN [facility] Care Management Care Management Coordinator Direct Line [phone] Toll Free: [phone]  Fax: [phone]

## 2018-01-26 DIAGNOSIS — R2689 Other abnormalities of gait and mobility: Secondary | ICD-10-CM | POA: Diagnosis not present

## 2018-01-26 DIAGNOSIS — G40019 Localization-related (focal) (partial) idiopathic epilepsy and epileptic syndromes with seizures of localized onset, intractable, without status epilepticus: Secondary | ICD-10-CM | POA: Diagnosis not present

## 2018-01-26 DIAGNOSIS — R42 Dizziness and giddiness: Secondary | ICD-10-CM | POA: Diagnosis not present

## 2018-01-26 DIAGNOSIS — G603 Idiopathic progressive neuropathy: Secondary | ICD-10-CM | POA: Diagnosis not present

## 2018-03-03 ENCOUNTER — Ambulatory Visit: Payer: PPO | Admitting: Cardiovascular Disease

## 2018-03-03 ENCOUNTER — Encounter: Payer: Self-pay | Admitting: Cardiovascular Disease

## 2018-03-03 VITALS — BP 116/84 | HR 71 | Ht 60.0 in | Wt 202.0 lb

## 2018-03-03 DIAGNOSIS — I10 Essential (primary) hypertension: Secondary | ICD-10-CM | POA: Diagnosis not present

## 2018-03-03 DIAGNOSIS — I5042 Chronic combined systolic (congestive) and diastolic (congestive) heart failure: Secondary | ICD-10-CM | POA: Diagnosis not present

## 2018-03-03 DIAGNOSIS — I38 Endocarditis, valve unspecified: Secondary | ICD-10-CM | POA: Diagnosis not present

## 2018-03-03 DIAGNOSIS — E785 Hyperlipidemia, unspecified: Secondary | ICD-10-CM

## 2018-03-03 DIAGNOSIS — I25118 Atherosclerotic heart disease of native coronary artery with other forms of angina pectoris: Secondary | ICD-10-CM | POA: Diagnosis not present

## 2018-03-03 MED ORDER — RANOLAZINE ER 500 MG PO TB12: 500.0000 mg | ORAL_TABLET | Freq: Two times a day (BID) | ORAL | 3 refills | Status: DC

## 2018-03-03 MED ORDER — RANOLAZINE ER 1000 MG PO TB12: 500.0000 mg | ORAL_TABLET | Freq: Two times a day (BID) | ORAL | 6 refills | Status: DC

## 2018-03-03 NOTE — Patient Instructions (Addendum)
Your physician wants you to follow-up in:3 months with Dr.Koneswaran     STOP Plavix    INCREASE Ranexa to 1,000 mg twice a day    No lab work or tests ordered today     If you need a refill on your cardiac medications before your next appointment, please call your pharmacy.      Thank you for choosing [facility] !

## 2018-03-03 NOTE — Progress Notes (Signed)
SUBJECTIVE: The patient presents for follow-up of coronary artery disease, hypertension, dyslipidemia, valvular heart disease, and chronic combined systolic and diastolic heart failure.  Echocardiogram 04/23/16 showed mildly reduced left ventricular systolic function, LVEF 95%, with no significant change from echo dated 06/06/15. There was moderate concentric LVH with grade 2 diastolic dysfunction and wall motion abnormalities c/w CAD, mild aortic, mild tricuspid, and moderate mitral regurgitation with severe left atrial dilatation.  12/26/17: Total cholesterol 114, triglycerides 55, HDL 50, LDL 50.  She underwent coronary angiography on 06/06/15 which demonstrated 100% distal LAD stenosis but otherwise had essentially normal coronary arteries.  She has been experiencing retrosternal chest pressure and tightness about 2-3 times per week.  Symptoms occur with exertion such as cooking and walking short distances.  She is also had left shoulder pain radiating into the left side of her neck and has experienced symptom alleviation with nitroglycerin.  ECG performed today which I reviewed demonstrated sinus rhythm with nonspecific T wave abnormalities and PVCs.    Review of Systems: As per "subjective", otherwise negative.  Allergies  Allergen Reactions  . Vicodin [Hydrocodone-Acetaminophen] Other (See Comments)    Severe nausea and diarrhea   . Demerol [Meperidine] Nausea And Vomiting  . Lisinopril Other (See Comments)    Throat swollen  . Lodine [Etodolac] Rash    Current Outpatient Medications  Medication Sig Dispense Refill  . acetaminophen (TYLENOL) 500 MG tablet Take 500 mg by mouth every 6 (six) hours as needed for mild pain or moderate pain. Reported on 04/22/2016    . aspirin 81 MG chewable tablet Chew 1 tablet (81 mg total) by mouth daily.    Marland Kitchen atorvastatin (LIPITOR) 80 MG tablet Take 1 tablet (80 mg total) by mouth daily at 6 PM. 90 tablet 3  . clopidogrel (PLAVIX) 75 MG  tablet Take 1 tablet (75 mg total) by mouth daily. 90 tablet 3  . gabapentin (NEURONTIN) 300 MG capsule Take 300 mg by mouth at bedtime.     Marland Kitchen KLOR-CON M15 15 MEQ tablet TAKE ONE TABLET BY MOUTH TWICE DAILY 60 tablet 11  . levothyroxine (SYNTHROID, LEVOTHROID) 100 MCG tablet take 1 tablet by mouth once daily BEFORE BREAKFAST 90 tablet 3  . loratadine (CLARITIN) 10 MG tablet Take 10 mg by mouth daily as needed for allergies.    . metoprolol tartrate (LOPRESSOR) 25 MG tablet TAKE 25 MG BY MOUTH TWICE DAILY 180 tablet 3  . nitroGLYCERIN (NITROSTAT) 0.4 MG SL tablet Place 1 tablet (0.4 mg total) under the tongue every 5 (five) minutes as needed for chest pain. 25 tablet 3  . ranolazine (RANEXA) 500 MG 12 hr tablet Take 1 tablet (500 mg total) by mouth 2 (two) times daily. 180 tablet 3   No current facility-administered medications for this visit.     Past Medical History:  Diagnosis Date  . Acute on chronic combined systolic (congestive) and diastolic (congestive) heart failure ([place]) 04/23/2016.   EF 45% and grade 2 diastolic dysfunction.  Marland Kitchen CAD (coronary artery disease)    a. cath 06/07/15 Dist LAD 100%, mid LAD 10%.  . Essential hypertension   . Hip pain   . Hyperlipidemia   . Hypothyroidism   . MI (myocardial infarction) ([place]) 06/05/2015  . Vitamin D deficiency     Past Surgical History:  Procedure Laterality Date  . ABDOMINAL HYSTERECTOMY    . CARDIAC CATHETERIZATION N/A 06/06/2015   Procedure: Left Heart Cath and Coronary Angiography;  Surgeon: Peter M Martinique,  MD;  Location: [place] [facility];  Service: Cardiovascular;  Laterality: N/A;  . CHOLECYSTECTOMY    . COLONOSCOPY    . COLONOSCOPY N/A 08/13/2017   Procedure: COLONOSCOPY;  Surgeon: Daneil Dolin, MD;  [facility];  Service: Endoscopy;  Laterality: N/A;  8:30 AM  . ESOPHAGOGASTRODUODENOSCOPY N/A 09/11/2016   Procedure: ESOPHAGOGASTRODUODENOSCOPY (EGD);  Surgeon: Daneil Dolin, MD;  [facility];   Service: Endoscopy;  Laterality: N/A;  7:45 am - moved to 10/11 @ 10:30  . Heel spurs    . MALONEY DILATION N/A 09/11/2016   Procedure: Venia Minks DILATION;  Surgeon: Daneil Dolin, MD;  [facility];  Service: Endoscopy;  Laterality: N/A;  . POLYPECTOMY  08/13/2017   Procedure: POLYPECTOMY;  Surgeon: Daneil Dolin, MD;  [facility];  Service: Endoscopy;;  colon  . SHOULDER ARTHROSCOPY    . TUBAL LIGATION      Social History   Socioeconomic History  . Marital status: Single    Spouse name: Not on file  . Number of children: Not on file  . Years of education: Not on file  . Highest education level: Not on file  Occupational History  . Not on file  Social Needs  . Financial resource strain: Not on file  . Food insecurity:    Worry: Not on file    Inability: Not on file  . Transportation needs:    Medical: Not on file    Non-medical: Not on file  Tobacco Use  . Smoking status: Former Smoker    Types: Cigarettes    Start date: 12/02/1977    Last attempt to quit: 12/02/2002    Years since quitting: 15.2  . Smokeless tobacco: Never Used  Substance and Sexual Activity  . Alcohol use: No    Alcohol/week: 0.0 oz  . Drug use: No  . Sexual activity: Yes  Lifestyle  . Physical activity:    Days per week: Not on file    Minutes per session: Not on file  . Stress: Not on file  Relationships  . Social connections:    Talks on phone: Not on file    Gets together: Not on file    Attends religious service: Not on file    Active member of club or organization: Not on file    Attends meetings of clubs or organizations: Not on file    Relationship status: Not on file  . Intimate partner violence:    Fear of current or ex partner: Not on file    Emotionally abused: Not on file    Physically abused: Not on file    Forced sexual activity: Not on file  Other Topics Concern  . Not on file  Social History Narrative  . Not on file     Vitals:   03/03/18 1017    BP: 116/84  Pulse: 71  SpO2: 99%  Weight: 202 lb (91.6 kg)  Height: 5' (1.524 m)    Wt Readings from Last 3 Encounters:  03/03/18 202 lb (91.6 kg)  01/05/18 199 lb (90.3 kg)  12/26/17 199 lb (90.3 kg)     PHYSICAL EXAM General: NAD HEENT: Normal. Neck: No JVD, no thyromegaly. Lungs: Clear to auscultation bilaterally with normal respiratory effort. CV: Regular rate and rhythm, normal S1/S2, no S3/S4, no murmur. No pretibial or periankle edema.  No carotid bruit.   Abdomen: Soft, nontender, no distention.  Neurologic: Alert and oriented.  Psych: Normal affect.  Skin: Normal. Musculoskeletal: No gross deformities.    ECG: Most recent ECG reviewed.   Labs: Lab Results  Component Value Date/Time   K 4.5 12/26/2017 09:03 AM   BUN 17 12/26/2017 09:03 AM   CREATININE 1.01 (H) 12/26/2017 09:03 AM   ALT 10 12/26/2017 09:03 AM   TSH 4.12 12/29/2017 08:05 AM   HGB 11.9 12/26/2017 09:03 AM     Lipids: Lab Results  Component Value Date/Time   LDLCALC 50 12/26/2017 09:03 AM   CHOL 114 12/26/2017 09:03 AM   TRIG 55 12/26/2017 09:03 AM   HDL 50 (L) 12/26/2017 09:03 AM       ASSESSMENT AND PLAN: 1. CAD with NSTEMI and distal 100% LAD stenosis with exertional angina: I will increase Ranexa to 1000 mg twice daily.  I will discontinue Plavix.  Continue aspirin, Lipitor, and metoprolol. Allergic to ACEI.  2. Essential HTN:  Controlled on present therapy.  No changes.  3. Dyslipidemia: Continue high intensity statin therapy with Lipitor 80 mg daily. Lipids from 12/2017 reviewed above.  4. Chronic systolic and diastolic heart failure: Euvolemic and no longer on Lasix.  5. Valvular heart disease: Will monitor with surveillance echocardiography.    Disposition: Follow up 3 months   Kate Sable, M.D., F.A.C.C.

## 2018-05-20 ENCOUNTER — Ambulatory Visit (INDEPENDENT_AMBULATORY_CARE_PROVIDER_SITE_OTHER): Payer: PPO | Admitting: Family Medicine

## 2018-05-20 ENCOUNTER — Other Ambulatory Visit: Payer: Self-pay

## 2018-05-20 ENCOUNTER — Encounter: Payer: Self-pay | Admitting: Family Medicine

## 2018-05-20 VITALS — BP 118/82 | HR 66 | Temp 98.6°F | Resp 14 | Ht 60.0 in | Wt 201.0 lb

## 2018-05-20 DIAGNOSIS — J069 Acute upper respiratory infection, unspecified: Secondary | ICD-10-CM | POA: Diagnosis not present

## 2018-05-20 DIAGNOSIS — J029 Acute pharyngitis, unspecified: Secondary | ICD-10-CM

## 2018-05-20 MED ORDER — AMOXICILLIN 500 MG PO CAPS: 500.0000 mg | ORAL_CAPSULE | Freq: Two times a day (BID) | ORAL | 0 refills | Status: DC

## 2018-05-20 NOTE — Progress Notes (Signed)
   Subjective:    Patient ID: Maria Fry, female    DOB: 22-Sep-1951, 67 y.o.   MRN: [id number]  Patient presents for Illness (x2 days- sinus pressure, post nasal drip, nonproductive ocugh, fever/ chills)  Pt here with headache, sinus pressure, drainage down throat. Minimal cough. Subjective fever this morning, yesterday morning.  +sick contacts with daughter  Has not used any OTC medicine  No SOB no chest pain  No diarrhea  She was sick a few weeks ago as well    Review Of Systems:  GEN- denies fatigue, fever, weight loss,weakness, recent illness HEENT- denies eye drainage, change in vision, +nasal discharge, CVS- denies chest pain, palpitations RESP- denies SOB, cough, wheeze ABD- denies N/V, change in stools, abd pain GU- denies dysuria, hematuria, dribbling, incontinence MSK- denies joint pain, muscle aches, injury Neuro- denies headache, dizziness, syncope, seizure activity       Objective:    BP 118/82   Pulse 66   Temp 98.6 F (37 C) (Oral)   Resp 14   Ht 5' (1.524 m)   Wt 201 lb (91.2 kg)   SpO2 98%   BMI 39.26 kg/m  GEN- NAD, alert and oriented x3 HEENT- PERRL, EOMI, non injected sclera, pink conjunctiva, MMM, enlared tonsils, no exudate, +post oropharynx erythema Neck- Supple,  Shotty LAD CVS- RRR, no murmur RESP-CTAB EXT- No edema Pulses- Radial, 2+        Assessment & Plan:      Problem List Items Addressed This Visit    None    Visit Diagnoses    Sore throat    -  Primary   Relevant Orders   STREP GROUP A AG, W/REFLEX TO CULT (Completed)   Upper respiratory tract infection, unspecified type       Treat as URI with Coridan, salt water spray, salt warter gargle for throat. Step neg await culture. given amox if not improved by Saturday can start       Note: This dictation was prepared with Dragon dictation along with smaller phrase technology. Any transcriptional errors that result from this process are unintentional.

## 2018-05-20 NOTE — Patient Instructions (Addendum)
Use Coricidan Nasal spray Salt water gargle  If you do not get better by the weekend, start antibiotics F/U as previous

## 2018-05-22 LAB — CULTURE, GROUP A STREP
MICRO NUMBER:: 90733949
SPECIMEN QUALITY:: ADEQUATE

## 2018-05-22 LAB — STREP GROUP A AG, W/REFLEX TO CULT: Streptococcus, Group A Screen (Direct): NOT DETECTED

## 2018-06-01 ENCOUNTER — Telehealth: Payer: Self-pay | Admitting: *Deleted

## 2018-06-01 MED ORDER — AZITHROMYCIN 250 MG PO TABS: ORAL_TABLET | ORAL | 0 refills | Status: DC

## 2018-06-01 NOTE — Telephone Encounter (Signed)
D/C amoxicllin Give zpak

## 2018-06-01 NOTE — Telephone Encounter (Signed)
Prescription sent to pharmacy.   Call placed to patient. LMTRC.  

## 2018-06-01 NOTE — Telephone Encounter (Signed)
Received call from patient.   Reports that she began Amoxicillin over the weekend for illness. States that she noted swelling in her eyelids and mouth/ throat. Reports that she did not take any more and is better today. Reports that she continues to have sore throat and requested MD to prescribe another ABTx.   MD please advise.

## 2018-06-02 NOTE — Telephone Encounter (Signed)
Call placed to patient. LMTRC.  

## 2018-06-03 NOTE — Telephone Encounter (Signed)
Multiple calls placed to patient with no answer and no return call.   Message to be closed.  

## 2018-06-05 ENCOUNTER — Ambulatory Visit: Payer: PPO | Admitting: Cardiovascular Disease

## 2018-06-05 ENCOUNTER — Encounter: Payer: Self-pay | Admitting: Cardiovascular Disease

## 2018-06-05 VITALS — BP 128/84 | HR 71 | Ht 60.0 in | Wt 210.0 lb

## 2018-06-05 DIAGNOSIS — I10 Essential (primary) hypertension: Secondary | ICD-10-CM | POA: Diagnosis not present

## 2018-06-05 DIAGNOSIS — E785 Hyperlipidemia, unspecified: Secondary | ICD-10-CM | POA: Diagnosis not present

## 2018-06-05 DIAGNOSIS — I5042 Chronic combined systolic (congestive) and diastolic (congestive) heart failure: Secondary | ICD-10-CM | POA: Diagnosis not present

## 2018-06-05 DIAGNOSIS — I38 Endocarditis, valve unspecified: Secondary | ICD-10-CM | POA: Diagnosis not present

## 2018-06-05 DIAGNOSIS — I252 Old myocardial infarction: Secondary | ICD-10-CM | POA: Diagnosis not present

## 2018-06-05 DIAGNOSIS — I25118 Atherosclerotic heart disease of native coronary artery with other forms of angina pectoris: Secondary | ICD-10-CM | POA: Diagnosis not present

## 2018-06-05 NOTE — Patient Instructions (Signed)
Your physician wants you to follow-up in: 6 months with Dr.Koneswaran You will receive a reminder letter in the mail two months in advance. If you don't receive a letter, please call our office to schedule the follow-up appointment.   Your physician recommends that you continue on your current medications as directed. Please refer to the Current Medication list given to you today.    If you need a refill on your cardiac medications before your next appointment, please call your pharmacy.     No testing or lab work today.     Thank you for choosing [facility] !

## 2018-06-05 NOTE — Progress Notes (Signed)
SUBJECTIVE: The patient presents for follow-up of coronary artery disease, valvular heart disease, and chronic systolic and diastolic heart failure.  Echocardiogram 04/23/16 showed mildly reduced left ventricular systolic function, LVEF 82%, with no significant change from echo dated 06/06/15. There was moderate concentric LVH with grade 2 diastolic dysfunction and wall motion abnormalities c/w CAD, mild aortic, mild tricuspid, and moderate mitral regurgitation with severe left atrial dilatation.  12/26/17: Total cholesterol 114, triglycerides55, HDL 50, LDL 50.  She underwent coronary angiography on 06/06/15 which demonstrated 100% distal LAD stenosis but otherwise had essentially normal coronary arteries.  At her last visit in April 2019, I increased ranolazine to 1000 mg twice daily.  The patient denies any symptoms of chest pain, palpitations, lightheadedness, dizziness, leg swelling, orthopnea, PND, and syncope. She was struggling with an upper respiratory tract infection and her PCP put her on amoxicillin.  She developed an allergic reaction to this and she was switched to azithromycin which she is currently taking.  She is a family reunion tomorrow at [facility].  There will be over 150 family members coming all the way from [place] and [place].  They try to do this every other year.  Her brother passed away this past 19-Mar-2023 of lung cancer.  There are only 5 daughters left and a family of 9 children.    Review of Systems: As per "subjective", otherwise negative.  Allergies  Allergen Reactions  . Vicodin [Hydrocodone-Acetaminophen] Other (See Comments)    Severe nausea and diarrhea   . Amoxicillin     edema  . Demerol [Meperidine] Nausea And Vomiting  . Lisinopril Other (See Comments)    Throat swollen  . Lodine [Etodolac] Rash    Current Outpatient Medications  Medication Sig Dispense Refill  . acetaminophen (TYLENOL) 500 MG tablet Take 500 mg by mouth every 6 (six)  hours as needed for mild pain or moderate pain. Reported on 04/22/2016    . aspirin 81 MG chewable tablet Chew 1 tablet (81 mg total) by mouth daily.    Marland Kitchen atorvastatin (LIPITOR) 80 MG tablet Take 1 tablet (80 mg total) by mouth daily at 6 PM. 90 tablet 3  . azithromycin (ZITHROMAX) 250 MG tablet Take (2) tablets by mouth on day 1, then take (1) tablet by mouth on days 2-5. 6 tablet 0  . gabapentin (NEURONTIN) 300 MG capsule Take 300 mg by mouth at bedtime.     Marland Kitchen KLOR-CON M15 15 MEQ tablet TAKE ONE TABLET BY MOUTH TWICE DAILY 60 tablet 11  . levothyroxine (SYNTHROID, LEVOTHROID) 100 MCG tablet take 1 tablet by mouth once daily BEFORE BREAKFAST 90 tablet 3  . loratadine (CLARITIN) 10 MG tablet Take 10 mg by mouth daily as needed for allergies.    . metoprolol tartrate (LOPRESSOR) 25 MG tablet TAKE 25 MG BY MOUTH TWICE DAILY 180 tablet 3  . nitroGLYCERIN (NITROSTAT) 0.4 MG SL tablet Place 1 tablet (0.4 mg total) under the tongue every 5 (five) minutes as needed for chest pain. 25 tablet 3  . ranolazine (RANEXA) 1000 MG SR tablet Take 1 tablet (1,000 mg total) by mouth 2 (two) times daily. 60 tablet 6   No current facility-administered medications for this visit.     Past Medical History:  Diagnosis Date  . Acute on chronic combined systolic (congestive) and diastolic (congestive) heart failure ([place]) 04/23/2016.   EF 45% and grade 2 diastolic dysfunction.  Marland Kitchen CAD (coronary artery disease)    a. cath  06/07/15 Dist LAD 100%, mid LAD 10%.  . Essential hypertension   . Hip pain   . Hyperlipidemia   . Hypothyroidism   . MI (myocardial infarction) ([place]) 06/05/2015  . Vitamin D deficiency     Past Surgical History:  Procedure Laterality Date  . ABDOMINAL HYSTERECTOMY    . CARDIAC CATHETERIZATION N/A 06/06/2015   Procedure: Left Heart Cath and Coronary Angiography;  Surgeon: Peter M Martinique, MD;  Location: [facility];  Service: Cardiovascular;  Laterality: N/A;  . CHOLECYSTECTOMY    .  COLONOSCOPY    . COLONOSCOPY N/A 08/13/2017   Procedure: COLONOSCOPY;  Surgeon: Daneil Dolin, MD;  [facility];  Service: Endoscopy;  Laterality: N/A;  8:30 AM  . ESOPHAGOGASTRODUODENOSCOPY N/A 09/11/2016   Procedure: ESOPHAGOGASTRODUODENOSCOPY (EGD);  Surgeon: Daneil Dolin, MD;  [facility];  Service: Endoscopy;  Laterality: N/A;  7:45 am - moved to 10/11 @ 10:30  . Heel spurs    . MALONEY DILATION N/A 09/11/2016   Procedure: Venia Minks DILATION;  Surgeon: Daneil Dolin, MD;  [facility];  Service: Endoscopy;  Laterality: N/A;  . POLYPECTOMY  08/13/2017   Procedure: POLYPECTOMY;  Surgeon: Daneil Dolin, MD;  [facility];  Service: Endoscopy;;  colon  . SHOULDER ARTHROSCOPY    . TUBAL LIGATION      Social History   Socioeconomic History  . Marital status: Single    Spouse name: Not on file  . Number of children: Not on file  . Years of education: Not on file  . Highest education level: Not on file  Occupational History  . Not on file  Social Needs  . Financial resource strain: Not on file  . Food insecurity:    Worry: Not on file    Inability: Not on file  . Transportation needs:    Medical: Not on file    Non-medical: Not on file  Tobacco Use  . Smoking status: Former Smoker    Types: Cigarettes    Start date: 12/02/1977    Last attempt to quit: 12/02/2002    Years since quitting: 15.5  . Smokeless tobacco: Never Used  Substance and Sexual Activity  . Alcohol use: No    Alcohol/week: 0.0 oz  . Drug use: No  . Sexual activity: Yes  Lifestyle  . Physical activity:    Days per week: Not on file    Minutes per session: Not on file  . Stress: Not on file  Relationships  . Social connections:    Talks on phone: Not on file    Gets together: Not on file    Attends religious service: Not on file    Active member of club or organization: Not on file    Attends meetings of clubs or organizations: Not on file    Relationship  status: Not on file  . Intimate partner violence:    Fear of current or ex partner: Not on file    Emotionally abused: Not on file    Physically abused: Not on file    Forced sexual activity: Not on file  Other Topics Concern  . Not on file  Social History Narrative  . Not on file     Vitals:   06/05/18 0852  BP: 128/84  Pulse: 71  SpO2: 94%  Weight: 210 lb (95.3 kg)  Height: 5' (1.524 m)    Wt Readings from Last 3 Encounters:  06/05/18 210 lb (95.3  kg)  05/20/18 201 lb (91.2 kg)  03/03/18 202 lb (91.6 kg)     PHYSICAL EXAM General: NAD HEENT: Normal. Neck: No JVD, no thyromegaly. Lungs: Clear to auscultation bilaterally with normal respiratory effort. CV: Regular rate and rhythm, normal S1/S2, no S3/S4, no murmur. No pretibial or periankle edema.  Abdomen: Soft, nontender, no distention.  Neurologic: Alert and oriented.  Psych: Normal affect. Skin: Normal. Musculoskeletal: No gross deformities.    ECG: Reviewed above under Subjective   Labs: Lab Results  Component Value Date/Time   K 4.5 12/26/2017 09:03 AM   BUN 17 12/26/2017 09:03 AM   CREATININE 1.01 (H) 12/26/2017 09:03 AM   ALT 10 12/26/2017 09:03 AM   TSH 4.12 12/29/2017 08:05 AM   HGB 11.9 12/26/2017 09:03 AM     Lipids: Lab Results  Component Value Date/Time   LDLCALC 50 12/26/2017 09:03 AM   CHOL 114 12/26/2017 09:03 AM   TRIG 55 12/26/2017 09:03 AM   HDL 50 (L) 12/26/2017 09:03 AM       ASSESSMENT AND PLAN:  1. CAD with NSTEMI and distal 100% LAD stenosis with exertional angina:  Symptomatically improved with increase of Ranexa to 1000 mg twice daily.  Continue aspirin, Lipitor and metoprolol.  She is allergic to ACE inhibitors.  2. Essential HTN:  Blood pressure is normal.  No changes to therapy.  3. Dyslipidemia: Continue high intensity statin therapy with Lipitor 80 mg daily.  Lipids from January 2019 reviewed above.  4. Chronic systolic and diastolic heart failure:  Euvolemic and no longer on Lasix.  Blood pressure is normal.  5. Valvular heart disease: Stable.  I will monitor with surveillance echocardiography.     Disposition: Follow up 6 months   Kate Sable, M.D., F.A.C.C.

## 2018-06-26 ENCOUNTER — Other Ambulatory Visit: Payer: Self-pay

## 2018-06-26 ENCOUNTER — Ambulatory Visit (INDEPENDENT_AMBULATORY_CARE_PROVIDER_SITE_OTHER): Payer: PPO | Admitting: Family Medicine

## 2018-06-26 ENCOUNTER — Encounter: Payer: Self-pay | Admitting: Family Medicine

## 2018-06-26 VITALS — BP 130/74 | HR 66 | Temp 98.1°F | Resp 12 | Ht 60.0 in | Wt 201.0 lb

## 2018-06-26 DIAGNOSIS — I25118 Atherosclerotic heart disease of native coronary artery with other forms of angina pectoris: Secondary | ICD-10-CM

## 2018-06-26 DIAGNOSIS — I10 Essential (primary) hypertension: Secondary | ICD-10-CM

## 2018-06-26 DIAGNOSIS — E669 Obesity, unspecified: Secondary | ICD-10-CM

## 2018-06-26 NOTE — Assessment & Plan Note (Signed)
Doing well, no chest pain or cardiac symptoms Discussed getting back into the GYM for exercise Continue to monitor diet Blood pressure controlled Fasting labs today   Obtain last note from Dr. Merlene Laughter

## 2018-06-26 NOTE — Patient Instructions (Signed)
F/U Physical end of January  Call for any concerns

## 2018-06-26 NOTE — Progress Notes (Signed)
   Subjective:    Patient ID: Maria Fry, female    DOB: [birth year], 67 y.o.   MRN: [id number]  Patient presents for Follow-up (is fasting)    Pt here to f/u chronic medical problems.    Recently saw her cardiologist. Reviewed the note  Her brother in law passed last week, and grandson was sent to prison yesterday   CAD/HTN- taking BP meds, no recent changes, on Lipitor LDL 75 in Jan, no concerns with medications    Will f/u Dr. [name] for thyroid    Dr. Merlene Laughter fills her gabapentin for myalgia parathestica      Review Of Systems:  GEN- denies fatigue, fever, weight loss,weakness, recent illness HEENT- denies eye drainage, change in vision, nasal discharge, CVS- denies chest pain, palpitations RESP- denies SOB, cough, wheeze ABD- denies N/V, change in stools, abd pain GU- denies dysuria, hematuria, dribbling, incontinence MSK- denies joint pain, muscle aches, injury Neuro- denies headache, dizziness, syncope, seizure activity       Objective:    BP 130/74   Pulse 66   Temp 98.1 F (36.7 C) (Oral)   Resp 12   Ht 5' (1.524 m)   Wt 201 lb (91.2 kg)   SpO2 96%   BMI 39.26 kg/m  GEN- NAD, alert and oriented x3 HEENT- PERRL, EOMI, non injected sclera, pink conjunctiva, MMM, oropharynx clear Neck- Supple, no thyromegaly CVS- RRR, no murmur RESP-CTAB ABD-NABS,soft,NT,ND EXT- No edema Pulses- Radial2+        Assessment & Plan:      Problem List Items Addressed This Visit      Unprioritized   CAD (coronary artery disease) - Primary    Doing well, no chest pain or cardiac symptoms Discussed getting back into the GYM for exercise Continue to monitor diet Blood pressure controlled Fasting labs today   Obtain last note from Dr. Merlene Laughter      Relevant Orders   CBC with Differential/Platelet   Comprehensive metabolic panel   Lipid panel   Hypertension   Relevant Orders   CBC with Differential/Platelet   Comprehensive metabolic panel   Obesity (BMI  30-39.9)      Note: This dictation was prepared with Dragon dictation along with smaller phrase technology. Any transcriptional errors that result from this process are unintentional.

## 2018-06-27 LAB — COMPREHENSIVE METABOLIC PANEL
AG Ratio: 1 (calc) (ref 1.0–2.5)
ALT: 9 U/L (ref 6–29)
AST: 15 U/L (ref 10–35)
Albumin: 3.5 g/dL — ABNORMAL LOW (ref 3.6–5.1)
Alkaline phosphatase (APISO): 70 U/L (ref 33–130)
BUN/Creatinine Ratio: 16 (calc) (ref 6–22)
BUN: 16 mg/dL (ref 7–25)
CO2: 26 mmol/L (ref 20–32)
Calcium: 8.5 mg/dL — ABNORMAL LOW (ref 8.6–10.4)
Chloride: 105 mmol/L (ref 98–110)
Creat: 1.01 mg/dL — ABNORMAL HIGH (ref 0.50–0.99)
Globulin: 3.4 g/dL (calc) (ref 1.9–3.7)
Glucose, Bld: 96 mg/dL (ref 65–99)
Potassium: 4.3 mmol/L (ref 3.5–5.3)
Sodium: 138 mmol/L (ref 135–146)
Total Bilirubin: 0.5 mg/dL (ref 0.2–1.2)
Total Protein: 6.9 g/dL (ref 6.1–8.1)

## 2018-06-27 LAB — LIPID PANEL
Cholesterol: 112 mg/dL (ref <200)
HDL: 51 mg/dL (ref >50)
LDL Cholesterol (Calc): 48 mg/dL (calc)
Non-HDL Cholesterol (Calc): 61 mg/dL (calc) (ref <130)
Total CHOL/HDL Ratio: 2.2 (calc) (ref <5.0)
Triglycerides: 51 mg/dL (ref <150)

## 2018-06-27 LAB — CBC WITH DIFFERENTIAL/PLATELET
Basophils Absolute: 31 cells/uL (ref 0–200)
Basophils Relative: 0.5 %
Eosinophils Absolute: 112 cells/uL (ref 15–500)
Eosinophils Relative: 1.8 %
HCT: 34.9 % — ABNORMAL LOW (ref 35.0–45.0)
Hemoglobin: 11.5 g/dL — ABNORMAL LOW (ref 11.7–15.5)
Lymphs Abs: 1916 cells/uL (ref 850–3900)
MCH: 31.5 pg (ref 27.0–33.0)
MCHC: 33 g/dL (ref 32.0–36.0)
MCV: 95.6 fL (ref 80.0–100.0)
MPV: 11.4 fL (ref 7.5–12.5)
Monocytes Relative: 8.5 %
Neutro Abs: 3615 cells/uL (ref 1500–7800)
Neutrophils Relative %: 58.3 %
Platelets: 236 10*3/uL (ref 140–400)
RBC: 3.65 10*6/uL — ABNORMAL LOW (ref 3.80–5.10)
RDW: 13.4 % (ref 11.0–15.0)
Total Lymphocyte: 30.9 %
WBC mixed population: 527 cells/uL (ref 200–950)
WBC: 6.2 10*3/uL (ref 3.8–10.8)

## 2018-06-29 ENCOUNTER — Encounter: Payer: Self-pay | Admitting: *Deleted

## 2018-07-08 ENCOUNTER — Other Ambulatory Visit: Payer: Self-pay | Admitting: *Deleted

## 2018-07-08 ENCOUNTER — Telehealth: Payer: Self-pay | Admitting: Cardiovascular Disease

## 2018-07-08 MED ORDER — RANOLAZINE ER 1000 MG PO TB12: 500.0000 mg | ORAL_TABLET | Freq: Two times a day (BID) | ORAL | 0 refills | Status: DC

## 2018-07-08 NOTE — Telephone Encounter (Signed)
Patient called to inform office that her pharmacy [facility]) suggest that she get generic ranexa. Patient want to verify with Dr. Raliegh Ip that this is okay. Please advise.

## 2018-07-08 NOTE — Telephone Encounter (Signed)
Yes, it is!

## 2018-07-08 NOTE — Telephone Encounter (Signed)
Patient informed. 

## 2018-07-08 NOTE — Telephone Encounter (Signed)
Please give pt a call-- has a question concerning her ranolazine (RANEXA) 1000 MG SR tablet [[id number]]

## 2018-08-03 ENCOUNTER — Other Ambulatory Visit: Payer: Self-pay | Admitting: Cardiovascular Disease

## 2018-09-11 ENCOUNTER — Other Ambulatory Visit: Payer: Self-pay | Admitting: Cardiovascular Disease

## 2018-11-02 ENCOUNTER — Other Ambulatory Visit: Payer: Self-pay | Admitting: Cardiovascular Disease

## 2018-11-17 ENCOUNTER — Other Ambulatory Visit: Payer: Self-pay | Admitting: Cardiovascular Disease

## 2018-11-17 MED ORDER — METOPROLOL TARTRATE 25 MG PO TABS: ORAL_TABLET | ORAL | 3 refills | Status: DC

## 2018-11-17 NOTE — Telephone Encounter (Signed)
Lopressor refilled. 

## 2018-11-17 NOTE — Telephone Encounter (Signed)
Needing refill sent in on metoprolol tartrate (LOPRESSOR) 25 MG tablet [[id number]]   to [place]  Scheduled to see SK in Jan.

## 2018-12-15 ENCOUNTER — Ambulatory Visit: Payer: PPO | Admitting: Cardiovascular Disease

## 2018-12-15 ENCOUNTER — Encounter: Payer: Self-pay | Admitting: Cardiovascular Disease

## 2018-12-15 VITALS — BP 132/72 | HR 68 | Ht 65.0 in | Wt 210.0 lb

## 2018-12-15 DIAGNOSIS — I10 Essential (primary) hypertension: Secondary | ICD-10-CM | POA: Diagnosis not present

## 2018-12-15 DIAGNOSIS — I5042 Chronic combined systolic (congestive) and diastolic (congestive) heart failure: Secondary | ICD-10-CM

## 2018-12-15 DIAGNOSIS — I38 Endocarditis, valve unspecified: Secondary | ICD-10-CM

## 2018-12-15 DIAGNOSIS — Z79899 Other long term (current) drug therapy: Secondary | ICD-10-CM | POA: Diagnosis not present

## 2018-12-15 DIAGNOSIS — I252 Old myocardial infarction: Secondary | ICD-10-CM | POA: Diagnosis not present

## 2018-12-15 DIAGNOSIS — I25118 Atherosclerotic heart disease of native coronary artery with other forms of angina pectoris: Secondary | ICD-10-CM | POA: Diagnosis not present

## 2018-12-15 DIAGNOSIS — E785 Hyperlipidemia, unspecified: Secondary | ICD-10-CM | POA: Diagnosis not present

## 2018-12-15 MED ORDER — ATORVASTATIN CALCIUM 80 MG PO TABS: 80.0000 mg | ORAL_TABLET | Freq: Every day | ORAL | 3 refills | Status: DC

## 2018-12-15 MED ORDER — NITROGLYCERIN 0.4 MG SL SUBL: SUBLINGUAL_TABLET | SUBLINGUAL | 3 refills | Status: DC

## 2018-12-15 MED ORDER — POTASSIUM CHLORIDE CRYS ER 15 MEQ PO TBCR: 15.0000 meq | EXTENDED_RELEASE_TABLET | Freq: Two times a day (BID) | ORAL | 3 refills | Status: DC

## 2018-12-15 MED ORDER — FUROSEMIDE 20 MG PO TABS: 20.0000 mg | ORAL_TABLET | Freq: Every day | ORAL | 3 refills | Status: DC

## 2018-12-15 MED ORDER — RANOLAZINE ER 1000 MG PO TB12: 1000.0000 mg | ORAL_TABLET | Freq: Two times a day (BID) | ORAL | 3 refills | Status: DC

## 2018-12-15 NOTE — Patient Instructions (Signed)
Medication Instructions:  Take Lasix 20 mg daily  All other meds stay the same If you need a refill on your cardiac medications before your next appointment, please call your pharmacy.   Lab work: BMET in 2 weeks  12/29/2018 If you have labs (blood work) drawn today and your tests are completely normal, you will receive your results only by: Marland Kitchen MyChart Message (if you have MyChart) OR . A paper copy in the mail If you have any lab test that is abnormal or we need to change your treatment, we will call you to review the results.  Testing/Procedures: None today  Follow-Up: At [facility], you and your health needs are our priority.  As part of our continuing mission to provide you with exceptional heart care, we have created designated Provider Care Teams.  These Care Teams include your primary Cardiologist (physician) and Advanced Practice Providers (APPs -  Physician Assistants and Nurse Practitioners) who all work together to provide you with the care you need, when you need it. You will need a follow up appointment in 6 months.  Please call our office 2 months in advance to schedule this appointment.  You may see Kate Sable, MD or one of the following Advanced Practice Providers on your designated Care Team:   Bernerd Pho, PA-C [facility]) . Ermalinda Barrios, PA-C ([facility])  Any Other Special Instructions Will Be Listed Below (If Applicable). none

## 2018-12-15 NOTE — Progress Notes (Signed)
SUBJECTIVE: The patient presents for follow-up of coronary artery disease, valvular heart disease, and chronic systolic and diastolic heart failure.  Echocardiogram 04/23/16 showed mildly reduced left ventricular systolic function, LVEF 78%, with no significant change from echo dated 06/06/15. There was moderate concentric LVH with grade 2 diastolic dysfunction and wall motion abnormalities c/w CAD, mild aortic, mild tricuspid, and moderate mitral regurgitation with severe left atrial dilatation.  She underwent coronary angiography on 06/06/15 which demonstrated 100% distal LAD stenosis but otherwise had essentially normal coronary arteries.  At a visit in April 2019, I increased ranolazine to 1000 mg twice daily.  She denies chest pain, palpitations, orthopnea, and paroxysmal nocturnal dyspnea.  She said if she sits for any extended period of time her legs and ankles and feet become swollen.  She would like to go back on Lasix.  She only gets short of breath if she heavily exerts herself.  She tries to avoid high sodium foods and does not cook with salt.    Review of Systems: As per "subjective", otherwise negative.  Allergies  Allergen Reactions  . Vicodin [Hydrocodone-Acetaminophen] Other (See Comments)    Severe nausea and diarrhea   . Amoxicillin     edema  . Demerol [Meperidine] Nausea And Vomiting  . Lisinopril Other (See Comments)    Throat swollen  . Lodine [Etodolac] Rash    Current Outpatient Medications  Medication Sig Dispense Refill  . acetaminophen (TYLENOL) 500 MG tablet Take 500 mg by mouth every 6 (six) hours as needed for mild pain or moderate pain. Reported on 04/22/2016    . aspirin 81 MG chewable tablet Chew 1 tablet (81 mg total) by mouth daily.    Marland Kitchen atorvastatin (LIPITOR) 80 MG tablet TAKE 1 TABLET BY MOUTH ONCE DAILY AT  6  PM 90 tablet 1  . gabapentin (NEURONTIN) 300 MG capsule Take 300 mg by mouth at bedtime.     Marland Kitchen KLOR-CON M15 15 MEQ tablet TAKE 1   TABLET BY MOUTH TWICE DAILY 90 tablet 1  . levothyroxine (SYNTHROID, LEVOTHROID) 100 MCG tablet take 1 tablet by mouth once daily BEFORE BREAKFAST 90 tablet 3  . loratadine (CLARITIN) 10 MG tablet Take 10 mg by mouth daily as needed for allergies.    . metoprolol tartrate (LOPRESSOR) 25 MG tablet TAKE 25 MG BY MOUTH TWICE DAILY 180 tablet 3  . nitroGLYCERIN (NITROSTAT) 0.4 MG SL tablet DISSOLVE ONE TABLET UNDER THE TONGUE EVERY 5 MINUTES AS NEEDED FOR CHEST PAIN.  DO NOT EXCEED A TOTAL OF 3 DOSES IN 15 MINUTES 25 tablet 3  . RANEXA 1000 MG SR tablet TAKE 1 TABLET BY MOUTH TWICE DAILY 180 tablet 0  . ranolazine (RANEXA) 1000 MG SR tablet Take 1 tablet (1,000 mg total) by mouth 2 (two) times daily. 28 tablet 0   No current facility-administered medications for this visit.     Past Medical History:  Diagnosis Date  . Acute on chronic combined systolic (congestive) and diastolic (congestive) heart failure ([place]) 04/23/2016.   EF 45% and grade 2 diastolic dysfunction.  Marland Kitchen CAD (coronary artery disease)    a. cath 06/07/15 Dist LAD 100%, mid LAD 10%.  . Essential hypertension   . Hip pain   . Hyperlipidemia   . Hypothyroidism   . MI (myocardial infarction) ([place]) 06/05/2015  . Vitamin D deficiency     Past Surgical History:  Procedure Laterality Date  . ABDOMINAL HYSTERECTOMY    . CARDIAC CATHETERIZATION N/A 06/06/2015  Procedure: Left Heart Cath and Coronary Angiography;  Surgeon: Peter M Martinique, MD;  Location: [place] [facility];  Service: Cardiovascular;  Laterality: N/A;  . CHOLECYSTECTOMY    . COLONOSCOPY    . COLONOSCOPY N/A 08/13/2017   Procedure: COLONOSCOPY;  Surgeon: Daneil Dolin, MD;  [facility];  Service: Endoscopy;  Laterality: N/A;  8:30 AM  . ESOPHAGOGASTRODUODENOSCOPY N/A 09/11/2016   Procedure: ESOPHAGOGASTRODUODENOSCOPY (EGD);  Surgeon: Daneil Dolin, MD;  [facility];  Service: Endoscopy;  Laterality: N/A;  7:45 am - moved to 10/11 @ 10:30  . Heel  spurs    . MALONEY DILATION N/A 09/11/2016   Procedure: Venia Minks DILATION;  Surgeon: Daneil Dolin, MD;  [facility];  Service: Endoscopy;  Laterality: N/A;  . POLYPECTOMY  08/13/2017   Procedure: POLYPECTOMY;  Surgeon: Daneil Dolin, MD;  [facility];  Service: Endoscopy;;  colon  . SHOULDER ARTHROSCOPY    . TUBAL LIGATION      Social History   Socioeconomic History  . Marital status: Single    Spouse name: Not on file  . Number of children: Not on file  . Years of education: Not on file  . Highest education level: Not on file  Occupational History  . Not on file  Social Needs  . Financial resource strain: Not on file  . Food insecurity:    Worry: Not on file    Inability: Not on file  . Transportation needs:    Medical: Not on file    Non-medical: Not on file  Tobacco Use  . Smoking status: Former Smoker    Types: Cigarettes    Start date: 12/02/1977    Last attempt to quit: 12/02/2002    Years since quitting: 16.0  . Smokeless tobacco: Never Used  Substance and Sexual Activity  . Alcohol use: No    Alcohol/week: 0.0 standard drinks  . Drug use: No  . Sexual activity: Yes  Lifestyle  . Physical activity:    Days per week: Not on file    Minutes per session: Not on file  . Stress: Not on file  Relationships  . Social connections:    Talks on phone: Not on file    Gets together: Not on file    Attends religious service: Not on file    Active member of club or organization: Not on file    Attends meetings of clubs or organizations: Not on file    Relationship status: Not on file  . Intimate partner violence:    Fear of current or ex partner: Not on file    Emotionally abused: Not on file    Physically abused: Not on file    Forced sexual activity: Not on file  Other Topics Concern  . Not on file  Social History Narrative  . Not on file     Vitals:   12/15/18 1259  BP: 132/72  Pulse: 68  SpO2: 96%  Weight: 210 lb (95.3 kg)    Height: 5\' 5"  (1.651 m)    Wt Readings from Last 3 Encounters:  12/15/18 210 lb (95.3 kg)  06/26/18 201 lb (91.2 kg)  06/05/18 210 lb (95.3 kg)     PHYSICAL EXAM General: NAD HEENT: Normal. Neck: No JVD, no thyromegaly. Lungs: Clear to auscultation bilaterally with normal respiratory effort. CV: Regular rate and rhythm, normal S1/S2, no S3/S4, no murmur.  Trace bilateral leg and peri-ankle edema.  No carotid bruit.  Abdomen: Soft, nontender, no distention.  Neurologic: Alert and oriented.  Psych: Normal affect. Skin: Normal. Musculoskeletal: No gross deformities.    ECG: Reviewed above under Subjective   Labs: Lab Results  Component Value Date/Time   K 4.3 06/26/2018 08:22 AM   BUN 16 06/26/2018 08:22 AM   CREATININE 1.01 (H) 06/26/2018 08:22 AM   ALT 9 06/26/2018 08:22 AM   TSH 4.12 12/29/2017 08:05 AM   HGB 11.5 (L) 06/26/2018 08:22 AM     Lipids: Lab Results  Component Value Date/Time   LDLCALC 48 06/26/2018 08:22 AM   CHOL 112 06/26/2018 08:22 AM   TRIG 51 06/26/2018 08:22 AM   HDL 51 06/26/2018 08:22 AM       ASSESSMENT AND PLAN: 1. CAD: History of NSTEMI and distal 100% LAD stenosis. Symptomatically stable with increase of Ranexa to 1000 mg twice daily in April 2019.  Continue aspirin, Lipitor and metoprolol.  She is allergic to ACE inhibitors.  2. Essential HTN: Blood pressure is normal.  No changes to therapy.  3. Dyslipidemia: Continue high intensity statin therapy with Lipitor 80 mg daily.  Lipids from July 2019 reviewed above.  4. Chronic systolic and diastolic heart failure:  Weight is up 9 pounds but does not appear to be in decompensated heart failure.  She has some trace bilateral leg edema.  I will start Lasix 20 mg daily.  She is on supplemental potassium.  I will check a basic metabolic panel within the next several days.  5. Valvular heart disease: Stable.  I will monitor with surveillance echocardiography.    Disposition:  Follow up 6 months   Kate Sable, M.D., F.A.C.C.

## 2018-12-29 ENCOUNTER — Other Ambulatory Visit: Payer: Self-pay | Admitting: "Endocrinology

## 2018-12-29 DIAGNOSIS — E7439 Other disorders of intestinal carbohydrate absorption: Secondary | ICD-10-CM | POA: Diagnosis not present

## 2018-12-29 DIAGNOSIS — E890 Postprocedural hypothyroidism: Secondary | ICD-10-CM | POA: Diagnosis not present

## 2018-12-30 LAB — COMPREHENSIVE METABOLIC PANEL
AG Ratio: 0.9 (calc) — ABNORMAL LOW (ref 1.0–2.5)
ALT: 10 U/L (ref 6–29)
AST: 14 U/L (ref 10–35)
Albumin: 3.4 g/dL — ABNORMAL LOW (ref 3.6–5.1)
Alkaline phosphatase (APISO): 72 U/L (ref 33–130)
BUN: 10 mg/dL (ref 7–25)
CO2: 28 mmol/L (ref 20–32)
Calcium: 8.7 mg/dL (ref 8.6–10.4)
Chloride: 105 mmol/L (ref 98–110)
Creat: 0.93 mg/dL (ref 0.50–0.99)
Globulin: 3.6 g/dL (calc) (ref 1.9–3.7)
Glucose, Bld: 93 mg/dL (ref 65–99)
Potassium: 4 mmol/L (ref 3.5–5.3)
Sodium: 140 mmol/L (ref 135–146)
Total Bilirubin: 0.4 mg/dL (ref 0.2–1.2)
Total Protein: 7 g/dL (ref 6.1–8.1)

## 2018-12-30 LAB — HEMOGLOBIN A1C
Hgb A1c MFr Bld: 5.3 % of total Hgb (ref <5.7)
Mean Plasma Glucose: 105 (calc)
eAG (mmol/L): 5.8 (calc)

## 2018-12-30 LAB — TSH: TSH: 4.2 mIU/L (ref 0.40–4.50)

## 2018-12-30 LAB — T4, FREE: Free T4: 1.3 ng/dL (ref 0.8–1.8)

## 2019-01-01 ENCOUNTER — Encounter: Payer: Self-pay | Admitting: Family Medicine

## 2019-01-01 ENCOUNTER — Other Ambulatory Visit: Payer: Self-pay

## 2019-01-01 ENCOUNTER — Ambulatory Visit (INDEPENDENT_AMBULATORY_CARE_PROVIDER_SITE_OTHER): Payer: PPO | Admitting: Family Medicine

## 2019-01-01 VITALS — BP 126/64 | HR 82 | Temp 98.1°F | Resp 14 | Ht 65.0 in | Wt 210.0 lb

## 2019-01-01 DIAGNOSIS — I25118 Atherosclerotic heart disease of native coronary artery with other forms of angina pectoris: Secondary | ICD-10-CM

## 2019-01-01 DIAGNOSIS — E669 Obesity, unspecified: Secondary | ICD-10-CM

## 2019-01-01 DIAGNOSIS — Z1239 Encounter for other screening for malignant neoplasm of breast: Secondary | ICD-10-CM

## 2019-01-01 DIAGNOSIS — Z0000 Encounter for general adult medical examination without abnormal findings: Secondary | ICD-10-CM | POA: Diagnosis not present

## 2019-01-01 DIAGNOSIS — Z23 Encounter for immunization: Secondary | ICD-10-CM

## 2019-01-01 LAB — CBC WITH DIFFERENTIAL/PLATELET
Absolute Monocytes: 650 cells/uL (ref 200–950)
Basophils Absolute: 29 cells/uL (ref 0–200)
Basophils Relative: 0.4 %
Eosinophils Absolute: 80 cells/uL (ref 15–500)
Eosinophils Relative: 1.1 %
HCT: 35.1 % (ref 35.0–45.0)
Hemoglobin: 11.5 g/dL — ABNORMAL LOW (ref 11.7–15.5)
Lymphs Abs: 1453 cells/uL (ref 850–3900)
MCH: 31.3 pg (ref 27.0–33.0)
MCHC: 32.8 g/dL (ref 32.0–36.0)
MCV: 95.6 fL (ref 80.0–100.0)
MPV: 11.3 fL (ref 7.5–12.5)
Monocytes Relative: 8.9 %
Neutro Abs: 5088 cells/uL (ref 1500–7800)
Neutrophils Relative %: 69.7 %
Platelets: 268 10*3/uL (ref 140–400)
RBC: 3.67 10*6/uL — ABNORMAL LOW (ref 3.80–5.10)
RDW: 13.5 % (ref 11.0–15.0)
Total Lymphocyte: 19.9 %
WBC: 7.3 10*3/uL (ref 3.8–10.8)

## 2019-01-01 LAB — LIPID PANEL
Cholesterol: 123 mg/dL
HDL: 45 mg/dL — ABNORMAL LOW
LDL Cholesterol (Calc): 65 mg/dL
Non-HDL Cholesterol (Calc): 78 mg/dL
Total CHOL/HDL Ratio: 2.7 (calc)
Triglycerides: 57 mg/dL

## 2019-01-01 MED ORDER — ZOSTER VAC RECOMB ADJUVANTED 50 MCG/0.5ML IM SUSR: 0.5000 mL | Freq: Once | INTRAMUSCULAR | 1 refills | Status: AC

## 2019-01-01 NOTE — Progress Notes (Signed)
Subjective:   Patient presents for Medicare Annual/Subsequent preventive examination.    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 CAD  Depression Screen  (Note: if answer to either of the following is "Yes", a more complete depression screening is indicated)  Over the past two weeks, have you felt down, depressed or hopeless? No Over the past two weeks, have you felt little interest or pleasure in doing things? No Have you lost interest or pleasure in daily life? No Do you often feel hopeless? No Do you cry easily over simple problems? No   Activities of Daily Living  In your present state of health, do you have any difficulty performing the following activities?:  Driving? No  Managing money? No  Feeding yourself? No  Getting from bed to chair? No  Climbing a flight of stairs? No  Preparing food and eating?: No  Bathing or showering? No  Getting dressed: No  Getting to the toilet? No  Using the toilet:No  Moving around from place to place: No  In the past year have you fallen or had a near fall?:No  Are you sexually active? No  Do you have more than one partner? No   Hearing Difficulties: No  Do you often ask people to speak up or repeat themselves? No  Do you experience ringing or noises in your ears? No Do you have difficulty understanding soft or whispered voices? No  Do you feel that you have a problem with memory? No Do you often misplace items? No  Do you feel safe at home? Yes  Cognitive Testing  Alert?  Yes Normal Appearance?Yes  Oriented to person? Yes Place? Yes  Time? Yes  Recall of three objects? Yes  Can perform simple calculations? Yes  Displays appropriate judgment?Yes  Can read the correct time from a watch face?Yes   List the Names of Other Physician/Practitioners you currently use:   Endocrinoloy - 2/4 Dr. [name]  Cardiology - Dr. Jacinta Shoe  Neurology- Dr. Merlene Laughter next appt 2/24    Screening Tests / Date Colonoscopy    UTD                 Zostavax  dUE  Mammogram  dUE  Influenza Vaccine  utd Tetanus/tdap  utd Bone density- UTD 2019 Pneumonia-due for prevnar 13   ROS:  GEN- denies fatigue, fever, weight loss,weakness, recent illness HEENT- denies eye drainage, change in vision, nasal discharge, CVS- denies chest pain, palpitations RESP- denies SOB, cough, wheeze ABD- denies N/V, change in stools, abd pain GU- denies dysuria, hematuria, dribbling, incontinence MSK- denies joint pain, muscle aches, injury Neuro- denies headache, dizziness, syncope, seizure activity  Physical: Vitals reviewed   GEN- NAD, alert and oriented x3 HEENT- PERRL, EOMI, non injected sclera, pink conjunctiva, MMM, oropharynx clear Neck- Supple, no thryomegaly, no bruit  CVS- RRR, no murmur RESP-CTAB ABD-NABS,soft,NT,ND EXT- No edema Pulses- Radial, DP- 2+    Assessment:    Annual wellness medicare exam  Plan:    During the course of the visit the patient was educated and counseled about appropriate screening and preventive services including:  Screening mammography pt to schedule Patient to schedule eye exam  Immunizations: #13 given Shingles vaccine. Prescription given to that she can get the vaccine at the pharmacy or Medicare part D.   Fall/depression/audit C screening negative  Patient has copy of advance directives at home she has not filled out yet.  Coronary artery disease hypertension well controlled reviewed last cardiology note at bedside with her  Thyroidism  followed by endocrinology reviewed her recent set of labs  Will obtain cholesterol and CBC with today's labs.  Obesity-discussed her diet she is up 9 pounds from her baseline but no sign of fluid overload.  Diet review for nutrition referral? Yes ____ Not Indicated __x__  Patient Instructions (the written plan) was given to the patient.  Medicare Attestation  I have personally reviewed:  The patient's medical and social history  Their use of alcohol, tobacco or illicit drugs  Their current medications and supplements  The patient's functional ability including ADLs,fall risks, home safety risks, cognitive, and hearing and visual impairment  Diet and physical activities  Evidence for depression or mood disorders  The patient's weight, height, BMI, and visual acuity have been recorded in the chart. I have made referrals, counseling, and provided education to the patient based on review of the above and I have provided the patient with a written personalized care plan for preventive services.    '

## 2019-01-01 NOTE — Patient Instructions (Addendum)
Schedule mammogram Schedule your eye exam  Pneumonia shot given  Shingles vaccine sent to pharmacy  F/U 6 months

## 2019-01-05 ENCOUNTER — Ambulatory Visit: Payer: PPO | Admitting: "Endocrinology

## 2019-01-07 ENCOUNTER — Encounter: Payer: Self-pay | Admitting: *Deleted

## 2019-01-21 ENCOUNTER — Encounter: Payer: Self-pay | Admitting: "Endocrinology

## 2019-01-21 ENCOUNTER — Ambulatory Visit: Payer: PPO | Admitting: "Endocrinology

## 2019-01-21 VITALS — BP 126/82 | HR 67 | Ht 65.0 in | Wt 210.0 lb

## 2019-01-21 DIAGNOSIS — E890 Postprocedural hypothyroidism: Secondary | ICD-10-CM | POA: Diagnosis not present

## 2019-01-21 MED ORDER — LEVOTHYROXINE SODIUM 100 MCG PO TABS: ORAL_TABLET | ORAL | 3 refills | Status: DC

## 2019-01-21 NOTE — Progress Notes (Signed)
Endocrinology follow-up note   Subjective:    Patient ID: Maria Fry, female    DOB: October 09, 1951,    Past Medical History:  Diagnosis Date  . Acute on chronic combined systolic (congestive) and diastolic (congestive) heart failure ([place]) 04/23/2016.   EF 45% and grade 2 diastolic dysfunction.  Marland Kitchen CAD (coronary artery disease)    a. cath 06/07/15 Dist LAD 100%, mid LAD 10%.  . Essential hypertension   . Hip pain   . Hyperlipidemia   . Hypothyroidism   . MI (myocardial infarction) ([place]) 06/05/2015  . Vitamin D deficiency    Past Surgical History:  Procedure Laterality Date  . ABDOMINAL HYSTERECTOMY    . CARDIAC CATHETERIZATION N/A 06/06/2015   Procedure: Left Heart Cath and Coronary Angiography;  Surgeon: Peter M Martinique, MD;  Location: [place] [facility];  Service: Cardiovascular;  Laterality: N/A;  . CHOLECYSTECTOMY    . COLONOSCOPY    . COLONOSCOPY N/A 08/13/2017   Procedure: COLONOSCOPY;  Surgeon: Daneil Dolin, MD;  [facility];  Service: Endoscopy;  Laterality: N/A;  8:30 AM  . ESOPHAGOGASTRODUODENOSCOPY N/A 09/11/2016   Procedure: ESOPHAGOGASTRODUODENOSCOPY (EGD);  Surgeon: Daneil Dolin, MD;  [facility];  Service: Endoscopy;  Laterality: N/A;  7:45 am - moved to 10/11 @ 10:30  . Heel spurs    . MALONEY DILATION N/A 09/11/2016   Procedure: Venia Minks DILATION;  Surgeon: Daneil Dolin, MD;  [facility];  Service: Endoscopy;  Laterality: N/A;  . POLYPECTOMY  08/13/2017   Procedure: POLYPECTOMY;  Surgeon: Daneil Dolin, MD;  [facility];  Service: Endoscopy;;  colon  . SHOULDER ARTHROSCOPY    . TUBAL LIGATION     Social History   Socioeconomic History  . Marital status: Single    Spouse name: Not on file  . Number of children: Not on file  . Years of education: Not on file  . Highest education level: Not on file  Occupational History  . Not on file  Social Needs  . Financial resource strain: Not on file  . Food  insecurity:    Worry: Not on file    Inability: Not on file  . Transportation needs:    Medical: Not on file    Non-medical: Not on file  Tobacco Use  . Smoking status: Former Smoker    Types: Cigarettes    Start date: 12/02/1977    Last attempt to quit: 12/02/2002    Years since quitting: 16.1  . Smokeless tobacco: Never Used  Substance and Sexual Activity  . Alcohol use: No    Alcohol/week: 0.0 standard drinks  . Drug use: No  . Sexual activity: Yes  Lifestyle  . Physical activity:    Days per week: Not on file    Minutes per session: Not on file  . Stress: Not on file  Relationships  . Social connections:    Talks on phone: Not on file    Gets together: Not on file    Attends religious service: Not on file    Active member of club or organization: Not on file    Attends meetings of clubs or organizations: Not on file    Relationship status: Not on file  Other Topics Concern  . Not on file  Social History Narrative  . Not on file   Outpatient Encounter Medications as of 01/21/2019  Medication Sig  . acetaminophen (TYLENOL) 500 MG tablet Take 500 mg by  mouth every 6 (six) hours as needed for mild pain or moderate pain. Reported on 04/22/2016  . aspirin 81 MG chewable tablet Chew 1 tablet (81 mg total) by mouth daily.  Marland Kitchen atorvastatin (LIPITOR) 80 MG tablet Take 1 tablet (80 mg total) by mouth at bedtime.  . furosemide (LASIX) 20 MG tablet Take 1 tablet (20 mg total) by mouth daily.  Marland Kitchen gabapentin (NEURONTIN) 300 MG capsule Take 300 mg by mouth at bedtime.   Marland Kitchen levothyroxine (SYNTHROID, LEVOTHROID) 100 MCG tablet take 1 tablet by mouth once daily BEFORE BREAKFAST  . loratadine (CLARITIN) 10 MG tablet Take 10 mg by mouth daily as needed for allergies.  . metoprolol tartrate (LOPRESSOR) 25 MG tablet TAKE 25 MG BY MOUTH TWICE DAILY  . nitroGLYCERIN (NITROSTAT) 0.4 MG SL tablet DISSOLVE ONE TABLET UNDER THE TONGUE EVERY 5 MINUTES AS NEEDED FOR CHEST PAIN.  DO NOT EXCEED A TOTAL OF 3  DOSES IN 15 MINUTES  . potassium chloride SA (KLOR-CON M15) 15 MEQ tablet Take 1 tablet (15 mEq total) by mouth 2 (two) times daily.  Marland Kitchen RANEXA 1000 MG SR tablet TAKE 1 TABLET BY MOUTH TWICE DAILY  . [DISCONTINUED] levothyroxine (SYNTHROID, LEVOTHROID) 100 MCG tablet take 1 tablet by mouth once daily BEFORE BREAKFAST   No facility-administered encounter medications on file as of 01/21/2019.    ALLERGIES: Allergies  Allergen Reactions  . Vicodin [Hydrocodone-Acetaminophen] Other (See Comments)    Severe nausea and diarrhea   . Amoxicillin     edema  . Demerol [Meperidine] Nausea And Vomiting  . Lisinopril Other (See Comments)    Throat swollen  . Lodine [Etodolac] Rash   VACCINATION STATUS: Immunization History  Administered Date(s) Administered  . Influenza, High Dose Seasonal PF 08/24/2018  . Influenza,inj,Quad PF,6+ Mos 10/02/2015, 08/19/2016, 08/25/2017  . Pneumococcal Conjugate-13 01/01/2019    Thyroid Problem  Presents for follow-up (She was given RAI therapy for Graves' disease in the middle of September 2016) visit. Patient reports no cold intolerance, diarrhea, fatigue, heat intolerance or palpitations. The symptoms have been improving. Past treatments include iodine 131. The treatment provided significant relief. Risk factors include prior iodine 131 therapy.     Review of Systems  Constitutional: Negative for fatigue and unexpected weight change.  HENT: Negative for trouble swallowing and voice change.   Eyes: Negative for visual disturbance.  Respiratory: Negative for cough, shortness of breath and wheezing.   Cardiovascular: Negative for chest pain, palpitations and leg swelling.  Gastrointestinal: Negative for diarrhea, nausea and vomiting.  Endocrine: Negative for cold intolerance, heat intolerance, polydipsia, polyphagia and polyuria.  Musculoskeletal: Negative for arthralgias and myalgias.  Skin: Negative for color change, pallor, rash and wound.  Neurological:  Negative for seizures and headaches.  Psychiatric/Behavioral: Negative for confusion and suicidal ideas.    Objective:    BP 126/82   Pulse 67   Ht 5\' 5"  (1.651 m)   Wt 210 lb (95.3 kg)   BMI 34.95 kg/m   Wt Readings from Last 3 Encounters:  01/21/19 210 lb (95.3 kg)  01/01/19 210 lb (95.3 kg)  12/15/18 210 lb (95.3 kg)    Physical Exam  Constitutional: She is oriented to person, place, and time. She appears well-developed.  HENT:  Head: Normocephalic and atraumatic.  Eyes: EOM are normal.  Neck: Normal range of motion. Neck supple. No tracheal deviation present. No thyromegaly present.  Cardiovascular: Regular rhythm.  Pulmonary/Chest: Effort normal and breath sounds normal.  Abdominal: Soft. Bowel sounds are normal. There  is no abdominal tenderness. There is no guarding.  Musculoskeletal:        General: No edema.  Neurological: She is alert and oriented to person, place, and time. She has normal reflexes. No cranial nerve deficit. Coordination normal.  Skin: Skin is warm and dry. No rash noted. No erythema. No pallor.  Psychiatric: She has a normal mood and affect. Judgment normal.    Results for orders placed or performed in visit on 01/01/19  CBC with Differential/Platelet  Result Value Ref Range   WBC 7.3 3.8 - 10.8 Thousand/uL   RBC 3.67 (L) 3.80 - 5.10 Million/uL   Hemoglobin 11.5 (L) 11.7 - 15.5 g/dL   HCT 35.1 35.0 - 45.0 %   MCV 95.6 80.0 - 100.0 fL   MCH 31.3 27.0 - 33.0 pg   MCHC 32.8 32.0 - 36.0 g/dL   RDW 13.5 11.0 - 15.0 %   Platelets 268 140 - 400 Thousand/uL   MPV 11.3 7.5 - 12.5 fL   Neutro Abs 5,088 1,500 - 7,800 cells/uL   Lymphs Abs 1,453 850 - 3,900 cells/uL   Absolute Monocytes 650 200 - 950 cells/uL   Eosinophils Absolute 80 15 - 500 cells/uL   Basophils Absolute 29 0 - 200 cells/uL   Neutrophils Relative % 69.7 %   Total Lymphocyte 19.9 %   Monocytes Relative 8.9 %   Eosinophils Relative 1.1 %   Basophils Relative 0.4 %  Lipid panel   Result Value Ref Range   Cholesterol 123 <200 mg/dL   HDL 45 (L) >50 mg/dL   Triglycerides 57 <150 mg/dL   LDL Cholesterol (Calc) 65 mg/dL (calc)   Total CHOL/HDL Ratio 2.7 <5.0 (calc)   Non-HDL Cholesterol (Calc) 78 <130 mg/dL (calc)     Assessment & Plan:   1.  RAI induced Hypothyroidism:   She is status post RAI therapy in September 2016 For hyperthyroidism. -Her previsit thyroid function tests are consistent with appropriate replacement.  She is advised to continue levothyroxine 100 mcg p.o. nightly.     - We discussed about the correct intake of her thyroid hormone, on empty stomach at fasting, with water, separated by at least 30 minutes from breakfast and other medications,  and separated by more than 4 hours from calcium, iron, multivitamins, acid reflux medications (PPIs). -Patient is made aware of the fact that thyroid hormone replacement is needed for life, dose to be adjusted by periodic monitoring of thyroid function tests.   I advised patient to maintain close follow up with her PCP for primary care needs. Follow up plan: Return in about 1 year (around 01/22/2020) for Follow up with Pre-visit Labs.  Glade Lloyd, MD Phone: [phone]  Fax: [phone]  -  This note was partially dictated with voice recognition software. Similar sounding words can be transcribed inadequately or may not  be corrected upon review.  01/21/2019, 9:13 AM

## 2019-01-24 ENCOUNTER — Emergency Department (HOSPITAL_COMMUNITY): Payer: PPO

## 2019-01-24 ENCOUNTER — Encounter (HOSPITAL_COMMUNITY): Payer: Self-pay | Admitting: Emergency Medicine

## 2019-01-24 ENCOUNTER — Emergency Department (HOSPITAL_COMMUNITY)
Admission: EM | Admit: 2019-01-24 | Discharge: 2019-01-24 | Disposition: A | Discharge status: Home / Self Care | Payer: PPO | Attending: Emergency Medicine | Admitting: Emergency Medicine

## 2019-01-24 ENCOUNTER — Other Ambulatory Visit: Payer: Self-pay

## 2019-01-24 DIAGNOSIS — I110 Hypertensive heart disease with heart failure: Secondary | ICD-10-CM | POA: Diagnosis not present

## 2019-01-24 DIAGNOSIS — Z79899 Other long term (current) drug therapy: Secondary | ICD-10-CM | POA: Diagnosis not present

## 2019-01-24 DIAGNOSIS — J810 Acute pulmonary edema: Secondary | ICD-10-CM | POA: Insufficient documentation

## 2019-01-24 DIAGNOSIS — E039 Hypothyroidism, unspecified: Secondary | ICD-10-CM | POA: Diagnosis not present

## 2019-01-24 DIAGNOSIS — B349 Viral infection, unspecified: Secondary | ICD-10-CM | POA: Insufficient documentation

## 2019-01-24 DIAGNOSIS — R110 Nausea: Secondary | ICD-10-CM | POA: Diagnosis not present

## 2019-01-24 DIAGNOSIS — Z87891 Personal history of nicotine dependence: Secondary | ICD-10-CM | POA: Diagnosis not present

## 2019-01-24 DIAGNOSIS — I5043 Acute on chronic combined systolic (congestive) and diastolic (congestive) heart failure: Secondary | ICD-10-CM | POA: Insufficient documentation

## 2019-01-24 DIAGNOSIS — J40 Bronchitis, not specified as acute or chronic: Secondary | ICD-10-CM

## 2019-01-24 DIAGNOSIS — I2510 Atherosclerotic heart disease of native coronary artery without angina pectoris: Secondary | ICD-10-CM | POA: Insufficient documentation

## 2019-01-24 DIAGNOSIS — Z7982 Long term (current) use of aspirin: Secondary | ICD-10-CM | POA: Insufficient documentation

## 2019-01-24 DIAGNOSIS — R0789 Other chest pain: Secondary | ICD-10-CM | POA: Diagnosis not present

## 2019-01-24 DIAGNOSIS — R05 Cough: Secondary | ICD-10-CM | POA: Diagnosis not present

## 2019-01-24 LAB — BASIC METABOLIC PANEL
Anion gap: 8 (ref 5–15)
BUN: 15 mg/dL (ref 8–23)
CO2: 24 mmol/L (ref 22–32)
Calcium: 8.1 mg/dL — ABNORMAL LOW (ref 8.9–10.3)
Chloride: 103 mmol/L (ref 98–111)
Creatinine, Ser: 0.99 mg/dL (ref 0.44–1.00)
GFR calc Af Amer: >60 mL/min (ref >60)
GFR calc non Af Amer: 60 mL/min — ABNORMAL LOW (ref >60)
Glucose, Bld: 98 mg/dL (ref 70–99)
Potassium: 4 mmol/L (ref 3.5–5.1)
Sodium: 135 mmol/L (ref 135–145)

## 2019-01-24 LAB — CBC
HCT: 37.3 % (ref 36.0–46.0)
Hemoglobin: 12.2 g/dL (ref 12.0–15.0)
MCH: 31 pg (ref 26.0–34.0)
MCHC: 32.7 g/dL (ref 30.0–36.0)
MCV: 94.9 fL (ref 80.0–100.0)
Platelets: 253 10*3/uL (ref 150–400)
RBC: 3.93 MIL/uL (ref 3.87–5.11)
RDW: 15.4 % (ref 11.5–15.5)
WBC: 6.4 10*3/uL (ref 4.0–10.5)
nRBC: 0 % (ref 0.0–0.2)

## 2019-01-24 LAB — BRAIN NATRIURETIC PEPTIDE: B Natriuretic Peptide: 1025 pg/mL — ABNORMAL HIGH (ref 0.0–100.0)

## 2019-01-24 LAB — TROPONIN I: Troponin I: <0.03 ng/mL (ref <0.03)

## 2019-01-24 MED ORDER — SODIUM CHLORIDE 0.9% FLUSH: 3.0000 mL | Freq: Once | INTRAVENOUS | Status: DC

## 2019-01-24 MED ORDER — FUROSEMIDE 10 MG/ML IJ SOLN
20.0000 mg | Freq: Once | INTRAMUSCULAR | Status: AC
  Administered 2019-01-24: 20 mg via INTRAVENOUS
  Filled 2019-01-24: qty 2

## 2019-01-24 NOTE — ED Notes (Signed)
Pt ambulated to the bathroom, O2 sat stayed between 92-98%, she said she was slightly SOB at the end of ambulation.

## 2019-01-24 NOTE — ED Provider Notes (Signed)
[facility] Emergency Department Provider Note MRN:  [id number]  Arrival date & time: 01/24/19     Chief Complaint   Cough and Chest Pain   History of Present Illness   Maria Fry is a 68 y.o. year-old female with a history of CAD, CHF presenting to the ED with chief complaint of cough and chest pain.  Patient has been experiencing cold-like symptoms for 1 week.  Nasal congestion, mild sore throat, cough that was initially dry but has become productive, chest congestion.  More recently experiencing dyspnea on exertion, central chest tightness.  Patient explains that this chest tightness does not feel like her prior heart attack, which was more of a pressure-like pain.  Endorsing intermittent nausea, no vomiting, no abdominal pain, no dysuria.  Fever to 100.6 this morning.  Symptoms are constant, no exacerbating or alleviating factors.  Review of Systems  A complete 10 system review of systems was obtained and all systems are negative except as noted in the HPI and PMH.   Patient's Health History    Past Medical History:  Diagnosis Date  . Acute on chronic combined systolic (congestive) and diastolic (congestive) heart failure ([place]) 04/23/2016.   EF 45% and grade 2 diastolic dysfunction.  Marland Kitchen CAD (coronary artery disease)    a. cath 06/07/15 Dist LAD 100%, mid LAD 10%.  . Essential hypertension   . Hip pain   . Hyperlipidemia   . Hypothyroidism   . MI (myocardial infarction) ([place]) 06/05/2015  . Vitamin D deficiency     Past Surgical History:  Procedure Laterality Date  . ABDOMINAL HYSTERECTOMY    . CARDIAC CATHETERIZATION N/A 06/06/2015   Procedure: Left Heart Cath and Coronary Angiography;  Surgeon: Peter M Martinique, MD;  Location: [place] [facility];  Service: Cardiovascular;  Laterality: N/A;  . CHOLECYSTECTOMY    . COLONOSCOPY    . COLONOSCOPY N/A 08/13/2017   Procedure: COLONOSCOPY;  Surgeon: Daneil Dolin, MD;  [facility];  Service: Endoscopy;   Laterality: N/A;  8:30 AM  . ESOPHAGOGASTRODUODENOSCOPY N/A 09/11/2016   Procedure: ESOPHAGOGASTRODUODENOSCOPY (EGD);  Surgeon: Daneil Dolin, MD;  [facility];  Service: Endoscopy;  Laterality: N/A;  7:45 am - moved to 10/11 @ 10:30  . Heel spurs    . MALONEY DILATION N/A 09/11/2016   Procedure: Venia Minks DILATION;  Surgeon: Daneil Dolin, MD;  [facility];  Service: Endoscopy;  Laterality: N/A;  . POLYPECTOMY  08/13/2017   Procedure: POLYPECTOMY;  Surgeon: Daneil Dolin, MD;  [facility];  Service: Endoscopy;;  colon  . SHOULDER ARTHROSCOPY    . TUBAL LIGATION      Family History  Problem Relation Age of Onset  . Heart failure Father        Deceased  . Heart attack Father        Deceased  . Stroke Sister        Deceased  . Heart failure Brother        Deceased  . Cancer Brother        Deceased  . Heart attack Brother        Deceased  . Colon cancer Neg Hx     Social History   Socioeconomic History  . Marital status: Single    Spouse name: Not on file  . Number of children: Not on file  . Years of education: Not on file  . Highest education level: Not on file  Occupational History  . Not on file  Social Needs  . Financial resource strain: Not on file  . Food insecurity:    Worry: Not on file    Inability: Not on file  . Transportation needs:    Medical: Not on file    Non-medical: Not on file  Tobacco Use  . Smoking status: Former Smoker    Types: Cigarettes    Start date: 12/02/1977    Last attempt to quit: 12/02/2002    Years since quitting: 16.1  . Smokeless tobacco: Never Used  Substance and Sexual Activity  . Alcohol use: No    Alcohol/week: 0.0 standard drinks  . Drug use: No  . Sexual activity: Yes  Lifestyle  . Physical activity:    Days per week: Not on file    Minutes per session: Not on file  . Stress: Not on file  Relationships  . Social connections:    Talks on phone: Not on file    Gets together: Not on  file    Attends religious service: Not on file    Active member of club or organization: Not on file    Attends meetings of clubs or organizations: Not on file    Relationship status: Not on file  . Intimate partner violence:    Fear of current or ex partner: Not on file    Emotionally abused: Not on file    Physically abused: Not on file    Forced sexual activity: Not on file  Other Topics Concern  . Not on file  Social History Narrative  . Not on file     Physical Exam  Vital Signs and Nursing Notes reviewed Vitals:   01/24/19 1300 01/24/19 1330  BP: (!) 154/98 (!) 148/85  Pulse: 85 78  Resp: (!) 33 (!) 22  Temp:    SpO2: 92% 93%    CONSTITUTIONAL: Well-appearing, NAD NEURO:  Alert and oriented x 3, no focal deficits EYES:  eyes equal and reactive ENT/NECK:  no LAD, no JVD CARDIO: Regular rate, well-perfused, normal S1 and S2 PULM:  CTAB no wheezing or rhonchi GI/GU:  normal bowel sounds, non-distended, non-tender MSK/SPINE:  No gross deformities, no edema SKIN:  no rash, atraumatic PSYCH:  Appropriate speech and behavior  Diagnostic and Interventional Summary    EKG Interpretation  Date/Time:  Sunday January 24 2019 11:38:44 EST Ventricular Rate:  75 PR Interval:  180 QRS Duration: 94 QT Interval:  394 QTC Calculation: 439 R Axis:   54 Text Interpretation:  Sinus rhythm with occasional Premature ventricular complexes Septal infarct , age undetermined Abnormal ECG Confirmed by Gerlene Fee [phone]) on 01/24/2019 12:05:40 PM      Labs Reviewed  BASIC METABOLIC PANEL - Abnormal; Notable for the following components:      Result Value   Calcium 8.1 (*)    GFR calc non Af Amer 60 (*)    All other components within normal limits  BRAIN NATRIURETIC PEPTIDE - Abnormal; Notable for the following components:   B Natriuretic Peptide 1,025.0 (*)    All other components within normal limits  CBC  TROPONIN I    DG Chest 2 View  Final Result      Medications    sodium chloride flush (NS) 0.9 % injection 3 mL (3 mLs Intravenous Not Given 01/24/19 1155)  furosemide (LASIX) injection 20 mg (20 mg Intravenous Given 01/24/19 1313)     Procedures Critical Care  ED Course and Medical Decision Making  I have reviewed the triage vital signs and the nursing notes.  Pertinent labs & imaging results that were available during my care of the patient were reviewed by me and considered in my medical decision making (see below for details).  Consistent with viral illness, possibly influenza.  Vital signs are normal, chest pain is atypical, EKG is reassuring.  Will obtain labs, troponin, chest x-ray to exclude secondary bacterial pneumonia.  Will ambulate with pulse ox.  Some mention of increased weight or fluid retention but no JVD, no lower extremity edema on exam.  Question of blood-tinged sputum today, but she is not tachycardic, no evidence of DVT, in the setting of infectious symptoms little to no concern for PE at this time.  Labs reveal normal troponin, BNP elevated at 1000.  Chest x-ray with mild pulmonary edema.  Patient given 20 mg Lasix IV with good urine production.  Did well on ambulatory assessment, did not drop her oxygen saturation below 90%.  No clear indication for admission or further testing at this time, favoring viral bronchitis causing a mild flare of CHF, which can be managed at home.  Still, admission was offered, but family preferred going home.  Reliable patient and family, who agreed to come back if symptoms do not improve or worsen.  Advised to call PCP to discuss Lasix dosing.  After the discussed management above, the patient was determined to be safe for discharge.  The patient was in agreement with this plan and all questions regarding their care were answered.  ED return precautions were discussed and the patient will return to the ED with any significant worsening of condition.  Barth Kirks. Sedonia Small, MD [place] [email or web address]  Final Clinical Impressions(s) / ED Diagnoses     ICD-10-CM   1. Bronchitis J40   2. Viral illness B34.9   3. Acute pulmonary edema [facility]) J81.0     ED Discharge Orders    None         Maudie Flakes, MD 01/24/19 1407

## 2019-01-24 NOTE — ED Triage Notes (Addendum)
Patient complains of cough and sinus congestion x 3 days. States fever of 100.9 at home. Also complains of tightness in chest. Increased weight and bilateral edema in lower extremities. States history of CHF.

## 2019-01-24 NOTE — Discharge Instructions (Addendum)
You were evaluated in the Emergency Department and after careful evaluation, we did not find any emergent condition requiring admission or further testing in the hospital.  Your symptoms today seem to be due to bronchitis caused by a viral illness.  This illness seems to be causing a mild flare of your heart failure.  After a dose of lasix here in the emergency department you were able to make a good amount of urine.  Please try to rest at home, you can use over the counter mucinex to help with the congestion.  Call your regular doctor to discuss your lasix dose.  Please return to the Emergency Department if you experience any worsening of your condition.  We encourage you to follow up with a primary care provider.  Thank you for allowing [place] to be a part of your care.

## 2019-01-26 ENCOUNTER — Ambulatory Visit (INDEPENDENT_AMBULATORY_CARE_PROVIDER_SITE_OTHER): Payer: PPO | Admitting: Family Medicine

## 2019-01-26 ENCOUNTER — Other Ambulatory Visit: Payer: Self-pay

## 2019-01-26 ENCOUNTER — Encounter: Payer: Self-pay | Admitting: Family Medicine

## 2019-01-26 VITALS — BP 128/68 | HR 60 | Temp 98.3°F | Resp 16 | Ht 65.0 in | Wt 205.0 lb

## 2019-01-26 DIAGNOSIS — R197 Diarrhea, unspecified: Secondary | ICD-10-CM | POA: Diagnosis not present

## 2019-01-26 DIAGNOSIS — J0100 Acute maxillary sinusitis, unspecified: Secondary | ICD-10-CM

## 2019-01-26 DIAGNOSIS — J209 Acute bronchitis, unspecified: Secondary | ICD-10-CM | POA: Diagnosis not present

## 2019-01-26 DIAGNOSIS — I5042 Chronic combined systolic (congestive) and diastolic (congestive) heart failure: Secondary | ICD-10-CM | POA: Diagnosis not present

## 2019-01-26 MED ORDER — PREDNISONE 20 MG PO TABS: 20.0000 mg | ORAL_TABLET | Freq: Every day | ORAL | 0 refills | Status: DC

## 2019-01-26 MED ORDER — ALBUTEROL SULFATE HFA 108 (90 BASE) MCG/ACT IN AERS: 2.0000 | INHALATION_SPRAY | RESPIRATORY_TRACT | 0 refills | Status: DC | PRN

## 2019-01-26 MED ORDER — AZITHROMYCIN 250 MG PO TABS: ORAL_TABLET | ORAL | 0 refills | Status: DC

## 2019-01-26 NOTE — Assessment & Plan Note (Signed)
Take lasix daily for next 3 days, then as needed

## 2019-01-26 NOTE — Patient Instructions (Signed)
Take lasix daily for next 3 days Take antibiotics, steroids and use inhaler Coricidin for cough Imodium for diarrhea  F/U if not improved

## 2019-01-26 NOTE — Patient Outreach (Signed)
[facility]) Care Management  01/26/2019  ROBINA HAMOR 1951-02-10 [id number]   Referral Date: 01/26/2019 Referral Source: [facility] referral Referral Reason: Needs assistance with co-pay of Ranexa   Outreach Attempt: No answer. HIPAA compliant voice message left.  Plan: RN CM will attempt within 4 business days and send letter.   [name], RN, MSN [facility] Care Management Care Management Coordinator Direct Line [phone] Toll Free: [phone]  Fax: [phone]

## 2019-01-26 NOTE — Progress Notes (Signed)
   Subjective:    Patient ID: Maria Fry, female    DOB: 08-02-1951, 68 y.o.   MRN: [id number]  Patient presents for ER F/U (chest congestion, productive cough with yellow tinged sputum with blood, fever/ chills, diarrhea)  Pt here for ER follow up, seen  Cough with blood tinged mucous, wheezing, SOB for past week. Had episode of severe diarrhea, chills, fever yesterday. Doesn't even feel the diarrhea about to move.  Decreased appetite, but eating soft foods, applesauce, pudding and then she has diarrhea. No vomiting.  ER note , Given Lasix due to high BNP, diagnosed with viral bronchitis Was not taking lasix daily only as needed  CBC was normal, CXR showed fluid but no PNA She has taken cold and flu medication   Review Of Systems:  GEN- denies fatigue, fever, weight loss,weakness, recent illness HEENT- denies eye drainage, change in vision, nasal discharge, CVS- denies chest pain, palpitations RESP- + SOB, +cough, +wheeze ABD- denies N/V, change in stools, abd pain GU- denies dysuria, hematuria, dribbling, incontinence MSK- denies joint pain, muscle aches, injury Neuro- denies headache, dizziness, syncope, seizure activity       Objective:    BP 128/68   Pulse 60   Temp 98.3 F (36.8 C) (Oral)   Resp 16   Ht 5\' 5"  (1.651 m)   Wt 205 lb (93 kg)   SpO2 94%   BMI 34.11 kg/m  GEN- NAD, alert and oriented x3 HEENT- PERRL, EOMI, non injected sclera, pink conjunctiva, MMM, oropharynx clear,+ maxillary sinus tenderness, TM clear no effusion, nares clear rhinorrhea  Neck- Supple, no LAD, no JVD  CVS- RRR, no murmur RESP- bilat scattered wheeze, normal WOB, no rales , no crackles  ABD-NABS,soft,NT,ND EXT- trace ankle t edema Pulses- Radial, DP- 2+        Assessment & Plan:      Problem List Items Addressed This Visit      Unprioritized   Chronic combined systolic and diastolic heart failure (HCC)    Take lasix daily for next 3 days, then as needed        Other  Visit Diagnoses    Acute bronchitis, unspecified organism    -  Primary   Prednsone low dose due to fluid overload, albuterol inhaler, zpak for sinusitis.   Acute maxillary sinusitis, recurrence not specified       nasal spray/allergy med   Relevant Medications   azithromycin (ZITHROMAX) 250 MG tablet   predniSONE (DELTASONE) 20 MG tablet   Diarrhea, unspecified type       okay to take immodium, keep hydrated       Note: This dictation was prepared with Dragon dictation along with smaller phrase technology. Any transcriptional errors that result from this process are unintentional.

## 2019-01-29 ENCOUNTER — Other Ambulatory Visit: Payer: Self-pay

## 2019-01-29 NOTE — Patient Outreach (Signed)
[facility]) Care Management  01/29/2019  ZARAYA DELAUDER 01-Oct-1951 [id number]   Referral Date: 01/26/2019 Referral Source: [facility] referral Referral Reason: Needs assistance with co-pay of Ranexa   Outreach Attempt: Spoke with patient.  She states she is feeling better today.  Discussed reason for referral.  She states that yes she is paying $260/3 months for Ranexa.  Discussed [facility] services and support.  However, she states she got a letter from Medicare recently for her Ranexa that stated that she qualifies for 50% reduction of her medication.  She states she wants to see what her cost will be when she refills for March before she proceeds. Advised patient to call CM back if she needs further assistance.  She verbalized understanding and declined any further needs at this time.    Plan: RN CM will close case.    [name], RN, MSN [place] [facility] Care Management Coordinator Direct Line [phone] Cell [phone] Toll Free: [phone]  Fax: [phone]

## 2019-02-01 DIAGNOSIS — G603 Idiopathic progressive neuropathy: Secondary | ICD-10-CM | POA: Diagnosis not present

## 2019-02-01 DIAGNOSIS — G40019 Localization-related (focal) (partial) idiopathic epilepsy and epileptic syndromes with seizures of localized onset, intractable, without status epilepticus: Secondary | ICD-10-CM | POA: Diagnosis not present

## 2019-02-01 DIAGNOSIS — R42 Dizziness and giddiness: Secondary | ICD-10-CM | POA: Diagnosis not present

## 2019-02-01 DIAGNOSIS — R2689 Other abnormalities of gait and mobility: Secondary | ICD-10-CM | POA: Diagnosis not present

## 2019-02-03 ENCOUNTER — Ambulatory Visit (HOSPITAL_COMMUNITY)
Admission: RE | Admit: 2019-02-03 | Discharge: 2019-02-03 | Disposition: A | Discharge status: Home / Self Care | Payer: PPO | Source: Ambulatory Visit | Attending: Family Medicine | Admitting: Family Medicine

## 2019-02-03 DIAGNOSIS — Z1231 Encounter for screening mammogram for malignant neoplasm of breast: Secondary | ICD-10-CM | POA: Insufficient documentation

## 2019-02-03 DIAGNOSIS — Z1239 Encounter for other screening for malignant neoplasm of breast: Secondary | ICD-10-CM

## 2019-02-04 ENCOUNTER — Other Ambulatory Visit (HOSPITAL_COMMUNITY): Payer: Self-pay | Admitting: Family Medicine

## 2019-02-04 DIAGNOSIS — R928 Other abnormal and inconclusive findings on diagnostic imaging of breast: Secondary | ICD-10-CM

## 2019-02-08 ENCOUNTER — Other Ambulatory Visit: Payer: Self-pay | Admitting: Cardiovascular Disease

## 2019-02-16 ENCOUNTER — Ambulatory Visit (HOSPITAL_COMMUNITY): Payer: PPO

## 2019-02-16 ENCOUNTER — Encounter (HOSPITAL_COMMUNITY): Payer: PPO

## 2019-03-09 ENCOUNTER — Ambulatory Visit (HOSPITAL_COMMUNITY)
Admission: RE | Admit: 2019-03-09 | Discharge: 2019-03-09 | Disposition: A | Discharge status: Home / Self Care | Payer: PPO | Source: Ambulatory Visit | Attending: Family Medicine | Admitting: Family Medicine

## 2019-03-09 ENCOUNTER — Other Ambulatory Visit (HOSPITAL_COMMUNITY): Payer: PPO

## 2019-03-09 ENCOUNTER — Other Ambulatory Visit: Payer: Self-pay

## 2019-03-09 DIAGNOSIS — S0101XA Laceration without foreign body of scalp, initial encounter: Secondary | ICD-10-CM | POA: Diagnosis not present

## 2019-03-09 DIAGNOSIS — N6321 Unspecified lump in the left breast, upper outer quadrant: Secondary | ICD-10-CM | POA: Diagnosis not present

## 2019-03-09 DIAGNOSIS — M170 Bilateral primary osteoarthritis of knee: Secondary | ICD-10-CM | POA: Diagnosis not present

## 2019-03-09 DIAGNOSIS — S71111A Laceration without foreign body, right thigh, initial encounter: Secondary | ICD-10-CM | POA: Diagnosis not present

## 2019-03-09 DIAGNOSIS — S52501A Unspecified fracture of the lower end of right radius, initial encounter for closed fracture: Secondary | ICD-10-CM | POA: Diagnosis not present

## 2019-03-09 DIAGNOSIS — R928 Other abnormal and inconclusive findings on diagnostic imaging of breast: Secondary | ICD-10-CM

## 2019-03-09 DIAGNOSIS — R922 Inconclusive mammogram: Secondary | ICD-10-CM | POA: Diagnosis not present

## 2019-03-09 DIAGNOSIS — S62001A Unspecified fracture of navicular [scaphoid] bone of right wrist, initial encounter for closed fracture: Secondary | ICD-10-CM | POA: Diagnosis not present

## 2019-03-29 ENCOUNTER — Other Ambulatory Visit: Payer: Self-pay | Admitting: "Endocrinology

## 2019-04-16 DIAGNOSIS — M542 Cervicalgia: Secondary | ICD-10-CM | POA: Diagnosis not present

## 2019-04-16 DIAGNOSIS — M25511 Pain in right shoulder: Secondary | ICD-10-CM | POA: Diagnosis not present

## 2019-04-16 DIAGNOSIS — I10 Essential (primary) hypertension: Secondary | ICD-10-CM | POA: Diagnosis not present

## 2019-04-16 DIAGNOSIS — R52 Pain, unspecified: Secondary | ICD-10-CM | POA: Diagnosis not present

## 2019-04-16 DIAGNOSIS — Z79899 Other long term (current) drug therapy: Secondary | ICD-10-CM | POA: Diagnosis not present

## 2019-05-06 ENCOUNTER — Other Ambulatory Visit: Payer: Self-pay | Admitting: Cardiovascular Disease

## 2019-05-10 ENCOUNTER — Ambulatory Visit (HOSPITAL_COMMUNITY)
Admission: RE | Admit: 2019-05-10 | Discharge: 2019-05-10 | Disposition: A | Discharge status: Home / Self Care | Payer: PPO | Source: Ambulatory Visit | Attending: Family Medicine | Admitting: Family Medicine

## 2019-05-10 ENCOUNTER — Other Ambulatory Visit: Payer: Self-pay

## 2019-05-10 ENCOUNTER — Ambulatory Visit (INDEPENDENT_AMBULATORY_CARE_PROVIDER_SITE_OTHER): Payer: PPO | Admitting: Family Medicine

## 2019-05-10 DIAGNOSIS — M25461 Effusion, right knee: Secondary | ICD-10-CM

## 2019-05-10 DIAGNOSIS — M5431 Sciatica, right side: Secondary | ICD-10-CM

## 2019-05-10 DIAGNOSIS — M459 Ankylosing spondylitis of unspecified sites in spine: Secondary | ICD-10-CM | POA: Diagnosis not present

## 2019-05-10 DIAGNOSIS — M546 Pain in thoracic spine: Secondary | ICD-10-CM | POA: Diagnosis not present

## 2019-05-10 DIAGNOSIS — M7989 Other specified soft tissue disorders: Secondary | ICD-10-CM | POA: Diagnosis not present

## 2019-05-10 DIAGNOSIS — M25561 Pain in right knee: Secondary | ICD-10-CM | POA: Diagnosis not present

## 2019-05-10 DIAGNOSIS — M545 Low back pain: Secondary | ICD-10-CM | POA: Diagnosis not present

## 2019-05-10 DIAGNOSIS — R609 Edema, unspecified: Secondary | ICD-10-CM | POA: Diagnosis not present

## 2019-05-10 MED ORDER — TRAMADOL HCL 50 MG PO TABS: 50.0000 mg | ORAL_TABLET | Freq: Three times a day (TID) | ORAL | 0 refills | Status: AC | PRN

## 2019-05-10 NOTE — Patient Instructions (Signed)
Get the xrays of your back and knee Take the tramadol for pain Take lasix 40mg  once a day for fluid F/U pending results
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
## 2019-05-11 ENCOUNTER — Encounter: Payer: Self-pay | Admitting: Family Medicine

## 2019-05-11 ENCOUNTER — Other Ambulatory Visit: Payer: Self-pay | Admitting: *Deleted

## 2019-05-11 DIAGNOSIS — M546 Pain in thoracic spine: Secondary | ICD-10-CM

## 2019-05-11 DIAGNOSIS — M5431 Sciatica, right side: Secondary | ICD-10-CM

## 2019-05-11 DIAGNOSIS — M25461 Effusion, right knee: Secondary | ICD-10-CM

## 2019-05-11 DIAGNOSIS — M1711 Unilateral primary osteoarthritis, right knee: Secondary | ICD-10-CM

## 2019-05-11 LAB — BASIC METABOLIC PANEL
BUN/Creatinine Ratio: 17 (calc) (ref 6–22)
BUN: 18 mg/dL (ref 7–25)
CO2: 25 mmol/L (ref 20–32)
Calcium: 8.5 mg/dL — ABNORMAL LOW (ref 8.6–10.4)
Chloride: 104 mmol/L (ref 98–110)
Creat: 1.09 mg/dL — ABNORMAL HIGH (ref 0.50–0.99)
Glucose, Bld: 92 mg/dL (ref 65–99)
Potassium: 4.6 mmol/L (ref 3.5–5.3)
Sodium: 137 mmol/L (ref 135–146)

## 2019-05-18 ENCOUNTER — Other Ambulatory Visit: Payer: Self-pay

## 2019-05-18 ENCOUNTER — Ambulatory Visit (INDEPENDENT_AMBULATORY_CARE_PROVIDER_SITE_OTHER): Payer: PPO | Admitting: Family Medicine

## 2019-05-18 ENCOUNTER — Encounter: Payer: Self-pay | Admitting: Family Medicine

## 2019-05-18 DIAGNOSIS — R609 Edema, unspecified: Secondary | ICD-10-CM | POA: Diagnosis not present

## 2019-05-18 DIAGNOSIS — M1711 Unilateral primary osteoarthritis, right knee: Secondary | ICD-10-CM

## 2019-05-18 DIAGNOSIS — M5136 Other intervertebral disc degeneration, lumbar region: Secondary | ICD-10-CM | POA: Diagnosis not present

## 2019-05-18 DIAGNOSIS — M179 Osteoarthritis of knee, unspecified: Secondary | ICD-10-CM | POA: Insufficient documentation

## 2019-05-18 DIAGNOSIS — M1710 Unilateral primary osteoarthritis, unspecified knee: Secondary | ICD-10-CM | POA: Insufficient documentation

## 2019-05-18 NOTE — Assessment & Plan Note (Signed)
Call orthopedics today we will get her in more urgently.  For her knee pain.  Continue tramadol until that appointment.

## 2019-05-18 NOTE — Assessment & Plan Note (Signed)
Continue lasix at 40 mg.  I am going to repeat her renal function and make sure her kidneys are tolerating this dose.  She has had improvement in her leg swelling.  We will also give her prescription for compression hose
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
## 2019-05-18 NOTE — Patient Instructions (Addendum)
Referral to orthopedics  We will call with lab results Continue lasix 40mg  once a day  Continue tramadol for pain Get the compression hose  F/U as previous

## 2019-05-19 LAB — BASIC METABOLIC PANEL
BUN/Creatinine Ratio: 17 (calc) (ref 6–22)
BUN: 19 mg/dL (ref 7–25)
CO2: 27 mmol/L (ref 20–32)
Calcium: 8.9 mg/dL (ref 8.6–10.4)
Chloride: 105 mmol/L (ref 98–110)
Creat: 1.14 mg/dL — ABNORMAL HIGH (ref 0.50–0.99)
Glucose, Bld: 87 mg/dL (ref 65–99)
Potassium: 4.8 mmol/L (ref 3.5–5.3)
Sodium: 141 mmol/L (ref 135–146)

## 2019-05-20 ENCOUNTER — Other Ambulatory Visit: Payer: Self-pay

## 2019-05-20 ENCOUNTER — Encounter: Payer: Self-pay | Admitting: Orthopaedic Surgery

## 2019-05-20 ENCOUNTER — Ambulatory Visit: Payer: PPO | Admitting: Orthopaedic Surgery

## 2019-05-20 VITALS — BP 150/95 | HR 76 | Temp 97.5°F | Ht 60.0 in | Wt 215.0 lb

## 2019-05-20 DIAGNOSIS — M25561 Pain in right knee: Secondary | ICD-10-CM

## 2019-05-20 DIAGNOSIS — G8929 Other chronic pain: Secondary | ICD-10-CM | POA: Diagnosis not present

## 2019-05-20 DIAGNOSIS — Z6841 Body Mass Index (BMI) 40.0 and over, adult: Secondary | ICD-10-CM | POA: Diagnosis not present

## 2019-05-20 NOTE — Progress Notes (Signed)
Subjective:    Patient ID: Maria Fry, female    DOB: March 16, 1951, 68 y.o.   MRN: [id number]  HPI She has a long history of right knee pain and of the left knee also. She has no trauma. She has swelling, popping and some giving way. She cannot take any NSAIDs.  She uses BioFreeze and heat. She has no trauma, no redness.  Her knee pain is getting worse.  She does not want injections, she does not want surgery.    I spent about 45 minutes talking to her about her knee and what may need to be done. She does not really want anything done but says she will ponder and consider what I have said.  I went over total knee and injections.  She asked appropriate questions. She is concerned about COVID-19 as she should be.    Review of Systems  Constitutional: Positive for activity change.  Respiratory: Positive for shortness of breath and wheezing. Negative for cough.   Cardiovascular: Positive for chest pain, palpitations and leg swelling.  Endocrine: Positive for cold intolerance and heat intolerance.  Musculoskeletal: Positive for arthralgias, back pain, gait problem, joint swelling and myalgias.  Allergic/Immunologic: Positive for environmental allergies and immunocompromised state.  Neurological: Negative for dizziness and headaches.  Hematological: Bruises/bleeds easily.  Psychiatric/Behavioral: The patient is nervous/anxious.   All other systems reviewed and are negative.  For Review of Systems, all other systems reviewed and are negative.  The following is a summary of the past history medically, past history surgically, known current medicines, social history and family history.  This information is gathered electronically by the computer from prior information and documentation.  I review this each visit and have found including this information at this point in the chart is beneficial and informative.   Past Medical History:  Diagnosis Date  . Acute on chronic combined systolic  (congestive) and diastolic (congestive) heart failure ([place]) 04/23/2016.   EF 45% and grade 2 diastolic dysfunction.  Marland Kitchen CAD (coronary artery disease)    a. cath 06/07/15 Dist LAD 100%, mid LAD 10%.  . Essential hypertension   . Hip pain   . Hyperlipidemia   . Hypothyroidism   . MI (myocardial infarction) ([place]) 06/05/2015  . Vitamin D deficiency     Past Surgical History:  Procedure Laterality Date  . ABDOMINAL HYSTERECTOMY    . CARDIAC CATHETERIZATION N/A 06/06/2015   Procedure: Left Heart Cath and Coronary Angiography;  Surgeon: Peter M Martinique, MD;  Location: [facility];  Service: Cardiovascular;  Laterality: N/A;  . CHOLECYSTECTOMY    . COLONOSCOPY    . COLONOSCOPY N/A 08/13/2017   Procedure: COLONOSCOPY;  Surgeon: Daneil Dolin, MD;  Location[facility];  Service: Endoscopy;  Laterality: N/A;  8:30 AM  . ESOPHAGOGASTRODUODENOSCOPY N/A 09/11/2016   Procedure: ESOPHAGOGASTRODUODENOSCOPY (EGD);  Surgeon: Daneil Dolin, MD;  Location: [facility];  Service: Endoscopy;  Laterality: N/A;  7:45 am - moved to 10/11 @ 10:30  . Heel spurs    . MALONEY DILATION N/A 09/11/2016   Procedure: Venia Minks DILATION;  Surgeon: Daneil Dolin, MD;  Location: [facility];  Service: Endoscopy;  Laterality: N/A;  . POLYPECTOMY  08/13/2017   Procedure: POLYPECTOMY;  Surgeon: Daneil Dolin, MD;  Location: [facility];  Service: Endoscopy;;  colon  . SHOULDER ARTHROSCOPY    . TUBAL LIGATION      Current Outpatient Medications on File Prior to Visit  Medication Sig  Dispense Refill  . acetaminophen (TYLENOL) 500 MG tablet Take 500 mg by mouth every 6 (six) hours as needed for mild pain or moderate pain. Reported on 04/22/2016    . aspirin 81 MG chewable tablet Chew 1 tablet (81 mg total) by mouth daily.    Marland Kitchen atorvastatin (LIPITOR) 80 MG tablet Take 1 tablet (80 mg total) by mouth at bedtime. 90 tablet 3  . furosemide (LASIX) 20 MG tablet Take 1 tablet (20 mg total) by mouth daily. 90 tablet 3   . gabapentin (NEURONTIN) 300 MG capsule Take 300 mg by mouth at bedtime.     Marland Kitchen levothyroxine (SYNTHROID) 100 MCG tablet TAKE 1 TABLET BY MOUTH ONCE DAILY BEFORE BREAKFAST 90 tablet 0  . loratadine (CLARITIN) 10 MG tablet Take 10 mg by mouth daily as needed for allergies.    . nitroGLYCERIN (NITROSTAT) 0.4 MG SL tablet DISSOLVE ONE TABLET UNDER THE TONGUE EVERY 5 MINUTES AS NEEDED FOR CHEST PAIN.  DO NOT EXCEED A TOTAL OF 3 DOSES IN 15 MINUTES 25 tablet 3  . potassium chloride SA (KLOR-CON M15) 15 MEQ tablet Take 1 tablet (15 mEq total) by mouth 2 (two) times daily. 180 tablet 3  . RANEXA 1000 MG SR tablet Take 1 tablet by mouth twice daily 180 tablet 0  . metoprolol tartrate (LOPRESSOR) 25 MG tablet TAKE 25 MG BY MOUTH TWICE DAILY (Patient not taking: Reported on 05/20/2019) 180 tablet 3   No current facility-administered medications on file prior to visit.     Social History   Socioeconomic History  . Marital status: Single    Spouse name: Not on file  . Number of children: Not on file  . Years of education: Not on file  . Highest education level: Not on file  Occupational History  . Not on file  Social Needs  . Financial resource strain: Not on file  . Food insecurity    Worry: Not on file    Inability: Not on file  . Transportation needs    Medical: Not on file    Non-medical: Not on file  Tobacco Use  . Smoking status: Former Smoker    Types: Cigarettes    Start date: 12/02/1977    Quit date: 12/02/2002    Years since quitting: 16.4  . Smokeless tobacco: Never Used  Substance and Sexual Activity  . Alcohol use: No    Alcohol/week: 0.0 standard drinks  . Drug use: No  . Sexual activity: Yes  Lifestyle  . Physical activity    Days per week: Not on file    Minutes per session: Not on file  . Stress: Not on file  Relationships  . Social Herbalist on phone: Not on file    Gets together: Not on file    Attends religious service: Not on file    Active member of  club or organization: Not on file    Attends meetings of clubs or organizations: Not on file    Relationship status: Not on file  . Intimate partner violence    Fear of current or ex partner: Not on file    Emotionally abused: Not on file    Physically abused: Not on file    Forced sexual activity: Not on file  Other Topics Concern  . Not on file  Social History Narrative  . Not on file    Family History  Problem Relation Age of Onset  . Heart failure Father  Deceased  . Heart attack Father        Deceased  . Stroke Sister        Deceased  . Heart failure Brother        Deceased  . Cancer Brother        Deceased  . Heart attack Brother        Deceased  . Colon cancer Neg Hx     BP (!) 150/95   Pulse 76   Temp (!) 97.5 F (36.4 C)   Ht 5' (1.524 m)   Wt 215 lb (97.5 kg)   BMI 41.99 kg/m   Body mass index is 41.99 kg/m.      Objective:   Physical Exam Vitals signs reviewed.  Constitutional:      Appearance: She is well-developed.  HENT:     Head: Normocephalic and atraumatic.  Eyes:     Conjunctiva/sclera: Conjunctivae normal.     Pupils: Pupils are equal, round, and reactive to light.  Neck:     Musculoskeletal: Normal range of motion and neck supple.  Cardiovascular:     Rate and Rhythm: Normal rate and regular rhythm.  Pulmonary:     Effort: Pulmonary effort is normal.  Abdominal:     Palpations: Abdomen is soft.  Musculoskeletal:     Right knee: She exhibits decreased range of motion, swelling and effusion. Tenderness found. Medial joint line tenderness noted.       Legs:  Skin:    General: Skin is warm and dry.  Neurological:     Mental Status: She is alert and oriented to person, place, and time.     Cranial Nerves: No cranial nerve deficit.     Motor: No abnormal muscle tone.     Coordination: Coordination normal.     Deep Tendon Reflexes: Reflexes are normal and symmetric. Reflexes normal.  Psychiatric:        Behavior: Behavior  normal.        Thought Content: Thought content normal.        Judgment: Judgment normal.    I have reviewed the x-rays of her knees, her lumbar and thoracic spine done 05-11-2019.       Assessment & Plan:   Encounter Diagnoses  Name Primary?  . Chronic pain of right knee Yes  . Body mass index 40.0-44.9, adult ([place])   . Morbid obesity ([place])    She declines injection.  She declines medication by mouth.  I will see her in two weeks.  She will ponder what we discussed.  Call if any problem.  Precautions discussed.   Electronically Signed Sanjuana Kava, MD 6/18/202012:04 PM

## 2019-06-03 ENCOUNTER — Other Ambulatory Visit: Payer: Self-pay

## 2019-06-03 ENCOUNTER — Ambulatory Visit (INDEPENDENT_AMBULATORY_CARE_PROVIDER_SITE_OTHER): Payer: PPO | Admitting: Orthopaedic Surgery

## 2019-06-03 ENCOUNTER — Encounter: Payer: Self-pay | Admitting: Orthopaedic Surgery

## 2019-06-03 VITALS — BP 126/77 | HR 77 | Temp 98.0°F | Ht 60.0 in | Wt 211.0 lb

## 2019-06-03 DIAGNOSIS — Z6841 Body Mass Index (BMI) 40.0 and over, adult: Secondary | ICD-10-CM

## 2019-06-03 DIAGNOSIS — G8929 Other chronic pain: Secondary | ICD-10-CM | POA: Diagnosis not present

## 2019-06-03 DIAGNOSIS — M25561 Pain in right knee: Secondary | ICD-10-CM

## 2019-06-03 MED ORDER — PREDNISONE 5 MG (21) PO TBPK: ORAL_TABLET | ORAL | 0 refills | Status: DC

## 2019-06-03 NOTE — Progress Notes (Signed)
Patient [name], female DOB:[birth year], 68 y.o. WNU:[id number]  Chief Complaint  Patient presents with  . Knee Pain    right/ feels better using biofreeze     HPI  Maria Fry is a 68 y.o. female who has continued pain of the right knee.  I had sent in prednisone dose pack to her pharmacy but she said they did not get it. She did not call the office.  I will do this again today and she is to call if they did not get the order.  She still has swelling and popping of the right knee, no new trauma, no redness, no giving way.  She may need MRI.   Body mass index is 41.21 kg/m.  The patient meets the AMA guidelines for Morbid (severe) obesity with a BMI > 40.0 and I have recommended weight loss.   ROS  Review of Systems  Constitutional: Positive for activity change.  Respiratory: Positive for shortness of breath and wheezing. Negative for cough.   Cardiovascular: Positive for chest pain, palpitations and leg swelling.  Endocrine: Positive for cold intolerance and heat intolerance.  Musculoskeletal: Positive for arthralgias, back pain, gait problem, joint swelling and myalgias.  Allergic/Immunologic: Positive for environmental allergies and immunocompromised state.  Neurological: Negative for dizziness and headaches.  Hematological: Bruises/bleeds easily.  Psychiatric/Behavioral: The patient is nervous/anxious.   All other systems reviewed and are negative.   All other systems reviewed and are negative.  The following is a summary of the past history medically, past history surgically, known current medicines, social history and family history.  This information is gathered electronically by the computer from prior information and documentation.  I review this each visit and have found including this information at this point in the chart is beneficial and informative.    Past Medical History:  Diagnosis Date  . Acute on chronic combined systolic (congestive) and diastolic  (congestive) heart failure ([place]) 04/23/2016.   EF 45% and grade 2 diastolic dysfunction.  Marland Kitchen CAD (coronary artery disease)    a. cath 06/07/15 Dist LAD 100%, mid LAD 10%.  . Essential hypertension   . Hip pain   . Hyperlipidemia   . Hypothyroidism   . MI (myocardial infarction) ([place]) 06/05/2015  . Vitamin D deficiency     Past Surgical History:  Procedure Laterality Date  . ABDOMINAL HYSTERECTOMY    . CARDIAC CATHETERIZATION N/A 06/06/2015   Procedure: Left Heart Cath and Coronary Angiography;  Surgeon: Peter M Martinique, MD;  Location: [facility];  Service: Cardiovascular;  Laterality: N/A;  . CHOLECYSTECTOMY    . COLONOSCOPY    . COLONOSCOPY N/A 08/13/2017   Procedure: COLONOSCOPY;  Surgeon: Daneil Dolin, MD;  [facility];  Service: Endoscopy;  Laterality: N/A;  8:30 AM  . ESOPHAGOGASTRODUODENOSCOPY N/A 09/11/2016   Procedure: ESOPHAGOGASTRODUODENOSCOPY (EGD);  Surgeon: Daneil Dolin, MD;  [facility];  Service: Endoscopy;  Laterality: N/A;  7:45 am - moved to 10/11 @ 10:30  . Heel spurs    . MALONEY DILATION N/A 09/11/2016   Procedure: Venia Minks DILATION;  Surgeon: Daneil Dolin, MD;  [facility];  Service: Endoscopy;  Laterality: N/A;  . POLYPECTOMY  08/13/2017   Procedure: POLYPECTOMY;  Surgeon: Daneil Dolin, MD;  [facility];  Service: Endoscopy;;  colon  . SHOULDER ARTHROSCOPY    . TUBAL LIGATION      Family History  Problem Relation Age of Onset  . Heart  failure Father        Deceased  . Heart attack Father        Deceased  . Stroke Sister        Deceased  . Heart failure Brother        Deceased  . Cancer Brother        Deceased  . Heart attack Brother        Deceased  . Colon cancer Neg Hx     Social History Social History   Tobacco Use  . Smoking status: Former Smoker    Types: Cigarettes    Start date: 12/02/1977    Quit date: 12/02/2002    Years since quitting: 16.5  . Smokeless tobacco: Never Used   Substance Use Topics  . Alcohol use: No    Alcohol/week: 0.0 standard drinks  . Drug use: No    Allergies  Allergen Reactions  . Vicodin [Hydrocodone-Acetaminophen] Other (See Comments)    Severe nausea and diarrhea   . Amoxicillin     edema  . Demerol [Meperidine] Nausea And Vomiting  . Lisinopril Other (See Comments)    Throat swollen  . Lodine [Etodolac] Rash    Current Outpatient Medications  Medication Sig Dispense Refill  . acetaminophen (TYLENOL) 500 MG tablet Take 500 mg by mouth every 6 (six) hours as needed for mild pain or moderate pain. Reported on 04/22/2016    . aspirin 81 MG chewable tablet Chew 1 tablet (81 mg total) by mouth daily.    Marland Kitchen atorvastatin (LIPITOR) 80 MG tablet Take 1 tablet (80 mg total) by mouth at bedtime. 90 tablet 3  . gabapentin (NEURONTIN) 300 MG capsule Take 300 mg by mouth at bedtime.     Marland Kitchen levothyroxine (SYNTHROID) 100 MCG tablet TAKE 1 TABLET BY MOUTH ONCE DAILY BEFORE BREAKFAST 90 tablet 0  . loratadine (CLARITIN) 10 MG tablet Take 10 mg by mouth daily as needed for allergies.    . metoprolol tartrate (LOPRESSOR) 25 MG tablet TAKE 25 MG BY MOUTH TWICE DAILY 180 tablet 3  . nitroGLYCERIN (NITROSTAT) 0.4 MG SL tablet DISSOLVE ONE TABLET UNDER THE TONGUE EVERY 5 MINUTES AS NEEDED FOR CHEST PAIN.  DO NOT EXCEED A TOTAL OF 3 DOSES IN 15 MINUTES 25 tablet 3  . potassium chloride SA (KLOR-CON M15) 15 MEQ tablet Take 1 tablet (15 mEq total) by mouth 2 (two) times daily. 180 tablet 3  . RANEXA 1000 MG SR tablet Take 1 tablet by mouth twice daily 180 tablet 0  . furosemide (LASIX) 20 MG tablet Take 1 tablet (20 mg total) by mouth daily. 90 tablet 3  . predniSONE (STERAPRED UNI-PAK 21 TAB) 5 MG (21) TBPK tablet Take 6 pills first day; 5 pills second day; 4 pills third day; 3 pills fourth day; 2 pills next day and 1 pill last day. 21 tablet 0   No current facility-administered medications for this visit.      Physical Exam  Blood pressure 126/77,  pulse 77, height 5' (1.524 m), weight 211 lb (95.7 kg).  Constitutional: overall normal hygiene, normal nutrition, well developed, normal grooming, normal body habitus. Assistive device:none  Musculoskeletal: gait and station Limp right, muscle tone and strength are normal, no tremors or atrophy is present.  .  Neurological: coordination overall normal.  Deep tendon reflex/nerve stretch intact.  Sensation normal.  Cranial nerves II-XII intact.   Skin:   Normal overall no scars, lesions, ulcers or rashes. No psoriasis.  Psychiatric: Alert and oriented  x 3.  Recent memory intact, remote memory unclear.  Normal mood and affect. Well groomed.  Good eye contact.  Cardiovascular: overall no swelling, no varicosities, no edema bilaterally, normal temperatures of the legs and arms, no clubbing, cyanosis and good capillary refill.  Lymphatic: palpation is normal.  Right knee with effusion, crepitus, ROM 0 to 105, limp right, NV intact.  All other systems reviewed and are negative   The patient has been educated about the nature of the problem(s) and counseled on treatment options.  The patient appeared to understand what I have discussed and is in agreement with it.  Encounter Diagnoses  Name Primary?  . Chronic pain of right knee Yes  . Morbid obesity ([place])   . Body mass index 40.0-44.9, adult [facility])     PLAN Call if any problems.  Precautions discussed.  Continue current medications.   Return to clinic 2 weeks   Electronically Signed Sanjuana Kava, MD 7/2/202010:27 AM

## 2019-06-17 ENCOUNTER — Ambulatory Visit (INDEPENDENT_AMBULATORY_CARE_PROVIDER_SITE_OTHER): Payer: PPO | Admitting: Orthopaedic Surgery

## 2019-06-17 ENCOUNTER — Encounter: Payer: Self-pay | Admitting: Orthopaedic Surgery

## 2019-06-17 ENCOUNTER — Other Ambulatory Visit: Payer: Self-pay

## 2019-06-17 VITALS — BP 120/86 | HR 66 | Ht 60.0 in | Wt 214.0 lb

## 2019-06-17 DIAGNOSIS — Z6841 Body Mass Index (BMI) 40.0 and over, adult: Secondary | ICD-10-CM | POA: Diagnosis not present

## 2019-06-17 DIAGNOSIS — G8929 Other chronic pain: Secondary | ICD-10-CM | POA: Diagnosis not present

## 2019-06-17 DIAGNOSIS — M25561 Pain in right knee: Secondary | ICD-10-CM | POA: Diagnosis not present

## 2019-06-17 NOTE — Progress Notes (Signed)
Patient ZO:XWRU Maria Fry, female DOB:January 04, 1951, 68 y.o. EAV:[id number]  Chief Complaint  Patient presents with  . Knee Pain    R/ swollen some/not hurting right now    HPI  Maria Fry is a 68 y.o. female who has right knee pain.  She got the prednisone and it helped.  She has less pain.  She has swelling and popping but no giving way.  She feels better. She still has pain after a long day.   Body mass index is 41.79 kg/m.  The patient meets the AMA guidelines for Morbid (severe) obesity with a BMI > 40.0 and I have recommended weight loss.   ROS  Review of Systems  Constitutional: Positive for activity change.  Respiratory: Positive for shortness of breath and wheezing. Negative for cough.   Cardiovascular: Positive for chest pain, palpitations and leg swelling.  Endocrine: Positive for cold intolerance and heat intolerance.  Musculoskeletal: Positive for arthralgias, back pain, gait problem, joint swelling and myalgias.  Allergic/Immunologic: Positive for environmental allergies and immunocompromised state.  Neurological: Negative for dizziness and headaches.  Hematological: Bruises/bleeds easily.  Psychiatric/Behavioral: The patient is nervous/anxious.   All other systems reviewed and are negative.   All other systems reviewed and are negative.  The following is a summary of the past history medically, past history surgically, known current medicines, social history and family history.  This information is gathered electronically by the computer from prior information and documentation.  I review this each visit and have found including this information at this point in the chart is beneficial and informative.    Past Medical History:  Diagnosis Date  . Acute on chronic combined systolic (congestive) and diastolic (congestive) heart failure ([place]) 04/23/2016.   EF 45% and grade 2 diastolic dysfunction.  Marland Kitchen CAD (coronary artery disease)    a. cath 06/07/15 Dist LAD 100%, mid LAD  10%.  . Essential hypertension   . Hip pain   . Hyperlipidemia   . Hypothyroidism   . MI (myocardial infarction) ([place]) 06/05/2015  . Vitamin D deficiency     Past Surgical History:  Procedure Laterality Date  . ABDOMINAL HYSTERECTOMY    . CARDIAC CATHETERIZATION N/A 06/06/2015   Procedure: Left Heart Cath and Coronary Angiography;  Surgeon: Peter M Martinique, MD;  Location: [facility];  Service: Cardiovascular;  Laterality: N/A;  . CHOLECYSTECTOMY    . COLONOSCOPY    . COLONOSCOPY N/A 08/13/2017   Procedure: COLONOSCOPY;  Surgeon: Daneil Dolin, MD;  [facility];  Service: Endoscopy;  Laterality: N/A;  8:30 AM  . ESOPHAGOGASTRODUODENOSCOPY N/A 09/11/2016   Procedure: ESOPHAGOGASTRODUODENOSCOPY (EGD);  Surgeon: Daneil Dolin, MD;  [facility];  Service: Endoscopy;  Laterality: N/A;  7:45 am - moved to 10/11 @ 10:30  . Heel spurs    . MALONEY DILATION N/A 09/11/2016   Procedure: Venia Minks DILATION;  Surgeon: Daneil Dolin, MD;  [facility];  Service: Endoscopy;  Laterality: N/A;  . POLYPECTOMY  08/13/2017   Procedure: POLYPECTOMY;  Surgeon: Daneil Dolin, MD;  [facility];  Service: Endoscopy;;  colon  . SHOULDER ARTHROSCOPY    . TUBAL LIGATION      Family History  Problem Relation Age of Onset  . Heart failure Father        Deceased  . Heart attack Father        Deceased  . Stroke Sister        Deceased  .  Heart failure Brother        Deceased  . Cancer Brother        Deceased  . Heart attack Brother        Deceased  . Colon cancer Neg Hx     Social History Social History   Tobacco Use  . Smoking status: Former Smoker    Types: Cigarettes    Start date: 12/02/1977    Quit date: 12/02/2002    Years since quitting: 16.5  . Smokeless tobacco: Never Used  Substance Use Topics  . Alcohol use: No    Alcohol/week: 0.0 standard drinks  . Drug use: No    Allergies  Allergen Reactions  . Vicodin  [Hydrocodone-Acetaminophen] Other (See Comments)    Severe nausea and diarrhea   . Amoxicillin     edema  . Demerol [Meperidine] Nausea And Vomiting  . Lisinopril Other (See Comments)    Throat swollen  . Lodine [Etodolac] Rash    Current Outpatient Medications  Medication Sig Dispense Refill  . acetaminophen (TYLENOL) 500 MG tablet Take 500 mg by mouth every 6 (six) hours as needed for mild pain or moderate pain. Reported on 04/22/2016    . aspirin 81 MG chewable tablet Chew 1 tablet (81 mg total) by mouth daily.    Marland Kitchen atorvastatin (LIPITOR) 80 MG tablet Take 1 tablet (80 mg total) by mouth at bedtime. 90 tablet 3  . furosemide (LASIX) 20 MG tablet Take 1 tablet (20 mg total) by mouth daily. 90 tablet 3  . gabapentin (NEURONTIN) 300 MG capsule Take 300 mg by mouth at bedtime.     Marland Kitchen levothyroxine (SYNTHROID) 100 MCG tablet TAKE 1 TABLET BY MOUTH ONCE DAILY BEFORE BREAKFAST 90 tablet 0  . loratadine (CLARITIN) 10 MG tablet Take 10 mg by mouth daily as needed for allergies.    . metoprolol tartrate (LOPRESSOR) 25 MG tablet TAKE 25 MG BY MOUTH TWICE DAILY 180 tablet 3  . nitroGLYCERIN (NITROSTAT) 0.4 MG SL tablet DISSOLVE ONE TABLET UNDER THE TONGUE EVERY 5 MINUTES AS NEEDED FOR CHEST PAIN.  DO NOT EXCEED A TOTAL OF 3 DOSES IN 15 MINUTES 25 tablet 3  . potassium chloride SA (KLOR-CON M15) 15 MEQ tablet Take 1 tablet (15 mEq total) by mouth 2 (two) times daily. 180 tablet 3  . predniSONE (STERAPRED UNI-PAK 21 TAB) 5 MG (21) TBPK tablet Take 6 pills first day; 5 pills second day; 4 pills third day; 3 pills fourth day; 2 pills next day and 1 pill last day. 21 tablet 0  . RANEXA 1000 MG SR tablet Take 1 tablet by mouth twice daily 180 tablet 0   No current facility-administered medications for this visit.      Physical Exam  Blood pressure 120/86, pulse 66, height 5' (1.524 m), weight 214 lb (97.1 kg).  Constitutional: overall normal hygiene, normal nutrition, well developed, normal grooming,  normal body habitus. Assistive device:none  Musculoskeletal: gait and station Limp right knee, muscle tone and strength are normal, no tremors or atrophy is present.  .  Neurological: coordination overall normal.  Deep tendon reflex/nerve stretch intact.  Sensation normal.  Cranial nerves II-XII intact.   Skin:   Normal overall no scars, lesions, ulcers or rashes. No psoriasis.  Psychiatric: Alert and oriented x 3.  Recent memory intact, remote memory unclear.  Normal mood and affect. Well groomed.  Good eye contact.  Cardiovascular: overall no swelling, no varicosities, no edema bilaterally, normal temperatures of the legs and  arms, no clubbing, cyanosis and good capillary refill.  Lymphatic: palpation is normal.  Right knee pain, ROM 0 to 105, crepitus, slight effusion, knee is stable, limp to the right, NV intact.  All other systems reviewed and are negative   The patient has been educated about the nature of the problem(s) and counseled on treatment options.  The patient appeared to understand what I have discussed and is in agreement with it.  Encounter Diagnoses  Name Primary?  . Chronic pain of right knee Yes  . Morbid obesity ([place])   . Body mass index 40.0-44.9, adult [facility])     PLAN Call if any problems.  Precautions discussed.  Continue current medications.   Return to clinic 1 month   Electronically Signed Sanjuana Kava, MD 7/16/202011:18 AM

## 2019-06-28 ENCOUNTER — Other Ambulatory Visit: Payer: Self-pay

## 2019-06-29 ENCOUNTER — Ambulatory Visit (INDEPENDENT_AMBULATORY_CARE_PROVIDER_SITE_OTHER): Payer: PPO | Admitting: Family Medicine

## 2019-06-29 ENCOUNTER — Encounter: Payer: Self-pay | Admitting: Family Medicine

## 2019-06-29 VITALS — BP 132/76 | HR 98 | Temp 98.1°F | Resp 14 | Ht 60.0 in | Wt 213.0 lb

## 2019-06-29 DIAGNOSIS — R319 Hematuria, unspecified: Secondary | ICD-10-CM

## 2019-06-29 DIAGNOSIS — N390 Urinary tract infection, site not specified: Secondary | ICD-10-CM | POA: Diagnosis not present

## 2019-06-29 LAB — URINALYSIS, ROUTINE W REFLEX MICROSCOPIC
Bilirubin Urine: NEGATIVE
Glucose, UA: NEGATIVE
Ketones, ur: NEGATIVE
Nitrite: NEGATIVE
Specific Gravity, Urine: 1.02 (ref 1.001–1.03)
pH: 7.5 (ref 5.0–8.0)

## 2019-06-29 LAB — MICROSCOPIC MESSAGE

## 2019-06-29 MED ORDER — PHENAZOPYRIDINE HCL 100 MG PO TABS: 100.0000 mg | ORAL_TABLET | Freq: Three times a day (TID) | ORAL | 0 refills | Status: DC | PRN

## 2019-06-29 MED ORDER — CIPROFLOXACIN HCL 500 MG PO TABS: 500.0000 mg | ORAL_TABLET | Freq: Two times a day (BID) | ORAL | 0 refills | Status: DC

## 2019-06-29 NOTE — Progress Notes (Signed)
   Subjective:    Patient ID: Maria Fry, female    DOB: 03/28/1951, 68 y.o.   MRN: [id number]  Patient presents for Dysuria (x1week- heamturia with blood clots, pain withurination)    Left flank pain and dysuria, has noticed blood with clots for past week. Pianful urination, only gets small amount at time. No fever, no vomiting.   No vaginal bleeding   No change in bowels   Only takes asa 81mg  ONCE A DAY      Review Of Systems:  GEN- denies fatigue, fever, weight loss,weakness, recent illness HEENT- denies eye drainage, change in vision, nasal discharge, CVS- denies chest pain, palpitations RESP- denies SOB, cough, wheeze ABD- denies N/V, change in stools, abd pain GU-+dysuria, +hematuria, dribbling, incontinence MSK- denies joint pain, muscle aches, injury Neuro- denies headache, dizziness, syncope, seizure activity       Objective:    BP 132/76   Pulse 98   Temp 98.1 F (36.7 C) (Oral)   Resp 14   Ht 5' (1.524 m)   Wt 213 lb (96.6 kg)   SpO2 98%   BMI 41.60 kg/m  GEN- NAD, alert and oriented x3 HEENT- PERRL, EOMI, non injected sclera, pink conjunctiva, MMM, oropharynx clear CVS- RRR, no murmur RESP-CTAB ABD-NABS,soft, nondistended mild tenderness to palpation in the left flank but no rebound no guarding nontender over suprapubic region EXT- No edema Pulses- Radial2+        Assessment & Plan:      Problem List Items Addressed This Visit    None    Visit Diagnoses    Urinary tract infection with hematuria, site unspecified    -  Primary   Treat for UTI start Cipro as she did have swelling with amoxicillin.  She also may have some upper tract infection with some mild flank pain.  She is not improved by her recheck on this Friday we will look for kidney stone contributing.  Was also given Pyridium   Relevant Medications   phenazopyridine (PYRIDIUM) 100 MG tablet   Other Relevant Orders   Urinalysis, Routine w reflex microscopic (Completed)   Urine  Culture   Hematuria, unspecified type       Relevant Orders   Urinalysis, Routine w reflex microscopic (Completed)   Urine Culture      Note: This dictation was prepared with Dragon dictation along with smaller phrase technology. Any transcriptional errors that result from this process are unintentional.

## 2019-06-29 NOTE — Patient Instructions (Signed)
F/u fRIDAY as scheduled Take 1 tablet twice a day  Pyridium will turn urine orange

## 2019-07-01 ENCOUNTER — Other Ambulatory Visit: Payer: Self-pay

## 2019-07-01 LAB — URINE CULTURE
MICRO NUMBER:: 711287
SPECIMEN QUALITY:: ADEQUATE

## 2019-07-02 ENCOUNTER — Encounter: Payer: Self-pay | Admitting: Family Medicine

## 2019-07-02 ENCOUNTER — Ambulatory Visit (INDEPENDENT_AMBULATORY_CARE_PROVIDER_SITE_OTHER): Payer: PPO | Admitting: Family Medicine

## 2019-07-02 VITALS — BP 138/84 | HR 64 | Temp 98.1°F | Resp 14 | Ht 60.0 in | Wt 214.0 lb

## 2019-07-02 DIAGNOSIS — E669 Obesity, unspecified: Secondary | ICD-10-CM | POA: Diagnosis not present

## 2019-07-02 DIAGNOSIS — R609 Edema, unspecified: Secondary | ICD-10-CM | POA: Diagnosis not present

## 2019-07-02 DIAGNOSIS — I10 Essential (primary) hypertension: Secondary | ICD-10-CM | POA: Diagnosis not present

## 2019-07-02 DIAGNOSIS — M5136 Other intervertebral disc degeneration, lumbar region: Secondary | ICD-10-CM | POA: Diagnosis not present

## 2019-07-02 DIAGNOSIS — I5042 Chronic combined systolic (congestive) and diastolic (congestive) heart failure: Secondary | ICD-10-CM

## 2019-07-02 MED ORDER — TRAMADOL HCL 50 MG PO TABS: 50.0000 mg | ORAL_TABLET | Freq: Three times a day (TID) | ORAL | 0 refills | Status: AC | PRN

## 2019-07-02 NOTE — Assessment & Plan Note (Signed)
Continue watching her carbohydrates as well as sodium intake in the setting of her heart disease.   For her urinary tract infection her symptoms have already improved the hematuria grossly has resolved.  Complete the course of antibiotics

## 2019-07-02 NOTE — Patient Instructions (Signed)
Finish antibiotics Use tramadol or tylenol for the back pain  F/U 6 months for Physical

## 2019-07-02 NOTE — Progress Notes (Signed)
   Subjective:    Patient ID: Maria Fry, female    DOB: November 02, 1951, 68 y.o.   MRN: [id number]  Patient presents for Follow-up (is not fasting) and Urinary Tract Infection (reports that she is feeling better- has not noticed any further blood clots)   Hematuria has resolved that she can tell, she is not going as frequency now. She is taking the antibiotics. Left side did improve, but was bending over to put sock yesterday and felt a a severe pain in center of back that raidated around, and now has soreness in right mid/lower back .  She used topical muscle rub and has already had improvement in her symptoms.  HTN- taking BP at home once a week, typically 130's/ 60-70's   CHF- swelling much improved , taking lasix 40mg  once a day    Hyperlipidemia- taking lipitor , LDL at goal in Jan, A1C normal    Hypothyroidism- taking levothyroxine, needs refill , last TSH in Jan 2020 - followed by endocrinology      Review Of Systems:  GEN- denies fatigue, fever, weight loss,weakness, recent illness HEENT- denies eye drainage, change in vision, nasal discharge, CVS- denies chest pain, palpitations RESP- denies SOB, cough, wheeze ABD- denies N/V, change in stools, abd pain GU- denies dysuria, hematuria, dribbling, incontinence MSK- denies joint pain, muscle aches, injury Neuro- denies headache, dizziness, syncope, seizure activity       Objective:    BP 138/84   Pulse 64   Temp 98.1 F (36.7 C) (Oral)   Resp 14   Ht 5' (1.524 m)   Wt 214 lb (97.1 kg)   SpO2 96%   BMI 41.79 kg/m  GEN- NAD, alert and oriented x3 HEENT- PERRL, EOMI, non injected sclera, pink conjunctiva, MMM, oropharynx clear Neck- Supple, no thyromegaly CVS- RRR, no murmur RESP-CTAB ABD-NABS,soft,NT,ND MSK- Mild TTP lumbar spine and right mid/lower paraspinals, fair ROM spine/hips, neg SLR, non antalgic gait  EXT- No edema Pulses- Radial, DP- 2+        Assessment & Plan:      Problem List Items Addressed  This Visit      Unprioritized   Chronic combined systolic and diastolic heart failure (HCC)   DDD (degenerative disc disease), lumbar    Think that she tweaked her back this weekend.  There are no red flags.  She has known degenerative disc disease.  She has tramadol at home can also use Tylenol or her topical muscle rub which did help.      Relevant Medications   traMADol (ULTRAM) 50 MG tablet   Hypertension - Primary    Blood pressure overall looks okay.  No change in her medications.  She has had recent labs done which were reviewed again at the bedside.  Renal function is tolerating the higher dose of Lasix at 40 mg for her edema and history of combined heart failure.      Obesity (BMI 30-39.9)    Continue watching her carbohydrates as well as sodium intake in the setting of her heart disease.   For her urinary tract infection her symptoms have already improved the hematuria grossly has resolved.  Complete the course of antibiotics      Peripheral edema      Note: This dictation was prepared with Dragon dictation along with smaller phrase technology. Any transcriptional errors that result from this process are unintentional.

## 2019-07-02 NOTE — Assessment & Plan Note (Signed)
Blood pressure overall looks okay.  No change in her medications.  She has had recent labs done which were reviewed again at the bedside.  Renal function is tolerating the higher dose of Lasix at 40 mg for her edema and history of combined heart failure.

## 2019-07-02 NOTE — Assessment & Plan Note (Signed)
Think that she tweaked her back this weekend.  There are no red flags.  She has known degenerative disc disease.  She has tramadol at home can also use Tylenol or her topical muscle rub which did help.

## 2019-07-05 ENCOUNTER — Other Ambulatory Visit: Payer: Self-pay | Admitting: "Endocrinology

## 2019-07-20 ENCOUNTER — Encounter: Payer: Self-pay | Admitting: Orthopaedic Surgery

## 2019-07-20 ENCOUNTER — Ambulatory Visit (INDEPENDENT_AMBULATORY_CARE_PROVIDER_SITE_OTHER): Payer: PPO | Admitting: Orthopaedic Surgery

## 2019-07-20 ENCOUNTER — Other Ambulatory Visit: Payer: Self-pay

## 2019-07-20 VITALS — BP 134/73 | HR 64 | Temp 96.4°F | Ht 60.0 in | Wt 213.0 lb

## 2019-07-20 DIAGNOSIS — M25561 Pain in right knee: Secondary | ICD-10-CM

## 2019-07-20 DIAGNOSIS — Z6841 Body Mass Index (BMI) 40.0 and over, adult: Secondary | ICD-10-CM | POA: Diagnosis not present

## 2019-07-20 DIAGNOSIS — G8929 Other chronic pain: Secondary | ICD-10-CM | POA: Diagnosis not present

## 2019-07-20 NOTE — Progress Notes (Signed)
Patient Maria Fry, female DOB:Nov 22, 1951, 68 y.o. RDE:[id number]  Chief Complaint  Patient presents with  . Knee Pain    Right knee pain.    HPI  Maria Fry is a 68 y.o. female who has right knee pain. She has swelling and popping. She is better with the Voltaren Gel.  She has no new trauma, no giving way.   Body mass index is 41.6 kg/m.  The patient meets the AMA guidelines for Morbid (severe) obesity with a BMI > 40.0 and I have recommended weight loss.   ROS  Review of Systems  Constitutional: Positive for activity change.  Respiratory: Positive for shortness of breath and wheezing. Negative for cough.   Cardiovascular: Positive for chest pain, palpitations and leg swelling.  Endocrine: Positive for cold intolerance and heat intolerance.  Musculoskeletal: Positive for arthralgias, back pain, gait problem, joint swelling and myalgias.  Allergic/Immunologic: Positive for environmental allergies and immunocompromised state.  Neurological: Negative for dizziness and headaches.  Hematological: Bruises/bleeds easily.  Psychiatric/Behavioral: The patient is nervous/anxious.   All other systems reviewed and are negative.   All other systems reviewed and are negative.  The following is a summary of the past history medically, past history surgically, known current medicines, social history and family history.  This information is gathered electronically by the computer from prior information and documentation.  I review this each visit and have found including this information at this point in the chart is beneficial and informative.    Past Medical History:  Diagnosis Date  . Acute on chronic combined systolic (congestive) and diastolic (congestive) heart failure ([place]) 04/23/2016.   EF 45% and grade 2 diastolic dysfunction.  Marland Kitchen CAD (coronary artery disease)    a. cath 06/07/15 Dist LAD 100%, mid LAD 10%.  . Essential hypertension   . Hip pain   . Hyperlipidemia   .  Hypothyroidism   . MI (myocardial infarction) ([place]) 06/05/2015  . Vitamin D deficiency     Past Surgical History:  Procedure Laterality Date  . ABDOMINAL HYSTERECTOMY    . CARDIAC CATHETERIZATION N/A 06/06/2015   Procedure: Left Heart Cath and Coronary Angiography;  Surgeon: Peter M Martinique, MD;  Location: [facility];  Service: Cardiovascular;  Laterality: N/A;  . CHOLECYSTECTOMY    . COLONOSCOPY    . COLONOSCOPY N/A 08/13/2017   Procedure: COLONOSCOPY;  Surgeon: Daneil Dolin, MD;  [facility];  Service: Endoscopy;  Laterality: N/A;  8:30 AM  . ESOPHAGOGASTRODUODENOSCOPY N/A 09/11/2016   Procedure: ESOPHAGOGASTRODUODENOSCOPY (EGD);  Surgeon: Daneil Dolin, MD;  [facility];  Service: Endoscopy;  Laterality: N/A;  7:45 am - moved to 10/11 @ 10:30  . Heel spurs    . MALONEY DILATION N/A 09/11/2016   Procedure: Venia Minks DILATION;  Surgeon: Daneil Dolin, MD;  [facility];  Service: Endoscopy;  Laterality: N/A;  . POLYPECTOMY  08/13/2017   Procedure: POLYPECTOMY;  Surgeon: Daneil Dolin, MD;  [facility];  Service: Endoscopy;;  colon  . SHOULDER ARTHROSCOPY    . TUBAL LIGATION      Family History  Problem Relation Age of Onset  . Heart failure Father        Deceased  . Heart attack Father        Deceased  . Stroke Sister        Deceased  . Heart failure Brother        Deceased  . Cancer Brother  Deceased  . Heart attack Brother        Deceased  . Colon cancer Neg Hx     Social History Social History   Tobacco Use  . Smoking status: Former Smoker    Types: Cigarettes    Start date: 12/02/1977    Quit date: 12/02/2002    Years since quitting: 16.6  . Smokeless tobacco: Never Used  Substance Use Topics  . Alcohol use: No    Alcohol/week: 0.0 standard drinks  . Drug use: No    Allergies  Allergen Reactions  . Vicodin [Hydrocodone-Acetaminophen] Other (See Comments)    Severe nausea and diarrhea   .  Amoxicillin     edema  . Demerol [Meperidine] Nausea And Vomiting  . Lisinopril Other (See Comments)    Throat swollen  . Lodine [Etodolac] Rash    Current Outpatient Medications  Medication Sig Dispense Refill  . acetaminophen (TYLENOL) 500 MG tablet Take 500 mg by mouth every 6 (six) hours as needed for mild pain or moderate pain. Reported on 04/22/2016    . aspirin 81 MG chewable tablet Chew 1 tablet (81 mg total) by mouth daily.    Marland Kitchen atorvastatin (LIPITOR) 80 MG tablet Take 1 tablet (80 mg total) by mouth at bedtime. 90 tablet 3  . ciprofloxacin (CIPRO) 500 MG tablet Take 1 tablet (500 mg total) by mouth 2 (two) times daily. 14 tablet 0  . furosemide (LASIX) 20 MG tablet Take 1 tablet (20 mg total) by mouth daily. 90 tablet 3  . gabapentin (NEURONTIN) 300 MG capsule Take 300 mg by mouth at bedtime.     Marland Kitchen levothyroxine (SYNTHROID) 100 MCG tablet TAKE 1 TABLET BY MOUTH ONCE DAILY BEFORE BREAKFAST 90 tablet 0  . loratadine (CLARITIN) 10 MG tablet Take 10 mg by mouth daily as needed for allergies.    . metoprolol tartrate (LOPRESSOR) 25 MG tablet TAKE 25 MG BY MOUTH TWICE DAILY 180 tablet 3  . nitroGLYCERIN (NITROSTAT) 0.4 MG SL tablet DISSOLVE ONE TABLET UNDER THE TONGUE EVERY 5 MINUTES AS NEEDED FOR CHEST PAIN.  DO NOT EXCEED A TOTAL OF 3 DOSES IN 15 MINUTES 25 tablet 3  . phenazopyridine (PYRIDIUM) 100 MG tablet Take 1 tablet (100 mg total) by mouth 3 (three) times daily as needed for pain. 10 tablet 0  . potassium chloride SA (KLOR-CON M15) 15 MEQ tablet Take 1 tablet (15 mEq total) by mouth 2 (two) times daily. 180 tablet 3  . RANEXA 1000 MG SR tablet Take 1 tablet by mouth twice daily 180 tablet 0   No current facility-administered medications for this visit.      Physical Exam  Blood pressure 134/73, pulse 64, temperature (!) 96.4 F (35.8 C), height 5' (1.524 m), weight 213 lb (96.6 kg).  Constitutional: overall normal hygiene, normal nutrition, well developed, normal  grooming, normal body habitus. Assistive device:none  Musculoskeletal: gait and station Limp none, muscle tone and strength are normal, no tremors or atrophy is present.  .  Neurological: coordination overall normal.  Deep tendon reflex/nerve stretch intact.  Sensation normal.  Cranial nerves II-XII intact.   Skin:   Normal overall no scars, lesions, ulcers or rashes. No psoriasis.  Psychiatric: Alert and oriented x 3.  Recent memory intact, remote memory unclear.  Normal mood and affect. Well groomed.  Good eye contact.  Cardiovascular: overall no swelling, no varicosities, no edema bilaterally, normal temperatures of the legs and arms, no clubbing, cyanosis and good capillary refill.  Lymphatic:  palpation is normal.  Her right knee has slight effusion, crepitus, ROM 0 to 115, no limp, NV intact. All other systems reviewed and are negative   The patient has been educated about the nature of the problem(s) and counseled on treatment options.  The patient appeared to understand what I have discussed and is in agreement with it.  Encounter Diagnoses  Name Primary?  . Chronic pain of right knee Yes  . Morbid obesity ([place])   . Body mass index 40.0-44.9, adult [facility])     PLAN Call if any problems.  Precautions discussed.  Continue current medications.   Return to clinic as needed per her request.   Electronically Signed Sanjuana Kava, MD 8/18/202010:24 AM

## 2019-08-02 ENCOUNTER — Other Ambulatory Visit: Payer: Self-pay

## 2019-08-02 ENCOUNTER — Ambulatory Visit: Payer: PPO | Admitting: Cardiovascular Disease

## 2019-08-02 ENCOUNTER — Encounter: Payer: Self-pay | Admitting: Cardiovascular Disease

## 2019-08-02 VITALS — BP 152/89 | HR 71 | Temp 97.1°F | Ht 60.0 in | Wt 205.0 lb

## 2019-08-02 DIAGNOSIS — I38 Endocarditis, valve unspecified: Secondary | ICD-10-CM

## 2019-08-02 DIAGNOSIS — I5042 Chronic combined systolic (congestive) and diastolic (congestive) heart failure: Secondary | ICD-10-CM | POA: Diagnosis not present

## 2019-08-02 DIAGNOSIS — E785 Hyperlipidemia, unspecified: Secondary | ICD-10-CM | POA: Diagnosis not present

## 2019-08-02 DIAGNOSIS — I25118 Atherosclerotic heart disease of native coronary artery with other forms of angina pectoris: Secondary | ICD-10-CM

## 2019-08-02 DIAGNOSIS — R600 Localized edema: Secondary | ICD-10-CM

## 2019-08-02 DIAGNOSIS — I10 Essential (primary) hypertension: Secondary | ICD-10-CM | POA: Diagnosis not present

## 2019-08-02 MED ORDER — FUROSEMIDE 40 MG PO TABS: 40.0000 mg | ORAL_TABLET | Freq: Every day | ORAL | 3 refills | Status: DC

## 2019-08-02 NOTE — Patient Instructions (Addendum)
Medication Instructions: INCREASE Lasix to 40 mg daily  Labwork: None  Procedures/Testing: Your physician has requested that you have an echocardiogram. Echocardiography is a painless test that uses sound waves to create images of your heart. It provides your doctor with information about the size and shape of your heart and how well your heart's chambers and valves are working. This procedure takes approximately one hour. There are no restrictions for this procedure.    Follow-Up: 6 months with Physician Assistant  Any Additional Special Instructions Will Be Listed Below (If Applicable).     If you need a refill on your cardiac medications before your next appointment, please call your pharmacy.

## 2019-08-02 NOTE — Addendum Note (Signed)
Addended by: Barbarann Ehlers A on: 08/02/2019 09:40 AM   Modules accepted: Orders

## 2019-08-02 NOTE — Progress Notes (Signed)
SUBJECTIVE: The patient presents for follow-up of coronary artery disease, valvular heart disease, and chronic systolic and diastolic heart failure.  Echocardiogram 04/23/16 showed mildly reduced left ventricular systolic function, [id number] [id number], with no significant change from echo dated 06/06/15. There was moderate concentric LVH with grade 2 diastolic dysfunction and wall motion abnormalities c/w CAD, mild aortic, mild tricuspid, and moderate mitral regurgitation with severe left atrial dilatation.  She underwent coronary angiography on 06/06/15 which demonstrated 100% distal LAD stenosis but otherwise had essentially normal coronary arteries.  She denies chest pain or shortness of breath.  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  She sees Dr. Luna Glasgow.  She has been taking Lasix 20 mg and sometimes 40 mg due to bilateral ankle and feet swelling.  She does not take it daily.  She denies orthopnea and paroxysmal nocturnal dyspnea.    Review of Systems: As per "subjective", otherwise negative.  Allergies  Allergen Reactions  . Vicodin [Hydrocodone-Acetaminophen] Other (See Comments)    Severe nausea and diarrhea   . Amoxicillin     edema  . Demerol [Meperidine] Nausea And Vomiting  . Lisinopril Other (See Comments)    Throat swollen  . Lodine [Etodolac] Rash    Current Outpatient Medications  Medication Sig Dispense Refill  . acetaminophen (TYLENOL) 500 MG tablet Take 500 mg by mouth every 6 (six) hours as needed for mild pain or moderate pain. Reported on 04/22/2016    . aspirin 81 MG chewable tablet Chew 1 tablet (81 mg total) by mouth daily.    Marland Kitchen atorvastatin (LIPITOR) 80 MG tablet Take 1 tablet (80 mg total) by mouth at bedtime. 90 tablet 3  . ciprofloxacin (CIPRO) 500 MG tablet Take 1 tablet (500 mg total) by mouth 2 (two) times daily. 14 tablet 0  . gabapentin (NEURONTIN) 300 MG capsule Take 300 mg by mouth at bedtime.     Marland Kitchen levothyroxine (SYNTHROID)  100 MCG tablet TAKE 1 TABLET BY MOUTH ONCE DAILY BEFORE BREAKFAST 90 tablet 0  . loratadine (CLARITIN) 10 MG tablet Take 10 mg by mouth daily as needed for allergies.    . metoprolol tartrate (LOPRESSOR) 25 MG tablet TAKE 25 MG BY MOUTH TWICE DAILY 180 tablet 3  . nitroGLYCERIN (NITROSTAT) 0.4 MG SL tablet DISSOLVE ONE TABLET UNDER THE TONGUE EVERY 5 MINUTES AS NEEDED FOR CHEST PAIN.  DO NOT EXCEED A TOTAL OF 3 DOSES IN 15 MINUTES 25 tablet 3  . phenazopyridine (PYRIDIUM) 100 MG tablet Take 1 tablet (100 mg total) by mouth 3 (three) times daily as needed for pain. 10 tablet 0  . potassium chloride SA (KLOR-CON M15) 15 MEQ tablet Take 1 tablet (15 mEq total) by mouth 2 (two) times daily. 180 tablet 3  . RANEXA 1000 MG SR tablet Take 1 tablet by mouth twice daily 180 tablet 0  . furosemide (LASIX) 20 MG tablet Take 1 tablet (20 mg total) by mouth daily. 90 tablet 3   No current facility-administered medications for this visit.     Past Medical History:  Diagnosis Date  . Acute on chronic combined systolic (congestive) and diastolic (congestive) heart failure ([place]) 04/23/2016.   EF 45% and grade 2 diastolic dysfunction.  Marland Kitchen CAD (coronary artery disease)    a. cath 06/07/15 Dist LAD 100%, mid LAD 10%.  . Essential hypertension   . Hip pain   . Hyperlipidemia   . Hypothyroidism   . MI (myocardial infarction) ([place]) 06/05/2015  .  Vitamin D deficiency     Past Surgical History:  Procedure Laterality Date  . ABDOMINAL HYSTERECTOMY    . CARDIAC CATHETERIZATION N/A 06/06/2015   Procedure: Left Heart Cath and Coronary Angiography;  Surgeon: Peter M Martinique, MD;  Location: [facility];  Service: Cardiovascular;  Laterality: N/A;  . CHOLECYSTECTOMY    . COLONOSCOPY    . COLONOSCOPY N/A 08/13/2017   Procedure: COLONOSCOPY;  Surgeon: Daneil Dolin, MD;  [facility];  Service: Endoscopy;  Laterality: N/A;  8:30 AM  . ESOPHAGOGASTRODUODENOSCOPY N/A 09/11/2016   Procedure:  ESOPHAGOGASTRODUODENOSCOPY (EGD);  Surgeon: Daneil Dolin, MD;  [facility];  Service: Endoscopy;  Laterality: N/A;  7:45 am - moved to 10/11 @ 10:30  . Heel spurs    . MALONEY DILATION N/A 09/11/2016   Procedure: Venia Minks DILATION;  Surgeon: Daneil Dolin, MD;  [facility];  Service: Endoscopy;  Laterality: N/A;  . POLYPECTOMY  08/13/2017   Procedure: POLYPECTOMY;  Surgeon: Daneil Dolin, MD;  [facility];  Service: Endoscopy;;  colon  . SHOULDER ARTHROSCOPY    . TUBAL LIGATION      Social History   Socioeconomic History  . Marital status: Single    Spouse name: Not on file  . Number of children: Not on file  . Years of education: Not on file  . Highest education level: Not on file  Occupational History  . Not on file  Social Needs  . Financial resource strain: Not on file  . Food insecurity    Worry: Not on file    Inability: Not on file  . Transportation needs    Medical: Not on file    Non-medical: Not on file  Tobacco Use  . Smoking status: Former Smoker    Types: Cigarettes    Start date: 12/02/1977    Quit date: 12/02/2002    Years since quitting: 16.6  . Smokeless tobacco: Never Used  Substance and Sexual Activity  . Alcohol use: No    Alcohol/week: 0.0 standard drinks  . Drug use: No  . Sexual activity: Yes  Lifestyle  . Physical activity    Days per week: Not on file    Minutes per session: Not on file  . Stress: Not on file  Relationships  . Social Herbalist on phone: Not on file    Gets together: Not on file    Attends religious service: Not on file    Active member of club or organization: Not on file    Attends meetings of clubs or organizations: Not on file    Relationship status: Not on file  . Intimate partner violence    Fear of current or ex partner: Not on file    Emotionally abused: Not on file    Physically abused: Not on file    Forced sexual activity: Not on file  Other Topics Concern  .  Not on file  Social History Narrative  . Not on file     Vitals:   08/02/19 0843  BP: (!) 152/89  Pulse: 71  Temp: (!) 97.1 F (36.2 C)  Weight: 205 lb (93 kg)  Height: 5' (1.524 m)    Wt Readings from Last 3 Encounters:  08/02/19 205 lb (93 kg)  07/20/19 213 lb (96.6 kg)  07/02/19 214 lb (97.1 kg)     PHYSICAL EXAM General: NAD HEENT: Normal. Neck: No JVD, no thyromegaly. Lungs: Clear  to auscultation bilaterally with normal respiratory effort. CV: Regular rate and rhythm, normal S1/S2, no S3/S4, no murmur. No pretibial or periankle edema.  No carotid bruit.   Abdomen: Soft, nontender, no distention.  Neurologic: Alert and oriented.  Psych: Normal affect. Skin: Normal. Musculoskeletal: No gross deformities.    ECG: Reviewed above under Subjective   Labs: Lab Results  Component Value Date/Time   K 4.8 05/18/2019 03:33 PM   BUN 19 05/18/2019 03:33 PM   CREATININE 1.14 (H) 05/18/2019 03:33 PM   ALT 10 12/29/2018 08:24 AM   TSH 4.20 12/29/2018 08:24 AM   HGB 12.2 01/24/2019 12:01 PM     Lipids: Lab Results  Component Value Date/Time   LDLCALC 65 01/01/2019 09:52 AM   CHOL 123 01/01/2019 09:52 AM   TRIG 57 01/01/2019 09:52 AM   HDL 45 (L) 01/01/2019 09:52 AM       ASSESSMENT AND PLAN:  1. CAD with NSTEMI and distal 100% LAD stenosiswith exertional angina: Symptomatically improved with increase of Ranexa to 1000 mg twice daily.  Continue aspirin, Lipitor and metoprolol.  She is allergic to ACE inhibitors.  2. Essential HTN: Blood pressure is elevated today but normal at last visit.  This will need further monitoring.  3. Dyslipidemia: Continue high intensity statin therapy with Lipitor 80 mg daily.  Lipids reviewed above.  4. Chronic systolic and diastolic heart failure:  She has had bilateral leg and feet swelling and does have bilateral knee arthritis.  She takes Lasix 20 mg and sometimes 40 mg some days nothing at all.  I will have her take  Lasix 40 mg daily.  She is already on supplements potassium.  I will obtain a follow-up echocardiogram to assess for any interval changes in cardiac structure and function.  5. Valvular heart disease: Stable.  I will obtain a follow-up echocardiogram.   Disposition: Follow up 6 months with [facility]   Kate Sable, M.D., F.A.C.C.

## 2019-08-04 ENCOUNTER — Other Ambulatory Visit: Payer: Self-pay | Admitting: Cardiovascular Disease

## 2019-08-06 ENCOUNTER — Ambulatory Visit (HOSPITAL_COMMUNITY)
Admission: RE | Admit: 2019-08-06 | Discharge: 2019-08-06 | Disposition: A | Discharge status: Home / Self Care | Payer: PPO | Source: Ambulatory Visit | Attending: Cardiovascular Disease | Admitting: Cardiovascular Disease

## 2019-08-06 ENCOUNTER — Other Ambulatory Visit: Payer: Self-pay

## 2019-08-06 DIAGNOSIS — R600 Localized edema: Secondary | ICD-10-CM | POA: Diagnosis not present

## 2019-08-06 NOTE — Progress Notes (Signed)
*  PRELIMINARY RESULTS* Echocardiogram 2D Echocardiogram has been performed.  Leavy Cella 08/06/2019, 11:16 AM

## 2019-09-23 ENCOUNTER — Ambulatory Visit: Payer: PPO

## 2019-09-24 ENCOUNTER — Encounter: Payer: Self-pay | Admitting: Family Medicine

## 2019-09-24 ENCOUNTER — Other Ambulatory Visit: Payer: Self-pay

## 2019-09-24 ENCOUNTER — Ambulatory Visit (INDEPENDENT_AMBULATORY_CARE_PROVIDER_SITE_OTHER): Payer: PPO | Admitting: Family Medicine

## 2019-09-24 DIAGNOSIS — J069 Acute upper respiratory infection, unspecified: Secondary | ICD-10-CM

## 2019-09-24 DIAGNOSIS — Z20822 Contact with and (suspected) exposure to covid-19: Secondary | ICD-10-CM

## 2019-09-24 NOTE — Progress Notes (Signed)
Virtual Visit via Telephone Note  I connected with Maria Fry on 09/24/19 at 9:45am  by telephone and verified that I am speaking with the correct person using two identifiers.      Pt location: at home   Physician location:  In office, [facility], Vic Blackbird MD     On call: patient and physician   I discussed the limitations, risks, security and privacy concerns of performing an evaluation and management service by telephone and the availability of in person appointments. I also discussed with the patient that there may be a patient responsible charge related to this service. The patient expressed understanding and agreed to proceed.   History of Present Illness: Patient called and states that she has had cough with congestion for the past week.  She started with sore throat and diarrhea she had been around family members who have also had respiratory symptoms.  She has not had any fever.  It then progressed with cough and runny nose which she still has.  The cough has minimal production.  She is not taking anything besides some Tylenol.  No shortness of breath.   Observations/Objective: No acute distress noted over the phone  Assessment and Plan: Viral URI- recommend covid testing based on her current symptoms and previous exposures.  She is also high risk for decompensation with COVID-19.  She can also use oral antihistamine she does not tolerate any nasal sprays.  And she can use Coricidin for the cough she states it is not keeping her up at night.  If she does develop any shortness of breath she should go to the emergency room.  Follow Up Instructions:    I discussed the assessment and treatment plan with the patient. The patient was provided an opportunity to ask questions and all were answered. The patient agreed with the plan and demonstrated an understanding of the instructions.   The patient was advised to call back or seek an in-person evaluation if the  symptoms worsen or if the condition fails to improve as anticipated.  I provided  7 minutes of non-face-to-face time during this encounter. End Time 9:52am   Vic Blackbird, MD

## 2019-09-25 LAB — NOVEL CORONAVIRUS, NAA: SARS-CoV-2, NAA: NOT DETECTED

## 2019-11-15 ENCOUNTER — Other Ambulatory Visit: Payer: Self-pay | Admitting: Cardiovascular Disease

## 2019-11-26 ENCOUNTER — Emergency Department (HOSPITAL_COMMUNITY): Payer: PPO

## 2019-11-26 ENCOUNTER — Encounter (HOSPITAL_COMMUNITY): Payer: Self-pay

## 2019-11-26 ENCOUNTER — Inpatient Hospital Stay (HOSPITAL_COMMUNITY)
Admission: EM | Admit: 2019-11-26 | Discharge: 2019-11-30 | DRG: 291 | Disposition: A | Discharge status: Home / Self Care | Payer: PPO | Attending: Internal Medicine | Admitting: Internal Medicine

## 2019-11-26 ENCOUNTER — Other Ambulatory Visit: Payer: Self-pay

## 2019-11-26 DIAGNOSIS — N179 Acute kidney failure, unspecified: Secondary | ICD-10-CM | POA: Diagnosis present

## 2019-11-26 DIAGNOSIS — Z7982 Long term (current) use of aspirin: Secondary | ICD-10-CM

## 2019-11-26 DIAGNOSIS — Z20828 Contact with and (suspected) exposure to other viral communicable diseases: Secondary | ICD-10-CM | POA: Diagnosis not present

## 2019-11-26 DIAGNOSIS — Z9119 Patient's noncompliance with other medical treatment and regimen: Secondary | ICD-10-CM

## 2019-11-26 DIAGNOSIS — I5021 Acute systolic (congestive) heart failure: Secondary | ICD-10-CM | POA: Diagnosis not present

## 2019-11-26 DIAGNOSIS — Z888 Allergy status to other drugs, medicaments and biological substances status: Secondary | ICD-10-CM

## 2019-11-26 DIAGNOSIS — I10 Essential (primary) hypertension: Secondary | ICD-10-CM | POA: Diagnosis present

## 2019-11-26 DIAGNOSIS — D638 Anemia in other chronic diseases classified elsewhere: Secondary | ICD-10-CM | POA: Diagnosis present

## 2019-11-26 DIAGNOSIS — Z91128 Patient's intentional underdosing of medication regimen for other reason: Secondary | ICD-10-CM | POA: Diagnosis not present

## 2019-11-26 DIAGNOSIS — Z885 Allergy status to narcotic agent status: Secondary | ICD-10-CM

## 2019-11-26 DIAGNOSIS — R0602 Shortness of breath: Secondary | ICD-10-CM

## 2019-11-26 DIAGNOSIS — T501X6A Underdosing of loop [high-ceiling] diuretics, initial encounter: Secondary | ICD-10-CM | POA: Diagnosis present

## 2019-11-26 DIAGNOSIS — D649 Anemia, unspecified: Secondary | ICD-10-CM | POA: Diagnosis present

## 2019-11-26 DIAGNOSIS — E876 Hypokalemia: Secondary | ICD-10-CM | POA: Diagnosis not present

## 2019-11-26 DIAGNOSIS — Z79899 Other long term (current) drug therapy: Secondary | ICD-10-CM

## 2019-11-26 DIAGNOSIS — I5023 Acute on chronic systolic (congestive) heart failure: Secondary | ICD-10-CM

## 2019-11-26 DIAGNOSIS — I214 Non-ST elevation (NSTEMI) myocardial infarction: Secondary | ICD-10-CM | POA: Diagnosis present

## 2019-11-26 DIAGNOSIS — Z79890 Hormone replacement therapy (postmenopausal): Secondary | ICD-10-CM

## 2019-11-26 DIAGNOSIS — E559 Vitamin D deficiency, unspecified: Secondary | ICD-10-CM | POA: Diagnosis not present

## 2019-11-26 DIAGNOSIS — R069 Unspecified abnormalities of breathing: Secondary | ICD-10-CM | POA: Diagnosis not present

## 2019-11-26 DIAGNOSIS — Z881 Allergy status to other antibiotic agents status: Secondary | ICD-10-CM

## 2019-11-26 DIAGNOSIS — I248 Other forms of acute ischemic heart disease: Secondary | ICD-10-CM | POA: Diagnosis not present

## 2019-11-26 DIAGNOSIS — E890 Postprocedural hypothyroidism: Secondary | ICD-10-CM | POA: Diagnosis not present

## 2019-11-26 DIAGNOSIS — E782 Mixed hyperlipidemia: Secondary | ICD-10-CM

## 2019-11-26 DIAGNOSIS — R0689 Other abnormalities of breathing: Secondary | ICD-10-CM | POA: Diagnosis not present

## 2019-11-26 DIAGNOSIS — I252 Old myocardial infarction: Secondary | ICD-10-CM

## 2019-11-26 DIAGNOSIS — Z823 Family history of stroke: Secondary | ICD-10-CM

## 2019-11-26 DIAGNOSIS — Z8249 Family history of ischemic heart disease and other diseases of the circulatory system: Secondary | ICD-10-CM

## 2019-11-26 DIAGNOSIS — Z91199 Patient's noncompliance with other medical treatment and regimen due to unspecified reason: Secondary | ICD-10-CM

## 2019-11-26 DIAGNOSIS — Z6841 Body Mass Index (BMI) 40.0 and over, adult: Secondary | ICD-10-CM | POA: Diagnosis not present

## 2019-11-26 DIAGNOSIS — R778 Other specified abnormalities of plasma proteins: Secondary | ICD-10-CM | POA: Diagnosis not present

## 2019-11-26 DIAGNOSIS — E785 Hyperlipidemia, unspecified: Secondary | ICD-10-CM

## 2019-11-26 DIAGNOSIS — Z87891 Personal history of nicotine dependence: Secondary | ICD-10-CM

## 2019-11-26 DIAGNOSIS — G4489 Other headache syndrome: Secondary | ICD-10-CM | POA: Diagnosis not present

## 2019-11-26 DIAGNOSIS — J9601 Acute respiratory failure with hypoxia: Secondary | ICD-10-CM | POA: Diagnosis present

## 2019-11-26 DIAGNOSIS — D509 Iron deficiency anemia, unspecified: Secondary | ICD-10-CM | POA: Diagnosis present

## 2019-11-26 DIAGNOSIS — I5043 Acute on chronic combined systolic (congestive) and diastolic (congestive) heart failure: Secondary | ICD-10-CM | POA: Diagnosis present

## 2019-11-26 DIAGNOSIS — I110 Hypertensive heart disease with heart failure: Secondary | ICD-10-CM | POA: Diagnosis not present

## 2019-11-26 DIAGNOSIS — R0902 Hypoxemia: Secondary | ICD-10-CM | POA: Diagnosis not present

## 2019-11-26 DIAGNOSIS — I2510 Atherosclerotic heart disease of native coronary artery without angina pectoris: Secondary | ICD-10-CM | POA: Diagnosis not present

## 2019-11-26 DIAGNOSIS — R079 Chest pain, unspecified: Secondary | ICD-10-CM | POA: Diagnosis not present

## 2019-11-26 DIAGNOSIS — I509 Heart failure, unspecified: Secondary | ICD-10-CM | POA: Diagnosis not present

## 2019-11-26 DIAGNOSIS — R519 Headache, unspecified: Secondary | ICD-10-CM | POA: Diagnosis present

## 2019-11-26 DIAGNOSIS — E669 Obesity, unspecified: Secondary | ICD-10-CM

## 2019-11-26 LAB — CBC
HCT: 36.7 % (ref 36.0–46.0)
Hemoglobin: 11.5 g/dL — ABNORMAL LOW (ref 12.0–15.0)
MCH: 30.7 pg (ref 26.0–34.0)
MCHC: 31.3 g/dL (ref 30.0–36.0)
MCV: 98.1 fL (ref 80.0–100.0)
Platelets: 273 10*3/uL (ref 150–400)
RBC: 3.74 MIL/uL — ABNORMAL LOW (ref 3.87–5.11)
RDW: 14.8 % (ref 11.5–15.5)
WBC: 10.1 10*3/uL (ref 4.0–10.5)
nRBC: 0 % (ref 0.0–0.2)

## 2019-11-26 MED ORDER — FUROSEMIDE 10 MG/ML IJ SOLN
40.0000 mg | Freq: Once | INTRAMUSCULAR | Status: AC
  Administered 2019-11-27: 40 mg via INTRAVENOUS
  Filled 2019-11-26: qty 4

## 2019-11-26 MED ORDER — SODIUM CHLORIDE 0.9% FLUSH
3.0000 mL | Freq: Once | INTRAVENOUS | Status: AC
  Administered 2019-11-26: 3 mL via INTRAVENOUS

## 2019-11-26 NOTE — ED Provider Notes (Signed)
[facility] EMERGENCY DEPARTMENT Provider Note   CSN: [id number] Arrival date & time: 11/26/19  2218     History No chief complaint on file.   Maria Fry is a 68 y.o. female.  HPI    Patient presents after sudden onset shortness of breath. Patient has a history of CHF, prior MI. She notes that she takes Lasix occasionally, as needed, but not daily. She was in her usual state of health until about 90 minutes prior to ED arrival. By the time she developed dyspnea. No recent medication change, diet change, activity change. Since onset symptoms have improved only with supplemental oxygen provided by EMS providers. She denies pain, fever, cough.  Past Medical History:  Diagnosis Date  . Acute on chronic combined systolic (congestive) and diastolic (congestive) heart failure ([place]) 04/23/2016.   EF 45% and grade 2 diastolic dysfunction.  Marland Kitchen CAD (coronary artery disease)    a. cath 06/07/15 Dist LAD 100%, mid LAD 10%.  . Essential hypertension   . Hip pain   . Hyperlipidemia   . Hypothyroidism   . MI (myocardial infarction) ([place]) 06/05/2015  . Vitamin D deficiency     Patient Active Problem List   Diagnosis Date Noted  . Peripheral edema 05/18/2019  . OA (osteoarthritis) of knee 05/18/2019  . DDD (degenerative disc disease), lumbar 05/18/2019  . Obesity (BMI 30-39.9) 12/26/2017  . Seasonal allergies 08/19/2016  . Glucose intolerance (impaired glucose tolerance) 05/14/2016  . Chronic hip pain 05/14/2016  . Chronic combined systolic and diastolic heart failure ([place]) 05/14/2016  . Hypothyroidism following radioiodine therapy 11/30/2015  . CAD (coronary artery disease)   . NSTEMI (non-ST elevated myocardial infarction) ([place])   . Hypertension 06/05/2015    Past Surgical History:  Procedure Laterality Date  . ABDOMINAL HYSTERECTOMY    . CARDIAC CATHETERIZATION N/A 06/06/2015   Procedure: Left Heart Cath and Coronary Angiography;  Surgeon: Peter M Martinique, MD;  Location: [facility];  Service: Cardiovascular;  Laterality: N/A;  . CHOLECYSTECTOMY    . COLONOSCOPY    . COLONOSCOPY N/A 08/13/2017   Procedure: COLONOSCOPY;  Surgeon: Daneil Dolin, MD;  Location: [facility];  Service: Endoscopy;  Laterality: N/A;  8:30 AM  . ESOPHAGOGASTRODUODENOSCOPY N/A 09/11/2016   Procedure: ESOPHAGOGASTRODUODENOSCOPY (EGD);  Surgeon: Daneil Dolin, MD;  Location: [facility];  Service: Endoscopy;  Laterality: N/A;  7:45 am - moved to 10/11 @ 10:30  . Heel spurs    . MALONEY DILATION N/A 09/11/2016   Procedure: Venia Minks DILATION;  Surgeon: Daneil Dolin, MD;  Location: [facility];  Service: Endoscopy;  Laterality: N/A;  . POLYPECTOMY  08/13/2017   Procedure: POLYPECTOMY;  Surgeon: Daneil Dolin, MD;  Location: [facility];  Service: Endoscopy;;  colon  . SHOULDER ARTHROSCOPY    . TUBAL LIGATION       OB History    Gravida  3   Para  3   Term  3   Preterm      AB      Living        SAB      TAB      Ectopic      Multiple      Live Births              Family History  Problem Relation Age of Onset  . Heart failure Father        Deceased  . Heart attack Father  Deceased  . Stroke Sister        Deceased  . Heart failure Brother        Deceased  . Cancer Brother        Deceased  . Heart attack Brother        Deceased  . Colon cancer Neg Hx     Social History   Tobacco Use  . Smoking status: Former Smoker    Types: Cigarettes    Start date: 12/02/1977    Quit date: 12/02/2002    Years since quitting: 16.9  . Smokeless tobacco: Never Used  Substance Use Topics  . Alcohol use: No    Alcohol/week: 0.0 standard drinks  . Drug use: No    Home Medications Prior to Admission medications   Medication Sig Start Date End Date Taking? Authorizing Provider  acetaminophen (TYLENOL) 500 MG tablet Take 500 mg by mouth every 6 (six) hours as needed for mild pain or moderate pain. Reported on 04/22/2016    [provider]  aspirin 81 MG chewable tablet Chew 1 tablet (81 mg total) by mouth daily. 06/08/15   Bhagat, Crista Luria, PA  atorvastatin (LIPITOR) 80 MG tablet Take 1 tablet (80 mg total) by mouth at bedtime. 12/15/18   [name], MD  ciprofloxacin (CIPRO) 500 MG tablet Take 1 tablet (500 mg total) by mouth 2 (two) times daily. 06/29/19   Alycia Rossetti, MD  furosemide (LASIX) 40 MG tablet Take 1 tablet (40 mg total) by mouth daily. 08/02/19 10/31/19  [name], MD  gabapentin (NEURONTIN) 300 MG capsule Take 300 mg by mouth at bedtime.     [provider]  levothyroxine (SYNTHROID) 100 MCG tablet TAKE 1 TABLET BY MOUTH ONCE DAILY BEFORE BREAKFAST 07/06/19   Cassandria Anger, MD  loratadine (CLARITIN) 10 MG tablet Take 10 mg by mouth daily as needed for allergies.    [provider]  metoprolol tartrate (LOPRESSOR) 25 MG tablet Take 1 tablet by mouth twice daily 11/15/19   [name], MD  nitroGLYCERIN (NITROSTAT) 0.4 MG SL tablet DISSOLVE ONE TABLET UNDER THE TONGUE EVERY 5 MINUTES AS NEEDED FOR CHEST PAIN.  DO NOT EXCEED A TOTAL OF 3 DOSES IN 15 MINUTES 12/15/18   [name], MD  phenazopyridine (PYRIDIUM) 100 MG tablet Take 1 tablet (100 mg total) by mouth 3 (three) times daily as needed for pain. 06/29/19   Wetherington, [name], MD  potassium chloride SA (KLOR-CON M15) 15 MEQ tablet Take 1 tablet (15 mEq total) by mouth 2 (two) times daily. 12/15/18   [name], MD  RANEXA 1000 MG SR tablet Take 1 tablet by mouth twice daily 08/04/19   [name], MD    Allergies    Vicodin [hydrocodone-acetaminophen], Amoxicillin, Demerol [meperidine], Lisinopril, and Lodine [etodolac]  Review of Systems   Review of Systems  Constitutional: Negative for fever.  Respiratory: Negative for shortness of breath.   Cardiovascular: Negative for chest pain.  Musculoskeletal:       Negative aside from HPI  Skin:       Negative aside from HPI    Allergic/Immunologic: Negative for immunocompromised state.  Neurological: Negative for weakness.    Physical Exam Updated Vital Signs BP 126/78   Pulse 74   Temp 98.7 F (37.1 C) (Oral)   Resp 18   Ht 5' (1.524 m)   Wt 97.5 kg   SpO2 98%   BMI 41.99 kg/m   Physical  Exam Vitals and nursing note reviewed.  Constitutional:      General: She is not in acute distress.    Appearance: She is well-developed.  HENT:     Head: Normocephalic and atraumatic.  Eyes:     Conjunctiva/sclera: Conjunctivae normal.  Cardiovascular:     Rate and Rhythm: Normal rate and regular rhythm.  Pulmonary:     Breath sounds: Wheezing present.  Abdominal:     General: There is no distension.  Skin:    General: Skin is warm and dry.  Neurological:     Mental Status: She is alert and oriented to person, place, and time.     Cranial Nerves: No cranial nerve deficit.     ED Results / Procedures / Treatments   Labs (all labs ordered are listed, but only abnormal results are displayed) Labs Reviewed  BASIC METABOLIC PANEL - Abnormal; Notable for the following components:      Result Value   Creatinine, Ser 1.20 (*)    Calcium 8.4 (*)    GFR calc non Af Amer 47 (*)    GFR calc Af Amer 55 (*)    All other components within normal limits  CBC - Abnormal; Notable for the following components:   RBC 3.74 (*)    Hemoglobin 11.5 (*)    All other components within normal limits  HEPATIC FUNCTION PANEL - Abnormal; Notable for the following components:   Albumin 3.4 (*)    All other components within normal limits  BRAIN NATRIURETIC PEPTIDE - Abnormal; Notable for the following components:   B Natriuretic Peptide 835.0 (*)    All other components within normal limits  TROPONIN I (HIGH SENSITIVITY) - Abnormal; Notable for the following components:   Troponin I (High Sensitivity) 69 (*)    All other components within normal limits  TROPONIN I (HIGH SENSITIVITY) - Abnormal; Notable for the  following components:   Troponin I (High Sensitivity) 202 (*)    All other components within normal limits  SARS CORONAVIRUS 2 (TAT 6-24 HRS)    EKG EKG Interpretation  Date/Time:  Friday November 26 2019 22:26:19 EST Ventricular Rate:  85 PR Interval:    QRS Duration: 114 QT Interval:  396 QTC Calculation: 471 R Axis:   51 Text Interpretation: Sinus rhythm Ventricular trigeminy Anteroseptal infarct, old Borderline repolarization abnormality Artifact ST-t wave abnormality Abnormal ECG Confirmed by Carmin Muskrat [phone]) on 11/26/2019 11:24:42 PM   Radiology DG Chest Port 1 View  Result Date: 11/26/2019 CLINICAL DATA:  68 year old female with shortness of breath. History of CHF and coronary artery disease. EXAM: PORTABLE CHEST 1 VIEW COMPARISON:  Chest radiograph dated 01/24/2019 FINDINGS: Bilateral pulmonary densities with predominant involvement of the lower lobes most likely represent pulmonary edema although pneumonia or superimposed pneumonia on background of edema is not excluded. Clinical correlation is recommended. Small pleural effusions may be present. No pneumothorax. There is cardiomegaly. Atherosclerotic calcification of the aortic arch. No acute osseous pathology. IMPRESSION: Cardiomegaly with findings of CHF and pulmonary edema. Superimposed pneumonia is not excluded. Clinical correlation is recommended. Electronically Signed   By: Anner Crete M.D.   On: 11/26/2019 22:59    Procedures Procedures (including critical care time)  CRITICAL CARE Performed by: Carmin Muskrat Total critical care time: 35 minutes Critical care time was exclusive of separately billable procedures and treating other patients. Critical care was necessary to treat or prevent imminent or life-threatening deterioration. Critical care was time spent personally by me on the following activities:  development of treatment plan with patient and/or surrogate as well as nursing, discussions with  consultants, evaluation of patient's response to treatment, examination of patient, obtaining history from patient or surrogate, ordering and performing treatments and interventions, ordering and review of laboratory studies, ordering and review of radiographic studies, pulse oximetry and re-evaluation of patient's condition.   Medications Ordered in ED Medications  sodium chloride flush (NS) 0.9 % injection 3 mL (3 mLs Intravenous Given 11/26/19 2247)  furosemide (LASIX) injection 40 mg (40 mg Intravenous Given 11/27/19 0010)    ED Course  I have reviewed the triage vital signs and the nursing notes.  Pertinent labs & imaging results that were available during my care of the patient were reviewed by me and considered in my medical decision making (see chart for details).    MDM Rules/Calculators/A&P                     With consideration of fluid overload status on initial exam the patient received IV Lasix.   Update:, Patient substantially better. Chart review notable for echocardiogram earlier this year with ejection fraction 45/50%.  Last cath, 4 years ago.   3:12 AM Patient requiring 4 L via nasal cannula, continues to deny any chest pain.  On however, the patient has had 2 troponins, with positive delta of greater than 100, and given concern for acute heart failure exacerbation, she will require admission.  This elderly female with history of congestive heart failure presents with dyspnea. By her admission the patient has been taking Lasix only intermittently, and is found to have likely fluid overloaded status, possibly with demand ischemia, with elevated troponin, with positive delta during her emergency department stay. Though the patient has no chest pain, given these concerns, the patient was admitted for further monitoring, management.  I have discussed the case with both our internal medicine colleagues and our cardiology colleagues as well.  Final Clinical Impression(s) /  ED Diagnoses Final diagnoses:  Acute on chronic systolic congestive heart failure [facility])  Demand ischemia ([place])     Carmin Muskrat, MD 11/27/19 [phone]

## 2019-11-26 NOTE — ED Triage Notes (Signed)
PT arrives via EMS from home with c/o shortness of breath. Pt has hx CHF, reports compliance with medications, EMS reports O2 sats were in the 70's on room air at home- oxygen came up to 97% on non-rebreather, pt unable to tolerate C-pap, EMS left pt on non-rebreather. EMS reports crackles to lungs bilaterally.

## 2019-11-27 ENCOUNTER — Inpatient Hospital Stay (HOSPITAL_COMMUNITY): Payer: PPO

## 2019-11-27 ENCOUNTER — Encounter (HOSPITAL_COMMUNITY): Payer: Self-pay | Admitting: Internal Medicine

## 2019-11-27 DIAGNOSIS — Z20828 Contact with and (suspected) exposure to other viral communicable diseases: Secondary | ICD-10-CM | POA: Diagnosis present

## 2019-11-27 DIAGNOSIS — Z888 Allergy status to other drugs, medicaments and biological substances status: Secondary | ICD-10-CM | POA: Diagnosis not present

## 2019-11-27 DIAGNOSIS — I5021 Acute systolic (congestive) heart failure: Secondary | ICD-10-CM | POA: Diagnosis not present

## 2019-11-27 DIAGNOSIS — E785 Hyperlipidemia, unspecified: Secondary | ICD-10-CM | POA: Diagnosis present

## 2019-11-27 DIAGNOSIS — Z7982 Long term (current) use of aspirin: Secondary | ICD-10-CM | POA: Diagnosis not present

## 2019-11-27 DIAGNOSIS — D649 Anemia, unspecified: Secondary | ICD-10-CM | POA: Diagnosis not present

## 2019-11-27 DIAGNOSIS — I5043 Acute on chronic combined systolic (congestive) and diastolic (congestive) heart failure: Secondary | ICD-10-CM

## 2019-11-27 DIAGNOSIS — E890 Postprocedural hypothyroidism: Secondary | ICD-10-CM

## 2019-11-27 DIAGNOSIS — E782 Mixed hyperlipidemia: Secondary | ICD-10-CM | POA: Diagnosis not present

## 2019-11-27 DIAGNOSIS — I110 Hypertensive heart disease with heart failure: Secondary | ICD-10-CM | POA: Diagnosis present

## 2019-11-27 DIAGNOSIS — Z79890 Hormone replacement therapy (postmenopausal): Secondary | ICD-10-CM | POA: Diagnosis not present

## 2019-11-27 DIAGNOSIS — D638 Anemia in other chronic diseases classified elsewhere: Secondary | ICD-10-CM | POA: Diagnosis present

## 2019-11-27 DIAGNOSIS — R778 Other specified abnormalities of plasma proteins: Secondary | ICD-10-CM | POA: Diagnosis not present

## 2019-11-27 DIAGNOSIS — I5023 Acute on chronic systolic (congestive) heart failure: Secondary | ICD-10-CM | POA: Diagnosis not present

## 2019-11-27 DIAGNOSIS — R519 Headache, unspecified: Secondary | ICD-10-CM | POA: Diagnosis present

## 2019-11-27 DIAGNOSIS — Z885 Allergy status to narcotic agent status: Secondary | ICD-10-CM | POA: Diagnosis not present

## 2019-11-27 DIAGNOSIS — J9601 Acute respiratory failure with hypoxia: Secondary | ICD-10-CM | POA: Diagnosis present

## 2019-11-27 DIAGNOSIS — E876 Hypokalemia: Secondary | ICD-10-CM | POA: Diagnosis present

## 2019-11-27 DIAGNOSIS — N179 Acute kidney failure, unspecified: Secondary | ICD-10-CM | POA: Diagnosis present

## 2019-11-27 DIAGNOSIS — T501X6A Underdosing of loop [high-ceiling] diuretics, initial encounter: Secondary | ICD-10-CM | POA: Diagnosis present

## 2019-11-27 DIAGNOSIS — Z6841 Body Mass Index (BMI) 40.0 and over, adult: Secondary | ICD-10-CM | POA: Diagnosis not present

## 2019-11-27 DIAGNOSIS — E559 Vitamin D deficiency, unspecified: Secondary | ICD-10-CM | POA: Diagnosis present

## 2019-11-27 DIAGNOSIS — I214 Non-ST elevation (NSTEMI) myocardial infarction: Secondary | ICD-10-CM

## 2019-11-27 DIAGNOSIS — Z79899 Other long term (current) drug therapy: Secondary | ICD-10-CM | POA: Diagnosis not present

## 2019-11-27 DIAGNOSIS — Z881 Allergy status to other antibiotic agents status: Secondary | ICD-10-CM | POA: Diagnosis not present

## 2019-11-27 DIAGNOSIS — Z91128 Patient's intentional underdosing of medication regimen for other reason: Secondary | ICD-10-CM | POA: Diagnosis not present

## 2019-11-27 DIAGNOSIS — R079 Chest pain, unspecified: Secondary | ICD-10-CM | POA: Diagnosis not present

## 2019-11-27 DIAGNOSIS — R0602 Shortness of breath: Secondary | ICD-10-CM | POA: Diagnosis present

## 2019-11-27 DIAGNOSIS — I2510 Atherosclerotic heart disease of native coronary artery without angina pectoris: Secondary | ICD-10-CM | POA: Diagnosis present

## 2019-11-27 DIAGNOSIS — I10 Essential (primary) hypertension: Secondary | ICD-10-CM

## 2019-11-27 DIAGNOSIS — Z9119 Patient's noncompliance with other medical treatment and regimen: Secondary | ICD-10-CM | POA: Diagnosis not present

## 2019-11-27 DIAGNOSIS — I509 Heart failure, unspecified: Secondary | ICD-10-CM

## 2019-11-27 DIAGNOSIS — I252 Old myocardial infarction: Secondary | ICD-10-CM | POA: Diagnosis not present

## 2019-11-27 LAB — HEPATIC FUNCTION PANEL
ALT: 19 U/L (ref 0–44)
AST: 20 U/L (ref 15–41)
Albumin: 3.4 g/dL — ABNORMAL LOW (ref 3.5–5.0)
Alkaline Phosphatase: 85 U/L (ref 38–126)
Bilirubin, Direct: 0.1 mg/dL (ref 0.0–0.2)
Indirect Bilirubin: 0.4 mg/dL (ref 0.3–0.9)
Total Bilirubin: 0.5 mg/dL (ref 0.3–1.2)
Total Protein: 7.4 g/dL (ref 6.5–8.1)

## 2019-11-27 LAB — TROPONIN I (HIGH SENSITIVITY)
Troponin I (High Sensitivity): 69 ng/L — ABNORMAL HIGH (ref <18)
Troponin I (High Sensitivity): 202 ng/L (ref <18)
Troponin I (High Sensitivity): 341 ng/L (ref <18)

## 2019-11-27 LAB — BASIC METABOLIC PANEL
Anion gap: 6 (ref 5–15)
BUN: 22 mg/dL (ref 8–23)
CO2: 26 mmol/L (ref 22–32)
Calcium: 8.4 mg/dL — ABNORMAL LOW (ref 8.9–10.3)
Chloride: 107 mmol/L (ref 98–111)
Creatinine, Ser: 1.2 mg/dL — ABNORMAL HIGH (ref 0.44–1.00)
GFR calc Af Amer: 55 mL/min — ABNORMAL LOW (ref >60)
GFR calc non Af Amer: 47 mL/min — ABNORMAL LOW (ref >60)
Glucose, Bld: 78 mg/dL (ref 70–99)
Potassium: 4.2 mmol/L (ref 3.5–5.1)
Sodium: 139 mmol/L (ref 135–145)

## 2019-11-27 LAB — SARS CORONAVIRUS 2 (TAT 6-24 HRS): SARS Coronavirus 2: NEGATIVE

## 2019-11-27 LAB — ECHOCARDIOGRAM COMPLETE
Height: 60 in
Weight: 3440 oz

## 2019-11-27 LAB — HEPARIN LEVEL (UNFRACTIONATED)
Heparin Unfractionated: 0.5 IU/mL (ref 0.30–0.70)
Heparin Unfractionated: 0.32 IU/mL (ref 0.30–0.70)

## 2019-11-27 LAB — PROCALCITONIN: Procalcitonin: <0.1 ng/mL

## 2019-11-27 LAB — HIV ANTIBODY (ROUTINE TESTING W REFLEX): HIV Screen 4th Generation wRfx: NONREACTIVE

## 2019-11-27 LAB — BRAIN NATRIURETIC PEPTIDE: B Natriuretic Peptide: 835 pg/mL — ABNORMAL HIGH (ref 0.0–100.0)

## 2019-11-27 MED ORDER — LEVOTHYROXINE SODIUM 100 MCG PO TABS
100.0000 ug | ORAL_TABLET | Freq: Every day | ORAL | Status: DC
  Administered 2019-11-28 – 2019-11-30 (×3): 100 ug via ORAL
  Filled 2019-11-27 (×2): qty 1

## 2019-11-27 MED ORDER — HEPARIN BOLUS VIA INFUSION
4000.0000 [IU] | Freq: Once | INTRAVENOUS | Status: AC
  Administered 2019-11-27: 4000 [IU] via INTRAVENOUS

## 2019-11-27 MED ORDER — ATORVASTATIN CALCIUM 80 MG PO TABS
80.0000 mg | ORAL_TABLET | Freq: Every day | ORAL | Status: DC
  Administered 2019-11-27 – 2019-11-29 (×3): 80 mg via ORAL
  Filled 2019-11-27 (×3): qty 1

## 2019-11-27 MED ORDER — METOPROLOL TARTRATE 25 MG PO TABS
25.0000 mg | ORAL_TABLET | Freq: Two times a day (BID) | ORAL | Status: DC
  Administered 2019-11-27 – 2019-11-30 (×7): 25 mg via ORAL
  Filled 2019-11-27 (×7): qty 1

## 2019-11-27 MED ORDER — SODIUM CHLORIDE 0.9% FLUSH
3.0000 mL | Freq: Two times a day (BID) | INTRAVENOUS | Status: DC
  Administered 2019-11-28 – 2019-11-30 (×4): 3 mL via INTRAVENOUS

## 2019-11-27 MED ORDER — NITROGLYCERIN 0.4 MG SL SUBL: 0.4000 mg | SUBLINGUAL_TABLET | SUBLINGUAL | Status: DC | PRN

## 2019-11-27 MED ORDER — HEPARIN (PORCINE) 25000 UT/250ML-% IV SOLN
1050.0000 [IU]/h | INTRAVENOUS | Status: DC
  Administered 2019-11-27: 950 [IU]/h via INTRAVENOUS
  Administered 2019-11-27: 1000 [IU]/h via INTRAVENOUS
  Filled 2019-11-27 (×2): qty 250

## 2019-11-27 MED ORDER — ONDANSETRON HCL 4 MG/2ML IJ SOLN: 4.0000 mg | Freq: Four times a day (QID) | INTRAMUSCULAR | Status: DC | PRN

## 2019-11-27 MED ORDER — RANOLAZINE ER 500 MG PO TB12
1000.0000 mg | ORAL_TABLET | Freq: Two times a day (BID) | ORAL | Status: DC
  Administered 2019-11-27 – 2019-11-30 (×6): 1000 mg via ORAL
  Filled 2019-11-27 (×10): qty 2

## 2019-11-27 MED ORDER — ASPIRIN 81 MG PO CHEW
81.0000 mg | CHEWABLE_TABLET | Freq: Every day | ORAL | Status: DC
  Administered 2019-11-27 – 2019-11-30 (×4): 81 mg via ORAL
  Filled 2019-11-27 (×5): qty 1

## 2019-11-27 MED ORDER — SODIUM CHLORIDE 0.9 % IV SOLN: 250.0000 mL | INTRAVENOUS | Status: DC | PRN

## 2019-11-27 MED ORDER — ACETAMINOPHEN 325 MG PO TABS
650.0000 mg | ORAL_TABLET | Freq: Four times a day (QID) | ORAL | Status: DC | PRN
  Administered 2019-11-27 – 2019-11-28 (×3): 650 mg via ORAL
  Filled 2019-11-27 (×3): qty 2

## 2019-11-27 MED ORDER — SODIUM CHLORIDE 0.9% FLUSH: 3.0000 mL | INTRAVENOUS | Status: DC | PRN

## 2019-11-27 MED ORDER — FUROSEMIDE 10 MG/ML IJ SOLN
40.0000 mg | Freq: Two times a day (BID) | INTRAMUSCULAR | Status: DC
  Administered 2019-11-27 – 2019-11-28 (×2): 40 mg via INTRAVENOUS
  Filled 2019-11-27 (×2): qty 4

## 2019-11-27 NOTE — Progress Notes (Signed)
ANTICOAGULATION CONSULT NOTE - Initial Consult  Pharmacy Consult for Heparin Indication: chest pain/ACS  Allergies  Allergen Reactions  . Vicodin [Hydrocodone-Acetaminophen] Other (See Comments)    Severe nausea and diarrhea   . Amoxicillin     edema  . Demerol [Meperidine] Nausea And Vomiting  . Lisinopril Other (See Comments)    Throat swollen  . Lodine [Etodolac] Rash    Patient Measurements: Height: 5' (152.4 cm) Weight: 215 lb (97.5 kg) IBW/kg (Calculated) : 45.5 Heparin Dosing Weight: 69 kg  Vital Signs: BP: 127/67 (12/26 1000) Pulse Rate: 71 (12/26 1000)  Labs: Recent Labs    11/26/19 2325 11/27/19 0135 11/27/19 0948  HGB 11.5*  --   --   HCT 36.7  --   --   PLT 273  --   --   HEPARINUNFRC  --   --  0.50  CREATININE 1.20*  --   --   TROPONINIHS 69* 202*  --     Estimated Creatinine Clearance: 48.3 mL/min (A) (by C-G formula based on SCr of 1.2 mg/dL (H)).   Medical History: Past Medical History:  Diagnosis Date  . Acute on chronic combined systolic (congestive) and diastolic (congestive) heart failure ([place]) 04/23/2016.   EF 45% and grade 2 diastolic dysfunction.  Marland Kitchen CAD (coronary artery disease)    a. cath 06/07/15 Dist LAD 100%, mid LAD 10%.  . Essential hypertension   . Hip pain   . Hyperlipidemia   . Hypothyroidism   . MI (myocardial infarction) ([place]) 06/05/2015  . Vitamin D deficiency     Medications:  See electronic med rec   Assessment: 68 y.o. F presents with CP. To begin heparin for ACS. Trop 202. No AC PTA. Hgb 11.5, plt wnl at baseline. HL 0.50, therapeutic   Goal of Therapy:  Heparin level 0.3-0.7 units/ml Monitor platelets by anticoagulation protocol: Yes   Plan:  Heparin gtt at 950 units/hr Daily heparin level and CBC  Donna Christen Keano [place], PharmD, MBA, BCGP Clinical Pharmacist  11/27/2019,11:07 AM

## 2019-11-27 NOTE — H&P (Signed)
History and Physical  Maria Fry [id number] DOB: 1951/05/21 DOA: 11/26/2019  Referring physician: Lorelee Cover MD PCP: Alycia Rossetti, MD  Patient coming from: Home  Chief Complaint: Shortness of breath  HPI: Maria Fry is a 68 y.o. female with medical history significant for HFrEF, hypothyroidism, hypertension and hyperlipidemia who presents to the emergency department via EMS due to shortness of breath which started few hours prior to arrival.  Patient states that she was at home watching TV when she suddenly developed shortness of breath, she complains of dizziness on standing, though she denies chest pain, she states that her symptoms were similar to how she felt when she first had a heart attack, so she called 911, patient was provided with supplemental oxygen via [facility] en route to the ED by EMS team.  She states that she has been taking Lasix only intermittently.  She endorsed leg swelling in the evening, but this usually subside when she wakes up in the morning.  She states fever, chills, diaphoresis, abdominal pain, nausea or vomiting.  ED Course:  In the emergency department, she was initially tachypneic, patient was weaned down to supplemental oxygen via [place] at 4lpm with an O2 sat of 97%.  Work-up in the ED showed elevated BNP at 835, initial troponin was 69, subsequent troponin was 202.  X-ray showed cardiomegaly with findings of CHF and pulmonary edema with suspicion for superimposed pneumonia.  She was treated with IV Lasix 40 mg.  IV heparin drip was started due to NSTEMI.Cardiologist was consulted by ED physician and recommended for patient to be transferred to [facility].  Hospitalist was asked to admit patient for further evaluation and management.  I spoke with Dr. Einar Gip (cardiologist at [facility]) and he agreed to see patient once patient arrives at [facility].  Review of Systems: Constitutional: Negative for chills and fever.  HENT: Negative for ear pain and sore throat.     Eyes: Negative for pain and visual disturbance.  Respiratory: Positive for  shortness of breath.  Negative for chest tightness    Cardiovascular: Negative for chest pain and palpitations.  Gastrointestinal: Negative for abdominal pain and vomiting.  Endocrine: Negative for polyphagia and polyuria.  Genitourinary: Negative for decreased urine volume, dysuria Musculoskeletal: Negative for arthralgias and back pain.  Skin: Negative for color change and rash.  Allergic/Immunologic: Negative for immunocompromised state.  Neurological: Negative for tremors, syncope, speech difficulty, weakness, light-headedness and headaches.  Hematological: Does not bruise/bleed easily.  Review of systems as noted in the HPI. All other systems reviewed and are negative.   Past Medical History:  Diagnosis Date  . Acute on chronic combined systolic (congestive) and diastolic (congestive) heart failure ([place]) 04/23/2016.   EF 45% and grade 2 diastolic dysfunction.  Marland Kitchen CAD (coronary artery disease)    a. cath 06/07/15 Dist LAD 100%, mid LAD 10%.  . Essential hypertension   . Hip pain   . Hyperlipidemia   . Hypothyroidism   . MI (myocardial infarction) ([place]) 06/05/2015  . Vitamin D deficiency    Past Surgical History:  Procedure Laterality Date  . ABDOMINAL HYSTERECTOMY    . CARDIAC CATHETERIZATION N/A 06/06/2015   Procedure: Left Heart Cath and Coronary Angiography;  Surgeon: Peter M Martinique, MD;  Location: [facility];  Service: Cardiovascular;  Laterality: N/A;  . CHOLECYSTECTOMY    . COLONOSCOPY    . COLONOSCOPY N/A 08/13/2017   Procedure: COLONOSCOPY;  Surgeon: Daneil Dolin, MD;  Location: [facility];  Service: Endoscopy;  Laterality: N/A;  8:30 AM  . ESOPHAGOGASTRODUODENOSCOPY N/A 09/11/2016   Procedure: ESOPHAGOGASTRODUODENOSCOPY (EGD);  Surgeon: Daneil Dolin, MD;  Location: [facility];  Service: Endoscopy;  Laterality: N/A;  7:45 am - moved to 10/11 @ 10:30  . Heel spurs    . MALONEY  DILATION N/A 09/11/2016   Procedure: Venia Minks DILATION;  Surgeon: Daneil Dolin, MD;  Location: [facility];  Service: Endoscopy;  Laterality: N/A;  . POLYPECTOMY  08/13/2017   Procedure: POLYPECTOMY;  Surgeon: Daneil Dolin, MD;  Location: [facility];  Service: Endoscopy;;  colon  . SHOULDER ARTHROSCOPY    . TUBAL LIGATION      Social History:  reports that she quit smoking about 16 years ago. Her smoking use included cigarettes. She started smoking about 42 years ago. She has never used smokeless tobacco. She reports that she does not drink alcohol or use drugs.   Allergies  Allergen Reactions  . Vicodin [Hydrocodone-Acetaminophen] Other (See Comments)    Severe nausea and diarrhea   . Amoxicillin     edema  . Demerol [Meperidine] Nausea And Vomiting  . Lisinopril Other (See Comments)    Throat swollen  . Lodine [Etodolac] Rash    Family History  Problem Relation Age of Onset  . Heart failure Father        Deceased  . Heart attack Father        Deceased  . Stroke Sister        Deceased  . Heart failure Brother        Deceased  . Cancer Brother        Deceased  . Heart attack Brother        Deceased  . Colon cancer Neg Hx      Prior to Admission medications   Medication Sig Start Date End Date Taking? Authorizing Provider  acetaminophen (TYLENOL) 500 MG tablet Take 500 mg by mouth every 6 (six) hours as needed for mild pain or moderate pain. Reported on 04/22/2016    [provider]  aspirin 81 MG chewable tablet Chew 1 tablet (81 mg total) by mouth daily. 06/08/15   Bhagat, Crista Luria, PA  atorvastatin (LIPITOR) 80 MG tablet Take 1 tablet (80 mg total) by mouth at bedtime. 12/15/18   [name], MD  ciprofloxacin (CIPRO) 500 MG tablet Take 1 tablet (500 mg total) by mouth 2 (two) times daily. 06/29/19   Alycia Rossetti, MD  furosemide (LASIX) 40 MG tablet Take 1 tablet (40 mg total) by mouth daily. 08/02/19 10/31/19  [name], MD    gabapentin (NEURONTIN) 300 MG capsule Take 300 mg by mouth at bedtime.     [provider]  levothyroxine (SYNTHROID) 100 MCG tablet TAKE 1 TABLET BY MOUTH ONCE DAILY BEFORE BREAKFAST 07/06/19   Cassandria Anger, MD  loratadine (CLARITIN) 10 MG tablet Take 10 mg by mouth daily as needed for allergies.    [provider]  metoprolol tartrate (LOPRESSOR) 25 MG tablet Take 1 tablet by mouth twice daily 11/15/19   [name], MD  nitroGLYCERIN (NITROSTAT) 0.4 MG SL tablet DISSOLVE ONE TABLET UNDER THE TONGUE EVERY 5 MINUTES AS NEEDED FOR CHEST PAIN.  DO NOT EXCEED A TOTAL OF 3 DOSES IN 15 MINUTES 12/15/18   [name], MD  phenazopyridine (PYRIDIUM) 100 MG tablet Take 1 tablet (100 mg total) by mouth 3 (three) times daily as needed for pain. 06/29/19  [place], [name], MD  potassium chloride SA (KLOR-CON M15) 15 MEQ tablet Take 1 tablet (15 mEq total) by mouth 2 (two) times daily. 12/15/18   [name], MD  RANEXA 1000 MG SR tablet Take 1 tablet by mouth twice daily 08/04/19   [name], MD    Physical Exam: BP 133/77   Pulse 77   Temp 98.7 F (37.1 C) (Oral)   Resp 18   Ht 5' (1.524 m)   Wt 97.5 kg   SpO2 100%   BMI 41.99 kg/m   . General: 68 y.o. year-old female well developed well nourished in no acute distress.  Alert and oriented x3. Marland Kitchen HEENT: Normocephalic, atraumatic . NECK: Supple, trachea medial . Cardiovascular: Regular rate and rhythm with no rubs or gallops.  No thyromegaly or JVD noted.  No lower extremity edema. 2/4 pulses in all 4 extremities. Marland Kitchen Respiratory: Diffuse crackles was in lower lobes bilaterally.  No accessory muscle use . Abdomen: Soft nontender nondistended with normal bowel sounds x4 quadrants. . Muskuloskeletal: No cyanosis, clubbing or edema noted bilaterally . Neuro: CN II-XII intact, strength, sensation, reflexes . Skin: No ulcerative lesions noted or rashes . Psychiatry: Judgement and insight  appear normal. Mood is appropriate for condition and setting          Labs on Admission:  Basic Metabolic Panel: Recent Labs  Lab 11/26/19 2325  NA 139  K 4.2  CL 107  CO2 26  GLUCOSE 78  BUN 22  CREATININE 1.20*  CALCIUM 8.4*   Liver Function Tests: Recent Labs  Lab 11/26/19 2325  AST 20  ALT 19  ALKPHOS 85  BILITOT 0.5  PROT 7.4  ALBUMIN 3.4*   No results for input(s): LIPASE, AMYLASE in the last 168 hours. No results for input(s): AMMONIA in the last 168 hours. CBC: Recent Labs  Lab 11/26/19 2325  WBC 10.1  HGB 11.5*  HCT 36.7  MCV 98.1  PLT 273   Cardiac Enzymes: No results for input(s): CKTOTAL, CKMB, CKMBINDEX, TROPONINI in the last 168 hours.  BNP (last 3 results) Recent Labs    01/24/19 1206 11/26/19 2325  BNP 1,025.0* 835.0*    ProBNP (last 3 results) No results for input(s): PROBNP in the last 8760 hours.  CBG: No results for input(s): GLUCAP in the last 168 hours.  Radiological Exams on Admission: DG Chest Port 1 View  Result Date: 11/26/2019 CLINICAL DATA:  68 year old female with shortness of breath. History of CHF and coronary artery disease. EXAM: PORTABLE CHEST 1 VIEW COMPARISON:  Chest radiograph dated 01/24/2019 FINDINGS: Bilateral pulmonary densities with predominant involvement of the lower lobes most likely represent pulmonary edema although pneumonia or superimposed pneumonia on background of edema is not excluded. Clinical correlation is recommended. Small pleural effusions may be present. No pneumothorax. There is cardiomegaly. Atherosclerotic calcification of the aortic arch. No acute osseous pathology. IMPRESSION: Cardiomegaly with findings of CHF and pulmonary edema. Superimposed pneumonia is not excluded. Clinical correlation is recommended. Electronically Signed   By: Anner Crete M.D.   On: 11/26/2019 22:59    EKG: I independently viewed the EKG done and my findings are as followed: Sinus rhythm at 85 bpm with  nonspecific ST-T wave abnormality  Assessment/Plan Present on Admission: . NSTEMI (non-ST elevated myocardial infarction) ([place]) . Hypertension . CAD (coronary artery disease) . Hypothyroidism following radioiodine therapy  Principal Problem:   Acute exacerbation of CHF (congestive heart failure) (HCC) Active Problems:   Hypertension   NSTEMI (  non-ST elevated myocardial infarction) ([place])   CAD (coronary artery disease)   Hypothyroidism following radioiodine therapy   Obesity   Hyperlipidemia   Pneumonia   Acute exacerbation of CHF(HFrEF) Patient will be admitted to telemetry unit  X-ray showed cardiomegaly with findings of CHF and pulmonary edema Continue total input/output, daily weights and fluid restriction Continue IV Lasix 40 milligrams twice daily as tolerated per BP Continue Cardiac diet  Echocardiogram in the morning   Echocardiogram done on 08/06/2019 IMPRESSIONS   1. The left ventricle has mildly reduced systolic function, with an ejection fraction of 45-50%. The cavity size was normal. There is moderately increased left ventricular wall thickness. Left ventricular diastolic Doppler parameters are indeterminate.  2. LVEF is mildly depressed with hypokinesis of the inferior, inferoseptal and apical [place] report of 2017, LVEF is relatively unchanged.  3. The right ventricle has normal systolic function.  NSTEMI vs demand ischemia Troponin 69>202; patient denies chest pain.  EKG shows normal sinus rhythm Continue IV heparin drip at this time ED physician consulted with cardiology at [place] [place] who recommended for patient to be transferred to [facility]. Cardiologist at [facility] (Dr. Einar Gip) was already consulted and will be glad to see patient once patient arrives at [facility].  Questionable superimposed pneumonia Patient complaining of transitory cough with white phlegm in route to the ED, but denies fever or chills.  She admitted to improved breathing status after Lasix and  supplemental oxygen. Procalcitonin will be checked prior to starting any antibiotics.  History of CAD She underwent coronary angiography on 06/06/15 which demonstrated 100% distal LAD stenosis but otherwise had essentially normal coronary arteries. Continue aspirin, nitroglycerin, ranolazine and beta-blocker  Hypertension Continue Lasix and beta-blocker  Hypothyroidism Continue levothyroxine  Obesity (BMI 41.99) Patient will be counseled regarding weight loss and exercise when clinically stable  DVT prophylaxis: Heparin drip  Code Status: Full code  Family Communication: None at bedside  Disposition Plan: Home once clinically stable  Consults called: Cardiology (Dr. Einar Gip) at [facility]  Admission status: Inpatient    Bernadette Hoit MD [facility] Pager [phone]  If 7PM-7AM, please contact night-coverage [email or web address] Password [facility]  11/27/2019, 4:45 AM

## 2019-11-27 NOTE — Progress Notes (Signed)
*  PRELIMINARY RESULTS* Echocardiogram 2D Echocardiogram has been performed.  Maria Fry 11/27/2019, 11:14 AM

## 2019-11-27 NOTE — Progress Notes (Signed)
ANTICOAGULATION CONSULT NOTE - Initial Consult  Pharmacy Consult for Heparin Indication: chest pain/ACS  Allergies  Allergen Reactions  . Vicodin [Hydrocodone-Acetaminophen] Other (See Comments)    Severe nausea and diarrhea   . Amoxicillin     edema  . Demerol [Meperidine] Nausea And Vomiting  . Lisinopril Other (See Comments)    Throat swollen  . Lodine [Etodolac] Rash    Patient Measurements: Height: 5' (152.4 cm) Weight: 215 lb (97.5 kg) IBW/kg (Calculated) : 45.5 Heparin Dosing Weight: 69 kg  Vital Signs: Temp: 98.7 F (37.1 C) (12/25 2232) Temp Source: Oral (12/25 2232) BP: 116/59 (12/26 0330) Pulse Rate: 75 (12/26 0330)  Labs: Recent Labs    11/26/19 2325 11/27/19 0135  HGB 11.5*  --   HCT 36.7  --   PLT 273  --   CREATININE 1.20*  --   TROPONINIHS 69* 202*    Estimated Creatinine Clearance: 48.3 mL/min (A) (by C-G formula based on SCr of 1.2 mg/dL (H)).   Medical History: Past Medical History:  Diagnosis Date  . 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   EF 45% and grade 2 diastolic dysfunction.  Marland Kitchen CAD (coronary artery disease)    a. cath 06/07/15 Dist LAD 100%, mid LAD 10%.  . Essential hypertension   . Hip pain   . Hyperlipidemia   . Hypothyroidism   . MI (myocardial infarction) ([place]) 06/05/2015  . Vitamin D deficiency     Medications:  See electronic med rec   Assessment: 68 y.o. F presents with CP. To begin heparin for ACS. Trop 202. No AC PTA. Hgb 11.5, plt wnl at baseline.  Goal of Therapy:  Heparin level 0.3-0.7 units/ml Monitor platelets by anticoagulation protocol: Yes   Plan:  Heparin IV bolus 4000 units Heparin gtt at 950 units/hr Will f/u heparin level in 6 hours Daily heparin level and CBC  Sherlon Handing, PharmD, BCPS Please see amion for complete clinical pharmacist phone list 11/27/2019,4:10 AM

## 2019-11-27 NOTE — Progress Notes (Signed)
Subjective: Chart reviewed and patient briefly seen.  Admitted last night with acute exacerbation of CHF and non-ST elevation MI versus demand ischemia.   Objective: Vital signs in last 24 hours: Temp:  [98.7 F (37.1 C)] 98.7 F (37.1 C) (12/25 2232) Pulse Rate:  [66-78] 72 (12/26 0830) Resp:  [18-28] 18 (12/26 0014) BP: (116-170)/(59-102) 134/72 (12/26 0830) SpO2:  [96 %-100 %] 100 % (12/26 0830) Weight:  [97.5 kg] 97.5 kg (12/25 2224) Vital signs are stable. Intake/Output from previous day: No intake/output data recorded. Intake/Output this shift: No intake/output data recorded.  Recent Labs    11/26/19 2325  HGB 11.5*   Recent Labs    11/26/19 2325  WBC 10.1  RBC 3.74*  HCT 36.7  PLT 273   Recent Labs    11/26/19 2325  NA 139  K 4.2  CL 107  CO2 26  BUN 22  CREATININE 1.20*  GLUCOSE 78  CALCIUM 8.4*     Assessment/Plan: 1.  Acute exacerbation of CHF. 2.  Non-ST elevation MI versus demand ischemia. 3.  Patient is being transferred to [facility] for further treatment with cardiology.   Mykle Pascua C Kaven Cumbie 11/27/2019, 9:57 AM

## 2019-11-27 NOTE — ED Notes (Signed)
[facility] at bedside 

## 2019-11-27 NOTE — Progress Notes (Signed)
[facility] for Heparin Indication: chest pain/ACS  Allergies  Allergen Reactions  . Vicodin [Hydrocodone-Acetaminophen] Other (See Comments)    Severe nausea and diarrhea   . Amoxicillin     edema  . Demerol [Meperidine] Nausea And Vomiting  . Lisinopril Other (See Comments)    Throat swollen  . Lodine [Etodolac] Rash    Patient Measurements: Height: 5' (152.4 cm) Weight: 215 lb 8 oz (97.8 kg) IBW/kg (Calculated) : 45.5 Heparin Dosing Weight: 69 kg  Vital Signs: Temp: 97.6 Fry (36.4 C) (12/26 1942) Temp Source: Oral (12/26 1942) BP: 132/67 (12/26 1942) Pulse Rate: 73 (12/26 1942)  Labs: Recent Labs    11/26/19 2325 11/27/19 0135 11/27/19 0948 11/27/19 1802 11/27/19 1907  HGB 11.5*  --   --   --   --   HCT 36.7  --   --   --   --   PLT 273  --   --   --   --   HEPARINUNFRC  --   --  0.50  --  0.32  CREATININE 1.20*  --   --   --   --   TROPONINIHS 69* 202*  --  341*  --     Estimated Creatinine Clearance: 48.3 mL/min (A) (by C-G formula based on SCr of 1.2 mg/dL (H)).   Medical History: Past Medical History:  Diagnosis Date  . CAD (coronary artery disease)    a. 06/07/15 NSTEMI/Cath: Dist LAD 100%, mid LAD 10%. Otw nl cors. Nl EF w/ inferoapical AK.  Marland Kitchen Chronic combined systolic (congestive) and diastolic (congestive) heart failure ([place]) 04/23/2016   a. 04/2016 Echo: EF 45%, Gr2 DD; b. 11/2019 Echo: EF 45-50%, mod LVH. Gr2 DD. Nl RV fxn. Sev dil LA.   . Essential hypertension   . Hip pain   . Hyperlipidemia   . Hypothyroidism   . MI (myocardial infarction) ([place]) 06/05/2015  . Vitamin D deficiency     Medications:  See electronic med rec   Assessment: 68 y.o. Fry presents with CP. To begin heparin for ACS. Trop 202. No AC PTA. Hgb 11.5, plt wnl at baseline.  Heparin level remains therapeutic at 0.32, on 950 units/hr. No s/sx of bleeding or infusion issues.  Goal of Therapy:  Heparin level 0.3-0.7 units/ml Monitor  platelets by anticoagulation protocol: Yes   Plan:  Increase heparin gtt to 1000 units/hr to keep in goal range Daily heparin level and CBC  Antonietta Jewel, PharmD, BCCCP Clinical Pharmacist  Phone: [phone]  Please check AMION for all [place] phone numbers After 10:00 PM, call [place] [phone] 11/27/2019,8:13 PM

## 2019-11-27 NOTE — Progress Notes (Signed)
CRITICAL VALUE ALERT  Critical Value:  Troponin 341  Date & Time Notied:  11/27/19 1930  Provider Notified: Radford Pax  Orders Received/Actions taken: No orders received

## 2019-11-27 NOTE — ED Notes (Signed)
Date and time results received: 11/27/19 2:37 AM  (use smartphrase ".now" to insert current time)  Test: trop Critical Value: 202  Name of Provider Notified: [place]  Orders Received? Or Actions Taken?: none at this time

## 2019-11-27 NOTE — Consult Note (Addendum)
Cardiology Consult    Patient ID: Maria Fry MRN: [id number], DOB/AGE: 1951-09-02   Admit date: 11/26/2019 Date of Consult: 11/27/2019  Primary Physician: Alycia Rossetti, MD Primary Cardiologist: Kate Sable, MD Requesting Provider: Marlowe Sax, MD  Patient Profile    Maria Fry is a 68 y.o. 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 Posey Pronto.  Past Medical History   Past Medical History:  Diagnosis Date   CAD (coronary artery disease)    a. 06/07/15 NSTEMI/Cath: Dist LAD 100%, mid LAD 10%. Otw nl cors. Nl EF w/ inferoapical AK.   Chronic combined systolic (congestive) and diastolic (congestive) heart failure ([place]) 04/23/2016   a. 04/2016 Echo: EF 45%, Gr2 DD; b. 11/2019 Echo: EF 45-50%, mod LVH. Gr2 DD. Nl RV fxn. Sev dil LA.    Essential hypertension    Hip pain    Hyperlipidemia    Hypothyroidism    MI (myocardial infarction) ([place]) 06/05/2015   Vitamin D deficiency     Past Surgical History:  Procedure Laterality Date   ABDOMINAL HYSTERECTOMY     CARDIAC CATHETERIZATION N/A 06/06/2015   Procedure: Left Heart Cath and Coronary Angiography;  Surgeon: Peter M Martinique, MD;  Location: [facility];  Service: Cardiovascular;  Laterality: N/A;   CHOLECYSTECTOMY     COLONOSCOPY     COLONOSCOPY N/A 08/13/2017   Procedure: COLONOSCOPY;  Surgeon: Daneil Dolin, MD;  Location: [facility];  Service: Endoscopy;  Laterality: N/A;  8:30 AM   ESOPHAGOGASTRODUODENOSCOPY N/A 09/11/2016   Procedure: ESOPHAGOGASTRODUODENOSCOPY (EGD);  Surgeon: Daneil Dolin, MD;  Location: [facility];  Service: Endoscopy;  Laterality: N/A;  7:45 am - moved to 10/11 @ 10:30   Heel spurs     MALONEY DILATION N/A 09/11/2016   Procedure: Venia Minks DILATION;  Surgeon: Daneil Dolin, MD;  Location: [facility];  Service: Endoscopy;  Laterality: N/A;   POLYPECTOMY  08/13/2017   Procedure: POLYPECTOMY;  Surgeon: Daneil Dolin, MD;  Location: [facility];  Service: Endoscopy;;  colon   SHOULDER ARTHROSCOPY     TUBAL LIGATION       Allergies  Allergies  Allergen Reactions   Vicodin [Hydrocodone-Acetaminophen] Other (See Comments)    Severe nausea and diarrhea    Amoxicillin     edema   Demerol [Meperidine] Nausea And Vomiting   Lisinopril Other (See Comments)    Throat swollen   Lodine [Etodolac] Rash    History of Present Illness    Maria Fry is a 68 y.o. ? 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 Posey Pronto.  As noted, she suffered a non-STEMI in 2016 with diagnostic catheterization performed at that time revealing a 10% stenosis in the mid LAD and a total occlusion of the distal LAD.  Medical therapy was recommended.  Subsequent echocardiograms have shown moderate LV dysfunction with an EF of 45%.  She is followed in our [place] office by Dr. Bronson Ing, and was last seen there in August.  Prior to the visit, she was experiencing exertional angina which improved with titration  of Ranexa to 1000 mg twice daily.  Since her visit, she has done reasonably well however, she notes that she does not take her Lasix as prescribed because of the frequent urination associated with using it.  Over the past several weeks, she has rarely been using Lasix however has been eating out frequently.  She has noted increasing dyspnea on exertion with Christmas shopping and chores around her home along with increasing lower extremity swelling.  She presented to the emergency department at [facility] late on December 25, secondary to sudden onset of dyspnea that occurred while watching TV.  She did not  experience chest pain.  EMS was called and she was placed on a nonrebreather.  She was taken to [facility] where she was found to have cardiomegaly with CHF and pulmonary edema on chest x-ray along with suspicion for superimposed pneumonia.  She was treated with IV Lasix in the emergency department.  Lab work notable for a BNP of 835.  Initial high-sensitivity troponin was 69 however, this rose to 202 early this morning.  She reported to the medicine team that symptoms were reminiscent of prior MI and after discussion with cardiology here at [facility], she was transferred for further evaluation.  She currently denies chest pain or dyspnea.  Inpatient Medications     aspirin  81 mg Oral Daily   atorvastatin  80 mg Oral QHS   [START ON 11/28/2019] levothyroxine  100 mcg Oral Q0600   metoprolol tartrate  25 mg Oral BID   ranolazine  1,000 mg Oral BID   sodium chloride flush  3 mL Intravenous Q12H    Family History    Family History  Problem Relation Age of Onset   Heart failure Father        Deceased   Heart attack Father        Deceased   Stroke Sister        Deceased   Heart failure Brother        Deceased   Cancer Brother        Deceased   Heart attack Brother        Deceased   Colon cancer Neg Hx    She indicated that her mother is deceased. She indicated that the status of her father is unknown. She indicated that the status of her sister is unknown. She indicated that the status of her neg hx is unknown.   Social History    Social History   Socioeconomic History   Marital status: Single    Spouse name: Not on file   Number of children: Not on file   Years of education: Not on file   Highest education level: Not on file  Occupational History   Not on file  Tobacco Use   Smoking status: Former Smoker    Types: Cigarettes    Start date: 12/02/1977    Quit date: 12/02/2002    Years since quitting: 16.9   Smokeless tobacco: Never Used  Substance and Sexual Activity   Alcohol  use: No    Alcohol/week: 0.0 standard drinks   Drug use: No   Sexual activity: Yes  Other Topics Concern   Not on file  Social History Narrative   Not on file   Social Determinants of Health   Financial Resource Strain:    Difficulty of Paying Living Expenses: Not on file  Food Insecurity:    Worried About [place] in the Last Year: Not  on file   [place] in the Last Year: Not on file  Transportation Needs:    Lack of Transportation (Medical): Not on file   Lack of Transportation (Non-Medical): Not on file  Physical Activity:    Days of Exercise per Week: Not on file   Minutes of Exercise per Session: Not on file  Stress:    Feeling of Stress : Not on file  Social Connections:    Frequency of Communication with Friends and Family: Not on file   Frequency of Social Gatherings with Friends and Family: Not on file   Attends Religious Services: Not on file   Active Member of Clubs or Organizations: Not on file   Attends Archivist Meetings: Not on file   Marital Status: Not on file  Intimate Partner Violence:    Fear of Current or Ex-Partner: Not on file   Emotionally Abused: Not on file   Physically Abused: Not on file   Sexually Abused: Not on file     Review of Systems    General:  No chills, fever, night sweats or weight changes.  Cardiovascular:  No chest pain, +++ dyspnea on exertion, +++ LE edema - usually @ night, no orthopnea, palpitations, paroxysmal nocturnal dyspnea. Dermatological: No rash, lesions/masses Respiratory: No cough, +++ dyspnea Urologic: No hematuria, dysuria Abdominal:   No nausea, vomiting, diarrhea, bright red blood per rectum, melena, or hematemesis Neurologic:  No visual changes, wkns, changes in mental status. All other systems reviewed and are otherwise negative except as noted above.  Physical Exam    Blood pressure (!) 143/79, pulse 80, temperature 98.1 F (36.7 C), temperature source Oral, resp. rate 18,  height 5' (1.524 m), weight 97.8 kg, SpO2 100 %.  General: Pleasant, NAD Psych: Normal affect. Neuro: Alert and oriented X 3. Moves all extremities spontaneously. HEENT: Normal  Neck: Supple without bruits.  Mild elevation in JVD. Lungs:  Resp regular and unlabored, exp wheezing. Heart: RRR no s3, s4, or murmurs. Abdomen: Soft, non-tender, non-distended, BS + x 4.  Extremities: No clubbing, cyanosis or edema. DP/PT/Radials 2+ and equal bilaterally.  Labs    Cardiac Enzymes Recent Labs  Lab 11/26/19 2325 11/27/19 0135  TROPONINIHS 69* 202*      Lab Results  Component Value Date   WBC 10.1 11/26/2019   HGB 11.5 (L) 11/26/2019   HCT 36.7 11/26/2019   MCV 98.1 11/26/2019   PLT 273 11/26/2019    Recent Labs  Lab 11/26/19 2325  NA 139  K 4.2  CL 107  CO2 26  BUN 22  CREATININE 1.20*  CALCIUM 8.4*  PROT 7.4  BILITOT 0.5  ALKPHOS 85  ALT 19  AST 20  GLUCOSE 78   Lab Results  Component Value Date   CHOL 123 01/01/2019   HDL 45 (L) 01/01/2019   LDLCALC 65 01/01/2019   TRIG 57 01/01/2019    Radiology Studies    CXR 12.25.2020  IMPRESSION: Cardiomegaly with findings of CHF and pulmonary edema. Superimposed pneumonia is not excluded. Clinical correlation is recommended.  ECG & Cardiac Imaging    Regular sinus rhythm, 85, PVCs, prior anteroseptal infarct - personally reviewed.  Assessment & Plan    1.  Acute on chronic combined systolic diastolic congestive heart failure: Patient presented to [facility] on the evening of December 25 with a several week history of progressive dyspnea on exertion and increasing lower extremity swelling.  She was found to have CHF and pulmonary  edema on chest x-ray with an elevated BNP.  She was treated with 1 dose of intravenous Lasix.  High-sensitivity troponin mildly elevated at 69-202.  She denies any chest pain currently or recently.  She continues have mild volume overload on exam and blood pressures trending in the  140s.  Continue beta-blocker therapy and I Antonios Ostrow add Lasix 40 mg IV twice daily for the time being.  It appears she is just a few pounds above her stated dry weight.  She is not on ACE inhibitor/ARB secondary to prior throat swelling on lisinopril.  2.  Essential hypertension: Blood pressure higher since arrival.  Continue beta-blocker and follow with diuresis.  3.  CAD/Demand ischemia: s/p prior NSTEMI in 06/2015 w/ occlusion of the dLAD, which has been medically managed since w/ asa, statin, ? blocker, and ranexa.  She has not been having c/p.  In the setting of #1, HsTrop is elevated @ 69  202.  Cont heparin for the time being and repeat HsTrop.  If down or flat, can dc heparin as this does not appear to represent ACS.  As such, no plan for ischemic eval @ this time.  4.  HL:  Cont statin rx.  Followed by pcp as outpt.  5.  AKI:  Creat 1.2 on admission.  Follow w/ additional diuresis.  6.  Noncompliance:  She is very upfront about not using lasix as Rx.  Strongly encouraged compliance.  Signed, Murray Hodgkins, NP 11/27/2019, 4:09 PM  For questions or updates, please contact   Please consult [email or web address] for contact info under Cardiology/STEMI.  I have seen and examined this patient with Ignacia Bayley.  Agree with above, note added to reflect my findings.  On exam, RRR, no murmurs, lungs clear.  Patient presented to the hospital with shortness of breath.  She endorses noncompliance with her Lasix as well as eating out quite a bit with high salt foods.  She is a few pounds overweight.  We Raidyn Breiner thus plan for IV Lasix at least overnight.  If she is able to wal tomorrow without shortness of breath, she Beryle Bagsby likely be able to be discharged home with improved compliance in her diet and Lasix.  Merritt Kibby M. Nuri Larmer MD 11/27/2019 4:36 PM

## 2019-11-27 NOTE — ED Notes (Signed)
ED TO INPATIENT HANDOFF REPORT  ED Nurse Name and Phone #: [phone]  S Name/Age/Gender Maria Fry 68 y.o. female Room/Bed: APA18/APA18  Code Status   Code Status: Full Code  Home/SNF/Other Home Patient oriented to: self Is this baseline? Yes   Triage Complete: Triage complete  Chief Complaint Acute exacerbation of CHF (congestive heart failure) ([place]) [I50.9]  Triage Note PT arrives via EMS from home with c/o shortness of breath. Pt has hx CHF, reports compliance with medications, EMS reports O2 sats were in the 70's on room air at home- oxygen came up to 97% on non-rebreather, pt unable to tolerate C-pap, EMS left pt on non-rebreather. EMS reports crackles to lungs bilaterally.     Allergies Allergies  Allergen Reactions  . Vicodin [Hydrocodone-Acetaminophen] Other (See Comments)    Severe nausea and diarrhea   . Amoxicillin     edema  . Demerol [Meperidine] Nausea And Vomiting  . Lisinopril Other (See Comments)    Throat swollen  . Lodine [Etodolac] Rash    Level of Care/Admitting Diagnosis ED Disposition    ED Disposition Condition [facility] Area: [place] [[id number]]  Level of Care: Telemetry Cardiac [103]  Covid Evaluation: Asymptomatic Screening Protocol (No Symptoms)  Diagnosis: Acute exacerbation of CHF (congestive heart failure) [facility]) [id number]  Admitting Physician: Bernadette Hoit [id number]  Attending Physician: Bernadette Hoit [id number]  Estimated length of stay: past midnight tomorrow  Certification:: I certify this patient will need inpatient services for at least 2 midnights       B Medical/Surgery History Past Medical History:  Diagnosis Date  . Acute on chronic combined systolic (congestive) and diastolic (congestive) heart failure ([place]) 04/23/2016.   EF 45% and grade 2 diastolic dysfunction.  Marland Kitchen CAD (coronary artery disease)    a. cath 06/07/15 Dist LAD 100%, mid LAD 10%.  . Essential hypertension   . Hip  pain   . Hyperlipidemia   . Hypothyroidism   . MI (myocardial infarction) ([place]) 06/05/2015  . Vitamin D deficiency    Past Surgical History:  Procedure Laterality Date  . ABDOMINAL HYSTERECTOMY    . CARDIAC CATHETERIZATION N/A 06/06/2015   Procedure: Left Heart Cath and Coronary Angiography;  Surgeon: Peter M Martinique, MD;  Location: [facility];  Service: Cardiovascular;  Laterality: N/A;  . CHOLECYSTECTOMY    . COLONOSCOPY    . COLONOSCOPY N/A 08/13/2017   Procedure: COLONOSCOPY;  Surgeon: Daneil Dolin, MD;  [facility];  Service: Endoscopy;  Laterality: N/A;  8:30 AM  . ESOPHAGOGASTRODUODENOSCOPY N/A 09/11/2016   Procedure: ESOPHAGOGASTRODUODENOSCOPY (EGD);  Surgeon: Daneil Dolin, MD;  Location[facility];  Service: Endoscopy;  Laterality: N/A;  7:45 am - moved to 10/11 @ 10:30  . Heel spurs    . MALONEY DILATION N/A 09/11/2016   Procedure: Venia Minks DILATION;  Surgeon: Daneil Dolin, MD;  Location[facility];  Service: Endoscopy;  Laterality: N/A;  . POLYPECTOMY  08/13/2017   Procedure: POLYPECTOMY;  Surgeon: Daneil Dolin, MD;  [facility];  Service: Endoscopy;;  colon  . SHOULDER ARTHROSCOPY    . TUBAL LIGATION       A IV Location/Drains/Wounds Patient Lines/Drains/Airways Status   Active Line/Drains/Airways    Name:   Placement date:   Placement time:   Site:   Days:   Peripheral IV 11/26/19 Anterior;Left;Posterior Hand   11/26/19    2220    Hand   1  Intake/Output Last 24 hours No intake or output data in the 24 hours ending 11/27/19 1255  Labs/Imaging Results for orders placed or performed during the hospital encounter of 11/26/19 (from the past 48 hour(s))  Basic metabolic panel     Status: Abnormal   Collection Time: 11/26/19 11:25 PM  Result Value Ref Range   Sodium 139 135 - 145 mmol/L   Potassium 4.2 3.5 - 5.1 mmol/L   Chloride 107 98 - 111 mmol/L   CO2 26 22 - 32 mmol/L   Glucose, Bld 78 70 - 99 mg/dL    BUN 22 8 - 23 mg/dL   Creatinine, Ser 1.20 (H) 0.44 - 1.00 mg/dL   Calcium 8.4 (L) 8.9 - 10.3 mg/dL   GFR calc non Af Amer 47 (L) >60 mL/min   GFR calc Af Amer 55 (L) >60 mL/min   Anion gap 6 5 - 15    Comment: Performed at [facility], [address], [place], [place] [address]  CBC     Status: Abnormal   Collection Time: 11/26/19 11:25 PM  Result Value Ref Range   WBC 10.1 4.0 - 10.5 K/uL   RBC 3.74 (L) 3.87 - 5.11 MIL/uL   Hemoglobin 11.5 (L) 12.0 - 15.0 g/dL   HCT 36.7 36.0 - 46.0 %   MCV 98.1 80.0 - 100.0 fL   MCH 30.7 26.0 - 34.0 pg   MCHC 31.3 30.0 - 36.0 g/dL   RDW 14.8 11.5 - 15.5 %   Platelets 273 150 - 400 K/uL   nRBC 0.0 0.0 - 0.2 %    Comment: Performed at [facility], [address] [place], [place] [address]  Hepatic function panel     Status: Abnormal   Collection Time: 11/26/19 11:25 PM  Result Value Ref Range   Total Protein 7.4 6.5 - 8.1 g/dL   Albumin 3.4 (L) 3.5 - 5.0 g/dL   AST 20 15 - 41 U/L   ALT 19 0 - 44 U/L   Alkaline Phosphatase 85 38 - 126 U/L   Total Bilirubin 0.5 0.3 - 1.2 mg/dL   Bilirubin, Direct 0.1 0.0 - 0.2 mg/dL   Indirect Bilirubin 0.4 0.3 - 0.9 mg/dL    Comment: Performed at [facility], [address], [place], [place] [address]  Brain natriuretic peptide     Status: Abnormal   Collection Time: 11/26/19 11:25 PM  Result Value Ref Range   B Natriuretic Peptide 835.0 (H) 0.0 - 100.0 pg/mL    Comment: Performed at [facility], [address] [place], [place] [address]  Troponin I (High Sensitivity)     Status: Abnormal   Collection Time: 11/26/19 11:25 PM  Result Value Ref Range   Troponin I (High Sensitivity) 69 (H) <18 ng/L    Comment: (NOTE) Elevated high sensitivity troponin I (hsTnI) values and significant  changes across serial measurements may suggest ACS but many other  chronic and acute conditions are known to elevate hsTnI results.  Refer to the "Links" section for chest pain algorithms and additional   guidance. Performed at [facility], [address] [place],  [address]   Troponin I (High Sensitivity)     Status: Abnormal   Collection Time: 11/27/19  1:35 AM  Result Value Ref Range   Troponin I (High Sensitivity) 202 (HH) <18 ng/L    Comment: CRITICAL RESULT CALLED TO, READ BACK BY AND VERIFIED WITH: NICKOLS,K @ [id number] ON 11/27/19 BY JUW Performed at [facility], [place]  [address], [place], [place] [address]   Procalcitonin - Baseline     Status: None   Collection Time: 11/27/19  1:35 AM  Result Value Ref Range   Procalcitonin <0.10 ng/mL    Comment:        Interpretation: PCT (Procalcitonin) <= 0.5 ng/mL: Systemic infection (sepsis) is not likely. Local bacterial infection is possible. (NOTE)       Sepsis PCT Algorithm           Lower Respiratory Tract                                      Infection PCT Algorithm    ----------------------------     ----------------------------         PCT < 0.25 ng/mL                PCT < 0.10 ng/mL         Strongly encourage             Strongly discourage   discontinuation of antibiotics    initiation of antibiotics    ----------------------------     -----------------------------       PCT 0.25 - 0.50 ng/mL            PCT 0.10 - 0.25 ng/mL               OR       >80% decrease in PCT            Discourage initiation of                                            antibiotics      Encourage discontinuation           of antibiotics    ----------------------------     -----------------------------         PCT >= 0.50 ng/mL              PCT 0.26 - 0.50 ng/mL               AND        <80% decrease in PCT             Encourage initiation of                                             antibiotics       Encourage continuation           of antibiotics    ----------------------------     -----------------------------        PCT >= 0.50 ng/mL                  PCT > 0.50 ng/mL               AND         increase in PCT                   Strongly encourage  initiation of antibiotics    Strongly encourage escalation           of antibiotics                                     -----------------------------                                           PCT <= 0.25 ng/mL                                                 OR                                        > 80% decrease in PCT                                     Discontinue / Do not initiate                                             antibiotics Performed at [facility], [address] [place], [place] [address]   Heparin level (unfractionated)     Status: None   Collection Time: 11/27/19  9:48 AM  Result Value Ref Range   Heparin Unfractionated 0.50 0.30 - 0.70 IU/mL    Comment: (NOTE) If heparin results are below expected values, and patient dosage has  been confirmed, suggest follow up testing of antithrombin III levels. Performed at [facility], [address] [place], [place] [address]    DG Chest [place] 1 View  Result Date: 11/26/2019 CLINICAL DATA:  68 year old female with shortness of breath. History of CHF and coronary artery disease. EXAM: PORTABLE CHEST 1 VIEW COMPARISON:  Chest radiograph dated 01/24/2019 FINDINGS: Bilateral pulmonary densities with predominant involvement of the lower lobes most likely represent pulmonary edema although pneumonia or superimposed pneumonia on background of edema is not excluded. Clinical correlation is recommended. Small pleural effusions may be present. No pneumothorax. There is cardiomegaly. Atherosclerotic calcification of the aortic arch. No acute osseous pathology. IMPRESSION: Cardiomegaly with findings of CHF and pulmonary edema. Superimposed pneumonia is not excluded. Clinical correlation is recommended. Electronically Signed   By: Anner Crete M.D.   On: 11/26/2019 22:59   ECHOCARDIOGRAM COMPLETE  Result Date: 11/27/2019   ECHOCARDIOGRAM REPORT   Patient Name:   Maria Fry Date  of Exam: 11/27/2019 Medical Rec #:  [id number]    Height:       60.0 in Accession #:    [id number]   Weight:       215.0 lb Date of Birth:  [birth year]     BSA:          1.92 m Patient Age:    74 years     BP:           137/72 mmHg Patient Gender: F  HR:           67 bpm. Exam Location:  [facility] Procedure: 2D Echo, Cardiac Doppler and Color Doppler Indications:    CHF-Acute Systolic [id number] / [id number]  History:        Patient has prior history of Echocardiogram examinations, most                 recent 08/06/2019. CHF, Previous Myocardial Infarction and CAD;                 Risk Factors:Hypertension and Dyslipidemia.  Sonographer:    Alvino Chapel RCS Referring Phys: [id number] OLADAPO ADEFESO IMPRESSIONS  1. Left ventricular ejection fraction, by visual estimation, is 45 to 50%. The left ventricle has mildly decreased function. There is moderately increased left ventricular hypertrophy.  2. Left ventricular diastolic parameters are consistent with Grade II diastolic dysfunction (pseudonormalization).  3. The left ventricle demonstrates global hypokinesis.  4. Global right ventricle has normal systolic function.The right ventricular size is normal. No increase in right ventricular wall thickness.  5. Left atrial size was severely dilated.  6. Right atrial size was normal.  7. The mitral valve is normal in structure. No evidence of mitral valve regurgitation. No evidence of mitral stenosis.  8. The tricuspid valve is normal in structure.  9. The aortic valve is normal in structure. Aortic valve regurgitation is trivial. No evidence of aortic valve sclerosis or stenosis. 10. The pulmonic valve was normal in structure. Pulmonic valve regurgitation is not visualized. 11. Normal pulmonary artery systolic pressure. 12. The inferior vena cava is normal in size with greater than 50% respiratory variability, suggesting right atrial pressure of 3 mmHg. 13. No significant change from prior study. FINDINGS  Left Ventricle:  Left ventricular ejection fraction, by visual estimation, is 45 to 50%. The left ventricle has mildly decreased function. The left ventricle demonstrates global hypokinesis. There is moderately increased left ventricular hypertrophy. Left ventricular diastolic parameters are consistent with Grade II diastolic dysfunction (pseudonormalization). Normal left atrial pressure. Right Ventricle: The right ventricular size is normal. No increase in right ventricular wall thickness. Global RV systolic function is has normal systolic function. The tricuspid regurgitant velocity is 2.44 m/s, and with an assumed right atrial pressure  of 3 mmHg, the estimated right ventricular systolic pressure is normal at 26.8 mmHg. Left Atrium: Left atrial size was severely dilated. Right Atrium: Right atrial size was normal in size Pericardium: There is no evidence of pericardial effusion. Mitral Valve: The mitral valve is normal in structure. No evidence of mitral valve regurgitation. No evidence of mitral valve stenosis by observation. Tricuspid Valve: The tricuspid valve is normal in structure. Tricuspid valve regurgitation is trivial. Aortic Valve: The aortic valve is normal in structure. Aortic valve regurgitation is trivial. Aortic regurgitation PHT measures 526 msec. The aortic valve is structurally normal, with no evidence of sclerosis or stenosis. Pulmonic Valve: The pulmonic valve was normal in structure. Pulmonic valve regurgitation is not visualized. Pulmonic regurgitation is not visualized. Aorta: The aortic root, ascending aorta and aortic arch are all structurally normal, with no evidence of dilitation or obstruction. Venous: The inferior vena cava is normal in size with greater than 50% respiratory variability, suggesting right atrial pressure of 3 mmHg. IAS/Shunts: No atrial level shunt detected by color flow Doppler. There is no evidence of a patent foramen ovale. No ventricular septal defect is seen or detected. There is  no evidence of an atrial septal defect.  LEFT VENTRICLE PLAX 2D LVIDd:  5.41 cm       Diastology LVIDs:         4.13 cm       LV e' lateral:   8.11 cm/s LV PW:         1.31 cm       LV E/e' lateral: 9.2 LV IVS:        1.86 cm       LV e' medial:    4.14 cm/s LVOT diam:     1.80 cm       LV E/e' medial:  18.0 LV SV:         66 ml LV SV Index:   31.78 LVOT Area:     2.54 cm  LV Volumes (MOD) LV area d, A2C:    20.80 cm LV area d, A4C:    30.30 cm LV area s, A2C:    13.00 cm LV area s, A4C:    18.80 cm LV major d, A2C:   7.29 cm LV major d, A4C:   8.25 cm LV major s, A2C:   6.10 cm LV major s, A4C:   7.01 cm LV vol d, MOD A2C: 50.1 ml LV vol d, MOD A4C: 91.5 ml LV vol s, MOD A2C: 24.0 ml LV vol s, MOD A4C: 42.8 ml LV SV MOD A2C:     26.1 ml LV SV MOD A4C:     91.5 ml LV SV MOD BP:      37.7 ml RIGHT VENTRICLE RV S prime:     15.55 cm/s TAPSE (M-mode): 2.1 cm LEFT ATRIUM            Index LA diam:      3.80 cm  1.97 cm/m LA Vol (A2C): 117.0 ml 60.78 ml/m  AORTIC VALVE LVOT Vmax:   88.40 cm/s LVOT Vmean:  59.300 cm/s LVOT VTI:    0.172 m AI PHT:      526 msec  AORTA Ao Root diam: 3.10 cm MITRAL VALVE                        TRICUSPID VALVE MV Area (PHT): 3.77 cm             TR Peak grad:   23.8 mmHg MV PHT:        58.29 msec           TR Vmax:        244.00 cm/s MV Decel Time: 201 msec MV E velocity: 74.70 cm/s 103 cm/s  SHUNTS MV A velocity: 31.20 cm/s 70.3 cm/s Systemic VTI:  0.17 m MV E/A ratio:  2.39       1.5       Systemic Diam: 1.80 cm  Candee Furbish MD Electronically signed by Candee Furbish MD Signature Date/Time: 11/27/2019/12:15:46 PM    Final     Pending Labs Unresulted Labs (From admission, onward)    Start     Ordered   11/28/19 0500  Heparin level (unfractionated)  Daily,   R     11/27/19 0440   11/28/19 0500  CBC  Daily,   R     11/27/19 0440   11/28/19 0500  Magnesium  Daily,   R     11/27/19 0506   11/28/19 0500  Phosphorus  Daily,   R     11/27/19 0506   11/28/19 0500   Comprehensive metabolic panel  Daily,   R     11/27/19 0506  11/27/19 1700  Heparin level (unfractionated)  Once-Timed,   STAT     11/27/19 1108   11/27/19 0353  HIV Antibody (routine testing w rflx)  (HIV Antibody (Routine testing w reflex) panel)  Once,   STAT     11/27/19 0408   11/27/19 0104  SARS CORONAVIRUS 2 (TAT 6-24 HRS) Nasopharyngeal Nasopharyngeal Swab  (Tier 3 (TAT 6-24 hrs))  Once,   STAT    Question Answer Comment  Is this test for diagnosis or screening Screening   Symptomatic for COVID-19 as defined by CDC No   Hospitalized for COVID-19 No   Admitted to ICU for COVID-19 No   Previously tested for COVID-19 Yes   Resident in a congregate (group) care setting No   Employed in healthcare setting No   Pregnant No      11/27/19 0104          Vitals/Pain Today's Vitals   11/27/19 1100 11/27/19 1130 11/27/19 1200 11/27/19 1230  BP: 124/75 134/63 133/77 125/85  Pulse: 66 66 70 81  Resp:      Temp:      TempSrc:      SpO2: 100% 100% 100% 100%  Weight:      Height:      PainSc:        Isolation Precautions No active isolations  Medications Medications  sodium chloride flush (NS) 0.9 % injection 3 mL (3 mLs Intravenous Not Given 11/27/19 1045)  sodium chloride flush (NS) 0.9 % injection 3 mL (has no administration in time range)  0.9 %  sodium chloride infusion (has no administration in time range)  ondansetron (ZOFRAN) injection 4 mg (has no administration in time range)  heparin ADULT infusion 100 units/mL (25000 units/221mL sodium chloride 0.45%) (950 Units/hr Intravenous Rate/Dose Verify 11/27/19 1252)  acetaminophen (TYLENOL) tablet 650 mg (has no administration in time range)  sodium chloride flush (NS) 0.9 % injection 3 mL (3 mLs Intravenous Given 11/26/19 2247)  furosemide (LASIX) injection 40 mg (40 mg Intravenous Given 11/27/19 0010)  heparin bolus via infusion 4,000 Units (4,000 Units Intravenous Bolus from Bag 11/27/19 0433)     Mobility walks Low fall risk   Focused Assessments    R Recommendations: See Admitting Provider Note  Report given to:   Additional Notes:

## 2019-11-28 ENCOUNTER — Inpatient Hospital Stay (HOSPITAL_COMMUNITY): Payer: PPO

## 2019-11-28 DIAGNOSIS — D649 Anemia, unspecified: Secondary | ICD-10-CM | POA: Diagnosis present

## 2019-11-28 DIAGNOSIS — E876 Hypokalemia: Secondary | ICD-10-CM | POA: Diagnosis not present

## 2019-11-28 DIAGNOSIS — Z9119 Patient's noncompliance with other medical treatment and regimen: Secondary | ICD-10-CM

## 2019-11-28 DIAGNOSIS — R778 Other specified abnormalities of plasma proteins: Secondary | ICD-10-CM

## 2019-11-28 DIAGNOSIS — Z91199 Patient's noncompliance with other medical treatment and regimen due to unspecified reason: Secondary | ICD-10-CM

## 2019-11-28 DIAGNOSIS — D509 Iron deficiency anemia, unspecified: Secondary | ICD-10-CM | POA: Diagnosis present

## 2019-11-28 DIAGNOSIS — I5023 Acute on chronic systolic (congestive) heart failure: Secondary | ICD-10-CM

## 2019-11-28 LAB — CBC
HCT: 32.4 % — ABNORMAL LOW (ref 36.0–46.0)
Hemoglobin: 10.6 g/dL — ABNORMAL LOW (ref 12.0–15.0)
MCH: 30.7 pg (ref 26.0–34.0)
MCHC: 32.7 g/dL (ref 30.0–36.0)
MCV: 93.9 fL (ref 80.0–100.0)
Platelets: 243 10*3/uL (ref 150–400)
RBC: 3.45 MIL/uL — ABNORMAL LOW (ref 3.87–5.11)
RDW: 14.8 % (ref 11.5–15.5)
WBC: 7.3 10*3/uL (ref 4.0–10.5)
nRBC: 0 % (ref 0.0–0.2)

## 2019-11-28 LAB — MAGNESIUM: Magnesium: 1.9 mg/dL (ref 1.7–2.4)

## 2019-11-28 LAB — COMPREHENSIVE METABOLIC PANEL
ALT: 17 U/L (ref 0–44)
AST: 20 U/L (ref 15–41)
Albumin: 2.9 g/dL — ABNORMAL LOW (ref 3.5–5.0)
Alkaline Phosphatase: 71 U/L (ref 38–126)
Anion gap: 8 (ref 5–15)
BUN: 14 mg/dL (ref 8–23)
CO2: 27 mmol/L (ref 22–32)
Calcium: 8.3 mg/dL — ABNORMAL LOW (ref 8.9–10.3)
Chloride: 102 mmol/L (ref 98–111)
Creatinine, Ser: 0.97 mg/dL (ref 0.44–1.00)
GFR calc Af Amer: >60 mL/min (ref >60)
GFR calc non Af Amer: >60 mL/min (ref >60)
Glucose, Bld: 103 mg/dL — ABNORMAL HIGH (ref 70–99)
Potassium: 3.3 mmol/L — ABNORMAL LOW (ref 3.5–5.1)
Sodium: 137 mmol/L (ref 135–145)
Total Bilirubin: 0.5 mg/dL (ref 0.3–1.2)
Total Protein: 6.5 g/dL (ref 6.5–8.1)

## 2019-11-28 LAB — PHOSPHORUS: Phosphorus: 2.7 mg/dL (ref 2.5–4.6)

## 2019-11-28 LAB — HEPARIN LEVEL (UNFRACTIONATED): Heparin Unfractionated: 0.31 IU/mL (ref 0.30–0.70)

## 2019-11-28 MED ORDER — MAGNESIUM SULFATE 2 GM/50ML IV SOLN
2.0000 g | Freq: Once | INTRAVENOUS | Status: AC
  Administered 2019-11-28: 2 g via INTRAVENOUS
  Filled 2019-11-28: qty 50

## 2019-11-28 MED ORDER — FUROSEMIDE 40 MG PO TABS
40.0000 mg | ORAL_TABLET | Freq: Two times a day (BID) | ORAL | Status: DC
  Administered 2019-11-28 – 2019-11-30 (×4): 40 mg via ORAL
  Filled 2019-11-28 (×4): qty 1

## 2019-11-28 MED ORDER — ONDANSETRON HCL 4 MG/2ML IJ SOLN
4.0000 mg | Freq: Once | INTRAMUSCULAR | Status: AC
  Administered 2019-11-28: 4 mg via INTRAVENOUS
  Filled 2019-11-28: qty 2

## 2019-11-28 MED ORDER — TRAMADOL HCL 50 MG PO TABS
50.0000 mg | ORAL_TABLET | Freq: Four times a day (QID) | ORAL | Status: DC | PRN
  Administered 2019-11-28: 50 mg via ORAL
  Filled 2019-11-28: qty 1

## 2019-11-28 MED ORDER — POTASSIUM CHLORIDE CRYS ER 20 MEQ PO TBCR
40.0000 meq | EXTENDED_RELEASE_TABLET | Freq: Two times a day (BID) | ORAL | Status: DC
  Administered 2019-11-28 – 2019-11-30 (×5): 40 meq via ORAL
  Filled 2019-11-28 (×5): qty 2

## 2019-11-28 MED ORDER — HEPARIN SODIUM (PORCINE) 5000 UNIT/ML IJ SOLN
5000.0000 [IU] | Freq: Three times a day (TID) | INTRAMUSCULAR | Status: DC
  Administered 2019-11-28 – 2019-11-30 (×6): 5000 [IU] via SUBCUTANEOUS
  Filled 2019-11-28 (×7): qty 1

## 2019-11-28 NOTE — Plan of Care (Signed)
  Problem: Education: Goal: Knowledge of General Education information will improve Description: Including pain rating scale, medication(s)/side effects and non-pharmacologic comfort measures Outcome: Progressing   Problem: Clinical Measurements: Goal: Diagnostic test results will improve Outcome: Progressing Goal: Respiratory complications will improve Outcome: Progressing Note: No complaints of shortness of breath. On room air   Problem: Elimination: Goal: Will not experience complications related to urinary retention Outcome: Progressing Note: Reports urinary frequency but is related to lasix administration   Problem: Pain Managment: Goal: General experience of comfort will improve Outcome: Progressing Note: Headache has improved   Problem: Education: Goal: Ability to demonstrate management of disease process will improve Outcome: Progressing Note: Understands the need to be compliant with medications

## 2019-11-28 NOTE — Plan of Care (Signed)

## 2019-11-28 NOTE — Progress Notes (Signed)
PROGRESS NOTE    Maria Fry  [id number] DOB: 03-13-1951 DOA: 11/26/2019 PCP: Alycia Rossetti, MD   Brief Narrative: A68 yo AAW w/ CAD, s/p NSTEMI and CHFadmitted to [facility] 12/26 0400 w/ SOB and + tropoinins - transferred to [facility] for eval. Dyspnea better    Assessment & Plan:   Principal Problem:   Acute exacerbation of CHF (congestive heart failure) ([place]) Active Problems:   Hypertension   NSTEMI (non-ST elevated myocardial infarction) ([address])   CAD (coronary artery disease)   Hypothyroidism following radioiodine therapy   Obesity   Hyperlipidemia   Hypokalemia   Nonadherence to medical treatment   Anemia  Acute on Chronic Systolic/Diastolic CHF  Diurese - cardiology changed to po Lasix  Infiltrates - PA/lat CXR today doubt PNA  CAD elevated troponins ? NSTEMI - Lexiscan myoview tomorrow per cardiology  Hypokalemia - supplement K and Mg (low NL)  Headache - Tramadol and Zofran  Anemia -  Likely chronic dz - check ferritin and B 12 and iron/TIBC  DVT prophylaxis: Heparin Code Status: Full Family Communication: not done - daughter coming - available ifneeded Disposition Plan: Home after cardiac w/u  Consultants:   [facility]  Procedures:    Antimicrobials: NONE  Subjective: No chest pain, dyspnea now Has a headache - frontal - gets these sometimes, no visual changes Asking for Goody's - not allowed caffeine waiting for Myoview Mild associated nausea also  Objective: Vitals:   11/27/19 1942 11/27/19 2201 11/28/19 0528 11/28/19 0801  BP: 132/67 (!) 115/49 119/64 129/81  Pulse: 73 72 74 72  Resp: 18  18 15   Temp: 97.6 F (36.4 C)  97.6 F (36.4 C) 98.3 F (36.8 C)  TempSrc: Oral  Oral Oral  SpO2: 92%  93% 97%  Weight:   96.5 kg   Height:        Intake/Output Summary (Last 24 hours) at 11/28/2019 1133 Last data filed at 11/28/2019 1131 Gross per 24 hour  Intake 987.67 ml  Output 1950 ml  Net -962.33 ml   Filed Weights   11/26/19 2224  11/27/19 1500 11/28/19 0528  Weight: 97.5 kg 97.8 kg 96.5 kg    Examination:  General exam: Appears calm and comfortable  Neck: supple Respiratory system: Clear to auscultation. Respiratory effort normal. Cardiovascular system: S1 & S2 heard, RRR. Gastrointestinal system: Abdomen is nondistended, soft and nontender. No organomegaly or masses felt. Normal bowel sounds heard. Central nervous system: Alert and oriented.  Psychiatry: Mood & affect appropriate.     Data Reviewed: I have personally reviewed following labs and imaging studies  CBC: Recent Labs  Lab 11/26/19 2325 11/28/19 0512  WBC 10.1 7.3  HGB 11.5* 10.6*  HCT 36.7 32.4*  MCV 98.1 93.9  PLT 273 0000000   Basic Metabolic Panel: Recent Labs  Lab 11/26/19 2325 11/28/19 0512  NA 139 137  K 4.2 3.3*  CL 107 102  CO2 26 27  GLUCOSE 78 103*  BUN 22 14  CREATININE 1.20* 0.97  CALCIUM 8.4* 8.3*  MG  --  1.9  PHOS  --  2.7   GFR: Estimated Creatinine Clearance: 59.4 mL/min (by C-G formula based on SCr of 0.97 mg/dL). Liver Function Tests: Recent Labs  Lab 11/26/19 2325 11/28/19 0512  AST 20 20  ALT 19 17  ALKPHOS 85 71  BILITOT 0.5 0.5  PROT 7.4 6.5  ALBUMIN 3.4* 2.9*   Sepsis Labs: Recent Labs  Lab 11/27/19 0135  PROCALCITON <0.10    Recent  Results (from the past 240 hour(s))  SARS CORONAVIRUS 2 (TAT 6-24 HRS) Nasopharyngeal Nasopharyngeal Swab     Status: None   Collection Time: 11/27/19  2:16 AM   Specimen: Nasopharyngeal Swab  Result Value Ref Range Status   SARS Coronavirus 2 NEGATIVE NEGATIVE Final    Comment: (NOTE) SARS-CoV-2 target nucleic acids are NOT DETECTED. The SARS-CoV-2 RNA is generally detectable in upper and lower respiratory specimens during the acute phase of infection. Negative results do not preclude SARS-CoV-2 infection, do not rule out co-infections with other pathogens, and should not be used as the sole basis for treatment or other patient management decisions.  Negative results must be combined with clinical observations, patient history, and epidemiological information. The expected result is Negative. Fact Sheet for Patients: SugarRoll.be Fact Sheet for Healthcare Providers: https://www.woods-mathews.com/ This test is not yet approved or cleared by the [place] FDA and  has been authorized for detection and/or diagnosis of SARS-CoV-2 by FDA under an Emergency Use Authorization (EUA). This EUA will remain  in effect (meaning this test can be used) for the duration of the COVID-19 declaration under Section 56 4(b)(1) of the Act, 21 U.S.C. section 360bbb-3(b)(1), unless the authorization is terminated or revoked sooner. Performed at [facility], [place] [address] [place], [place] [address]          Radiology Studies: DG Chest [place] 1 View  Result Date: 11/26/2019 CLINICAL DATA:  68 year old female with shortness of breath. History of CHF and coronary artery disease. EXAM: PORTABLE CHEST 1 VIEW COMPARISON:  Chest radiograph dated 01/24/2019 FINDINGS: Bilateral pulmonary densities with predominant involvement of the lower lobes most likely represent pulmonary edema although pneumonia or superimposed pneumonia on background of edema is not excluded. Clinical correlation is recommended. Small pleural effusions may be present. No pneumothorax. There is cardiomegaly. Atherosclerotic calcification of the aortic arch. No acute osseous pathology. IMPRESSION: Cardiomegaly with findings of CHF and pulmonary edema. Superimposed pneumonia is not excluded. Clinical correlation is recommended. Electronically Signed   By: Anner Crete M.D.   On: 11/26/2019 22:59   ECHOCARDIOGRAM COMPLETE  Result Date: 11/27/2019   ECHOCARDIOGRAM REPORT   Patient Name:   Maria Fry Date of Exam: 11/27/2019 Medical Rec #:  [id number]    Height:       60.0 in Accession #:    [id number]   Weight:       215.0 lb Date of  Birth:  1951-07-05     BSA:          1.92 m Patient Age:    68 years     BP:           137/72 mmHg Patient Gender: F            HR:           67 bpm. Exam Location:  [facility] Procedure: 2D Echo, Cardiac Doppler and Color Doppler Indications:    CHF-Acute Systolic 123456 / [id number]  History:        Patient has prior history of Echocardiogram examinations, most                 recent 08/06/2019. CHF, Previous Myocardial Infarction and CAD;                 Risk Factors:Hypertension and Dyslipidemia.  Sonographer:    Alvino Chapel RCS Referring Phys: [id number] OLADAPO ADEFESO IMPRESSIONS  1. Left ventricular ejection fraction, by visual estimation, is 45 to 50%. The left  ventricle has mildly decreased function. There is moderately increased left ventricular hypertrophy.  2. Left ventricular diastolic parameters are consistent with Grade II diastolic dysfunction (pseudonormalization).  3. The left ventricle demonstrates global hypokinesis.  4. Global right ventricle has normal systolic function.The right ventricular size is normal. No increase in right ventricular wall thickness.  5. Left atrial size was severely dilated.  6. Right atrial size was normal.  7. The mitral valve is normal in structure. No evidence of mitral valve regurgitation. No evidence of mitral stenosis.  8. The tricuspid valve is normal in structure.  9. The aortic valve is normal in structure. Aortic valve regurgitation is trivial. No evidence of aortic valve sclerosis or stenosis. 10. The pulmonic valve was normal in structure. Pulmonic valve regurgitation is not visualized. 11. Normal pulmonary artery systolic pressure. 12. The inferior vena cava is normal in size with greater than 50% respiratory variability, suggesting right atrial pressure of 3 mmHg. 13. No significant change from prior study. FINDINGS  Left Ventricle: Left ventricular ejection fraction, by visual estimation, is 45 to 50%. The left ventricle has mildly decreased function. The left  ventricle demonstrates global hypokinesis. There is moderately increased left ventricular hypertrophy. Left ventricular diastolic parameters are consistent with Grade II diastolic dysfunction (pseudonormalization). Normal left atrial pressure. Right Ventricle: The right ventricular size is normal. No increase in right ventricular wall thickness. Global RV systolic function is has normal systolic function. The tricuspid regurgitant velocity is 2.44 m/s, and with an assumed right atrial pressure  of 3 mmHg, the estimated right ventricular systolic pressure is normal at 26.8 mmHg. Left Atrium: Left atrial size was severely dilated. Right Atrium: Right atrial size was normal in size Pericardium: There is no evidence of pericardial effusion. Mitral Valve: The mitral valve is normal in structure. No evidence of mitral valve regurgitation. No evidence of mitral valve stenosis by observation. Tricuspid Valve: The tricuspid valve is normal in structure. Tricuspid valve regurgitation is trivial. Aortic Valve: The aortic valve is normal in structure. Aortic valve regurgitation is trivial. Aortic regurgitation PHT measures 526 msec. The aortic valve is structurally normal, with no evidence of sclerosis or stenosis. Pulmonic Valve: The pulmonic valve was normal in structure. Pulmonic valve regurgitation is not visualized. Pulmonic regurgitation is not visualized. Aorta: The aortic root, ascending aorta and aortic arch are all structurally normal, with no evidence of dilitation or obstruction. Venous: The inferior vena cava is normal in size with greater than 50% respiratory variability, suggesting right atrial pressure of 3 mmHg. IAS/Shunts: No atrial level shunt detected by color flow Doppler. There is no evidence of a patent foramen ovale. No ventricular septal defect is seen or detected. There is no evidence of an atrial septal defect.  LEFT VENTRICLE PLAX 2D LVIDd:         5.41 cm       Diastology LVIDs:         4.13 cm        LV e' lateral:   8.11 cm/s LV PW:         1.31 cm       LV E/e' lateral: 9.2 LV IVS:        1.86 cm       LV e' medial:    4.14 cm/s LVOT diam:     1.80 cm       LV E/e' medial:  18.0 LV SV:         66 ml LV SV Index:   31.78  LVOT Area:     2.54 cm  LV Volumes (MOD) LV area d, A2C:    20.80 cm LV area d, A4C:    30.30 cm LV area s, A2C:    13.00 cm LV area s, A4C:    18.80 cm LV major d, A2C:   7.29 cm LV major d, A4C:   8.25 cm LV major s, A2C:   6.10 cm LV major s, A4C:   7.01 cm LV vol d, MOD A2C: 50.1 ml LV vol d, MOD A4C: 91.5 ml LV vol s, MOD A2C: 24.0 ml LV vol s, MOD A4C: 42.8 ml LV SV MOD A2C:     26.1 ml LV SV MOD A4C:     91.5 ml LV SV MOD BP:      37.7 ml RIGHT VENTRICLE RV S prime:     15.55 cm/s TAPSE (M-mode): 2.1 cm LEFT ATRIUM            Index LA diam:      3.80 cm  1.97 cm/m LA Vol (A2C): 117.0 ml 60.78 ml/m  AORTIC VALVE LVOT Vmax:   88.40 cm/s LVOT Vmean:  59.300 cm/s LVOT VTI:    0.172 m AI PHT:      526 msec  AORTA Ao Root diam: 3.10 cm MITRAL VALVE                        TRICUSPID VALVE MV Area (PHT): 3.77 cm             TR Peak grad:   23.8 mmHg MV PHT:        58.29 msec           TR Vmax:        244.00 cm/s MV Decel Time: 201 msec MV E velocity: 74.70 cm/s 103 cm/s  SHUNTS MV A velocity: 31.20 cm/s 70.3 cm/s Systemic VTI:  0.17 m MV E/A ratio:  2.39       1.5       Systemic Diam: 1.80 cm  Candee Furbish MD Electronically signed by Candee Furbish MD Signature Date/Time: 11/27/2019/12:15:46 PM    Final         Scheduled Meds: . aspirin  81 mg Oral Daily  . atorvastatin  80 mg Oral QHS  . furosemide  40 mg Oral BID  . heparin  5,000 Units Subcutaneous Q8H  . levothyroxine  100 mcg Oral Q0600  . metoprolol tartrate  25 mg Oral BID  . potassium chloride  40 mEq Oral BID  . ranolazine  1,000 mg Oral BID  . sodium chloride flush  3 mL Intravenous Q12H   Continuous Infusions: . sodium chloride       LOS: 1 day    Time spent: 25 minutes   Silvano Rusk, MD [facility] Pager [phone]  If 7PM-7AM, please contact night-coverage [email or web address] Password [facility] 11/28/2019, 11:33 AM

## 2019-11-28 NOTE — Progress Notes (Addendum)
Progress Note  Patient Name: Maria Fry Date of Encounter: 11/28/2019  Primary Cardiologist:   Kate Sable, MD   Subjective   She complains of headache but she thinks that her breathing is OK.  Denies chest pain.   Inpatient Medications    Scheduled Meds: . aspirin  81 mg Oral Daily  . atorvastatin  80 mg Oral QHS  . furosemide  40 mg Intravenous BID  . levothyroxine  100 mcg Oral Q0600  . metoprolol tartrate  25 mg Oral BID  . potassium chloride  40 mEq Oral BID  . ranolazine  1,000 mg Oral BID  . sodium chloride flush  3 mL Intravenous Q12H   Continuous Infusions: . sodium chloride    . heparin 1,050 Units/hr (11/28/19 0753)  . magnesium sulfate bolus IVPB 2 g (11/28/19 0900)   PRN Meds: sodium chloride, acetaminophen, nitroGLYCERIN, ondansetron (ZOFRAN) IV, sodium chloride flush   Vital Signs    Vitals:   11/27/19 1942 11/27/19 2201 11/28/19 0528 11/28/19 0801  BP: 132/67 (!) 115/49 119/64 129/81  Pulse: 73 72 74 72  Resp: 18  18 15   Temp: 97.6 F (36.4 C)  97.6 F (36.4 C) 98.3 F (36.8 C)  TempSrc: Oral  Oral Oral  SpO2: 92%  93% 97%  Weight:   96.5 kg   Height:        Intake/Output Summary (Last 24 hours) at 11/28/2019 0949 Last data filed at 11/28/2019 [id number] Gross per 24 hour  Intake 910.54 ml  Output 1250 ml  Net -339.46 ml   Filed Weights   11/26/19 2224 11/27/19 1500 11/28/19 0528  Weight: 97.5 kg 97.8 kg 96.5 kg    Telemetry    NSR - Personally Reviewed  ECG    N - Personally Reviewed  Physical Exam   GEN: No acute distress.   Neck: No  JVD Cardiac: RRR, no murmurs, rubs, or gallops.  Respiratory: Clear  to auscultation bilaterally. GI: Soft, nontender, non-distended  MS: No  edema; No deformity. Neuro:  Nonfocal  Psych: Normal affect   Labs    Chemistry Recent Labs  Lab 11/26/19 2325 11/28/19 0512  NA 139 137  K 4.2 3.3*  CL 107 102  CO2 26 27  GLUCOSE 78 103*  BUN 22 14  CREATININE 1.20* 0.97    CALCIUM 8.4* 8.3*  PROT 7.4 6.5  ALBUMIN 3.4* 2.9*  AST 20 20  ALT 19 17  ALKPHOS 85 71  BILITOT 0.5 0.5  GFRNONAA 47* >60  GFRAA 55* >60  ANIONGAP 6 8     Hematology Recent Labs  Lab 11/26/19 2325 11/28/19 0512  WBC 10.1 7.3  RBC 3.74* 3.45*  HGB 11.5* 10.6*  HCT 36.7 32.4*  MCV 98.1 93.9  MCH 30.7 30.7  MCHC 31.3 32.7  RDW 14.8 14.8  PLT 273 243    Cardiac EnzymesNo results for input(s): TROPONINI in the last 168 hours. No results for input(s): TROPIPOC in the last 168 hours.   BNP Recent Labs  Lab 11/26/19 2325  BNP 835.0*     DDimer No results for input(s): DDIMER in the last 168 hours.   Radiology    DG Chest [place] 1 View  Result Date: 11/26/2019 CLINICAL DATA:  68 year old female with shortness of breath. History of CHF and coronary artery disease. EXAM: PORTABLE CHEST 1 VIEW COMPARISON:  Chest radiograph dated 01/24/2019 FINDINGS: Bilateral pulmonary densities with predominant involvement of the lower lobes most likely represent pulmonary edema although pneumonia  or superimposed pneumonia on background of edema is not excluded. Clinical correlation is recommended. Small pleural effusions may be present. No pneumothorax. There is cardiomegaly. Atherosclerotic calcification of the aortic arch. No acute osseous pathology. IMPRESSION: Cardiomegaly with findings of CHF and pulmonary edema. Superimposed pneumonia is not excluded. Clinical correlation is recommended. Electronically Signed   By: Anner Crete M.D.   On: 11/26/2019 22:59   ECHOCARDIOGRAM COMPLETE  Result Date: 11/27/2019   ECHOCARDIOGRAM REPORT   Patient Name:   Maria Fry Date of Exam: 11/27/2019 Medical Rec #:  [id number]    Height:       60.0 in Accession #:    [id number]   Weight:       215.0 lb Date of Birth:  12-Jan-1951     BSA:          1.92 m Patient Age:    68 years     BP:           137/72 mmHg Patient Gender: F            HR:           67 bpm. Exam Location:  [facility] Procedure: 2D  Echo, Cardiac Doppler and Color Doppler Indications:    CHF-Acute Systolic 123456 / [id number]  History:        Patient has prior history of Echocardiogram examinations, most                 recent 08/06/2019. CHF, Previous Myocardial Infarction and CAD;                 Risk Factors:Hypertension and Dyslipidemia.  Sonographer:    Alvino Chapel RCS Referring Phys: [id number] OLADAPO ADEFESO IMPRESSIONS  1. Left ventricular ejection fraction, by visual estimation, is 45 to 50%. The left ventricle has mildly decreased function. There is moderately increased left ventricular hypertrophy.  2. Left ventricular diastolic parameters are consistent with Grade II diastolic dysfunction (pseudonormalization).  3. The left ventricle demonstrates global hypokinesis.  4. Global right ventricle has normal systolic function.The right ventricular size is normal. No increase in right ventricular wall thickness.  5. Left atrial size was severely dilated.  6. Right atrial size was normal.  7. The mitral valve is normal in structure. No evidence of mitral valve regurgitation. No evidence of mitral stenosis.  8. The tricuspid valve is normal in structure.  9. The aortic valve is normal in structure. Aortic valve regurgitation is trivial. No evidence of aortic valve sclerosis or stenosis. 10. The pulmonic valve was normal in structure. Pulmonic valve regurgitation is not visualized. 11. Normal pulmonary artery systolic pressure. 12. The inferior vena cava is normal in size with greater than 50% respiratory variability, suggesting right atrial pressure of 3 mmHg. 13. No significant change from prior study. FINDINGS  Left Ventricle: Left ventricular ejection fraction, by visual estimation, is 45 to 50%. The left ventricle has mildly decreased function. The left ventricle demonstrates global hypokinesis. There is moderately increased left ventricular hypertrophy. Left ventricular diastolic parameters are consistent with Grade II diastolic dysfunction  (pseudonormalization). Normal left atrial pressure. Right Ventricle: The right ventricular size is normal. No increase in right ventricular wall thickness. Global RV systolic function is has normal systolic function. The tricuspid regurgitant velocity is 2.44 m/s, and with an assumed right atrial pressure  of 3 mmHg, the estimated right ventricular systolic pressure is normal at 26.8 mmHg. Left Atrium: Left atrial size was severely dilated. Right Atrium: Right atrial size was  normal in size Pericardium: There is no evidence of pericardial effusion. Mitral Valve: The mitral valve is normal in structure. No evidence of mitral valve regurgitation. No evidence of mitral valve stenosis by observation. Tricuspid Valve: The tricuspid valve is normal in structure. Tricuspid valve regurgitation is trivial. Aortic Valve: The aortic valve is normal in structure. Aortic valve regurgitation is trivial. Aortic regurgitation PHT measures 526 msec. The aortic valve is structurally normal, with no evidence of sclerosis or stenosis. Pulmonic Valve: The pulmonic valve was normal in structure. Pulmonic valve regurgitation is not visualized. Pulmonic regurgitation is not visualized. Aorta: The aortic root, ascending aorta and aortic arch are all structurally normal, with no evidence of dilitation or obstruction. Venous: The inferior vena cava is normal in size with greater than 50% respiratory variability, suggesting right atrial pressure of 3 mmHg. IAS/Shunts: No atrial level shunt detected by color flow Doppler. There is no evidence of a patent foramen ovale. No ventricular septal defect is seen or detected. There is no evidence of an atrial septal defect.  LEFT VENTRICLE PLAX 2D LVIDd:         5.41 cm       Diastology LVIDs:         4.13 cm       LV e' lateral:   8.11 cm/s LV PW:         1.31 cm       LV E/e' lateral: 9.2 LV IVS:        1.86 cm       LV e' medial:    4.14 cm/s LVOT diam:     1.80 cm       LV E/e' medial:  18.0 LV SV:          66 ml LV SV Index:   31.78 LVOT Area:     2.54 cm  LV Volumes (MOD) LV area d, A2C:    20.80 cm LV area d, A4C:    30.30 cm LV area s, A2C:    13.00 cm LV area s, A4C:    18.80 cm LV major d, A2C:   7.29 cm LV major d, A4C:   8.25 cm LV major s, A2C:   6.10 cm LV major s, A4C:   7.01 cm LV vol d, MOD A2C: 50.1 ml LV vol d, MOD A4C: 91.5 ml LV vol s, MOD A2C: 24.0 ml LV vol s, MOD A4C: 42.8 ml LV SV MOD A2C:     26.1 ml LV SV MOD A4C:     91.5 ml LV SV MOD BP:      37.7 ml RIGHT VENTRICLE RV S prime:     15.55 cm/s TAPSE (M-mode): 2.1 cm LEFT ATRIUM            Index LA diam:      3.80 cm  1.97 cm/m LA Vol (A2C): 117.0 ml 60.78 ml/m  AORTIC VALVE LVOT Vmax:   88.40 cm/s LVOT Vmean:  59.300 cm/s LVOT VTI:    0.172 m AI PHT:      526 msec  AORTA Ao Root diam: 3.10 cm MITRAL VALVE                        TRICUSPID VALVE MV Area (PHT): 3.77 cm             TR Peak grad:   23.8 mmHg MV PHT:        58.29 msec  TR Vmax:        244.00 cm/s MV Decel Time: 201 msec MV E velocity: 74.70 cm/s 103 cm/s  SHUNTS MV A velocity: 31.20 cm/s 70.3 cm/s Systemic VTI:  0.17 m MV E/A ratio:  2.39       1.5       Systemic Diam: 1.80 cm  Candee Furbish MD Electronically signed by Candee Furbish MD Signature Date/Time: 11/27/2019/12:15:46 PM    Final     Cardiac Studies   ECHO:    1. Left ventricular ejection fraction, by visual estimation, is 45 to 50%. The left ventricle has mildly decreased function. There is moderately increased left ventricular hypertrophy.  2. Left ventricular diastolic parameters are consistent with Grade II diastolic dysfunction (pseudonormalization).  3. The left ventricle demonstrates global hypokinesis.  4. Global right ventricle has normal systolic function.The right ventricular size is normal. No increase in right ventricular wall thickness.  5. Left atrial size was severely dilated.  6. Right atrial size was normal.  7. The mitral valve is normal in structure. No evidence of mitral  valve regurgitation. No evidence of mitral stenosis.  8. The tricuspid valve is normal in structure.  9. The aortic valve is normal in structure. Aortic valve regurgitation is trivial. No evidence of aortic valve sclerosis or stenosis. 10. The pulmonic valve was normal in structure. Pulmonic valve regurgitation is not visualized. 11. Normal pulmonary artery systolic pressure. 12. The inferior vena cava is normal in size with greater than 50% respiratory variability, suggesting right atrial pressure of 3 mmHg. 13. No significant change from prior study.  Patient Profile     68 y.o. female with a history of CAD s/p NSTEMI in 2016 w/ known CTO of the dLAD, chronic combined syst/diast CHF (EF 45%), HTN, HL, hypothyroidism, and med noncompliance, who is being seen for the evaluation of dyspnea/CHF at the request of Dr. Posey Pronto.  Assessment & Plan    ACUTE ON CHRONIC COMBINED SYSTOLIC AND DIASTOLIC HF:  Net negative only 282 but not sure that I/O is complete.   However, I don't think she has significant volume overload.  I have changed to PO Lasix.   HTN:  BP well controlled.    CAD:  hsTrop mild trend up.    EF unchanged from previous.  I will order a Lexiscan Myoview for tomorrow given the enzyme elevation.   OK to discontinue heparin.    For questions or updates, please contact [place] Please consult [email or web address] for contact info under Cardiology/STEMI.   Signed, Minus Breeding, MD  11/28/2019, 9:49 AM

## 2019-11-28 NOTE — Progress Notes (Signed)
[place] for Heparin Indication: chest pain/ACS  Allergies  Allergen Reactions  . Vicodin [Hydrocodone-Acetaminophen] Other (See Comments)    Severe nausea and diarrhea   . Amoxicillin     edema  . Demerol [Meperidine] Nausea And Vomiting  . Lisinopril Other (See Comments)    Throat swollen  . Lodine [Etodolac] Rash    Patient Measurements: Height: 5' (152.4 cm) Weight: 212 lb 12.8 oz (96.5 kg) IBW/kg (Calculated) : 45.5 Heparin Dosing Weight: 69 kg  Vital Signs: Temp: 97.6 F (36.4 C) (12/27 0528) Temp Source: Oral (12/27 0528) BP: 119/64 (12/27 0528) Pulse Rate: 74 (12/27 0528)  Labs: Recent Labs    11/26/19 2325 11/27/19 0135 11/27/19 0948 11/27/19 1802 11/27/19 1907 11/28/19 0512  HGB 11.5*  --   --   --   --  10.6*  HCT 36.7  --   --   --   --  32.4*  PLT 273  --   --   --   --  243  HEPARINUNFRC  --   --  0.50  --  0.32 0.31  CREATININE 1.20*  --   --   --   --   --   TROPONINIHS 69* 202*  --  341*  --   --     Estimated Creatinine Clearance: 48 mL/min (A) (by C-G formula based on SCr of 1.2 mg/dL (H)).   Medical History: Past Medical History:  Diagnosis Date  . CAD (coronary artery disease)    a. 06/07/15 NSTEMI/Cath: Dist LAD 100%, mid LAD 10%. Otw nl cors. Nl EF w/ inferoapical AK.  Marland Kitchen Chronic combined systolic (congestive) and diastolic (congestive) heart failure ([place]) 04/23/2016   a. 04/2016 Echo: EF 45%, Gr2 DD; b. 11/2019 Echo: EF 45-50%, mod LVH. Gr2 DD. Nl RV fxn. Sev dil LA.   . Essential hypertension   . Hip pain   . Hyperlipidemia   . Hypothyroidism   . MI (myocardial infarction) ([place]) 06/05/2015  . Vitamin D deficiency     Assessment: 68 y.o. F presents with CP. To begin heparin for ACS. Trop 202. No AC PTA. Hgb decreased from 11.5 to 10.6, plt wnl at baseline.  Heparin level remains therapeutic at 0.31, on  units/hr. No s/sx of bleeding or infusion issues.  Goal of Therapy:  Heparin level  0.3-0.7 units/ml Monitor platelets by anticoagulation protocol: Yes   Plan:  Increase heparin gtt to 1050 units/hr to keep in goal range Daily heparin level and CBC  Sherren Kerns, PharmD PGY1 [place] Resident Please check AMION for all [place] phone numbers After 10:00 PM, call [place] [phone] 11/28/2019,7:13 AM

## 2019-11-29 ENCOUNTER — Inpatient Hospital Stay (HOSPITAL_COMMUNITY): Payer: PPO

## 2019-11-29 DIAGNOSIS — R079 Chest pain, unspecified: Secondary | ICD-10-CM

## 2019-11-29 DIAGNOSIS — E782 Mixed hyperlipidemia: Secondary | ICD-10-CM

## 2019-11-29 DIAGNOSIS — I2510 Atherosclerotic heart disease of native coronary artery without angina pectoris: Secondary | ICD-10-CM

## 2019-11-29 DIAGNOSIS — E876 Hypokalemia: Secondary | ICD-10-CM

## 2019-11-29 DIAGNOSIS — Z9119 Patient's noncompliance with other medical treatment and regimen: Secondary | ICD-10-CM

## 2019-11-29 LAB — COMPREHENSIVE METABOLIC PANEL
ALT: 15 U/L (ref 0–44)
AST: 18 U/L (ref 15–41)
Albumin: 2.9 g/dL — ABNORMAL LOW (ref 3.5–5.0)
Alkaline Phosphatase: 70 U/L (ref 38–126)
Anion gap: 7 (ref 5–15)
BUN: 14 mg/dL (ref 8–23)
CO2: 30 mmol/L (ref 22–32)
Calcium: 8.4 mg/dL — ABNORMAL LOW (ref 8.9–10.3)
Chloride: 101 mmol/L (ref 98–111)
Creatinine, Ser: 1.16 mg/dL — ABNORMAL HIGH (ref 0.44–1.00)
GFR calc Af Amer: 57 mL/min — ABNORMAL LOW (ref >60)
GFR calc non Af Amer: 49 mL/min — ABNORMAL LOW (ref >60)
Glucose, Bld: 103 mg/dL — ABNORMAL HIGH (ref 70–99)
Potassium: 4 mmol/L (ref 3.5–5.1)
Sodium: 138 mmol/L (ref 135–145)
Total Bilirubin: 0.4 mg/dL (ref 0.3–1.2)
Total Protein: 6.8 g/dL (ref 6.5–8.1)

## 2019-11-29 LAB — IRON AND TIBC
Iron: 38 ug/dL (ref 28–170)
Saturation Ratios: 12 % (ref 10.4–31.8)
TIBC: 318 ug/dL (ref 250–450)
UIBC: 280 ug/dL

## 2019-11-29 LAB — CBC
HCT: 32.3 % — ABNORMAL LOW (ref 36.0–46.0)
Hemoglobin: 10.8 g/dL — ABNORMAL LOW (ref 12.0–15.0)
MCH: 30.9 pg (ref 26.0–34.0)
MCHC: 33.4 g/dL (ref 30.0–36.0)
MCV: 92.6 fL (ref 80.0–100.0)
Platelets: 243 10*3/uL (ref 150–400)
RBC: 3.49 MIL/uL — ABNORMAL LOW (ref 3.87–5.11)
RDW: 14.7 % (ref 11.5–15.5)
WBC: 7.4 10*3/uL (ref 4.0–10.5)
nRBC: 0 % (ref 0.0–0.2)

## 2019-11-29 LAB — MAGNESIUM: Magnesium: 2.1 mg/dL (ref 1.7–2.4)

## 2019-11-29 LAB — NM MYOCAR MULTI W/SPECT W/WALL MOTION / EF
Peak HR: 80 {beats}/min
Rest HR: 60 {beats}/min

## 2019-11-29 LAB — FERRITIN: Ferritin: 16 ng/mL (ref 11–307)

## 2019-11-29 LAB — VITAMIN B12: Vitamin B-12: 269 pg/mL (ref 180–914)

## 2019-11-29 LAB — HEPARIN LEVEL (UNFRACTIONATED): Heparin Unfractionated: <0.1 IU/mL — ABNORMAL LOW (ref 0.30–0.70)

## 2019-11-29 LAB — PHOSPHORUS: Phosphorus: 2.9 mg/dL (ref 2.5–4.6)

## 2019-11-29 MED ORDER — TECHNETIUM TC 99M TETROFOSMIN IV KIT
30.0000 | PACK | Freq: Once | INTRAVENOUS | Status: AC | PRN
  Administered 2019-11-29: 30 via INTRAVENOUS

## 2019-11-29 MED ORDER — TECHNETIUM TC 99M TETROFOSMIN IV KIT
10.0000 | PACK | Freq: Once | INTRAVENOUS | Status: AC | PRN
  Administered 2019-11-29: 10 via INTRAVENOUS

## 2019-11-29 MED ORDER — REGADENOSON 0.4 MG/5ML IV SOLN
INTRAVENOUS | Status: AC
  Filled 2019-11-29: qty 5

## 2019-11-29 MED ORDER — VITAMIN B-12 1000 MCG PO TABS
500.0000 ug | ORAL_TABLET | Freq: Every day | ORAL | Status: DC
  Administered 2019-11-29 – 2019-11-30 (×2): 500 ug via ORAL
  Filled 2019-11-29 (×3): qty 1

## 2019-11-29 MED ORDER — REGADENOSON 0.4 MG/5ML IV SOLN
0.4000 mg | Freq: Once | INTRAVENOUS | Status: AC
  Administered 2019-11-29: 0.4 mg via INTRAVENOUS
  Filled 2019-11-29: qty 5

## 2019-11-29 NOTE — Progress Notes (Addendum)
PROGRESS NOTE    Maria Fry  [id number] DOB: 1951-09-29 DOA: 11/26/2019 PCP: Alycia Rossetti, MD   Brief Narrative: 68 year old female with history of CAD/NSTEMI, CHF presented to [facility] 12/26 with shortness of breath, elevated troponin and was transferred to [facility] for evaluation.  Subjective: Seen this morning denies any nausea vomiting chest pain.  She had just come from her stress test.  Assessment & Plan:  Acute on chronic combined systolic and diastolic CHF: Symptoms nicely improved with good diuresis -1.3 L since admission.  Ms. Benjamine Mola milligrams Lasix and 40 mg subsequently transitioned to 40 twice daily.  Per cardiology upon discharge continue home dose of 40 mg daily for days times a week Tuesday Thursday Saturday and Sunday and 3 days times week Monday Wednesday and Friday 40 mg twice a day or 80 mg morning  Acute hypoxic respiratory failure on presentation needing nonrebreather.  Subsequently oxygen weaned down and currently on room air.  Hypertension blood pressure fairly controlled continue beta-blockers, diuretics.  CAD with demand ischemia elevated troponin.  History of non-ST elevation MI July/2016 with occlusion of the distal LAD, medically managed with aspirin statin beta-blocker and Ranexa.  Elevated troponin likely from CHF,  Trop 69->202->341.  Stress test abnormal today, in the absence of an active angina symptom cardiology advised no further recommendation to continue on current medical therapy  Hyperlipidemia continue statin.  Acute renal failure creatinine 1.2 on admission improved 0.9 and at 1.16 today.  Repeat BMP in the morning.  Noncompliance patient not using her Lasix, she has been counseled.  Hypokalemia: Improved to 4.0.  We will continue on.  Hypothyroidism: Continue home Synthroid.  Anemia likely from chronic Disease: Ferritin and iron panel are normal.  B12 borderline to 69 we will add on B12 oral supplementation  Morbid  Obesity with BMI Body mass index is 41.31 kg/m.  Will need intensive follow-up with PCP for weight loss and healthy lifestyle management.   DVT prophylaxis: Heparin Code Status: Full code Family Communication: plan of care discussed with patient at bedside. Disposition Plan: Remains inpatient pending clinical improvement, anticipate discharge tomorrow.  Cardiology advised if potassium is stable and if patient does well with ambulation tomorrow morning she can be discharged.    Consultants: cardiology Procedures: Echocardiogram 11/27/19: EF 45-50%.  Mildly decreased EF with global HK.  Moderate LVH.  GRII DD.  Severe LA dilation with normal RA.  Relatively normal valves.  RA pressure estimated 3 mmHg.  Myoview Stress Test 11/29/2019 (films personally reviewed): EF estimated 40 to 45%.  Small size scar in the apical and inferior apical wall with mild peri-infarct ischemia. -->  Read as intermediate risk,   On m (cardiologist) y review, not unexpected given known coronary anatomy with occluded apical LAD  Microbiology:none  Antimicrobials: Anti-infectives (From admission, onward)   None       Objective: Vitals:   11/29/19 0925 11/29/19 0927 11/29/19 1021 11/29/19 1146  BP: (!) 158/104 (!) 158/97 136/77 (!) 145/92  Pulse:   73 67  Resp:   17 15  Temp:   98.7 F (37.1 C) 98.7 F (37.1 C)  TempSrc:   Oral Oral  SpO2:   96% 98%  Weight:      Height:        Intake/Output Summary (Last 24 hours) at 11/29/2019 1521 Last data filed at 11/29/2019 1409 Gross per 24 hour  Intake 883 ml  Output 2000 ml  Net -1117 ml   [employer]  11/27/19 1500 11/28/19 0528 11/29/19 0513  Weight: 97.8 kg 96.5 kg 95.9 kg   Weight change: -1.814 kg  Body mass index is 41.31 kg/m.  Intake/Output from previous day: 12/27 0701 - 12/28 0700 In: 1340.1 [P.O.:1260; I.V.:80.1] Out: 2350 [Urine:2350] Intake/Output this shift: Total I/O In: 423 [P.O.:420; I.V.:3] Out: 850  [Urine:850]  Examination:  General exam: AAOx3, obese NAD HEENT:Oral mucosa moist, Ear/Nose WNL grossly, dentition normal. Respiratory system: Diminished at the base,no wheezing or crackles,no use of accessory muscle Cardiovascular system: S1 & S2 +, No JVD,. Gastrointestinal system: Abdomen soft, NT,ND, BS+ Nervous System:Alert, awake, moving extremities and grossly nonfocal Extremities: No edema, distal peripheral pulses palpable.  Skin: No rashes,no icterus. MSK: Normal muscle bulk,tone, power  Medications:  Scheduled Meds: . aspirin  81 mg Oral Daily  . atorvastatin  80 mg Oral QHS  . furosemide  40 mg Oral BID  . heparin  5,000 Units Subcutaneous Q8H  . levothyroxine  100 mcg Oral Q0600  . metoprolol tartrate  25 mg Oral BID  . potassium chloride  40 mEq Oral BID  . ranolazine  1,000 mg Oral BID  . regadenoson      . sodium chloride flush  3 mL Intravenous Q12H   Continuous Infusions: . sodium chloride      Data Reviewed: I have personally reviewed following labs and imaging studies  CBC: Recent Labs  Lab 11/26/19 2325 11/28/19 0512 11/29/19 0507  WBC 10.1 7.3 7.4  HGB 11.5* 10.6* 10.8*  HCT 36.7 32.4* 32.3*  MCV 98.1 93.9 92.6  PLT 273 243 0000000   Basic Metabolic Panel: Recent Labs  Lab 11/26/19 2325 11/28/19 0512 11/29/19 0507  NA 139 137 138  K 4.2 3.3* 4.0  CL 107 102 101  CO2 26 27 30   GLUCOSE 78 103* 103*  BUN 22 14 14   CREATININE 1.20* 0.97 1.16*  CALCIUM 8.4* 8.3* 8.4*  MG  --  1.9 2.1  PHOS  --  2.7 2.9   GFR: Estimated Creatinine Clearance: 49.5 mL/min (A) (by C-G formula based on SCr of 1.16 mg/dL (H)). Liver Function Tests: Recent Labs  Lab 11/26/19 2325 11/28/19 0512 11/29/19 0507  AST 20 20 18   ALT 19 17 15   ALKPHOS 85 71 70  BILITOT 0.5 0.5 0.4  PROT 7.4 6.5 6.8  ALBUMIN 3.4* 2.9* 2.9*   No results for input(s): LIPASE, AMYLASE in the last 168 hours. No results for input(s): AMMONIA in the last 168 hours. Coagulation  Profile: No results for input(s): INR, PROTIME in the last 168 hours. Cardiac Enzymes: No results for input(s): CKTOTAL, CKMB, CKMBINDEX, TROPONINI in the last 168 hours. BNP (last 3 results) No results for input(s): PROBNP in the last 8760 hours. HbA1C: No results for input(s): HGBA1C in the last 72 hours. CBG: No results for input(s): GLUCAP in the last 168 hours. Lipid Profile: No results for input(s): CHOL, HDL, LDLCALC, TRIG, CHOLHDL, LDLDIRECT in the last 72 hours. Thyroid Function Tests: No results for input(s): TSH, T4TOTAL, FREET4, T3FREE, THYROIDAB in the last 72 hours. Anemia Panel: Recent Labs    11/29/19 0507  VITAMINB12 269  FERRITIN 16  TIBC 318  IRON 38   Sepsis Labs: Recent Labs  Lab 11/27/19 0135  PROCALCITON <0.10    Recent Results (from the past 240 hour(s))  SARS CORONAVIRUS 2 (TAT 6-24 HRS) Nasopharyngeal Nasopharyngeal Swab     Status: None   Collection Time: 11/27/19  2:16 AM   Specimen: Nasopharyngeal Swab  Result  Value Ref Range Status   SARS Coronavirus 2 NEGATIVE NEGATIVE Final    Comment: (NOTE) SARS-CoV-2 target nucleic acids are NOT DETECTED. The SARS-CoV-2 RNA is generally detectable in upper and lower respiratory specimens during the acute phase of infection. Negative results do not preclude SARS-CoV-2 infection, do not rule out co-infections with other pathogens, and should not be used as the sole basis for treatment or other patient management decisions. Negative results must be combined with clinical observations, patient history, and epidemiological information. The expected result is Negative. Fact Sheet for Patients: SugarRoll.be Fact Sheet for Healthcare Providers: https://www.woods-mathews.com/ This test is not yet approved or cleared by the [place] FDA and  has been authorized for detection and/or diagnosis of SARS-CoV-2 by FDA under an Emergency Use Authorization (EUA). This  EUA will remain  in effect (meaning this test can be used) for the duration of the COVID-19 declaration under Section 56 4(b)(1) of the Act, 21 U.S.C. section 360bbb-3(b)(1), unless the authorization is terminated or revoked sooner. Performed at [facility], [place] [address], [place], [place] [address]       Radiology Studies: DG Chest 2 View  Result Date: 11/28/2019 CLINICAL DATA:  Coronary artery disease, NSTEMI, CHF, hypertension EXAM: CHEST - 2 VIEW COMPARISON:  11/26/2019 FINDINGS: Enlargement of cardiac silhouette. Mediastinal contours and pulmonary vascularity normal. Improved pulmonary infiltrates since previous exam. No pleural effusion or pneumothorax. IMPRESSION: Improved pulmonary infiltrates. Enlargement of cardiac silhouette. Electronically Signed   By: Lavonia Dana M.D.   On: 11/28/2019 13:52   NM Myocar Multi W/Spect Tamela Oddi Motion / EF  Result Date: 11/29/2019 CLINICAL DATA:  69 year old female with chest pain. EXAM: MYOCARDIAL IMAGING WITH SPECT (REST AND PHARMACOLOGIC-STRESS) GATED LEFT VENTRICULAR WALL MOTION STUDY LEFT VENTRICULAR EJECTION FRACTION TECHNIQUE: Standard myocardial SPECT imaging was performed after resting intravenous injection of 10 mCi Tc-54m tetrofosmin. Subsequently, intravenous infusion of Lexiscan was performed under the supervision of the Cardiology staff. At peak effect of the drug, 30 mCi Tc-77m tetrofosmin was injected intravenously and standard myocardial SPECT imaging was performed. Quantitative gated imaging was also performed to evaluate left ventricular wall motion, and estimate left ventricular ejection fraction. COMPARISON:  None. FINDINGS: Perfusion: There is a small region of moderate decreased counts in the apical segment of the inferior wall at which perfusion improved from rest to stress. No additional evidence of ischemia or infarction. Wall Motion: Mild LEFT ventricular dilatation.  Global hypokinesia. Left Ventricular Ejection  Fraction: 41 % End diastolic volume 91 ml End systolic volume 65 ml IMPRESSION: 1. Small scar in the apical segment of the inferior wall with peri-infarct ischemia. 2. Global hypokinesia and mild LEFT ventricular dilatation. 3. Left ventricular ejection fraction 41% 4. Non invasive risk stratification*: Intermediate *2012 Appropriate Use Criteria for Coronary Revascularization Focused Update: J Am Coll Cardiol. [id number]. http://content.airportbarriers.com.aspx?articleid=[email or web address] Electronically Signed   By: Suzy Bouchard M.D.   On: 11/29/2019 13:03      LOS: 2 days   Time spent: More than 50% of that time was spent in counseling and/or coordination of care.  Antonieta Pert, MD [facility]  11/29/2019, 3:21 PM

## 2019-11-29 NOTE — Progress Notes (Addendum)
Progress Note  Patient Name: Maria Fry Date of Encounter: 11/29/2019  Primary Cardiologist: Kate Sable, MD   Subjective   Pt seen during stress test. She denies chest pain or shortness of breath. She has some mid back pain, thinks related to the bed.   Inpatient Medications    Scheduled Meds: . aspirin  81 mg Oral Daily  . atorvastatin  80 mg Oral QHS  . furosemide  40 mg Oral BID  . heparin  5,000 Units Subcutaneous Q8H  . levothyroxine  100 mcg Oral Q0600  . metoprolol tartrate  25 mg Oral BID  . potassium chloride  40 mEq Oral BID  . ranolazine  1,000 mg Oral BID  . regadenoson      . sodium chloride flush  3 mL Intravenous Q12H   Continuous Infusions: . sodium chloride     PRN Meds: sodium chloride, acetaminophen, nitroGLYCERIN, ondansetron (ZOFRAN) IV, sodium chloride flush, traMADol   Vital Signs    Vitals:   11/29/19 0913 11/29/19 0924 11/29/19 0925 11/29/19 0927  BP: (!) 165/101 (!) 157/91 (!) 158/104 (!) 158/97  Pulse:      Resp:      Temp:      TempSrc:      SpO2:      Weight:      Height:        Intake/Output Summary (Last 24 hours) at 11/29/2019 0929 Last data filed at 11/29/2019 0806 Gross per 24 hour  Intake 897.13 ml  Output 2100 ml  Net -1202.87 ml   Last 3 Weights 11/29/2019 11/28/2019 11/27/2019  Weight (lbs) 211 lb 8 oz 212 lb 12.8 oz 215 lb 8 oz  Weight (kg) 95.936 kg 96.525 kg 97.75 kg      Telemetry    Sinus rhythm with PVCs - Personally Reviewed  ECG    No EKG  Physical Exam   GEN: No acute distress.   Neck: No JVD Cardiac: RRR, no murmurs, rubs, or gallops.  Respiratory: Clear to auscultation bilaterally. GI: Soft, nontender, non-distended  MS: No edema; No deformity. Neuro:  Nonfocal  Psych: Normal affect   Labs    High Sensitivity Troponin:   Recent Labs  Lab 11/26/19 2325 11/27/19 0135 11/27/19 1802  TROPONINIHS 69* 202* 341*      Chemistry Recent Labs  Lab 11/26/19 2325 11/28/19 0512  11/29/19 0507  NA 139 137 138  K 4.2 3.3* 4.0  CL 107 102 101  CO2 26 27 30   GLUCOSE 78 103* 103*  BUN 22 14 14   CREATININE 1.20* 0.97 1.16*  CALCIUM 8.4* 8.3* 8.4*  PROT 7.4 6.5 6.8  ALBUMIN 3.4* 2.9* 2.9*  AST 20 20 18   ALT 19 17 15   ALKPHOS 85 71 70  BILITOT 0.5 0.5 0.4  GFRNONAA 47* >60 49*  GFRAA 55* >60 57*  ANIONGAP 6 8 7      Hematology Recent Labs  Lab 11/26/19 2325 11/28/19 0512 11/29/19 0507  WBC 10.1 7.3 7.4  RBC 3.74* 3.45* 3.49*  HGB 11.5* 10.6* 10.8*  HCT 36.7 32.4* 32.3*  MCV 98.1 93.9 92.6  MCH 30.7 30.7 30.9  MCHC 31.3 32.7 33.4  RDW 14.8 14.8 14.7  PLT 273 243 243    BNP Recent Labs  Lab 11/26/19 2325  BNP 835.0*     DDimer No results for input(s): DDIMER in the last 168 hours.   Radiology    DG Chest 2 View  Result Date: 11/28/2019 CLINICAL DATA:  Coronary artery  disease, NSTEMI, CHF, hypertension EXAM: CHEST - 2 VIEW COMPARISON:  11/26/2019 FINDINGS: Enlargement of cardiac silhouette. Mediastinal contours and pulmonary vascularity normal. Improved pulmonary infiltrates since previous exam. No pleural effusion or pneumothorax. IMPRESSION: Improved pulmonary infiltrates. Enlargement of cardiac silhouette. Electronically Signed   By: Lavonia Dana M.D.   On: 11/28/2019 13:52   ECHOCARDIOGRAM COMPLETE  Result Date: 11/27/2019   ECHOCARDIOGRAM REPORT   Patient Name:   Maria Fry Date of Exam: 11/27/2019 Medical Rec #:  [id number]    Height:       60.0 in Accession #:    [id number]   Weight:       215.0 lb Date of Birth:  [birth year]     BSA:          1.92 m Patient Age:    68 years     BP:           137/72 mmHg Patient Gender: F            HR:           67 bpm. Exam Location:  [facility] Procedure: 2D Echo, Cardiac Doppler and Color Doppler Indications:    CHF-Acute Systolic [id number] / [id number]  History:        Patient has prior history of Echocardiogram examinations, most                 recent 08/06/2019. CHF, Previous Myocardial Infarction and CAD;                  Risk Factors:Hypertension and Dyslipidemia.  Sonographer:    Alvino Chapel RCS Referring Phys: [id number] OLADAPO ADEFESO IMPRESSIONS  1. Left ventricular ejection fraction, by visual estimation, is 45 to 50%. The left ventricle has mildly decreased function. There is moderately increased left ventricular hypertrophy.  2. Left ventricular diastolic parameters are consistent with Grade II diastolic dysfunction (pseudonormalization).  3. The left ventricle demonstrates global hypokinesis.  4. Global right ventricle has normal systolic function.The right ventricular size is normal. No increase in right ventricular wall thickness.  5. Left atrial size was severely dilated.  6. Right atrial size was normal.  7. The mitral valve is normal in structure. No evidence of mitral valve regurgitation. No evidence of mitral stenosis.  8. The tricuspid valve is normal in structure.  9. The aortic valve is normal in structure. Aortic valve regurgitation is trivial. No evidence of aortic valve sclerosis or stenosis. 10. The pulmonic valve was normal in structure. Pulmonic valve regurgitation is not visualized. 11. Normal pulmonary artery systolic pressure. 12. The inferior vena cava is normal in size with greater than 50% respiratory variability, suggesting right atrial pressure of 3 mmHg. 13. No significant change from prior study. FINDINGS  Left Ventricle: Left ventricular ejection fraction, by visual estimation, is 45 to 50%. The left ventricle has mildly decreased function. The left ventricle demonstrates global hypokinesis. There is moderately increased left ventricular hypertrophy. Left ventricular diastolic parameters are consistent with Grade II diastolic dysfunction (pseudonormalization). Normal left atrial pressure. Right Ventricle: The right ventricular size is normal. No increase in right ventricular wall thickness. Global RV systolic function is has normal systolic function. The tricuspid regurgitant velocity  is 2.44 m/s, and with an assumed right atrial pressure  of 3 mmHg, the estimated right ventricular systolic pressure is normal at 26.8 mmHg. Left Atrium: Left atrial size was severely dilated. Right Atrium: Right atrial size was normal in size Pericardium: There is no evidence of pericardial effusion.  Mitral Valve: The mitral valve is normal in structure. No evidence of mitral valve regurgitation. No evidence of mitral valve stenosis by observation. Tricuspid Valve: The tricuspid valve is normal in structure. Tricuspid valve regurgitation is trivial. Aortic Valve: The aortic valve is normal in structure. Aortic valve regurgitation is trivial. Aortic regurgitation PHT measures 526 msec. The aortic valve is structurally normal, with no evidence of sclerosis or stenosis. Pulmonic Valve: The pulmonic valve was normal in structure. Pulmonic valve regurgitation is not visualized. Pulmonic regurgitation is not visualized. Aorta: The aortic root, ascending aorta and aortic arch are all structurally normal, with no evidence of dilitation or obstruction. Venous: The inferior vena cava is normal in size with greater than 50% respiratory variability, suggesting right atrial pressure of 3 mmHg. IAS/Shunts: No atrial level shunt detected by color flow Doppler. There is no evidence of a patent foramen ovale. No ventricular septal defect is seen or detected. There is no evidence of an atrial septal defect.  LEFT VENTRICLE PLAX 2D LVIDd:         5.41 cm       Diastology LVIDs:         4.13 cm       LV e' lateral:   8.11 cm/s LV PW:         1.31 cm       LV E/e' lateral: 9.2 LV IVS:        1.86 cm       LV e' medial:    4.14 cm/s LVOT diam:     1.80 cm       LV E/e' medial:  18.0 LV SV:         66 ml LV SV Index:   31.78 LVOT Area:     2.54 cm  LV Volumes (MOD) LV area d, A2C:    20.80 cm LV area d, A4C:    30.30 cm LV area s, A2C:    13.00 cm LV area s, A4C:    18.80 cm LV major d, A2C:   7.29 cm LV major d, A4C:   8.25 cm LV  major s, A2C:   6.10 cm LV major s, A4C:   7.01 cm LV vol d, MOD A2C: 50.1 ml LV vol d, MOD A4C: 91.5 ml LV vol s, MOD A2C: 24.0 ml LV vol s, MOD A4C: 42.8 ml LV SV MOD A2C:     26.1 ml LV SV MOD A4C:     91.5 ml LV SV MOD BP:      37.7 ml RIGHT VENTRICLE RV S prime:     15.55 cm/s TAPSE (M-mode): 2.1 cm LEFT ATRIUM            Index LA diam:      3.80 cm  1.97 cm/m LA Vol (A2C): 117.0 ml 60.78 ml/m  AORTIC VALVE LVOT Vmax:   88.40 cm/s LVOT Vmean:  59.300 cm/s LVOT VTI:    0.172 m AI PHT:      526 msec  AORTA Ao Root diam: 3.10 cm MITRAL VALVE                        TRICUSPID VALVE MV Area (PHT): 3.77 cm             TR Peak grad:   23.8 mmHg MV PHT:        58.29 msec           TR Vmax:  244.00 cm/s MV Decel Time: 201 msec MV E velocity: 74.70 cm/s 103 cm/s  SHUNTS MV A velocity: 31.20 cm/s 70.3 cm/s Systemic VTI:  0.17 m MV E/A ratio:  2.39       1.5       Systemic Diam: 1.80 cm  Candee Furbish MD Electronically signed by Candee Furbish MD Signature Date/Time: 11/27/2019/12:15:46 PM    Final     Cardiac Studies   Echocardiogram 11/27/19: EF 45-50%.  Mildly decreased EF with global HK.  Moderate LVH.  GRII DD.  Severe LA dilation with normal RA.  Relatively normal valves.  RA pressure estimated 3 mmHg.  Myoview Stress Test 11/29/2019 (films personally reviewed): EF estimated 40 to 45%.  Small size scar in the apical and inferior apical wall with mild peri-infarct ischemia. -->  Read as intermediate risk,   On m (cardiologist) y review, not unexpected given known coronary anatomy with occluded apical LAD.   Patient Profile     68 y.o. 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 BNP 835  Assessment & Plan    1. Acute on chronic combined systolic and diastolic heart failure -Diuresed with lasix 80 mg then 40 mg, transitioned to  oral lasix 40 mg BID yesterday with good response. 2.3L UOP yesterday. Net negative 1.3L since admission. -Pt does not appear significantly volume overloaded.  --> For discharge purposes, would probably have her go back to her baseline 40 mg daily 4 days/week (Tuesday, Thursday, Saturday, Sunday), 3 days/week (Monday, Wednesday and Friday) 40 mg twice daily or 80 mg every morning --> Would also recommend taking double her home dose of potassium supplement when taking double dose of Lasix.  2. Essential Hypertension -Continues on beta blocker and diuretics.  -BP has been well controlled. BP up during stress test as she had stomach upset with the lexiscan.   3. CAD/demand ischemia  -S/P NSTEMI in 06/2015 w/ occlusion of the dLAD, which has been medically managed since w/ asa, statin, ? blocker, and ranexa.  She has not been having c/p.  In the setting of CHF exacerbation, HsTrop is elevated @ 69> 202>341  Myoview personally reviewed.  There is clearly an inferoapical fixed perfusion defect with mild reversibility.  This is pretty much consistent with known apical infarct and occluded apical LAD.  Not unexpected.  Suspect that troponin elevation is related to demand ischemia in the apical region..  In the absence of any active angina symptoms (she denied any overt chest tightness/pressure), would not recommend relook catheterization.  Continue current medications.  4.  HL:  Cont statin rx.  Followed by pcp as outpt.  5.  AKI:  Creat 1.2 on admission.  Improved to 0.97, then 1.16 today.   6.  Noncompliance:  She is very upfront about not using lasix as Rx.  Strongly encouraged compliance.    She did tell me that she has been taking it 40 mg daily.  Would also recommend support stockings.   For questions or updates, please contact [place] Please consult [email or web address] for contact info under       Signed, Daune Perch, NP  11/29/2019, 9:29 AM    ATTENDING ATTESTATION  I have  seen, examined and evaluated the patient this PM along with Ms. Phylliss Bob, NP-C.  After reviewing all the available data and chart,  we discussed the patients laboratory, study & physical findings as well as symptoms in detail. I agree with her findings, examination as well as impression recommendations as per our discussion.    Attending adjustments noted in italics.   Ms. Hosea seems doing fairly well now.  No active dyspnea or angina symptoms at this point.  She does not have any angina on presentation and therefore I suspect that this is more of a demand ischemia with acute CHF exacerbation than true ACS.  Blood pressure up until today has been relatively well controlled.  Would continue her home medications for now with the exception of change to Lasix noted above in italics  Would be otherwise okay to discharge home tomorrow morning to allow assessment of urine output on twice daily oral Lasix, and follow blood pressure.   [facility] will sign off.   Medication Recommendations:  See recommendations noted above for OP Lasix dosing with potassium supplementation Other recommendations (labs, testing, etc): Recommend BMP prior to cardiology follow-up to assess renal function and potassium level. Follow up as an outpatient:  Will arrange with Dr. Bronson Ing or APP in ~2-3 weeks.     Glenetta Hew, M.D., M.S. Interventional Cardiologist   Pager # [phone] Phone # [phone] [address] [place], [place] [address]

## 2019-11-29 NOTE — TOC Initial Note (Signed)
Transition of Care [facility]) - Initial/Assessment Note    Patient Details  Name: Maria Fry MRN: [id number] Date of Birth: 1951/01/07  Transition [facility]) CM/SW Contact:    Maria Collet, RN Phone Number: 11/29/2019, 4:08 PM  Clinical Narrative:              Patient admitted from home for SOB. Lives with spouse, independent. PCP Maria Fry, Maria Nunnery, MD Cardiology Maria Commons, MD Coverage HTA Will continue to follow.   Expected Discharge Plan: Home/Self Care Barriers to Discharge: Continued Medical Work up   Patient Goals and CMS Choice        Expected Discharge Plan and Services Expected Discharge Plan: Home/Self Care                                              Prior Living Arrangements/Services   Lives with:: Spouse                   Activities of Daily Living Home Assistive Devices/Equipment: Cane (specify quad or straight) ADL Screening (condition at time of admission) Patient's cognitive ability adequate to safely complete daily activities?: Yes Is the patient deaf or have difficulty hearing?: No Does the patient have difficulty seeing, even when wearing glasses/contacts?: No Does the patient have difficulty concentrating, remembering, or making decisions?: No Patient able to express need for assistance with ADLs?: Yes Does the patient have difficulty dressing or bathing?: No Independently performs ADLs?: Yes (appropriate for developmental age) Does the patient have difficulty walking or climbing stairs?: No Weakness of Legs: None Weakness of Arms/Hands: None  Permission Sought/Granted                  Emotional Assessment              Admission diagnosis:  SOB (shortness of breath) [R06.02] Demand ischemia (HCC) [I24.8] Acute exacerbation of CHF (congestive heart failure) ([place]) [I50.9] Acute on chronic systolic congestive heart failure ([place]) [I50.23] Patient Active Problem List   Diagnosis Date Noted  .  Hypokalemia 11/28/2019  . Nonadherence to medical treatment 11/28/2019  . Anemia 11/28/2019  . Acute exacerbation of CHF (congestive heart failure) ([place]) 11/27/2019  . Hyperlipidemia 11/27/2019  . Peripheral edema 05/18/2019  . OA (osteoarthritis) of knee 05/18/2019  . DDD (degenerative disc disease), lumbar 05/18/2019  . Obesity 12/26/2017  . Seasonal allergies 08/19/2016  . Glucose intolerance (impaired glucose tolerance) 05/14/2016  . Chronic hip pain 05/14/2016  . Chronic combined systolic and diastolic heart failure ([place]) 05/14/2016  . Hypothyroidism following radioiodine therapy 11/30/2015  . CAD (coronary artery disease)   . NSTEMI (non-ST elevated myocardial infarction) ([place])   . Hypertension 06/05/2015   PCP:  Maria Rossetti, MD Pharmacy:   [facility] [address] [place] [phone] [name] [address] Phone: [phone] Fax: [phone]     Social Determinants of Health (SDOH) Interventions    Readmission Risk Interventions No flowsheet data found.

## 2019-11-29 NOTE — Plan of Care (Signed)

## 2019-11-29 NOTE — Progress Notes (Signed)
    Patient presented for Lexiscan nuclear stress test. Tolerated procedure well. Pending final stress imaging result.  Daune Perch, AGNP-C 11/29/2019  9:41 AM Pager: [phone]

## 2019-11-29 NOTE — Progress Notes (Signed)
Patient was able to ambulate to the end of the hall and back to her room with minimal breaks. Patient reports some leg weakness towards the end of the walk and a small amount of dizziness. Oxygen saturation remains at 97% on room air. RR of 24 post walk but after 5 minutes of rest patient RR improved to 18.

## 2019-11-29 NOTE — Plan of Care (Signed)
  Problem: Clinical Measurements: Goal: Ability to maintain clinical measurements within normal limits will improve Outcome: Progressing Goal: Diagnostic test results will improve Outcome: Progressing Note: Stress test complete. Findings discussed by cardiology and patient is aware of clinical progression and need for compliance when returning home.  Goal: Respiratory complications will improve Outcome: Progressing Note: Patient tolerating increased ambulation   Problem: Activity: Goal: Risk for activity intolerance will decrease Outcome: Progressing   Problem: Coping: Goal: Level of anxiety will decrease Outcome: Progressing   Problem: Pain Managment: Goal: General experience of comfort will improve Outcome: Progressing

## 2019-11-29 NOTE — Progress Notes (Signed)
Stress Test results received. MD notified of results in chart.

## 2019-11-30 LAB — COMPREHENSIVE METABOLIC PANEL
ALT: 17 U/L (ref 0–44)
AST: 19 U/L (ref 15–41)
Albumin: 3 g/dL — ABNORMAL LOW (ref 3.5–5.0)
Alkaline Phosphatase: 69 U/L (ref 38–126)
Anion gap: 9 (ref 5–15)
BUN: 15 mg/dL (ref 8–23)
CO2: 28 mmol/L (ref 22–32)
Calcium: 8.9 mg/dL (ref 8.9–10.3)
Chloride: 101 mmol/L (ref 98–111)
Creatinine, Ser: 1.21 mg/dL — ABNORMAL HIGH (ref 0.44–1.00)
GFR calc Af Amer: 54 mL/min — ABNORMAL LOW (ref >60)
GFR calc non Af Amer: 47 mL/min — ABNORMAL LOW (ref >60)
Glucose, Bld: 112 mg/dL — ABNORMAL HIGH (ref 70–99)
Potassium: 4.2 mmol/L (ref 3.5–5.1)
Sodium: 138 mmol/L (ref 135–145)
Total Bilirubin: 0.2 mg/dL — ABNORMAL LOW (ref 0.3–1.2)
Total Protein: 6.9 g/dL (ref 6.5–8.1)

## 2019-11-30 LAB — MAGNESIUM: Magnesium: 2.1 mg/dL (ref 1.7–2.4)

## 2019-11-30 MED ORDER — FUROSEMIDE 40 MG PO TABS: ORAL_TABLET | ORAL | 0 refills | Status: DC

## 2019-11-30 MED ORDER — POTASSIUM CHLORIDE CRYS ER 15 MEQ PO TBCR: EXTENDED_RELEASE_TABLET | ORAL | 0 refills | Status: DC

## 2019-11-30 MED ORDER — POTASSIUM CHLORIDE CRYS ER 10 MEQ PO TBCR: EXTENDED_RELEASE_TABLET | ORAL | 0 refills | Status: DC

## 2019-11-30 MED FILL — POTASSIUM CHL ER M10 TABLET: 10 | 28 days supply | Qty: 40 | Fill #0

## 2019-11-30 MED FILL — FUROSEMIDE 40 MG TABLET: 40 | 28 days supply | Qty: 40 | Fill #0

## 2019-11-30 NOTE — Discharge Summary (Signed)
Physician Discharge Summary  Maria Fry [id number] DOB: 08/19/1951 DOA: 11/26/2019  PCP: Alycia Rossetti, MD  Admit date: 11/26/2019 Discharge date: 11/30/2019  Admitted From: Home Disposition:  Home   Recommendations for Outpatient Follow-up:  1. Follow up with PCP in 1 week 2. Follow up with cardiology as scheduled on 12/13/2019  Discharge Condition: Stable CODE STATUS: Full  Diet recommendation: Heart healthy   Brief/Interim Summary: From H&P per Dr. Josephine Cables: "HPI: Maria Fry is a 68 y.o. female with medical history significant for HFrEF, hypothyroidism, hypertension and hyperlipidemia who presents to the emergency department via EMS due to shortness of breath which started few hours prior to arrival.  Patient states that she was at home watching TV when she suddenly developed shortness of breath, she complains of dizziness on standing, though she denies chest pain, she states that her symptoms were similar to how she felt when she first had a heart attack, so she called 911, patient was provided with supplemental oxygen via [facility] en route to the ED by EMS team.  She states that she has been taking Lasix only intermittently.  She endorsed leg swelling in the evening, but this usually subside when she wakes up in the morning.  She states fever, chills, diaphoresis, abdominal pain, nausea or vomiting.  ED Course: In the emergency department, she was initially tachypneic, patient was weaned down to supplemental oxygen via [place] at 4lpm with an O2 sat of 97%.  Work-up in the ED showed elevated BNP at 835, initial troponin was 69, subsequent troponin was 202.  X-ray showed cardiomegaly with findings of CHF and pulmonary edema with suspicion for superimposed pneumonia.  She was treated with IV Lasix 40 mg.  IV heparin drip was started due to NSTEMI.Cardiologist was consulted by ED physician and recommended for patient to be transferred to [facility].  Hospitalist was asked to admit patient for  further evaluation and management.  I spoke with Dr. Einar Gip (cardiologist at [facility]) and he agreed to see patient once patient arrives at [facility]."  Interim: Patient diuresed well with IV Lasix which was transitioned to p.o.  She was weaned off of nasal cannula O2.  On day of discharge, she was feeling much better, denied any shortness of breath or chest pain.  She felt ready to be discharged home with family.  Discharge Diagnoses:  Principal Problem:   Acute exacerbation of CHF (congestive heart failure) (HCC) Active Problems:   Hypertension   NSTEMI (non-ST elevated myocardial infarction) ([place])   CAD (coronary artery disease)   Hypothyroidism following radioiodine therapy   Obesity   Hyperlipidemia   Hypokalemia   Nonadherence to medical treatment   Anemia   Acute on chronic combined systolic and diastolic CHF: Symptoms improved with diuresis since admission. Per cardiology upon discharge, continue her baseline 40 mg daily 4 days/week (Tuesday, Thursday, Saturday, Sunday), 3 days/week (Monday, Wednesday and Friday) 40 mg twice daily or 80 mg every morning. Would also recommend taking double her home dose of potassium supplement when taking double dose of Lasix.  Acute hypoxic respiratory failure on presentation needing nonrebreather: Doing well on room air  Hypertension blood pressure fairly controlled continue beta-blockers, diuretics  CAD with demand ischemia elevated troponin.  History of non-ST elevation MI July 2016 with occlusion of the distal LAD, medically managed with aspirin statin beta-blocker and Ranexa.  Elevated troponin likely from CHF,  Trop 69->202->341.  Stress test abnormal and in the absence of an active angina symptom cardiology  advised no further recommendation to continue on current medical therapy  Hyperlipidemia continue statin  AKI Baseline Cr ~1. Cr 1.21 today  Noncompliance patient not using her Lasix, she has been counseled.  Hypothyroidism:  Continue Synthroid.  Anemia likely from chronic Disease: Ferritin and iron panel are normal.  B12 borderline to 69 we will add on B12 oral supplementation  Morbid Obesity Estimated body mass index is 40.95 kg/m as calculated from the following:   Height as of this encounter: 5' (1.524 m).   Weight as of this encounter: 95.1 kg.     Discharge Instructions  Discharge Instructions    (HEART FAILURE PATIENTS) Call MD:  Anytime you have any of the following symptoms: 1) 3 pound weight gain in 24 hours or 5 pounds in 1 week 2) shortness of breath, with or without a dry hacking cough 3) swelling in the hands, feet or stomach 4) if you have to sleep on extra pillows at night in order to breathe.   Complete by: As directed    Call MD for:  difficulty breathing, headache or visual disturbances   Complete by: As directed    Call MD for:  extreme fatigue   Complete by: As directed    Call MD for:  persistant dizziness or light-headedness   Complete by: As directed    Call MD for:  persistant nausea and vomiting   Complete by: As directed    Call MD for:  severe uncontrolled pain   Complete by: As directed    Call MD for:  temperature >100.4   Complete by: As directed    Diet - low sodium heart healthy   Complete by: As directed    Discharge instructions   Complete by: As directed    You were cared for by a hospitalist during your hospital stay. If you have any questions about your discharge medications or the care you received while you were in the hospital after you are discharged, you can call the unit and ask to speak with the hospitalist on call if the hospitalist that took care of you is not available. Once you are discharged, your primary care physician will handle any further medical issues. Please note that NO REFILLS for any discharge medications will be authorized once you are discharged, as it is imperative that you return to your primary care physician (or establish a relationship  with a primary care physician if you do not have one) for your aftercare needs so that they can reassess your need for medications and monitor your lab values.   Increase activity slowly   Complete by: As directed      Allergies as of 11/30/2019      Reactions   Vicodin [hydrocodone-acetaminophen] Other (See Comments)   Severe nausea and diarrhea    Amoxicillin    edema   Demerol [meperidine] Nausea And Vomiting   Lisinopril Other (See Comments)   Throat swollen   Lodine [etodolac] Rash      Medication List    STOP taking these medications   ciprofloxacin 500 MG tablet Commonly known as: Cipro   phenazopyridine 100 MG tablet Commonly known as: Pyridium     TAKE these medications   acetaminophen 500 MG tablet Commonly known as: TYLENOL Take 500 mg by mouth every 6 (six) hours as needed for mild pain or moderate pain. Reported on 04/22/2016   aspirin 81 MG chewable tablet Chew 1 tablet (81 mg total) by mouth daily.   atorvastatin 80  MG tablet Commonly known as: LIPITOR Take 1 tablet (80 mg total) by mouth at bedtime.   furosemide 40 MG tablet Commonly known as: Lasix Take 40 mg daily 4 days/week (Tuesday, Thursday, Saturday, Sunday) and take 40 mg twice daily 3 days/week (Monday, Wednesday and Friday) What changed:   how much to take  how to take this  when to take this  additional instructions   gabapentin 300 MG capsule Commonly known as: NEURONTIN Take 300 mg by mouth at bedtime.   levothyroxine 100 MCG tablet Commonly known as: SYNTHROID TAKE 1 TABLET BY MOUTH ONCE DAILY BEFORE BREAKFAST   loratadine 10 MG tablet Commonly known as: CLARITIN Take 10 mg by mouth daily as needed for allergies.   metoprolol tartrate 25 MG tablet Commonly known as: LOPRESSOR Take 1 tablet by mouth twice daily   nitroGLYCERIN 0.4 MG SL tablet Commonly known as: NITROSTAT DISSOLVE ONE TABLET UNDER THE TONGUE EVERY 5 MINUTES AS NEEDED FOR CHEST PAIN.  DO NOT EXCEED A  TOTAL OF 3 DOSES IN 15 MINUTES   potassium chloride SA 15 MEQ tablet Commonly known as: Klor-Con M15 Take 15 meq 4 days/week (Tuesday, Thursday, Saturday, Sunday) and take total 30 meq 3 days/week (Monday, Wednesday, Friday) What changed:   how much to take  how to take this  when to take this  additional instructions   Ranexa 1000 MG SR tablet Generic drug: ranolazine Take 1 tablet by mouth twice daily      Follow-up Information    Imogene Burn, PA-C Follow up on 12/13/2019.   Specialty: Cardiology Why: at 11:30am for your follow up appt.  Contact information: [place] [address] [phone]        Alycia Rossetti, MD. Schedule an appointment as soon as possible for a visit in 1 week(s).   Specialty: Family Medicine Contact information: [address] [place] [address] [phone]          Allergies  Allergen Reactions  . Vicodin [Hydrocodone-Acetaminophen] Other (See Comments)    Severe nausea and diarrhea   . Amoxicillin     edema  . Demerol [Meperidine] Nausea And Vomiting  . Lisinopril Other (See Comments)    Throat swollen  . Lodine [Etodolac] Rash    Consultations:  Cardiology   Procedures/Studies: DG Chest 2 View  Result Date: 11/28/2019 CLINICAL DATA:  Coronary artery disease, NSTEMI, CHF, hypertension EXAM: CHEST - 2 VIEW COMPARISON:  11/26/2019 FINDINGS: Enlargement of cardiac silhouette. Mediastinal contours and pulmonary vascularity normal. Improved pulmonary infiltrates since previous exam. No pleural effusion or pneumothorax. IMPRESSION: Improved pulmonary infiltrates. Enlargement of cardiac silhouette. Electronically Signed   By: Lavonia Dana M.D.   On: 11/28/2019 13:52   NM Myocar Multi W/Spect Maria Fry Motion / EF  Result Date: 11/29/2019 CLINICAL DATA:  68 year old female with chest pain. EXAM: MYOCARDIAL IMAGING WITH SPECT (REST AND PHARMACOLOGIC-STRESS) GATED LEFT VENTRICULAR WALL MOTION STUDY LEFT  VENTRICULAR EJECTION FRACTION TECHNIQUE: Standard myocardial SPECT imaging was performed after resting intravenous injection of 10 mCi Tc-72m tetrofosmin. Subsequently, intravenous infusion of Lexiscan was performed under the supervision of the Cardiology staff. At peak effect of the drug, 30 mCi Tc-43m tetrofosmin was injected intravenously and standard myocardial SPECT imaging was performed. Quantitative gated imaging was also performed to evaluate left ventricular wall motion, and estimate left ventricular ejection fraction. COMPARISON:  None. FINDINGS: Perfusion: There is a small region of moderate decreased counts in the apical segment of the inferior  wall at which perfusion improved from rest to stress. No additional evidence of ischemia or infarction. Wall Motion: Mild LEFT ventricular dilatation.  Global hypokinesia. Left Ventricular Ejection Fraction: 41 % End diastolic volume 91 ml End systolic volume 65 ml IMPRESSION: 1. Small scar in the apical segment of the inferior wall with peri-infarct ischemia. 2. Global hypokinesia and mild LEFT ventricular dilatation. 3. Left ventricular ejection fraction 41% 4. Non invasive risk stratification*: Intermediate *2012 Appropriate Use Criteria for Coronary Revascularization Focused Update: J Am Coll Cardiol. [id number]. http://content.airportbarriers.com.aspx?articleid=[email or web address] Electronically Signed   By: Suzy Bouchard M.D.   On: 11/29/2019 13:03   DG Chest Port 1 View  Result Date: 11/26/2019 CLINICAL DATA:  68 year old female with shortness of breath. History of CHF and coronary artery disease. EXAM: PORTABLE CHEST 1 VIEW COMPARISON:  Chest radiograph dated 01/24/2019 FINDINGS: Bilateral pulmonary densities with predominant involvement of the lower lobes most likely represent pulmonary edema although pneumonia or superimposed pneumonia on background of edema is not excluded. Clinical correlation is recommended. Small pleural effusions may be  present. No pneumothorax. There is cardiomegaly. Atherosclerotic calcification of the aortic arch. No acute osseous pathology. IMPRESSION: Cardiomegaly with findings of CHF and pulmonary edema. Superimposed pneumonia is not excluded. Clinical correlation is recommended. Electronically Signed   By: Anner Crete M.D.   On: 11/26/2019 22:59   ECHOCARDIOGRAM COMPLETE  Result Date: 11/27/2019   ECHOCARDIOGRAM REPORT   Patient Name:   Maria Fry Date of Exam: 11/27/2019 Medical Rec #:  [id number]    Height:       60.0 in Accession #:    [id number]   Weight:       215.0 lb Date of Birth:  1951-04-25     BSA:          1.92 m Patient Age:    85 years     BP:           137/72 mmHg Patient Gender: F            HR:           67 bpm. Exam Location:  [facility] Procedure: 2D Echo, Cardiac Doppler and Color Doppler Indications:    CHF-Acute Systolic [id number] / [id number]  History:        Patient has prior history of Echocardiogram examinations, most                 recent 08/06/2019. CHF, Previous Myocardial Infarction and CAD;                 Risk Factors:Hypertension and Dyslipidemia.  Sonographer:    Alvino Chapel RCS Referring Phys: [id number] OLADAPO ADEFESO IMPRESSIONS  1. Left ventricular ejection fraction, by visual estimation, is 45 to 50%. The left ventricle has mildly decreased function. There is moderately increased left ventricular hypertrophy.  2. Left ventricular diastolic parameters are consistent with Grade II diastolic dysfunction (pseudonormalization).  3. The left ventricle demonstrates global hypokinesis.  4. Global right ventricle has normal systolic function.The right ventricular size is normal. No increase in right ventricular wall thickness.  5. Left atrial size was severely dilated.  6. Right atrial size was normal.  7. The mitral valve is normal in structure. No evidence of mitral valve regurgitation. No evidence of mitral stenosis.  8. The tricuspid valve is normal in structure.  9. The aortic valve is  normal in structure. Aortic valve regurgitation is trivial. No evidence of aortic valve sclerosis or stenosis. 10. The pulmonic valve was normal  in structure. Pulmonic valve regurgitation is not visualized. 11. Normal pulmonary artery systolic pressure. 12. The inferior vena cava is normal in size with greater than 50% respiratory variability, suggesting right atrial pressure of 3 mmHg. 13. No significant change from prior study. FINDINGS  Left Ventricle: Left ventricular ejection fraction, by visual estimation, is 45 to 50%. The left ventricle has mildly decreased function. The left ventricle demonstrates global hypokinesis. There is moderately increased left ventricular hypertrophy. Left ventricular diastolic parameters are consistent with Grade II diastolic dysfunction (pseudonormalization). Normal left atrial pressure. Right Ventricle: The right ventricular size is normal. No increase in right ventricular wall thickness. Global RV systolic function is has normal systolic function. The tricuspid regurgitant velocity is 2.44 m/s, and with an assumed right atrial pressure  of 3 mmHg, the estimated right ventricular systolic pressure is normal at 26.8 mmHg. Left Atrium: Left atrial size was severely dilated. Right Atrium: Right atrial size was normal in size Pericardium: There is no evidence of pericardial effusion. Mitral Valve: The mitral valve is normal in structure. No evidence of mitral valve regurgitation. No evidence of mitral valve stenosis by observation. Tricuspid Valve: The tricuspid valve is normal in structure. Tricuspid valve regurgitation is trivial. Aortic Valve: The aortic valve is normal in structure. Aortic valve regurgitation is trivial. Aortic regurgitation PHT measures 526 msec. The aortic valve is structurally normal, with no evidence of sclerosis or stenosis. Pulmonic Valve: The pulmonic valve was normal in structure. Pulmonic valve regurgitation is not visualized. Pulmonic regurgitation is  not visualized. Aorta: The aortic root, ascending aorta and aortic arch are all structurally normal, with no evidence of dilitation or obstruction. Venous: The inferior vena cava is normal in size with greater than 50% respiratory variability, suggesting right atrial pressure of 3 mmHg. IAS/Shunts: No atrial level shunt detected by color flow Doppler. There is no evidence of a patent foramen ovale. No ventricular septal defect is seen or detected. There is no evidence of an atrial septal defect.  LEFT VENTRICLE PLAX 2D LVIDd:         5.41 cm       Diastology LVIDs:         4.13 cm       LV e' lateral:   8.11 cm/s LV PW:         1.31 cm       LV E/e' lateral: 9.2 LV IVS:        1.86 cm       LV e' medial:    4.14 cm/s LVOT diam:     1.80 cm       LV E/e' medial:  18.0 LV SV:         66 ml LV SV Index:   31.78 LVOT Area:     2.54 cm  LV Volumes (MOD) LV area d, A2C:    20.80 cm LV area d, A4C:    30.30 cm LV area s, A2C:    13.00 cm LV area s, A4C:    18.80 cm LV major d, A2C:   7.29 cm LV major d, A4C:   8.25 cm LV major s, A2C:   6.10 cm LV major s, A4C:   7.01 cm LV vol d, MOD A2C: 50.1 ml LV vol d, MOD A4C: 91.5 ml LV vol s, MOD A2C: 24.0 ml LV vol s, MOD A4C: 42.8 ml LV SV MOD A2C:     26.1 ml LV SV MOD A4C:     91.5 ml  LV SV MOD BP:      37.7 ml RIGHT VENTRICLE RV S prime:     15.55 cm/s TAPSE (M-mode): 2.1 cm LEFT ATRIUM            Index LA diam:      3.80 cm  1.97 cm/m LA Vol (A2C): 117.0 ml 60.78 ml/m  AORTIC VALVE LVOT Vmax:   88.40 cm/s LVOT Vmean:  59.300 cm/s LVOT VTI:    0.172 m AI PHT:      526 msec  AORTA Ao Root diam: 3.10 cm MITRAL VALVE                        TRICUSPID VALVE MV Area (PHT): 3.77 cm             TR Peak grad:   23.8 mmHg MV PHT:        58.29 msec           TR Vmax:        244.00 cm/s MV Decel Time: 201 msec MV E velocity: 74.70 cm/s 103 cm/s  SHUNTS MV A velocity: 31.20 cm/s 70.3 cm/s Systemic VTI:  0.17 m MV E/A ratio:  2.39       1.5       Systemic Diam: 1.80 cm  Candee Furbish  MD Electronically signed by Candee Furbish MD Signature Date/Time: 11/27/2019/12:15:46 PM    Final        Discharge Exam: Vitals:   11/29/19 2020 11/30/19 0403  BP: 131/71 118/61  Pulse: 64 62  Resp: 20 20  Temp: 98.3 F (36.8 C) 98.1 F (36.7 C)  SpO2: 95% 94%    General: Pt is alert, awake, not in acute distress Cardiovascular: RRR, S1/S2 +, no edema Respiratory: CTA bilaterally, no wheezing, no rhonchi, no respiratory distress, no conversational dyspnea  Abdominal: Soft, NT, ND, bowel sounds + Extremities: no edema, no cyanosis Psych: Normal mood and affect, stable judgement and insight     The results of significant diagnostics from this hospitalization (including imaging, microbiology, ancillary and laboratory) are listed below for reference.     Microbiology: Recent Results (from the past 240 hour(s))  SARS CORONAVIRUS 2 (TAT 6-24 HRS) Nasopharyngeal Nasopharyngeal Swab     Status: None   Collection Time: 11/27/19  2:16 AM   Specimen: Nasopharyngeal Swab  Result Value Ref Range Status   SARS Coronavirus 2 NEGATIVE NEGATIVE Final    Comment: (NOTE) SARS-CoV-2 target nucleic acids are NOT DETECTED. The SARS-CoV-2 RNA is generally detectable in upper and lower respiratory specimens during the acute phase of infection. Negative results do not preclude SARS-CoV-2 infection, do not rule out co-infections with other pathogens, and should not be used as the sole basis for treatment or other patient management decisions. Negative results must be combined with clinical observations, patient history, and epidemiological information. The expected result is Negative. Fact Sheet for Patients: SugarRoll.be Fact Sheet for Healthcare Providers: https://www.woods-mathews.com/ This test is not yet approved or cleared by the [place] FDA and  has been authorized for detection and/or diagnosis of SARS-CoV-2 by FDA under an Emergency Use  Authorization (EUA). This EUA will remain  in effect (meaning this test can be used) for the duration of the COVID-19 declaration under Section 56 4(b)(1) of the Act, 21 U.S.C. section 360bbb-3(b)(1), unless the authorization is terminated or revoked sooner. Performed at [facility], [place] [address], [place], [place] [address]      Labs:  BNP (last 3 results) Recent Labs    01/24/19 1206 11/26/19 2325  BNP 1,025.0* [id number]*   Basic Metabolic Panel: Recent Labs  Lab 11/26/19 2325 11/28/19 0512 11/29/19 0507 11/30/19 0511  NA 139 137 138 138  K 4.2 3.3* 4.0 4.2  CL 107 102 101 101  CO2 26 27 30 28   GLUCOSE 78 103* 103* 112*  BUN 22 14 14 15   CREATININE 1.20* 0.97 1.16* 1.21*  CALCIUM 8.4* 8.3* 8.4* 8.9  MG  --  1.9 2.1 2.1  PHOS  --  2.7 2.9  --    Liver Function Tests: Recent Labs  Lab 11/26/19 2325 11/28/19 0512 11/29/19 0507 11/30/19 0511  AST 20 20 18 19   ALT 19 17 15 17   ALKPHOS 85 71 70 69  BILITOT 0.5 0.5 0.4 0.2*  PROT 7.4 6.5 6.8 6.9  ALBUMIN 3.4* 2.9* 2.9* 3.0*   No results for input(s): LIPASE, AMYLASE in the last 168 hours. No results for input(s): AMMONIA in the last 168 hours. CBC: Recent Labs  Lab 11/26/19 2325 11/28/19 0512 11/29/19 0507  WBC 10.1 7.3 7.4  HGB 11.5* 10.6* 10.8*  HCT 36.7 32.4* 32.3*  MCV 98.1 93.9 92.6  PLT 273 243 243   Cardiac Enzymes: No results for input(s): CKTOTAL, CKMB, CKMBINDEX, TROPONINI in the last 168 hours. BNP: Invalid input(s): POCBNP CBG: No results for input(s): GLUCAP in the last 168 hours. D-Dimer No results for input(s): DDIMER in the last 72 hours. Hgb A1c No results for input(s): HGBA1C in the last 72 hours. Lipid Profile No results for input(s): CHOL, HDL, LDLCALC, TRIG, CHOLHDL, LDLDIRECT in the last 72 hours. Thyroid function studies No results for input(s): TSH, T4TOTAL, T3FREE, THYROIDAB in the last 72 hours.  Invalid input(s): FREET3 Anemia work up Recent Labs     11/29/19 0507  VITAMINB12 269  FERRITIN 16  TIBC 318  IRON 38   Urinalysis    Component Value Date/Time   COLORURINE YELLOW 06/29/2019 0918   APPEARANCEUR CLOUDY (A) 06/29/2019 0918   LABSPEC 1.020 06/29/2019 0918   PHURINE 7.5 06/29/2019 0918   GLUCOSEU NEGATIVE 06/29/2019 0918   HGBUR 1+ (A) 06/29/2019 0918   BILIRUBINUR NEGATIVE 01/17/2016 2018   KETONESUR NEGATIVE 06/29/2019 0918   PROTEINUR 1+ (A) 06/29/2019 0918   NITRITE NEGATIVE 06/29/2019 0918   LEUKOCYTESUR 3+ (A) 06/29/2019 0918   Sepsis Labs Invalid input(s): PROCALCITONIN,  WBC,  LACTICIDVEN Microbiology Recent Results (from the past 240 hour(s))  SARS CORONAVIRUS 2 (TAT 6-24 HRS) Nasopharyngeal Nasopharyngeal Swab     Status: None   Collection Time: 11/27/19  2:16 AM   Specimen: Nasopharyngeal Swab  Result Value Ref Range Status   SARS Coronavirus 2 NEGATIVE NEGATIVE Final    Comment: (NOTE) SARS-CoV-2 target nucleic acids are NOT DETECTED. The SARS-CoV-2 RNA is generally detectable in upper and lower respiratory specimens during the acute phase of infection. Negative results do not preclude SARS-CoV-2 infection, do not rule out co-infections with other pathogens, and should not be used as the sole basis for treatment or other patient management decisions. Negative results must be combined with clinical observations, patient history, and epidemiological information. The expected result is Negative. Fact Sheet for Patients: SugarRoll.be Fact Sheet for Healthcare Providers: https://www.woods-mathews.com/ This test is not yet approved or cleared by the [place] FDA and  has been authorized for detection and/or diagnosis of SARS-CoV-2 by FDA under an Emergency Use Authorization (EUA). This EUA will remain  in effect (meaning this test  can be used) for the duration of the COVID-19 declaration under Section 56 4(b)(1) of the Act, 21 U.S.C. section 360bbb-3(b)(1),  unless the authorization is terminated or revoked sooner. Performed at [facility], [place] [address] [place], [place] [address]      Patient was seen and examined on the day of discharge and was found to be in stable condition. Time coordinating discharge: 40 minutes including assessment and coordination of care, as well as examination of the patient.   SIGNED:  Dessa Phi, DO [facility] 11/30/2019, 10:47 AM

## 2019-12-06 ENCOUNTER — Other Ambulatory Visit: Payer: Self-pay | Admitting: Cardiovascular Disease

## 2019-12-08 DIAGNOSIS — R9439 Abnormal result of other cardiovascular function study: Secondary | ICD-10-CM | POA: Insufficient documentation

## 2019-12-08 NOTE — Progress Notes (Signed)
Cardiology Office Note    Date:  12/13/2019   ID:  Maria Fry, DOB Apr 07, 1951, MRN [id number]  PCP:  Alycia Rossetti, MD  Cardiologist: Kate Sable, MD EPS: None  Chief Complaint  Patient presents with  . Hospitalization Follow-up    History of Present Illness:  Maria Fry is a 69 y.o. female with history of CAD status post NSTEMI 2016 with known CTO of the distal LAD, chronic combined systolic diastolic CHF EF [id number], hypertension, HLD, hypothyroidism, history of medical noncompliance.  Patient was seen in the hospital 11/27/2019 for CHF or not taking Lasix as prescribed because of frequent urination.  Was also eating out frequently.  She diuresed with IV Lasix.  Not on ACE inhibitor because of throat swelling on lisinopril.  She was sent home on Lasix 40 mg daily 4 days a week and twice daily 3 days a week.  Troponins were mildly elevated 69, 202, 341 but no angina.  Lexiscan Myoview 11/29/2019 small scar in the apical segment of the inferior wall with peri-infarct ischemia, global hypokinesis, LVEF 41%, intermediate risk study.  Cardiology recommended medical therapy since she was asymptomatic.  2D echo LVEF 45 to 50% with grade 2 DD.  Patient comes in for f/u. Trying to make better choices. Eats out a lot. Has gained 5-6 lbs since home from the hospital. Denies shortness of breath or edema. Thinks she's getting too many calories. Labs 12/10/19 Crt 1.21.BNP 171.  Patient complains of leg pain at night-aching relieved with voltaren cream.     Past Medical History:  Diagnosis Date  . CAD (coronary artery disease)    a. 06/07/15 NSTEMI/Cath: Dist LAD 100%, mid LAD 10%. Otw nl cors. Nl EF w/ inferoapical AK.  Marland Kitchen Chronic combined systolic (congestive) and diastolic (congestive) heart failure ([place]) 04/23/2016   a. 04/2016 Echo: EF 45%, Gr2 DD; b. 11/2019 Echo: EF 45-50%, mod LVH. Gr2 DD. Nl RV fxn. Sev dil LA.   . Essential hypertension   . Hip pain   . Hyperlipidemia   .  Hypothyroidism   . MI (myocardial infarction) ([place]) 06/05/2015  . Vitamin D deficiency     Past Surgical History:  Procedure Laterality Date  . ABDOMINAL HYSTERECTOMY    . CARDIAC CATHETERIZATION N/A 06/06/2015   Procedure: Left Heart Cath and Coronary Angiography;  Surgeon: Peter M Martinique, MD;  Location: [facility];  Service: Cardiovascular;  Laterality: N/A;  . CHOLECYSTECTOMY    . COLONOSCOPY    . COLONOSCOPY N/A 08/13/2017   Procedure: COLONOSCOPY;  Surgeon: Daneil Dolin, MD;  Location: [facility];  Service: Endoscopy;  Laterality: N/A;  8:30 AM  . ESOPHAGOGASTRODUODENOSCOPY N/A 09/11/2016   Procedure: ESOPHAGOGASTRODUODENOSCOPY (EGD);  Surgeon: Daneil Dolin, MD;  Location: [facility];  Service: Endoscopy;  Laterality: N/A;  7:45 am - moved to 10/11 @ 10:30  . Heel spurs    . MALONEY DILATION N/A 09/11/2016   Procedure: Venia Minks DILATION;  Surgeon: Daneil Dolin, MD;  Location: [facility];  Service: Endoscopy;  Laterality: N/A;  . POLYPECTOMY  08/13/2017   Procedure: POLYPECTOMY;  Surgeon: Daneil Dolin, MD;  Location: [facility];  Service: Endoscopy;;  colon  . SHOULDER ARTHROSCOPY    . TUBAL LIGATION      Current Medications: Current Meds  Medication Sig  . acetaminophen (TYLENOL) 500 MG tablet Take 500 mg by mouth every 6 (six) hours as needed for mild pain or moderate pain. Reported on  04/22/2016  . aspirin 81 MG chewable tablet Chew 1 tablet (81 mg total) by mouth daily.  Marland Kitchen atorvastatin (LIPITOR) 80 MG tablet Take 1 tablet (80 mg total) by mouth at bedtime.  . furosemide (LASIX) 40 MG tablet Take 40 mg daily 4 days/week (Tuesday, Thursday, Saturday, Sunday) and take 40 mg twice daily 3 days/week (Monday, Wednesday and Friday)  . gabapentin (NEURONTIN) 300 MG capsule Take 300 mg by mouth at bedtime.   Marland Kitchen levothyroxine (SYNTHROID) 100 MCG tablet TAKE 1 TABLET BY MOUTH ONCE DAILY BEFORE BREAKFAST  . loratadine (CLARITIN) 10 MG tablet Take 10 mg by mouth  daily as needed for allergies.  . metoprolol tartrate (LOPRESSOR) 25 MG tablet Take 1 tablet by mouth twice daily  . nitroGLYCERIN (NITROSTAT) 0.4 MG SL tablet DISSOLVE ONE TABLET UNDER THE TONGUE EVERY 5 MINUTES AS NEEDED FOR CHEST PAIN.  DO NOT EXCEED A TOTAL OF 3 DOSES IN 15 MINUTES  . potassium chloride SA (KLOR-CON) 10 MEQ tablet Take 1 tablet (10 meq) 4 days/week (Tuesday, Thursday, Saturday, Sunday) and take 2 tablets (20 meq) 3 days/week (Monday, Wednesday, Friday)  . RANEXA 1000 MG SR tablet Take 1 tablet by mouth twice daily     Allergies:   Vicodin [hydrocodone-acetaminophen], Amoxicillin, Demerol [meperidine], Lisinopril, and Lodine [etodolac]   Social History   Socioeconomic History  . Marital status: Single    Spouse name: Not on file  . Number of children: Not on file  . Years of education: Not on file  . Highest education level: Not on file  Occupational History  . Not on file  Tobacco Use  . Smoking status: Former Smoker    Types: Cigarettes    Start date: 12/02/1977    Quit date: 12/02/2002    Years since quitting: 17.0  . Smokeless tobacco: Never Used  Substance and Sexual Activity  . Alcohol use: No    Alcohol/week: 0.0 standard drinks  . Drug use: No  . Sexual activity: Yes  Other Topics Concern  . Not on file  Social History Narrative  . Not on file   Social Determinants of Health   Financial Resource Strain:   . Difficulty of Paying Living Expenses: Not on file  Food Insecurity:   . Worried About Charity fundraiser in the Last Year: Not on file  . Ran Out of Food in the Last Year: Not on file  Transportation Needs:   . Lack of Transportation (Medical): Not on file  . Lack of Transportation (Non-Medical): Not on file  Physical Activity:   . Days of Exercise per Week: Not on file  . Minutes of Exercise per Session: Not on file  Stress:   . Feeling of Stress : Not on file  Social Connections:   . Frequency of Communication with Friends and Family:  Not on file  . Frequency of Social Gatherings with Friends and Family: Not on file  . Attends Religious Services: Not on file  . Active Member of Clubs or Organizations: Not on file  . Attends Archivist Meetings: Not on file  . Marital Status: Not on file     Family History:  The patient's   family history includes Cancer in her brother; Heart attack in her brother and father; Heart failure in her brother and father; Stroke in her sister.   ROS:   Please see the history of present illness.    ROS All other systems reviewed and are negative.   PHYSICAL EXAM:  VS:  BP 134/78   Pulse 64   Temp (!) 96.6 F (35.9 C)   Ht 5' (1.524 m)   Wt 215 lb (97.5 kg)   SpO2 98%   BMI 41.99 kg/m   Physical Exam  GEN: Obese, in no acute distress  neck: no JVD, carotid bruits, or masses Cardiac:RRR; no murmurs, rubs, or gallops  Respiratory:  clear to auscultation bilaterally, normal work of breathing GI: soft, nontender, nondistended, + BS Ext: Compression stockings in place otherwise without cyanosis, clubbing, or edema, Good distal pulses bilaterally Neuro:  Alert and Oriented x 3 Psych: euthymic mood, full affect  Wt Readings from Last 3 Encounters:  12/13/19 215 lb (97.5 kg)  12/10/19 214 lb (97.1 kg)  11/30/19 209 lb 11.2 oz (95.1 kg)      Studies/Labs Reviewed:   EKG:  EKG is not ordered today.    Recent Labs: 12/29/2018: TSH 4.20 11/30/2019: Magnesium 2.1 12/10/2019: ALT 18; Brain Natriuretic Peptide 171; BUN 20; Creat 1.21; Hemoglobin 11.3; Platelets 267; Potassium 4.2; Sodium 139   Lipid Panel    Component Value Date/Time   CHOL 123 01/01/2019 0952   TRIG 57 01/01/2019 0952   HDL 45 (L) 01/01/2019 0952   CHOLHDL 2.7 01/01/2019 0952   VLDL 12 06/23/2017 0902   LDLCALC 65 01/01/2019 0952    Additional studies/ records that were reviewed today include:  11/27/19 IMPRESSIONS      1. Left ventricular ejection fraction, by visual estimation, is 45 to 50%.  The left ventricle has mildly decreased function. There is moderately increased left ventricular hypertrophy.  2. Left ventricular diastolic parameters are consistent with Grade II diastolic dysfunction (pseudonormalization).  3. The left ventricle demonstrates global hypokinesis.  4. Global right ventricle has normal systolic function.The right ventricular size is normal. No increase in right ventricular wall thickness.  5. Left atrial size was severely dilated.  6. Right atrial size was normal.  7. The mitral valve is normal in structure. No evidence of mitral valve regurgitation. No evidence of mitral stenosis.  8. The tricuspid valve is normal in structure.  9. The aortic valve is normal in structure. Aortic valve regurgitation is trivial. No evidence of aortic valve sclerosis or stenosis. 10. The pulmonic valve was normal in structure. Pulmonic valve regurgitation is not visualized. 11. Normal pulmonary artery systolic pressure. 12. The inferior vena cava is normal in size with greater than 50% respiratory variability, suggesting right atrial pressure of 3 mmHg. 13. No significant change from prior study.   Lexiscan 11/29/19 IMPRESSION: 1. Small scar in the apical segment of the inferior wall with peri-infarct ischemia.   2. Global hypokinesia and mild LEFT ventricular dilatation.   3. Left ventricular ejection fraction 41%   4. Non invasive risk stratification*: Intermediate   *2012 Appropriate Use Criteria for Coronary Revascularization Focused Update: J Am Coll Cardiol. N6492421. http://content.[email or web address].aspx?articleid=[email or web address]     Electronically Signed   By: Suzy Bouchard M.D.   On: 11/29/2019 13:03     ASSESSMENT:    1. Chronic combined systolic and diastolic heart failure ([place])   2. Abnormal nuclear stress test   3. Coronary artery disease involving native coronary artery of native heart without angina pectoris   4. Essential hypertension    5. Mixed hyperlipidemia      PLAN:  In order of problems listed above:  Chronic combined systolic and diastolic CHF with recent exacerbation requiring IV Lasix in the hospital with good diuresis.  Was not taking Lasix properly  and getting extra salt in her diet at that time.  Echo LVEF 45 to 50% with global hypokinesis and grade 2 DD.  Patient has gained approximately 5 pounds since discharge but thinks she is taking in too many calories.  No heart failure on exam.  Labs checked by PCP 12/10/2019 creatinine stable at 1.21 and BNP was 171 down from hospitalization of over 800.  Continue current dose Lasix and potassium. 2 gm sodium diet and 20-30 min exercise daily.  Abnormal Lexiscan intermediate risk study with scar in the apical segment of the inferior wall with peri-infarct ischemia EF 41%.  Mild troponin elevation.  Patient was asymptomatic so medical therapy recommended.  CAD status post NSTEMI 06/2015 with occlusion of the distal LAD treated medically-no angina  Essential hypertension controlled  Hyperlipidemia on statin LDL 65 01/01/19 should be repeated next visit.  Medication Adjustments/Labs and Tests Ordered: Current medicines are reviewed at length with the patient today.  Concerns regarding medicines are outlined above.  Medication changes, Labs and Tests ordered today are listed in the Patient Instructions below. Patient Instructions   Medication Instructions:  Your physician recommends that you continue on your current medications as directed. Please refer to the Current Medication list given to you today.  *If you need a refill on your cardiac medications before your next appointment, please call your pharmacy*  Lab Work: NONE If you have labs (blood work) drawn today and your tests are completely normal, you will receive your results only by: Marland Kitchen MyChart Message (if you have MyChart) OR . A paper copy in the mail If you have any lab test that is abnormal or we need to  change your treatment, we will call you to review the results.  Testing/Procedures: NONE  Follow-Up: At [facility], you and your health needs are our priority.  As part of our continuing mission to provide you with exceptional heart care, we have created designated Provider Care Teams.  These Care Teams include your primary Cardiologist (physician) and Advanced Practice Providers (APPs -  Physician Assistants and Nurse Practitioners) who all work together to provide you with the care you need, when you need it.  Your next appointment:   2 month(s)  The format for your next appointment:   In Person  Provider:   Kate Sable, MD  Other Instructions Two Gram Sodium Diet 2000 mg  What is Sodium? Sodium is a mineral found naturally in many foods. The most significant source of sodium in the diet is table salt, which is about 40% sodium.  Processed, convenience, and preserved foods also contain a large amount of sodium.  The body needs only 500 mg of sodium daily to function,  A normal diet provides more than enough sodium even if you do not use salt.  Why Limit Sodium? A build up of sodium in the body can cause thirst, increased blood pressure, shortness of breath, and water retention.  Decreasing sodium in the diet can reduce edema and risk of heart attack or stroke associated with high blood pressure.  Keep in mind that there are many other factors involved in these health problems.  Heredity, obesity, lack of exercise, cigarette smoking, stress and what you eat all play a role.  General Guidelines:  Do not add salt at the table or in cooking.  One teaspoon of salt contains over 2 grams of sodium.  Read food labels  Avoid processed and convenience foods  Ask your dietitian before eating any foods not dicussed  in the menu planning guidelines  Consult your physician if you wish to use a salt substitute or a sodium containing medication such as antacids.  Limit milk and milk  products to 16 oz (2 cups) per day.  Shopping Hints:  READ LABELS!! "Dietetic" does not necessarily mean low sodium.  Salt and other sodium ingredients are often added to foods during processing.   Menu Planning Guidelines Food Group Choose More Often Avoid  Beverages (see also the milk group All fruit juices, low-sodium, salt-free vegetables juices, low-sodium carbonated beverages Regular vegetable or tomato juices, commercially softened water used for drinking or cooking  Breads and Cereals Enriched white, wheat, rye and pumpernickel bread, hard rolls and dinner rolls; muffins, cornbread and waffles; most dry cereals, cooked cereal without added salt; unsalted crackers and breadsticks; low sodium or homemade bread crumbs Bread, rolls and crackers with salted tops; quick breads; instant hot cereals; pancakes; commercial bread stuffing; self-rising flower and biscuit mixes; regular bread crumbs or cracker crumbs  Desserts and Sweets Desserts and sweets mad with mild should be within allowance Instant pudding mixes and cake mixes  Fats Butter or margarine; vegetable oils; unsalted salad dressings, regular salad dressings limited to 1 Tbs; light, sour and heavy cream Regular salad dressings containing bacon fat, bacon bits, and salt pork; snack dips made with instant soup mixes or processed cheese; salted nuts  Fruits Most fresh, frozen and canned fruits Fruits processed with salt or sodium-containing ingredient (some dried fruits are processed with sodium sulfites        Vegetables Fresh, frozen vegetables and low- sodium canned vegetables Regular canned vegetables, sauerkraut, pickled vegetables, and others prepared in brine; frozen vegetables in sauces; vegetables seasoned with ham, bacon or salt pork  Condiments, Sauces, Miscellaneous  Salt substitute with physician's approval; pepper, herbs, spices; vinegar, lemon or lime juice; hot pepper sauce; garlic powder, onion powder, low sodium soy  sauce (1 Tbs.); low sodium condiments (ketchup, chili sauce, mustard) in limited amounts (1 tsp.) fresh ground horseradish; unsalted tortilla chips, pretzels, potato chips, popcorn, salsa (1/4 cup) Any seasoning made with salt including garlic salt, celery salt, onion salt, and seasoned salt; sea salt, rock salt, kosher salt; meat tenderizers; monosodium glutamate; mustard, regular soy sauce, barbecue, sauce, chili sauce, teriyaki sauce, steak sauce, Worcestershire sauce, and most flavored vinegars; canned gravy and mixes; regular condiments; salted snack foods, olives, picles, relish, horseradish sauce, catsup   Food preparation: Try these seasonings Meats:    Pork Sage, onion Serve with applesauce  Chicken Poultry seasoning, thyme, parsley Serve with cranberry sauce  Lamb Curry powder, rosemary, garlic, thyme Serve with mint sauce or jelly  Veal Marjoram, basil Serve with current jelly, cranberry sauce  Beef Pepper, bay leaf Serve with dry mustard, unsalted chive butter  Fish [place] leaf, dill Serve with unsalted lemon butter, unsalted parsley butter  Vegetables:    Asparagus Lemon juice   Broccoli Lemon juice   Carrots Mustard dressing parsley, mint, nutmeg, glazed with unsalted butter and sugar   Green beans Marjoram, lemon juice, nutmeg,dill seed   Tomatoes Basil, marjoram, onion   Spice /blend for [employer]" 4 tsp ground thyme 1 tsp ground sage 3 tsp ground rosemary 4 tsp ground marjoram   Test your knowledge 1. A product that says "Salt Free" may still contain sodium. True or False 2. Garlic Powder and Hot Pepper Sauce an be used as alternative seasonings.True or False 3. Processed foods have more sodium than fresh foods.  True or False  4. Canned Vegetables have less sodium than froze True or False  WAYS TO DECREASE YOUR SODIUM INTAKE 1. Avoid the use of added salt in cooking and at the table.  Table salt (and other prepared seasonings which contain salt) is probably one of the  greatest sources of sodium in the diet.  Unsalted foods can gain flavor from the sweet, sour, and butter taste sensations of herbs and spices.  Instead of using salt for seasoning, try the following seasonings with the foods listed.  Remember: how you use them to enhance natural food flavors is limited only by your creativity... Allspice-Meat, fish, eggs, fruit, peas, red and yellow vegetables Almond Extract-Fruit baked goods Anise Seed-Sweet breads, fruit, carrots, beets, cottage cheese, cookies (tastes like licorice) Basil-Meat, fish, eggs, vegetables, rice, vegetables salads, soups, sauces Bay Leaf-Meat, fish, stews, poultry Burnet-Salad, vegetables (cucumber-like flavor) Caraway Seed-Bread, cookies, cottage cheese, meat, vegetables, cheese, rice Cardamon-Baked goods, fruit, soups Celery Powder or seed-Salads, salad dressings, sauces, meatloaf, soup, bread.Do not use  celery salt Chervil-Meats, salads, fish, eggs, vegetables, cottage cheese (parsley-like flavor) Chili Power-Meatloaf, chicken cheese, corn, eggplant, egg dishes Chives-Salads cottage cheese, egg dishes, soups, vegetables, sauces Cilantro-Salsa, casseroles Cinnamon-Baked goods, fruit, pork, lamb, chicken, carrots Cloves-Fruit, baked goods, fish, pot roast, green beans, beets, carrots Coriander-Pastry, cookies, meat, salads, cheese (lemon-orange flavor) Cumin-Meatloaf, fish,cheese, eggs, cabbage,fruit pie (caraway flavor) [employer], fruit, eggs, fish, poultry, cottage cheese, vegetables Dill Seed-Meat, cottage cheese, poultry, vegetables, fish, salads, bread Fennel Seed-Bread, cookies, apples, pork, eggs, fish, beets, cabbage, cheese, Licorice-like flavor Garlic-(buds or powder) Salads, meat, poultry, fish, bread, butter, vegetables, potatoes.Do not  use garlic salt Ginger-Fruit, vegetables, baked goods, meat, fish, poultry Horseradish Root-Meet, vegetables, butter Lemon Juice or Extract-Vegetables, fruit, tea, baked  goods, fish salads Mace-Baked goods fruit, vegetables, fish, poultry (taste like nutmeg) Maple Extract-Syrups Marjoram-Meat, chicken, fish, vegetables, breads, green salads (taste like Sage) Mint-Tea, lamb, sherbet, vegetables, desserts, carrots, cabbage Mustard, Dry or Seed-Cheese, eggs, meats, vegetables, poultry Nutmeg-Baked goods, fruit, chicken, eggs, vegetables, desserts Onion Powder-Meat, fish, poultry, vegetables, cheese, eggs, bread, rice salads (Do not use   Onion salt) Orange Extract-Desserts, baked goods Oregano-Pasta, eggs, cheese, onions, pork, lamb, fish, chicken, vegetables, green salads Paprika-Meat, fish, poultry, eggs, cheese, vegetables Parsley Flakes-Butter, vegetables, meat fish, poultry, eggs, bread, salads (certain forms may   Contain sodium Pepper-Meat fish, poultry, vegetables, eggs Peppermint Extract-Desserts, baked goods Poppy Seed-Eggs, bread, cheese, fruit dressings, baked goods, noodles, vegetables, cottage  [employer], poultry, meat, fish, cauliflower, turnips,eggs bread Saffron-Rice, bread, veal, chicken, fish, eggs Sage-Meat, fish, poultry, onions, eggplant, tomateos, pork, stews Savory-Eggs, salads, poultry, meat, rice, vegetables, soups, pork Tarragon-Meat, poultry, fish, eggs, butter, vegetables (licorice-like flavor)  Thyme-Meat, poultry, fish, eggs, vegetables, (clover-like flavor), sauces, soups Tumeric-Salads, butter, eggs, fish, rice, vegetables (saffron-like flavor) Vanilla Extract-Baked goods, candy Vinegar-Salads, vegetables, meat marinades Walnut Extract-baked goods, candy  2. Choose your Foods Wisely   The following is a list of foods to avoid which are high in sodium:  Meats-Avoid all smoked, canned, salt cured, dried and kosher meat and fish as well as Anchovies   [employer] meats:Bologna, Liverwurst, Pastrami Canned meat or fish  Marinated herring Caviar    Pepperoni Corned Beef    Pizza Dried chipped beef  Salami Frozen breaded fish or meat Salt pork Frankfurters or hot dogs  Sardines Gefilte fish   Sausage Ham (boiled ham, Proscuitto Smoked butt    spiced ham)   Spam      TV  Dinners Vegetables Canned vegetables (Regular) Relish Canned mushrooms  Sauerkraut Olives    Tomato juice Pickles  Bakery and Dessert Products Canned puddings  Cream pies Cheesecake   Decorated cakes Cookies  Beverages/Juices Tomato juice, regular  Gatorade   V-8 vegetable juice, regular  Breads and Cereals Biscuit mixes   Salted potato chips, corn chips, pretzels Bread stuffing mixes  Salted crackers and rolls Pancake and waffle mixes Self-rising flour  Seasonings Accent    Meat sauces Barbecue sauce  Meat tenderizer Catsup    Monosodium glutamate (MSG) Celery salt   Onion salt Chili sauce   Prepared mustard Garlic salt   Salt, seasoned salt, sea salt Gravy mixes   Soy sauce Horseradish   Steak sauce Ketchup   Tartar sauce Lite salt    Teriyaki sauce Marinade mixes   Worcestershire sauce  Others Baking powder   Cocoa and cocoa mixes Baking soda   Commercial casserole mixes Candy-caramels, chocolate  Dehydrated soups    Bars, fudge,nougats  Instant rice and pasta mixes Canned broth or soup  Maraschino cherries Cheese, aged and processed cheese and cheese spreads  Learning Assessment Quiz  Indicated T (for True) or F (for False) for each of the following statements:  1. _____ Fresh fruits and vegetables and unprocessed grains are generally low in sodium 2. _____ Water may contain a considerable amount of sodium, depending on the source 3. _____ You can always tell if a food is high in sodium by tasting it 4. _____ Certain laxatives my be high in sodium and should be avoided unless prescribed   by a physician or pharmacist 5. _____ Salt substitutes may be used freely by anyone on a sodium restricted diet 6. _____ Sodium is present in table salt, food additives and  as a natural component of   most foods 7. _____ Table salt is approximately 90% sodium 8. _____ Limiting sodium intake may help prevent excess fluid accumulation in the body 9. _____ On a sodium-restricted diet, seasonings such as bouillon soy sauce, and    cooking wine should be used in place of table salt 10. _____ On an ingredient list, a product which lists monosodium glutamate as the first   ingredient is an appropriate food to include on a low sodium diet  Circle the best answer(s) to the following statements (Hint: there may be more than one correct answer)  11. On a low-sodium diet, some acceptable snack items are:    A. Olives  F. Bean dip   K. Grapefruit juice    B. Salted Pretzels G. Commercial Popcorn   L. Canned peaches    C. Carrot Sticks  H. Bouillon   M. Unsalted nuts   D. [place] fries  I. Peanut butter crackers N. Salami   E. Sweet pickles J. Tomato Juice   O. Pizza  12.  Seasonings that may be used freely on a reduced - sodium diet include   A. Lemon wedges F.Monosodium glutamate K. Celery seed    B.Soysauce   G. Pepper   L. Mustard powder   C. Sea salt  H. Cooking wine  M. Onion flakes   D. Vinegar  E. Prepared horseradish N. Salsa   E. Sage   J. Worcestershire sauce  O. [address]      Sumner Boast, PA-C  12/13/2019 11:54 AM    [facility] [place], [place], [place]  [address] Phone: [phone]; Fax: [phone]

## 2019-12-10 ENCOUNTER — Encounter: Payer: Self-pay | Admitting: Family Medicine

## 2019-12-10 ENCOUNTER — Ambulatory Visit (INDEPENDENT_AMBULATORY_CARE_PROVIDER_SITE_OTHER): Payer: PPO | Admitting: Family Medicine

## 2019-12-10 ENCOUNTER — Other Ambulatory Visit: Payer: Self-pay

## 2019-12-10 VITALS — BP 144/74 | HR 62 | Temp 98.2°F | Resp 16 | Ht 61.0 in | Wt 214.0 lb

## 2019-12-10 DIAGNOSIS — D649 Anemia, unspecified: Secondary | ICD-10-CM

## 2019-12-10 DIAGNOSIS — I5042 Chronic combined systolic (congestive) and diastolic (congestive) heart failure: Secondary | ICD-10-CM | POA: Diagnosis not present

## 2019-12-10 DIAGNOSIS — I2510 Atherosclerotic heart disease of native coronary artery without angina pectoris: Secondary | ICD-10-CM | POA: Diagnosis not present

## 2019-12-10 NOTE — Progress Notes (Signed)
Subjective:    Patient ID: Maria Fry, female    DOB: 06-29-1951, 69 y.o.   MRN: [id number]  Patient presents for Hospital F/U (CHF)  Patient here for hospital follow-up.  She was admitted to the hospital on Christmas Day and due to shortness of breath.  She was actually brought in via EMS.  She was evaluated by cardiology.  Elevated troponins in the setting of acute exacerbation of her CHF.  Was given IV Lasix diuresis.  Her home regimen is now 40 mg 4 days a week and 80 mg 3 days a week ( M,W,F) She did not require oxygen at discharge.  Note it was stated that she was not using her Lasix as prescribed and she was counseled on that.  She does admit that she had not been taking her water pill because she was Christmas shopping and did not want to have to go to the bathroom all the time.  She has history of NSTEMI she has significantly elevated troponins which I thought was likely due to her CHF she is already on medical therapy including Ranexa catheterization was not performed.  Anemia of chronic disease her iron panel and ferritin levels were normal her B12 was ordered on low at 269 so she was started on oral B12 supplementation she has not started this vitamin was not aware what dosing.  She has follow-up with cardiology on Monday.  Last creatinine at discharge 1.21  Hb 10.8/HCT 32.3    Weights at home- 209lb at discharge, at home yesterday was 213lbs.  She has been eating TV dinners including pot pies which do have significant sodium in them.   She denies any SOB, no ches tpain, no vomiting, no URI symptoms   Review Of Systems:  GEN- denies fatigue, fever, weight loss,weakness, recent illness HEENT- denies eye drainage, change in vision, nasal discharge, CVS- denies chest pain, palpitations RESP- denies SOB, cough, wheeze ABD- denies N/V, change in stools, abd pain GU- denies dysuria, hematuria, dribbling, incontinence MSK- denies joint pain, muscle aches, injury Neuro- denies  headache, dizziness, syncope, seizure activity       Objective:    BP (!) 144/74   Pulse 62   Temp 98.2 F (36.8 C) (Temporal)   Resp 16   Ht 5\' 1"  (1.549 m)   Wt 214 lb (97.1 kg)   SpO2 96%   BMI 40.43 kg/m  GEN- NAD, alert and oriented x3 HEENT- PERRL, EOMI, non injected sclera, pink conjunctiva, MMM, oropharynx clear Neck- Supple, no thyromegaly CVS- RRR, no murmur RESP-CTAB ABD-NABS,soft,NT,ND EXT- No edema Pulses- Radial, DP- 2+        Assessment & Plan:      Problem List Items Addressed This Visit      Unprioritized   Anemia    She will start B12 1000 mcg once a day.      Relevant Orders   CBC with Differential/Platelet   COMPLETE METABOLIC PANEL WITH GFR   Iron, TIBC and Ferritin Panel   Brain natriuretic peptide   CAD (coronary artery disease) - Primary    No recent chest pain.  Blood pressure is mildly elevated today but her weight is also up.  She is not having symptoms of CHF today comes in a have her take an extra dose of Lasix tomorrow so she will take 80 mg 2 days in a row then revert back to her typical schedule of 80 mg 3 times a week and 40 mg the other days.  She will also take her potassium with this.  She will keep her follow-up with cardiology on Monday.  We did discuss dietary changes and avoiding the pre-Packaged foods that have high sodium content.  Also discussed compliance with her diuretic which she understands.      Relevant Orders   CBC with Differential/Platelet   COMPLETE METABOLIC PANEL WITH GFR   Iron, TIBC and Ferritin Panel   Brain natriuretic peptide   Chronic combined systolic and diastolic heart failure (HCC)   Relevant Orders   CBC with Differential/Platelet   COMPLETE METABOLIC PANEL WITH GFR   Iron, TIBC and Ferritin Panel   Brain natriuretic peptide      Note: This dictation was prepared with Dragon dictation along with smaller phrase technology. Any transcriptional errors that result from this process are  unintentional.

## 2019-12-10 NOTE — Patient Instructions (Addendum)
Start taking B12 1000MCG once a day over the counter We will call with lab results  Take extra lasix on Saturday ( 2 lasix and 2 potasasium) Then resume your regular dosing Avoid the TV dinner  F/U as previous

## 2019-12-10 NOTE — Assessment & Plan Note (Signed)
No recent chest pain.  Blood pressure is mildly elevated today but her weight is also up.  She is not having symptoms of CHF today comes in a have her take an extra dose of Lasix tomorrow so she will take 80 mg 2 days in a row then revert back to her typical schedule of 80 mg 3 times a week and 40 mg the other days.  She will also take her potassium with this.  She will keep her follow-up with cardiology on Monday.  We did discuss dietary changes and avoiding the pre-Packaged foods that have high sodium content.  Also discussed compliance with her diuretic which she understands.

## 2019-12-10 NOTE — Assessment & Plan Note (Signed)
She will start B12 1000 mcg once a day.

## 2019-12-11 LAB — COMPLETE METABOLIC PANEL WITH GFR
AG Ratio: 0.9 (calc) — ABNORMAL LOW (ref 1.0–2.5)
ALT: 18 U/L (ref 6–29)
AST: 19 U/L (ref 10–35)
Albumin: 3.4 g/dL — ABNORMAL LOW (ref 3.6–5.1)
Alkaline phosphatase (APISO): 77 U/L (ref 37–153)
BUN/Creatinine Ratio: 17 (calc) (ref 6–22)
BUN: 20 mg/dL (ref 7–25)
CO2: 25 mmol/L (ref 20–32)
Calcium: 8.7 mg/dL (ref 8.6–10.4)
Chloride: 104 mmol/L (ref 98–110)
Creat: 1.21 mg/dL — ABNORMAL HIGH (ref 0.50–0.99)
GFR, Est African American: 54 mL/min/{1.73_m2} — ABNORMAL LOW (ref >=60)
GFR, Est Non African American: 47 mL/min/{1.73_m2} — ABNORMAL LOW (ref >=60)
Globulin: 3.6 g/dL (calc) (ref 1.9–3.7)
Glucose, Bld: 97 mg/dL (ref 65–99)
Potassium: 4.2 mmol/L (ref 3.5–5.3)
Sodium: 139 mmol/L (ref 135–146)
Total Bilirubin: 0.4 mg/dL (ref 0.2–1.2)
Total Protein: 7 g/dL (ref 6.1–8.1)

## 2019-12-11 LAB — CBC WITH DIFFERENTIAL/PLATELET
Absolute Monocytes: 492 cells/uL (ref 200–950)
Basophils Absolute: 30 cells/uL (ref 0–200)
Basophils Relative: 0.5 %
Eosinophils Absolute: 120 cells/uL (ref 15–500)
Eosinophils Relative: 2 %
HCT: 35.3 % (ref 35.0–45.0)
Hemoglobin: 11.3 g/dL — ABNORMAL LOW (ref 11.7–15.5)
Lymphs Abs: 1902 cells/uL (ref 850–3900)
MCH: 30.7 pg (ref 27.0–33.0)
MCHC: 32 g/dL (ref 32.0–36.0)
MCV: 95.9 fL (ref 80.0–100.0)
MPV: 10.7 fL (ref 7.5–12.5)
Monocytes Relative: 8.2 %
Neutro Abs: 3456 cells/uL (ref 1500–7800)
Neutrophils Relative %: 57.6 %
Platelets: 267 10*3/uL (ref 140–400)
RBC: 3.68 10*6/uL — ABNORMAL LOW (ref 3.80–5.10)
RDW: 13.7 % (ref 11.0–15.0)
Total Lymphocyte: 31.7 %
WBC: 6 10*3/uL (ref 3.8–10.8)

## 2019-12-11 LAB — IRON,TIBC AND FERRITIN PANEL
%SAT: 23 % (calc) (ref 16–45)
Ferritin: 14 ng/mL — ABNORMAL LOW (ref 16–288)
Iron: 71 ug/dL (ref 45–160)
TIBC: 313 mcg/dL (calc) (ref 250–450)

## 2019-12-11 LAB — BRAIN NATRIURETIC PEPTIDE: Brain Natriuretic Peptide: 171 pg/mL — ABNORMAL HIGH (ref <100)

## 2019-12-13 ENCOUNTER — Ambulatory Visit: Payer: PPO | Admitting: Physician Assistant

## 2019-12-13 ENCOUNTER — Encounter: Payer: Self-pay | Admitting: Physician Assistant

## 2019-12-13 VITALS — BP 134/78 | HR 64 | Temp 96.6°F | Ht 60.0 in | Wt 215.0 lb

## 2019-12-13 DIAGNOSIS — E782 Mixed hyperlipidemia: Secondary | ICD-10-CM

## 2019-12-13 DIAGNOSIS — I2510 Atherosclerotic heart disease of native coronary artery without angina pectoris: Secondary | ICD-10-CM

## 2019-12-13 DIAGNOSIS — I10 Essential (primary) hypertension: Secondary | ICD-10-CM | POA: Diagnosis not present

## 2019-12-13 DIAGNOSIS — R9439 Abnormal result of other cardiovascular function study: Secondary | ICD-10-CM | POA: Diagnosis not present

## 2019-12-13 DIAGNOSIS — I5042 Chronic combined systolic (congestive) and diastolic (congestive) heart failure: Secondary | ICD-10-CM

## 2019-12-13 NOTE — Patient Instructions (Signed)
Medication Instructions:  Your physician recommends that you continue on your current medications as directed. Please refer to the Current Medication list given to you today.  *If you need a refill on your cardiac medications before your next appointment, please call your pharmacy*  Lab Work: NONE If you have labs (blood work) drawn today and your tests are completely normal, you will receive your results only by: Marland Kitchen MyChart Message (if you have MyChart) OR . A paper copy in the mail If you have any lab test that is abnormal or we need to change your treatment, we will call you to review the results.  Testing/Procedures: NONE  Follow-Up: At [facility], you and your health needs are our priority.  As part of our continuing mission to provide you with exceptional heart care, we have created designated Provider Care Teams.  These Care Teams include your primary Cardiologist (physician) and Advanced Practice Providers (APPs -  Physician Assistants and Nurse Practitioners) who all work together to provide you with the care you need, when you need it.  Your next appointment:   2 month(s)  The format for your next appointment:   In Person  Provider:   Kate Sable, MD  Other Instructions Two Gram Sodium Diet 2000 mg  What is Sodium? Sodium is a mineral found naturally in many foods. The most significant source of sodium in the diet is table salt, which is about 40% sodium.  Processed, convenience, and preserved foods also contain a large amount of sodium.  The body needs only 500 mg of sodium daily to function,  A normal diet provides more than enough sodium even if you do not use salt.  Why Limit Sodium? A build up of sodium in the body can cause thirst, increased blood pressure, shortness of breath, and water retention.  Decreasing sodium in the diet can reduce edema and risk of heart attack or stroke associated with high blood pressure.  Keep in mind that there are many other  factors involved in these health problems.  Heredity, obesity, lack of exercise, cigarette smoking, stress and what you eat all play a role.  General Guidelines:  Do not add salt at the table or in cooking.  One teaspoon of salt contains over 2 grams of sodium.  Read food labels  Avoid processed and convenience foods  Ask your dietitian before eating any foods not dicussed in the menu planning guidelines  Consult your physician if you wish to use a salt substitute or a sodium containing medication such as antacids.  Limit milk and milk products to 16 oz (2 cups) per day.  Shopping Hints:  READ LABELS!! "Dietetic" does not necessarily mean low sodium.  Salt and other sodium ingredients are often added to foods during processing.   Menu Planning Guidelines Food Group Choose More Often Avoid  Beverages (see also the milk group All fruit juices, low-sodium, salt-free vegetables juices, low-sodium carbonated beverages Regular vegetable or tomato juices, commercially softened water used for drinking or cooking  Breads and Cereals Enriched white, wheat, rye and pumpernickel bread, hard rolls and dinner rolls; muffins, cornbread and waffles; most dry cereals, cooked cereal without added salt; unsalted crackers and breadsticks; low sodium or homemade bread crumbs Bread, rolls and crackers with salted tops; quick breads; instant hot cereals; pancakes; commercial bread stuffing; self-rising flower and biscuit mixes; regular bread crumbs or cracker crumbs  Desserts and Sweets Desserts and sweets mad with mild should be within allowance Instant pudding mixes and cake  mixes  Fats Butter or margarine; vegetable oils; unsalted salad dressings, regular salad dressings limited to 1 Tbs; light, sour and heavy cream Regular salad dressings containing bacon fat, bacon bits, and salt pork; snack dips made with instant soup mixes or processed cheese; salted nuts  Fruits Most fresh, frozen and canned fruits  Fruits processed with salt or sodium-containing ingredient (some dried fruits are processed with sodium sulfites        Vegetables Fresh, frozen vegetables and low- sodium canned vegetables Regular canned vegetables, sauerkraut, pickled vegetables, and others prepared in brine; frozen vegetables in sauces; vegetables seasoned with ham, bacon or salt pork  Condiments, Sauces, Miscellaneous  Salt substitute with physician's approval; pepper, herbs, spices; vinegar, lemon or lime juice; hot pepper sauce; garlic powder, onion powder, low sodium soy sauce (1 Tbs.); low sodium condiments (ketchup, chili sauce, mustard) in limited amounts (1 tsp.) fresh ground horseradish; unsalted tortilla chips, pretzels, potato chips, popcorn, salsa (1/4 cup) Any seasoning made with salt including garlic salt, celery salt, onion salt, and seasoned salt; sea salt, rock salt, kosher salt; meat tenderizers; monosodium glutamate; mustard, regular soy sauce, barbecue, sauce, chili sauce, teriyaki sauce, steak sauce, Worcestershire sauce, and most flavored vinegars; canned gravy and mixes; regular condiments; salted snack foods, olives, picles, relish, horseradish sauce, catsup   Food preparation: Try these seasonings Meats:    Pork Sage, onion Serve with applesauce  Chicken Poultry seasoning, thyme, parsley Serve with cranberry sauce  Lamb Curry powder, rosemary, garlic, thyme Serve with mint sauce or jelly  Veal Marjoram, basil Serve with current jelly, cranberry sauce  Beef Pepper, bay leaf Serve with dry mustard, unsalted chive butter  Fish [place] leaf, dill Serve with unsalted lemon butter, unsalted parsley butter  Vegetables:    Asparagus Lemon juice   Broccoli Lemon juice   Carrots Mustard dressing parsley, mint, nutmeg, glazed with unsalted butter and sugar   Green beans Marjoram, lemon juice, nutmeg,dill seed   Tomatoes Basil, marjoram, onion   Spice /blend for [employer]" 4 tsp ground thyme 1 tsp ground sage  3 tsp ground rosemary 4 tsp ground marjoram   Test your knowledge 1. A product that says "Salt Free" may still contain sodium. True or False 2. Garlic Powder and Hot Pepper Sauce an be used as alternative seasonings.True or False 3. Processed foods have more sodium than fresh foods.  True or False 4. Canned Vegetables have less sodium than froze True or False  WAYS TO DECREASE YOUR SODIUM INTAKE 1. Avoid the use of added salt in cooking and at the table.  Table salt (and other prepared seasonings which contain salt) is probably one of the greatest sources of sodium in the diet.  Unsalted foods can gain flavor from the sweet, sour, and butter taste sensations of herbs and spices.  Instead of using salt for seasoning, try the following seasonings with the foods listed.  Remember: how you use them to enhance natural food flavors is limited only by your creativity... Allspice-Meat, fish, eggs, fruit, peas, red and yellow vegetables Almond Extract-Fruit baked goods Anise Seed-Sweet breads, fruit, carrots, beets, cottage cheese, cookies (tastes like licorice) Basil-Meat, fish, eggs, vegetables, rice, vegetables salads, soups, sauces Bay Leaf-Meat, fish, stews, poultry Burnet-Salad, vegetables (cucumber-like flavor) Caraway Seed-Bread, cookies, cottage cheese, meat, vegetables, cheese, rice Cardamon-Baked goods, fruit, soups Celery Powder or seed-Salads, salad dressings, sauces, meatloaf, soup, bread.Do not use  celery salt Chervil-Meats, salads, fish, eggs, vegetables, cottage cheese (parsley-like flavor) Chili Power-Meatloaf, chicken cheese, corn, eggplant,  egg dishes Chives-Salads cottage cheese, egg dishes, soups, vegetables, sauces Cilantro-Salsa, casseroles Cinnamon-Baked goods, fruit, pork, lamb, chicken, carrots Cloves-Fruit, baked goods, fish, pot roast, green beans, beets, carrots Coriander-Pastry, cookies, meat, salads, cheese (lemon-orange flavor) Cumin-Meatloaf, fish,cheese, eggs,  cabbage,fruit pie (caraway flavor) [employer], fruit, eggs, fish, poultry, cottage cheese, vegetables Dill Seed-Meat, cottage cheese, poultry, vegetables, fish, salads, bread Fennel Seed-Bread, cookies, apples, pork, eggs, fish, beets, cabbage, cheese, Licorice-like flavor Garlic-(buds or powder) Salads, meat, poultry, fish, bread, butter, vegetables, potatoes.Do not  use garlic salt Ginger-Fruit, vegetables, baked goods, meat, fish, poultry Horseradish Root-Meet, vegetables, butter Lemon Juice or Extract-Vegetables, fruit, tea, baked goods, fish salads Mace-Baked goods fruit, vegetables, fish, poultry (taste like nutmeg) Maple Extract-Syrups Marjoram-Meat, chicken, fish, vegetables, breads, green salads (taste like Sage) Mint-Tea, lamb, sherbet, vegetables, desserts, carrots, cabbage Mustard, Dry or Seed-Cheese, eggs, meats, vegetables, poultry Nutmeg-Baked goods, fruit, chicken, eggs, vegetables, desserts Onion Powder-Meat, fish, poultry, vegetables, cheese, eggs, bread, rice salads (Do not use   Onion salt) Orange Extract-Desserts, baked goods Oregano-Pasta, eggs, cheese, onions, pork, lamb, fish, chicken, vegetables, green salads Paprika-Meat, fish, poultry, eggs, cheese, vegetables Parsley Flakes-Butter, vegetables, meat fish, poultry, eggs, bread, salads (certain forms may   Contain sodium Pepper-Meat fish, poultry, vegetables, eggs Peppermint Extract-Desserts, baked goods Poppy Seed-Eggs, bread, cheese, fruit dressings, baked goods, noodles, vegetables, cottage  [employer], poultry, meat, fish, cauliflower, turnips,eggs bread Saffron-Rice, bread, veal, chicken, fish, eggs Sage-Meat, fish, poultry, onions, eggplant, tomateos, pork, stews Savory-Eggs, salads, poultry, meat, rice, vegetables, soups, pork Tarragon-Meat, poultry, fish, eggs, butter, vegetables (licorice-like flavor)  Thyme-Meat, poultry, fish, eggs, vegetables,  (clover-like flavor), sauces, soups Tumeric-Salads, butter, eggs, fish, rice, vegetables (saffron-like flavor) Vanilla Extract-Baked goods, candy Vinegar-Salads, vegetables, meat marinades Walnut Extract-baked goods, candy  2. Choose your Foods Wisely   The following is a list of foods to avoid which are high in sodium:  Meats-Avoid all smoked, canned, salt cured, dried and kosher meat and fish as well as Anchovies   [employer] meats:Bologna, Liverwurst, Pastrami Canned meat or fish  Marinated herring Caviar    Pepperoni Corned Beef   Pizza Dried chipped beef  Salami Frozen breaded fish or meat Salt pork Frankfurters or hot dogs  Sardines Gefilte fish   Sausage Ham (boiled ham, Proscuitto Smoked butt    spiced ham)   Spam      TV Dinners Vegetables Canned vegetables (Regular) Relish Canned mushrooms  Sauerkraut Olives    Tomato juice Pickles  Bakery and Dessert Products Canned puddings  Cream pies Cheesecake   Decorated cakes Cookies  Beverages/Juices Tomato juice, regular  Gatorade   V-8 vegetable juice, regular  Breads and Cereals Biscuit mixes   Salted potato chips, corn chips, pretzels Bread stuffing mixes  Salted crackers and rolls Pancake and waffle mixes Self-rising flour  Seasonings Accent    Meat sauces Barbecue sauce  Meat tenderizer Catsup    Monosodium glutamate (MSG) Celery salt   Onion salt Chili sauce   Prepared mustard Garlic salt   Salt, seasoned salt, sea salt Gravy mixes   Soy sauce Horseradish   Steak sauce Ketchup   Tartar sauce Lite salt    Teriyaki sauce Marinade mixes   Worcestershire sauce  Others Baking powder   Cocoa and cocoa mixes Baking soda   Commercial casserole mixes Candy-caramels, chocolate  Dehydrated soups    Bars, fudge,nougats  Instant rice and pasta mixes Canned broth or soup  Maraschino cherries Cheese, aged and processed cheese and  cheese spreads  Learning Assessment Quiz  Indicated T (for True) or F  (for False) for each of the following statements:  1. _____ Fresh fruits and vegetables and unprocessed grains are generally low in sodium 2. _____ Water may contain a considerable amount of sodium, depending on the source 3. _____ You can always tell if a food is high in sodium by tasting it 4. _____ Certain laxatives my be high in sodium and should be avoided unless prescribed   by a physician or pharmacist 5. _____ Salt substitutes may be used freely by anyone on a sodium restricted diet 6. _____ Sodium is present in table salt, food additives and as a natural component of   most foods 7. _____ Table salt is approximately 90% sodium 8. _____ Limiting sodium intake may help prevent excess fluid accumulation in the body 9. _____ On a sodium-restricted diet, seasonings such as bouillon soy sauce, and    cooking wine should be used in place of table salt 10. _____ On an ingredient list, a product which lists monosodium glutamate as the first   ingredient is an appropriate food to include on a low sodium diet  Circle the best answer(s) to the following statements (Hint: there may be more than one correct answer)  11. On a low-sodium diet, some acceptable snack items are:    A. Olives  F. Bean dip   K. Grapefruit juice    B. Salted Pretzels G. Commercial Popcorn   L. Canned peaches    C. Carrot Sticks  H. Bouillon   M. Unsalted nuts   D. [place] fries  I. Peanut butter crackers N. Salami   E. Sweet pickles J. Tomato Juice   O. Pizza  12.  Seasonings that may be used freely on a reduced - sodium diet include   A. Lemon wedges F.Monosodium glutamate K. Celery seed    B.Soysauce   G. Pepper   L. Mustard powder   C. Sea salt  H. Cooking wine  M. Onion flakes   D. Vinegar  E. Prepared horseradish N. Salsa   E. Sage   J. Worcestershire sauce  O. Chutney

## 2020-01-03 ENCOUNTER — Other Ambulatory Visit: Payer: Self-pay

## 2020-01-03 ENCOUNTER — Ambulatory Visit (INDEPENDENT_AMBULATORY_CARE_PROVIDER_SITE_OTHER): Payer: PPO | Admitting: Family Medicine

## 2020-01-03 ENCOUNTER — Encounter: Payer: Self-pay | Admitting: Family Medicine

## 2020-01-03 VITALS — BP 124/74 | HR 80 | Temp 97.8°F | Resp 14 | Ht 60.0 in | Wt 216.0 lb

## 2020-01-03 DIAGNOSIS — E782 Mixed hyperlipidemia: Secondary | ICD-10-CM

## 2020-01-03 DIAGNOSIS — D649 Anemia, unspecified: Secondary | ICD-10-CM

## 2020-01-03 DIAGNOSIS — R7302 Impaired glucose tolerance (oral): Secondary | ICD-10-CM | POA: Diagnosis not present

## 2020-01-03 DIAGNOSIS — Z0000 Encounter for general adult medical examination without abnormal findings: Secondary | ICD-10-CM | POA: Diagnosis not present

## 2020-01-03 DIAGNOSIS — Z23 Encounter for immunization: Secondary | ICD-10-CM | POA: Diagnosis not present

## 2020-01-03 DIAGNOSIS — E890 Postprocedural hypothyroidism: Secondary | ICD-10-CM

## 2020-01-03 DIAGNOSIS — Z6841 Body Mass Index (BMI) 40.0 and over, adult: Secondary | ICD-10-CM

## 2020-01-03 DIAGNOSIS — I10 Essential (primary) hypertension: Secondary | ICD-10-CM | POA: Diagnosis not present

## 2020-01-03 MED ORDER — SHINGRIX 50 MCG/0.5ML IM SUSR: 0.5000 mL | Freq: Once | INTRAMUSCULAR | 1 refills | Status: AC

## 2020-01-03 NOTE — Progress Notes (Signed)
Subjective:   Patient presents for Medicare Annual/Subsequent preventive examination.   Pt here for wellness   Medications reviewed    CAD- taking BP meds, followed by cardiology , recent admission for CHF   she has been watching the salt, she stopped eating TV dinners,   weight at home 216lbs She is taking 80mg , M,W,F and 40mg  all other days     Hypothryoidism- managed by endocrioloogy taking synthroid 164mcg once a day    Myasthenia Gravis- taking gabapentin 300mg  at bedtime   B12 deficiency Anemia- she is on oral supplement 1000mg  once a day and mild iron def, ferritin low at  14 last month         Review Past Medical/Family/Social: Per EMR    Risk Factors  Current exercise habits: No recent exercise  she is able to do chores/ clean with little SOB  Dietary issues discussed:YES  Cardiac risk factors: Obesity (BMI >= 30 kg/m2). CAD  Depression Screen  (Note: if answer to either of the following is "Yes", a more complete depression screening is indicated)  Over the past two weeks, have you felt down, depressed or hopeless? No Over the past two weeks, have you felt little interest or pleasure in doing things? No Have you lost interest or pleasure in daily life? No Do you often feel hopeless? No Do you cry easily over simple problems? No   Activities of Daily Living  In your present state of health, do you have any difficulty performing the following activities?:  Driving? No  Managing money? No  Feeding yourself? No  Getting from bed to chair? No  Climbing a flight of stairs? No  Preparing food and eating?: No  Bathing or showering? No  Getting dressed: No  Getting to the toilet? No  Using the toilet:No  Moving around from place to place: No  In the past year have you fallen or had a near fall?:No  Are you sexually active? No  Do you have more than one partner? No   Hearing Difficulties: No  Do you often ask people to speak up or repeat  themselves? No  Do you experience ringing or noises in your ears? No Do you have difficulty understanding soft or whispered voices? No  Do you feel that you have a problem with memory? No Do you often misplace items? No  Do you feel safe at home? Yes  Cognitive Testing  Alert? Yes Normal Appearance?Yes  Oriented to person? Yes Place? Yes  Time? Yes  Recall of three objects? Yes  Can perform simple calculations? Yes  Displays appropriate judgment?Yes  Can read the correct time from a watch face?Yes   List the Names of Other Physician/Practitioners you currently use:   Endocrinoloy - 2/4 Dr. [name]  Cardiology - Dr. Jacinta Shoe  Neurology- Dr. Merlene Laughter    Screening Tests / Date Colonoscopy    UTD                 Zostavax  dUE  Mammogram  UTD- done April 2020 Influenza Vaccine  utd Tetanus/tdap  utd Bone density- UTD Normal  2019 Pneumonia-due for PNA 23   ROS:  GEN- denies fatigue, fever, weight loss,weakness, recent illness HEENT- denies eye drainage, change in vision, nasal discharge, CVS- denies chest pain, palpitations RESP- denies SOB, cough, wheeze ABD- denies N/V, change in stools, abd pain GU- denies dysuria, hematuria, dribbling, incontinence MSK- denies joint pain, muscle aches, injury Neuro- denies headache, dizziness, syncope, seizure activity  Physical: Vitals  reviewed   GEN- NAD, alert and oriented x3 HEENT- PERRL, EOMI, non injected sclera, pink conjunctiva, MMM, oropharynx clear Neck- Supple, no thryomegaly, no bruit  CVS- RRR, no murmur RESP-CTAB ABD-NABS,soft,NT,ND EXT- TRACE PEDAL  edema Pulses- Radial, DP- 2+    Assessment:    Annual wellness medicare exam   Plan:    During the course of the visit the patient was educated and counseled about appropriate screening and preventive services including:  Screening mammography pt to schedule Patient to schedule eye exam  Immunizations: Pneumonia vaccine 23  given Shingles vaccine.  Prescription given to that she can get the vaccine at the pharmacy or Medicare part D.   Fall/depression/audit C screening negative  Patient given copy of advance directives    Coronary artery disease hypertension well controlled reviewed last cardiology note at bedside with her  Hypthyroidism followed by endocrinology - has appt this month   Will obtain cholesterol and CBC / BMET  Anemia- recheck ferritin level  Obesity -she has been adjusting diet, per above ,reducing sodium, more heart healthy, no weight gain since last visit   Diet review for nutrition referral? Yes ____ Not Indicated __x__  Patient Instructions (the written plan) was given to the patient.  Medicare Attestation  I have personally reviewed:  The patient's medical and social history  Their use of alcohol, tobacco or illicit drugs  Their current medications and supplements  The patient's functional ability including ADLs,fall risks, home safety risks, cognitive, and hearing and visual impairment  Diet and physical activities  Evidence for depression or mood disorders  The patient's weight, height, BMI, and visual acuity have been recorded in the chart. I have made referrals, counseling, and provided education to the patient based on review of the above and I have provided the patient with a written personalized care plan for preventive services.

## 2020-01-03 NOTE — Patient Instructions (Addendum)
COVID Vaccination Information As of right now, we will not be giving COVID-19 vaccines here in our office. It is too many storage and administrating regulations that our office is not equipped to provide at this time.    You can go online at [email or web address]   That website will give you information on all counties.    If not here are the numbers you can call. [place] or [place] [phone] opt.2 [facility] Department [phone]   You can also find information on [email or web address] or call the state's COVID-19 information phone number at 211.   Pneumonia vaccine given We will call with lab results  F/U 4 months

## 2020-01-04 ENCOUNTER — Telehealth: Payer: Self-pay | Admitting: Cardiovascular Disease

## 2020-01-04 LAB — LIPID PANEL
Cholesterol: 123 mg/dL (ref <200)
HDL: 46 mg/dL — ABNORMAL LOW (ref >=50)
LDL Cholesterol (Calc): 63 mg/dL (calc)
Non-HDL Cholesterol (Calc): 77 mg/dL (calc) (ref <130)
Total CHOL/HDL Ratio: 2.7 (calc) (ref <5.0)
Triglycerides: 68 mg/dL (ref <150)

## 2020-01-04 LAB — BASIC METABOLIC PANEL
BUN/Creatinine Ratio: 17 (calc) (ref 6–22)
BUN: 18 mg/dL (ref 7–25)
CO2: 27 mmol/L (ref 20–32)
Calcium: 8.5 mg/dL — ABNORMAL LOW (ref 8.6–10.4)
Chloride: 104 mmol/L (ref 98–110)
Creat: 1.03 mg/dL — ABNORMAL HIGH (ref 0.50–0.99)
Glucose, Bld: 92 mg/dL (ref 65–99)
Potassium: 4.4 mmol/L (ref 3.5–5.3)
Sodium: 139 mmol/L (ref 135–146)

## 2020-01-04 LAB — FERRITIN: Ferritin: 14 ng/mL — ABNORMAL LOW (ref 16–288)

## 2020-01-04 LAB — CBC WITH DIFFERENTIAL/PLATELET
Absolute Monocytes: 491 cells/uL (ref 200–950)
Basophils Absolute: 32 cells/uL (ref 0–200)
Basophils Relative: 0.5 %
Eosinophils Absolute: 132 cells/uL (ref 15–500)
Eosinophils Relative: 2.1 %
HCT: 33.5 % — ABNORMAL LOW (ref 35.0–45.0)
Hemoglobin: 11 g/dL — ABNORMAL LOW (ref 11.7–15.5)
Lymphs Abs: 1972 cells/uL (ref 850–3900)
MCH: 31 pg (ref 27.0–33.0)
MCHC: 32.8 g/dL (ref 32.0–36.0)
MCV: 94.4 fL (ref 80.0–100.0)
MPV: 11.2 fL (ref 7.5–12.5)
Monocytes Relative: 7.8 %
Neutro Abs: 3673 cells/uL (ref 1500–7800)
Neutrophils Relative %: 58.3 %
Platelets: 238 10*3/uL (ref 140–400)
RBC: 3.55 10*6/uL — ABNORMAL LOW (ref 3.80–5.10)
RDW: 13.8 % (ref 11.0–15.0)
Total Lymphocyte: 31.3 %
WBC: 6.3 10*3/uL (ref 3.8–10.8)

## 2020-01-04 LAB — HEMOGLOBIN A1C
Hgb A1c MFr Bld: 5.5 % of total Hgb (ref <5.7)
Mean Plasma Glucose: 111 (calc)
eAG (mmol/L): 6.2 (calc)

## 2020-01-04 MED ORDER — POTASSIUM CHLORIDE CRYS ER 10 MEQ PO TBCR: EXTENDED_RELEASE_TABLET | ORAL | 0 refills | Status: DC

## 2020-01-04 MED ORDER — FUROSEMIDE 40 MG PO TABS: ORAL_TABLET | ORAL | 6 refills | Status: DC

## 2020-01-04 NOTE — Telephone Encounter (Signed)
I refilled both lasix and potassium

## 2020-01-04 NOTE — Telephone Encounter (Signed)
Needing refill on potassium and lasix-- she was put on these while in the hospital and now she's out

## 2020-01-10 ENCOUNTER — Telehealth: Payer: Self-pay | Admitting: "Endocrinology

## 2020-01-10 DIAGNOSIS — E890 Postprocedural hypothyroidism: Secondary | ICD-10-CM

## 2020-01-10 NOTE — Telephone Encounter (Signed)
Orders sent to [facility]

## 2020-01-10 NOTE — Telephone Encounter (Signed)
Can you update lab orders?

## 2020-01-11 DIAGNOSIS — E890 Postprocedural hypothyroidism: Secondary | ICD-10-CM | POA: Diagnosis not present

## 2020-01-12 LAB — T4, FREE: Free T4: 1.4 ng/dL (ref 0.8–1.8)

## 2020-01-12 LAB — TSH: TSH: 3.58 mIU/L (ref 0.40–4.50)

## 2020-01-13 ENCOUNTER — Other Ambulatory Visit: Payer: Self-pay

## 2020-01-13 ENCOUNTER — Inpatient Hospital Stay (HOSPITAL_COMMUNITY)
Admission: EM | Admit: 2020-01-13 | Discharge: 2020-01-15 | DRG: 303 | Disposition: A | Discharge status: Home / Self Care | Payer: PPO | Attending: Family Medicine | Admitting: Family Medicine

## 2020-01-13 ENCOUNTER — Encounter (HOSPITAL_COMMUNITY): Payer: Self-pay

## 2020-01-13 ENCOUNTER — Emergency Department (HOSPITAL_COMMUNITY): Payer: PPO

## 2020-01-13 DIAGNOSIS — I2510 Atherosclerotic heart disease of native coronary artery without angina pectoris: Secondary | ICD-10-CM | POA: Diagnosis not present

## 2020-01-13 DIAGNOSIS — Z7982 Long term (current) use of aspirin: Secondary | ICD-10-CM

## 2020-01-13 DIAGNOSIS — E669 Obesity, unspecified: Secondary | ICD-10-CM | POA: Diagnosis present

## 2020-01-13 DIAGNOSIS — I5042 Chronic combined systolic (congestive) and diastolic (congestive) heart failure: Secondary | ICD-10-CM | POA: Diagnosis present

## 2020-01-13 DIAGNOSIS — R0789 Other chest pain: Secondary | ICD-10-CM | POA: Diagnosis not present

## 2020-01-13 DIAGNOSIS — Z90710 Acquired absence of both cervix and uterus: Secondary | ICD-10-CM | POA: Diagnosis not present

## 2020-01-13 DIAGNOSIS — Z79899 Other long term (current) drug therapy: Secondary | ICD-10-CM | POA: Diagnosis not present

## 2020-01-13 DIAGNOSIS — I071 Rheumatic tricuspid insufficiency: Secondary | ICD-10-CM | POA: Diagnosis present

## 2020-01-13 DIAGNOSIS — Z87891 Personal history of nicotine dependence: Secondary | ICD-10-CM

## 2020-01-13 DIAGNOSIS — E782 Mixed hyperlipidemia: Secondary | ICD-10-CM | POA: Diagnosis present

## 2020-01-13 DIAGNOSIS — I10 Essential (primary) hypertension: Secondary | ICD-10-CM | POA: Diagnosis present

## 2020-01-13 DIAGNOSIS — I252 Old myocardial infarction: Secondary | ICD-10-CM | POA: Diagnosis not present

## 2020-01-13 DIAGNOSIS — I110 Hypertensive heart disease with heart failure: Secondary | ICD-10-CM | POA: Diagnosis present

## 2020-01-13 DIAGNOSIS — Z823 Family history of stroke: Secondary | ICD-10-CM

## 2020-01-13 DIAGNOSIS — Z6841 Body Mass Index (BMI) 40.0 and over, adult: Secondary | ICD-10-CM | POA: Diagnosis not present

## 2020-01-13 DIAGNOSIS — I2089 Other forms of angina pectoris: Secondary | ICD-10-CM | POA: Diagnosis present

## 2020-01-13 DIAGNOSIS — Z880 Allergy status to penicillin: Secondary | ICD-10-CM | POA: Diagnosis not present

## 2020-01-13 DIAGNOSIS — I208 Other forms of angina pectoris: Secondary | ICD-10-CM | POA: Diagnosis present

## 2020-01-13 DIAGNOSIS — Z809 Family history of malignant neoplasm, unspecified: Secondary | ICD-10-CM

## 2020-01-13 DIAGNOSIS — E890 Postprocedural hypothyroidism: Secondary | ICD-10-CM | POA: Diagnosis not present

## 2020-01-13 DIAGNOSIS — Z885 Allergy status to narcotic agent status: Secondary | ICD-10-CM

## 2020-01-13 DIAGNOSIS — Z20822 Contact with and (suspected) exposure to covid-19: Secondary | ICD-10-CM | POA: Diagnosis present

## 2020-01-13 DIAGNOSIS — E785 Hyperlipidemia, unspecified: Secondary | ICD-10-CM | POA: Diagnosis not present

## 2020-01-13 DIAGNOSIS — Z8249 Family history of ischemic heart disease and other diseases of the circulatory system: Secondary | ICD-10-CM

## 2020-01-13 DIAGNOSIS — I340 Nonrheumatic mitral (valve) insufficiency: Secondary | ICD-10-CM | POA: Diagnosis not present

## 2020-01-13 DIAGNOSIS — Z79890 Hormone replacement therapy (postmenopausal): Secondary | ICD-10-CM

## 2020-01-13 DIAGNOSIS — I25118 Atherosclerotic heart disease of native coronary artery with other forms of angina pectoris: Principal | ICD-10-CM | POA: Diagnosis present

## 2020-01-13 DIAGNOSIS — E661 Drug-induced obesity: Secondary | ICD-10-CM | POA: Diagnosis not present

## 2020-01-13 DIAGNOSIS — R0602 Shortness of breath: Secondary | ICD-10-CM | POA: Diagnosis not present

## 2020-01-13 DIAGNOSIS — I361 Nonrheumatic tricuspid (valve) insufficiency: Secondary | ICD-10-CM | POA: Diagnosis not present

## 2020-01-13 DIAGNOSIS — Z888 Allergy status to other drugs, medicaments and biological substances status: Secondary | ICD-10-CM

## 2020-01-13 DIAGNOSIS — R0689 Other abnormalities of breathing: Secondary | ICD-10-CM | POA: Diagnosis not present

## 2020-01-13 DIAGNOSIS — R079 Chest pain, unspecified: Secondary | ICD-10-CM | POA: Diagnosis not present

## 2020-01-13 DIAGNOSIS — R072 Precordial pain: Secondary | ICD-10-CM | POA: Diagnosis not present

## 2020-01-13 DIAGNOSIS — Z9049 Acquired absence of other specified parts of digestive tract: Secondary | ICD-10-CM | POA: Diagnosis not present

## 2020-01-13 DIAGNOSIS — E559 Vitamin D deficiency, unspecified: Secondary | ICD-10-CM | POA: Diagnosis not present

## 2020-01-13 DIAGNOSIS — R457 State of emotional shock and stress, unspecified: Secondary | ICD-10-CM | POA: Diagnosis not present

## 2020-01-13 LAB — CBC WITH DIFFERENTIAL/PLATELET
Abs Immature Granulocytes: 0.04 10*3/uL (ref 0.00–0.07)
Basophils Absolute: 0 10*3/uL (ref 0.0–0.1)
Basophils Relative: 0 %
Eosinophils Absolute: 0.1 10*3/uL (ref 0.0–0.5)
Eosinophils Relative: 1 %
HCT: 36.1 % (ref 36.0–46.0)
Hemoglobin: 11.6 g/dL — ABNORMAL LOW (ref 12.0–15.0)
Immature Granulocytes: 0 %
Lymphocytes Relative: 3 %
Lymphs Abs: 0.3 10*3/uL — ABNORMAL LOW (ref 0.7–4.0)
MCH: 31.3 pg (ref 26.0–34.0)
MCHC: 32.1 g/dL (ref 30.0–36.0)
MCV: 97.3 fL (ref 80.0–100.0)
Monocytes Absolute: 0.3 10*3/uL (ref 0.1–1.0)
Monocytes Relative: 4 %
Neutro Abs: 8.6 10*3/uL — ABNORMAL HIGH (ref 1.7–7.7)
Neutrophils Relative %: 92 %
Platelets: 235 10*3/uL (ref 150–400)
RBC: 3.71 MIL/uL — ABNORMAL LOW (ref 3.87–5.11)
RDW: 15.7 % — ABNORMAL HIGH (ref 11.5–15.5)
WBC: 9.3 10*3/uL (ref 4.0–10.5)
nRBC: 0 % (ref 0.0–0.2)

## 2020-01-13 LAB — COMPREHENSIVE METABOLIC PANEL
ALT: 22 U/L (ref 0–44)
AST: 24 U/L (ref 15–41)
Albumin: 3.7 g/dL (ref 3.5–5.0)
Alkaline Phosphatase: 85 U/L (ref 38–126)
Anion gap: 10 (ref 5–15)
BUN: 21 mg/dL (ref 8–23)
CO2: 26 mmol/L (ref 22–32)
Calcium: 8.8 mg/dL — ABNORMAL LOW (ref 8.9–10.3)
Chloride: 102 mmol/L (ref 98–111)
Creatinine, Ser: 1.19 mg/dL — ABNORMAL HIGH (ref 0.44–1.00)
GFR calc Af Amer: 55 mL/min — ABNORMAL LOW (ref >60)
GFR calc non Af Amer: 48 mL/min — ABNORMAL LOW (ref >60)
Glucose, Bld: 94 mg/dL (ref 70–99)
Potassium: 3.7 mmol/L (ref 3.5–5.1)
Sodium: 138 mmol/L (ref 135–145)
Total Bilirubin: 0.6 mg/dL (ref 0.3–1.2)
Total Protein: 8.1 g/dL (ref 6.5–8.1)

## 2020-01-13 LAB — RESPIRATORY PANEL BY RT PCR (FLU A&B, COVID)
Influenza A by PCR: NEGATIVE
Influenza B by PCR: NEGATIVE
SARS Coronavirus 2 by RT PCR: NEGATIVE

## 2020-01-13 LAB — D-DIMER, QUANTITATIVE: D-Dimer, Quant: 0.59 ug/mL-FEU — ABNORMAL HIGH (ref 0.00–0.50)

## 2020-01-13 LAB — TROPONIN I (HIGH SENSITIVITY): Troponin I (High Sensitivity): 4 ng/L (ref <18)

## 2020-01-13 MED ORDER — MORPHINE SULFATE (PF) 4 MG/ML IV SOLN
4.0000 mg | Freq: Once | INTRAVENOUS | Status: AC
  Administered 2020-01-13: 22:00:00 4 mg via INTRAVENOUS
  Filled 2020-01-13: qty 1

## 2020-01-13 MED ORDER — ONDANSETRON HCL 4 MG/2ML IJ SOLN
4.0000 mg | Freq: Once | INTRAMUSCULAR | Status: AC
  Administered 2020-01-13: 22:00:00 4 mg via INTRAVENOUS
  Filled 2020-01-13: qty 2

## 2020-01-13 NOTE — ED Notes (Signed)
Lab in room.

## 2020-01-13 NOTE — ED Triage Notes (Signed)
Pt states she was playing bingo when she had onset of chest heaviness and sharp pain that is worse with deep breath.  Pt took one baby aspirin and one ntg without relief.   Pt was given 3 more baby aspirin by ems .  Pt rates pain 8/10 on arrival.

## 2020-01-13 NOTE — ED Provider Notes (Signed)
[facility] EMERGENCY DEPARTMENT Provider Note   CSN: [id number] Arrival date & time: 01/13/20  2126     History Chief Complaint  Patient presents with  . Chest Pain    Maria Fry is a 69 y.o. female.  Patient with history of coronary disease.  She was playing bingo and started having some chest pressure.  This started around 730 this evening.  Patient still had pain coming to the emergency department  The history is provided by the patient. No language interpreter was used.  Chest Pain Pain location:  Substernal area Pain quality: aching   Pain radiates to:  Does not radiate Pain severity:  Moderate Onset quality:  Sudden Timing:  Constant Associated symptoms: no abdominal pain, no back pain, no cough, no fatigue and no headache        Past Medical History:  Diagnosis Date  . CAD (coronary artery disease)    a. 06/07/15 NSTEMI/Cath: Dist LAD 100%, mid LAD 10%. Otw nl cors. Nl EF w/ inferoapical AK.  Marland Kitchen Chronic combined systolic (congestive) and diastolic (congestive) heart failure ([place]) 04/23/2016   a. 04/2016 Echo: EF 45%, Gr2 DD; b. 11/2019 Echo: EF 45-50%, mod LVH. Gr2 DD. Nl RV fxn. Sev dil LA.   . Essential hypertension   . Hip pain   . Hyperlipidemia   . Hypothyroidism   . MI (myocardial infarction) ([place]) 06/05/2015  . Vitamin D deficiency     Patient Active Problem List   Diagnosis Date Noted  . Abnormal nuclear stress test 12/08/2019  . Hypokalemia 11/28/2019  . Anemia 11/28/2019  . Hyperlipidemia 11/27/2019  . Peripheral edema 05/18/2019  . OA (osteoarthritis) of knee 05/18/2019  . DDD (degenerative disc disease), lumbar 05/18/2019  . Obesity 12/26/2017  . Seasonal allergies 08/19/2016  . Glucose intolerance (impaired glucose tolerance) 05/14/2016  . Chronic hip pain 05/14/2016  . Chronic combined systolic and diastolic heart failure ([place]) 05/14/2016  . Hypothyroidism following radioiodine therapy 11/30/2015  . CAD (coronary artery disease)   .  NSTEMI (non-ST elevated myocardial infarction) ([place])   . Hypertension 06/05/2015    Past Surgical History:  Procedure Laterality Date  . ABDOMINAL HYSTERECTOMY    . CARDIAC CATHETERIZATION N/A 06/06/2015   Procedure: Left Heart Cath and Coronary Angiography;  Surgeon: Peter M Martinique, MD;  Location: [place] CV LAB;  Service: Cardiovascular;  Laterality: N/A;  . CHOLECYSTECTOMY    . COLONOSCOPY    . COLONOSCOPY N/A 08/13/2017   Procedure: COLONOSCOPY;  Surgeon: Daneil Dolin, MD;  [facility];  Service: Endoscopy;  Laterality: N/A;  8:30 AM  . ESOPHAGOGASTRODUODENOSCOPY N/A 09/11/2016   Procedure: ESOPHAGOGASTRODUODENOSCOPY (EGD);  Surgeon: Daneil Dolin, MD;  Location[facility];  Service: Endoscopy;  Laterality: N/A;  7:45 am - moved to 10/11 @ 10:30  . Heel spurs    . MALONEY DILATION N/A 09/11/2016   Procedure: Venia Minks DILATION;  Surgeon: Daneil Dolin, MD;  Location: [facility];  Service: Endoscopy;  Laterality: N/A;  . POLYPECTOMY  08/13/2017   Procedure: POLYPECTOMY;  Surgeon: Daneil Dolin, MD;  [facility];  Service: Endoscopy;;  colon  . SHOULDER ARTHROSCOPY    . TUBAL LIGATION       OB History    Gravida  3   Para  3   Term  3   Preterm      AB      Living        SAB  TAB      Ectopic      Multiple      Live Births              Family History  Problem Relation Age of Onset  . Heart failure Father        Deceased  . Heart attack Father        Deceased  . Stroke Sister        Deceased  . Heart failure Brother        Deceased  . Cancer Brother        Deceased  . Heart attack Brother        Deceased  . Colon cancer Neg Hx     Social History   Tobacco Use  . Smoking status: Former Smoker    Types: Cigarettes    Start date: 12/02/1977    Quit date: 12/02/2002    Years since quitting: 17.1  . Smokeless tobacco: Never Used  Substance Use Topics  . Alcohol use: No    Alcohol/week: 0.0 standard drinks   . Drug use: No    Home Medications Prior to Admission medications   Medication Sig Start Date End Date Taking? Authorizing Provider  acetaminophen (TYLENOL) 500 MG tablet Take 500 mg by mouth every 6 (six) hours as needed for mild pain or moderate pain. Reported on 04/22/2016    [provider]  aspirin 81 MG chewable tablet Chew 1 tablet (81 mg total) by mouth daily. 06/08/15   Bhagat, Crista Luria, PA  atorvastatin (LIPITOR) 80 MG tablet Take 1 tablet (80 mg total) by mouth at bedtime. 12/15/18   [name], MD  furosemide (LASIX) 40 MG tablet Take 40 mg daily 4 days/week (Tuesday, Thursday, Saturday, Sunday) and take 40 mg twice daily 3 days/week (Monday, Wednesday and Friday) 01/04/20   [name], MD  gabapentin (NEURONTIN) 300 MG capsule Take 300 mg by mouth at bedtime.     [provider]  levothyroxine (SYNTHROID) 100 MCG tablet TAKE 1 TABLET BY MOUTH ONCE DAILY BEFORE BREAKFAST 07/06/19   Cassandria Anger, MD  loratadine (CLARITIN) 10 MG tablet Take 10 mg by mouth daily as needed for allergies.    [provider]  metoprolol tartrate (LOPRESSOR) 25 MG tablet Take 1 tablet by mouth twice daily Patient taking differently: Take 25 mg by mouth 2 (two) times daily.  11/15/19   [name], MD  nitroGLYCERIN (NITROSTAT) 0.4 MG SL tablet DISSOLVE ONE TABLET UNDER THE TONGUE EVERY 5 MINUTES AS NEEDED FOR CHEST PAIN.  DO NOT EXCEED A TOTAL OF 3 DOSES IN 15 MINUTES 12/15/18   [name], MD  potassium chloride (KLOR-CON) 10 MEQ tablet Take 1 tablet (10 meq) 4 days/week (Tuesday, Thursday, Saturday, Sunday) and take 2 tablets (20 meq) 3 days/week (Monday, Wednesday, Friday) 01/04/20   [name], MD  RANEXA 1000 MG SR tablet Take 1 tablet by mouth twice daily Patient taking differently: Take 1,000 mg by mouth 2 (two) times daily.  12/06/19   [name], MD    Allergies    Vicodin [hydrocodone-acetaminophen],  Amoxicillin, Demerol [meperidine], Lisinopril, and Lodine [etodolac]  Review of Systems   Review of Systems  Constitutional: Negative for appetite change and fatigue.  HENT: Negative for congestion, ear discharge and sinus pressure.   Eyes: Negative for discharge.  Respiratory: Negative for cough.   Cardiovascular: Positive for chest pain.  Gastrointestinal: Negative for abdominal pain and diarrhea.  Genitourinary: Negative for frequency and hematuria.  Musculoskeletal: Negative for back pain.  Skin: Negative for rash.  Neurological: Negative for seizures and headaches.  Psychiatric/Behavioral: Negative for hallucinations.    Physical Exam Updated Vital Signs BP (!) 168/87   Pulse 81   Temp 99.4 F (37.4 C) (Oral)   Resp 17   Ht 5' (1.524 m)   Wt 97.5 kg   SpO2 95%   BMI 41.99 kg/m   Physical Exam Vitals and nursing note reviewed.  Constitutional:      Appearance: She is well-developed.  HENT:     Head: Normocephalic.     Nose: Nose normal.  Eyes:     General: No scleral icterus.    Conjunctiva/sclera: Conjunctivae normal.  Neck:     Thyroid: No thyromegaly.  Cardiovascular:     Rate and Rhythm: Normal rate and regular rhythm.     Heart sounds: No murmur. No friction rub. No gallop.   Pulmonary:     Breath sounds: No stridor. No wheezing or rales.  Chest:     Chest wall: No tenderness.  Abdominal:     General: There is no distension.     Tenderness: There is no abdominal tenderness. There is no rebound.  Musculoskeletal:        General: Normal range of motion.     Cervical back: Neck supple.  Lymphadenopathy:     Cervical: No cervical adenopathy.  Skin:    Findings: No erythema or rash.  Neurological:     Mental Status: She is oriented to person, place, and time.     Motor: No abnormal muscle tone.     Coordination: Coordination normal.  Psychiatric:        Behavior: Behavior normal.     ED Results / Procedures / Treatments   Labs (all labs  ordered are listed, but only abnormal results are displayed) Labs Reviewed  CBC WITH DIFFERENTIAL/PLATELET - Abnormal; Notable for the following components:      Result Value   RBC 3.71 (*)    Hemoglobin 11.6 (*)    RDW 15.7 (*)    Neutro Abs 8.6 (*)    Lymphs Abs 0.3 (*)    All other components within normal limits  COMPREHENSIVE METABOLIC PANEL - Abnormal; Notable for the following components:   Creatinine, Ser 1.19 (*)    Calcium 8.8 (*)    GFR calc non Af Amer 48 (*)    GFR calc Af Amer 55 (*)    All other components within normal limits  D-DIMER, QUANTITATIVE (NOT AT [facility]) - Abnormal; Notable for the following components:   D-Dimer, Quant 0.59 (*)    All other components within normal limits  RESPIRATORY PANEL BY RT PCR (FLU A&B, COVID)  TROPONIN I (HIGH SENSITIVITY)  TROPONIN I (HIGH SENSITIVITY)    EKG None  Radiology DG Chest Portable 1 View  Result Date: 01/13/2020 CLINICAL DATA:  Shortness of breath EXAM: PORTABLE CHEST 1 VIEW COMPARISON:  11/28/2019 FINDINGS: Cardiomegaly. Lungs are clear. No effusions. No acute bony abnormality. IMPRESSION: Cardiomegaly.  No acute cardiopulmonary disease. Electronically Signed   By: Rolm Baptise M.D.   On: 01/13/2020 22:23    Procedures Procedures (including critical care time)  Medications Ordered in ED Medications  morphine 4 MG/ML injection 4 mg (4 mg Intravenous Given 01/13/20 2219)  ondansetron (ZOFRAN) injection 4 mg (4 mg Intravenous Given 01/13/20 2228)    ED Course  I have reviewed the triage vital signs and the nursing  notes.  Pertinent labs & imaging results that were available during my care of the patient were reviewed by me and considered in my medical decision making (see chart for details).    MDM Rules/Calculators/A&P                      EKG shows nonspecific changes.  First troponin was negative.   With chest pain.  Troponins pending.  Patient was left with Dr. Waverly Ferrari and eventually she was admitted for  chest pain work-up Final Clinical Impression(s) / ED Diagnoses Final diagnoses:  None    Rx / DC Orders ED Discharge Orders    None       Milton Ferguson, MD 01/14/20 [phone]

## 2020-01-14 ENCOUNTER — Inpatient Hospital Stay (HOSPITAL_COMMUNITY): Payer: PPO

## 2020-01-14 DIAGNOSIS — E890 Postprocedural hypothyroidism: Secondary | ICD-10-CM | POA: Diagnosis present

## 2020-01-14 DIAGNOSIS — I252 Old myocardial infarction: Secondary | ICD-10-CM | POA: Diagnosis not present

## 2020-01-14 DIAGNOSIS — Z809 Family history of malignant neoplasm, unspecified: Secondary | ICD-10-CM | POA: Diagnosis not present

## 2020-01-14 DIAGNOSIS — I10 Essential (primary) hypertension: Secondary | ICD-10-CM

## 2020-01-14 DIAGNOSIS — I071 Rheumatic tricuspid insufficiency: Secondary | ICD-10-CM | POA: Diagnosis present

## 2020-01-14 DIAGNOSIS — Z79899 Other long term (current) drug therapy: Secondary | ICD-10-CM | POA: Diagnosis not present

## 2020-01-14 DIAGNOSIS — Z880 Allergy status to penicillin: Secondary | ICD-10-CM | POA: Diagnosis not present

## 2020-01-14 DIAGNOSIS — Z9049 Acquired absence of other specified parts of digestive tract: Secondary | ICD-10-CM | POA: Diagnosis not present

## 2020-01-14 DIAGNOSIS — E661 Drug-induced obesity: Secondary | ICD-10-CM

## 2020-01-14 DIAGNOSIS — Z90710 Acquired absence of both cervix and uterus: Secondary | ICD-10-CM | POA: Diagnosis not present

## 2020-01-14 DIAGNOSIS — I2510 Atherosclerotic heart disease of native coronary artery without angina pectoris: Secondary | ICD-10-CM

## 2020-01-14 DIAGNOSIS — Z6841 Body Mass Index (BMI) 40.0 and over, adult: Secondary | ICD-10-CM

## 2020-01-14 DIAGNOSIS — E785 Hyperlipidemia, unspecified: Secondary | ICD-10-CM

## 2020-01-14 DIAGNOSIS — I5042 Chronic combined systolic (congestive) and diastolic (congestive) heart failure: Secondary | ICD-10-CM | POA: Diagnosis present

## 2020-01-14 DIAGNOSIS — I25118 Atherosclerotic heart disease of native coronary artery with other forms of angina pectoris: Secondary | ICD-10-CM | POA: Diagnosis present

## 2020-01-14 DIAGNOSIS — I340 Nonrheumatic mitral (valve) insufficiency: Secondary | ICD-10-CM

## 2020-01-14 DIAGNOSIS — Z7982 Long term (current) use of aspirin: Secondary | ICD-10-CM | POA: Diagnosis not present

## 2020-01-14 DIAGNOSIS — R0789 Other chest pain: Secondary | ICD-10-CM | POA: Diagnosis present

## 2020-01-14 DIAGNOSIS — Z823 Family history of stroke: Secondary | ICD-10-CM | POA: Diagnosis not present

## 2020-01-14 DIAGNOSIS — I208 Other forms of angina pectoris: Secondary | ICD-10-CM | POA: Diagnosis present

## 2020-01-14 DIAGNOSIS — Z8249 Family history of ischemic heart disease and other diseases of the circulatory system: Secondary | ICD-10-CM | POA: Diagnosis not present

## 2020-01-14 DIAGNOSIS — I361 Nonrheumatic tricuspid (valve) insufficiency: Secondary | ICD-10-CM

## 2020-01-14 DIAGNOSIS — E669 Obesity, unspecified: Secondary | ICD-10-CM | POA: Diagnosis present

## 2020-01-14 DIAGNOSIS — E559 Vitamin D deficiency, unspecified: Secondary | ICD-10-CM | POA: Diagnosis present

## 2020-01-14 DIAGNOSIS — Z79890 Hormone replacement therapy (postmenopausal): Secondary | ICD-10-CM | POA: Diagnosis not present

## 2020-01-14 DIAGNOSIS — R079 Chest pain, unspecified: Secondary | ICD-10-CM | POA: Diagnosis present

## 2020-01-14 DIAGNOSIS — Z885 Allergy status to narcotic agent status: Secondary | ICD-10-CM | POA: Diagnosis not present

## 2020-01-14 DIAGNOSIS — I110 Hypertensive heart disease with heart failure: Secondary | ICD-10-CM | POA: Diagnosis present

## 2020-01-14 DIAGNOSIS — Z20822 Contact with and (suspected) exposure to covid-19: Secondary | ICD-10-CM | POA: Diagnosis present

## 2020-01-14 DIAGNOSIS — Z888 Allergy status to other drugs, medicaments and biological substances status: Secondary | ICD-10-CM | POA: Diagnosis not present

## 2020-01-14 DIAGNOSIS — Z87891 Personal history of nicotine dependence: Secondary | ICD-10-CM | POA: Diagnosis not present

## 2020-01-14 LAB — COMPREHENSIVE METABOLIC PANEL
ALT: 27 U/L (ref 0–44)
AST: 33 U/L (ref 15–41)
Albumin: 3.5 g/dL (ref 3.5–5.0)
Alkaline Phosphatase: 75 U/L (ref 38–126)
Anion gap: 9 (ref 5–15)
BUN: 22 mg/dL (ref 8–23)
CO2: 27 mmol/L (ref 22–32)
Calcium: 8.9 mg/dL (ref 8.9–10.3)
Chloride: 103 mmol/L (ref 98–111)
Creatinine, Ser: 1.13 mg/dL — ABNORMAL HIGH (ref 0.44–1.00)
GFR calc Af Amer: 59 mL/min — ABNORMAL LOW (ref >60)
GFR calc non Af Amer: 51 mL/min — ABNORMAL LOW (ref >60)
Glucose, Bld: 100 mg/dL — ABNORMAL HIGH (ref 70–99)
Potassium: 3.9 mmol/L (ref 3.5–5.1)
Sodium: 139 mmol/L (ref 135–145)
Total Bilirubin: 0.3 mg/dL (ref 0.3–1.2)
Total Protein: 7.9 g/dL (ref 6.5–8.1)

## 2020-01-14 LAB — CBC
HCT: 37 % (ref 36.0–46.0)
Hemoglobin: 11.5 g/dL — ABNORMAL LOW (ref 12.0–15.0)
MCH: 30.6 pg (ref 26.0–34.0)
MCHC: 31.1 g/dL (ref 30.0–36.0)
MCV: 98.4 fL (ref 80.0–100.0)
Platelets: 218 10*3/uL (ref 150–400)
RBC: 3.76 MIL/uL — ABNORMAL LOW (ref 3.87–5.11)
RDW: 16 % — ABNORMAL HIGH (ref 11.5–15.5)
WBC: 7.2 10*3/uL (ref 4.0–10.5)
nRBC: 0 % (ref 0.0–0.2)

## 2020-01-14 LAB — BRAIN NATRIURETIC PEPTIDE: B Natriuretic Peptide: 556 pg/mL — ABNORMAL HIGH (ref 0.0–100.0)

## 2020-01-14 LAB — PHOSPHORUS: Phosphorus: 3.6 mg/dL (ref 2.5–4.6)

## 2020-01-14 LAB — ECHOCARDIOGRAM COMPLETE
Height: 60 in
Weight: 3432.12 oz

## 2020-01-14 LAB — MAGNESIUM: Magnesium: 1.9 mg/dL (ref 1.7–2.4)

## 2020-01-14 LAB — TROPONIN I (HIGH SENSITIVITY)
Troponin I (High Sensitivity): 13 ng/L (ref <18)
Troponin I (High Sensitivity): 6 ng/L (ref <18)
Troponin I (High Sensitivity): 9 ng/L (ref <18)

## 2020-01-14 MED ORDER — ASPIRIN EC 81 MG PO TBEC
81.0000 mg | DELAYED_RELEASE_TABLET | Freq: Every day | ORAL | Status: DC
  Administered 2020-01-14 – 2020-01-15 (×2): 81 mg via ORAL
  Filled 2020-01-14 (×2): qty 1

## 2020-01-14 MED ORDER — IOHEXOL 350 MG/ML SOLN
100.0000 mL | Freq: Once | INTRAVENOUS | Status: AC | PRN
  Administered 2020-01-14: 100 mL via INTRAVENOUS

## 2020-01-14 MED ORDER — ENOXAPARIN SODIUM 40 MG/0.4ML ~~LOC~~ SOLN
40.0000 mg | SUBCUTANEOUS | Status: DC
  Administered 2020-01-14: 40 mg via SUBCUTANEOUS
  Filled 2020-01-14 (×2): qty 0.4

## 2020-01-14 MED ORDER — NITROGLYCERIN 0.4 MG SL SUBL: 0.4000 mg | SUBLINGUAL_TABLET | SUBLINGUAL | Status: DC | PRN

## 2020-01-14 MED ORDER — ATORVASTATIN CALCIUM 40 MG PO TABS
80.0000 mg | ORAL_TABLET | Freq: Every day | ORAL | Status: DC
  Administered 2020-01-14: 80 mg via ORAL
  Filled 2020-01-14: qty 2

## 2020-01-14 MED ORDER — METOPROLOL TARTRATE 50 MG PO TABS
25.0000 mg | ORAL_TABLET | Freq: Two times a day (BID) | ORAL | Status: DC
  Administered 2020-01-14 – 2020-01-15 (×3): 25 mg via ORAL
  Filled 2020-01-14 (×3): qty 1

## 2020-01-14 MED ORDER — RANOLAZINE ER 500 MG PO TB12
1000.0000 mg | ORAL_TABLET | Freq: Two times a day (BID) | ORAL | Status: DC
  Administered 2020-01-14 – 2020-01-15 (×3): 1000 mg via ORAL
  Filled 2020-01-14 (×3): qty 2

## 2020-01-14 MED ORDER — LEVOTHYROXINE SODIUM 100 MCG PO TABS
100.0000 ug | ORAL_TABLET | Freq: Every day | ORAL | Status: DC
  Administered 2020-01-14 – 2020-01-15 (×2): 100 ug via ORAL
  Filled 2020-01-14 (×2): qty 1

## 2020-01-14 MED ORDER — ONDANSETRON HCL 4 MG/2ML IJ SOLN: 4.0000 mg | Freq: Four times a day (QID) | INTRAMUSCULAR | Status: DC | PRN

## 2020-01-14 NOTE — Progress Notes (Signed)
*  PRELIMINARY RESULTS* Echocardiogram 2D Echocardiogram has been performed.  Samuel Germany 01/14/2020, 12:57 PM

## 2020-01-14 NOTE — Progress Notes (Addendum)
Patient admitted this morning for chest pain.  She has previous history of CAD with NSTEMI in Q000111Q, chronic systolic CHF with EF [id number].  Started having substernal chest pain yesterday.  Currently chest pain-free  Vital signs are stable.  Normal sinus rhythm, clear to auscultation bilaterally.  Abdomen is nontender nondistended.  EKG shows possible inferior lateral ST changes/depression.  Given her current risk factors, previous cardiac history patient will benefit from cardiac consultation which has been requested.  Echo ordered. Elevated D Dimer, will order CTA chest.  Further medication adjustments per their team.  Troponins remain negative.  Time Spent 20 mins Gerlean Ren MD

## 2020-01-14 NOTE — Consult Note (Signed)
Cardiology Consultation:   Patient ID: Maria Fry MRN: [id number]; DOB: Oct 19, 1951  Admit date: 01/13/2020 Date of Consult: 01/14/2020  Primary Care Provider: Alycia Rossetti, MD Primary Cardiologist: Kate Sable, MD Primary Electrophysiologist:  None    Patient Profile:   Maria Fry is a 69 y.o. female with a hx of CAD and chronic combined systolic/diastoliC HF who is being seen today for the evaluation of chest pain at the request of Dr. Reesa Chew  History of Present Illness:   Maria Fry is a 69 yo female history of CAD with NSTEMI 2016 with medically managed distal LAD disease, chronic combined systolic/diastolic HF last LVEF Q000111Q, HL presents with chest pain.  Sudden onset while playing bingo around 730pm. Sharp/pressure 8/10 midchest radiating to back between shoulder blades, down into right arm. Constant pain lasting 3 hours, worst with position and palpation. Not better with NG. Improvedin ER with IV morphine and zofran. Associated SOB, felt cold all over.    Admission 11/2019 with HF exacerbation. Trop elevation at that time but no chest pains, nuclear stress with just apical scar. Diuresed, discharge weight reproted 211 lbs   WBC 9.3 Hgb 11.6 Plt 235 K 3.7 Cr 1.19 Ddimer 0.59 hstrop 4-->6-->9 COVID neg flu neg CXR cardiomegaly, no acute process EKG SR, inferior and lateral precordial ST depressions. Lateral precordial changes more prominent, inferior changes look new. Has some aVR elevation  2016 cath very distal LAD occluded, mid LAD 10%. Otherwise no significant disease.  11/2019 echo LVEF 45-50%, grade II diastolic dysfunction [id number] nuclear stress: small apical scar     Heart Pathway Score:  HEAR Score: 6  Past Medical History:  Diagnosis Date  . CAD (coronary artery disease)    a. 06/07/15 NSTEMI/Cath: Dist LAD 100%, mid LAD 10%. Otw nl cors. Nl EF w/ inferoapical AK.  Marland Kitchen Chronic combined systolic (congestive) and diastolic (congestive) heart failure  ([place]) 04/23/2016   a. 04/2016 Echo: EF 45%, Gr2 DD; b. 11/2019 Echo: EF 45-50%, mod LVH. Gr2 DD. Nl RV fxn. Sev dil LA.   . Essential hypertension   . Hip pain   . Hyperlipidemia   . Hypothyroidism   . MI (myocardial infarction) ([place]) 06/05/2015  . Vitamin D deficiency     Past Surgical History:  Procedure Laterality Date  . ABDOMINAL HYSTERECTOMY    . CARDIAC CATHETERIZATION N/A 06/06/2015   Procedure: Left Heart Cath and Coronary Angiography;  Surgeon: Peter M Martinique, MD;  Location: [place] CV LAB;  Service: Cardiovascular;  Laterality: N/A;  . CHOLECYSTECTOMY    . COLONOSCOPY    . COLONOSCOPY N/A 08/13/2017   Procedure: COLONOSCOPY;  Surgeon: Daneil Dolin, MD;  [facility];  Service: Endoscopy;  Laterality: N/A;  8:30 AM  . ESOPHAGOGASTRODUODENOSCOPY N/A 09/11/2016   Procedure: ESOPHAGOGASTRODUODENOSCOPY (EGD);  Surgeon: Daneil Dolin, MD;  [facility];  Service: Endoscopy;  Laterality: N/A;  7:45 am - moved to 10/11 @ 10:30  . Heel spurs    . MALONEY DILATION N/A 09/11/2016   Procedure: Venia Minks DILATION;  Surgeon: Daneil Dolin, MD;  [facility];  Service: Endoscopy;  Laterality: N/A;  . POLYPECTOMY  08/13/2017   Procedure: POLYPECTOMY;  Surgeon: Daneil Dolin, MD;  [facility];  Service: Endoscopy;;  colon  . SHOULDER ARTHROSCOPY    . TUBAL LIGATION         Inpatient Medications: Scheduled Meds: . aspirin EC  81 mg Oral Daily  . atorvastatin  80 mg Oral QHS  . enoxaparin (LOVENOX) injection  40 mg Subcutaneous Q24H  . levothyroxine  100 mcg Oral Q0600  . metoprolol tartrate  25 mg Oral BID  . ranolazine  1,000 mg Oral BID   Continuous Infusions:  PRN Meds: nitroGLYCERIN, ondansetron (ZOFRAN) IV  Allergies:    Allergies  Allergen Reactions  . Vicodin [Hydrocodone-Acetaminophen] Other (See Comments)    Severe nausea and diarrhea   . Amoxicillin     edema  . Demerol [Meperidine] Nausea And Vomiting  .  Lisinopril Other (See Comments)    Throat swollen  . Lodine [Etodolac] Rash    Social History:   Social History   Socioeconomic History  . Marital status: Single    Spouse name: Not on file  . Number of children: Not on file  . Years of education: Not on file  . Highest education level: Not on file  Occupational History  . Not on file  Tobacco Use  . Smoking status: Former Smoker    Types: Cigarettes    Start date: 12/02/1977    Quit date: 12/02/2002    Years since quitting: 17.1  . Smokeless tobacco: Never Used  Substance and Sexual Activity  . Alcohol use: No    Alcohol/week: 0.0 standard drinks  . Drug use: No  . Sexual activity: Yes  Other Topics Concern  . Not on file  Social History Narrative  . Not on file   Social Determinants of Health   Financial Resource Strain:   . Difficulty of Paying Living Expenses: Not on file  Food Insecurity:   . Worried About Charity fundraiser in the Last Year: Not on file  . Ran Out of Food in the Last Year: Not on file  Transportation Needs:   . Lack of Transportation (Medical): Not on file  . Lack of Transportation (Non-Medical): Not on file  Physical Activity:   . Days of Exercise per Week: Not on file  . Minutes of Exercise per Session: Not on file  Stress:   . Feeling of Stress : Not on file  Social Connections:   . Frequency of Communication with Friends and Family: Not on file  . Frequency of Social Gatherings with Friends and Family: Not on file  . Attends Religious Services: Not on file  . Active Member of Clubs or Organizations: Not on file  . Attends Archivist Meetings: Not on file  . Marital Status: Not on file  Intimate Partner Violence:   . Fear of Current or Ex-Partner: Not on file  . Emotionally Abused: Not on file  . Physically Abused: Not on file  . Sexually Abused: Not on file    Family History:    Family History  Problem Relation Age of Onset  . Heart failure Father        Deceased  .  Heart attack Father        Deceased  . Stroke Sister        Deceased  . Heart failure Brother        Deceased  . Cancer Brother        Deceased  . Heart attack Brother        Deceased  . Colon cancer Neg Hx      ROS:  Please see the history of present illness.  All other ROS reviewed and negative.     Physical Exam/Data:   Vitals:   01/14/20 0600 01/14/20 0701 01/14/20 [id number] 01/14/20 [id number]  BP:    128/67  Pulse: 72 72 72 73  Resp: 17 20  16   Temp:    98.1 F (36.7 C)  TempSrc:    Oral  SpO2: 94% 91%  94%  Weight:      Height:       No intake or output data in the 24 hours ending 01/14/20 1051 Last 3 Weights 01/14/2020 01/13/2020 01/03/2020  Weight (lbs) 214 lb 8.1 oz 215 lb 216 lb  Weight (kg) 97.3 kg 97.523 kg 97.977 kg     Body mass index is 41.89 kg/m.  General:  Well nourished, well developed, in no acute distress HEENT: normal Lymph: no adenopathy Neck: no JVD Endocrine:  No thryomegaly Vascular: No carotid bruits; FA pulses 2+ bilaterally without bruits  Cardiac:  normal S1, S2; RRR; no murmur  Lungs:  clear to auscultation bilaterally, no wheezing, rhonchi or rales  Abd: soft, nontender, no hepatomegaly  Ext: no edema Musculoskeletal: chest wall is tender to palpation Skin: warm and dry  Neuro:  CNs 2-12 intact, no focal abnormalities noted Psych:  Normal affect     Laboratory Data:  High Sensitivity Troponin:   Recent Labs  Lab 01/13/20 2207 01/13/20 2343 01/14/20 0145  TROPONINIHS 4 6 9      Chemistry Recent Labs  Lab 01/13/20 2207 01/14/20 0437  NA 138 139  K 3.7 3.9  CL 102 103  CO2 26 27  GLUCOSE 94 100*  BUN 21 22  CREATININE 1.19* 1.13*  CALCIUM 8.8* 8.9  GFRNONAA 48* 51*  GFRAA 55* 59*  ANIONGAP 10 9    Recent Labs  Lab 01/13/20 2207 01/14/20 0437  PROT 8.1 7.9  ALBUMIN 3.7 3.5  AST 24 33  ALT 22 27  ALKPHOS 85 75  BILITOT 0.6 0.3   Hematology Recent Labs  Lab 01/13/20 2207 01/14/20 0437  WBC 9.3 7.2  RBC 3.71*  3.76*  HGB 11.6* 11.5*  HCT 36.1 37.0  MCV 97.3 98.4  MCH 31.3 30.6  MCHC 32.1 31.1  RDW 15.7* 16.0*  PLT 235 218   BNPNo results for input(s): BNP, PROBNP in the last 168 hours.  DDimer  Recent Labs  Lab 01/13/20 2222  DDIMER 0.59*     Radiology/Studies:  DG Chest Portable 1 View  Result Date: 01/13/2020 CLINICAL DATA:  Shortness of breath EXAM: PORTABLE CHEST 1 VIEW COMPARISON:  11/28/2019 FINDINGS: Cardiomegaly. Lungs are clear. No effusions. No acute bony abnormality. IMPRESSION: Cardiomegaly.  No acute cardiopulmonary disease. Electronically Signed   By: Rolm Baptise M.D.   On: 01/13/2020 22:23       HEAR Score (for undifferentiated chest pain):  HEAR Score: 6    Assessment and Plan:    1. Chest pain/History of CAD - symptoms are atypical. Lasted constant for 3 hours, worst with position. Chest wall is very tender to palpation - trops neg thus far, trivial delta. EKG SR nonspecific changes in setting of some chronic ST/T changes - repeat EKG and trop. Obtain echo. Have asked nursing to check bp's in both arms and document - if negative further cardiac workup given her elevated Ddimer would plan for CT PE.   2. Chronic systolic/diastolic HF - appears euvolemic, continue home regimen.       For questions or updates, please contact [place] Please consult [email or web address] for contact info under     Signed, Carlyle Dolly, MD  01/14/2020 10:51 AM

## 2020-01-14 NOTE — Discharge Instructions (Addendum)
1)Avoid ibuprofen/Advil/Aleve/Motrin/Goody Powders/Naproxen/BC powders/Meloxicam/Diclofenac/Indomethacin and other Nonsteroidal anti-inflammatory medications as these will make you more likely to bleed and can cause stomach ulcers, can also cause Kidney problems.   2) please continue aspirin with food, atorvastatin/Lipitor and metoprolol for your heart  3) please follow-up with your cardiologist Dr. Bronson Ing in 1 to 2 weeks for recheck and reevaluation

## 2020-01-14 NOTE — H&P (Signed)
History and Physical  Maria Fry [id number] DOB: [birth year] DOA: 01/13/2020  Referring physician: Joseph Berkshire PCP: Alycia Rossetti, MD  Patient coming from: Home  Chief Complaint:   HPI: Maria Fry is a 69 y.o. female with medical history significant for CAD, chronic combined systolic and diastolic heart failure ([id number]), essential hypertension, hyperlipidemia, hypothyroidism and vitamin D deficiency who presents to the emergency department due to sharp stabbing midsternal chest pain with radiation to the back which started around 7:30 PM yesterday while playing bingo, pain was of moderate intensity and patient returned home due to chest discomfort.  On arrival at home, patient states that she took a sublingual nitroglycerin and 1 dose of baby aspirin without much relief, EMS was activated, and 3 baby aspirin were provided in route to the hospital.  Patient still had chest pain on arrival at the ED.  She denies diaphoresis, nausea, vomiting or abdominal pain.  ED Course:  In the emergency department, BP was 168/87 and other vital signs were within normal range.  Work-up in the ED showed normal CBC and BMP. SARS coronavirus 2, influenza A and B were negative.  High-sensitivity troponin I 4 > 9. chest x-ray showed cardiomegaly with no acute cardiopulmonary disease.  IV morphine and IV Zofran were given with improvement in chest pain.  Hospitalist was asked to admit patient for further evaluation and management.  Review of Systems: Constitutional: Negative for chills and fever.  HENT: Negative for ear pain and sore throat.   Eyes: Negative for pain and visual disturbance.  Respiratory: Negative for cough, chest tightness and shortness of breath.   Cardiovascular: Positive for chest pain.  Negative for diaphoresis and palpitations.  Gastrointestinal: Negative for abdominal pain and vomiting.  Endocrine: Negative for polyphagia and polyuria.  Genitourinary: Negative for decreased  urine volume, dysuria, enuresis, hematuria, vaginal discharge and vaginal pain.  Musculoskeletal: Negative for arthralgias and back pain.  Skin: Negative for color change and rash.  Allergic/Immunologic: Negative for immunocompromised state.  Neurological: Negative for tremors, syncope, speech difficulty, weakness, light-headedness and headaches.  Hematological: Does not bruise/bleed easily.  All other systems reviewed and are negative   Past Medical History:  Diagnosis Date  . CAD (coronary artery disease)    a. 06/07/15 NSTEMI/Cath: Dist LAD 100%, mid LAD 10%. Otw nl cors. Nl EF w/ inferoapical AK.  Marland Kitchen Chronic combined systolic (congestive) and diastolic (congestive) heart failure ([place]) 04/23/2016   a. 04/2016 Echo: EF 45%, Gr2 DD; b. 11/2019 Echo: EF 45-50%, mod LVH. Gr2 DD. Nl RV fxn. Sev dil LA.   . Essential hypertension   . Hip pain   . Hyperlipidemia   . Hypothyroidism   . MI (myocardial infarction) ([place]) 06/05/2015  . Vitamin D deficiency    Past Surgical History:  Procedure Laterality Date  . ABDOMINAL HYSTERECTOMY    . CARDIAC CATHETERIZATION N/A 06/06/2015   Procedure: Left Heart Cath and Coronary Angiography;  Surgeon: Peter M Martinique, MD;  Location: [facility];  Service: Cardiovascular;  Laterality: N/A;  . CHOLECYSTECTOMY    . COLONOSCOPY    . COLONOSCOPY N/A 08/13/2017   Procedure: COLONOSCOPY;  Surgeon: Daneil Dolin, MD;  Location: [facility];  Service: Endoscopy;  Laterality: N/A;  8:30 AM  . ESOPHAGOGASTRODUODENOSCOPY N/A 09/11/2016   Procedure: ESOPHAGOGASTRODUODENOSCOPY (EGD);  Surgeon: Daneil Dolin, MD;  Location: [facility];  Service: Endoscopy;  Laterality: N/A;  7:45 am - moved to 10/11 @ 10:30  . Heel spurs    .  MALONEY DILATION N/A 09/11/2016   Procedure: Venia Minks DILATION;  Surgeon: Daneil Dolin, MD;  Location: [facility];  Service: Endoscopy;  Laterality: N/A;  . POLYPECTOMY  08/13/2017   Procedure: POLYPECTOMY;  Surgeon: Daneil Dolin, MD;  Location: [facility];  Service: Endoscopy;;  colon  . SHOULDER ARTHROSCOPY    . TUBAL LIGATION      Social History:  reports that she quit smoking about 17 years ago. Her smoking use included cigarettes. She started smoking about 42 years ago. She has never used smokeless tobacco. She reports that she does not drink alcohol or use drugs.   Allergies  Allergen Reactions  . Vicodin [Hydrocodone-Acetaminophen] Other (See Comments)    Severe nausea and diarrhea   . Amoxicillin     edema  . Demerol [Meperidine] Nausea And Vomiting  . Lisinopril Other (See Comments)    Throat swollen  . Lodine [Etodolac] Rash    Family History  Problem Relation Age of Onset  . Heart failure Father        Deceased  . Heart attack Father        Deceased  . Stroke Sister        Deceased  . Heart failure Brother        Deceased  . Cancer Brother        Deceased  . Heart attack Brother        Deceased  . Colon cancer Neg Hx       Prior to Admission medications   Medication Sig Start Date End Date Taking? Authorizing Provider  acetaminophen (TYLENOL) 500 MG tablet Take 500 mg by mouth every 6 (six) hours as needed for mild pain or moderate pain. Reported on 04/22/2016    [provider]  aspirin 81 MG chewable tablet Chew 1 tablet (81 mg total) by mouth daily. 06/08/15   Bhagat, Crista Luria, PA  atorvastatin (LIPITOR) 80 MG tablet Take 1 tablet (80 mg total) by mouth at bedtime. 12/15/18   [name], MD  furosemide (LASIX) 40 MG tablet Take 40 mg daily 4 days/week (Tuesday, Thursday, Saturday, Sunday) and take 40 mg twice daily 3 days/week (Monday, Wednesday and Friday) 01/04/20   [name], MD  gabapentin (NEURONTIN) 300 MG capsule Take 300 mg by mouth at bedtime.     [provider]  levothyroxine (SYNTHROID) 100 MCG tablet TAKE 1 TABLET BY MOUTH ONCE DAILY BEFORE BREAKFAST 07/06/19   Cassandria Anger, MD  loratadine (CLARITIN) 10 MG tablet Take 10  mg by mouth daily as needed for allergies.    [provider]  metoprolol tartrate (LOPRESSOR) 25 MG tablet Take 1 tablet by mouth twice daily Patient taking differently: Take 25 mg by mouth 2 (two) times daily.  11/15/19   [name], MD  nitroGLYCERIN (NITROSTAT) 0.4 MG SL tablet DISSOLVE ONE TABLET UNDER THE TONGUE EVERY 5 MINUTES AS NEEDED FOR CHEST PAIN.  DO NOT EXCEED A TOTAL OF 3 DOSES IN 15 MINUTES 12/15/18   [name], MD  potassium chloride (KLOR-CON) 10 MEQ tablet Take 1 tablet (10 meq) 4 days/week (Tuesday, Thursday, Saturday, Sunday) and take 2 tablets (20 meq) 3 days/week (Monday, Wednesday, Friday) 01/04/20   [name], MD  RANEXA 1000 MG SR tablet Take 1 tablet by mouth twice daily Patient taking differently: Take 1,000 mg by mouth 2 (two) times daily.  12/06/19   [name], MD    Physical Exam: BP Marland Kitchen)  145/67 (BP Location: Left Arm)   Pulse 70   Temp 98.1 F (36.7 C) (Oral)   Resp 16   Ht 5' (1.524 m)   Wt 97.3 kg   SpO2 99%   BMI 41.89 kg/m   . General: 69 y.o. year-old female well developed well nourished in no acute distress.  Alert and oriented x3. . Cardiovascular: Regular rate and rhythm with no rubs or gallops.  No thyromegaly or JVD noted.  No lower extremity edema. 2/4 pulses in all 4 extremities. Marland Kitchen Respiratory: Clear to auscultation with no wheezes or rales. Good inspiratory effort. . Abdomen: Soft nontender nondistended with normal bowel sounds x4 quadrants. . Muskuloskeletal: No cyanosis, clubbing or edema noted bilaterally . Neuro: CN II-XII intact, strength, sensation, reflexes . Skin: No ulcerative lesions noted or rashes . Psychiatry: Judgement and insight appear normal. Mood is appropriate for condition and setting          Labs on Admission:  Basic Metabolic Panel: Recent Labs  Lab 01/13/20 2207 01/14/20 0437  NA 138 139  K 3.7 3.9  CL 102 103  CO2 26 27  GLUCOSE 94 100*  BUN 21 22  CREATININE  1.19* 1.13*  CALCIUM 8.8* 8.9  MG  --  1.9  PHOS  --  3.6   Liver Function Tests: Recent Labs  Lab 01/13/20 2207 01/14/20 0437  AST 24 33  ALT 22 27  ALKPHOS 85 75  BILITOT 0.6 0.3  PROT 8.1 7.9  ALBUMIN 3.7 3.5   No results for input(s): LIPASE, AMYLASE in the last 168 hours. No results for input(s): AMMONIA in the last 168 hours. CBC: Recent Labs  Lab 01/13/20 2207 01/14/20 0437  WBC 9.3 7.2  NEUTROABS 8.6*  --   HGB 11.6* 11.5*  HCT 36.1 37.0  MCV 97.3 98.4  PLT 235 218   Cardiac Enzymes: No results for input(s): CKTOTAL, CKMB, CKMBINDEX, TROPONINI in the last 168 hours.  BNP (last 3 results) Recent Labs    01/24/19 1206 11/26/19 2325 12/10/19 0953  BNP 1,025.0* 835.0* 171*    ProBNP (last 3 results) No results for input(s): PROBNP in the last 8760 hours.  CBG: No results for input(s): GLUCAP in the last 168 hours.  Radiological Exams on Admission: DG Chest Portable 1 View  Result Date: 01/13/2020 CLINICAL DATA:  Shortness of breath EXAM: PORTABLE CHEST 1 VIEW COMPARISON:  11/28/2019 FINDINGS: Cardiomegaly. Lungs are clear. No effusions. No acute bony abnormality. IMPRESSION: Cardiomegaly.  No acute cardiopulmonary disease. Electronically Signed   By: Rolm Baptise M.D.   On: 01/13/2020 22:23    EKG: I independently viewed the EKG done and my findings are as followed: Sinus rhythm at rate of 82bpm  Assessment/Plan Present on Admission: . Chest pain . Hypertension . CAD (coronary artery disease) . Hypothyroidism following radioiodine therapy . Obesity . Hyperlipidemia  Principal Problem:   Chest pain Active Problems:   Hypertension   CAD (coronary artery disease)   Hypothyroidism following radioiodine therapy   Obesity   Hyperlipidemia   Atypical chest pain Patient presents with reproducible midsternal sharp chest pain with radiation to the back. Cardiovascular risk factors include hypertension, CAD, hyperlipidemia, Obesity. Continue  telemetry High-sensitivity troponin I 4 > 9. Heart score = 5 Consider Cardiology consult to help decide if Stress test is needed in am Versus other diagnostic modalities.   Continue aspirin, nitroglycerin as needed and betablocker.   Essential hypertension Continue metoprolol per home regimen  CAD Continue aspirin, nitroglycerin, Ranexa  and atorvastatin per home regimen  Hypothyroidism Continue Synthroid per home regimen  Obesity Patient will follow up with PCP for weight loss control  Hyperlipidemia Continue statin   DVT prophylaxis: Lovenox  Code Status: Full code  Family Communication: None at bedside  Disposition Plan: Home once clinically improved  Consults called: None  Admission status: Observation    Bernadette Hoit MD [facility]  If 7PM-7AM, please contact night-coverage [email or web address]  01/14/2020, 5:36 AM

## 2020-01-14 NOTE — ED Provider Notes (Signed)
Patient presents to the emergency department for evaluation of chest pain.  She was seen by Dr. [name] and work-up initiated.  Patient was signed out to me with second troponin pending.  Second troponin has returned and is slightly higher.  Heart pathway cannot be completed because blood was drawn prior to 2 hours from the first 1.  An additional troponin was therefore ordered and it is also slightly elevated compared to the first and second.  Total delta is 5.  Based on heart pathway, patient recommended for admission.  I rechecked the patient at 1:30 AM and she is completely pain-free.     Orpah Greek, MD 01/14/20 [phone]

## 2020-01-15 DIAGNOSIS — R079 Chest pain, unspecified: Secondary | ICD-10-CM

## 2020-01-15 MED ORDER — ASPIRIN 81 MG PO CHEW: 81.0000 mg | CHEWABLE_TABLET | Freq: Every day | ORAL | 2 refills | Status: DC

## 2020-01-15 MED ORDER — ATORVASTATIN CALCIUM 80 MG PO TABS: 80.0000 mg | ORAL_TABLET | Freq: Every day | ORAL | 3 refills | Status: DC

## 2020-01-15 MED ORDER — RANEXA 1000 MG PO TB12: 1000.0000 mg | ORAL_TABLET | Freq: Two times a day (BID) | ORAL | 3 refills | Status: DC

## 2020-01-15 MED ORDER — FUROSEMIDE 40 MG PO TABS: ORAL_TABLET | ORAL | 6 refills | Status: DC

## 2020-01-15 MED ORDER — METOPROLOL TARTRATE 25 MG PO TABS: 25.0000 mg | ORAL_TABLET | Freq: Two times a day (BID) | ORAL | 3 refills | Status: DC

## 2020-01-15 NOTE — Discharge Summary (Signed)
Maria Fry, is a 69 y.o. female  DOB 1951/07/26  MRN [id number].  Admission date:  01/13/2020  Admitting Physician  Ankit Arsenio Loader, MD  Discharge Date:  01/15/2020   Primary MD  Alycia Rossetti, MD  Recommendations for primary care physician for things to follow:   1)Avoid ibuprofen/Advil/Aleve/Motrin/Goody Powders/Naproxen/BC powders/Meloxicam/Diclofenac/Indomethacin and other Nonsteroidal anti-inflammatory medications as these will make you more likely to bleed and can cause stomach ulcers, can also cause Kidney problems.   2) please continue aspirin with food, atorvastatin/Lipitor and metoprolol for your heart  3) please follow-up with your cardiologist Dr. Bronson Ing in 1 to 2 weeks for recheck and reevaluation  Admission Diagnosis  Chest pain [R07.9] Chest pain, unspecified type [R07.9] Stable angina ([place]) [I20.8]   Discharge Diagnosis  Chest pain [R07.9] Chest pain, unspecified type [R07.9] Stable angina ([place]) [I20.8]    Principal Problem:   Chest pain Active Problems:   Hypertension   CAD (coronary artery disease)   Hypothyroidism following radioiodine therapy   Obesity   Hyperlipidemia   Stable angina [facility])      Past Medical History:  Diagnosis Date  . CAD (coronary artery disease)    a. 06/07/15 NSTEMI/Cath: Dist LAD 100%, mid LAD 10%. Otw nl cors. Nl EF w/ inferoapical AK.  Marland Kitchen Chronic combined systolic (congestive) and diastolic (congestive) heart failure ([place]) 04/23/2016   a. 04/2016 Echo: EF 45%, Gr2 DD; b. 11/2019 Echo: EF 45-50%, mod LVH. Gr2 DD. Nl RV fxn. Sev dil LA.   . Essential hypertension   . Hip pain   . Hyperlipidemia   . Hypothyroidism   . MI (myocardial infarction) ([place]) 06/05/2015  . Vitamin D deficiency     Past Surgical History:  Procedure Laterality Date  . ABDOMINAL HYSTERECTOMY    . CARDIAC CATHETERIZATION N/A 06/06/2015   Procedure: Left Heart Cath and  Coronary Angiography;  Surgeon: Peter M Martinique, MD;  Location: [facility];  Service: Cardiovascular;  Laterality: N/A;  . CHOLECYSTECTOMY    . COLONOSCOPY    . COLONOSCOPY N/A 08/13/2017   Procedure: COLONOSCOPY;  Surgeon: Daneil Dolin, MD;  [facility];  Service: Endoscopy;  Laterality: N/A;  8:30 AM  . ESOPHAGOGASTRODUODENOSCOPY N/A 09/11/2016   Procedure: ESOPHAGOGASTRODUODENOSCOPY (EGD);  Surgeon: Daneil Dolin, MD;  [facility];  Service: Endoscopy;  Laterality: N/A;  7:45 am - moved to 10/11 @ 10:30  . Heel spurs    . MALONEY DILATION N/A 09/11/2016   Procedure: Venia Minks DILATION;  Surgeon: Daneil Dolin, MD;  [facility];  Service: Endoscopy;  Laterality: N/A;  . POLYPECTOMY  08/13/2017   Procedure: POLYPECTOMY;  Surgeon: Daneil Dolin, MD;  [facility];  Service: Endoscopy;;  colon  . SHOULDER ARTHROSCOPY    . TUBAL LIGATION         HPI  from the history and physical done on the day of admission:     Chief Complaint:   HPI: Maria EMPEY is a 69  y.o. female with medical history significant for CAD, chronic combined systolic and diastolic heart failure ([id number]), essential hypertension, hyperlipidemia, hypothyroidism and vitamin D deficiency who presents to the emergency department due to sharp stabbing midsternal chest pain with radiation to the back which started around 7:30 PM yesterday while playing bingo, pain was of moderate intensity and patient returned home due to chest discomfort.  On arrival at home, patient states that she took a sublingual nitroglycerin and 1 dose of baby aspirin without much relief, EMS was activated, and 3 baby aspirin were provided in route to the hospital.  Patient still had chest pain on arrival at the ED.  She denies diaphoresis, nausea, vomiting or abdominal pain.  ED Course:  In the emergency department, BP was 168/87 and other vital signs were within normal range.  Work-up in the ED  showed normal CBC and BMP. SARS coronavirus 2, influenza A and B were negative.  High-sensitivity troponin I 4 > 9. chest x-ray showed cardiomegaly with no acute cardiopulmonary disease.  IV morphine and IV Zofran were given with improvement in chest pain.  Hospitalist was asked to admit patient for further evaluation and management.    Hospital Course:   1)Chest PAIN--history of CAD, admitted with atypical chest pains, physical exam with reproducible chest wall tenderness, troponins and EKG not consistent with ACS, -CTA chest without acute PE -Cardiology consult appreciated -Overall ruled out for ACS by cardiac enzymes and EKG and remains chest pain-free -Repeat echo on 01/14/2020 with EF up to 60 to 65% from 45-50 % in December 2020 -No regional wall motion normalities, -Okay to discharge home with outpatient follow-up with cardiologist Dr. Bronson Ing -Continue aspirin, Lipitor and metoprolol  2)HFpEF--clinically stable, euvolemic, echo with EF of 66 5% as noted above #1 -Outpatient follow-up with cardiology as advised  Discharge Condition: stable  Follow UP  Follow-up Information    [name], MD. Schedule an appointment as soon as possible for a visit in 1 week(s).   Specialty: Cardiology Contact information: [place] [address] [phone]            Consults obtained - cardiology  Diet and Activity recommendation:  As advised  Discharge Instructions    Discharge Instructions    Call MD for:  difficulty breathing, headache or visual disturbances   Complete by: As directed    Call MD for:  extreme fatigue   Complete by: As directed    Call MD for:  persistant dizziness or light-headedness   Complete by: As directed    Call MD for:  persistant nausea and vomiting   Complete by: As directed    Call MD for:  severe uncontrolled pain   Complete by: As directed    Call MD for:  temperature >100.4   Complete by: As directed    Diet - low  sodium heart healthy   Complete by: As directed    Discharge instructions   Complete by: As directed    1)Avoid ibuprofen/Advil/Aleve/Motrin/Goody Powders/Naproxen/BC powders/Meloxicam/Diclofenac/Indomethacin and other Nonsteroidal anti-inflammatory medications as these will make you more likely to bleed and can cause stomach ulcers, can also cause Kidney problems.   2) please continue aspirin with food, atorvastatin/Lipitor and metoprolol for your heart  3) please follow-up with your cardiologist Dr. Bronson Ing in 1 to 2 weeks for recheck and reevaluation   Increase activity slowly   Complete by: As directed         Discharge Medications     Allergies  as of 01/15/2020      Reactions   Vicodin [hydrocodone-acetaminophen] Other (See Comments)   Severe nausea and diarrhea    Amoxicillin    edema   Demerol [meperidine] Nausea And Vomiting   Lisinopril Other (See Comments)   Throat swollen   Lodine [etodolac] Rash      Medication List    TAKE these medications   acetaminophen 500 MG tablet Commonly known as: TYLENOL Take 500 mg by mouth every 6 (six) hours as needed for mild pain or moderate pain. Reported on 04/22/2016   aspirin 81 MG chewable tablet Chew 1 tablet (81 mg total) by mouth daily with breakfast. What changed: when to take this   atorvastatin 80 MG tablet Commonly known as: LIPITOR Take 1 tablet (80 mg total) by mouth at bedtime.   ferrous sulfate 325 (65 FE) MG EC tablet Take 325 mg by mouth daily.   furosemide 40 MG tablet Commonly known as: Lasix Take 40 mg daily 4 days/week (Tuesday, Thursday, Saturday, Sunday) and take 40 mg twice daily 3 days/week (Monday, Wednesday and Friday)   gabapentin 300 MG capsule Commonly known as: NEURONTIN Take 300 mg by mouth at bedtime.   levothyroxine 100 MCG tablet Commonly known as: SYNTHROID TAKE 1 TABLET BY MOUTH ONCE DAILY BEFORE BREAKFAST   loratadine 10 MG tablet Commonly known as: CLARITIN Take 10 mg by  mouth daily as needed for allergies.   metoprolol tartrate 25 MG tablet Commonly known as: LOPRESSOR Take 1 tablet (25 mg total) by mouth 2 (two) times daily.   nitroGLYCERIN 0.4 MG SL tablet Commonly known as: NITROSTAT DISSOLVE ONE TABLET UNDER THE TONGUE EVERY 5 MINUTES AS NEEDED FOR CHEST PAIN.  DO NOT EXCEED A TOTAL OF 3 DOSES IN 15 MINUTES   potassium chloride 10 MEQ tablet Commonly known as: KLOR-CON Take 1 tablet (10 meq) 4 days/week (Tuesday, Thursday, Saturday, Sunday) and take 2 tablets (20 meq) 3 days/week (Monday, Wednesday, Friday)   Ranexa 1000 MG SR tablet Generic drug: ranolazine Take 1 tablet (1,000 mg total) by mouth 2 (two) times daily.   Vitamin D (Cholecalciferol) 25 MCG (1000 UT) Caps Take 1 tablet by mouth daily.       Major procedures and Radiology Reports - PLEASE review detailed and final reports for all details, in brief -   CT ANGIO CHEST PE W OR WO CONTRAST  Result Date: 01/14/2020 CLINICAL DATA:  Shortness of breath, midsternal chest pain since yesterday, pain radiating to interscapular region EXAM: CT ANGIOGRAPHY CHEST WITH CONTRAST TECHNIQUE: Multidetector CT imaging of the chest was performed using the standard protocol during bolus administration of intravenous contrast. Multiplanar CT image reconstructions and MIPs were obtained to evaluate the vascular anatomy. CONTRAST:  1107mL OMNIPAQUE IOHEXOL 350 MG/ML SOLN COMPARISON:  2 levin 21 FINDINGS: Cardiovascular: This is a technically adequate evaluation of the pulmonary vasculature. No filling defects or pulmonary emboli. The heart is enlarged without pericardial effusion. Minimal atherosclerosis of the aortic arch. No evidence of thoracic aortic aneurysm or dissection. Mediastinum/Nodes: No enlarged mediastinal, hilar, or axillary lymph nodes. Thyroid gland, trachea, and esophagus demonstrate no significant findings. Lungs/Pleura: No airspace disease, effusion, or pneumothorax. Central airways are widely  patent. Upper Abdomen: No acute abnormality. Musculoskeletal: No acute or destructive bony lesions. Reconstructed images demonstrate no additional findings. Review of the MIP images confirms the above findings. IMPRESSION: 1. No evidence of pulmonary embolus. 2. Cardiomegaly. 3.  Aortic Atherosclerosis (ICD10-I70.0). Electronically Signed   By: Diana Eves.D.  On: 01/14/2020 16:00   DG Chest Portable 1 View  Result Date: 01/13/2020 CLINICAL DATA:  Shortness of breath EXAM: PORTABLE CHEST 1 VIEW COMPARISON:  11/28/2019 FINDINGS: Cardiomegaly. Lungs are clear. No effusions. No acute bony abnormality. IMPRESSION: Cardiomegaly.  No acute cardiopulmonary disease. Electronically Signed   By: Rolm Baptise M.D.   On: 01/13/2020 22:23   ECHOCARDIOGRAM COMPLETE  Result Date: 01/14/2020    ECHOCARDIOGRAM REPORT   Patient Name:   ELLER GOTTS Date of Exam: 01/14/2020 Medical Rec #:  [id number]    Height:       60.0 in Accession #:    [id number]   Weight:       214.5 lb Date of Birth:  30-Mar-1951     BSA:          1.92 m Patient Age:    26 years     BP:           128/67 mmHg Patient Gender: F            HR:           73 bpm. Exam Location:  [facility] Procedure: 2D Echo, Cardiac Doppler and Color Doppler Indications:    Chest Pain 786.50 / R07.9  History:        Patient has prior history of Echocardiogram examinations, most                 recent 11/27/2019. CAD and Previous Myocardial Infarction; Risk                 Factors:Hypertension and Dyslipidemia. Chronic combined systolic                 and diastolic heart failure.  Sonographer:    Alvino Chapel RCS Referring Phys: [id number] [place]  1. Left ventricular ejection fraction, by estimation, is 60 to 65%. The left ventricle has normal function. The left ventricle has no regional wall motion abnormalities. There is severely increased left ventricular hypertrophy. Left ventricular diastolic parameters are consistent with Grade I diastolic  dysfunction (impaired relaxation).  2. Right ventricular systolic function is normal. The right ventricular size is normal. There is mildly elevated pulmonary artery systolic pressure.  3. Left atrial size was severely dilated.  4. Right atrial size was severely dilated.  5. The mitral valve is normal in structure and function. Mild mitral valve regurgitation. No evidence of mitral stenosis.  6. The aortic valve is tricuspid. Aortic valve regurgitation is not visualized. No aortic stenosis is present.  7. The inferior vena cava is normal in size with <50% respiratory variability, suggesting right atrial pressure of 8 mmHg. FINDINGS  Left Ventricle: Left ventricular ejection fraction, by estimation, is 60 to 65%. The left ventricle has normal function. The left ventricle has no regional wall motion abnormalities. There is severely increased left ventricular hypertrophy. Left ventricular diastolic parameters are consistent with Grade I diastolic dysfunction (impaired relaxation). Normal left ventricular filling pressure. Right Ventricle: The right ventricular size is normal. No increase in right ventricular wall thickness. Right ventricular systolic function is normal. There is mildly elevated pulmonary artery systolic pressure. The tricuspid regurgitant velocity is 2.58  m/s, and with an assumed right atrial pressure of 8 mmHg, the estimated right ventricular systolic pressure is [id number] mmHg. Left Atrium: Left atrial size was severely dilated. Right Atrium: Right atrial size was severely dilated. Pericardium: There is no evidence of pericardial effusion. Mitral Valve: The mitral valve is normal in structure  and function. Mild mitral valve regurgitation. No evidence of mitral valve stenosis. Tricuspid Valve: The tricuspid valve is normal in structure. Tricuspid valve regurgitation is mild . No evidence of tricuspid stenosis. Aortic Valve: The aortic valve is tricuspid. Aortic valve regurgitation is not visualized. No  aortic stenosis is present. Aortic valve mean gradient measures 6.7 mmHg. Aortic valve peak gradient measures 13.3 mmHg. Aortic valve area, by VTI measures 2.24  cm. Pulmonic Valve: The pulmonic valve was not well visualized. Pulmonic valve regurgitation is not visualized. No evidence of pulmonic stenosis. Aorta: The aortic root is normal in size and structure. Pulmonary Artery: Indeterminant PASP, inadequate TR jet. Venous: The inferior vena cava is normal in size with less than 50% respiratory variability, suggesting right atrial pressure of 8 mmHg. IAS/Shunts: No atrial level shunt detected by color flow Doppler.  LEFT VENTRICLE PLAX 2D LVIDd:         5.03 cm     Diastology LVIDs:         3.52 cm     LV e' lateral:   7.72 cm/s LV PW:         1.55 cm     LV E/e' lateral: 7.2 LV IVS:        1.61 cm     LV e' medial:    5.22 cm/s LVOT diam:     2.30 cm     LV E/e' medial:  10.6 LV SV:         79.36 ml LV SV Index:   32.74 LVOT Area:     4.15 cm  LV Volumes (MOD) LV vol d, MOD A2C: 93.7 ml LV vol d, MOD A4C: 64.4 ml LV vol s, MOD A2C: 29.6 ml LV vol s, MOD A4C: 24.5 ml LV SV MOD A2C:     64.1 ml LV SV MOD A4C:     64.4 ml LV SV MOD BP:      49.7 ml RIGHT VENTRICLE RV S prime:     17.40 cm/s TAPSE (M-mode): 2.5 cm LEFT ATRIUM             Index       RIGHT ATRIUM           Index LA diam:        3.90 cm 2.03 cm/m  RA Area:     25.40 cm LA Vol (A2C):   91.0 ml 47.32 ml/m RA Volume:   88.00 ml  45.76 ml/m LA Vol (A4C):   87.5 ml 45.50 ml/m LA Biplane Vol: 93.6 ml 48.67 ml/m  AORTIC VALVE AV Area (Vmax):    2.14 cm AV Area (Vmean):   2.08 cm AV Area (VTI):     2.24 cm AV Vmax:           182.28 cm/s AV Vmean:          124.124 cm/s AV VTI:            0.354 m AV Peak Grad:      13.3 mmHg AV Mean Grad:      6.7 mmHg LVOT Vmax:         94.10 cm/s LVOT Vmean:        62.000 cm/s LVOT VTI:          0.191 m LVOT/AV VTI ratio: 0.54  AORTA Ao Root diam: 3.20 cm MITRAL VALVE               TRICUSPID VALVE MV Area (PHT): 2.03  cm  TR Peak grad:   26.6 mmHg MV Decel Time: 373 msec    TR Vmax:        258.00 cm/s MV E velocity: 55.20 cm/s MV A velocity: 74.00 cm/s  SHUNTS MV E/A ratio:  0.75        Systemic VTI:  0.19 m                            Systemic Diam: 2.30 cm Carlyle Dolly MD Electronically signed by Carlyle Dolly MD Signature Date/Time: 01/14/2020/3:01:24 PM    Final     Micro Results    Recent Results (from the past 240 hour(s))  Respiratory Panel by RT PCR (Flu A&B, Covid) - Nasopharyngeal Swab     Status: None   Collection Time: 01/13/20  9:47 PM   Specimen: Nasopharyngeal Swab  Result Value Ref Range Status   SARS Coronavirus 2 by RT PCR NEGATIVE NEGATIVE Final    Comment: (NOTE) SARS-CoV-2 target nucleic acids are NOT DETECTED. The SARS-CoV-2 RNA is generally detectable in upper respiratoy specimens during the acute phase of infection. The lowest concentration of SARS-CoV-2 viral copies this assay can detect is 131 copies/mL. A negative result does not preclude SARS-Cov-2 infection and should not be used as the sole basis for treatment or other patient management decisions. A negative result may occur with  improper specimen collection/handling, submission of specimen other than nasopharyngeal swab, presence of viral mutation(s) within the areas targeted by this assay, and inadequate number of viral copies (<131 copies/mL). A negative result must be combined with clinical observations, patient history, and epidemiological information. The expected result is Negative. Fact Sheet for Patients:  PinkCheek.be Fact Sheet for Healthcare Providers:  GravelBags.it This test is not yet ap proved or cleared by the [place] FDA and  has been authorized for detection and/or diagnosis of SARS-CoV-2 by FDA under an Emergency Use Authorization (EUA). This EUA will remain  in effect (meaning this test can be used) for the duration of  the COVID-19 declaration under Section 564(b)(1) of the Act, 21 U.S.C. section 360bbb-3(b)(1), unless the authorization is terminated or revoked sooner.    Influenza A by PCR NEGATIVE NEGATIVE Final   Influenza B by PCR NEGATIVE NEGATIVE Final    Comment: (NOTE) The Xpert Xpress SARS-CoV-2/FLU/RSV assay is intended as an aid in  the diagnosis of influenza from Nasopharyngeal swab specimens and  should not be used as a sole basis for treatment. Nasal washings and  aspirates are unacceptable for Xpert Xpress SARS-CoV-2/FLU/RSV  testing. Fact Sheet for Patients: PinkCheek.be Fact Sheet for Healthcare Providers: GravelBags.it This test is not yet approved or cleared by the [place] FDA and  has been authorized for detection and/or diagnosis of SARS-CoV-2 by  FDA under an Emergency Use Authorization (EUA). This EUA will remain  in effect (meaning this test can be used) for the duration of the  Covid-19 declaration under Section 564(b)(1) of the Act, 21  U.S.C. section 360bbb-3(b)(1), unless the authorization is  terminated or revoked. Performed at [facility], [address] [place], [place] [address]        Today   Subjective    Savina Barrios today has no new complaints -Ambulating without dyspnea on exertion dizziness shortness of breath or palpitations          Patient has been seen and examined prior to discharge   Objective   Blood pressure (!) 108/42, pulse 64, temperature 98.5  F (36.9 C), resp. rate 18, height 5' (1.524 m), weight 97.3 kg, SpO2 94 %.   Intake/Output Summary (Last 24 hours) at 01/15/2020 1003 Last data filed at 01/15/2020 0610 Gross per 24 hour  Intake --  Output 250 ml  Net -250 ml    Exam Gen:- Awake Alert, no acute distress  HEENT:- [place].AT, No sclera icterus Neck-Supple Neck,No JVD,.  Lungs-  CTAB , good air movement bilaterally  CV- S1, S2 normal, regular Abd-  +ve B.Sounds,  Abd Soft, No tenderness,    Extremity/Skin:- No  edema,   good pulses Psych-affect is appropriate, oriented x3 Neuro-no new focal deficits, no tremors    Data Review   CBC w Diff:  Lab Results  Component Value Date   WBC 7.2 01/14/2020   HGB 11.5 (L) 01/14/2020   HCT 37.0 01/14/2020   PLT 218 01/14/2020   LYMPHOPCT 3 01/13/2020   MONOPCT 4 01/13/2020   EOSPCT 1 01/13/2020   BASOPCT 0 01/13/2020    CMP:  Lab Results  Component Value Date   NA 139 01/14/2020   K 3.9 01/14/2020   CL 103 01/14/2020   CO2 27 01/14/2020   BUN 22 01/14/2020   CREATININE 1.13 (H) 01/14/2020   CREATININE 1.03 (H) 01/03/2020   PROT 7.9 01/14/2020   ALBUMIN 3.5 01/14/2020   BILITOT 0.3 01/14/2020   ALKPHOS 75 01/14/2020   AST 33 01/14/2020   ALT 27 01/14/2020  .   Total Discharge time is about 33 minutes  Roxan Hockey M.D on 01/15/2020 at 10:03 AM  Go to [email or web address] -  for contact info  [facility] - Office  [phone]

## 2020-01-20 ENCOUNTER — Ambulatory Visit (INDEPENDENT_AMBULATORY_CARE_PROVIDER_SITE_OTHER): Payer: PPO | Admitting: "Endocrinology

## 2020-01-20 ENCOUNTER — Other Ambulatory Visit: Payer: Self-pay

## 2020-01-20 ENCOUNTER — Encounter: Payer: Self-pay | Admitting: "Endocrinology

## 2020-01-20 DIAGNOSIS — E890 Postprocedural hypothyroidism: Secondary | ICD-10-CM

## 2020-01-20 NOTE — Progress Notes (Signed)
01/20/2020                                 Endocrinology Telehealth Visit Follow up Note -During COVID -19 Pandemic  I connected with Maria Fry on 01/20/2020   by telephone and verified that I am speaking with the correct person using two identifiers. Maria Fry, 08/27/1951. she has verbally consented to this visit. All issues noted in this document were discussed and addressed. The format was not optimal for physical exam.   Subjective:    Patient ID: Maria Fry, female    DOB: 01/11/1951,    Past Medical History:  Diagnosis Date  . CAD (coronary artery disease)    a. 06/07/15 NSTEMI/Cath: Dist LAD 100%, mid LAD 10%. Otw nl cors. Nl EF w/ inferoapical AK.  Marland Kitchen Chronic combined systolic (congestive) and diastolic (congestive) heart failure ([place]) 04/23/2016   a. 04/2016 Echo: EF 45%, Gr2 DD; b. 11/2019 Echo: EF 45-50%, mod LVH. Gr2 DD. Nl RV fxn. Sev dil LA.   . Essential hypertension   . Hip pain   . Hyperlipidemia   . Hypothyroidism   . MI (myocardial infarction) ([place]) 06/05/2015  . Vitamin D deficiency    Past Surgical History:  Procedure Laterality Date  . ABDOMINAL HYSTERECTOMY    . CARDIAC CATHETERIZATION N/A 06/06/2015   Procedure: Left Heart Cath and Coronary Angiography;  Surgeon: Peter M Martinique, MD;  Location: [facility];  Service: Cardiovascular;  Laterality: N/A;  . CHOLECYSTECTOMY    . COLONOSCOPY    . COLONOSCOPY N/A 08/13/2017   Procedure: COLONOSCOPY;  Surgeon: Daneil Dolin, MD;  Location: [facility];  Service: Endoscopy;  Laterality: N/A;  8:30 AM  . ESOPHAGOGASTRODUODENOSCOPY N/A 09/11/2016   Procedure: ESOPHAGOGASTRODUODENOSCOPY (EGD);  Surgeon: Daneil Dolin, MD;  [facility]: [facility];  Service: Endoscopy;  Laterality: N/A;  7:45 am - moved to 10/11 @ 10:30  . Heel spurs    . MALONEY DILATION N/A 09/11/2016   Procedure: Venia Minks DILATION;  Surgeon: Daneil Dolin, MD;  [facility]: [facility];  Service: Endoscopy;  Laterality: N/A;  . POLYPECTOMY   08/13/2017   Procedure: POLYPECTOMY;  Surgeon: Daneil Dolin, MD;  Location: [facility];  Service: Endoscopy;;  colon  . SHOULDER ARTHROSCOPY    . TUBAL LIGATION     Social History   Socioeconomic History  . Marital status: Single    Spouse name: Not on file  . Number of children: Not on file  . Years of education: Not on file  . Highest education level: Not on file  Occupational History  . Not on file  Tobacco Use  . Smoking status: Former Smoker    Types: Cigarettes    Start date: 12/02/1977    Quit date: 12/02/2002    Years since quitting: 17.1  . Smokeless tobacco: Never Used  Substance and Sexual Activity  . Alcohol use: No    Alcohol/week: 0.0 standard drinks  . Drug use: No  . Sexual activity: Yes  Other Topics Concern  . Not on file  Social History Narrative  . Not on file   Social Determinants of Health   Financial Resource Strain:   . Difficulty of Paying Living Expenses: Not on file  Food Insecurity:   . Worried About Charity fundraiser in the Last Year: Not on file  . Ran Out of Food in the  Last Year: Not on file  Transportation Needs:   . Lack of Transportation (Medical): Not on file  . Lack of Transportation (Non-Medical): Not on file  Physical Activity:   . Days of Exercise per Week: Not on file  . Minutes of Exercise per Session: Not on file  Stress:   . Feeling of Stress : Not on file  Social Connections:   . Frequency of Communication with Friends and Family: Not on file  . Frequency of Social Gatherings with Friends and Family: Not on file  . Attends Religious Services: Not on file  . Active Member of Clubs or Organizations: Not on file  . Attends Archivist Meetings: Not on file  . Marital Status: Not on file   Outpatient Encounter Medications as of 01/20/2020  Medication Sig  . acetaminophen (TYLENOL) 500 MG tablet Take 500 mg by mouth every 6 (six) hours as needed for mild pain or moderate pain. Reported on 04/22/2016  .  aspirin 81 MG chewable tablet Chew 1 tablet (81 mg total) by mouth daily with breakfast.  . atorvastatin (LIPITOR) 80 MG tablet Take 1 tablet (80 mg total) by mouth at bedtime.  . ferrous sulfate 325 (65 FE) MG EC tablet Take 325 mg by mouth daily.  . furosemide (LASIX) 40 MG tablet Take 40 mg daily 4 days/week (Tuesday, Thursday, Saturday, Sunday) and take 40 mg twice daily 3 days/week (Monday, Wednesday and Friday)  . gabapentin (NEURONTIN) 300 MG capsule Take 300 mg by mouth at bedtime.   Marland Kitchen levothyroxine (SYNTHROID) 100 MCG tablet TAKE 1 TABLET BY MOUTH ONCE DAILY BEFORE BREAKFAST  . loratadine (CLARITIN) 10 MG tablet Take 10 mg by mouth daily as needed for allergies.  . metoprolol tartrate (LOPRESSOR) 25 MG tablet Take 1 tablet (25 mg total) by mouth 2 (two) times daily.  . nitroGLYCERIN (NITROSTAT) 0.4 MG SL tablet DISSOLVE ONE TABLET UNDER THE TONGUE EVERY 5 MINUTES AS NEEDED FOR CHEST PAIN.  DO NOT EXCEED A TOTAL OF 3 DOSES IN 15 MINUTES  . potassium chloride (KLOR-CON) 10 MEQ tablet Take 1 tablet (10 meq) 4 days/week (Tuesday, Thursday, Saturday, Sunday) and take 2 tablets (20 meq) 3 days/week (Monday, Wednesday, Friday)  . RANEXA 1000 MG SR tablet Take 1 tablet (1,000 mg total) by mouth 2 (two) times daily.  . Vitamin D, Cholecalciferol, 25 MCG (1000 UT) CAPS Take 1 tablet by mouth daily.   No facility-administered encounter medications on file as of 01/20/2020.   ALLERGIES: Allergies  Allergen Reactions  . Vicodin [Hydrocodone-Acetaminophen] Other (See Comments)    Severe nausea and diarrhea   . Amoxicillin     edema  . Demerol [Meperidine] Nausea And Vomiting  . Lisinopril Other (See Comments)    Throat swollen  . Lodine [Etodolac] Rash   VACCINATION STATUS: Immunization History  Administered Date(s) Administered  . Fluad Quad(high Dose 65+) 10/21/2019  . Influenza, High Dose Seasonal PF 08/24/2018  . Influenza,inj,Quad PF,6+ Mos 10/02/2015, 08/19/2016, 08/25/2017,  09/02/2019  . Pneumococcal Conjugate-13 01/01/2019  . Pneumococcal Polysaccharide-23 01/03/2020    Thyroid Problem Presents for follow-up (She was given RAI therapy for Graves' disease in the middle of September 2016) visit. Patient reports no cold intolerance, diarrhea, fatigue, heat intolerance or palpitations. The symptoms have been improving (She denies palpitations, tremors, heat/cold intolerance.  She denies dysphagia or shortness of breath, nor voice change.). Past treatments include iodine 131. The treatment provided significant relief. Risk factors include prior iodine 131 therapy.   Review of  systems : Limited as above.  Objective:    There were no vitals taken for this visit.  Wt Readings from Last 3 Encounters:  01/14/20 214 lb 8.1 oz (97.3 kg)  01/03/20 216 lb (98 kg)  12/13/19 215 lb (97.5 kg)      Results for orders placed or performed during the hospital encounter of 01/13/20  Respiratory Panel by RT PCR (Flu A&B, Covid) - Nasopharyngeal Swab   Specimen: Nasopharyngeal Swab  Result Value Ref Range   SARS Coronavirus 2 by RT PCR NEGATIVE NEGATIVE   Influenza A by PCR NEGATIVE NEGATIVE   Influenza B by PCR NEGATIVE NEGATIVE  CBC with Differential/Platelet  Result Value Ref Range   WBC 9.3 4.0 - 10.5 K/uL   RBC 3.71 (L) 3.87 - 5.11 MIL/uL   Hemoglobin 11.6 (L) 12.0 - 15.0 g/dL   HCT 36.1 36.0 - 46.0 %   MCV 97.3 80.0 - 100.0 fL   MCH 31.3 26.0 - 34.0 pg   MCHC 32.1 30.0 - 36.0 g/dL   RDW 15.7 (H) 11.5 - 15.5 %   Platelets 235 150 - 400 K/uL   nRBC 0.0 0.0 - 0.2 %   Neutrophils Relative % 92 %   Neutro Abs 8.6 (H) 1.7 - 7.7 K/uL   Lymphocytes Relative 3 %   Lymphs Abs 0.3 (L) 0.7 - 4.0 K/uL   Monocytes Relative 4 %   Monocytes Absolute 0.3 0.1 - 1.0 K/uL   Eosinophils Relative 1 %   Eosinophils Absolute 0.1 0.0 - 0.5 K/uL   Basophils Relative 0 %   Basophils Absolute 0.0 0.0 - 0.1 K/uL   Immature Granulocytes 0 %   Abs Immature Granulocytes 0.04 0.00 -  0.07 K/uL  Comprehensive metabolic panel  Result Value Ref Range   Sodium 138 135 - 145 mmol/L   Potassium 3.7 3.5 - 5.1 mmol/L   Chloride 102 98 - 111 mmol/L   CO2 26 22 - 32 mmol/L   Glucose, Bld 94 70 - 99 mg/dL   BUN 21 8 - 23 mg/dL   Creatinine, Ser 1.19 (H) 0.44 - 1.00 mg/dL   Calcium 8.8 (L) 8.9 - 10.3 mg/dL   Total Protein 8.1 6.5 - 8.1 g/dL   Albumin 3.7 3.5 - 5.0 g/dL   AST 24 15 - 41 U/L   ALT 22 0 - 44 U/L   Alkaline Phosphatase 85 38 - 126 U/L   Total Bilirubin 0.6 0.3 - 1.2 mg/dL   GFR calc non Af Amer 48 (L) >60 mL/min   GFR calc Af Amer 55 (L) >60 mL/min   Anion gap 10 5 - 15  D-dimer, quantitative (not at [facility])  Result Value Ref Range   D-Dimer, Quant 0.59 (H) 0.00 - 0.50 ug/mL-FEU  CBC  Result Value Ref Range   WBC 7.2 4.0 - 10.5 K/uL   RBC 3.76 (L) 3.87 - 5.11 MIL/uL   Hemoglobin 11.5 (L) 12.0 - 15.0 g/dL   HCT 37.0 36.0 - 46.0 %   MCV 98.4 80.0 - 100.0 fL   MCH 30.6 26.0 - 34.0 pg   MCHC 31.1 30.0 - 36.0 g/dL   RDW 16.0 (H) 11.5 - 15.5 %   Platelets 218 150 - 400 K/uL   nRBC 0.0 0.0 - 0.2 %  Comprehensive metabolic panel  Result Value Ref Range   Sodium 139 135 - 145 mmol/L   Potassium 3.9 3.5 - 5.1 mmol/L   Chloride 103 98 - 111 mmol/L  CO2 27 22 - 32 mmol/L   Glucose, Bld 100 (H) 70 - 99 mg/dL   BUN 22 8 - 23 mg/dL   Creatinine, Ser 1.13 (H) 0.44 - 1.00 mg/dL   Calcium 8.9 8.9 - 10.3 mg/dL   Total Protein 7.9 6.5 - 8.1 g/dL   Albumin 3.5 3.5 - 5.0 g/dL   AST 33 15 - 41 U/L   ALT 27 0 - 44 U/L   Alkaline Phosphatase 75 38 - 126 U/L   Total Bilirubin 0.3 0.3 - 1.2 mg/dL   GFR calc non Af Amer 51 (L) >60 mL/min   GFR calc Af Amer 59 (L) >60 mL/min   Anion gap 9 5 - 15  Magnesium  Result Value Ref Range   Magnesium 1.9 1.7 - 2.4 mg/dL  Phosphorus  Result Value Ref Range   Phosphorus 3.6 2.5 - 4.6 mg/dL  Brain natriuretic peptide  Result Value Ref Range   B Natriuretic Peptide 556.0 (H) 0.0 - 100.0 pg/mL  ECHOCARDIOGRAM COMPLETE   Result Value Ref Range   Weight 3,432.12 oz   Height 60 in   BP 128/67 mmHg  Troponin I (High Sensitivity)  Result Value Ref Range   Troponin I (High Sensitivity) 4 <18 ng/L  Troponin I (High Sensitivity)  Result Value Ref Range   Troponin I (High Sensitivity) 6 <18 ng/L  Troponin I (High Sensitivity)  Result Value Ref Range   Troponin I (High Sensitivity) 9 <18 ng/L  Troponin I (High Sensitivity)  Result Value Ref Range   Troponin I (High Sensitivity) 13 <18 ng/L     Assessment & Plan:   1.  RAI induced Hypothyroidism:   She is status post RAI therapy in September 2016 For hyperthyroidism. -Her previsit thyroid function tests are consistent with appropriate replacement.  She is advised to continue levothyroxine 100 mcg p.o. daily before breakfast.    - We discussed about the correct intake of her thyroid hormone, on empty stomach at fasting, with water, separated by at least 30 minutes from breakfast and other medications,  and separated by more than 4 hours from calcium, iron, multivitamins, acid reflux medications (PPIs). -Patient is made aware of the fact that thyroid hormone replacement is needed for life, dose to be adjusted by periodic monitoring of thyroid function tests.  I advised patient to maintain close follow up with her PCP for primary care needs.     - Time spent on this patient care encounter:  25 minutes of which 50% was spent in  counseling and the rest reviewing  her current and  previous labs / studies and medications  doses and developing a plan for long term care. Maria Fry  participated in the discussions, expressed understanding, and voiced agreement with the above plans.  All questions were answered to her satisfaction. she is encouraged to contact clinic should she have any questions or concerns prior to her return visit.  Follow up plan: Return in about 1 year (around 01/19/2021) for Follow up with Pre-visit Labs.  Glade Lloyd, MD Phone:  [phone]  Fax: [phone]  -  This note was partially dictated with voice recognition software. Similar sounding words can be transcribed inadequately or may not  be corrected upon review.  01/20/2020, 11:34 AM

## 2020-01-24 ENCOUNTER — Ambulatory Visit: Payer: PPO | Admitting: "Endocrinology

## 2020-02-01 DIAGNOSIS — M79609 Pain in unspecified limb: Secondary | ICD-10-CM | POA: Diagnosis not present

## 2020-02-01 DIAGNOSIS — R42 Dizziness and giddiness: Secondary | ICD-10-CM | POA: Diagnosis not present

## 2020-02-01 DIAGNOSIS — G603 Idiopathic progressive neuropathy: Secondary | ICD-10-CM | POA: Diagnosis not present

## 2020-02-01 DIAGNOSIS — R569 Unspecified convulsions: Secondary | ICD-10-CM | POA: Diagnosis not present

## 2020-02-01 DIAGNOSIS — R2689 Other abnormalities of gait and mobility: Secondary | ICD-10-CM | POA: Diagnosis not present

## 2020-02-10 ENCOUNTER — Telehealth: Payer: Self-pay

## 2020-02-10 ENCOUNTER — Ambulatory Visit: Payer: PPO | Admitting: Cardiovascular Disease

## 2020-02-10 ENCOUNTER — Encounter: Payer: Self-pay | Admitting: Cardiovascular Disease

## 2020-02-10 VITALS — BP 140/84 | HR 72 | Temp 98.7°F | Ht 60.0 in | Wt 215.0 lb

## 2020-02-10 DIAGNOSIS — I5032 Chronic diastolic (congestive) heart failure: Secondary | ICD-10-CM | POA: Diagnosis not present

## 2020-02-10 DIAGNOSIS — R600 Localized edema: Secondary | ICD-10-CM | POA: Diagnosis not present

## 2020-02-10 DIAGNOSIS — I10 Essential (primary) hypertension: Secondary | ICD-10-CM

## 2020-02-10 DIAGNOSIS — I25118 Atherosclerotic heart disease of native coronary artery with other forms of angina pectoris: Secondary | ICD-10-CM | POA: Diagnosis not present

## 2020-02-10 DIAGNOSIS — E785 Hyperlipidemia, unspecified: Secondary | ICD-10-CM

## 2020-02-10 NOTE — Telephone Encounter (Signed)
Virtual Visit Pre-Appointment Phone Call  "(Name), I am calling you today to discuss your upcoming appointment. We are currently trying to limit exposure to the virus that causes COVID-19 by seeing patients at home rather than in the office."  1. "What is the BEST phone number to call the day of the visit?" - include this in appointment notes  2. "Do you have or have access to (through a family member/friend) a smartphone with video capability that we can use for your visit?" a. If yes - list this number in appt notes as "cell" (if different from BEST phone #) and list the appointment type as a VIDEO visit in appointment notes b. If no - list the appointment type as a PHONE visit in appointment notes  Confirm consent - "In the setting of the current Covid19 crisis, you are scheduled for a (phone or video) visit with your provider on (date) at (time).  Just as we do with many in-office visits, in order for you to participate in this visit, we must obtain consent.  If you'd like, I can send this to your mychart (if signed up) or email for you to review.  Otherwise, I can obtain your verbal consent now.  All virtual visits are billed to your insurance company just like a normal visit would be.  By agreeing to a virtual visit, we'd like you to understand that the technology does not allow for your provider to perform an examination, and thus may limit your provider's ability to fully assess your condition. If your provider identifies any concerns that need to be evaluated in person, we will make arrangements to do so.  Finally, though the technology is pretty good, we cannot assure that it will always work on either your or our end, and in the setting of a video visit, we may have to convert it to a phone-only visit.  In either situation, we cannot ensure that we have a secure connection.  Are you willing to proceed?" STAFF: Did the patient verbally acknowledge consent to telehealth visit? Document  YES/NO here:   3. Advise patient to be prepared - "Two hours prior to your appointment, go ahead and check your blood pressure, pulse, oxygen saturation, and your weight (if you have the equipment to check those) and write them all down. When your visit starts, your provider will ask you for this information. If you have an Apple Watch or Kardia device, please plan to have heart rate information ready on the day of your appointment. Please have a pen and paper handy nearby the day of the visit as well."  4. Give patient instructions for MyChart download to smartphone OR [email or web address] as below if video visit (depending on what platform provider is using)  5. Inform patient they will receive a phone call 15 minutes prior to their appointment time (may be from unknown caller ID) so they should be prepared to answer    TELEPHONE CALL NOTE  Maria Fry has been deemed a candidate for a follow-up tele-health visit to limit community exposure during the Covid-19 pandemic. I spoke with the patient via phone to ensure availability of phone/video source, confirm preferred email & phone number, and discuss instructions and expectations.  I reminded Maria Fry to be prepared with any vital sign and/or heart rhythm information that could potentially be obtained via home monitoring, at the time of her visit. I reminded Maria Fry to expect a phone call prior to her  visit.  Dorothey Baseman 02/10/2020 11:20 AM   INSTRUCTIONS FOR DOWNLOADING THE MYCHART APP TO SMARTPHONE  - The patient must first make sure to have activated MyChart and know their login information - If Apple, go to [employer] and type in MyChart in the search bar and download the app. If Android, ask patient to go to [employer] and type in [place] in the search bar and download the app. The app is free but as with any other app downloads, their phone may require them to verify saved payment information or Apple/Android password.   - The patient will need to then log into the app with their MyChart username and password, and select [facility] as their healthcare provider to link the account. When it is time for your visit, go to the MyChart app, find appointments, and click Begin Video Visit. Be sure to Select Allow for your device to access the Microphone and Camera for your visit. You will then be connected, and your provider will be with you shortly.  **If they have any issues connecting, or need assistance please contact MyChart service desk [phone])**  **If using a computer, in order to ensure the best quality for their visit they will need to use either of the following Internet Browsers: [employer], or Google Chrome**  IF USING DOXIMITY or DOXY.ME - The patient will receive a link just prior to their visit by text.     FULL LENGTH CONSENT FOR TELE-HEALTH VISIT   I hereby voluntarily request, consent and authorize [facility] and its employed or contracted physicians, physician assistants, nurse practitioners or other licensed health care professionals (the Practitioner), to provide me with telemedicine health care services (the "Services") as deemed necessary by the treating Practitioner. I acknowledge and consent to receive the Services by the Practitioner via telemedicine. I understand that the telemedicine visit will involve communicating with the Practitioner through live audiovisual communication technology and the disclosure of certain medical information by electronic transmission. I acknowledge that I have been given the opportunity to request an in-person assessment or other available alternative prior to the telemedicine visit and am voluntarily participating in the telemedicine visit.  I understand that I have the right to withhold or withdraw my consent to the use of telemedicine in the course of my care at any time, without affecting my right to future care or treatment, and that  the Practitioner or I may terminate the telemedicine visit at any time. I understand that I have the right to inspect all information obtained and/or recorded in the course of the telemedicine visit and may receive copies of available information for a reasonable fee.  I understand that some of the potential risks of receiving the Services via telemedicine include:  Marland Kitchen Delay or interruption in medical evaluation due to technological equipment failure or disruption; . Information transmitted may not be sufficient (e.g. poor resolution of images) to allow for appropriate medical decision making by the Practitioner; and/or  . In rare instances, security protocols could fail, causing a breach of personal health information.  Furthermore, I acknowledge that it is my responsibility to provide information about my medical history, conditions and care that is complete and accurate to the best of my ability. I acknowledge that Practitioner's advice, recommendations, and/or decision may be based on factors not within their control, such as incomplete or inaccurate data provided by me or distortions of diagnostic images or specimens that may result from electronic transmissions. I understand that  the practice of medicine is not an exact science and that Practitioner makes no warranties or guarantees regarding treatment outcomes. I acknowledge that I will receive a copy of this consent concurrently upon execution via email to the email address I last provided but may also request a printed copy by calling the office of [facility].    I understand that my insurance will be billed for this visit.   I have read or had this consent read to me. . I understand the contents of this consent, which adequately explains the benefits and risks of the Services being provided via telemedicine.  . I have been provided ample opportunity to ask questions regarding this consent and the Services and have had my questions answered to  my satisfaction. . I give my informed consent for the services to be provided through the use of telemedicine in my medical care  By participating in this telemedicine visit I agree to the above.

## 2020-02-10 NOTE — Progress Notes (Signed)
SUBJECTIVE: The patient presents for posthospitalization follow-up.  She was hospitalized for chest pain in February.  It was deemed atypical in etiology.  Echocardiogram on 01/14/2020 demonstrated normal LV systolic function and regional wall motion, LVEF 60 to 65%, with grade 1 diastolic dysfunction.  There is mild elevation of pulmonary artery systolic pressures.  There was severe biatrial enlargement.  There was mild mitral regurgitation.  She underwent coronary angiography on 06/06/15 which demonstrated 100% distal LAD stenosis but otherwise had essentially normal coronary arteries.  She told me "I went on a fried chicken frenzy.  I also ate fried pork chops ".  She had been playing bingo when symptoms occurred.  She has been playing bingo for over 40 years.  Today she is doing well and denies chest pain, palpitations, shortness of breath.  She has chronic bilateral leg edema alleviated with Lasix.  She told me her 2 daughters check on her frequently to make sure she does not eating fried foods.    Review of Systems: As per "subjective", otherwise negative.  Allergies  Allergen Reactions  . Vicodin [Hydrocodone-Acetaminophen] Other (See Comments)    Severe nausea and diarrhea   . Amoxicillin     edema  . Demerol [Meperidine] Nausea And Vomiting  . Lisinopril Other (See Comments)    Throat swollen  . Lodine [Etodolac] Rash    Current Outpatient Medications  Medication Sig Dispense Refill  . acetaminophen (TYLENOL) 500 MG tablet Take 500 mg by mouth every 6 (six) hours as needed for mild pain or moderate pain. Reported on 04/22/2016    . aspirin 81 MG chewable tablet Chew 1 tablet (81 mg total) by mouth daily with breakfast. 30 tablet 2  . atorvastatin (LIPITOR) 80 MG tablet Take 1 tablet (80 mg total) by mouth at bedtime. 90 tablet 3  . ferrous sulfate 325 (65 FE) MG EC tablet Take 325 mg by mouth daily.    . furosemide (LASIX) 40 MG tablet Take 40 mg daily 4 days/week  (Tuesday, Thursday, Saturday, Sunday) and take 40 mg twice daily 3 days/week (Monday, Wednesday and Friday) 120 tablet 6  . gabapentin (NEURONTIN) 300 MG capsule Take 300 mg by mouth 2 (two) times daily.     Marland Kitchen levothyroxine (SYNTHROID) 100 MCG tablet TAKE 1 TABLET BY MOUTH ONCE DAILY BEFORE BREAKFAST 90 tablet 0  . loratadine (CLARITIN) 10 MG tablet Take 10 mg by mouth daily as needed for allergies.    . metoprolol tartrate (LOPRESSOR) 25 MG tablet Take 1 tablet (25 mg total) by mouth 2 (two) times daily. 180 tablet 3  . nitroGLYCERIN (NITROSTAT) 0.4 MG SL tablet DISSOLVE ONE TABLET UNDER THE TONGUE EVERY 5 MINUTES AS NEEDED FOR CHEST PAIN.  DO NOT EXCEED A TOTAL OF 3 DOSES IN 15 MINUTES 25 tablet 3  . potassium chloride (KLOR-CON) 10 MEQ tablet Take 1 tablet (10 meq) 4 days/week (Tuesday, Thursday, Saturday, Sunday) and take 2 tablets (20 meq) 3 days/week (Monday, Wednesday, Friday) 40 tablet 0  . RANEXA 1000 MG SR tablet Take 1 tablet (1,000 mg total) by mouth 2 (two) times daily. 180 tablet 3  . Vitamin D, Cholecalciferol, 25 MCG (1000 UT) CAPS Take 1 tablet by mouth daily.     No current facility-administered medications for this visit.    Past Medical History:  Diagnosis Date  . CAD (coronary artery disease)    a. 06/07/15 NSTEMI/Cath: Dist LAD 100%, mid LAD 10%. Otw nl cors. Nl EF w/ inferoapical  AK.  . Chronic combined systolic (congestive) and diastolic (congestive) heart failure ([place]) 04/23/2016   a. 04/2016 Echo: EF 45%, Gr2 DD; b. 11/2019 Echo: EF 45-50%, mod LVH. Gr2 DD. Nl RV fxn. Sev dil LA.   . Essential hypertension   . Hip pain   . Hyperlipidemia   . Hypothyroidism   . MI (myocardial infarction) ([place]) 06/05/2015  . Vitamin D deficiency     Past Surgical History:  Procedure Laterality Date  . ABDOMINAL HYSTERECTOMY    . CARDIAC CATHETERIZATION N/A 06/06/2015   Procedure: Left Heart Cath and Coronary Angiography;  Surgeon: Peter M Martinique, MD;  Location: [place] [facility];   Service: Cardiovascular;  Laterality: N/A;  . CHOLECYSTECTOMY    . COLONOSCOPY    . COLONOSCOPY N/A 08/13/2017   Procedure: COLONOSCOPY;  Surgeon: Daneil Dolin, MD;  [facility];  Service: Endoscopy;  Laterality: N/A;  8:30 AM  . ESOPHAGOGASTRODUODENOSCOPY N/A 09/11/2016   Procedure: ESOPHAGOGASTRODUODENOSCOPY (EGD);  Surgeon: Daneil Dolin, MD;  [facility];  Service: Endoscopy;  Laterality: N/A;  7:45 am - moved to 10/11 @ 10:30  . Heel spurs    . MALONEY DILATION N/A 09/11/2016   Procedure: Venia Minks DILATION;  Surgeon: Daneil Dolin, MD;  [facility];  Service: Endoscopy;  Laterality: N/A;  . POLYPECTOMY  08/13/2017   Procedure: POLYPECTOMY;  Surgeon: Daneil Dolin, MD;  [facility];  Service: Endoscopy;;  colon  . SHOULDER ARTHROSCOPY    . TUBAL LIGATION      Social History   Socioeconomic History  . Marital status: Single    Spouse name: Not on file  . Number of children: Not on file  . Years of education: Not on file  . Highest education level: Not on file  Occupational History  . Not on file  Tobacco Use  . Smoking status: Former Smoker    Types: Cigarettes    Start date: 12/02/1977    Quit date: 12/02/2002    Years since quitting: 17.2  . Smokeless tobacco: Never Used  Substance and Sexual Activity  . Alcohol use: No    Alcohol/week: 0.0 standard drinks  . Drug use: No  . Sexual activity: Yes  Other Topics Concern  . Not on file  Social History Narrative  . Not on file   Social Determinants of Health   Financial Resource Strain:   . Difficulty of Paying Living Expenses:   Food Insecurity:   . Worried About Charity fundraiser in the Last Year:   . Arboriculturist in the Last Year:   Transportation Needs:   . Film/video editor (Medical):   Marland Kitchen Lack of Transportation (Non-Medical):   Physical Activity:   . Days of Exercise per Week:   . Minutes of Exercise per Session:   Stress:   . Feeling of Stress :    Social Connections:   . Frequency of Communication with Friends and Family:   . Frequency of Social Gatherings with Friends and Family:   . Attends Religious Services:   . Active Member of Clubs or Organizations:   . Attends Archivist Meetings:   Marland Kitchen Marital Status:   Intimate Partner Violence:   . Fear of Current or Ex-Partner:   . Emotionally Abused:   Marland Kitchen Physically Abused:   . Sexually Abused:     Barbarann Ehlers, RN was present throughout the entirety of the encounter.  Vitals:  02/10/20 1059  BP: 140/84  Pulse: 72  Temp: 98.7 F (37.1 C)  SpO2: 98%  Weight: 215 lb (97.5 kg)  Height: 5' (1.524 m)    Wt Readings from Last 3 Encounters:  02/10/20 215 lb (97.5 kg)  01/14/20 214 lb 8.1 oz (97.3 kg)  01/03/20 216 lb (98 kg)     PHYSICAL EXAM General: NAD HEENT: Normal. Neck: No JVD, no thyromegaly. Lungs: Clear to auscultation bilaterally with normal respiratory effort. CV: Regular rate and rhythm, normal S1/S2, no S3/S4, no murmur.  Trace bilateral lower extremity edema.  No carotid bruit.   Abdomen: Soft, nontender, no distention.  Neurologic: Alert and oriented.  Psych: Normal affect. Skin: Normal. Musculoskeletal: No gross deformities.      Labs: Lab Results  Component Value Date/Time   K 3.9 01/14/2020 04:37 AM   BUN 22 01/14/2020 04:37 AM   CREATININE 1.13 (H) 01/14/2020 04:37 AM   CREATININE 1.03 (H) 01/03/2020 09:48 AM   ALT 27 01/14/2020 04:37 AM   TSH 3.58 01/11/2020 09:00 AM   HGB 11.5 (L) 01/14/2020 04:37 AM     Lipids: Lab Results  Component Value Date/Time   LDLCALC 63 01/03/2020 09:48 AM   CHOL 123 01/03/2020 09:48 AM   TRIG 68 01/03/2020 09:48 AM   HDL 46 (L) 01/03/2020 09:48 AM       Nuclear stress test 11/09/2019:  IMPRESSION: 1. Small scar in the apical segment of the inferior wall with peri-infarct ischemia.  2. Global hypokinesia and mild LEFT ventricular dilatation.  3. Left ventricular ejection fraction  41%  4. Non invasive risk stratification*: Intermediate   ASSESSMENT AND PLAN:  1.  Coronary artery disease: Symptomatically stable.  Continue aspirin, metoprolol, atorvastatin, and ranolazine.  2.  Hypertension: Blood pressure is borderline elevated.  No change to therapy.  3.  Hyperlipidemia: Lipids from 01/03/2020 reviewed above.  LDL at goal.  Continue atorvastatin.  4.  Chronic diastolic heart failure: LVEF 60 to 65% by echocardiogram in February 2021.  Continue present regimen of Lasix.   Disposition: Follow up 6 months virtual visit   Kate Sable, M.D., F.A.C.C.

## 2020-02-10 NOTE — Patient Instructions (Signed)
Medication Instructions:  Your physician recommends that you continue on your current medications as directed. Please refer to the Current Medication list given to you today.  *If you need a refill on your cardiac medications before your next appointment, please call your pharmacy*   Lab Work: None today  If you have labs (blood work) drawn today and your tests are completely normal, you will receive your results only by: . MyChart Message (if you have MyChart) OR . A paper copy in the mail If you have any lab test that is abnormal or we need to change your treatment, we will call you to review the results.   Testing/Procedures: None today    Follow-Up: At [facility], you and your health needs are our priority.  As part of our continuing mission to provide you with exceptional heart care, we have created designated Provider Care Teams.  These Care Teams include your primary Cardiologist (physician) and Advanced Practice Providers (APPs -  Physician Assistants and Nurse Practitioners) who all work together to provide you with the care you need, when you need it.  We recommend signing up for the patient portal called "MyChart".  Sign up information is provided on this After Visit Summary.  MyChart is used to connect with patients for Virtual Visits (Telemedicine).  Patients are able to view lab/test results, encounter notes, upcoming appointments, etc.  Non-urgent messages can be sent to your provider as well.   To learn more about what you can do with MyChart, go to [email or web address].    Your next appointment:   6 month(s)  The format for your next appointment:   Virtual Visit   Provider:   Suresh Koneswaran, MD   Other Instructions None      Thank you for choosing [facility] !         

## 2020-03-08 ENCOUNTER — Other Ambulatory Visit: Payer: Self-pay | Admitting: Cardiovascular Disease

## 2020-04-05 ENCOUNTER — Other Ambulatory Visit: Payer: Self-pay | Admitting: "Endocrinology

## 2020-04-08 ENCOUNTER — Emergency Department (HOSPITAL_COMMUNITY)
Admission: EM | Admit: 2020-04-08 | Discharge: 2020-04-08 | Disposition: A | Discharge status: Home / Self Care | Payer: PPO | Attending: Emergency Medicine | Admitting: Emergency Medicine

## 2020-04-08 ENCOUNTER — Emergency Department (HOSPITAL_COMMUNITY): Payer: PPO

## 2020-04-08 ENCOUNTER — Encounter (HOSPITAL_COMMUNITY): Payer: Self-pay | Admitting: Emergency Medicine

## 2020-04-08 ENCOUNTER — Other Ambulatory Visit: Payer: Self-pay

## 2020-04-08 DIAGNOSIS — Z79899 Other long term (current) drug therapy: Secondary | ICD-10-CM | POA: Insufficient documentation

## 2020-04-08 DIAGNOSIS — I5042 Chronic combined systolic (congestive) and diastolic (congestive) heart failure: Secondary | ICD-10-CM | POA: Insufficient documentation

## 2020-04-08 DIAGNOSIS — E039 Hypothyroidism, unspecified: Secondary | ICD-10-CM | POA: Diagnosis not present

## 2020-04-08 DIAGNOSIS — Z7982 Long term (current) use of aspirin: Secondary | ICD-10-CM | POA: Insufficient documentation

## 2020-04-08 DIAGNOSIS — I110 Hypertensive heart disease with heart failure: Secondary | ICD-10-CM | POA: Insufficient documentation

## 2020-04-08 DIAGNOSIS — I2510 Atherosclerotic heart disease of native coronary artery without angina pectoris: Secondary | ICD-10-CM | POA: Diagnosis not present

## 2020-04-08 DIAGNOSIS — R42 Dizziness and giddiness: Secondary | ICD-10-CM | POA: Insufficient documentation

## 2020-04-08 DIAGNOSIS — Z87891 Personal history of nicotine dependence: Secondary | ICD-10-CM | POA: Insufficient documentation

## 2020-04-08 DIAGNOSIS — R112 Nausea with vomiting, unspecified: Secondary | ICD-10-CM | POA: Diagnosis not present

## 2020-04-08 LAB — CBC WITH DIFFERENTIAL/PLATELET
Abs Immature Granulocytes: 0.02 10*3/uL (ref 0.00–0.07)
Basophils Absolute: 0 10*3/uL (ref 0.0–0.1)
Basophils Relative: 0 %
Eosinophils Absolute: 0.1 10*3/uL (ref 0.0–0.5)
Eosinophils Relative: 1 %
HCT: 34.6 % — ABNORMAL LOW (ref 36.0–46.0)
Hemoglobin: 11.3 g/dL — ABNORMAL LOW (ref 12.0–15.0)
Immature Granulocytes: 0 %
Lymphocytes Relative: 21 %
Lymphs Abs: 1.3 10*3/uL (ref 0.7–4.0)
MCH: 32 pg (ref 26.0–34.0)
MCHC: 32.7 g/dL (ref 30.0–36.0)
MCV: 98 fL (ref 80.0–100.0)
Monocytes Absolute: 0.6 10*3/uL (ref 0.1–1.0)
Monocytes Relative: 9 %
Neutro Abs: 4.5 10*3/uL (ref 1.7–7.7)
Neutrophils Relative %: 69 %
Platelets: 263 10*3/uL (ref 150–400)
RBC: 3.53 MIL/uL — ABNORMAL LOW (ref 3.87–5.11)
RDW: 14.7 % (ref 11.5–15.5)
WBC: 6.4 10*3/uL (ref 4.0–10.5)
nRBC: 0 % (ref 0.0–0.2)

## 2020-04-08 LAB — COMPREHENSIVE METABOLIC PANEL
ALT: 18 U/L (ref 0–44)
AST: 20 U/L (ref 15–41)
Albumin: 3.4 g/dL — ABNORMAL LOW (ref 3.5–5.0)
Alkaline Phosphatase: 86 U/L (ref 38–126)
Anion gap: 6 (ref 5–15)
BUN: 15 mg/dL (ref 8–23)
CO2: 28 mmol/L (ref 22–32)
Calcium: 8.6 mg/dL — ABNORMAL LOW (ref 8.9–10.3)
Chloride: 101 mmol/L (ref 98–111)
Creatinine, Ser: 0.79 mg/dL (ref 0.44–1.00)
GFR calc Af Amer: >60 mL/min (ref >60)
GFR calc non Af Amer: >60 mL/min (ref >60)
Glucose, Bld: 115 mg/dL — ABNORMAL HIGH (ref 70–99)
Potassium: 4.3 mmol/L (ref 3.5–5.1)
Sodium: 135 mmol/L (ref 135–145)
Total Bilirubin: 0.6 mg/dL (ref 0.3–1.2)
Total Protein: 7.6 g/dL (ref 6.5–8.1)

## 2020-04-08 LAB — TROPONIN I (HIGH SENSITIVITY)
Troponin I (High Sensitivity): 8 ng/L (ref <18)
Troponin I (High Sensitivity): 8 ng/L (ref <18)

## 2020-04-08 MED ORDER — SODIUM CHLORIDE 0.9 % IV BOLUS
1000.0000 mL | Freq: Once | INTRAVENOUS | Status: AC
  Administered 2020-04-08: 12:00:00 1000 mL via INTRAVENOUS

## 2020-04-08 MED ORDER — ONDANSETRON HCL 4 MG/2ML IJ SOLN
4.0000 mg | Freq: Once | INTRAMUSCULAR | Status: AC
  Administered 2020-04-08: 12:00:00 4 mg via INTRAVENOUS
  Filled 2020-04-08: qty 2

## 2020-04-08 MED ORDER — MECLIZINE HCL 25 MG PO TABS
25.0000 mg | ORAL_TABLET | Freq: Three times a day (TID) | ORAL | 0 refills | Status: DC | PRN
Start: 2020-04-08 — End: 2020-05-02

## 2020-04-08 MED ORDER — ONDANSETRON 4 MG PO TBDP: ORAL_TABLET | ORAL | 0 refills | Status: DC

## 2020-04-08 MED ORDER — MECLIZINE HCL 12.5 MG PO TABS
25.0000 mg | ORAL_TABLET | Freq: Once | ORAL | Status: AC
  Administered 2020-04-08: 12:00:00 25 mg via ORAL
  Filled 2020-04-08: qty 2

## 2020-04-08 NOTE — ED Triage Notes (Signed)
Patient c/o dizziness with nausea, vomiting and fatigue. Per patient woke Friday morning with dizziness. Per patient dizziness and nausea worse with movement. Patient states she took "motion sickness" approx 1 hour ago but vomited shortly after. Denies any headache, facial drooping, slurred speech, or weakness on a certain side.

## 2020-04-08 NOTE — Discharge Instructions (Addendum)
Drink plenty of fluids.  Follow-up with Dr. Benjamine Mola.  You can also see Dr. Hermenia Bers.  Rest for the next couple days

## 2020-04-08 NOTE — ED Provider Notes (Signed)
[facility] EMERGENCY DEPARTMENT Provider Note   CSN: [id number] Arrival date & time: 04/08/20  1041     History Chief Complaint  Patient presents with  . Dizziness    Maria Fry is a 69 y.o. female.  Patient complains of nausea and vertigo symptoms.  Patient has had problems with inner ear disease before  The history is provided by the patient. No language interpreter was used.  Dizziness Quality:  Head spinning Severity:  Mild Onset quality:  Sudden Timing:  Constant Progression:  Waxing and waning Chronicity:  Recurrent Context: bending over   Relieved by:  Nothing Worsened by:  Nothing Ineffective treatments:  None tried Associated symptoms: no chest pain, no diarrhea and no headaches        Past Medical History:  Diagnosis Date  . CAD (coronary artery disease)    a. 06/07/15 NSTEMI/Cath: Dist LAD 100%, mid LAD 10%. Otw nl cors. Nl EF w/ inferoapical AK.  Marland Kitchen Chronic combined systolic (congestive) and diastolic (congestive) heart failure ([place]) 04/23/2016   a. 04/2016 Echo: EF 45%, Gr2 DD; b. 11/2019 Echo: EF 45-50%, mod LVH. Gr2 DD. Nl RV fxn. Sev dil LA.   . Essential hypertension   . Hip pain   . Hyperlipidemia   . Hypothyroidism   . MI (myocardial infarction) ([place]) 06/05/2015  . Vitamin D deficiency     Patient Active Problem List   Diagnosis Date Noted  . Chest pain 01/14/2020  . Stable angina ([place]) 01/14/2020  . Abnormal nuclear stress test 12/08/2019  . Hypokalemia 11/28/2019  . Anemia 11/28/2019  . Hyperlipidemia 11/27/2019  . Peripheral edema 05/18/2019  . OA (osteoarthritis) of knee 05/18/2019  . DDD (degenerative disc disease), lumbar 05/18/2019  . Obesity 12/26/2017  . Seasonal allergies 08/19/2016  . Glucose intolerance (impaired glucose tolerance) 05/14/2016  . Chronic hip pain 05/14/2016  . Chronic combined systolic and diastolic heart failure ([place]) 05/14/2016  . Hypothyroidism following radioiodine therapy 11/30/2015  . CAD (coronary  artery disease)   . NSTEMI (non-ST elevated myocardial infarction) ([place])   . Hypertension 06/05/2015    Past Surgical History:  Procedure Laterality Date  . ABDOMINAL HYSTERECTOMY    . CARDIAC CATHETERIZATION N/A 06/06/2015   Procedure: Left Heart Cath and Coronary Angiography;  Surgeon: Peter M Martinique, MD;  Location: [place] [facility];  Service: Cardiovascular;  Laterality: N/A;  . CHOLECYSTECTOMY    . COLONOSCOPY    . COLONOSCOPY N/A 08/13/2017   Procedure: COLONOSCOPY;  Surgeon: Daneil Dolin, MD;  [facility];  Service: Endoscopy;  Laterality: N/A;  8:30 AM  . ESOPHAGOGASTRODUODENOSCOPY N/A 09/11/2016   Procedure: ESOPHAGOGASTRODUODENOSCOPY (EGD);  Surgeon: Daneil Dolin, MD;  [facility];  Service: Endoscopy;  Laterality: N/A;  7:45 am - moved to 10/11 @ 10:30  . Heel spurs    . MALONEY DILATION N/A 09/11/2016   Procedure: Venia Minks DILATION;  Surgeon: Daneil Dolin, MD;  [facility];  Service: Endoscopy;  Laterality: N/A;  . POLYPECTOMY  08/13/2017   Procedure: POLYPECTOMY;  Surgeon: Daneil Dolin, MD;  [facility];  Service: Endoscopy;;  colon  . SHOULDER ARTHROSCOPY    . TUBAL LIGATION       OB History    Gravida  3   Para  3   Term  3   Preterm      AB      Living  3     SAB  TAB      Ectopic      Multiple      Live Births              Family History  Problem Relation Age of Onset  . Heart failure Father        Deceased  . Heart attack Father        Deceased  . Stroke Sister        Deceased  . Heart failure Brother        Deceased  . Cancer Brother        Deceased  . Heart attack Brother        Deceased  . Colon cancer Neg Hx     Social History   Tobacco Use  . Smoking status: Former Smoker    Types: Cigarettes    Start date: 12/02/1977    Quit date: 12/02/2002    Years since quitting: 17.3  . Smokeless tobacco: Never Used  Substance Use Topics  . Alcohol use: No     Alcohol/week: 0.0 standard drinks  . Drug use: No    Home Medications Prior to Admission medications   Medication Sig Start Date End Date Taking? Authorizing Provider  acetaminophen (TYLENOL) 500 MG tablet Take 500 mg by mouth every 6 (six) hours as needed for mild pain or moderate pain. Reported on 04/22/2016   Yes [provider]  aspirin 81 MG chewable tablet Chew 1 tablet (81 mg total) by mouth daily with breakfast. 01/15/20  Yes Emokpae, Courage, MD  atorvastatin (LIPITOR) 80 MG tablet Take 1 tablet (80 mg total) by mouth at bedtime. 01/15/20  Yes Roxan Hockey, MD  EUTHYROX 100 MCG tablet TAKE 1 TABLET BY MOUTH ONCE DAILY BEFORE BREAKFAST 04/06/20  Yes Nida, Marella Chimes, MD  ferrous sulfate 325 (65 FE) MG EC tablet Take 325 mg by mouth daily.   Yes [provider]  furosemide (LASIX) 40 MG tablet Take 40 mg daily 4 days/week (Tuesday, Thursday, Saturday, Sunday) and take 40 mg twice daily 3 days/week (Monday, Wednesday and Friday) 01/15/20  Yes Emokpae, Courage, MD  gabapentin (NEURONTIN) 300 MG capsule Take 300 mg by mouth 2 (two) times daily.    Yes [provider]  loratadine (CLARITIN) 10 MG tablet Take 10 mg by mouth daily as needed for allergies.   Yes [provider]  metoprolol tartrate (LOPRESSOR) 25 MG tablet Take 1 tablet (25 mg total) by mouth 2 (two) times daily. 01/15/20  Yes Emokpae, Courage, MD  nitroGLYCERIN (NITROSTAT) 0.4 MG SL tablet DISSOLVE ONE TABLET UNDER THE TONGUE EVERY 5 MINUTES AS NEEDED FOR CHEST PAIN.  DO NOT EXCEED A TOTAL OF 3 DOSES IN 15 MINUTES 12/15/18  Yes [name], MD  potassium chloride (KLOR-CON) 10 MEQ tablet Take 1 tablet (10 meq) 4 days/week (Tuesday, Thursday, Saturday, Sunday) and take 2 tablets (20 meq) 3 days/week (Monday, Wednesday, Friday) 01/04/20  Yes [name], MD  RANEXA 1000 MG SR tablet Take 1 tablet (1,000 mg total) by mouth 2 (two) times daily. 01/15/20  Yes Emokpae, Courage, MD    Vitamin D, Cholecalciferol, 25 MCG (1000 UT) CAPS Take 1 tablet by mouth daily.   Yes [provider]  KLOR-CON M15 15 MEQ tablet Take 1 tablet by mouth twice daily Patient not taking: Reported on 04/08/2020 03/08/20   [name], MD  meclizine (ANTIVERT) 25 MG tablet Take 1 tablet (25 mg total) by mouth 3 (three) times daily  as needed for dizziness. 04/08/20   Milton Ferguson, MD  ondansetron (ZOFRAN ODT) 4 MG disintegrating tablet 4mg  ODT q4 hours prn nausea/vomit 04/08/20   Milton Ferguson, MD    Allergies    Vicodin [hydrocodone-acetaminophen], Amoxicillin, Demerol [meperidine], Lisinopril, and Lodine [etodolac]  Review of Systems   Review of Systems  Constitutional: Negative for appetite change and fatigue.  HENT: Negative for congestion, ear discharge and sinus pressure.   Eyes: Negative for discharge.  Respiratory: Negative for cough.   Cardiovascular: Negative for chest pain.  Gastrointestinal: Negative for abdominal pain and diarrhea.  Genitourinary: Negative for frequency and hematuria.  Musculoskeletal: Negative for back pain.  Skin: Negative for rash.  Neurological: Positive for dizziness. Negative for seizures and headaches.  Psychiatric/Behavioral: Negative for hallucinations.    Physical Exam Updated Vital Signs BP (!) 143/76   Pulse (!) 59   Temp 97.9 F (36.6 C) (Oral)   Resp 17   Ht 5' (1.524 m)   Wt 98 kg   SpO2 96%   BMI 42.18 kg/m   Physical Exam Vitals and nursing note reviewed.  Constitutional:      Appearance: She is well-developed.  HENT:     Head: Normocephalic.     Nose: Nose normal.  Eyes:     General: No scleral icterus.    Conjunctiva/sclera: Conjunctivae normal.  Neck:     Thyroid: No thyromegaly.  Cardiovascular:     Rate and Rhythm: Normal rate and regular rhythm.     Heart sounds: No murmur. No friction rub. No gallop.   Pulmonary:     Breath sounds: No stridor. No wheezing or rales.  Chest:     Chest wall: No  tenderness.  Abdominal:     General: There is no distension.     Tenderness: There is no abdominal tenderness. There is no rebound.  Musculoskeletal:        General: Normal range of motion.     Cervical back: Neck supple.  Lymphadenopathy:     Cervical: No cervical adenopathy.  Skin:    Findings: No erythema or rash.  Neurological:     Mental Status: She is alert and oriented to person, place, and time.     Motor: No abnormal muscle tone.     Coordination: Coordination normal.  Psychiatric:        Behavior: Behavior normal.     ED Results / Procedures / Treatments   Labs (all labs ordered are listed, but only abnormal results are displayed) Labs Reviewed  CBC WITH DIFFERENTIAL/PLATELET - Abnormal; Notable for the following components:      Result Value   RBC 3.53 (*)    Hemoglobin 11.3 (*)    HCT 34.6 (*)    All other components within normal limits  COMPREHENSIVE METABOLIC PANEL - Abnormal; Notable for the following components:   Glucose, Bld 115 (*)    Calcium 8.6 (*)    Albumin 3.4 (*)    All other components within normal limits  TROPONIN I (HIGH SENSITIVITY)  TROPONIN I (HIGH SENSITIVITY)    EKG EKG Interpretation  Date/Time:  Saturday Apr 08 2020 10:59:39 EDT Ventricular Rate:  62 PR Interval:    QRS Duration: 128 QT Interval:  592 QTC Calculation: 602 R Axis:   35 Text Interpretation: Sinus rhythm Ventricular premature complex Left bundle branch block Baseline wander in lead(s) V6 Confirmed by Milton Ferguson [phone]) on 04/08/2020 11:14:52 AM   Radiology CT Head Wo Contrast  Result Date: 04/08/2020 CLINICAL  DATA:  Dizziness with nausea and ataxia EXAM: CT HEAD WITHOUT CONTRAST TECHNIQUE: Contiguous axial images were obtained from the base of the skull through the vertex without intravenous contrast. COMPARISON:  December 06, 2016 FINDINGS: Brain: The ventricles and sulci are normal in size and configuration. There is no intracranial mass, hemorrhage,  extra-axial fluid collection, or midline shift. The brain parenchyma appears unremarkable. No evident acute infarct. Vascular: No hyperdense vessel.  No evident vascular calcification. Skull: Bony calvarium appears intact. Sinuses/Orbits: There is opacification in a posterior left ethmoid sinus. Other visualized paranasal sinuses are clear. Orbits appear symmetric bilaterally. Other: Mastoid air cells are clear. There is rightward deviation of the nasal septum. IMPRESSION: Opacification in a left posterior ethmoid air cell. Nasal septal deviation. Study otherwise unremarkable. Electronically Signed   By: Lowella Grip III M.D.   On: 04/08/2020 12:00   DG Chest Port 1 View  Result Date: 04/08/2020 CLINICAL DATA:  Dizziness with nausea and vomiting EXAM: PORTABLE CHEST 1 VIEW COMPARISON:  January 13, 2020 FINDINGS: Lungs are clear. Heart is mildly enlarged with pulmonary vascularity normal. No adenopathy. No bone lesions. IMPRESSION: Mild cardiomegaly.  Lungs clear. Electronically Signed   By: Lowella Grip III M.D.   On: 04/08/2020 11:29    Procedures Procedures (including critical care time)  Medications Ordered in ED Medications  sodium chloride 0.9 % bolus 1,000 mL (1,000 mLs Intravenous New Bag/Given 04/08/20 1131)  meclizine (ANTIVERT) tablet 25 mg (25 mg Oral Given 04/08/20 1135)  ondansetron (ZOFRAN) injection 4 mg (4 mg Intravenous Given 04/08/20 1136)    ED Course  I have reviewed the triage vital signs and the nursing notes.  Pertinent labs & imaging results that were available during my care of the patient were reviewed by me and considered in my medical decision making (see chart for details).    MDM Rules/Calculators/A&P                      Patient with vertigo symptoms.  We will place her on Antivert and Zofran.  he will follow up with ENT.  Patient also has left ossification of ethmoid sinus.  She does not have sinusitis symptoms.  he will see the ENT doctor for this  also     This patient presents to the ED for concern of dizziness, this involves an extensive number of treatment options, and is a complaint that carries with it a high risk of complications and morbidity.  The differential diagnosis includes stroke vertigo tumor   Lab Tests:  I Ordered, reviewed, and interpreted labs, which included CBC and chemistries which showed mild anemia Medicines ordered:   I ordered medication Antivert and Zofran for nausea and dizziness  Imaging Studies ordered:   I ordered imaging studies which included CT head and  I independently visualized and interpreted imaging which showed ethmoid sinus disease  Additional history obtained:   Additional history obtained from records  Previous records obtained and reviewed   Consultations Obtained:   Reevaluation:  After the interventions stated above, I reevaluated the patient and found improved  Critical Interventions:  .   Final Clinical Impression(s) / ED Diagnoses Final diagnoses:  Vertigo    Rx / DC Orders ED Discharge Orders         Ordered    ondansetron (ZOFRAN ODT) 4 MG disintegrating tablet     04/08/20 1327    meclizine (ANTIVERT) 25 MG tablet  3 times daily PRN  04/08/20 [place], MD 04/10/20 [phone]

## 2020-05-02 ENCOUNTER — Ambulatory Visit (INDEPENDENT_AMBULATORY_CARE_PROVIDER_SITE_OTHER): Payer: PPO | Admitting: Family Medicine

## 2020-05-02 ENCOUNTER — Encounter: Payer: Self-pay | Admitting: Family Medicine

## 2020-05-02 ENCOUNTER — Other Ambulatory Visit: Payer: Self-pay

## 2020-05-02 VITALS — BP 132/76 | HR 82 | Temp 98.6°F | Resp 16 | Ht 60.0 in | Wt 220.0 lb

## 2020-05-02 DIAGNOSIS — E890 Postprocedural hypothyroidism: Secondary | ICD-10-CM

## 2020-05-02 DIAGNOSIS — I2510 Atherosclerotic heart disease of native coronary artery without angina pectoris: Secondary | ICD-10-CM | POA: Diagnosis not present

## 2020-05-02 DIAGNOSIS — E785 Hyperlipidemia, unspecified: Secondary | ICD-10-CM

## 2020-05-02 DIAGNOSIS — E661 Drug-induced obesity: Secondary | ICD-10-CM | POA: Diagnosis not present

## 2020-05-02 DIAGNOSIS — R930 Abnormal findings on diagnostic imaging of skull and head, not elsewhere classified: Secondary | ICD-10-CM

## 2020-05-02 DIAGNOSIS — Z6841 Body Mass Index (BMI) 40.0 and over, adult: Secondary | ICD-10-CM

## 2020-05-02 DIAGNOSIS — R7302 Impaired glucose tolerance (oral): Secondary | ICD-10-CM

## 2020-05-02 DIAGNOSIS — I5042 Chronic combined systolic (congestive) and diastolic (congestive) heart failure: Secondary | ICD-10-CM

## 2020-05-02 DIAGNOSIS — I10 Essential (primary) hypertension: Secondary | ICD-10-CM | POA: Diagnosis not present

## 2020-05-02 NOTE — Progress Notes (Signed)
Subjective:    Patient ID: Maria Fry, female    DOB: 1951/08/05, 69 y.o.   MRN: [id number]  Patient presents for Follow-up (is fasting)   Pt here to f/u chronic medical problems  She was at ER on 5/8 had vertigo symptoms, nausea that occurred acutely. 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 She does remember falling after she fell asleep while on the medication, she had food on the stove when she fell asleep. Smoke was everywhere and she ran into the kitchen and slipped on the floor, had bruise on her right thigh and right upper arm  HTN- taking metoprolol  Hypothyroidism- taking 164mcg once a day per endocrinology   CAD- taking Ranexa, metoprolol, lasix, lipitor, she is near the donut whole and ranxa is costly    Glucose intolerance , last A1C 5.5% in Jan, weight up   She has ppt with Dr. Merlene Laughter tomorrow for her neuropathy   SHE HAS NOT been taking calcium or vitamin D    Review Of Systems:  GEN- denies fatigue, fever, weight loss,weakness, recent illness HEENT- denies eye drainage, change in vision, nasal discharge, CVS- denies chest pain, palpitations RESP- denies SOB, cough, wheeze ABD- denies N/V, change in stools, abd pain GU- denies dysuria, hematuria, dribbling, incontinence MSK- denies joint pain, muscle aches, injury Neuro- denies headache, dizziness, syncope, seizure activity       Objective:    BP 132/76   Pulse 82   Temp 98.6 F (37 C) (Temporal)   Resp 16   Ht 5' (1.524 m)   Wt 220 lb (99.8 kg)   SpO2 97%   BMI 42.97 kg/m  GEN- NAD, alert and oriented x3 HEENT- PERRL, EOMI, non injected sclera, pink conjunctiva, MMM, oropharynx clear,nares clear, no sinus tenderness, TM clear no effusion  Neck- Supple, no thyromegaly CVS- RRR, no murmur RESP-CTAB ABD-NABS,soft,NT,ND EXT- pedal  edema Pulses- Radial, DP- 2+        Assessment & Plan:      Problem List Items Addressed This Visit      Unprioritized   CAD (coronary artery disease)    He would benefit from continued Ranexa in order to control her angina symptoms.  As well to keep her out of the emergency room.  I have referred her to care management pharmacist to see if there is any program to help her get her medication.  Blood pressure is controlled.  Lipids at goal.  Cardiology visit back in March.      Relevant Orders   Ambulatory referral to Chronic Care Management Services   Chronic combined systolic and diastolic heart failure ([place])    She will continue her diuretic therapy.  She is at her baseline.  She has gained weight in general we have discussed cutting back on carbs and sweets.  She is also not as active as she was before.      Glucose intolerance (impaired glucose tolerance)    Dietary changes.  Recent A1c was normal at 5.5%.      Hyperlipidemia   Hypertension - Primary   Relevant Orders   Basic metabolic panel   Hypothyroidism following radioiodine therapy   Obesity    Other Visit Diagnoses    Opacification of ethmoid sinus       Referral to  ENT for evaluation .she does not have any pain or discomfort   Relevant Orders   Ambulatory referral to ENT   Hypocalcemia       Check PTH calcium vitamin D.  If abnormal will get her endocrinologist involved   Relevant Orders   PTH, Intact and Calcium   Vitamin D, 99991111   Basic metabolic panel      Note: This dictation was prepared with Dragon dictation along with smaller phrase technology. Any transcriptional errors that result from this process are unintentional.

## 2020-05-02 NOTE — Assessment & Plan Note (Signed)
She will continue her diuretic therapy.  She is at her baseline.  She has gained weight in general we have discussed cutting back on carbs and sweets.  She is also not as active as she was before.

## 2020-05-02 NOTE — Assessment & Plan Note (Signed)
Dietary changes.  Recent A1c was normal at 5.5%.

## 2020-05-02 NOTE — Patient Instructions (Addendum)
Referral to pharmacist for the Ranexa  Referral to ENT  F/U 4 MONTHS

## 2020-05-02 NOTE — Assessment & Plan Note (Addendum)
He would benefit from continued Ranexa in order to control her angina symptoms.  As well to keep her out of the emergency room.  I have referred her to care management pharmacist to see if there is any program to help her get her medication.  Blood pressure is controlled.  Lipids at goal.  Cardiology visit back in March.

## 2020-05-03 ENCOUNTER — Telehealth: Payer: Self-pay | Admitting: Family Medicine

## 2020-05-03 DIAGNOSIS — G603 Idiopathic progressive neuropathy: Secondary | ICD-10-CM | POA: Diagnosis not present

## 2020-05-03 DIAGNOSIS — R42 Dizziness and giddiness: Secondary | ICD-10-CM | POA: Diagnosis not present

## 2020-05-03 DIAGNOSIS — R2689 Other abnormalities of gait and mobility: Secondary | ICD-10-CM | POA: Diagnosis not present

## 2020-05-03 DIAGNOSIS — M79609 Pain in unspecified limb: Secondary | ICD-10-CM | POA: Diagnosis not present

## 2020-05-03 DIAGNOSIS — R569 Unspecified convulsions: Secondary | ICD-10-CM | POA: Diagnosis not present

## 2020-05-03 LAB — BASIC METABOLIC PANEL
BUN: 15 mg/dL (ref 7–25)
CO2: 28 mmol/L (ref 20–32)
Calcium: 8.8 mg/dL (ref 8.6–10.4)
Chloride: 106 mmol/L (ref 98–110)
Creat: 0.95 mg/dL (ref 0.50–0.99)
Glucose, Bld: 95 mg/dL (ref 65–99)
Potassium: 4.2 mmol/L (ref 3.5–5.3)
Sodium: 138 mmol/L (ref 135–146)

## 2020-05-03 LAB — PTH, INTACT AND CALCIUM
Calcium: 8.8 mg/dL (ref 8.6–10.4)
PTH: 55 pg/mL (ref 14–64)

## 2020-05-03 LAB — VITAMIN D 25 HYDROXY (VIT D DEFICIENCY, FRACTURES): Vit D, 25-Hydroxy: 11 ng/mL — ABNORMAL LOW (ref 30–100)

## 2020-05-03 NOTE — Chronic Care Management (AMB) (Signed)
  Chronic Care Management   Note  05/03/2020 Name: DALEIGH DIKEMAN MRN: [id number] DOB: [birth year]  [name] is a 69 y.o. year old female who is a primary care patient of [place], [name], MD. I reached out to [name] by phone today in response to a referral sent by Ms. Jasiel D Zepeda's PCP, [name], [name], MD.   Ms. Hackleman was given information about Chronic Care Management services today including:  1. CCM service includes personalized support from designated clinical staff supervised by her physician, including individualized plan of care and coordination with other care providers 2. 24/7 contact phone numbers for assistance for urgent and routine care needs. 3. Service will only be billed when office clinical staff spend 20 minutes or more in a month to coordinate care. 4. Only one practitioner may furnish and bill the service in a calendar month. 5. The patient may stop CCM services at any time (effective at the end of the month) by phone call to the office staff.   Patient agreed to services and verbal consent obtained.   Follow up plan:   [place]

## 2020-05-11 ENCOUNTER — Encounter: Payer: Self-pay | Admitting: *Deleted

## 2020-05-11 MED ORDER — VITAMIN D (ERGOCALCIFEROL) 1.25 MG (50000 UNIT) PO CAPS
50000.0000 [IU] | ORAL_CAPSULE | ORAL | 0 refills | Status: DC
Start: 2020-05-11 — End: 2020-08-23

## 2020-06-13 NOTE — Chronic Care Management (AMB) (Signed)
Chronic Care Management Pharmacy  Name: Maria Fry  MRN: [id number] DOB: Apr 21, 1951  Chief Complaint/ HPI  Maria Fry,  69 y.o. , female presents for their Initial CCM visit with the clinical pharmacist In office.  PCP : Alycia Rossetti, MD  Their chronic conditions include: hypertension, CAD, HF, hypothyroidism, DDD, hyperlipidemia, anemia.  Office Visits:  05/02/2020 [facility]) - f/u chronic conditions, referral to CCM for help with Ranexa, currently in donut hole and copay is very expensive  Consult Visit:  04/08/2020 [name], ED) - vertigo symptoms, given Antivert and Zofran, referred to ENT  Medications: Outpatient Encounter Medications as of 06/26/2020  Medication Sig Note  . acetaminophen (TYLENOL) 500 MG tablet Take 500 mg by mouth every 6 (six) hours as needed for mild pain or moderate pain. Reported on 04/22/2016   . aspirin 81 MG chewable tablet Chew 1 tablet (81 mg total) by mouth daily with breakfast.   . atorvastatin (LIPITOR) 80 MG tablet Take 1 tablet (80 mg total) by mouth at bedtime.   . ferrous sulfate 325 (65 FE) MG EC tablet Take 325 mg by mouth daily.   . furosemide (LASIX) 40 MG tablet Take 40 mg daily 4 days/week (Tuesday, Thursday, Saturday, Sunday) and take 40 mg twice daily 3 days/week (Monday, Wednesday and Friday)   . gabapentin (NEURONTIN) 300 MG capsule Take 300 mg by mouth 2 (two) times daily.    Marland Kitchen KLOR-CON M15 15 MEQ tablet Take 1 tablet by mouth twice daily   . levothyroxine (SYNTHROID) 100 MCG tablet TAKE 1 TABLET BY MOUTH ONCE DAILY BEFORE BREAKFAST   . loratadine (CLARITIN) 10 MG tablet Take 10 mg by mouth daily as needed for allergies.   . metoprolol tartrate (LOPRESSOR) 25 MG tablet Take 1 tablet (25 mg total) by mouth 2 (two) times daily.   . nitroGLYCERIN (NITROSTAT) 0.4 MG SL tablet DISSOLVE ONE TABLET UNDER THE TONGUE EVERY 5 MINUTES AS NEEDED FOR CHEST PAIN.  DO NOT EXCEED A TOTAL OF 3 DOSES IN 15 MINUTES 04/08/2020: Last used 01/06/20  .  ondansetron (ZOFRAN ODT) 4 MG disintegrating tablet 2m ODT q4 hours prn nausea/vomit   . potassium chloride (KLOR-CON) 10 MEQ tablet Take 1 tablet (10 meq) 4 days/week (Tuesday, Thursday, Saturday, Sunday) and take 2 tablets (20 meq) 3 days/week (Monday, Wednesday, Friday)   . RANEXA 1000 MG SR tablet Take 1 tablet (1,000 mg total) by mouth 2 (two) times daily.   . Vitamin D, Cholecalciferol, 25 MCG (1000 UT) CAPS Take 1 tablet by mouth daily.   . Vitamin D, Ergocalciferol, (DRISDOL) 1.25 MG (50000 UNIT) CAPS capsule Take 1 capsule (50,000 Units total) by mouth every 7 (seven) days. x12 weeks, then D/C. Begin over the counter Vit D 2000IU once Tx complete.   . [DISCONTINUED] EUTHYROX 100 MCG tablet TAKE 1 TABLET BY MOUTH ONCE DAILY BEFORE BREAKFAST    No facility-administered encounter medications on file as of 06/26/2020.     Current Diagnosis/Assessment:   FEmergency planning/management officerStrain: Low Risk   . Difficulty of Paying Living Expenses: Not very hard    Goals Addressed            This Visit's Progress   . Pharmacy Care Plan:       CARE PLAN ENTRY (see longitudinal plan of care for additional care plan information)  Current Barriers:  . Chronic Disease Management support, education, and care coordination needs related to Hypertension, Hyperlipidemia, Heart Failure, Coronary Artery Disease, and anemia  Hypertension BP Readings from Last 3 Encounters:  05/02/20 132/76  04/08/20 (!) 141/70  02/10/20 140/84   . Pharmacist Clinical Goal(s): o Over the next 180 days, patient will work with PharmD and providers to achieve BP goal <130/80 . Current regimen:  o Metoprolol 30m twice daily . Interventions: o Reviewed home monitoring procedures o Comprehensive medication review . Patient self care activities - Over the next 180 days, patient will: o Check BP periodically, document, and provide at future appointments o Ensure daily salt intake < 2300 mg/day  Hyperlipidemia Lab  Results  Component Value Date/Time   LDLCALC 63 01/03/2020 09:48 AM   . Pharmacist Clinical Goal(s): o Over the next 180 days, patient will work with PharmD and providers to maintain LDL goal < 70 . Current regimen:  o Atorvastatin 837mdaily . Interventions: o Reviewed most recent lipid panel o Counseled on importance of compliance with statin medications. . Patient self care activities - Over the next 180 days, patient will: o Continue to take medication as prescribed o Focus on adherence by pill box  Heart Failure . Pharmacist Clinical Goal(s) o Over the next 180 days, patient will work with PharmD and providers to optimize medication and minimize symptoms related to HF. . Current regimen:   Metoprolol tartrate 2580mid  Furosemide 77m11mily four times per week, and twice daily 3 times per week . Interventions: o Counseled on importance of monitoring weight daily . Patient self care activities - Over the next 180 days, patient will: o Begin to monitor weight daily as directed  o Contact providers if you gain more than 3 pounds in one day or 5 pounds in a week.   CAD . Pharmacist Clinical Goal(s) o Over the next 180 days, patient will work with PharmD and providers to optimize medication and minimize symptoms related to CAD. . CuMarland Kitchenrent regimen:  o ASA 81mg70maenxa 1000mg 44me daily . Interventions: o Discussed cost/affordability of Ranexa. o Researched other programs patient may be eligible for copay assistance. . Patient self care activities - Over the next 180 days, patient will: o Continue to take medication as she has been, wait for PharmD to reach out regarding programs for copay assistance.  Anemia . Pharmacist Clinical Goal(s) o Over the next 180 days, patient will work with PharmD and providers to optimize medication and minimize symptoms related to anemia. . Current regimen:  o Ferrous Sulfate 325mg d99m . Interventions: o Counseled on importance of  taking medication daily o Discussed switching to night time dosing if makes patient tired. . Patient self care activities - Over the next 180 days, patient will: o Begin to take medication in the evening to see if this helps with sleepiness  Initial goal documentation        Heart Failure   Type: Combined Systolic and Diastolic  Patient has failed these meds in past: none noted Patient is currently controlled on the following medications:   Metoprolol tartrate 25mg bi41murosemide 77mg dai93mour times per week, and twice daily 3 times per week  We discussed weighing daily; if you gain more than 3 pounds in one day or 5 pounds in one week call your doctor.  Patient not currently monitoring weight like she should and does report swelling if she sits for a long period of time even with Lasix.  Plan  Continue current medications, begin monitoring weight more regularly and contact providers if you gain more than 3 pounds in one day  or 5 pounds in a week.   Hypertension     Office blood pressures are  BP Readings from Last 3 Encounters:  05/02/20 132/76  04/08/20 (!) 141/70  02/10/20 140/84    Patient has failed these meds in the past: lisinopril (swollen throat)  Patient checks BP at home infrequently  Patient home BP readings are ranging: no logs present  Patient is currently controlled on the following medications:   Metoprolol 18m twice daily  Reports normal BP whenever she checks at home.  Office blood pressures have been mostly WNL.  Denies chest pain/dizziness.  Reports compliance with medication.  Plan  Continue current medications.     Hyperlipidemia   LDL goal < 70  Lipid Panel     Component Value Date/Time   CHOL 123 01/03/2020 0948   TRIG 68 01/03/2020 0948   HDL 46 (L) 01/03/2020 0948   LDLCALC 63 01/03/2020 0948    Hepatic Function Latest Ref Rng & Units 04/08/2020 01/14/2020 01/13/2020  Total Protein 6.5 - 8.1 g/dL 7.6 7.9 8.1  Albumin 3.5  - 5.0 g/dL 3.4(L) 3.5 3.7  AST 15 - 41 U/L 20 33 24  ALT 0 - 44 U/L 18 27 22   Alk Phosphatase 38 - 126 U/L 86 75 85  Total Bilirubin 0.3 - 1.2 mg/dL 0.6 0.3 0.6  Bilirubin, Direct 0.0 - 0.2 mg/dL - - -     The ASCVD Risk score (GBohemia, et al., 2013) failed to calculate for the following reasons:   The patient has a prior MI or stroke diagnosis   Patient has failed these meds in past: none noted Patient is currently controlled on the following medications:  . Atorvastatin 84mdaily  We discussed:  LDL controlled, patient reports compliance.  Plan  Continue current medications.  CAD    Patient has failed these meds in past: none noted Patient is currently controlled on the following medications:   ASA 8174mRanexa 1000m30mice daily  Patient is currently controlled with no chest pain.  Does report that she is currently in donut hole and Ranexa copay is > $500.  She cannot tolerate generic as it made her throat swell.  Will review other options for funding before alternative medication.  Patient has started taking once daily in order to lengthen her supply of medication.  Reports no chest pain even with this dose.  Plan  Continue current medications.  Will research all possible solutions for funding since patient is stable on this medication.  Anemia   Patient has failed these meds in past: none noted Patient is currently uncontrolled on the following medications:  . Ferrous Sulfate 325 mg  Patient not currently taking medication due to it making her sleepy.  Discussed that if this is the case she could switch to night time.  Patient reports she is going to switch this any try starting tonight.  Plan  Continue current medications, switch medication to night time. DDD   Patient has failed these meds in past: Vicodin, Lodine (rash) Patient is currently controlled on the following medications:  . Gabapentin 300mg33mce daily  Pain is manageable, no side  effects.  Plan  Continue current medications.  Hypothyroidism   Lab Results  Component Value Date/Time   TSH 3.58 01/11/2020 09:00 AM   TSH 4.20 12/29/2018 08:24 AM   FREET4 1.4 01/11/2020 09:00 AM   FREET4 1.3 12/29/2018 08:24 AM    Patient has failed these meds in past: none noted  Patient is currently controlled on the following medications:  . Levothyroxine 128mg  TSH normal, patient reports compliance and appropriate timing.  No symptoms noted.  Plan  Continue current medications  Vaccines   Reviewed and discussed patient's vaccination history.    Immunization History  Administered Date(s) Administered  . Fluad Quad(high Dose 65+) 10/21/2019  . Influenza, High Dose Seasonal PF 08/24/2018  . Influenza,inj,Quad PF,6+ Mos 10/02/2015, 08/19/2016, 08/25/2017, 09/02/2019  . PFIZER SARS-COV-2 Vaccination 01/24/2020, 02/14/2020  . Pneumococcal Conjugate-13 01/01/2019  . Pneumococcal Polysaccharide-23 01/03/2020    Plan  Recommended patient receive Shingles vaccine in office.  Medication Management   . Miscellaneous medications: o Klor-Con 10 meq tablet o Nitrostat 0.433m. OTC's:  o Tylenol 50048mrn o Loratadine 76m6mPatient currently uses [facility] #  ([id number] reports using pill box method to organize medications and promote adherence. . Patient denies missed doses of medication.   ChriBeverly MilcharmD Clinical Pharmacist [phone]

## 2020-06-14 ENCOUNTER — Other Ambulatory Visit: Payer: Self-pay | Admitting: "Endocrinology

## 2020-06-26 ENCOUNTER — Other Ambulatory Visit: Payer: Self-pay

## 2020-06-26 ENCOUNTER — Ambulatory Visit: Payer: PPO | Admitting: Pharmacist

## 2020-06-26 DIAGNOSIS — I5042 Chronic combined systolic (congestive) and diastolic (congestive) heart failure: Secondary | ICD-10-CM

## 2020-06-26 DIAGNOSIS — I10 Essential (primary) hypertension: Secondary | ICD-10-CM

## 2020-06-26 DIAGNOSIS — D649 Anemia, unspecified: Secondary | ICD-10-CM

## 2020-06-26 DIAGNOSIS — I2510 Atherosclerotic heart disease of native coronary artery without angina pectoris: Secondary | ICD-10-CM

## 2020-06-26 DIAGNOSIS — E785 Hyperlipidemia, unspecified: Secondary | ICD-10-CM

## 2020-06-26 NOTE — Patient Instructions (Addendum)
Visit Information Thank you for meeting with me today!  I look forward to working with you to help you meet all of your healthcare goals and answer any questions you may have.  Feel free to contact me anytime!  Goals Addressed            This Visit's Progress   . Pharmacy Care Plan:       CARE PLAN ENTRY (see longitudinal plan of care for additional care plan information)  Current Barriers:  . Chronic Disease Management support, education, and care coordination needs related to Hypertension, Hyperlipidemia, Heart Failure, Coronary Artery Disease, and anemia   Hypertension BP Readings from Last 3 Encounters:  05/02/20 132/76  04/08/20 (!) 141/70  02/10/20 140/84   . Pharmacist Clinical Goal(s): o Over the next 180 days, patient will work with PharmD and providers to achieve BP goal <130/80 . Current regimen:  o Metoprolol 25mg  twice daily . Interventions: o Reviewed home monitoring procedures o Comprehensive medication review . Patient self care activities - Over the next 180 days, patient will: o Check BP periodically, document, and provide at future appointments o Ensure daily salt intake < 2300 mg/day  Hyperlipidemia Lab Results  Component Value Date/Time   LDLCALC 63 01/03/2020 09:48 AM   . Pharmacist Clinical Goal(s): o Over the next 180 days, patient will work with PharmD and providers to maintain LDL goal < 70 . Current regimen:  o Atorvastatin 80mg  daily . Interventions: o Reviewed most recent lipid panel o Counseled on importance of compliance with statin medications. . Patient self care activities - Over the next 180 days, patient will: o Continue to take medication as prescribed o Focus on adherence by pill box  Heart Failure . Pharmacist Clinical Goal(s) o Over the next 180 days, patient will work with PharmD and providers to optimize medication and minimize symptoms related to HF. . Current regimen:   Metoprolol tartrate 25mg  bid  Furosemide 40mg   daily four times per week, and twice daily 3 times per week . Interventions: o Counseled on importance of monitoring weight daily . Patient self care activities - Over the next 180 days, patient will: o Begin to monitor weight daily as directed  o Contact providers if you gain more than 3 pounds in one day or 5 pounds in a week.   CAD . Pharmacist Clinical Goal(s) o Over the next 180 days, patient will work with PharmD and providers to optimize medication and minimize symptoms related to CAD. Marland Kitchen Current regimen:  o ASA 81mg  o Raenxa 1000mg  twice daily . Interventions: o Discussed cost/affordability of Ranexa. o Researched other programs patient may be eligible for copay assistance. . Patient self care activities - Over the next 180 days, patient will: o Continue to take medication as she has been, wait for PharmD to reach out regarding programs for copay assistance.  Anemia . Pharmacist Clinical Goal(s) o Over the next 180 days, patient will work with PharmD and providers to optimize medication and minimize symptoms related to anemia. . Current regimen:  o Ferrous Sulfate 325mg  daily . Interventions: o Counseled on importance of taking medication daily o Discussed switching to night time dosing if makes patient tired. . Patient self care activities - Over the next 180 days, patient will: o Begin to take medication in the evening to see if this helps with sleepiness  Initial goal documentation        Maria Fry was given information about Chronic Care Management services today including:  1. CCM service includes personalized support from designated clinical staff supervised by her physician, including individualized plan of care and coordination with other care providers 2. 24/7 contact phone numbers for assistance for urgent and routine care needs. 3. Standard insurance, coinsurance, copays and deductibles apply for chronic care management only during months in which we provide  at least 20 minutes of these services. Most insurances cover these services at 100%, however patients may be responsible for any copay, coinsurance and/or deductible if applicable. This service may help you avoid the need for more expensive face-to-face services. 4. Only one practitioner may furnish and bill the service in a calendar month. 5. The patient may stop CCM services at any time (effective at the end of the month) by phone call to the office staff.  Patient agreed to services and verbal consent obtained.   The patient verbalized understanding of instructions provided today and agreed to receive a mailed copy of patient instruction and/or educational materials. Telephone follow up appointment with pharmacy team member scheduled for: 6 months Beverly Milch, PharmD Clinical Pharmacist [facility] [phone]   Heart Failure, Self Care Heart failure is a serious condition. This document explains the things you need to do to take care of yourself after a heart failure diagnosis. You may be asked to change your diet, take certain medicines, and make other lifestyle changes in order to stay as healthy as possible. Your health care provider may also give you more specific instructions. If you have problems or questions, contact your health care provider. What are the risks? Having heart failure puts you at higher risk for certain problems. These problems can get worse if you do not take good care of yourself. Problems may include:  Blood clotting problems. This may cause a stroke.  Damage to the kidneys, liver, or lungs.  Abnormal heart rhythms. Supplies needed:  Scale for monitoring weight.  Blood pressure monitor.  Notebook.  Medicines. How to care for yourself when you have heart failure Medicines Take over-the-counter and prescription medicines only as told by your health care provider. Medicines reduce the workload of your heart, slow the progression of  heart failure, and improve symptoms. Take your medicines every day.  Do not stop taking your medicine unless your health care provider tells you to do so.  Do not skip any dose of medicine.  Refill your prescriptions before you run out of medicine. Eating and drinking   Eat heart-healthy foods. Talk with a dietitian to make an eating plan that is right for you. ? Choose foods that contain no trans fat and are low in saturated fat and cholesterol. Healthy choices include fresh or frozen fruits and vegetables, fish, lean meats, legumes, fat-free or low-fat dairy products, and whole-grain or high-fiber foods. ? Limit salt (sodium) if told by your health care provider. Sodium restriction may reduce symptoms of heart failure. Ask a dietitian to recommend heart-healthy seasonings. ? Use healthy cooking methods instead of frying. Healthy methods include roasting, grilling, broiling, baking, poaching, steaming, and stir-frying.  Limit your fluid intake, if directed by your health care provider. Fluid restriction may reduce symptoms of heart failure. Alcohol use  Do not drink alcohol if: ? Your health care provider tells you not to drink. ? Your heart was damaged by alcohol, or you have severe heart failure. ? You are pregnant, may be pregnant, or are planning to become pregnant.  If you drink alcohol: ? Limit how much you use to:  0-1 drink a day for women.  0-2 drinks a day for men. ? Be aware of how much alcohol is in your drink. In the [place] one drink equals one 12 oz bottle of beer (355 mL), one 5 oz glass of wine (148 mL), or one 1 oz glass of hard liquor (44 mL). Lifestyle   Do not use any products that contain nicotine or tobacco, such as cigarettes, e-cigarettes, and chewing tobacco. If you need help quitting, ask your health care provider. ? Do not use nicotine gum or patches before talking to your health care provider.  Do not use illegal drugs.  Work with your health care  provider to safely reach the right body weight.  Do physical activity if told by your health care provider. Talk to your health care provider before you begin an exercise if: ? You are an older adult. ? You have severe heart failure.  Learn to manage stress. If you need help to do this, ask your health care provider.  Participate in or seek rehabilitation as needed to keep or improve your independence and quality of life.  Plan rest periods when you get tired. Monitoring important information   Weigh yourself every day. This will help you to notice if too much fluid is building up in your body. ? Weigh yourself every morning after you urinate and before you eat breakfast. ? Wear the same amount of clothing each time you weigh yourself. ? Record your daily weight. Provide your health care provider with your weight record.  Monitor and record your pulse and blood pressure as told by your health care provider. Dealing with extreme temperatures  If the weather is extremely hot: ? Avoid vigorous physical activity. ? Use air conditioning or fans, or find a cooler location. ? Avoid caffeine and alcohol. ? Wear loose-fitting, lightweight, and light-colored clothing.  If the weather is extremely cold: ? Avoid vigorous activity. ? Layer your clothes. ? Wear mittens or gloves, a hat, and a scarf when you go outside. ? Avoid alcohol. Follow these instructions at home:  Stay up to date with vaccines. Pneumococcal and flu (influenza) vaccines are especially important in preventing infections of the airways.  Keep all follow-up visits as told by your health care provider. This is important. Contact a health care provider if you:  Have a rapid weight gain.  Have increasing shortness of breath.  Are unable to participate in your usual physical activities.  Get tired easily.  Cough more than normal, especially with physical activity.  Lose your appetite or feel nauseous.  Have any  swelling or more swelling in areas such as your hands, feet, ankles, or abdomen.  Are unable to sleep because it is hard to breathe.  Feel like your heart is beating quickly (palpitations).  Become dizzy or light-headed when you stand up. Get help right away if you:  Have trouble breathing.  Notice or your family notices a change in your awareness, such as having trouble staying awake or concentrating.  Have pain or discomfort in your chest.  Have an episode of fainting (syncope). These symptoms may represent a serious problem that is an emergency. Do not wait to see if the symptoms will go away. Get medical help right away. Call your local emergency services (911 in the [place] Do not drive yourself to the hospital. Summary  Heart failure is a serious condition. To care for yourself, you may be asked to change your diet, take certain medicines, and make  other lifestyle changes.  Take your medicines every day. Do not stop taking them unless your health care provider tells you to do so.  Eat heart-healthy foods, such as fresh or frozen fruits and vegetables, fish, lean meats, legumes, fat-free or low-fat dairy products, and whole-grain or high-fiber foods.  Ask your health care provider if you have any alcohol restrictions. You may have to stop drinking alcohol if you have severe heart failure.  Contact your health care provider if you notice problems, such as rapid weight gain or a fast heartbeat. Get help right away if you faint, or have chest pain or trouble breathing. This information is not intended to replace advice given to you by your health care provider. Make sure you discuss any questions you have with your health care provider. Document Revised: 03/02/2019 Document Reviewed: 03/03/2019 Elsevier Patient Education  [place].

## 2020-06-27 ENCOUNTER — Telehealth: Payer: Self-pay | Admitting: Pharmacist

## 2020-06-27 ENCOUNTER — Other Ambulatory Visit: Payer: Self-pay | Admitting: *Deleted

## 2020-06-27 DIAGNOSIS — I5042 Chronic combined systolic (congestive) and diastolic (congestive) heart failure: Secondary | ICD-10-CM

## 2020-06-27 DIAGNOSIS — E785 Hyperlipidemia, unspecified: Secondary | ICD-10-CM

## 2020-06-27 DIAGNOSIS — D649 Anemia, unspecified: Secondary | ICD-10-CM

## 2020-06-27 DIAGNOSIS — I214 Non-ST elevation (NSTEMI) myocardial infarction: Secondary | ICD-10-CM

## 2020-06-27 DIAGNOSIS — R7302 Impaired glucose tolerance (oral): Secondary | ICD-10-CM

## 2020-06-27 DIAGNOSIS — I10 Essential (primary) hypertension: Secondary | ICD-10-CM

## 2020-06-27 DIAGNOSIS — E890 Postprocedural hypothyroidism: Secondary | ICD-10-CM

## 2020-06-27 NOTE — Progress Notes (Signed)
Working on getting patient assistance with Ranexa, spoke with Pharmacist Leata Mouse, patient expressed that she could not afford her medication.   1.Called Rx Outreach program, they can supply patient with the generic of Ranexa for $63 a month.  2. Found a Ranexa Coupon online through [employer] where patient will only have to pay $49 per month.  3. Found application for Ranexa patient assistance but number I called to inquire about the program does not work.  [place] Advancing Access program who Ranexa Assistance program noted on their forms, Spoke with Jed, he was trying to help me with some options on this medication but he did not see that medication on file. On the phone with [place] for 70mins before he finally found that the [place] was discontinued on 12/05/2018.  5. Will look into [employer] for a grant to help in medication cost.

## 2020-08-09 ENCOUNTER — Encounter: Payer: Self-pay | Admitting: Internal Medicine

## 2020-08-16 ENCOUNTER — Telehealth: Payer: PPO | Admitting: Cardiovascular Disease

## 2020-08-17 NOTE — Progress Notes (Signed)
Cardiology Office Note   Date:  08/18/2020   ID:  Maria Fry, DOB [birth year], MRN [id number]  PCP:  Alycia Rossetti, MD Cardiologist:  Kate Sable, MD (Inactive)  02/10/2020 Electrphysiologist: None Rosaria Ferries, PA-C   No chief complaint on file.   History of Present Illness: Maria Fry is a 69 y.o. female with a history of CAD w/ dLAD 100% (med rx), S-D-CHF, HTN, HLD, hypothyroid  Maria Fry presents for 6 month follow up  She has problems with daytime LE edema. She does not add salt, tries to rinse canned foods, but still gets it. She does not wake with it.   Her current Lasix schedule is keeping most of the fluid off. Her weight has been stable between 215 & 220 lbs. The fluid accumulates when she sits if she does not put her feet up. The Lasix makes her urinate very frequently.    She describes waking in the night with nasal stuffiness and/or SOB. She snores. She falls asleep during the day or any time she has to sit still for a while.   She has problems with fatigue, gets extremely tired even with just taking out the trash.   She has had some brief chest pains, at different places, they resolve spontaneously. They tend to be sharp, do not last long.  She has never been told of an irregular HR, no hx palpitations. No presyncope or syncope  COVID status: vaccinated, did not have COVID  Past Medical History:  Diagnosis Date  . CAD (coronary artery disease)    a. 06/07/15 NSTEMI/Cath: Dist LAD 100%, mid LAD 10%. Otw nl cors. Nl EF w/ inferoapical AK.  Marland Kitchen Chronic combined systolic (congestive) and diastolic (congestive) heart failure ([place]) 04/23/2016   a. 04/2016 Echo: EF 45%, Gr2 DD; b. 11/2019 Echo: EF 45-50%, mod LVH. Gr2 DD. Nl RV fxn. Sev dil LA.   . Essential hypertension   . Hip pain   . Hyperlipidemia   . Hypothyroidism   . MI (myocardial infarction) ([place]) 06/05/2015  . Vitamin D deficiency     Past Surgical History:  Procedure Laterality Date    . ABDOMINAL HYSTERECTOMY    . CARDIAC CATHETERIZATION N/A 06/06/2015   Procedure: Left Heart Cath and Coronary Angiography;  Surgeon: Peter M Martinique, MD;  Location: [facility];  Service: Cardiovascular;  Laterality: N/A;  . CHOLECYSTECTOMY    . COLONOSCOPY    . COLONOSCOPY N/A 08/13/2017   Procedure: COLONOSCOPY;  Surgeon: Daneil Dolin, MD;  [facility];  Service: Endoscopy;  Laterality: N/A;  8:30 AM  . ESOPHAGOGASTRODUODENOSCOPY N/A 09/11/2016   Procedure: ESOPHAGOGASTRODUODENOSCOPY (EGD);  Surgeon: Daneil Dolin, MD;  Location: [facility];  Service: Endoscopy;  Laterality: N/A;  7:45 am - moved to 10/11 @ 10:30  . Heel spurs    . MALONEY DILATION N/A 09/11/2016   Procedure: Venia Minks DILATION;  Surgeon: Daneil Dolin, MD;  Location: [facility];  Service: Endoscopy;  Laterality: N/A;  . POLYPECTOMY  08/13/2017   Procedure: POLYPECTOMY;  Surgeon: Daneil Dolin, MD;  Location[facility];  Service: Endoscopy;;  colon  . SHOULDER ARTHROSCOPY    . TUBAL LIGATION      Current Outpatient Medications  Medication Sig Dispense Refill  . acetaminophen (TYLENOL) 500 MG tablet Take 500 mg by mouth every 6 (six) hours as needed for mild pain or moderate pain. Reported on 04/22/2016    . aspirin 81  MG chewable tablet Chew 1 tablet (81 mg total) by mouth daily with breakfast. 30 tablet 2  . atorvastatin (LIPITOR) 80 MG tablet Take 1 tablet (80 mg total) by mouth at bedtime. 90 tablet 3  . ferrous sulfate 325 (65 FE) MG EC tablet Take 325 mg by mouth daily.    . furosemide (LASIX) 40 MG tablet Take 40 mg daily 4 days/week (Tuesday, Thursday, Saturday, Sunday) and take 40 mg twice daily 3 days/week (Monday, Wednesday and Friday) 120 tablet 6  . gabapentin (NEURONTIN) 300 MG capsule Take 300 mg by mouth 2 (two) times daily.     Marland Kitchen KLOR-CON M15 15 MEQ tablet Take 1 tablet by mouth twice daily 180 tablet 3  . levothyroxine (SYNTHROID) 100 MCG tablet TAKE 1 TABLET BY MOUTH ONCE  DAILY BEFORE BREAKFAST 90 tablet 2  . loratadine (CLARITIN) 10 MG tablet Take 10 mg by mouth daily as needed for allergies.    . metoprolol tartrate (LOPRESSOR) 25 MG tablet Take 1 tablet (25 mg total) by mouth 2 (two) times daily. 180 tablet 3  . nitroGLYCERIN (NITROSTAT) 0.4 MG SL tablet DISSOLVE ONE TABLET UNDER THE TONGUE EVERY 5 MINUTES AS NEEDED FOR CHEST PAIN.  DO NOT EXCEED A TOTAL OF 3 DOSES IN 15 MINUTES 25 tablet 3  . ondansetron (ZOFRAN ODT) 4 MG disintegrating tablet 4mg  ODT q4 hours prn nausea/vomit 12 tablet 0  . potassium chloride (KLOR-CON) 10 MEQ tablet Take 1 tablet (10 meq) 4 days/week (Tuesday, Thursday, Saturday, Sunday) and take 2 tablets (20 meq) 3 days/week (Monday, Wednesday, Friday) 40 tablet 0  . Vitamin D, Cholecalciferol, 25 MCG (1000 UT) CAPS Take 1 tablet by mouth daily.    . Vitamin D, Ergocalciferol, (DRISDOL) 1.25 MG (50000 UNIT) CAPS capsule Take 1 capsule (50,000 Units total) by mouth every 7 (seven) days. x12 weeks, then D/C. Begin over the counter Vit D 2000IU once Tx complete. 12 capsule 0   No current facility-administered medications for this visit.    Allergies:   Vicodin [hydrocodone-acetaminophen], Amoxicillin, Demerol [meperidine], Lisinopril, and Lodine [etodolac]    Social History:  The patient  reports that she quit smoking about 17 years ago. Her smoking use included cigarettes. She started smoking about 42 years ago. She has never used smokeless tobacco. She reports that she does not drink alcohol and does not use drugs.   Family History:  The patient's family history includes Cancer in her brother; Heart attack in her brother and father; Heart failure in her brother and father; Stroke in her sister.  She indicated that her mother is deceased. She indicated that the status of her father is unknown. She indicated that the status of her sister is unknown. She indicated that the status of her neg hx is unknown.   ROS:  Please see the history of  present illness. All other systems are reviewed and negative.    PHYSICAL EXAM: VS:  BP 136/80   Pulse 94   Ht 5' (1.524 m)   Wt 220 lb (99.8 kg)   SpO2 98%   BMI 42.97 kg/m  , BMI Body mass index is 42.97 kg/m. GEN: Well nourished, well developed, female in no acute distress HEENT: normal for age  Neck: no JVD, no carotid bruit, no masses Cardiac: Irreg R&R; soft murmur, no rubs, or gallops Respiratory: decreased BS bases bilaterally, normal work of breathing GI: soft, nontender, nondistended, + BS MS: no deformity or atrophy; 1+ LE edema; distal pulses are 2+ in all  4 extremities  Skin: warm and dry, no rash Neuro:  Strength and sensation are intact Psych: euthymic mood, full affect   EKG:  EKG is ordered today. The ekg ordered today demonstrates Atrial fibrillation, HR 69, no acute ischemic changes  ECHO: 01/14/2020 1. Left ventricular ejection fraction, by estimation, is 60 to 65%. The  left ventricle has normal function. The left ventricle has no regional  wall motion abnormalities. There is severely increased left ventricular  hypertrophy. Left ventricular  diastolic parameters are consistent with Grade I diastolic dysfunction  (impaired relaxation).  2. Right ventricular systolic function is normal. The right ventricular  size is normal. There is mildly elevated pulmonary artery systolic  pressure.  3. Left atrial size was severely dilated.  4. Right atrial size was severely dilated.  5. The mitral valve is normal in structure and function. Mild mitral  valve regurgitation. No evidence of mitral stenosis.  6. The aortic valve is tricuspid. Aortic valve regurgitation is not  visualized. No aortic stenosis is present.  7. The inferior vena cava is normal in size with <50% respiratory  variability, suggesting right atrial pressure of 8 mmHg.   CATH: 06/06/2015  Dist LAD lesion, 100% stenosed.  Mid LAD lesion, 10% stenosed.  The left ventricular systolic  function is normal.   1. 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 Good LV function  Plan: DAPT, beta blocker and risk factor modification.   Recent Labs: 01/11/2020: TSH 3.58 01/14/2020: B Natriuretic Peptide 556.0; Magnesium 1.9 04/08/2020: ALT 18 08/18/2020: BUN 19; Creatinine, Ser 1.11; Hemoglobin 10.7; Platelets 297; Potassium 3.7; Sodium 135  CBC    Component Value Date/Time   WBC 7.5 08/18/2020 1502   RBC 3.70 (L) 08/18/2020 1502   HGB 10.7 (L) 08/18/2020 1502   HCT 33.9 (L) 08/18/2020 1502   PLT 297 08/18/2020 1502   MCV 91.6 08/18/2020 1502   MCH 28.9 08/18/2020 1502   MCHC 31.6 08/18/2020 1502   RDW 16.8 (H) 08/18/2020 1502   LYMPHSABS 1.3 04/08/2020 1130   MONOABS 0.6 04/08/2020 1130   EOSABS 0.1 04/08/2020 1130   BASOSABS 0.0 04/08/2020 1130   CMP Latest Ref Rng & Units 08/18/2020 05/02/2020 05/02/2020  Glucose 70 - 99 mg/dL 122(H) 95 -  BUN 8 - 23 mg/dL 19 15 -  Creatinine 0.44 - 1.00 mg/dL 1.11(H) 0.95 -  Sodium 135 - 145 mmol/L 135 138 -  Potassium 3.5 - 5.1 mmol/L 3.7 4.2 -  Chloride 98 - 111 mmol/L 102 106 -  CO2 22 - 32 mmol/L 26 28 -  Calcium 8.9 - 10.3 mg/dL 8.4(L) 8.8 8.8  Total Protein 6.5 - 8.1 g/dL - - -  Total Bilirubin 0.3 - 1.2 mg/dL - - -  Alkaline Phos 38 - 126 U/L - - -  AST 15 - 41 U/L - - -  ALT 0 - 44 U/L - - -     Lipid Panel Lab Results  Component Value Date   CHOL 123 01/03/2020   HDL 46 (L) 01/03/2020   LDLCALC 63 01/03/2020   TRIG 68 01/03/2020   CHOLHDL 2.7 01/03/2020      Wt Readings from Last 3 Encounters:  08/18/20 220 lb (99.8 kg)  05/02/20 220 lb (99.8 kg)  04/08/20 216 lb (98 kg)     Other studies Reviewed: Additional studies/ records that were reviewed today include: Office notes, hospital records and testing.  ASSESSMENT AND PLAN:  1.  Chronic combined CHF -She has  mild volume overload by exam -She says that she has daytime edema only, does not wake with it. -She really tries to watch the  sodium. -She admits to occasional dietary indiscretions, but not frequent. -She is compliant with her Lasix dose and she states that it works on her, she urinates very frequently after she takes it. -If at all possible, she does not wish to increase the dose of Lasix -I discussed support stockings or compression stockings with her, she will try them. -The atrial fibrillation may be contributing to her dyspnea on exertion - Recheck an echo  2. ?OSA -She filled out the Epworth Sleepiness Scale and has a significantly elevated score of 24.  -She will need a sleep study as an outpatient.  3.  Atrial fibrillation -Of unknown duration, but I suspect it is contributing to her fatigue and dyspnea on exertion -Although her rate is controlled, she is still symptomatic -I believe we should pursue sinus rhythm. -Continue metoprolol -CHA2DS2-VASc is 24 (age x1, female, CAD, CHF, HTN) -We discussed anticoagulation for 3 weeks and then cardioversion versus TEE cardioversion.  She prefers to wait 3 weeks and get a cardioversion. -I will start Eliquis 5 mg twice daily with 30 days for free.  She was given assistance forms to fill out as well.  At some point, she may have to switch to Coumadin for financial reasons.  However, if she can get at least 2 months of the Eliquis, that will get her through the cardioversion.  4.  Hypothyroid: -She is compliant with her Synthroid 100 mcg daily -This is generally followed by her PCP, but with the atrial fibrillation, will check a TSH  5.  CAD: -She is not having any ischemic symptoms. -Continue aspirin, high-dose statin, beta-blocker. -Check an echocardiogram, but no ischemic testing needed at this time.  Current medicines are reviewed at length with the patient today.  The patient does not have concerns regarding medicines.  The following changes have been made:  Add Eliquis  Labs/ tests ordered today include:   Orders Placed This Encounter  Procedures  .  Flu Vaccine QUAD High Dose(Fluad)  . TSH  . CBC  . Basic Metabolic Panel (BMET)  . EKG 12-Lead     Disposition:   FU with Kate Sable, MD (Inactive) or other MD  Signed, Rosaria Ferries, PA-C  08/18/2020 4:30 PM    [facility] Phone: [phone]; Fax: [phone]

## 2020-08-18 ENCOUNTER — Encounter: Payer: Self-pay | Admitting: Physician Assistant

## 2020-08-18 ENCOUNTER — Ambulatory Visit: Payer: PPO | Admitting: Physician Assistant

## 2020-08-18 ENCOUNTER — Other Ambulatory Visit (HOSPITAL_COMMUNITY)
Admission: RE | Admit: 2020-08-18 | Discharge: 2020-08-18 | Disposition: A | Discharge status: Home / Self Care | Payer: PPO | Source: Ambulatory Visit | Attending: Physician Assistant | Admitting: Physician Assistant

## 2020-08-18 ENCOUNTER — Other Ambulatory Visit: Payer: Self-pay

## 2020-08-18 VITALS — BP 136/80 | HR 94 | Ht 60.0 in | Wt 220.0 lb

## 2020-08-18 DIAGNOSIS — Z79899 Other long term (current) drug therapy: Secondary | ICD-10-CM | POA: Diagnosis not present

## 2020-08-18 DIAGNOSIS — I4819 Other persistent atrial fibrillation: Secondary | ICD-10-CM | POA: Diagnosis not present

## 2020-08-18 DIAGNOSIS — E039 Hypothyroidism, unspecified: Secondary | ICD-10-CM

## 2020-08-18 DIAGNOSIS — Z23 Encounter for immunization: Secondary | ICD-10-CM

## 2020-08-18 DIAGNOSIS — R0683 Snoring: Secondary | ICD-10-CM | POA: Diagnosis not present

## 2020-08-18 DIAGNOSIS — I2510 Atherosclerotic heart disease of native coronary artery without angina pectoris: Secondary | ICD-10-CM

## 2020-08-18 DIAGNOSIS — I5042 Chronic combined systolic (congestive) and diastolic (congestive) heart failure: Secondary | ICD-10-CM | POA: Diagnosis not present

## 2020-08-18 LAB — BASIC METABOLIC PANEL
Anion gap: 7 (ref 5–15)
BUN: 19 mg/dL (ref 8–23)
CO2: 26 mmol/L (ref 22–32)
Calcium: 8.4 mg/dL — ABNORMAL LOW (ref 8.9–10.3)
Chloride: 102 mmol/L (ref 98–111)
Creatinine, Ser: 1.11 mg/dL — ABNORMAL HIGH (ref 0.44–1.00)
GFR calc Af Amer: 60 mL/min — ABNORMAL LOW (ref >60)
GFR calc non Af Amer: 51 mL/min — ABNORMAL LOW (ref >60)
Glucose, Bld: 122 mg/dL — ABNORMAL HIGH (ref 70–99)
Potassium: 3.7 mmol/L (ref 3.5–5.1)
Sodium: 135 mmol/L (ref 135–145)

## 2020-08-18 LAB — CBC
HCT: 33.9 % — ABNORMAL LOW (ref 36.0–46.0)
Hemoglobin: 10.7 g/dL — ABNORMAL LOW (ref 12.0–15.0)
MCH: 28.9 pg (ref 26.0–34.0)
MCHC: 31.6 g/dL (ref 30.0–36.0)
MCV: 91.6 fL (ref 80.0–100.0)
Platelets: 297 10*3/uL (ref 150–400)
RBC: 3.7 MIL/uL — ABNORMAL LOW (ref 3.87–5.11)
RDW: 16.8 % — ABNORMAL HIGH (ref 11.5–15.5)
WBC: 7.5 10*3/uL (ref 4.0–10.5)
nRBC: 0 % (ref 0.0–0.2)

## 2020-08-18 LAB — TSH: TSH: 3.289 u[IU]/mL (ref 0.350–4.500)

## 2020-08-18 MED ORDER — APIXABAN 5 MG PO TABS: 5.0000 mg | ORAL_TABLET | Freq: Two times a day (BID) | ORAL | 11 refills | Status: DC

## 2020-08-18 MED ORDER — METOPROLOL TARTRATE 25 MG PO TABS
25.0000 mg | ORAL_TABLET | Freq: Two times a day (BID) | ORAL | 3 refills | Status: DC
Start: 2020-08-18 — End: 2021-01-05

## 2020-08-18 MED ORDER — METOPROLOL TARTRATE 25 MG PO TABS: 12.5000 mg | ORAL_TABLET | Freq: Two times a day (BID) | ORAL | 3 refills | Status: DC

## 2020-08-18 NOTE — Patient Instructions (Addendum)
Medication Instructions:  Your physician has recommended you make the following change in your medication:   Eliquis 5 mg Two Times Daily    *If you need a refill on your cardiac medications before your next appointment, please call your pharmacy*   Lab Work: Your physician recommends that you return for lab work in: Today   If you have labs (blood work) drawn today and your tests are completely normal, you will receive your results only by: Marland Kitchen MyChart Message (if you have MyChart) OR . A paper copy in the mail If you have any lab test that is abnormal or we need to change your treatment, we will call you to review the results.   Testing/Procedures: Your physician has recommended that you have a Cardioversion (DCCV). Electrical Cardioversion uses a jolt of electricity to your heart either through paddles or wired patches attached to your chest. This is a controlled, usually prescheduled, procedure. Defibrillation is done under light anesthesia in the hospital, and you usually go home the day of the procedure. This is done to get your heart back into a normal rhythm. You are not awake for the procedure. Please see the instruction sheet given to you today.     Follow-Up: At [facility], you and your health needs are our priority.  As part of our continuing mission to provide you with exceptional heart care, we have created designated Provider Care Teams.  These Care Teams include your primary Cardiologist (physician) and Advanced Practice Providers (APPs -  Physician Assistants and Nurse Practitioners) who all work together to provide you with the care you need, when you need it.  We recommend signing up for the patient portal called "MyChart".  Sign up information is provided on this After Visit Summary.  MyChart is used to connect with patients for Virtual Visits (Telemedicine).  Patients are able to view lab/test results, encounter notes, upcoming appointments, etc.  Non-urgent messages  can be sent to your provider as well.   To learn more about what you can do with MyChart, go to [email or web address].    Your next appointment:   4 week(s)  The format for your next appointment:   In Person  Provider:   You may see MD  or one of the following Advanced Practice Providers on your designated Care Team:    Bernerd Pho, PA-C   Ermalinda Barrios, PA-C     Other Instructions Thank you for choosing [facility]!

## 2020-08-22 ENCOUNTER — Telehealth: Payer: Self-pay | Admitting: Student

## 2020-08-22 NOTE — Telephone Encounter (Signed)
I spoke with patient. She states she had to leave message with triage nurse at Dr.Radium's office.I forwarded Dr.McDowell's message to dr.[place].

## 2020-08-22 NOTE — Telephone Encounter (Signed)
Appointment scheduled with PCP

## 2020-08-22 NOTE — Telephone Encounter (Signed)
Schedule OV for patient for her diarrhea, breast pain symptoms These are not typical of eliquis

## 2020-08-22 NOTE — Telephone Encounter (Signed)
Patient started Eliquis on Saturday.She has has diarrhea, pain in breasts, pain in spine, no energy, since starting medication.She is scheduled for cardioversion on 08/31/20. I suggested she touch base with Dr.Chapman regarding diarrhea.

## 2020-08-22 NOTE — Telephone Encounter (Signed)
Cardioversion cancelled since she would have only been on Eliquis for 2 weeks, will make apt with A.Quinn, NP in the [facility] office Oct 4th at 9:30 am, patient aware. Maria Fry has apt with Dr.Fabrica in the am.

## 2020-08-22 NOTE — Telephone Encounter (Signed)
Pt feels new prescription eliquis  Is giving her diarrhea    Please call [phone]   Thanks renee

## 2020-08-22 NOTE — Telephone Encounter (Signed)
I reviewed the chart, [name] is a former patient of Dr. Bronson Ing that I not have not met yet. I see that she is scheduled for a cardioversion with me on September 30.  I have a few concerns here.  If she was just started on Eliquis this past Saturday, she has not been on anticoagulation long enough to be scheduled for cardioversion on September 30.  I would prefer 3 to 4 weeks.  Also, if she is having issues with potential side effects, anticoagulation needs to be clarified before any cardioversion is attempted, since uninterrupted anticoagulation will be necessary 1 month after the procedure.  The symptoms that are described are not necessarily typical for Eliquis, would question whether this is absolutely related.  The only way to know however may be to stop the medication.  If this happens however, a different anticoagulant would need to be chosen and the cardioversion will be pushed out further.  Please have her follow-up with PCP to clarify that there is not another reason for her current symptoms.  If not, could consider switching from Eliquis to either Xarelto (if cost is not prohibitive) or Coumadin.  Would then schedule an office visit after being on the anticoagulant for a few weeks to make sure she is tolerating it prior to pursuing a cardioversion.

## 2020-08-23 ENCOUNTER — Encounter: Payer: Self-pay | Admitting: Family Medicine

## 2020-08-23 ENCOUNTER — Other Ambulatory Visit: Payer: Self-pay

## 2020-08-23 ENCOUNTER — Ambulatory Visit (INDEPENDENT_AMBULATORY_CARE_PROVIDER_SITE_OTHER): Payer: PPO | Admitting: Family Medicine

## 2020-08-23 VITALS — BP 130/86 | HR 76 | Temp 98.2°F | Resp 16 | Ht 60.0 in | Wt 219.0 lb

## 2020-08-23 DIAGNOSIS — I482 Chronic atrial fibrillation, unspecified: Secondary | ICD-10-CM | POA: Insufficient documentation

## 2020-08-23 DIAGNOSIS — I5042 Chronic combined systolic (congestive) and diastolic (congestive) heart failure: Secondary | ICD-10-CM | POA: Diagnosis not present

## 2020-08-23 DIAGNOSIS — R609 Edema, unspecified: Secondary | ICD-10-CM

## 2020-08-23 DIAGNOSIS — D649 Anemia, unspecified: Secondary | ICD-10-CM

## 2020-08-23 DIAGNOSIS — R197 Diarrhea, unspecified: Secondary | ICD-10-CM

## 2020-08-23 DIAGNOSIS — I4891 Unspecified atrial fibrillation: Secondary | ICD-10-CM | POA: Diagnosis not present

## 2020-08-23 DIAGNOSIS — E559 Vitamin D deficiency, unspecified: Secondary | ICD-10-CM

## 2020-08-23 MED ORDER — VITAMIN D 50 MCG (2000 UT) PO CAPS: ORAL_CAPSULE | ORAL | 2 refills | Status: DC

## 2020-08-23 MED ORDER — CALCIUM CARBONATE 600 MG PO TABS: 600.0000 mg | ORAL_TABLET | Freq: Two times a day (BID) | ORAL | 6 refills | Status: DC

## 2020-08-23 MED ORDER — RANOLAZINE ER 500 MG PO TB12
500.0000 mg | ORAL_TABLET | Freq: Two times a day (BID) | ORAL | 2 refills | Status: DC
Start: 2020-08-23 — End: 2020-12-06

## 2020-08-23 NOTE — Assessment & Plan Note (Signed)
Diuretic management per her cardiologist.

## 2020-08-23 NOTE — Patient Instructions (Addendum)
Start taking the calcium 600mg  twice a day Continue vitamin D 2000IU once a day Keep hydrated Keep taking eliquis for now  We will call with anemia results  Restart ranexa for chest pain- I will send a message to your heart doctor Cancel appt for NEXT WEEK F/U 3 months

## 2020-08-23 NOTE — Assessment & Plan Note (Signed)
Patient with new onset atrial fibrillation in the setting of known coronary artery disease and combined systolic and diastolic heart failure.  I believe her cardiovascular issues are contributing to her fatigue shortness of breath and weak spells. Were able to call the pharmacy to discuss her Ranexa.  She is out of the donut hole now and was able to get her Ranexa for $29 which she will pick up today.  Diarrhea has resolved and the back pain is already improved so recommend that she continue with the Eliquis so that she can be cardioverted in a few weeks.  Guards to the laboratory abnormalities.  She has had intermittent episodes of hypocalcemia.  She had normal PTH.  She does have some vitamin D deficiency.  We will recheck these levels today.  She has not been taking the calcium which we discussed a few months ago.  I have sent this to the pharmacy to help with confusion she will take 600 mg twice a day.  She will continue vitamin D 2000 international units once a day.  Thyroidism she is on levothyroxine TSH looks okay no changes today.  Does have history of iron deficiency anemia she had stopped taking her iron because it made her sleepy.  I will recheck iron stores today.  I did discuss that we could get her seen by hematology for possible iron infusion she does not want to take the oral supplement but states that she will try to oral again before going for an infusion.

## 2020-08-23 NOTE — Progress Notes (Signed)
Subjective:    Patient ID: Maria Fry, female    DOB: [birth year], 69 y.o.   MRN: [id number]  Patient presents for Illness (x days- diarrhea (resolved last night), fatigue, SOB, slight cough, back pain)   She has been having difficulty breathing  For 2 weeks, when she lays on her side she cant breathe, when she lays on her back even with  multiple pillows and still feels she cant breathe She will often sleep in the recliner  Monday she stayed up  all night because she felt like she couldn't breathe  She has increased chest pain as well.  She notes that her symptoms worsen after she came off of her Ranexa.  She had to stop it secondary to hitting the donut hole.  When she was seen by cardiology recently she was diagnosed with atrial fibrillation and was started on Eliquis.  They are planning for cardioversion when she has been on the blood thinner for 3 weeks.  She was continued on metoprolol.  She states that she has had some side effects and she started the Eliquis that were also concerning.  Diarrhea started Sunday after being on eliquis, she had multiple episodes, this resolved yesterday  Back pain started on Sunday as well, but HAS  Improved She had cramps in legs as well after starting eliquis, only feels it in right left and foot No change in swelling in her legs   She just does not feel herself.  She feels off balance when she tries to exert herself.  Gets short of breath very easily when exerting herself.  She was not sure if this is due to medication or her heart issues.  Reviewed her recent labs.  She does not have any breast pain.  She is complaining of pain in the center of her chest since she has been off of her Ranexa which is not new as she has no anginal symptoms.   She had labs done , which showed elevated Creatinine of  1.11, taking lasix , she has been trying to drink more water , she has been trying to cut out coffee, soda, juices  Anemia- now  10.7 , history of iron  def, she stopped taking iron, states it made her sleepy     Calcium level low at  8.4 , she has known vitamin D def, but normal PTH  Glucose 122   Hypothyroidism- taking levothyroxine , recent TSH at goal      Review Of Systems:  GEN- +fatigue, fever, weight loss,weakness, recent illness HEENT- denies eye drainage, change in vision, nasal discharge, CVS- + chest pain, palpitations RESP-+ SOB, cough, wheeze ABD- denies N/V, +change in stools, abd pain GU- denies dysuria, hematuria, dribbling, incontinence MSK- denies joint pain, muscle aches, injury Neuro- denies headache, dizziness, syncope, seizure activity       Objective:    BP 130/86   Pulse 76   Temp 98.2 F (36.8 C) (Temporal)   Resp 16   Ht 5' (1.524 m)   Wt 219 lb (99.3 kg)   SpO2 95%   BMI 42.77 kg/m  GEN- NAD, alert and oriented x3 HEENT- PERRL, EOMI, non injected sclera, pink conjunctiva, MMM, oropharynx clear Neck- Supple, no JVD  CVS-irregular rhythm normal rate, no murmur RESP-CTAB ABD-NABS,soft,NT,ND MSK- fair ROM of spine  EXT- 1+ peripheral edema Pulses- Radial 2+ NEURO-CNII-XII int act        Assessment & Plan:      Problem List Items Addressed  This Visit      Unprioritized   Anemia   Relevant Orders   CBC with Differential/Platelet   Iron, TIBC and Ferritin Panel   Vitamin B12   Atrial fibrillation ([place])    Patient with new onset atrial fibrillation in the setting of known coronary artery disease and combined systolic and diastolic heart failure.  I believe her cardiovascular issues are contributing to her fatigue shortness of breath and weak spells. Were able to call the pharmacy to discuss her Ranexa.  She is out of the donut hole now and was able to get her Ranexa for $29 which she will pick up today.  Diarrhea has resolved and the back pain is already improved so recommend that she continue with the Eliquis so that she can be cardioverted in a few weeks.  Guards to the  laboratory abnormalities.  She has had intermittent episodes of hypocalcemia.  She had normal PTH.  She does have some vitamin D deficiency.  We will recheck these levels today.  She has not been taking the calcium which we discussed a few months ago.  I have sent this to the pharmacy to help with confusion she will take 600 mg twice a day.  She will continue vitamin D 2000 international units once a day.  Thyroidism she is on levothyroxine TSH looks okay no changes today.  Does have history of iron deficiency anemia she had stopped taking her iron because it made her sleepy.  I will recheck iron stores today.  I did discuss that we could get her seen by hematology for possible iron infusion she does not want to take the oral supplement but states that she will try to oral again before going for an infusion.      Relevant Medications   ranolazine (RANEXA) 500 MG 12 hr tablet   Chronic combined systolic and diastolic heart failure (HCC)   Relevant Medications   ranolazine (RANEXA) 500 MG 12 hr tablet   Other Relevant Orders   COMPLETE METABOLIC PANEL WITH GFR   Peripheral edema    Diuretic management per her cardiologist.       Other Visit Diagnoses    Diarrhea, unspecified type    -  Primary   Relevant Orders   COMPLETE METABOLIC PANEL WITH GFR   Vitamin D deficiency       Relevant Orders   Vitamin D, 25-hydroxy      Note: This dictation was prepared with Dragon dictation along with smaller phrase technology. Any transcriptional errors that result from this process are unintentional.

## 2020-08-24 LAB — COMPLETE METABOLIC PANEL WITH GFR
AG Ratio: 1 (calc) (ref 1.0–2.5)
ALT: 28 U/L (ref 6–29)
AST: 25 U/L (ref 10–35)
Albumin: 3.5 g/dL — ABNORMAL LOW (ref 3.6–5.1)
Alkaline phosphatase (APISO): 100 U/L (ref 37–153)
BUN: 17 mg/dL (ref 7–25)
CO2: 25 mmol/L (ref 20–32)
Calcium: 8.3 mg/dL — ABNORMAL LOW (ref 8.6–10.4)
Chloride: 103 mmol/L (ref 98–110)
Creat: 0.97 mg/dL (ref 0.50–0.99)
GFR, Est African American: 70 mL/min/{1.73_m2} (ref >=60)
GFR, Est Non African American: 60 mL/min/{1.73_m2} (ref >=60)
Globulin: 3.4 g/dL (calc) (ref 1.9–3.7)
Glucose, Bld: 87 mg/dL (ref 65–99)
Potassium: 4 mmol/L (ref 3.5–5.3)
Sodium: 136 mmol/L (ref 135–146)
Total Bilirubin: 0.5 mg/dL (ref 0.2–1.2)
Total Protein: 6.9 g/dL (ref 6.1–8.1)

## 2020-08-24 LAB — CBC WITH DIFFERENTIAL/PLATELET
Absolute Monocytes: 545 cells/uL (ref 200–950)
Basophils Absolute: 28 cells/uL (ref 0–200)
Basophils Relative: 0.4 %
Eosinophils Absolute: 117 cells/uL (ref 15–500)
Eosinophils Relative: 1.7 %
HCT: 33.5 % — ABNORMAL LOW (ref 35.0–45.0)
Hemoglobin: 10.8 g/dL — ABNORMAL LOW (ref 11.7–15.5)
Lymphs Abs: 2408 cells/uL (ref 850–3900)
MCH: 29.1 pg (ref 27.0–33.0)
MCHC: 32.2 g/dL (ref 32.0–36.0)
MCV: 90.3 fL (ref 80.0–100.0)
MPV: 10.9 fL (ref 7.5–12.5)
Monocytes Relative: 7.9 %
Neutro Abs: 3802 cells/uL (ref 1500–7800)
Neutrophils Relative %: 55.1 %
Platelets: 285 10*3/uL (ref 140–400)
RBC: 3.71 10*6/uL — ABNORMAL LOW (ref 3.80–5.10)
RDW: 15.5 % — ABNORMAL HIGH (ref 11.0–15.0)
Total Lymphocyte: 34.9 %
WBC: 6.9 10*3/uL (ref 3.8–10.8)

## 2020-08-24 LAB — VITAMIN B12: Vitamin B-12: 403 pg/mL (ref 200–1100)

## 2020-08-24 LAB — IRON,TIBC AND FERRITIN PANEL
%SAT: 10 % (calc) — ABNORMAL LOW (ref 16–45)
Ferritin: 10 ng/mL — ABNORMAL LOW (ref 16–288)
Iron: 35 ug/dL — ABNORMAL LOW (ref 45–160)
TIBC: 357 mcg/dL (calc) (ref 250–450)

## 2020-08-24 LAB — VITAMIN D 25 HYDROXY (VIT D DEFICIENCY, FRACTURES): Vit D, 25-Hydroxy: 30 ng/mL (ref 30–100)

## 2020-08-25 ENCOUNTER — Other Ambulatory Visit: Payer: Self-pay | Admitting: *Deleted

## 2020-08-25 DIAGNOSIS — D509 Iron deficiency anemia, unspecified: Secondary | ICD-10-CM

## 2020-08-29 ENCOUNTER — Encounter (HOSPITAL_COMMUNITY): Payer: PPO

## 2020-08-29 ENCOUNTER — Other Ambulatory Visit: Payer: Self-pay

## 2020-08-29 ENCOUNTER — Other Ambulatory Visit (HOSPITAL_COMMUNITY): Payer: PPO

## 2020-08-29 ENCOUNTER — Ambulatory Visit (HOSPITAL_COMMUNITY)
Admission: RE | Admit: 2020-08-29 | Discharge: 2020-08-29 | Disposition: A | Discharge status: Home / Self Care | Payer: PPO | Source: Ambulatory Visit | Attending: Physician Assistant | Admitting: Physician Assistant

## 2020-08-29 DIAGNOSIS — I4819 Other persistent atrial fibrillation: Secondary | ICD-10-CM | POA: Insufficient documentation

## 2020-08-29 LAB — ECHOCARDIOGRAM COMPLETE
AR max vel: 2.37 cm2
AV Area VTI: 2.07 cm2
AV Area mean vel: 2.15 cm2
AV Mean grad: 2 mmHg
AV Peak grad: 2.7 mmHg
Ao pk vel: 0.83 m/s
Area-P 1/2: 5.7 cm2
Calc EF: 42.5 %
MV M vel: 5.32 m/s
MV Peak grad: 113.2 mmHg
P 1/2 time: 477 msec
Radius: 0.7 cm
S' Lateral: 4.31 cm
Single Plane A2C EF: 46 %
Single Plane A4C EF: 47.7 %

## 2020-08-29 NOTE — Progress Notes (Signed)
*  PRELIMINARY RESULTS* Echocardiogram 2D Echocardiogram has been performed.  Maria Fry 08/29/2020, 12:56 PM

## 2020-08-31 ENCOUNTER — Ambulatory Visit (HOSPITAL_COMMUNITY): Admit: 2020-08-31 | Payer: PPO | Admitting: Cardiology

## 2020-08-31 ENCOUNTER — Telehealth: Payer: Self-pay

## 2020-08-31 ENCOUNTER — Encounter (HOSPITAL_COMMUNITY): Payer: Self-pay

## 2020-08-31 SURGERY — CARDIOVERSION
Anesthesia: Monitor Anesthesia Care

## 2020-08-31 MED ORDER — FUROSEMIDE 40 MG PO TABS: ORAL_TABLET | ORAL | 6 refills | Status: DC

## 2020-08-31 NOTE — Telephone Encounter (Signed)
-----   Message from Lonn Georgia, PA-C sent at 08/30/2020 12:21 PM EDT ----- Please let her know that her heart muscle function has decreased a little, back to where it was 11/2019 Pressures inside her heart are too high. Please take Lasix 40 mg bid 4 days a week and 40 mg qd 3 days a week.  Continue to watch the salt and liquids and check dailyt weith WEIGH DAILY, every am, wearing the same amount of clothing Record weights, contact the office for weight gain of 3 lbs in a day or 5 lbs in a week Limit sodium to 500 mg per meal, total 2000 mg per day Limit all liquids to 1.5-2 liters/quarts per day Keep f/u on 10/4 in [place] Thanks

## 2020-08-31 NOTE — Telephone Encounter (Signed)
I spoke with patient.She will make changes with Lasix as directed and call us for weight gain.I asked her to limit fluids to 1.5-2 liters per day. She agrees to limit sodium and keep f/u apt in [place] on 10/4

## 2020-09-01 ENCOUNTER — Telehealth: Payer: Self-pay | Admitting: Internal Medicine

## 2020-09-01 ENCOUNTER — Ambulatory Visit: Payer: PPO | Admitting: Family Medicine

## 2020-09-01 NOTE — Telephone Encounter (Signed)
Called patient to remind of appt with Levell July NP on Mon 09/04/20 at 9:30am.  Left msg at home number.

## 2020-09-03 NOTE — Progress Notes (Signed)
Cardiology Office Note  Date: 09/04/2020   ID: Maria Fry, DOB [birth year], MRN [id number]  PCP:  Alycia Rossetti, MD  Cardiologist:  No primary care provider on file. Electrophysiologist:  None   Chief Complaint: Follow-up CAD, CHF, HTN, HLD New AFIB  History of Present Illness: Maria Fry is a 69 y.o. female with a history of CAD, chronic combined systolic and diastolic heart failure, HTN, HLD, hypothyroidism. (New diagnosis of atrial fibrillation at 08/18/2020 visit).  Last seen by Rosaria Ferries, PA for 2-month follow-up.  She complained of problems with daytime lower extremity edema.  Her current Lasix schedule was keeping most of her fluid off.  Her weight has been stable between 215 and 220 pounds.  She stated the fluid accumulated when she sits and did not put her feet up.  Was having some problems with fatigue becoming extremely tired with just taking out the trash.  Brief chest pains at different areas resolving spontaneously.  Tended to be sharp. She c/o of waking in the night and feeling SOB, and falling  asleep during the day easily. Stated she was never told she had an irregular heart rhythm. Repeat echocardiogram was ordered. Sleep study was recommended. She was started on Eliquis 5 mg po bid. A TEE cardioversion was suggested but patient wanted to wait until she had been anticoagulated x 3 and undergo DCCV. She was continuing her metoprolol. She was complaint with her synthroid. Not having CAD symptoms. She was continuing ASA, statin and BB. TSH, CBC, and BmET were ordered.  Patient called complaining of breast pain and diarrhea and thought symptoms were due to starting eliquis. Cardioversion was cancelled pending follow up. Suggestion was to possibly switch to Xarelto or Coumadin.  Patient presents today for follow-up.  She states the symptoms she was having which she attributed to possible reaction to Eliquis have subsided now.  She states the breast pain and diarrhea were  short-lived.  States she has been taking her Eliquis religiously since 08/19/2020 twice a day.  Still complaining of some dyspnea on exertion.  Heart rate today is 74 and irregularly irregular.  Recent echocardiogram: 08/29/2020 showed EF of 40 to 45%, LV demonstrates global hypokinesis, severe LVH, moderately elevated pulmonary artery systolic pressure, severe LA dilation, severe RA dilation.  Mild to moderate mitral valve regurgitation.  Moderate TR.  PASP 50 mmHg.  Blood pressure today 112/84 with a heart rate of 74 and irregularly irregular.  EKG today shows atrial fibrillation with a rate of 73, ST and T wave abnormality, consider inferior ischemia or digitalis effect, ST and T wave abnormality consider anterior lateral ischemia or digitalis effect.  Denies any orthostatic symptoms, PND, orthopnea.  CVA or TIA-like symptoms, bleeding issues, claudication-like symptoms, DVT or PE-like symptoms.  States she does have some mild lower extremity edema.  States this usually resolves after lying flat during the night.  Occurs mostly when sitting during the day.  Past Medical History:  Diagnosis Date  . CAD (coronary artery disease)    a. 06/07/15 NSTEMI/Cath: Dist LAD 100%, mid LAD 10%. Otw nl cors. Nl EF w/ inferoapical AK.  Marland Kitchen Chronic combined systolic (congestive) and diastolic (congestive) heart failure ([place]) 04/23/2016   a. 04/2016 Echo: EF 45%, Gr2 DD; b. 11/2019 Echo: EF 45-50%, mod LVH. Gr2 DD. Nl RV fxn. Sev dil LA.   . Essential hypertension   . Hip pain   . Hyperlipidemia   . Hypothyroidism   . MI (myocardial infarction) ([place])  06/05/2015  . Vitamin D deficiency     Past Surgical History:  Procedure Laterality Date  . ABDOMINAL HYSTERECTOMY    . CARDIAC CATHETERIZATION N/A 06/06/2015   Procedure: Left Heart Cath and Coronary Angiography;  Surgeon: Peter M Martinique, MD;  Location: [facility];  Service: Cardiovascular;  Laterality: N/A;  . CHOLECYSTECTOMY    . COLONOSCOPY    . COLONOSCOPY  N/A 08/13/2017   Procedure: COLONOSCOPY;  Surgeon: Daneil Dolin, MD;  Location: [facility];  Service: Endoscopy;  Laterality: N/A;  8:30 AM  . ESOPHAGOGASTRODUODENOSCOPY N/A 09/11/2016   Procedure: ESOPHAGOGASTRODUODENOSCOPY (EGD);  Surgeon: Daneil Dolin, MD;  Location: [facility];  Service: Endoscopy;  Laterality: N/A;  7:45 am - moved to 10/11 @ 10:30  . Heel spurs    . MALONEY DILATION N/A 09/11/2016   Procedure: Venia Minks DILATION;  Surgeon: Daneil Dolin, MD;  Location: [facility];  Service: Endoscopy;  Laterality: N/A;  . POLYPECTOMY  08/13/2017   Procedure: POLYPECTOMY;  Surgeon: Daneil Dolin, MD;  Location: [facility];  Service: Endoscopy;;  colon  . SHOULDER ARTHROSCOPY    . TUBAL LIGATION      Current Outpatient Medications  Medication Sig Dispense Refill  . acetaminophen (TYLENOL) 500 MG tablet Take 500 mg by mouth every 6 (six) hours as needed for mild pain or moderate pain. Reported on 04/22/2016    . apixaban (ELIQUIS) 5 MG TABS tablet Take 1 tablet (5 mg total) by mouth 2 (two) times daily. 60 tablet 11  . atorvastatin (LIPITOR) 80 MG tablet Take 1 tablet (80 mg total) by mouth at bedtime. 90 tablet 3  . calcium carbonate (CALCIUM 600) 600 MG TABS tablet Take 1 tablet (600 mg total) by mouth 2 (two) times daily with a meal. 60 tablet 6  . Cholecalciferol (VITAMIN D) 50 MCG (2000 UT) CAPS Take 1 tablet daily 30 capsule 2  . ferrous sulfate 325 (65 FE) MG EC tablet Take 325 mg by mouth daily.    . furosemide (LASIX) 40 MG tablet Take 40 mg twice a day 4 days/week (Tuesday, Thursday, Saturday, Sunday) and take 40 mg  daily 3 days/week (Monday, Wednesday and Friday) 120 tablet 6  . gabapentin (NEURONTIN) 300 MG capsule Take 300 mg by mouth 2 (two) times daily.     Marland Kitchen KLOR-CON M15 15 MEQ tablet Take 1 tablet by mouth twice daily 180 tablet 3  . levothyroxine (SYNTHROID) 100 MCG tablet TAKE 1 TABLET BY MOUTH ONCE DAILY BEFORE BREAKFAST 90 tablet 2  . loratadine  (CLARITIN) 10 MG tablet Take 10 mg by mouth daily as needed for allergies.    . metoprolol tartrate (LOPRESSOR) 25 MG tablet Take 1 tablet (25 mg total) by mouth 2 (two) times daily. 180 tablet 3  . nitroGLYCERIN (NITROSTAT) 0.4 MG SL tablet DISSOLVE ONE TABLET UNDER THE TONGUE EVERY 5 MINUTES AS NEEDED FOR CHEST PAIN.  DO NOT EXCEED A TOTAL OF 3 DOSES IN 15 MINUTES 25 tablet 3  . ondansetron (ZOFRAN ODT) 4 MG disintegrating tablet 4mg  ODT q4 hours prn nausea/vomit 12 tablet 0  . potassium chloride (KLOR-CON) 10 MEQ tablet Take 1 tablet (10 meq) 4 days/week (Tuesday, Thursday, Saturday, Sunday) and take 2 tablets (20 meq) 3 days/week (Monday, Wednesday, Friday) 40 tablet 0  . ranolazine (RANEXA) 500 MG 12 hr tablet Take 1 tablet (500 mg total) by mouth 2 (two) times daily. 60 tablet 2   No current facility-administered medications for  this visit.   Allergies:  Vicodin [hydrocodone-acetaminophen], Amoxicillin, Demerol [meperidine], Lisinopril, Ranolazine, and Lodine [etodolac]   Social History: The patient  reports that she quit smoking about 17 years ago. Her smoking use included cigarettes. She started smoking about 42 years ago. She has never used smokeless tobacco. She reports that she does not drink alcohol and does not use drugs.   Family History: The patient's family history includes Cancer in her brother; Heart attack in her brother and father; Heart failure in her brother and father; Stroke in her sister.   ROS:  Please see the history of present illness. Otherwise, complete review of systems is positive for none.  All other systems are reviewed and negative.   Physical Exam: VS:  BP 112/84   Pulse 74   Ht 5' (1.524 m)   Wt 219 lb (99.3 kg)   SpO2 97%   BMI 42.77 kg/m , BMI Body mass index is 42.77 kg/m.  Wt Readings from Last 3 Encounters:  09/04/20 219 lb (99.3 kg)  08/23/20 219 lb (99.3 kg)  08/18/20 220 lb (99.8 kg)    General: Patient appears comfortable at rest. Neck:  Supple, no elevated JVP or carotid bruits, no thyromegaly. Lungs: Clear to auscultation, nonlabored breathing at rest. Cardiac: Irregularly irregular rate and rhythm, no S3 or significant systolic murmur, no pericardial rub. Extremities: No pitting edema, distal pulses 2+. Skin: Warm and dry. Musculoskeletal: No kyphosis. Neuropsychiatric: Alert and oriented x3, affect grossly appropriate.  ECG:  An ECG dated 09/04/2020 was personally reviewed today and demonstrated:  Atrial fibrillation with a rate of 73, ST and T wave abnormality, consider inferior ischemia, ST and T wave abnormality consider anterolateral ischemia.  Recent Labwork: 01/14/2020: B Natriuretic Peptide 556.0; Magnesium 1.9 08/18/2020: TSH 3.289 08/23/2020: ALT 28; AST 25; BUN 17; Creat 0.97; Hemoglobin 10.8; Platelets 285; Potassium 4.0; Sodium 136     Component Value Date/Time   CHOL 123 01/03/2020 0948   TRIG 68 01/03/2020 0948   HDL 46 (L) 01/03/2020 0948   CHOLHDL 2.7 01/03/2020 0948   VLDL 12 06/23/2017 0902   LDLCALC 63 01/03/2020 0948    Other Studies Reviewed Today:  Echocardiogram 08/29/2020 1. Left ventricular ejection fraction, by estimation, is 40 to 45%. The left ventricle has mildly decreased function. The left ventricle demonstrates global hypokinesis. There is severe left ventricular hypertrophy. Left ventricular diastolic parameters are indeterminate. 2. Right ventricular systolic function is normal. The right ventricular size is normal. There is moderately elevated pulmonary artery systolic pressure. 3. Left atrial size was severely dilated. 4. Right atrial size was severely dilated. 5. The mitral valve is normal in structure. Mild to moderate mitral valve regurgitation. No evidence of mitral stenosis. 6. Tricuspid valve regurgitation is moderate. 7. The aortic valve is tricuspid. Aortic valve regurgitation is mild to moderate. 8. Moderate pulmonary HTN, PASP is 50 mmHg. 9. The inferior vena cava is  dilated in size with <50% respiratory variability, suggesting right atrial pressure of 15 mmHg.   ECHO: 01/14/2020 1. Left ventricular ejection fraction, by estimation, is 60 to 65%. The  left ventricle has normal function. The left ventricle has no regional  wall motion abnormalities. There is severely increased left ventricular  hypertrophy. Left ventricular  diastolic parameters are consistent with Grade I diastolic dysfunction  (impaired relaxation).  2. Right ventricular systolic function is normal. The right ventricular  size is normal. There is mildly elevated pulmonary artery systolic  pressure.  3. Left atrial size was severely dilated.  4. Right atrial size was severely dilated.  5. The mitral valve is normal in structure and function. Mild mitral  valve regurgitation. No evidence of mitral stenosis.  6. The aortic valve is tricuspid. Aortic valve regurgitation is not  visualized. No aortic stenosis is present.  7. The inferior vena cava is normal in size with <50% respiratory  variability, suggesting right atrial pressure of 8 mmHg.   CATH: 06/06/2015  Dist LAD lesion, 100% stenosed.  Mid LAD lesion, 10% stenosed.  The left ventricular systolic function is normal.  1. 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 Good LV function  Plan: DAPT, beta blocker and risk factor modification.   Assessment and Plan:  1. Chronic combined systolic (congestive) and diastolic (congestive) heart failure ([place])   2. Atrial fibrillation, unspecified type ([place])   3. Suspected sleep apnea   4. CAD in native artery   5. Hypothyroidism, unspecified type    1. Chronic combined systolic (congestive) and diastolic (congestive) heart failure ([facility]) Recent echocardiogram on 08/09/2020 showed EF 40 to 45%, LV global hypokinesis, severe LVH, moderately elevated pulmonary artery systolic pressure with a PASP of 50 mmHg.  Severe LA dilation, severe RA dilation, mild to  moderate MR, moderate TR.  AR mild to moderate.  Continue furosemide 40 mg twice a day Tuesday, Thursday, Saturday.  Take 40 mg 3 days/week on Mondays, Wednesdays and Fridays.  Continue metoprolol 25 mg p.o. twice daily.  2. Atrial fibrillation, unspecified type [facility]) We will proceed with cardioversion.  She has been anticoagulated since 08/19/2020.  States she has been religious about taking the medication and has not missed any doses.  Continue Eliquis 5 mg p.o. twice daily.  Continue metoprolol 25 mg p.o. twice daily.  3. Suspected sleep apnea Please refer to pulmonology for sleep apnea suspicion.    4. CAD in native artery He denies any anginal or exertional symptoms.  Continue nitroglycerin sublingual as needed.  Continue metoprolol 25 mg p.o. twice daily.  Continue atorvastatin 80 mg p.o. twice daily.  5. Hypothyroidism, unspecified type Continue Synthroid 100 mcg p.o. twice daily  Medication Adjustments/Labs and Tests Ordered: Current medicines are reviewed at length with the patient today.  Concerns regarding medicines are outlined above.   Disposition: Follow-up with Dr. Domenic Polite or APP in 1 month after DC cardioversion.  Signed, Levell July, NP 09/04/2020 9:57 AM    [facility] [place], [place], [place] [address] Phone: [phone]; Fax: [phone]

## 2020-09-03 NOTE — H&P (View-Only) (Signed)
Cardiology Office Note  Date: 09/04/2020   ID: Maria Fry, DOB [birth year], MRN [id number]  PCP:  Alycia Rossetti, MD  Cardiologist:  No primary care provider on file. Electrophysiologist:  None   Chief Complaint: Follow-up CAD, CHF, HTN, HLD New AFIB  History of Present Illness: Maria Fry is a 68 y.o. female with a history of CAD, chronic combined systolic and diastolic heart failure, HTN, HLD, hypothyroidism. (New diagnosis of atrial fibrillation at 08/18/2020 visit).  Last seen by Rosaria Ferries, PA for 32-month follow-up.  She complained of problems with daytime lower extremity edema.  Her current Lasix schedule was keeping most of her fluid off.  Her weight has been stable between 215 and 220 pounds.  She stated the fluid accumulated when she sits and did not put her feet up.  Was having some problems with fatigue becoming extremely tired with just taking out the trash.  Brief chest pains at different areas resolving spontaneously.  Tended to be sharp. She c/o of waking in the night and feeling SOB, and falling  asleep during the day easily. Stated she was never told she had an irregular heart rhythm. Repeat echocardiogram was ordered. Sleep study was recommended. She was started on Eliquis 5 mg po bid. A TEE cardioversion was suggested but patient wanted to wait until she had been anticoagulated x 3 and undergo DCCV. She was continuing her metoprolol. She was complaint with her synthroid. Not having CAD symptoms. She was continuing ASA, statin and BB. TSH, CBC, and BmET were ordered.  Patient called complaining of breast pain and diarrhea and thought symptoms were due to starting eliquis. Cardioversion was cancelled pending follow up. Suggestion was to possibly switch to Xarelto or Coumadin.  Patient presents today for follow-up.  She states the symptoms she was having which she attributed to possible reaction to Eliquis have subsided now.  She states the breast pain and diarrhea were  short-lived.  States she has been taking her Eliquis religiously since 08/19/2020 twice a day.  Still complaining of some dyspnea on exertion.  Heart rate today is 74 and irregularly irregular.  Recent echocardiogram: 08/29/2020 showed EF of 40 to 45%, LV demonstrates global hypokinesis, severe LVH, moderately elevated pulmonary artery systolic pressure, severe LA dilation, severe RA dilation.  Mild to moderate mitral valve regurgitation.  Moderate TR.  PASP 50 mmHg.  Blood pressure today 112/84 with a heart rate of 74 and irregularly irregular.  EKG today shows atrial fibrillation with a rate of 73, ST and T wave abnormality, consider inferior ischemia or digitalis effect, ST and T wave abnormality consider anterior lateral ischemia or digitalis effect.  Denies any orthostatic symptoms, PND, orthopnea.  CVA or TIA-like symptoms, bleeding issues, claudication-like symptoms, DVT or PE-like symptoms.  States she does have some mild lower extremity edema.  States this usually resolves after lying flat during the night.  Occurs mostly when sitting during the day.  Past Medical History:  Diagnosis Date   CAD (coronary artery disease)    a. 06/07/15 NSTEMI/Cath: Dist LAD 100%, mid LAD 10%. Otw nl cors. Nl EF w/ inferoapical AK.   Chronic combined systolic (congestive) and diastolic (congestive) heart failure ([place]) 04/23/2016   a. 04/2016 Echo: EF 45%, Gr2 DD; b. 11/2019 Echo: EF 45-50%, mod LVH. Gr2 DD. Nl RV fxn. Sev dil LA.    Essential hypertension    Hip pain    Hyperlipidemia    Hypothyroidism    MI (myocardial infarction) ([place])  06/05/2015   Vitamin D deficiency     Past Surgical History:  Procedure Laterality Date   ABDOMINAL HYSTERECTOMY     CARDIAC CATHETERIZATION N/A 06/06/2015   Procedure: Left Heart Cath and Coronary Angiography;  Surgeon: Peter M Martinique, MD;  Location: [facility];  Service: Cardiovascular;  Laterality: N/A;   CHOLECYSTECTOMY     COLONOSCOPY     COLONOSCOPY  N/A 08/13/2017   Procedure: COLONOSCOPY;  Surgeon: Daneil Dolin, MD;  [facility];  Service: Endoscopy;  Laterality: N/A;  8:30 AM   ESOPHAGOGASTRODUODENOSCOPY N/A 09/11/2016   Procedure: ESOPHAGOGASTRODUODENOSCOPY (EGD);  Surgeon: Daneil Dolin, MD;  [facility];  Service: Endoscopy;  Laterality: N/A;  7:45 am - moved to 10/11 @ 10:30   Heel spurs     MALONEY DILATION N/A 09/11/2016   Procedure: Venia Minks DILATION;  Surgeon: Daneil Dolin, MD;  [facility]: [facility];  Service: Endoscopy;  Laterality: N/A;   POLYPECTOMY  08/13/2017   Procedure: POLYPECTOMY;  Surgeon: Daneil Dolin, MD;  [facility];  Service: Endoscopy;;  colon   SHOULDER ARTHROSCOPY     TUBAL LIGATION      Current Outpatient Medications  Medication Sig Dispense Refill   acetaminophen (TYLENOL) 500 MG tablet Take 500 mg by mouth every 6 (six) hours as needed for mild pain or moderate pain. Reported on 04/22/2016     apixaban (ELIQUIS) 5 MG TABS tablet Take 1 tablet (5 mg total) by mouth 2 (two) times daily. 60 tablet 11   atorvastatin (LIPITOR) 80 MG tablet Take 1 tablet (80 mg total) by mouth at bedtime. 90 tablet 3   calcium carbonate (CALCIUM 600) 600 MG TABS tablet Take 1 tablet (600 mg total) by mouth 2 (two) times daily with a meal. 60 tablet 6   Cholecalciferol (VITAMIN D) 50 MCG (2000 UT) CAPS Take 1 tablet daily 30 capsule 2   ferrous sulfate 325 (65 FE) MG EC tablet Take 325 mg by mouth daily.     furosemide (LASIX) 40 MG tablet Take 40 mg twice a day 4 days/week (Tuesday, Thursday, Saturday, Sunday) and take 40 mg  daily 3 days/week (Monday, Wednesday and Friday) 120 tablet 6   gabapentin (NEURONTIN) 300 MG capsule Take 300 mg by mouth 2 (two) times daily.      KLOR-CON M15 15 MEQ tablet Take 1 tablet by mouth twice daily 180 tablet 3   levothyroxine (SYNTHROID) 100 MCG tablet TAKE 1 TABLET BY MOUTH ONCE DAILY BEFORE BREAKFAST 90 tablet 2   loratadine  (CLARITIN) 10 MG tablet Take 10 mg by mouth daily as needed for allergies.     metoprolol tartrate (LOPRESSOR) 25 MG tablet Take 1 tablet (25 mg total) by mouth 2 (two) times daily. 180 tablet 3   nitroGLYCERIN (NITROSTAT) 0.4 MG SL tablet DISSOLVE ONE TABLET UNDER THE TONGUE EVERY 5 MINUTES AS NEEDED FOR CHEST PAIN.  DO NOT EXCEED A TOTAL OF 3 DOSES IN 15 MINUTES 25 tablet 3   ondansetron (ZOFRAN ODT) 4 MG disintegrating tablet 4mg  ODT q4 hours prn nausea/vomit 12 tablet 0   potassium chloride (KLOR-CON) 10 MEQ tablet Take 1 tablet (10 meq) 4 days/week (Tuesday, Thursday, Saturday, Sunday) and take 2 tablets (20 meq) 3 days/week (Monday, Wednesday, Friday) 40 tablet 0   ranolazine (RANEXA) 500 MG 12 hr tablet Take 1 tablet (500 mg total) by mouth 2 (two) times daily. 60 tablet 2   No current facility-administered medications for  this visit.   Allergies:  Vicodin [hydrocodone-acetaminophen], Amoxicillin, Demerol [meperidine], Lisinopril, Ranolazine, and Lodine [etodolac]   Social History: The patient  reports that she quit smoking about 17 years ago. Her smoking use included cigarettes. She started smoking about 42 years ago. She has never used smokeless tobacco. She reports that she does not drink alcohol and does not use drugs.   Family History: The patient's family history includes Cancer in her brother; Heart attack in her brother and father; Heart failure in her brother and father; Stroke in her sister.   ROS:  Please see the history of present illness. Otherwise, complete review of systems is positive for none.  All other systems are reviewed and negative.   Physical Exam: VS:  BP 112/84    Pulse 74    Ht 5' (1.524 m)    Wt 219 lb (99.3 kg)    SpO2 97%    BMI 42.77 kg/m , BMI Body mass index is 42.77 kg/m.  Wt Readings from Last 3 Encounters:  09/04/20 219 lb (99.3 kg)  08/23/20 219 lb (99.3 kg)  08/18/20 220 lb (99.8 kg)    General: Patient appears comfortable at rest. Neck:  Supple, no elevated JVP or carotid bruits, no thyromegaly. Lungs: Clear to auscultation, nonlabored breathing at rest. Cardiac: Irregularly irregular rate and rhythm, no S3 or significant systolic murmur, no pericardial rub. Extremities: No pitting edema, distal pulses 2+. Skin: Warm and dry. Musculoskeletal: No kyphosis. Neuropsychiatric: Alert and oriented x3, affect grossly appropriate.  ECG:  An ECG dated 09/04/2020 was personally reviewed today and demonstrated:  Atrial fibrillation with a rate of 73, ST and T wave abnormality, consider inferior ischemia, ST and T wave abnormality consider anterolateral ischemia.  Recent Labwork: 01/14/2020: B Natriuretic Peptide 556.0; Magnesium 1.9 08/18/2020: TSH 3.289 08/23/2020: ALT 28; AST 25; BUN 17; Creat 0.97; Hemoglobin 10.8; Platelets 285; Potassium 4.0; Sodium 136     Component Value Date/Time   CHOL 123 01/03/2020 0948   TRIG 68 01/03/2020 0948   HDL 46 (L) 01/03/2020 0948   CHOLHDL 2.7 01/03/2020 0948   VLDL 12 06/23/2017 0902   LDLCALC 63 01/03/2020 0948    Other Studies Reviewed Today:  Echocardiogram 08/29/2020 1. Left ventricular ejection fraction, by estimation, is 40 to 45%. The left ventricle has mildly decreased function. The left ventricle demonstrates global hypokinesis. There is severe left ventricular hypertrophy. Left ventricular diastolic parameters are indeterminate. 2. Right ventricular systolic function is normal. The right ventricular size is normal. There is moderately elevated pulmonary artery systolic pressure. 3. Left atrial size was severely dilated. 4. Right atrial size was severely dilated. 5. The mitral valve is normal in structure. Mild to moderate mitral valve regurgitation. No evidence of mitral stenosis. 6. Tricuspid valve regurgitation is moderate. 7. The aortic valve is tricuspid. Aortic valve regurgitation is mild to moderate. 8. Moderate pulmonary HTN, PASP is 50 mmHg. 9. The inferior vena cava is  dilated in size with <50% respiratory variability, suggesting right atrial pressure of 15 mmHg.   ECHO: 01/14/2020 1. Left ventricular ejection fraction, by estimation, is 60 to 65%. The  left ventricle has normal function. The left ventricle has no regional  wall motion abnormalities. There is severely increased left ventricular  hypertrophy. Left ventricular  diastolic parameters are consistent with Grade I diastolic dysfunction  (impaired relaxation).  2. Right ventricular systolic function is normal. The right ventricular  size is normal. There is mildly elevated pulmonary artery systolic  pressure.  3. Left  atrial size was severely dilated.  4. Right atrial size was severely dilated.  5. The mitral valve is normal in structure and function. Mild mitral  valve regurgitation. No evidence of mitral stenosis.  6. The aortic valve is tricuspid. Aortic valve regurgitation is not  visualized. No aortic stenosis is present.  7. The inferior vena cava is normal in size with <50% respiratory  variability, suggesting right atrial pressure of 8 mmHg.   CATH: 06/06/2015  Dist LAD lesion, 100% stenosed.  Mid LAD lesion, 10% stenosed.  The left ventricular systolic function is normal.  1. 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 Good LV function  Plan: DAPT, beta blocker and risk factor modification.   Assessment and Plan:  1. Chronic combined systolic (congestive) and diastolic (congestive) heart failure ([place])   2. Atrial fibrillation, unspecified type ([place])   3. Suspected sleep apnea   4. CAD in native artery   5. Hypothyroidism, unspecified type    1. Chronic combined systolic (congestive) and diastolic (congestive) heart failure ([facility]) Recent echocardiogram on 08/09/2020 showed EF 40 to 45%, LV global hypokinesis, severe LVH, moderately elevated pulmonary artery systolic pressure with a PASP of 50 mmHg.  Severe LA dilation, severe RA dilation, mild to  moderate MR, moderate TR.  AR mild to moderate.  Continue furosemide 40 mg twice a day Tuesday, Thursday, Saturday.  Take 40 mg 3 days/week on Mondays, Wednesdays and Fridays.  Continue metoprolol 25 mg p.o. twice daily.  2. Atrial fibrillation, unspecified type [facility]) We will proceed with cardioversion.  She has been anticoagulated since 08/19/2020.  States she has been religious about taking the medication and has not missed any doses.  Continue Eliquis 5 mg p.o. twice daily.  Continue metoprolol 25 mg p.o. twice daily.  3. Suspected sleep apnea Please refer to pulmonology for sleep apnea suspicion.    4. CAD in native artery He denies any anginal or exertional symptoms.  Continue nitroglycerin sublingual as needed.  Continue metoprolol 25 mg p.o. twice daily.  Continue atorvastatin 80 mg p.o. twice daily.  5. Hypothyroidism, unspecified type Continue Synthroid 100 mcg p.o. twice daily  Medication Adjustments/Labs and Tests Ordered: Current medicines are reviewed at length with the patient today.  Concerns regarding medicines are outlined above.   Disposition: Follow-up with Dr. Domenic Polite or APP in 1 month after DC cardioversion.  Signed, Levell July, NP 09/04/2020 9:57 AM    [facility] [place], [place], [place] [address] Phone: [phone]; Fax: [phone]

## 2020-09-04 ENCOUNTER — Ambulatory Visit (INDEPENDENT_AMBULATORY_CARE_PROVIDER_SITE_OTHER): Payer: PPO | Admitting: Family Medicine

## 2020-09-04 ENCOUNTER — Encounter: Payer: Self-pay | Admitting: Family Medicine

## 2020-09-04 ENCOUNTER — Telehealth: Payer: Self-pay | Admitting: Family Medicine

## 2020-09-04 ENCOUNTER — Encounter: Payer: Self-pay | Admitting: *Deleted

## 2020-09-04 VITALS — BP 112/84 | HR 74 | Ht 60.0 in | Wt 219.0 lb

## 2020-09-04 DIAGNOSIS — R29818 Other symptoms and signs involving the nervous system: Secondary | ICD-10-CM

## 2020-09-04 DIAGNOSIS — I4891 Unspecified atrial fibrillation: Secondary | ICD-10-CM | POA: Diagnosis not present

## 2020-09-04 DIAGNOSIS — I2510 Atherosclerotic heart disease of native coronary artery without angina pectoris: Secondary | ICD-10-CM

## 2020-09-04 DIAGNOSIS — E039 Hypothyroidism, unspecified: Secondary | ICD-10-CM

## 2020-09-04 DIAGNOSIS — I5042 Chronic combined systolic (congestive) and diastolic (congestive) heart failure: Secondary | ICD-10-CM | POA: Diagnosis not present

## 2020-09-04 NOTE — Patient Instructions (Signed)
Your physician recommends that you schedule a follow-up appointment in: [place], NP  Your physician recommends that you continue on your current medications as directed. Please refer to the Current Medication list given to you today.  You have been referred to [place] physician has requested that you have a Cardioversion. Electrical Cardioversion uses a jolt of electricity to your heart either through paddles or wired patches attached to your chest. This is a controlled, usually prescheduled, procedure. This procedure is done at the hospital and you are not awake during the procedure. You usually go home the day of the procedure. Please see the instruction sheet given to you today for more information.  Thank you for choosing [facility]!!

## 2020-09-04 NOTE — Telephone Encounter (Signed)
Pre-cert Verification for the following procedure     CARDIOVERSION 10/14 @ 10:45AM

## 2020-09-08 NOTE — Patient Instructions (Signed)
Maria Fry  09/08/2020     @PREFPERIOPPHARMACY @   Your procedure is scheduled on 09/14/2020.  Report to [facility] at  [employer] [facility].  Call this number if you have problems the morning of surgery:  [phone]   Remember:  Do not eat or drink after midnight.                         Take these medicines the morning of surgery with A SIP OF WATER  Gabapentin, levothyroxine, zofran(if needed), ranexa. DO NOT miss any doses of your eliquis.    Do not wear jewelry, make-up or nail polish.  Do not wear lotions, powders, or perfumes. Please wear deodorant and brush your teeth.  Do not shave 48 hours prior to surgery.  Men may shave face and neck.  Do not bring valuables to the hospital.  [facility] is not responsible for any belongings or valuables.  Contacts, dentures or bridgework may not be worn into surgery.  Leave your suitcase in the car.  After surgery it may be brought to your room.  For patients admitted to the hospital, discharge time will be determined by your treatment team.  Patients discharged the day of surgery will not be allowed to drive home.   Name and phone number of your driver:   family Special instructions:   DO NOT smoke the morning of your procedure.  Please read over the following fact sheets that you were given. Anesthesia Post-op Instructions and Care and Recovery After Surgery       Electrical Cardioversion Electrical cardioversion is the delivery of a jolt of electricity to restore a normal rhythm to the heart. A rhythm that is too fast or is not regular keeps the heart from pumping well. In this procedure, sticky patches or metal paddles are placed on the chest to deliver electricity to the heart from a device. This procedure may be done in an emergency if:  There is low or no blood pressure as a result of the heart rhythm.  Normal rhythm must be restored as fast as possible to protect the brain and heart from further damage.  It may  save a life. This may also be a scheduled procedure for irregular or fast heart rhythms that are not immediately life-threatening. Tell a health care provider about:  Any allergies you have.  All medicines you are taking, including vitamins, herbs, eye drops, creams, and over-the-counter medicines.  Any problems you or family members have had with anesthetic medicines.  Any blood disorders you have.  Any surgeries you have had.  Any medical conditions you have.  Whether you are pregnant or may be pregnant. What are the risks? Generally, this is a safe procedure. However, problems may occur, including:  Allergic reactions to medicines.  A blood clot that breaks free and travels to other parts of your body.  The possible return of an abnormal heart rhythm within hours or days after the procedure.  Your heart stopping (cardiac arrest). This is rare. What happens before the procedure? Medicines  Your health care provider may have you start taking: ? Blood-thinning medicines (anticoagulants) so your blood does not clot as easily. ? Medicines to help stabilize your heart rate and rhythm.  Ask your health care provider about: ? Changing or stopping your regular medicines. This is especially important if you are taking diabetes medicines or blood thinners. ? Taking medicines such as  aspirin and ibuprofen. These medicines can thin your blood. Do not take these medicines unless your health care provider tells you to take them. ? Taking over-the-counter medicines, vitamins, herbs, and supplements. General instructions  Follow instructions from your health care provider about eating or drinking restrictions.  Plan to have someone take you home from the hospital or clinic.  If you will be going home right after the procedure, plan to have someone with you for 24 hours.  Ask your health care provider what steps will be taken to help prevent infection. These may include washing your  skin with a germ-killing soap. What happens during the procedure?   An IV will be inserted into one of your veins.  Sticky patches (electrodes) or metal paddles may be placed on your chest.  You will be given a medicine to help you relax (sedative).  An electrical shock will be delivered. The procedure may vary among health care providers and hospitals. What can I expect after the procedure?  Your blood pressure, heart rate, breathing rate, and blood oxygen level will be monitored until you leave the hospital or clinic.  Your heart rhythm will be watched to make sure it does not change.  You may have some redness on the skin where the shocks were given. Follow these instructions at home:  Do not drive for 24 hours if you were given a sedative during your procedure.  Take over-the-counter and prescription medicines only as told by your health care provider.  Ask your health care provider how to check your pulse. Check it often.  Rest for 48 hours after the procedure or as told by your health care provider.  Avoid or limit your caffeine use as told by your health care provider.  Keep all follow-up visits as told by your health care provider. This is important. Contact a health care provider if:  You feel like your heart is beating too quickly or your pulse is not regular.  You have a serious muscle cramp that does not go away. Get help right away if:  You have discomfort in your chest.  You are dizzy or you feel faint.  You have trouble breathing or you are short of breath.  Your speech is slurred.  You have trouble moving an arm or leg on one side of your body.  Your fingers or toes turn cold or blue. Summary  Electrical cardioversion is the delivery of a jolt of electricity to restore a normal rhythm to the heart.  This procedure may be done right away in an emergency or may be a scheduled procedure if the condition is not an emergency.  Generally, this is a  safe procedure.  After the procedure, check your pulse often as told by your health care provider. This information is not intended to replace advice given to you by your health care provider. Make sure you discuss any questions you have with your health care provider. Document Revised: 06/21/2019 Document Reviewed: 06/21/2019 Elsevier Patient Education  [place] After These instructions provide you with information about caring for yourself after your procedure. Your health care provider may also give you more specific instructions. Your treatment has been planned according to current medical practices, but problems sometimes occur. Call your health care provider if you have any problems or questions after your procedure. What can I expect after the procedure? After your procedure, you may:  Feel sleepy for several hours.  Feel clumsy and have  poor balance for several hours.  Feel forgetful about what happened after the procedure.  Have poor judgment for several hours.  Feel nauseous or vomit.  Have a sore throat if you had a breathing tube during the procedure. Follow these instructions at home: For at least 24 hours after the procedure:      Have a responsible adult stay with you. It is important to have someone help care for you until you are awake and alert.  Rest as needed.  Do not: ? Participate in activities in which you could fall or become injured. ? Drive. ? Use heavy machinery. ? Drink alcohol. ? Take sleeping pills or medicines that cause drowsiness. ? Make important decisions or sign legal documents. ? Take care of children on your own. Eating and drinking  Follow the diet that is recommended by your health care provider.  If you vomit, drink water, juice, or soup when you can drink without vomiting.  Make sure you have little or no nausea before eating solid foods. General instructions  Take over-the-counter  and prescription medicines only as told by your health care provider.  If you have sleep apnea, surgery and certain medicines can increase your risk for breathing problems. Follow instructions from your health care provider about wearing your sleep device: ? Anytime you are sleeping, including during daytime naps. ? While taking prescription pain medicines, sleeping medicines, or medicines that make you drowsy.  If you smoke, do not smoke without supervision.  Keep all follow-up visits as told by your health care provider. This is important. Contact a health care provider if:  You keep feeling nauseous or you keep vomiting.  You feel light-headed.  You develop a rash.  You have a fever. Get help right away if:  You have trouble breathing. Summary  For several hours after your procedure, you may feel sleepy and have poor judgment.  Have a responsible adult stay with you for at least 24 hours or until you are awake and alert. This information is not intended to replace advice given to you by your health care provider. Make sure you discuss any questions you have with your health care provider. Document Revised: 02/16/2018 Document Reviewed: 03/10/2016 Elsevier Patient Education  [place].

## 2020-09-12 ENCOUNTER — Other Ambulatory Visit: Payer: Self-pay

## 2020-09-12 ENCOUNTER — Other Ambulatory Visit (HOSPITAL_COMMUNITY)
Admission: RE | Admit: 2020-09-12 | Discharge: 2020-09-12 | Disposition: A | Discharge status: Home / Self Care | Payer: PPO | Source: Ambulatory Visit | Attending: Cardiology | Admitting: Cardiology

## 2020-09-12 ENCOUNTER — Encounter (HOSPITAL_COMMUNITY): Payer: Self-pay

## 2020-09-12 ENCOUNTER — Encounter (HOSPITAL_COMMUNITY)
Admission: RE | Admit: 2020-09-12 | Discharge: 2020-09-12 | Disposition: A | Discharge status: Home / Self Care | Payer: PPO | Source: Ambulatory Visit | Attending: Cardiology | Admitting: Cardiology

## 2020-09-12 DIAGNOSIS — I252 Old myocardial infarction: Secondary | ICD-10-CM | POA: Diagnosis not present

## 2020-09-12 DIAGNOSIS — K2900 Acute gastritis without bleeding: Secondary | ICD-10-CM | POA: Diagnosis not present

## 2020-09-12 DIAGNOSIS — R21 Rash and other nonspecific skin eruption: Secondary | ICD-10-CM | POA: Diagnosis not present

## 2020-09-12 DIAGNOSIS — Z8249 Family history of ischemic heart disease and other diseases of the circulatory system: Secondary | ICD-10-CM | POA: Diagnosis not present

## 2020-09-12 DIAGNOSIS — Z01812 Encounter for preprocedural laboratory examination: Secondary | ICD-10-CM | POA: Insufficient documentation

## 2020-09-12 DIAGNOSIS — Z90710 Acquired absence of both cervix and uterus: Secondary | ICD-10-CM | POA: Diagnosis not present

## 2020-09-12 DIAGNOSIS — I517 Cardiomegaly: Secondary | ICD-10-CM | POA: Diagnosis not present

## 2020-09-12 DIAGNOSIS — I110 Hypertensive heart disease with heart failure: Secondary | ICD-10-CM | POA: Diagnosis not present

## 2020-09-12 DIAGNOSIS — J189 Pneumonia, unspecified organism: Secondary | ICD-10-CM | POA: Diagnosis not present

## 2020-09-12 DIAGNOSIS — E039 Hypothyroidism, unspecified: Secondary | ICD-10-CM | POA: Diagnosis not present

## 2020-09-12 DIAGNOSIS — I4891 Unspecified atrial fibrillation: Secondary | ICD-10-CM | POA: Diagnosis not present

## 2020-09-12 DIAGNOSIS — J302 Other seasonal allergic rhinitis: Secondary | ICD-10-CM | POA: Diagnosis present

## 2020-09-12 DIAGNOSIS — Z9049 Acquired absence of other specified parts of digestive tract: Secondary | ICD-10-CM | POA: Diagnosis not present

## 2020-09-12 DIAGNOSIS — K921 Melena: Secondary | ICD-10-CM | POA: Diagnosis not present

## 2020-09-12 DIAGNOSIS — Z20822 Contact with and (suspected) exposure to covid-19: Secondary | ICD-10-CM | POA: Insufficient documentation

## 2020-09-12 DIAGNOSIS — K2950 Unspecified chronic gastritis without bleeding: Secondary | ICD-10-CM | POA: Diagnosis not present

## 2020-09-12 DIAGNOSIS — Z87891 Personal history of nicotine dependence: Secondary | ICD-10-CM | POA: Diagnosis not present

## 2020-09-12 DIAGNOSIS — Z7901 Long term (current) use of anticoagulants: Secondary | ICD-10-CM | POA: Diagnosis not present

## 2020-09-12 DIAGNOSIS — J811 Chronic pulmonary edema: Secondary | ICD-10-CM | POA: Diagnosis not present

## 2020-09-12 DIAGNOSIS — I5043 Acute on chronic combined systolic (congestive) and diastolic (congestive) heart failure: Secondary | ICD-10-CM | POA: Diagnosis not present

## 2020-09-12 DIAGNOSIS — I2510 Atherosclerotic heart disease of native coronary artery without angina pectoris: Secondary | ICD-10-CM | POA: Diagnosis not present

## 2020-09-12 DIAGNOSIS — K2970 Gastritis, unspecified, without bleeding: Secondary | ICD-10-CM | POA: Diagnosis not present

## 2020-09-12 DIAGNOSIS — E559 Vitamin D deficiency, unspecified: Secondary | ICD-10-CM | POA: Diagnosis present

## 2020-09-12 DIAGNOSIS — K2961 Other gastritis with bleeding: Secondary | ICD-10-CM | POA: Diagnosis not present

## 2020-09-12 DIAGNOSIS — I130 Hypertensive heart and chronic kidney disease with heart failure and stage 1 through stage 4 chronic kidney disease, or unspecified chronic kidney disease: Secondary | ICD-10-CM | POA: Diagnosis not present

## 2020-09-12 DIAGNOSIS — K219 Gastro-esophageal reflux disease without esophagitis: Secondary | ICD-10-CM | POA: Diagnosis not present

## 2020-09-12 DIAGNOSIS — Z79890 Hormone replacement therapy (postmenopausal): Secondary | ICD-10-CM | POA: Diagnosis not present

## 2020-09-12 DIAGNOSIS — B9681 Helicobacter pylori [H. pylori] as the cause of diseases classified elsewhere: Secondary | ICD-10-CM | POA: Diagnosis present

## 2020-09-12 DIAGNOSIS — D62 Acute posthemorrhagic anemia: Secondary | ICD-10-CM | POA: Diagnosis not present

## 2020-09-12 DIAGNOSIS — R0600 Dyspnea, unspecified: Secondary | ICD-10-CM | POA: Diagnosis not present

## 2020-09-12 DIAGNOSIS — N189 Chronic kidney disease, unspecified: Secondary | ICD-10-CM | POA: Diagnosis not present

## 2020-09-12 DIAGNOSIS — I509 Heart failure, unspecified: Secondary | ICD-10-CM | POA: Diagnosis present

## 2020-09-12 DIAGNOSIS — Z881 Allergy status to other antibiotic agents status: Secondary | ICD-10-CM | POA: Diagnosis not present

## 2020-09-12 DIAGNOSIS — Z79899 Other long term (current) drug therapy: Secondary | ICD-10-CM | POA: Diagnosis not present

## 2020-09-12 DIAGNOSIS — I4819 Other persistent atrial fibrillation: Secondary | ICD-10-CM | POA: Diagnosis not present

## 2020-09-12 DIAGNOSIS — E785 Hyperlipidemia, unspecified: Secondary | ICD-10-CM | POA: Diagnosis not present

## 2020-09-12 DIAGNOSIS — J9601 Acute respiratory failure with hypoxia: Secondary | ICD-10-CM | POA: Diagnosis not present

## 2020-09-12 HISTORY — DX: Nausea with vomiting, unspecified: R11.2

## 2020-09-12 HISTORY — DX: Nausea with vomiting, unspecified: Z98.890

## 2020-09-12 HISTORY — DX: Other complications of anesthesia, initial encounter: T88.59XA

## 2020-09-12 LAB — BASIC METABOLIC PANEL
Anion gap: 8 (ref 5–15)
BUN: 14 mg/dL (ref 8–23)
CO2: 28 mmol/L (ref 22–32)
Calcium: 8.5 mg/dL — ABNORMAL LOW (ref 8.9–10.3)
Chloride: 100 mmol/L (ref 98–111)
Creatinine, Ser: 1.13 mg/dL — ABNORMAL HIGH (ref 0.44–1.00)
GFR, Estimated: 50 mL/min — ABNORMAL LOW (ref >60)
Glucose, Bld: 103 mg/dL — ABNORMAL HIGH (ref 70–99)
Potassium: 3.9 mmol/L (ref 3.5–5.1)
Sodium: 136 mmol/L (ref 135–145)

## 2020-09-12 LAB — CBC WITH DIFFERENTIAL/PLATELET
Abs Immature Granulocytes: 0.01 10*3/uL (ref 0.00–0.07)
Basophils Absolute: 0 10*3/uL (ref 0.0–0.1)
Basophils Relative: 0 %
Eosinophils Absolute: 0.1 10*3/uL (ref 0.0–0.5)
Eosinophils Relative: 1 %
HCT: 33.8 % — ABNORMAL LOW (ref 36.0–46.0)
Hemoglobin: 10.9 g/dL — ABNORMAL LOW (ref 12.0–15.0)
Immature Granulocytes: 0 %
Lymphocytes Relative: 33 %
Lymphs Abs: 1.9 10*3/uL (ref 0.7–4.0)
MCH: 28.5 pg (ref 26.0–34.0)
MCHC: 32.2 g/dL (ref 30.0–36.0)
MCV: 88.5 fL (ref 80.0–100.0)
Monocytes Absolute: 0.4 10*3/uL (ref 0.1–1.0)
Monocytes Relative: 7 %
Neutro Abs: 3.2 10*3/uL (ref 1.7–7.7)
Neutrophils Relative %: 59 %
Platelets: 295 10*3/uL (ref 150–400)
RBC: 3.82 MIL/uL — ABNORMAL LOW (ref 3.87–5.11)
RDW: 17.1 % — ABNORMAL HIGH (ref 11.5–15.5)
WBC: 5.6 10*3/uL (ref 4.0–10.5)
nRBC: 0 % (ref 0.0–0.2)

## 2020-09-12 LAB — SARS CORONAVIRUS 2 (TAT 6-24 HRS): SARS Coronavirus 2: NEGATIVE

## 2020-09-12 LAB — PROTIME-INR
INR: 1.4 — ABNORMAL HIGH (ref 0.8–1.2)
Prothrombin Time: 16.6 seconds — ABNORMAL HIGH (ref 11.4–15.2)

## 2020-09-14 ENCOUNTER — Ambulatory Visit (HOSPITAL_COMMUNITY): Payer: PPO | Admitting: Anesthesiology

## 2020-09-14 ENCOUNTER — Encounter (HOSPITAL_COMMUNITY): Payer: Self-pay | Admitting: Cardiology

## 2020-09-14 ENCOUNTER — Encounter (HOSPITAL_COMMUNITY)
Admission: RE | Disposition: A | Discharge status: Home / Self Care | Payer: Self-pay | Source: Home / Self Care | Attending: Cardiology

## 2020-09-14 ENCOUNTER — Other Ambulatory Visit: Payer: Self-pay

## 2020-09-14 ENCOUNTER — Ambulatory Visit (HOSPITAL_BASED_OUTPATIENT_CLINIC_OR_DEPARTMENT_OTHER)
Admission: RE | Admit: 2020-09-14 | Discharge: 2020-09-14 | Disposition: A | Discharge status: Home / Self Care | Payer: PPO | Source: Home / Self Care | Attending: Cardiology | Admitting: Cardiology

## 2020-09-14 DIAGNOSIS — Z886 Allergy status to analgesic agent status: Secondary | ICD-10-CM | POA: Insufficient documentation

## 2020-09-14 DIAGNOSIS — E785 Hyperlipidemia, unspecified: Secondary | ICD-10-CM | POA: Insufficient documentation

## 2020-09-14 DIAGNOSIS — I2510 Atherosclerotic heart disease of native coronary artery without angina pectoris: Secondary | ICD-10-CM | POA: Insufficient documentation

## 2020-09-14 DIAGNOSIS — Z880 Allergy status to penicillin: Secondary | ICD-10-CM | POA: Insufficient documentation

## 2020-09-14 DIAGNOSIS — Z79899 Other long term (current) drug therapy: Secondary | ICD-10-CM | POA: Insufficient documentation

## 2020-09-14 DIAGNOSIS — I4819 Other persistent atrial fibrillation: Secondary | ICD-10-CM

## 2020-09-14 DIAGNOSIS — E039 Hypothyroidism, unspecified: Secondary | ICD-10-CM | POA: Insufficient documentation

## 2020-09-14 DIAGNOSIS — Z888 Allergy status to other drugs, medicaments and biological substances status: Secondary | ICD-10-CM | POA: Insufficient documentation

## 2020-09-14 DIAGNOSIS — I5042 Chronic combined systolic (congestive) and diastolic (congestive) heart failure: Secondary | ICD-10-CM | POA: Insufficient documentation

## 2020-09-14 DIAGNOSIS — Z7901 Long term (current) use of anticoagulants: Secondary | ICD-10-CM | POA: Insufficient documentation

## 2020-09-14 DIAGNOSIS — Z87891 Personal history of nicotine dependence: Secondary | ICD-10-CM | POA: Insufficient documentation

## 2020-09-14 DIAGNOSIS — I340 Nonrheumatic mitral (valve) insufficiency: Secondary | ICD-10-CM | POA: Insufficient documentation

## 2020-09-14 DIAGNOSIS — Z79890 Hormone replacement therapy (postmenopausal): Secondary | ICD-10-CM | POA: Insufficient documentation

## 2020-09-14 DIAGNOSIS — I252 Old myocardial infarction: Secondary | ICD-10-CM | POA: Insufficient documentation

## 2020-09-14 DIAGNOSIS — I130 Hypertensive heart and chronic kidney disease with heart failure and stage 1 through stage 4 chronic kidney disease, or unspecified chronic kidney disease: Secondary | ICD-10-CM | POA: Diagnosis not present

## 2020-09-14 DIAGNOSIS — N189 Chronic kidney disease, unspecified: Secondary | ICD-10-CM | POA: Diagnosis not present

## 2020-09-14 DIAGNOSIS — I4891 Unspecified atrial fibrillation: Secondary | ICD-10-CM | POA: Diagnosis not present

## 2020-09-14 DIAGNOSIS — Z885 Allergy status to narcotic agent status: Secondary | ICD-10-CM | POA: Insufficient documentation

## 2020-09-14 DIAGNOSIS — I110 Hypertensive heart disease with heart failure: Secondary | ICD-10-CM | POA: Insufficient documentation

## 2020-09-14 DIAGNOSIS — I509 Heart failure, unspecified: Secondary | ICD-10-CM | POA: Diagnosis not present

## 2020-09-14 HISTORY — PX: CARDIOVERSION: SHX1299

## 2020-09-14 SURGERY — CARDIOVERSION
Anesthesia: General

## 2020-09-14 MED ORDER — PROPOFOL 10 MG/ML IV BOLUS
INTRAVENOUS | Status: DC | PRN
  Administered 2020-09-14: 40 mg via INTRAVENOUS
  Administered 2020-09-14: 80 mg via INTRAVENOUS

## 2020-09-14 MED ORDER — LACTATED RINGERS IV SOLN: INTRAVENOUS | Status: DC | PRN

## 2020-09-14 MED ORDER — LIDOCAINE HCL (CARDIAC) PF 100 MG/5ML IV SOSY
PREFILLED_SYRINGE | INTRAVENOUS | Status: DC | PRN
  Administered 2020-09-14: 40 mg via INTRAVENOUS

## 2020-09-14 MED ORDER — LACTATED RINGERS IV SOLN: Freq: Once | INTRAVENOUS | Status: AC

## 2020-09-14 NOTE — Transfer of Care (Signed)
Immediate Anesthesia Transfer of Care Note  Patient: Maria Fry  Procedure(s) Performed: CARDIOVERSION (N/A )  Patient Location: PACU  Anesthesia Type:General  Level of Consciousness: awake and patient cooperative  Airway & Oxygen Therapy: Patient Spontanous Breathing  Post-op Assessment: Report given to RN and Post -op Vital signs reviewed and stable  Post vital signs: Reviewed and stable  Last Vitals:  Vitals Value Taken Time  BP 105/65 09/14/20 1027  Temp    Pulse 68 09/14/20 1028  Resp 27 09/14/20 1028  SpO2 95 % 09/14/20 1028  Vitals shown include unvalidated device data.  Last Pain:  Vitals:   09/14/20 0923  TempSrc: Oral  PainSc: 0-No pain         Complications: No complications documented.

## 2020-09-14 NOTE — Progress Notes (Addendum)
Electrical Cardioversion Procedure Note Maria Fry [id number] [birth year]  Procedure: Electrical Cardioversion Indications:  Atrial Fibrillation  Procedure Details Consent: Direct Current Cardioversion Time Out: Verified patient identification, verified procedure, site/side was marked, verified correct patient position, special equipment/implants available, medications/allergies/relevent history reviewed, required imaging and test results available.  time out performed: 1007  Patient placed on cardiac monitor, pulse oximetry, supplemental oxygen as necessary.  Sedation given: per anesthesia Pacer pads placed  Anterior and posterior pads  Cardioverted 2  time(s).  Cardioverted at  120 joules at 1010 and 150 joules at 1014.  Evaluation Findings: Post procedure EKG shows:  Normal sinus rhythm Complications: none Patient did tolerate procedure well. Pads sites shows no signs of redness or irritation.  Harmon Pier 09/14/2020, 10:32 AM

## 2020-09-14 NOTE — CV Procedure (Signed)
Elective direct-current cardioversion  Indication: Persistent atrial fibrillation  Description of procedure: After informed consent was obtained, the patient was taken to the procedure suite where a timeout was performed.  Anterior and posterior pads were placed in standard fashion and connected to a biphasic defibrillator.  Deep sedation was achieved by the anesthesia service with use of propofol, please refer to their records for dosing information.  With anterior sandbag in place, a single synchronized 120 J shock was delivered with conversion from atrial fibrillation to atrial flutter with variable conduction.  A second synchronized 150 J shock was subsequently delivered with successful restoration of sinus rhythm.  Patient remained hemodynamically stable throughout and there were no immediate complications.  Rhythm confirmed by ECG.  Satira Sark, M.D., F.A.C.C.

## 2020-09-14 NOTE — Anesthesia Preprocedure Evaluation (Addendum)
Anesthesia Evaluation  Patient identified by MRN, date of birth, ID band Patient awake    Reviewed: Allergy & Precautions, NPO status , Patient's Chart, lab work & pertinent test results, reviewed documented beta blocker date and time   History of Anesthesia Complications (+) PONV and history of anesthetic complications  Airway Mallampati: III  TM Distance: >3 FB     Dental  (+) Dental Advisory Given, Missing   Pulmonary shortness of breath and with exertion, former smoker,    Pulmonary exam normal breath sounds clear to auscultation       Cardiovascular Exercise Tolerance: Poor hypertension, Pt. on medications and Pt. on home beta blockers + angina with exertion + CAD, + Past MI, +CHF and + DOE  + dysrhythmias Atrial Fibrillation  Rhythm:Irregular - Systolic murmurs, - Diastolic murmurs, - Carotid Bruit, - Peripheral Edema and - Systolic Click 1. Left ventricular ejection fraction, by estimation, is 40 to 45%. The left ventricle has mildly decreased function. The left ventricle demonstrates global hypokinesis. There is severe left ventricular hypertrophy. Left ventricular diastolic parameters are indeterminate.  2. Right ventricular systolic function is normal. The right ventricular size is normal. There is moderately elevated pulmonary artery systolic pressure.  3. Left atrial size was severely dilated.  4. Right atrial size was severely dilated.  5. The mitral valve is normal in structure. Mild to moderate mitral valve  regurgitation. No evidence of mitral stenosis.  6. Tricuspid valve regurgitation is moderate.  7. The aortic valve is tricuspid. Aortic valve regurgitation is mild to moderate.  8. Moderate pulmonary HTN, PASP is 50 mmHg.  9. The inferior vena cava is dilated in size with <50% respiratory variability, suggesting right atrial pressure of 15 mmHg.   2016 cath -   Dist LAD lesion, 100% stenosed.  Mid LAD  lesion, 10% stenosed.  The left ventricular systolic function is normal.   1. 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 Good LV function  Plan: DAPT, beta blocker and risk factor modification    Neuro/Psych negative psych ROS   GI/Hepatic negative GI ROS, Neg liver ROS,   Endo/Other  Hypothyroidism Morbid obesity  Renal/GU Renal InsufficiencyRenal disease     Musculoskeletal  (+) Arthritis ,   Abdominal   Peds  Hematology  (+) anemia ,   Anesthesia Other Findings   Reproductive/Obstetrics negative OB ROS                            Anesthesia Physical Anesthesia Plan  ASA: IV  Anesthesia Plan: General   Post-op Pain Management:    Induction: Intravenous  PONV Risk Score and Plan:   Airway Management Planned: Nasal Cannula, Natural Airway and Simple Face Mask  Additional Equipment:   Intra-op Plan:   Post-operative Plan:   Informed Consent: I have reviewed the patients History and Physical, chart, labs and discussed the procedure including the risks, benefits and alternatives for the proposed anesthesia with the patient or authorized representative who has indicated his/her understanding and acceptance.     Dental advisory given  Plan Discussed with: CRNA and Surgeon  Anesthesia Plan Comments:        Anesthesia Quick Evaluation

## 2020-09-14 NOTE — Interval H&P Note (Signed)
History and Physical Interval Note:  09/14/2020 10:02 AM  Patient presents for elective cardioversion of persistent atrial fibrillation diagnosed in September.  She reports compliance with Eliquis and has also been on Lopressor 25 mg twice daily.  I reviewed her lab work and ECG.  Informed consent obtained.  Patient is ready to proceed.  Satira Sark, M.D., F.A.C.C.

## 2020-09-14 NOTE — Anesthesia Postprocedure Evaluation (Signed)
Anesthesia Post Note  Patient: Maria Fry  Procedure(s) Performed: CARDIOVERSION (N/A )  Patient location during evaluation: PACU Anesthesia Type: General Level of consciousness: awake Pain management: pain level controlled Vital Signs Assessment: post-procedure vital signs reviewed and stable Respiratory status: spontaneous breathing Cardiovascular status: stable Postop Assessment: no apparent nausea or vomiting Anesthetic complications: no   No complications documented.   Last Vitals:  Vitals:   09/14/20 0923  BP: 133/88  Pulse: 78  Resp: (!) 25  Temp: 36.5 C  SpO2: 97%    Last Pain:  Vitals:   09/14/20 0923  TempSrc: Oral  PainSc: 0-No pain                 Everette Rank

## 2020-09-14 NOTE — Discharge Instructions (Addendum)
Electrical Cardioversion Electrical cardioversion is the delivery of a jolt of electricity to restore a normal rhythm to the heart. A rhythm that is too fast or is not regular keeps the heart from pumping well. In this procedure, sticky patches or metal paddles are placed on the chest to deliver electricity to the heart from a device. This procedure may be done in an emergency if:  There is low or no blood pressure as a result of the heart rhythm.  Normal rhythm must be restored as fast as possible to protect the brain and heart from further damage.  It may save a life. This may also be a scheduled procedure for irregular or fast heart rhythms that are not immediately life-threatening. Tell a health care provider about:  Any allergies you have.  All medicines you are taking, including vitamins, herbs, eye drops, creams, and over-the-counter medicines.  Any problems you or family members have had with anesthetic medicines.  Any blood disorders you have.  Any surgeries you have had.  Any medical conditions you have.  Whether you are pregnant or may be pregnant. What are the risks? Generally, this is a safe procedure. However, problems may occur, including:  Allergic reactions to medicines.  A blood clot that breaks free and travels to other parts of your body.  The possible return of an abnormal heart rhythm within hours or days after the procedure.  Your heart stopping (cardiac arrest). This is rare. What happens before the procedure? Medicines  Your health care provider may have you start taking: ? Blood-thinning medicines (anticoagulants) so your blood does not clot as easily. ? Medicines to help stabilize your heart rate and rhythm.  Ask your health care provider about: ? Changing or stopping your regular medicines. This is especially important if you are taking diabetes medicines or blood thinners. ? Taking medicines such as aspirin and ibuprofen. These medicines can  thin your blood. Do not take these medicines unless your health care provider tells you to take them. ? Taking over-the-counter medicines, vitamins, herbs, and supplements. General instructions  Follow instructions from your health care provider about eating or drinking restrictions.  Plan to have someone take you home from the hospital or clinic.  If you will be going home right after the procedure, plan to have someone with you for 24 hours.  Ask your health care provider what steps will be taken to help prevent infection. These may include washing your skin with a germ-killing soap. What happens during the procedure?   An IV will be inserted into one of your veins.  Sticky patches (electrodes) or metal paddles may be placed on your chest.  You will be given a medicine to help you relax (sedative).  An electrical shock will be delivered. The procedure may vary among health care providers and hospitals. What can I expect after the procedure?  Your blood pressure, heart rate, breathing rate, and blood oxygen level will be monitored until you leave the hospital or clinic.  Your heart rhythm will be watched to make sure it does not change.  You may have some redness on the skin where the shocks were given. Follow these instructions at home:  Do not drive for 24 hours if you were given a sedative during your procedure.  Take over-the-counter and prescription medicines only as told by your health care provider.  Ask your health care provider how to check your pulse. Check it often.  Rest for 48 hours after the procedure or   as told by your health care provider.  Avoid or limit your caffeine use as told by your health care provider.  Keep all follow-up visits as told by your health care provider. This is important. Contact a health care provider if:  You feel like your heart is beating too quickly or your pulse is not regular.  You have a serious muscle cramp that does not go  away. Get help right away if:  You have discomfort in your chest.  You are dizzy or you feel faint.  You have trouble breathing or you are short of breath.  Your speech is slurred.  You have trouble moving an arm or leg on one side of your body.  Your fingers or toes turn cold or blue. Summary  Electrical cardioversion is the delivery of a jolt of electricity to restore a normal rhythm to the heart.  This procedure may be done right away in an emergency or may be a scheduled procedure if the condition is not an emergency.  Generally, this is a safe procedure.  After the procedure, check your pulse often as told by your health care provider. This information is not intended to replace advice given to you by your health care provider. Make sure you discuss any questions you have with your health care provider. Document Revised: 06/21/2019 Document Reviewed: 06/21/2019 [employer] Patient Education  2020 [employer]  

## 2020-09-15 ENCOUNTER — Other Ambulatory Visit: Payer: Self-pay

## 2020-09-15 ENCOUNTER — Ambulatory Visit
Admission: EM | Admit: 2020-09-15 | Discharge: 2020-09-15 | Disposition: A | Discharge status: Home / Self Care | Payer: PPO | Attending: Emergency Medicine | Admitting: Emergency Medicine

## 2020-09-15 ENCOUNTER — Telehealth: Payer: Self-pay

## 2020-09-15 ENCOUNTER — Encounter (HOSPITAL_COMMUNITY): Payer: Self-pay | Admitting: Cardiology

## 2020-09-15 ENCOUNTER — Encounter (HOSPITAL_COMMUNITY): Payer: Self-pay | Admitting: *Deleted

## 2020-09-15 ENCOUNTER — Inpatient Hospital Stay (HOSPITAL_COMMUNITY)
Admission: EM | Admit: 2020-09-15 | Discharge: 2020-09-17 | DRG: 291 | Disposition: A | Discharge status: Home / Self Care | Payer: PPO | Attending: Internal Medicine | Admitting: Internal Medicine

## 2020-09-15 ENCOUNTER — Telehealth: Payer: Self-pay | Admitting: Cardiology

## 2020-09-15 ENCOUNTER — Emergency Department (HOSPITAL_COMMUNITY): Payer: PPO

## 2020-09-15 DIAGNOSIS — E669 Obesity, unspecified: Secondary | ICD-10-CM

## 2020-09-15 DIAGNOSIS — Z79890 Hormone replacement therapy (postmenopausal): Secondary | ICD-10-CM

## 2020-09-15 DIAGNOSIS — Z881 Allergy status to other antibiotic agents status: Secondary | ICD-10-CM | POA: Diagnosis not present

## 2020-09-15 DIAGNOSIS — K219 Gastro-esophageal reflux disease without esophagitis: Secondary | ICD-10-CM | POA: Diagnosis present

## 2020-09-15 DIAGNOSIS — Z90710 Acquired absence of both cervix and uterus: Secondary | ICD-10-CM

## 2020-09-15 DIAGNOSIS — E039 Hypothyroidism, unspecified: Secondary | ICD-10-CM | POA: Diagnosis present

## 2020-09-15 DIAGNOSIS — K921 Melena: Secondary | ICD-10-CM

## 2020-09-15 DIAGNOSIS — Z20822 Contact with and (suspected) exposure to covid-19: Secondary | ICD-10-CM | POA: Diagnosis present

## 2020-09-15 DIAGNOSIS — J302 Other seasonal allergic rhinitis: Secondary | ICD-10-CM | POA: Diagnosis present

## 2020-09-15 DIAGNOSIS — J9601 Acute respiratory failure with hypoxia: Secondary | ICD-10-CM | POA: Diagnosis present

## 2020-09-15 DIAGNOSIS — E785 Hyperlipidemia, unspecified: Secondary | ICD-10-CM | POA: Diagnosis present

## 2020-09-15 DIAGNOSIS — D62 Acute posthemorrhagic anemia: Secondary | ICD-10-CM | POA: Diagnosis present

## 2020-09-15 DIAGNOSIS — K2970 Gastritis, unspecified, without bleeding: Secondary | ICD-10-CM | POA: Diagnosis not present

## 2020-09-15 DIAGNOSIS — I5043 Acute on chronic combined systolic (congestive) and diastolic (congestive) heart failure: Secondary | ICD-10-CM | POA: Diagnosis present

## 2020-09-15 DIAGNOSIS — I110 Hypertensive heart disease with heart failure: Principal | ICD-10-CM | POA: Diagnosis present

## 2020-09-15 DIAGNOSIS — K2961 Other gastritis with bleeding: Secondary | ICD-10-CM | POA: Diagnosis present

## 2020-09-15 DIAGNOSIS — E559 Vitamin D deficiency, unspecified: Secondary | ICD-10-CM | POA: Diagnosis present

## 2020-09-15 DIAGNOSIS — Z87891 Personal history of nicotine dependence: Secondary | ICD-10-CM

## 2020-09-15 DIAGNOSIS — I252 Old myocardial infarction: Secondary | ICD-10-CM

## 2020-09-15 DIAGNOSIS — I4891 Unspecified atrial fibrillation: Secondary | ICD-10-CM | POA: Diagnosis not present

## 2020-09-15 DIAGNOSIS — B9681 Helicobacter pylori [H. pylori] as the cause of diseases classified elsewhere: Secondary | ICD-10-CM | POA: Diagnosis present

## 2020-09-15 DIAGNOSIS — Z1211 Encounter for screening for malignant neoplasm of colon: Secondary | ICD-10-CM

## 2020-09-15 DIAGNOSIS — Z888 Allergy status to other drugs, medicaments and biological substances status: Secondary | ICD-10-CM

## 2020-09-15 DIAGNOSIS — I2510 Atherosclerotic heart disease of native coronary artery without angina pectoris: Secondary | ICD-10-CM | POA: Diagnosis present

## 2020-09-15 DIAGNOSIS — I509 Heart failure, unspecified: Principal | ICD-10-CM

## 2020-09-15 DIAGNOSIS — I4819 Other persistent atrial fibrillation: Secondary | ICD-10-CM | POA: Diagnosis present

## 2020-09-15 DIAGNOSIS — Z7901 Long term (current) use of anticoagulants: Secondary | ICD-10-CM

## 2020-09-15 DIAGNOSIS — Z8249 Family history of ischemic heart disease and other diseases of the circulatory system: Secondary | ICD-10-CM

## 2020-09-15 DIAGNOSIS — Z79899 Other long term (current) drug therapy: Secondary | ICD-10-CM | POA: Diagnosis not present

## 2020-09-15 DIAGNOSIS — Z885 Allergy status to narcotic agent status: Secondary | ICD-10-CM

## 2020-09-15 DIAGNOSIS — Z9049 Acquired absence of other specified parts of digestive tract: Secondary | ICD-10-CM | POA: Diagnosis not present

## 2020-09-15 DIAGNOSIS — K2900 Acute gastritis without bleeding: Secondary | ICD-10-CM | POA: Diagnosis not present

## 2020-09-15 LAB — CBC WITH DIFFERENTIAL/PLATELET
Abs Immature Granulocytes: 0.01 10*3/uL (ref 0.00–0.07)
Basophils Absolute: 0 10*3/uL (ref 0.0–0.1)
Basophils Relative: 0 %
Eosinophils Absolute: 0.1 10*3/uL (ref 0.0–0.5)
Eosinophils Relative: 1 %
HCT: 31.8 % — ABNORMAL LOW (ref 36.0–46.0)
Hemoglobin: 10.3 g/dL — ABNORMAL LOW (ref 12.0–15.0)
Immature Granulocytes: 0 %
Lymphocytes Relative: 29 %
Lymphs Abs: 2 10*3/uL (ref 0.7–4.0)
MCH: 28.9 pg (ref 26.0–34.0)
MCHC: 32.4 g/dL (ref 30.0–36.0)
MCV: 89.3 fL (ref 80.0–100.0)
Monocytes Absolute: 0.3 10*3/uL (ref 0.1–1.0)
Monocytes Relative: 5 %
Neutro Abs: 4.4 10*3/uL (ref 1.7–7.7)
Neutrophils Relative %: 65 %
Platelets: 260 10*3/uL (ref 150–400)
RBC: 3.56 MIL/uL — ABNORMAL LOW (ref 3.87–5.11)
RDW: 17.3 % — ABNORMAL HIGH (ref 11.5–15.5)
WBC: 6.8 10*3/uL (ref 4.0–10.5)
nRBC: 0 % (ref 0.0–0.2)

## 2020-09-15 LAB — COMPREHENSIVE METABOLIC PANEL
ALT: 35 U/L (ref 0–44)
AST: 45 U/L — ABNORMAL HIGH (ref 15–41)
Albumin: 3.3 g/dL — ABNORMAL LOW (ref 3.5–5.0)
Alkaline Phosphatase: 88 U/L (ref 38–126)
Anion gap: 13 (ref 5–15)
BUN: 16 mg/dL (ref 8–23)
CO2: 23 mmol/L (ref 22–32)
Calcium: 8.9 mg/dL (ref 8.9–10.3)
Chloride: 102 mmol/L (ref 98–111)
Creatinine, Ser: 1.04 mg/dL — ABNORMAL HIGH (ref 0.44–1.00)
GFR, Estimated: 56 mL/min — ABNORMAL LOW (ref >60)
Glucose, Bld: 99 mg/dL (ref 70–99)
Potassium: 3.8 mmol/L (ref 3.5–5.1)
Sodium: 138 mmol/L (ref 135–145)
Total Bilirubin: 0.7 mg/dL (ref 0.3–1.2)
Total Protein: 7.5 g/dL (ref 6.5–8.1)

## 2020-09-15 LAB — POC HEMOCCULT BLD/STL (OFFICE/1-CARD/DIAGNOSTIC): Fecal Occult Blood, POC: POSITIVE — AB

## 2020-09-15 LAB — RESPIRATORY PANEL BY RT PCR (FLU A&B, COVID)
Influenza A by PCR: NEGATIVE
Influenza B by PCR: NEGATIVE
SARS Coronavirus 2 by RT PCR: NEGATIVE

## 2020-09-15 LAB — BRAIN NATRIURETIC PEPTIDE: B Natriuretic Peptide: 1278 pg/mL — ABNORMAL HIGH (ref 0.0–100.0)

## 2020-09-15 MED ORDER — FERROUS SULFATE 325 (65 FE) MG PO TABS
325.0000 mg | ORAL_TABLET | Freq: Every day | ORAL | Status: DC
  Administered 2020-09-16 – 2020-09-17 (×2): 325 mg via ORAL
  Filled 2020-09-15 (×2): qty 1

## 2020-09-15 MED ORDER — GABAPENTIN 300 MG PO CAPS
300.0000 mg | ORAL_CAPSULE | Freq: Two times a day (BID) | ORAL | Status: DC
  Administered 2020-09-16 – 2020-09-17 (×3): 300 mg via ORAL
  Filled 2020-09-15 (×3): qty 1

## 2020-09-15 MED ORDER — FUROSEMIDE 10 MG/ML IJ SOLN
40.0000 mg | Freq: Once | INTRAMUSCULAR | Status: AC
  Administered 2020-09-15: 40 mg via INTRAVENOUS
  Filled 2020-09-15: qty 4

## 2020-09-15 MED ORDER — SODIUM CHLORIDE 0.9 % IV BOLUS: 1000.0000 mL | Freq: Once | INTRAVENOUS | Status: DC

## 2020-09-15 MED ORDER — ONDANSETRON HCL 4 MG/2ML IJ SOLN: 4.0000 mg | Freq: Four times a day (QID) | INTRAMUSCULAR | Status: DC | PRN

## 2020-09-15 MED ORDER — RANOLAZINE ER 500 MG PO TB12
500.0000 mg | ORAL_TABLET | Freq: Two times a day (BID) | ORAL | Status: DC
  Administered 2020-09-16 – 2020-09-17 (×3): 500 mg via ORAL
  Filled 2020-09-15 (×7): qty 1

## 2020-09-15 MED ORDER — ACETAMINOPHEN 325 MG PO TABS
650.0000 mg | ORAL_TABLET | ORAL | Status: DC | PRN
  Administered 2020-09-17: 650 mg via ORAL
  Filled 2020-09-15: qty 2

## 2020-09-15 MED ORDER — LORATADINE 10 MG PO TABS
10.0000 mg | ORAL_TABLET | Freq: Every day | ORAL | Status: DC
  Administered 2020-09-16 – 2020-09-17 (×2): 10 mg via ORAL
  Filled 2020-09-15 (×2): qty 1

## 2020-09-15 MED ORDER — METOPROLOL TARTRATE 25 MG PO TABS
25.0000 mg | ORAL_TABLET | Freq: Two times a day (BID) | ORAL | Status: DC
  Administered 2020-09-16 (×2): 25 mg via ORAL
  Filled 2020-09-15 (×3): qty 1

## 2020-09-15 MED ORDER — FUROSEMIDE 10 MG/ML IJ SOLN
40.0000 mg | Freq: Every day | INTRAMUSCULAR | Status: DC
  Administered 2020-09-16: 40 mg via INTRAVENOUS
  Filled 2020-09-15: qty 4

## 2020-09-15 MED ORDER — APIXABAN 5 MG PO TABS: 5.0000 mg | ORAL_TABLET | Freq: Two times a day (BID) | ORAL | Status: DC

## 2020-09-15 MED ORDER — SODIUM CHLORIDE 0.9 % IV SOLN: 250.0000 mL | INTRAVENOUS | Status: DC | PRN

## 2020-09-15 MED ORDER — SODIUM CHLORIDE 0.9% FLUSH: 3.0000 mL | INTRAVENOUS | Status: DC | PRN

## 2020-09-15 MED ORDER — LEVOTHYROXINE SODIUM 100 MCG PO TABS
100.0000 ug | ORAL_TABLET | Freq: Every day | ORAL | Status: DC
  Administered 2020-09-16: 100 ug via ORAL
  Filled 2020-09-15: qty 2

## 2020-09-15 MED ORDER — SODIUM CHLORIDE 0.9% FLUSH
3.0000 mL | Freq: Two times a day (BID) | INTRAVENOUS | Status: DC
  Administered 2020-09-16 (×3): 3 mL via INTRAVENOUS

## 2020-09-15 MED ORDER — ATORVASTATIN CALCIUM 40 MG PO TABS
80.0000 mg | ORAL_TABLET | Freq: Every day | ORAL | Status: DC
  Administered 2020-09-16: 80 mg via ORAL
  Filled 2020-09-15: qty 2

## 2020-09-15 MED ORDER — ALBUTEROL SULFATE HFA 108 (90 BASE) MCG/ACT IN AERS
2.0000 | INHALATION_SPRAY | Freq: Once | RESPIRATORY_TRACT | Status: AC
  Administered 2020-09-15: 2 via RESPIRATORY_TRACT
  Filled 2020-09-15: qty 6.7

## 2020-09-15 MED ORDER — CALCIUM CARBONATE 1250 (500 CA) MG PO TABS
1.0000 | ORAL_TABLET | Freq: Two times a day (BID) | ORAL | Status: DC
  Administered 2020-09-16 – 2020-09-17 (×2): 500 mg via ORAL
  Filled 2020-09-15 (×6): qty 1

## 2020-09-15 MED ORDER — SODIUM CHLORIDE 0.9 % IV BOLUS
500.0000 mL | Freq: Once | INTRAVENOUS | Status: AC
  Administered 2020-09-15: 500 mL via INTRAVENOUS

## 2020-09-15 NOTE — Telephone Encounter (Signed)
I spoke with pt, she had cardioversion yesterday, she is still on eliquis Later that night had diarrhea with black stool. This has persisted, she had stomach griping /pain with eating and continues to pass loose stool No bright red blood She has been directed to UC to be evaluated

## 2020-09-15 NOTE — Telephone Encounter (Signed)
Patient called stating that around 3AM she started having diarrhea. States that the diarrhea is very black.

## 2020-09-15 NOTE — ED Triage Notes (Signed)
Pt c/o black tarry stools that started this am; pt c/o lower abdominal pain when she eats

## 2020-09-15 NOTE — ED Notes (Signed)
Pt in bed, pt denies pain, one Liter emptied from pure wick can.  Family at bedside.

## 2020-09-15 NOTE — ED Triage Notes (Signed)
Pt presents with c/o diarrhea that began last night post cardioversion. Pt states that she had loose black colored stools this morning, sent by pcp for [facility]

## 2020-09-15 NOTE — Telephone Encounter (Signed)
Patient has called stating that she had a cardioversion yesterday. She started having diarrhea in the middle of the night and very dark, black in color. She called her cardiologist this morning and she states she was told to call her PCP or go to Urgent Care. Pt is needing a call back to let her know what she needs to do.

## 2020-09-15 NOTE — ED Provider Notes (Signed)
[facility] Provider Note   CSN: [id number] Arrival date & time: 09/15/20  1542     History Chief Complaint  Patient presents with   Melena    Maria Fry is a 69 y.o. female.  HPI 69 year old female presents after going to urgent care for black diarrhea. The patient states that since 3 AM she has had 5 diarrheal bowel movements that are black. Often it occurs immediately after eating. She will get lower abdominal cramping and then sometimes has been had diarrhea on herself. There is no vomiting, fever, chest pain, shortness of breath or abdominal pain when she is not eating. She has not seen bright red blood. She went to urgent care where they did a rectal exam that was Hemoccult positive. She is on Eliquis and was cardioverted for atrial fibrillation yesterday.   Past Medical History:  Diagnosis Date   CAD (coronary artery disease)    a. 06/07/15 NSTEMI/Cath: Dist LAD 100%, mid LAD 10%. Otw nl cors. Nl EF w/ inferoapical AK.   Chronic combined systolic (congestive) and diastolic (congestive) heart failure ([place]) 04/23/2016   a. 04/2016 Echo: EF 45%, Gr2 DD; b. 11/2019 Echo: EF 45-50%, mod LVH. Gr2 DD. Nl RV fxn. Sev dil LA.    Complication of anesthesia    Essential hypertension    Hip pain    Hyperlipidemia    Hypothyroidism    MI (myocardial infarction) ([place]) 06/05/2015   PONV (postoperative nausea and vomiting)    Vitamin D deficiency     Patient Active Problem List   Diagnosis Date Noted   CHF exacerbation ([place]) 09/15/2020   Atrial fibrillation ([place]) 08/23/2020   Chest pain 01/14/2020   Stable angina ([place]) 01/14/2020   Abnormal nuclear stress test 12/08/2019   Hypokalemia 11/28/2019   Anemia 11/28/2019   Hyperlipidemia 11/27/2019   Peripheral edema 05/18/2019   OA (osteoarthritis) of knee 05/18/2019   DDD (degenerative disc disease), lumbar 05/18/2019   Obesity 12/26/2017   Seasonal allergies 08/19/2016   Glucose  intolerance (impaired glucose tolerance) 05/14/2016   Chronic hip pain 05/14/2016   Chronic combined systolic and diastolic heart failure ([place]) 05/14/2016   Hypothyroidism following radioiodine therapy 11/30/2015   CAD (coronary artery disease)    NSTEMI (non-ST elevated myocardial infarction) ([place])    Hypertension 06/05/2015    Past Surgical History:  Procedure Laterality Date   ABDOMINAL HYSTERECTOMY     CARDIAC CATHETERIZATION N/A 06/06/2015   Procedure: Left Heart Cath and Coronary Angiography;  Surgeon: Peter M Martinique, MD;  Location: [facility];  Service: Cardiovascular;  Laterality: N/A;   CARDIOVERSION N/A 09/14/2020   Procedure: CARDIOVERSION;  Surgeon: Satira Sark, MD;  [facility];  Service: Cardiovascular;  Laterality: N/A;   CHOLECYSTECTOMY     COLONOSCOPY     COLONOSCOPY N/A 08/13/2017   Procedure: COLONOSCOPY;  Surgeon: Daneil Dolin, MD;  [facility];  Service: Endoscopy;  Laterality: N/A;  8:30 AM   ESOPHAGOGASTRODUODENOSCOPY N/A 09/11/2016   Procedure: ESOPHAGOGASTRODUODENOSCOPY (EGD);  Surgeon: Daneil Dolin, MD;  [facility];  Service: Endoscopy;  Laterality: N/A;  7:45 am - moved to 10/11 @ 10:30   Heel spurs     MALONEY DILATION N/A 09/11/2016   Procedure: Venia Minks DILATION;  Surgeon: Daneil Dolin, MD;  [facility];  Service: Endoscopy;  Laterality: N/A;   POLYPECTOMY  08/13/2017   Procedure: POLYPECTOMY;  Surgeon: Daneil Dolin, MD;  [facility];  Service: Endoscopy;;  colon   SHOULDER ARTHROSCOPY     TUBAL LIGATION       OB History    Gravida  3   Para  3   Term  3   Preterm      AB      Living  3     SAB      TAB      Ectopic      Multiple      Live Births              Family History  Problem Relation Age of Onset   Heart failure Father        Deceased   Heart attack Father        Deceased   Stroke Sister        Deceased   Heart  failure Brother        Deceased   Cancer Brother        Deceased   Heart attack Brother        Deceased   Colon cancer Neg Hx     Social History   Tobacco Use   Smoking status: Former Smoker    Types: Cigarettes    Start date: 12/02/1977    Quit date: 12/02/2002    Years since quitting: 17.8   Smokeless tobacco: Never Used  Vaping Use   Vaping Use: Never used  Substance Use Topics   Alcohol use: No    Alcohol/week: 0.0 standard drinks   Drug use: No    Home Medications Prior to Admission medications   Medication Sig Start Date End Date Taking? Authorizing Provider  acetaminophen (TYLENOL) 500 MG tablet Take 500 mg by mouth every 6 (six) hours as needed for mild pain or moderate pain. Reported on 04/22/2016    [provider]  apixaban (ELIQUIS) 5 MG TABS tablet Take 1 tablet (5 mg total) by mouth 2 (two) times daily. 08/18/20   Barrett, Evelene Croon, PA-C  atorvastatin (LIPITOR) 80 MG tablet Take 1 tablet (80 mg total) by mouth at bedtime. 01/15/20   Roxan Hockey, MD  calcium carbonate (CALCIUM 600) 600 MG TABS tablet Take 1 tablet (600 mg total) by mouth 2 (two) times daily with a meal. 08/23/20   Hixton, [name], MD  Cholecalciferol (VITAMIN D) 50 MCG (2000 UT) CAPS Take 1 tablet daily Patient taking differently: Take 2,000 Units by mouth daily.  08/23/20   Alycia Rossetti, MD  ferrous sulfate 325 (65 FE) MG EC tablet Take 325 mg by mouth daily.    [provider]  furosemide (LASIX) 40 MG tablet Take 40 mg twice a day 4 days/week (Tuesday, Thursday, Saturday, Sunday) and take 40 mg  daily 3 days/week (Monday, Wednesday and Friday) Patient taking differently: Take 40 mg by mouth See admin instructions. Take 40 mg by mouth once daily on Tuesday, Thursday, Saturday, Sunday and take 40 mg  Twice daily on Monday, Wednesday and Friday 08/31/20   Barrett, Evelene Croon, PA-C  gabapentin (NEURONTIN) 300 MG capsule Take 300 mg by mouth 2 (two) times daily.     [provider]  KLOR-CON M15 15 MEQ tablet Take 1 tablet by mouth twice daily Patient taking differently: Take 15 mEq by mouth 2 (two) times daily.  03/08/20   [name], MD  levothyroxine (SYNTHROID) 100 MCG tablet TAKE 1 TABLET BY MOUTH ONCE DAILY BEFORE BREAKFAST Patient taking differently: Take 100 mcg by mouth  daily before breakfast.  06/14/20   Nida, Marella Chimes, MD  loratadine (CLARITIN) 10 MG tablet Take 10 mg by mouth daily.     [provider]  metoprolol tartrate (LOPRESSOR) 25 MG tablet Take 1 tablet (25 mg total) by mouth 2 (two) times daily. 08/18/20 11/16/20  Barrett, Evelene Croon, PA-C  nitroGLYCERIN (NITROSTAT) 0.4 MG SL tablet DISSOLVE ONE TABLET UNDER THE TONGUE EVERY 5 MINUTES AS NEEDED FOR CHEST PAIN.  DO NOT EXCEED A TOTAL OF 3 DOSES IN 15 MINUTES Patient taking differently: Place 0.4 mg under the tongue every 5 (five) minutes as needed for chest pain.  12/15/18   [name], MD  ondansetron (ZOFRAN ODT) 4 MG disintegrating tablet 4mg  ODT q4 hours prn nausea/vomit Patient taking differently: Take 4 mg by mouth every 4 (four) hours as needed for nausea or vomiting.  04/08/20   Milton Ferguson, MD  potassium chloride (KLOR-CON) 10 MEQ tablet Take 1 tablet (10 meq) 4 days/week (Tuesday, Thursday, Saturday, Sunday) and take 2 tablets (20 meq) 3 days/week (Monday, Wednesday, Friday) Patient not taking: Reported on 09/11/2020 01/04/20   [name], MD  ranolazine (RANEXA) 500 MG 12 hr tablet Take 1 tablet (500 mg total) by mouth 2 (two) times daily. 08/23/20   Alycia Rossetti, MD    Allergies    Lisinopril, Ranolazine, Vicodin [hydrocodone-acetaminophen], Amoxicillin, Demerol [meperidine], and Lodine [etodolac]  Review of Systems   Review of Systems  Constitutional: Negative for fever.  Respiratory: Negative for shortness of breath.   Cardiovascular: Negative for chest pain.  Gastrointestinal: Positive for abdominal pain and diarrhea. Negative  for vomiting.  Neurological: Negative for weakness and light-headedness.  All other systems reviewed and are negative.   Physical Exam Updated Vital Signs BP (!) 118/57    Pulse 67    Temp 98 F (36.7 C) (Oral)    Resp 19    Ht 5' (1.524 m)    Wt 93 kg    SpO2 96%    BMI 40.04 kg/m   Physical Exam Vitals and nursing note reviewed.  Constitutional:      Appearance: She is well-developed. She is obese.  HENT:     Head: Normocephalic and atraumatic.     Right Ear: External ear normal.     Left Ear: External ear normal.     Nose: Nose normal.  Eyes:     General:        Right eye: No discharge.        Left eye: No discharge.  Cardiovascular:     Rate and Rhythm: Normal rate and regular rhythm.     Heart sounds: Normal heart sounds.  Pulmonary:     Effort: Pulmonary effort is normal.     Breath sounds: Normal breath sounds.  Abdominal:     General: There is no distension.     Palpations: Abdomen is soft.     Tenderness: There is no abdominal tenderness.  Skin:    General: Skin is warm and dry.  Neurological:     Mental Status: She is alert.  Psychiatric:        Mood and Affect: Mood is not anxious.     ED Results / Procedures / Treatments   Labs (all labs ordered are listed, but only abnormal results are displayed) Labs Reviewed  COMPREHENSIVE METABOLIC PANEL - Abnormal; Notable for the following components:      Result Value   Creatinine, Ser 1.04 (*)    Albumin 3.3 (*)  AST 45 (*)    GFR, Estimated 56 (*)    All other components within normal limits  CBC WITH DIFFERENTIAL/PLATELET - Abnormal; Notable for the following components:   RBC 3.56 (*)    Hemoglobin 10.3 (*)    HCT 31.8 (*)    RDW 17.3 (*)    All other components within normal limits  BRAIN NATRIURETIC PEPTIDE - Abnormal; Notable for the following components:   B Natriuretic Peptide 1,278.0 (*)    All other components within normal limits  RESPIRATORY PANEL BY RT PCR (FLU A&B, COVID)  CBC  CBC    MAGNESIUM  TSH  COMPREHENSIVE METABOLIC PANEL    EKG EKG Interpretation  Date/Time:  Friday September 15 2020 20:09:11 EDT Ventricular Rate:  87 PR Interval:    QRS Duration: 126 QT Interval:  367 QTC Calculation: 442 R Axis:   37 Text Interpretation: Sinus rhythm Ventricular premature complex Nonspecific intraventricular conduction delay Repol abnrm, severe global ischemia (LM/MVD) similar to Sep 14 2020 Confirmed by Sherwood Gambler [phone]) on 09/15/2020 8:21:32 PM   Radiology DG Chest Portable 1 View  Result Date: 09/15/2020 CLINICAL DATA:  69 year old female with acute dyspnea. EXAM: PORTABLE CHEST 1 VIEW COMPARISON:  Chest radiograph dated 08/15/2009 and CT dated 01/14/2020. FINDINGS: There is cardiomegaly with vascular congestion and mild edema. Pneumonia is not excluded clinical correlation is recommended. No lobar consolidation, pleural effusion, pneumothorax. No acute osseous pathology. IMPRESSION: Cardiomegaly with findings of mild CHF.  Pneumonia is not excluded. Electronically Signed   By: Anner Crete M.D.   On: 09/15/2020 19:15    Procedures Procedures (including critical care time)  Medications Ordered in ED Medications  atorvastatin (LIPITOR) tablet 80 mg (80 mg Oral Not Given 09/16/20 0001)  metoprolol tartrate (LOPRESSOR) tablet 25 mg (25 mg Oral Not Given 09/16/20 0002)  ranolazine (RANEXA) 12 hr tablet 500 mg (500 mg Oral Not Given 09/16/20 0002)  levothyroxine (SYNTHROID) tablet 100 mcg (has no administration in time range)  ferrous sulfate EC tablet 325 mg (has no administration in time range)  gabapentin (NEURONTIN) capsule 300 mg (300 mg Oral Not Given 09/16/20 0003)  calcium carbonate (OS-CAL) tablet 600 mg (has no administration in time range)  loratadine (CLARITIN) tablet 10 mg (has no administration in time range)  sodium chloride flush (NS) 0.9 % injection 3 mL (3 mLs Intravenous Given by Other 09/16/20 0004)  sodium chloride flush (NS) 0.9 %  injection 3 mL (has no administration in time range)  0.9 %  sodium chloride infusion (has no administration in time range)  acetaminophen (TYLENOL) tablet 650 mg (has no administration in time range)  ondansetron (ZOFRAN) injection 4 mg (has no administration in time range)  furosemide (LASIX) injection 40 mg (has no administration in time range)  pantoprazole (PROTONIX) injection 40 mg (has no administration in time range)  sodium chloride 0.9 % bolus 500 mL (0 mLs Intravenous Stopped 09/15/20 1913)  albuterol (VENTOLIN HFA) 108 (90 Base) MCG/ACT inhaler 2 puff (2 puffs Inhalation Given 09/15/20 1912)  furosemide (LASIX) injection 40 mg (40 mg Intravenous Given 09/15/20 1912)    ED Course  I have reviewed the triage vital signs and the nursing notes.  Pertinent labs & imaging results that were available during my care of the patient were reviewed by me and considered in my medical decision making (see chart for details).    MDM Rules/Calculators/A&P  From a diarrhea/black stools perspective, her hemoglobin is stable.  Her abdominal exam is benign.  Given the numerous amount of stools she was endorsing, was given a small fluid bolus.  Either due to this or because she had laid flat at one point she developed an acute CHF exacerbation.  I doubt it was just the IVF given here but now despite sitting up straight and given time, she is still short of breath and now mildly hypoxic.  BNP is 1200.  She was given IV Lasix but still requiring O2 so she will need to be admitted. Final Clinical Impression(s) / ED Diagnoses Final diagnoses:  CHF exacerbation [facility])    Rx / DC Orders ED Discharge Orders    None       Sherwood Gambler, MD 09/16/20 [phone]

## 2020-09-15 NOTE — Discharge Instructions (Signed)
Recommending further evaluation and management in the ED for blood in stool with black diarrhea.  Hemoccult positive.  Cannot rule out GI bleed in urgent care setting.  Patient aware and in agreement.  Will travel by private vehicle to [facility] ED.

## 2020-09-15 NOTE — ED Provider Notes (Signed)
[facility]   [id number] 09/15/20 Arrival Time: 20  CC: Diarrhea  SUBJECTIVE:  Maria Fry is a 69 y.o. female who presents with complaint of three episodes of black runny diarrhea that began this morning.  Had cardioversion and is currently on eliquis.   Has not tried OTC medications.  Worse with eating.  Denies similar symptoms in the past.  Last BM this morning and "messed on herself" with black diarrhea.    Denies fever, chills, lightheadedness, dizziness, nausea, vomiting, chest pain, SOB, constipation, hematochezia, dysuria, difficulty urinating, increased frequency or urgency, flank pain, loss of bowel or bladder function, vaginal discharge, vaginal odor, vaginal bleeding, dyspareunia, pelvic pain.     Reports hx of abnormal colonoscopy. Had polyps removed.    No LMP recorded. Patient has had a hysterectomy.  ROS: As per HPI.  All other pertinent ROS negative.     Past Medical History:  Diagnosis Date  . CAD (coronary artery disease)    a. 06/07/15 NSTEMI/Cath: Dist LAD 100%, mid LAD 10%. Otw nl cors. Nl EF w/ inferoapical AK.  Marland Kitchen Chronic combined systolic (congestive) and diastolic (congestive) heart failure ([place]) 04/23/2016   a. 04/2016 Echo: EF 45%, Gr2 DD; b. 11/2019 Echo: EF 45-50%, mod LVH. Gr2 DD. Nl RV fxn. Sev dil LA.   Marland Kitchen Complication of anesthesia   . Essential hypertension   . Hip pain   . Hyperlipidemia   . Hypothyroidism   . MI (myocardial infarction) ([place]) 06/05/2015  . PONV (postoperative nausea and vomiting)   . Vitamin D deficiency    Past Surgical History:  Procedure Laterality Date  . ABDOMINAL HYSTERECTOMY    . CARDIAC CATHETERIZATION N/A 06/06/2015   Procedure: Left Heart Cath and Coronary Angiography;  Surgeon: Peter M Martinique, MD;  Location: [facility];  Service: Cardiovascular;  Laterality: N/A;  . CARDIOVERSION N/A 09/14/2020   Procedure: CARDIOVERSION;  Surgeon: Satira Sark, MD;  [facility];  Service:  Cardiovascular;  Laterality: N/A;  . CHOLECYSTECTOMY    . COLONOSCOPY    . COLONOSCOPY N/A 08/13/2017   Procedure: COLONOSCOPY;  Surgeon: Daneil Dolin, MD;  [facility];  Service: Endoscopy;  Laterality: N/A;  8:30 AM  . ESOPHAGOGASTRODUODENOSCOPY N/A 09/11/2016   Procedure: ESOPHAGOGASTRODUODENOSCOPY (EGD);  Surgeon: Daneil Dolin, MD;  [facility];  Service: Endoscopy;  Laterality: N/A;  7:45 am - moved to 10/11 @ 10:30  . Heel spurs    . MALONEY DILATION N/A 09/11/2016   Procedure: Venia Minks DILATION;  Surgeon: Daneil Dolin, MD;  [facility];  Service: Endoscopy;  Laterality: N/A;  . POLYPECTOMY  08/13/2017   Procedure: POLYPECTOMY;  Surgeon: Daneil Dolin, MD;  [facility];  Service: Endoscopy;;  colon  . SHOULDER ARTHROSCOPY    . TUBAL LIGATION     Allergies  Allergen Reactions  . Lisinopril Anaphylaxis  . Ranolazine Anaphylaxis    Dizzy, Reaction occurred with generic med  . Vicodin [Hydrocodone-Acetaminophen] Diarrhea and Nausea And Vomiting  . Amoxicillin Swelling  . Demerol [Meperidine] Nausea And Vomiting  . Lodine [Etodolac] Rash   No current facility-administered medications on file prior to encounter.   Current Outpatient Medications on File Prior to Encounter  Medication Sig Dispense Refill  . acetaminophen (TYLENOL) 500 MG tablet Take 500 mg by mouth every 6 (six) hours as needed for mild pain or moderate pain. Reported on 04/22/2016    . apixaban (ELIQUIS) 5 MG TABS  tablet Take 1 tablet (5 mg total) by mouth 2 (two) times daily. 60 tablet 11  . atorvastatin (LIPITOR) 80 MG tablet Take 1 tablet (80 mg total) by mouth at bedtime. 90 tablet 3  . calcium carbonate (CALCIUM 600) 600 MG TABS tablet Take 1 tablet (600 mg total) by mouth 2 (two) times daily with a meal. 60 tablet 6  . Cholecalciferol (VITAMIN D) 50 MCG (2000 UT) CAPS Take 1 tablet daily (Patient taking differently: Take 2,000 Units by mouth daily. ) 30  capsule 2  . ferrous sulfate 325 (65 FE) MG EC tablet Take 325 mg by mouth daily.    . furosemide (LASIX) 40 MG tablet Take 40 mg twice a day 4 days/week (Tuesday, Thursday, Saturday, Sunday) and take 40 mg  daily 3 days/week (Monday, Wednesday and Friday) (Patient taking differently: Take 40 mg by mouth See admin instructions. Take 40 mg by mouth once daily on Tuesday, Thursday, Saturday, Sunday and take 40 mg  Twice daily on Monday, Wednesday and Friday) 120 tablet 6  . gabapentin (NEURONTIN) 300 MG capsule Take 300 mg by mouth 2 (two) times daily.     Marland Kitchen KLOR-CON M15 15 MEQ tablet Take 1 tablet by mouth twice daily (Patient taking differently: Take 15 mEq by mouth 2 (two) times daily. ) 180 tablet 3  . levothyroxine (SYNTHROID) 100 MCG tablet TAKE 1 TABLET BY MOUTH ONCE DAILY BEFORE BREAKFAST (Patient taking differently: Take 100 mcg by mouth daily before breakfast. ) 90 tablet 2  . loratadine (CLARITIN) 10 MG tablet Take 10 mg by mouth daily.     . metoprolol tartrate (LOPRESSOR) 25 MG tablet Take 1 tablet (25 mg total) by mouth 2 (two) times daily. 180 tablet 3  . nitroGLYCERIN (NITROSTAT) 0.4 MG SL tablet DISSOLVE ONE TABLET UNDER THE TONGUE EVERY 5 MINUTES AS NEEDED FOR CHEST PAIN.  DO NOT EXCEED A TOTAL OF 3 DOSES IN 15 MINUTES (Patient taking differently: Place 0.4 mg under the tongue every 5 (five) minutes as needed for chest pain. ) 25 tablet 3  . ondansetron (ZOFRAN ODT) 4 MG disintegrating tablet 4mg  ODT q4 hours prn nausea/vomit (Patient taking differently: Take 4 mg by mouth every 4 (four) hours as needed for nausea or vomiting. ) 12 tablet 0  . potassium chloride (KLOR-CON) 10 MEQ tablet Take 1 tablet (10 meq) 4 days/week (Tuesday, Thursday, Saturday, Sunday) and take 2 tablets (20 meq) 3 days/week (Monday, Wednesday, Friday) (Patient not taking: Reported on 09/11/2020) 40 tablet 0  . ranolazine (RANEXA) 500 MG 12 hr tablet Take 1 tablet (500 mg total) by mouth 2 (two) times daily. 60  tablet 2   Social History   Socioeconomic History  . Marital status: Single    Spouse name: Not on file  . Number of children: Not on file  . Years of education: Not on file  . Highest education level: Not on file  Occupational History  . Not on file  Tobacco Use  . Smoking status: Former Smoker    Types: Cigarettes    Start date: 12/02/1977    Quit date: 12/02/2002    Years since quitting: 17.8  . Smokeless tobacco: Never Used  Vaping Use  . Vaping Use: Never used  Substance and Sexual Activity  . Alcohol use: No    Alcohol/week: 0.0 standard drinks  . Drug use: No  . Sexual activity: Yes  Other Topics Concern  . Not on file  Social History Narrative  . Not on  file   Social Determinants of Health   Financial Resource Strain: Low Risk   . Difficulty of Paying Living Expenses: Not very hard  Food Insecurity:   . Worried About Charity fundraiser in the Last Year: Not on file  . Ran Out of Food in the Last Year: Not on file  Transportation Needs:   . Lack of Transportation (Medical): Not on file  . Lack of Transportation (Non-Medical): Not on file  Physical Activity:   . Days of Exercise per Week: Not on file  . Minutes of Exercise per Session: Not on file  Stress:   . Feeling of Stress : Not on file  Social Connections:   . Frequency of Communication with Friends and Family: Not on file  . Frequency of Social Gatherings with Friends and Family: Not on file  . Attends Religious Services: Not on file  . Active Member of Clubs or Organizations: Not on file  . Attends Archivist Meetings: Not on file  . Marital Status: Not on file  Intimate Partner Violence:   . Fear of Current or Ex-Partner: Not on file  . Emotionally Abused: Not on file  . Physically Abused: Not on file  . Sexually Abused: Not on file   Family History  Problem Relation Age of Onset  . Heart failure Father        Deceased  . Heart attack Father        Deceased  . Stroke Sister         Deceased  . Heart failure Brother        Deceased  . Cancer Brother        Deceased  . Heart attack Brother        Deceased  . Colon cancer Neg Hx      OBJECTIVE:  Vitals:   09/15/20 1434  BP: (!) 143/91  Pulse: 79  Resp: 18  Temp: 98.5 F (36.9 C)  SpO2: 95%    General appearance: Alert; NAD HEENT: NCAT.  Oropharynx clear.  Lungs: clear to auscultation bilaterally without adventitious breath sounds Heart: regular rate and rhythm.   Abdomen: soft, non-distended; normal active bowel sounds; mild diffuse TTP over abdomen; nontender at McBurney's point; no guarding Rectal: Declines chaperone: External exam negative for hemorrhoid, fissure, or mass. Internal: No masses appreciated; hemoccult + Extremities: no edema; symmetrical with no gross deformities Skin: warm and dry Neurologic: normal gait Psychological: alert and cooperative; normal mood and affect  LABS: Results for orders placed or performed during the hospital encounter of 09/15/20 (from the past 24 hour(s))  POC Hemoccult Bld/Stl (1-Cd Office Dx)     Status: Abnormal   Collection Time: 09/15/20  3:29 PM  Result Value Ref Range   Card #1 Date     Fecal Occult Blood, POC Positive (A) Negative   ASSESSMENT & PLAN:  1. Black stools   2. Encounter for Hemoccult screening    Recommending further evaluation and management in the ED for blood in stool with black diarrhea.  Hemoccult positive.  Cannot rule out GI bleed in urgent care setting.  Patient aware and in agreement.  Will travel by private vehicle to [facility] ED.     Lestine Box, PA-C 09/15/20 1531

## 2020-09-15 NOTE — Telephone Encounter (Signed)
No fever, no nausea or vomiting, no hemorrhoids.  States that every time she tries to eat and food hits her stomach, stomach starts griping and runs right through her.  Suggested that she see her pcp or urgent care to be evaluated today.  She verbalized understanding & states she will call Dr. [name].

## 2020-09-16 ENCOUNTER — Inpatient Hospital Stay (HOSPITAL_COMMUNITY): Payer: PPO

## 2020-09-16 DIAGNOSIS — K921 Melena: Secondary | ICD-10-CM

## 2020-09-16 DIAGNOSIS — I5043 Acute on chronic combined systolic (congestive) and diastolic (congestive) heart failure: Secondary | ICD-10-CM

## 2020-09-16 LAB — MAGNESIUM: Magnesium: 2 mg/dL (ref 1.7–2.4)

## 2020-09-16 LAB — CBC
HCT: 32.8 % — ABNORMAL LOW (ref 36.0–46.0)
HCT: 30.9 % — ABNORMAL LOW (ref 36.0–46.0)
Hemoglobin: 10.3 g/dL — ABNORMAL LOW (ref 12.0–15.0)
Hemoglobin: 9.8 g/dL — ABNORMAL LOW (ref 12.0–15.0)
MCH: 28.7 pg (ref 26.0–34.0)
MCH: 28.6 pg (ref 26.0–34.0)
MCHC: 31.4 g/dL (ref 30.0–36.0)
MCHC: 31.7 g/dL (ref 30.0–36.0)
MCV: 91.4 fL (ref 80.0–100.0)
MCV: 90.1 fL (ref 80.0–100.0)
Platelets: 250 10*3/uL (ref 150–400)
Platelets: 243 10*3/uL (ref 150–400)
RBC: 3.59 MIL/uL — ABNORMAL LOW (ref 3.87–5.11)
RBC: 3.43 MIL/uL — ABNORMAL LOW (ref 3.87–5.11)
RDW: 17.4 % — ABNORMAL HIGH (ref 11.5–15.5)
RDW: 17.2 % — ABNORMAL HIGH (ref 11.5–15.5)
WBC: 7.1 10*3/uL (ref 4.0–10.5)
WBC: 7.4 10*3/uL (ref 4.0–10.5)
nRBC: 0 % (ref 0.0–0.2)
nRBC: 0 % (ref 0.0–0.2)

## 2020-09-16 LAB — COMPREHENSIVE METABOLIC PANEL
ALT: 31 U/L (ref 0–44)
AST: 33 U/L (ref 15–41)
Albumin: 3.2 g/dL — ABNORMAL LOW (ref 3.5–5.0)
Alkaline Phosphatase: 81 U/L (ref 38–126)
Anion gap: 10 (ref 5–15)
BUN: 14 mg/dL (ref 8–23)
CO2: 23 mmol/L (ref 22–32)
Calcium: 8.1 mg/dL — ABNORMAL LOW (ref 8.9–10.3)
Chloride: 104 mmol/L (ref 98–111)
Creatinine, Ser: 0.98 mg/dL (ref 0.44–1.00)
GFR, Estimated: 60 mL/min — ABNORMAL LOW (ref >60)
Glucose, Bld: 107 mg/dL — ABNORMAL HIGH (ref 70–99)
Potassium: 3.2 mmol/L — ABNORMAL LOW (ref 3.5–5.1)
Sodium: 137 mmol/L (ref 135–145)
Total Bilirubin: 0.9 mg/dL (ref 0.3–1.2)
Total Protein: 7 g/dL (ref 6.5–8.1)

## 2020-09-16 LAB — TSH: TSH: 3.395 u[IU]/mL (ref 0.350–4.500)

## 2020-09-16 LAB — TYPE AND SCREEN
ABO/RH(D): O POS
Antibody Screen: NEGATIVE

## 2020-09-16 LAB — POC OCCULT BLOOD, ED: Fecal Occult Bld: POSITIVE — AB

## 2020-09-16 LAB — ABO/RH: ABO/RH(D): O POS

## 2020-09-16 MED ORDER — MAGNESIUM SULFATE IN D5W 1-5 GM/100ML-% IV SOLN
1.0000 g | Freq: Once | INTRAVENOUS | Status: AC
  Administered 2020-09-16: 1 g via INTRAVENOUS
  Filled 2020-09-16: qty 100

## 2020-09-16 MED ORDER — LEVOTHYROXINE SODIUM 100 MCG PO TABS
100.0000 ug | ORAL_TABLET | Freq: Every day | ORAL | Status: DC
  Administered 2020-09-17: 100 ug via ORAL
  Filled 2020-09-16: qty 1

## 2020-09-16 MED ORDER — POTASSIUM CHLORIDE 10 MEQ/100ML IV SOLN
10.0000 meq | INTRAVENOUS | Status: AC
  Administered 2020-09-16 (×3): 10 meq via INTRAVENOUS
  Filled 2020-09-16 (×3): qty 100

## 2020-09-16 MED ORDER — PANTOPRAZOLE SODIUM 40 MG IV SOLR
40.0000 mg | Freq: Two times a day (BID) | INTRAVENOUS | Status: DC
  Administered 2020-09-16 (×2): 40 mg via INTRAVENOUS
  Filled 2020-09-16 (×3): qty 40

## 2020-09-16 MED ORDER — FUROSEMIDE 10 MG/ML IJ SOLN
40.0000 mg | Freq: Two times a day (BID) | INTRAMUSCULAR | Status: DC
  Administered 2020-09-16 – 2020-09-17 (×2): 40 mg via INTRAVENOUS
  Filled 2020-09-16 (×2): qty 4

## 2020-09-16 NOTE — Progress Notes (Signed)
Patient seen and examined.  Admitted after midnight secondary to abdominal pain, melanotic stools/diarrhea and shortness of breath.  Patient with recent cardioversion on 09/14/2020 and chronic use of Eliquis.  Denies chest pain, no nausea vomiting currently hemodynamically stable and just mildly short of breath.  Patient with fine crackles on auscultation, no wheezing, no using accessory muscles and good O2 sat on 2 L nasal cannula supplementation.  Reports significant improvement in her breathing and since ED arrival has not had any further episodes of melanotic stools.  Please refer to H&P written by Dr. Clearence Ped for further info/details on admission.  Plan: -Continue IV fluids for another 24 hours to further stabilize acute on chronic systolic heart failure. Continue to follow daily weights and strict I's and O's -Continue to wean oxygen supplementation to room air as tolerated Clear liquid diets we will continue the use of PPI -Follow electrolytes and replete as needed -Follow gastroenterology recommendations. -Continue holding anticoagulation therapy at this time.  Barton Dubois MD [phone]

## 2020-09-16 NOTE — ED Notes (Signed)
PT report given to [facility], verbalized complete understanding of Pt plan of care and current condition denies questions at this time. Pt resting on stretcher rails up x 2 call light in hand. Pt remains a/o x 4 skin warm dry intact. Pt denies needs or pain at this time. VSS NAD PT IV healthy and dressing remains clean and dry. Pt is hemodynamically stable and ready for transport via stretcher  To clean ready room.

## 2020-09-16 NOTE — Consult Note (Signed)
Consulting Provider: Dr. Clearence Ped  Primary Care Physician:  Alycia Rossetti, MD Primary Gastroenterologist:  Dr. Gala Romney  Date of Admission: 09/16/2020 Date of Consultation: 09/16/2020  Reason for Consultation:  GI bleed  HPI:  Maria Fry is a 69 y.o. female with a past medical history of CAD, hypothyroidism, combined systolic and diastolic heart failure, and A. fib chronically on Eliquis, who was admitted to [facility] early this morning after initially presenting with melena. Patient reports having cardioversion on 09/14/2020. She says she spent most of the day recovering from anesthesia. When she woke up from a nap, she had multiple episodes of loose stools. Describes this as sticky black stools. Does note some abdominal cramping as well as well as one episode of stool incontinence. Abdominal pain 6 out of 10. Patient is chronically on Eliquis. In the ER, patient was found to be in respiratory failure requiring 2 L of supplemental O2. She had chest x-ray findings of mild CHF. Hemoglobin stable at 10.3. Hemoccult positive.  Patient denies any abdominal pain for me today. No further episodes of melena besides the initial 3-4. Does note taking Goody powders daily for a long time. But states that she stopped this a few months ago when she started Eliquis. No history of PUD or H. pylori that she knows of. Last EGD by Dr.[name] for dysphagia and 2017 with successful dilation. 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  Past Medical History:  Diagnosis Date  . CAD (coronary artery disease)    a. 06/07/15 NSTEMI/Cath: Dist LAD 100%, mid LAD 10%. Otw nl cors. Nl EF w/ inferoapical AK.  Marland Kitchen Chronic combined systolic (congestive) and diastolic (congestive) heart failure ([place]) 04/23/2016   a. 04/2016 Echo: EF 45%, Gr2 DD; b. 11/2019 Echo: EF 45-50%, mod LVH. Gr2 DD. Nl RV fxn. Sev dil LA.   Marland Kitchen Complication of anesthesia   . Essential  hypertension   . Hip pain   . Hyperlipidemia   . Hypothyroidism   . MI (myocardial infarction) ([place]) 06/05/2015  . PONV (postoperative nausea and vomiting)   . Vitamin D deficiency     Past Surgical History:  Procedure Laterality Date  . ABDOMINAL HYSTERECTOMY    . CARDIAC CATHETERIZATION N/A 06/06/2015   Procedure: Left Heart Cath and Coronary Angiography;  Surgeon: Peter M Martinique, MD;  Location: [place] [facility];  Service: Cardiovascular;  Laterality: N/A;  . CARDIOVERSION N/A 09/14/2020   Procedure: CARDIOVERSION;  Surgeon: Satira Sark, MD;  Location: [facility];  Service: Cardiovascular;  Laterality: N/A;  . CHOLECYSTECTOMY    . COLONOSCOPY    . COLONOSCOPY N/A 08/13/2017   Procedure: COLONOSCOPY;  Surgeon: Daneil Dolin, MD;  Location: [facility];  Service: Endoscopy;  Laterality: N/A;  8:30 AM  . ESOPHAGOGASTRODUODENOSCOPY N/A 09/11/2016   Procedure: ESOPHAGOGASTRODUODENOSCOPY (EGD);  Surgeon: Daneil Dolin, MD;  Location: [facility];  Service: Endoscopy;  Laterality: N/A;  7:45 am - moved to 10/11 @ 10:30  . Heel spurs    . MALONEY DILATION N/A 09/11/2016   Procedure: Venia Minks DILATION;  Surgeon: Daneil Dolin, MD;  Location: [facility];  Service: Endoscopy;  Laterality: N/A;  . POLYPECTOMY  08/13/2017   Procedure: POLYPECTOMY;  Surgeon: Daneil Dolin, MD;  Location: [facility];  Service: Endoscopy;;  colon  . SHOULDER ARTHROSCOPY    . TUBAL LIGATION      Prior to Admission medications  Medication Sig Start Date End Date Taking? Authorizing Provider  acetaminophen (TYLENOL) 500 MG tablet Take 500 mg by mouth every 6 (six) hours as needed for mild pain or moderate pain. Reported on 04/22/2016   Yes [provider]  apixaban (ELIQUIS) 5 MG TABS tablet Take 1 tablet (5 mg total) by mouth 2 (two) times daily. 08/18/20  Yes Barrett, Evelene Croon, PA-C  atorvastatin (LIPITOR) 80 MG tablet Take 1 tablet (80 mg total) by mouth at bedtime. 01/15/20  Yes  Roxan Hockey, MD  calcium carbonate (CALCIUM 600) 600 MG TABS tablet Take 1 tablet (600 mg total) by mouth 2 (two) times daily with a meal. 08/23/20  Yes [place], [name], MD  Cholecalciferol (VITAMIN D) 50 MCG (2000 UT) CAPS Take 1 tablet daily Patient taking differently: Take 2,000 Units by mouth daily.  08/23/20  Yes [place], [name], MD  ferrous sulfate 325 (65 FE) MG EC tablet Take 325 mg by mouth daily.   Yes [provider]  furosemide (LASIX) 40 MG tablet Take 40 mg twice a day 4 days/week (Tuesday, Thursday, Saturday, Sunday) and take 40 mg  daily 3 days/week (Monday, Wednesday and Friday) Patient taking differently: Take 40 mg by mouth See admin instructions. Take 40 mg by mouth once daily on Tuesday, Thursday, Saturday, Sunday and take 40 mg  Twice daily on Monday, Wednesday and Friday 08/31/20  Yes Barrett, Evelene Croon, PA-C  gabapentin (NEURONTIN) 300 MG capsule Take 300 mg by mouth 2 (two) times daily.    Yes [provider]  KLOR-CON M15 15 MEQ tablet Take 1 tablet by mouth twice daily Patient taking differently: Take 15 mEq by mouth 2 (two) times daily.  03/08/20  Yes [name], MD  levothyroxine (SYNTHROID) 100 MCG tablet TAKE 1 TABLET BY MOUTH ONCE DAILY BEFORE BREAKFAST Patient taking differently: Take 100 mcg by mouth daily before breakfast.  06/14/20  Yes [place], Marella Chimes, MD  loratadine (CLARITIN) 10 MG tablet Take 10 mg by mouth daily.    Yes [provider]  metoprolol tartrate (LOPRESSOR) 25 MG tablet Take 1 tablet (25 mg total) by mouth 2 (two) times daily. 08/18/20 11/16/20 Yes Barrett, Evelene Croon, PA-C  ondansetron (ZOFRAN ODT) 4 MG disintegrating tablet 4mg  ODT q4 hours prn nausea/vomit Patient taking differently: Take 4 mg by mouth every 4 (four) hours as needed for nausea or vomiting.  04/08/20  Yes Milton Ferguson, MD  ranolazine (RANEXA) 500 MG 12 hr tablet Take 1 tablet (500 mg total) by mouth 2 (two) times daily. 08/23/20  Yes  [place], [name], MD  nitroGLYCERIN (NITROSTAT) 0.4 MG SL tablet DISSOLVE ONE TABLET UNDER THE TONGUE EVERY 5 MINUTES AS NEEDED FOR CHEST PAIN.  DO NOT EXCEED A TOTAL OF 3 DOSES IN 15 MINUTES Patient taking differently: Place 0.4 mg under the tongue every 5 (five) minutes as needed for chest pain.  12/15/18   [name], MD  potassium chloride (KLOR-CON) 10 MEQ tablet Take 1 tablet (10 meq) 4 days/week (Tuesday, Thursday, Saturday, Sunday) and take 2 tablets (20 meq) 3 days/week (Monday, Wednesday, Friday) Patient not taking: Reported on 09/11/2020 01/04/20   [name], MD    Current Facility-Administered Medications  Medication Dose Route Frequency Provider Last Rate Last Admin  . 0.9 %  sodium chloride infusion  250 mL Intravenous PRN Zierle-Ghosh, Asia B, DO      . acetaminophen (TYLENOL) tablet 650 mg  650 mg Oral Q4H PRN Zierle-Ghosh, Asia B, DO      .  atorvastatin (LIPITOR) tablet 80 mg  80 mg Oral QHS Zierle-Ghosh, Asia B, DO      . calcium carbonate (OS-CAL - dosed in mg of elemental calcium) tablet 500 mg of elemental calcium  1 tablet Oral BID WC Zierle-Ghosh, Asia B, DO   500 mg of elemental calcium at 09/16/20 1056  . ferrous sulfate tablet 325 mg  325 mg Oral Q breakfast Zierle-Ghosh, Asia B, DO   325 mg at 09/16/20 0950  . furosemide (LASIX) injection 40 mg  40 mg Intravenous Q12H Barton Dubois, MD      . gabapentin (NEURONTIN) capsule 300 mg  300 mg Oral BID Zierle-Ghosh, Asia B, DO   300 mg at 09/16/20 0950  . levothyroxine (SYNTHROID) tablet 100 mcg  100 mcg Oral QAC breakfast Zierle-Ghosh, Asia B, DO   100 mcg at 09/16/20 0950  . loratadine (CLARITIN) tablet 10 mg  10 mg Oral Daily Zierle-Ghosh, Asia B, DO   10 mg at 09/16/20 0950  . magnesium sulfate IVPB 1 g 100 mL  1 g Intravenous Once Barton Dubois, MD 100 mL/hr at 09/16/20 1056 1 g at 09/16/20 1056  . metoprolol tartrate (LOPRESSOR) tablet 25 mg  25 mg Oral BID Zierle-Ghosh, Asia B, DO   25 mg at  09/16/20 0950  . ondansetron (ZOFRAN) injection 4 mg  4 mg Intravenous Q6H PRN Zierle-Ghosh, Asia B, DO      . pantoprazole (PROTONIX) injection 40 mg  40 mg Intravenous Q12H Zierle-Ghosh, Asia B, DO   40 mg at 09/16/20 0950  . potassium chloride 10 mEq in 100 mL IVPB  10 mEq Intravenous Q1 Hr x 3 Barton Dubois, MD      . ranolazine (RANEXA) 12 hr tablet 500 mg  500 mg Oral BID Zierle-Ghosh, Asia B, DO   500 mg at 09/16/20 1058  . sodium chloride flush (NS) 0.9 % injection 3 mL  3 mL Intravenous Q12H Zierle-Ghosh, Asia B, DO   3 mL at 09/16/20 0956  . sodium chloride flush (NS) 0.9 % injection 3 mL  3 mL Intravenous PRN Zierle-Ghosh, Asia B, DO       Current Outpatient Medications  Medication Sig Dispense Refill  . acetaminophen (TYLENOL) 500 MG tablet Take 500 mg by mouth every 6 (six) hours as needed for mild pain or moderate pain. Reported on 04/22/2016    . apixaban (ELIQUIS) 5 MG TABS tablet Take 1 tablet (5 mg total) by mouth 2 (two) times daily. 60 tablet 11  . atorvastatin (LIPITOR) 80 MG tablet Take 1 tablet (80 mg total) by mouth at bedtime. 90 tablet 3  . calcium carbonate (CALCIUM 600) 600 MG TABS tablet Take 1 tablet (600 mg total) by mouth 2 (two) times daily with a meal. 60 tablet 6  . Cholecalciferol (VITAMIN D) 50 MCG (2000 UT) CAPS Take 1 tablet daily (Patient taking differently: Take 2,000 Units by mouth daily. ) 30 capsule 2  . ferrous sulfate 325 (65 FE) MG EC tablet Take 325 mg by mouth daily.    . furosemide (LASIX) 40 MG tablet Take 40 mg twice a day 4 days/week (Tuesday, Thursday, Saturday, Sunday) and take 40 mg  daily 3 days/week (Monday, Wednesday and Friday) (Patient taking differently: Take 40 mg by mouth See admin instructions. Take 40 mg by mouth once daily on Tuesday, Thursday, Saturday, Sunday and take 40 mg  Twice daily on Monday, Wednesday and Friday) 120 tablet 6  . gabapentin (NEURONTIN) 300 MG capsule Take 300 mg  by mouth 2 (two) times daily.     Marland Kitchen KLOR-CON  M15 15 MEQ tablet Take 1 tablet by mouth twice daily (Patient taking differently: Take 15 mEq by mouth 2 (two) times daily. ) 180 tablet 3  . levothyroxine (SYNTHROID) 100 MCG tablet TAKE 1 TABLET BY MOUTH ONCE DAILY BEFORE BREAKFAST (Patient taking differently: Take 100 mcg by mouth daily before breakfast. ) 90 tablet 2  . loratadine (CLARITIN) 10 MG tablet Take 10 mg by mouth daily.     . metoprolol tartrate (LOPRESSOR) 25 MG tablet Take 1 tablet (25 mg total) by mouth 2 (two) times daily. 180 tablet 3  . ondansetron (ZOFRAN ODT) 4 MG disintegrating tablet 4mg  ODT q4 hours prn nausea/vomit (Patient taking differently: Take 4 mg by mouth every 4 (four) hours as needed for nausea or vomiting. ) 12 tablet 0  . ranolazine (RANEXA) 500 MG 12 hr tablet Take 1 tablet (500 mg total) by mouth 2 (two) times daily. 60 tablet 2  . nitroGLYCERIN (NITROSTAT) 0.4 MG SL tablet DISSOLVE ONE TABLET UNDER THE TONGUE EVERY 5 MINUTES AS NEEDED FOR CHEST PAIN.  DO NOT EXCEED A TOTAL OF 3 DOSES IN 15 MINUTES (Patient taking differently: Place 0.4 mg under the tongue every 5 (five) minutes as needed for chest pain. ) 25 tablet 3  . potassium chloride (KLOR-CON) 10 MEQ tablet Take 1 tablet (10 meq) 4 days/week (Tuesday, Thursday, Saturday, Sunday) and take 2 tablets (20 meq) 3 days/week (Monday, Wednesday, Friday) (Patient not taking: Reported on 09/11/2020) 40 tablet 0    Allergies as of 09/15/2020 - Review Complete 09/15/2020  Allergen Reaction Noted  . Lisinopril Anaphylaxis 06/13/2015  . Ranolazine Anaphylaxis 09/04/2020  . Vicodin [hydrocodone-acetaminophen] Diarrhea and Nausea And Vomiting 04/25/2016  . Amoxicillin Swelling 06/01/2018  . Demerol [meperidine] Nausea And Vomiting 01/17/2013  . Lodine [etodolac] Rash 01/17/2013    Family History  Problem Relation Age of Onset  . Heart failure Father        Deceased  . Heart attack Father        Deceased  . Stroke Sister        Deceased  . Heart failure  Brother        Deceased  . Cancer Brother        Deceased  . Heart attack Brother        Deceased  . Colon cancer Neg Hx     Social History   Socioeconomic History  . Marital status: Single    Spouse name: Not on file  . Number of children: Not on file  . Years of education: Not on file  . Highest education level: Not on file  Occupational History  . Not on file  Tobacco Use  . Smoking status: Former Smoker    Types: Cigarettes    Start date: 12/02/1977    Quit date: 12/02/2002    Years since quitting: 17.8  . Smokeless tobacco: Never Used  Vaping Use  . Vaping Use: Never used  Substance and Sexual Activity  . Alcohol use: No    Alcohol/week: 0.0 standard drinks  . Drug use: No  . Sexual activity: Yes  Other Topics Concern  . Not on file  Social History Narrative  . Not on file   Social Determinants of Health   Financial Resource Strain: Low Risk   . Difficulty of Paying Living Expenses: Not very hard  Food Insecurity:   . Worried About Charity fundraiser in the  Last Year: Not on file  . Ran Out of Food in the Last Year: Not on file  Transportation Needs:   . Lack of Transportation (Medical): Not on file  . Lack of Transportation (Non-Medical): Not on file  Physical Activity:   . Days of Exercise per Week: Not on file  . Minutes of Exercise per Session: Not on file  Stress:   . Feeling of Stress : Not on file  Social Connections:   . Frequency of Communication with Friends and Family: Not on file  . Frequency of Social Gatherings with Friends and Family: Not on file  . Attends Religious Services: Not on file  . Active Member of Clubs or Organizations: Not on file  . Attends Archivist Meetings: Not on file  . Marital Status: Not on file  Intimate Partner Violence:   . Fear of Current or Ex-Partner: Not on file  . Emotionally Abused: Not on file  . Physically Abused: Not on file  . Sexually Abused: Not on file    Review of Systems: General:  Negative for anorexia, weight loss, fever, chills, fatigue, weakness. Eyes: Negative for vision changes.  ENT: Negative for hoarseness, difficulty swallowing , nasal congestion. CV: Negative for chest pain, angina, palpitations, dyspnea on exertion, peripheral edema.  Respiratory: Negative for dyspnea at rest, dyspnea on exertion, cough, sputum, wheezing.  GI: See history of present illness. GU:  Negative for dysuria, hematuria, urinary incontinence, urinary frequency, nocturnal urination.  MS: Negative for joint pain, low back pain.  Derm: Negative for rash or itching.  Neuro: Negative for weakness, abnormal sensation, seizure, frequent headaches, memory loss, confusion.  Psych: Negative for anxiety, depression, suicidal ideation, hallucinations.  Endo: Negative for unusual weight change.  Heme: Negative for bruising or bleeding. Allergy: Negative for rash or hives.  Physical Exam: Vital signs in last 24 hours: Temp:  [97.5 F (36.4 C)-98.5 F (36.9 C)] 98 F (36.7 C) (10/15 2242) Pulse Rate:  [62-114] 68 (10/16 0950) Resp:  [18-32] 22 (10/16 0730) BP: (107-164)/(51-110) 133/62 (10/16 0950) SpO2:  [87 %-100 %] 97 % (10/16 0730) Weight:  [93 kg] 93 kg (10/15 1552)   General:   Alert,  Well-developed, well-nourished, pleasant and cooperative in NAD Head:  Normocephalic and atraumatic. Eyes:  Sclera clear, no icterus.   Conjunctiva pink. Ears:  Normal auditory acuity. Nose:  No deformity, discharge,  or lesions. Mouth:  No deformity or lesions, dentition normal. Neck:  Supple; no masses or thyromegaly. Lungs:  Clear throughout to auscultation.   No wheezes, crackles, or rhonchi. No acute distress. Heart:  Regular rate and rhythm; no murmurs, clicks, rubs,  or gallops. Abdomen:  Soft, nontender and nondistended. No masses, hepatosplenomegaly or hernias noted. Normal bowel sounds, without guarding, and without rebound.   Rectal:  Deferred until time of colonoscopy.   Msk:   Symmetrical without gross deformities. Normal posture. Pulses:  Normal pulses noted. Extremities:  Without clubbing or edema. Neurologic:  Alert and  oriented x4;  grossly normal neurologically. Skin:  Intact without significant lesions or rashes. Cervical Nodes:  No significant cervical adenopathy. Psych:  Alert and cooperative. Normal mood and affect.  Intake/Output from previous day: 10/15 0701 - 10/16 0700 In: 500 [IV Piggyback:500] Out: -  Intake/Output this shift: No intake/output data recorded.  Lab Results: Recent Labs    09/15/20 1626 09/16/20 0420  WBC 6.8 7.1  HGB 10.3* 10.3*  HCT 31.8* 32.8*  PLT 260 250   BMET Recent Labs  09/15/20 1626 09/16/20 0420  NA 138 137  K 3.8 3.2*  CL 102 104  CO2 23 23  GLUCOSE 99 107*  BUN 16 14  CREATININE 1.04* 0.98  CALCIUM 8.9 8.1*   LFT Recent Labs    09/15/20 1626 09/16/20 0420  PROT 7.5 7.0  ALBUMIN 3.3* 3.2*  AST 45* 33  ALT 35 31  ALKPHOS 88 81  BILITOT 0.7 0.9   PT/INR No results for input(s): LABPROT, INR in the last 72 hours. Hepatitis Panel No results for input(s): HEPBSAG, HCVAB, HEPAIGM, HEPBIGM in the last 72 hours. C-Diff No results for input(s): CDIFFTOX in the last 72 hours.  Studies/Results: DG CHEST PORT 1 VIEW  Result Date: 09/16/2020 CLINICAL DATA:  Diarrhea, CHF exacerbation EXAM: PORTABLE CHEST 1 VIEW COMPARISON:  09/15/2020 FINDINGS: Cardiomegaly with pulmonary vascular congestion. No frank interstitial edema, improved. No pleural effusion or pneumothorax. IMPRESSION: Cardiomegaly with pulmonary vascular congestion. No frank interstitial edema, improved. Electronically Signed   By: Julian Hy M.D.   On: 09/16/2020 05:17   DG Chest Portable 1 View  Result Date: 09/15/2020 CLINICAL DATA:  69 year old female with acute dyspnea. EXAM: PORTABLE CHEST 1 VIEW COMPARISON:  Chest radiograph dated 08/15/2009 and CT dated 01/14/2020. FINDINGS: There is cardiomegaly with vascular  congestion and mild edema. Pneumonia is not excluded clinical correlation is recommended. No lobar consolidation, pleural effusion, pneumothorax. No acute osseous pathology. IMPRESSION: Cardiomegaly with findings of mild CHF.  Pneumonia is not excluded. Electronically Signed   By: Anner Crete M.D.   On: 09/15/2020 19:15    Impression: *Acute GI bleeding-source likely upper given melena *Acute blood loss anemia due to above *History of NSAID use *Chronic anticoagulation with Eliquis  Plan: Etiology of patient's upper GI bleeding unclear. Differential includes gastritis, peptic ulcer disease, Dieulafoy lesion, AVMs, malignancy, or other.  Patient needs further evaluation with upper endoscopy. Discussed with hospitalist. We would like to maximize patient from a respiratory standpoint. Further diuresis today. We will plan on EGD tomorrow morning. Clear liquids okay today, n.p.o. after midnight. Continue on IV Protonix for now. Hold Eliquis pending endoscopic evaluation.  The risks including infection, bleed, or perforation as well as benefits, limitations, alternatives and imponderables have been reviewed with the patient. Potential for esophageal dilation, biopsy, etc. have also been reviewed.  Questions have been answered. All parties agreeable.  Thank you for letting us take part in this patient's care.   LOS: 1 day     09/16/2020, 11:17 AM

## 2020-09-16 NOTE — TOC Initial Note (Signed)
Transition of Care [facility]) - Initial/Assessment Note    Patient Details  Name: Maria Fry MRN: [id number] Date of Birth: 03-17-1951  Transition of Care [facility]) CM/SW Contact:    Natasha Bence, LCSW Phone Number: 09/16/2020, 5:35 PM  Clinical Narrative:                 CSW contacted patient's for CHF consult and SNF assessment. Patient's daughter reported that patient does monitor her fluid intake and weight. In addition to that, she has begun to follow a heart healthy diet. Patient's daughter Enid Derry reported that patient lives with her other daughter and patient's spouse. Ms. Rutherford Guys reported that the family is willing to consider [facility] and SNF, but would have to discuss it as a family before referral and pending pt eval. CSW to follow.  Expected Discharge Plan: Skilled Nursing Facility Barriers to Discharge: Continued Medical Work up   Patient Goals and CMS Choice     Choice offered to / list presented to : Patient, Adult Children  Expected Discharge Plan and Services Expected Discharge Plan: [place]       Living arrangements for the past 2 months: Single Family Home                                      Prior Living Arrangements/Services Living arrangements for the past 2 months: Single Family Home Lives with:: Self, Adult Children, Spouse Patient language and need for interpreter reviewed:: Yes Do you feel safe going back to the place where you live?: Yes      Need for Family Participation in Patient Care: Yes (Comment) Care giver support system in place?: Yes (comment) Current home services: DME Kasandra Knudsen) Criminal Activity/Legal Involvement Pertinent to Current Situation/Hospitalization: No - Comment as needed  Activities of Daily Living Home Assistive Devices/Equipment: Cane (specify quad or straight), Dentures (specify type) ADL Screening (condition at time of admission) Patient's cognitive ability adequate to safely complete daily  activities?: Yes Is the patient deaf or have difficulty hearing?: No Does the patient have difficulty seeing, even when wearing glasses/contacts?: No Does the patient have difficulty concentrating, remembering, or making decisions?: No Patient able to express need for assistance with ADLs?: Yes Does the patient have difficulty dressing or bathing?: No Independently performs ADLs?: Yes (appropriate for developmental age) Does the patient have difficulty walking or climbing stairs?: No Weakness of Legs: None Weakness of Arms/Hands: None  Permission Sought/Granted Permission sought to share information with : Family Supports Permission granted to share information with : Yes, Verbal Permission Granted  Share Information with NAME: Enid Derry  Permission granted to share info w AGENCY: TBD  Permission granted to share info w Relationship: Daughter  Permission granted to share info w Contact Information: [phone]  Emotional Assessment       Orientation: : Oriented to Self, Oriented to Place, Oriented to  Time, Oriented to Situation Alcohol / Substance Use: Not Applicable Psych Involvement: No (comment)  Admission diagnosis:  CHF exacerbation ([place]) [I50.9] Patient Active Problem List   Diagnosis Date Noted  . CHF exacerbation ([place]) 09/15/2020  . Atrial fibrillation ([place]) 08/23/2020  . Chest pain 01/14/2020  . Stable angina ([place]) 01/14/2020  . Abnormal nuclear stress test 12/08/2019  . Hypokalemia 11/28/2019  . Anemia 11/28/2019  . Hyperlipidemia 11/27/2019  . Peripheral edema 05/18/2019  . OA (osteoarthritis) of knee 05/18/2019  . DDD (degenerative disc disease),  lumbar 05/18/2019  . Obesity 12/26/2017  . Seasonal allergies 08/19/2016  . Glucose intolerance (impaired glucose tolerance) 05/14/2016  . Chronic hip pain 05/14/2016  . Chronic combined systolic and diastolic heart failure ([place]) 05/14/2016  . Hypothyroidism following radioiodine therapy 11/30/2015  . CAD  (coronary artery disease)   . NSTEMI (non-ST elevated myocardial infarction) ([place])   . Hypertension 06/05/2015   PCP:  Alycia Rossetti, MD Pharmacy:   [facility] [address] [place] - [place] [address] [place] [address] [place] [address] Phone: [phone] Fax: [phone]  [facility] [place], [place] - [address] [place] [address] Phone: [phone] Fax: [phone]     Social Determinants of Health (SDOH) Interventions    Readmission Risk Interventions No flowsheet data found.

## 2020-09-16 NOTE — ED Notes (Signed)
Fist attempt to call report to [phone] unsuccessful, RN to call back upon bed approval.

## 2020-09-16 NOTE — H&P (View-Only) (Signed)
Consulting Provider: Dr. Clearence Ped  Primary Care Physician:  Alycia Rossetti, MD Primary Gastroenterologist:  Dr. Gala Romney  Date of Admission: 09/16/2020 Date of Consultation: 09/16/2020  Reason for Consultation:  GI bleed  HPI:  Maria Fry is a 69 y.o. female with a past medical history of CAD, hypothyroidism, combined systolic and diastolic heart failure, and A. fib chronically on Eliquis, who was admitted to [facility] early this morning after initially presenting with melena. Patient reports having cardioversion on 09/14/2020. She says she spent most of the day recovering from anesthesia. When she woke up from a nap, she had multiple episodes of loose stools. Describes this as sticky black stools. Does note some abdominal cramping as well as well as one episode of stool incontinence. Abdominal pain 6 out of 10. Patient is chronically on Eliquis. In the ER, patient was found to be in respiratory failure requiring 2 L of supplemental O2. She had chest x-ray findings of mild CHF. Hemoglobin stable at 10.3. Hemoccult positive.  Patient denies any abdominal pain for me today. No further episodes of melena besides the initial 3-4. Does note taking Goody powders daily for a long time. But states that she stopped this a few months ago when she started Eliquis. No history of PUD or H. pylori that she knows of. Last EGD by Dr.[name] for dysphagia and 2017 with successful dilation. 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  Past Medical History:  Diagnosis Date  . CAD (coronary artery disease)    a. 06/07/15 NSTEMI/Cath: Dist LAD 100%, mid LAD 10%. Otw nl cors. Nl EF w/ inferoapical AK.  Marland Kitchen Chronic combined systolic (congestive) and diastolic (congestive) heart failure ([place]) 04/23/2016   a. 04/2016 Echo: EF 45%, Gr2 DD; b. 11/2019 Echo: EF 45-50%, mod LVH. Gr2 DD. Nl RV fxn. Sev dil LA.   Marland Kitchen Complication of anesthesia   . Essential  hypertension   . Hip pain   . Hyperlipidemia   . Hypothyroidism   . MI (myocardial infarction) ([place]) 06/05/2015  . PONV (postoperative nausea and vomiting)   . Vitamin D deficiency     Past Surgical History:  Procedure Laterality Date  . ABDOMINAL HYSTERECTOMY    . CARDIAC CATHETERIZATION N/A 06/06/2015   Procedure: Left Heart Cath and Coronary Angiography;  Surgeon: Peter M Martinique, MD;  Location: [facility];  Service: Cardiovascular;  Laterality: N/A;  . CARDIOVERSION N/A 09/14/2020   Procedure: CARDIOVERSION;  Surgeon: Satira Sark, MD;  Location: [facility];  Service: Cardiovascular;  Laterality: N/A;  . CHOLECYSTECTOMY    . COLONOSCOPY    . COLONOSCOPY N/A 08/13/2017   Procedure: COLONOSCOPY;  Surgeon: Daneil Dolin, MD;  Location: [facility];  Service: Endoscopy;  Laterality: N/A;  8:30 AM  . ESOPHAGOGASTRODUODENOSCOPY N/A 09/11/2016   Procedure: ESOPHAGOGASTRODUODENOSCOPY (EGD);  Surgeon: Daneil Dolin, MD;  Location: [facility];  Service: Endoscopy;  Laterality: N/A;  7:45 am - moved to 10/11 @ 10:30  . Heel spurs    . MALONEY DILATION N/A 09/11/2016   Procedure: Venia Minks DILATION;  Surgeon: Daneil Dolin, MD;  Location: [facility];  Service: Endoscopy;  Laterality: N/A;  . POLYPECTOMY  08/13/2017   Procedure: POLYPECTOMY;  Surgeon: Daneil Dolin, MD;  Location: [facility];  Service: Endoscopy;;  colon  . SHOULDER ARTHROSCOPY    . TUBAL LIGATION      Prior to Admission medications  Medication Sig Start Date End Date Taking? Authorizing Provider  acetaminophen (TYLENOL) 500 MG tablet Take 500 mg by mouth every 6 (six) hours as needed for mild pain or moderate pain. Reported on 04/22/2016   Yes [provider]  apixaban (ELIQUIS) 5 MG TABS tablet Take 1 tablet (5 mg total) by mouth 2 (two) times daily. 08/18/20  Yes Barrett, Evelene Croon, PA-C  atorvastatin (LIPITOR) 80 MG tablet Take 1 tablet (80 mg total) by mouth at bedtime. 01/15/20  Yes  Roxan Hockey, MD  calcium carbonate (CALCIUM 600) 600 MG TABS tablet Take 1 tablet (600 mg total) by mouth 2 (two) times daily with a meal. 08/23/20  Yes Franklin, [name], MD  Cholecalciferol (VITAMIN D) 50 MCG (2000 UT) CAPS Take 1 tablet daily Patient taking differently: Take 2,000 Units by mouth daily.  08/23/20  Yes Hayden, [name], MD  ferrous sulfate 325 (65 FE) MG EC tablet Take 325 mg by mouth daily.   Yes [provider]  furosemide (LASIX) 40 MG tablet Take 40 mg twice a day 4 days/week (Tuesday, Thursday, Saturday, Sunday) and take 40 mg  daily 3 days/week (Monday, Wednesday and Friday) Patient taking differently: Take 40 mg by mouth See admin instructions. Take 40 mg by mouth once daily on Tuesday, Thursday, Saturday, Sunday and take 40 mg  Twice daily on Monday, Wednesday and Friday 08/31/20  Yes Barrett, Evelene Croon, PA-C  gabapentin (NEURONTIN) 300 MG capsule Take 300 mg by mouth 2 (two) times daily.    Yes [provider]  KLOR-CON M15 15 MEQ tablet Take 1 tablet by mouth twice daily Patient taking differently: Take 15 mEq by mouth 2 (two) times daily.  03/08/20  Yes [name], MD  levothyroxine (SYNTHROID) 100 MCG tablet TAKE 1 TABLET BY MOUTH ONCE DAILY BEFORE BREAKFAST Patient taking differently: Take 100 mcg by mouth daily before breakfast.  06/14/20  Yes Nida, Marella Chimes, MD  loratadine (CLARITIN) 10 MG tablet Take 10 mg by mouth daily.    Yes [provider]  metoprolol tartrate (LOPRESSOR) 25 MG tablet Take 1 tablet (25 mg total) by mouth 2 (two) times daily. 08/18/20 11/16/20 Yes Barrett, Evelene Croon, PA-C  ondansetron (ZOFRAN ODT) 4 MG disintegrating tablet 4mg  ODT q4 hours prn nausea/vomit Patient taking differently: Take 4 mg by mouth every 4 (four) hours as needed for nausea or vomiting.  04/08/20  Yes Milton Ferguson, MD  ranolazine (RANEXA) 500 MG 12 hr tablet Take 1 tablet (500 mg total) by mouth 2 (two) times daily. 08/23/20  Yes  [place], [name], MD  nitroGLYCERIN (NITROSTAT) 0.4 MG SL tablet DISSOLVE ONE TABLET UNDER THE TONGUE EVERY 5 MINUTES AS NEEDED FOR CHEST PAIN.  DO NOT EXCEED A TOTAL OF 3 DOSES IN 15 MINUTES Patient taking differently: Place 0.4 mg under the tongue every 5 (five) minutes as needed for chest pain.  12/15/18   [name], MD  potassium chloride (KLOR-CON) 10 MEQ tablet Take 1 tablet (10 meq) 4 days/week (Tuesday, Thursday, Saturday, Sunday) and take 2 tablets (20 meq) 3 days/week (Monday, Wednesday, Friday) Patient not taking: Reported on 09/11/2020 01/04/20   [name], MD    Current Facility-Administered Medications  Medication Dose Route Frequency Provider Last Rate Last Admin  . 0.9 %  sodium chloride infusion  250 mL Intravenous PRN Zierle-Ghosh, Asia B, DO      . acetaminophen (TYLENOL) tablet 650 mg  650 mg Oral Q4H PRN Zierle-Ghosh, Asia B, DO      .  atorvastatin (LIPITOR) tablet 80 mg  80 mg Oral QHS Zierle-Ghosh, Asia B, DO      . calcium carbonate (OS-CAL - dosed in mg of elemental calcium) tablet 500 mg of elemental calcium  1 tablet Oral BID WC Zierle-Ghosh, Asia B, DO   500 mg of elemental calcium at 09/16/20 1056  . ferrous sulfate tablet 325 mg  325 mg Oral Q breakfast Zierle-Ghosh, Asia B, DO   325 mg at 09/16/20 0950  . furosemide (LASIX) injection 40 mg  40 mg Intravenous Q12H Barton Dubois, MD      . gabapentin (NEURONTIN) capsule 300 mg  300 mg Oral BID Zierle-Ghosh, Asia B, DO   300 mg at 09/16/20 0950  . levothyroxine (SYNTHROID) tablet 100 mcg  100 mcg Oral QAC breakfast Zierle-Ghosh, Asia B, DO   100 mcg at 09/16/20 0950  . loratadine (CLARITIN) tablet 10 mg  10 mg Oral Daily Zierle-Ghosh, Asia B, DO   10 mg at 09/16/20 0950  . magnesium sulfate IVPB 1 g 100 mL  1 g Intravenous Once Barton Dubois, MD 100 mL/hr at 09/16/20 1056 1 g at 09/16/20 1056  . metoprolol tartrate (LOPRESSOR) tablet 25 mg  25 mg Oral BID Zierle-Ghosh, Asia B, DO   25 mg at  09/16/20 0950  . ondansetron (ZOFRAN) injection 4 mg  4 mg Intravenous Q6H PRN Zierle-Ghosh, Asia B, DO      . pantoprazole (PROTONIX) injection 40 mg  40 mg Intravenous Q12H Zierle-Ghosh, Asia B, DO   40 mg at 09/16/20 0950  . potassium chloride 10 mEq in 100 mL IVPB  10 mEq Intravenous Q1 Hr x 3 Barton Dubois, MD      . ranolazine (RANEXA) 12 hr tablet 500 mg  500 mg Oral BID Zierle-Ghosh, Asia B, DO   500 mg at 09/16/20 1058  . sodium chloride flush (NS) 0.9 % injection 3 mL  3 mL Intravenous Q12H Zierle-Ghosh, Asia B, DO   3 mL at 09/16/20 0956  . sodium chloride flush (NS) 0.9 % injection 3 mL  3 mL Intravenous PRN Zierle-Ghosh, Asia B, DO       Current Outpatient Medications  Medication Sig Dispense Refill  . acetaminophen (TYLENOL) 500 MG tablet Take 500 mg by mouth every 6 (six) hours as needed for mild pain or moderate pain. Reported on 04/22/2016    . apixaban (ELIQUIS) 5 MG TABS tablet Take 1 tablet (5 mg total) by mouth 2 (two) times daily. 60 tablet 11  . atorvastatin (LIPITOR) 80 MG tablet Take 1 tablet (80 mg total) by mouth at bedtime. 90 tablet 3  . calcium carbonate (CALCIUM 600) 600 MG TABS tablet Take 1 tablet (600 mg total) by mouth 2 (two) times daily with a meal. 60 tablet 6  . Cholecalciferol (VITAMIN D) 50 MCG (2000 UT) CAPS Take 1 tablet daily (Patient taking differently: Take 2,000 Units by mouth daily. ) 30 capsule 2  . ferrous sulfate 325 (65 FE) MG EC tablet Take 325 mg by mouth daily.    . furosemide (LASIX) 40 MG tablet Take 40 mg twice a day 4 days/week (Tuesday, Thursday, Saturday, Sunday) and take 40 mg  daily 3 days/week (Monday, Wednesday and Friday) (Patient taking differently: Take 40 mg by mouth See admin instructions. Take 40 mg by mouth once daily on Tuesday, Thursday, Saturday, Sunday and take 40 mg  Twice daily on Monday, Wednesday and Friday) 120 tablet 6  . gabapentin (NEURONTIN) 300 MG capsule Take 300 mg  by mouth 2 (two) times daily.     Marland Kitchen KLOR-CON  M15 15 MEQ tablet Take 1 tablet by mouth twice daily (Patient taking differently: Take 15 mEq by mouth 2 (two) times daily. ) 180 tablet 3  . levothyroxine (SYNTHROID) 100 MCG tablet TAKE 1 TABLET BY MOUTH ONCE DAILY BEFORE BREAKFAST (Patient taking differently: Take 100 mcg by mouth daily before breakfast. ) 90 tablet 2  . loratadine (CLARITIN) 10 MG tablet Take 10 mg by mouth daily.     . metoprolol tartrate (LOPRESSOR) 25 MG tablet Take 1 tablet (25 mg total) by mouth 2 (two) times daily. 180 tablet 3  . ondansetron (ZOFRAN ODT) 4 MG disintegrating tablet 4mg  ODT q4 hours prn nausea/vomit (Patient taking differently: Take 4 mg by mouth every 4 (four) hours as needed for nausea or vomiting. ) 12 tablet 0  . ranolazine (RANEXA) 500 MG 12 hr tablet Take 1 tablet (500 mg total) by mouth 2 (two) times daily. 60 tablet 2  . nitroGLYCERIN (NITROSTAT) 0.4 MG SL tablet DISSOLVE ONE TABLET UNDER THE TONGUE EVERY 5 MINUTES AS NEEDED FOR CHEST PAIN.  DO NOT EXCEED A TOTAL OF 3 DOSES IN 15 MINUTES (Patient taking differently: Place 0.4 mg under the tongue every 5 (five) minutes as needed for chest pain. ) 25 tablet 3  . potassium chloride (KLOR-CON) 10 MEQ tablet Take 1 tablet (10 meq) 4 days/week (Tuesday, Thursday, Saturday, Sunday) and take 2 tablets (20 meq) 3 days/week (Monday, Wednesday, Friday) (Patient not taking: Reported on 09/11/2020) 40 tablet 0    Allergies as of 09/15/2020 - Review Complete 09/15/2020  Allergen Reaction Noted  . Lisinopril Anaphylaxis 06/13/2015  . Ranolazine Anaphylaxis 09/04/2020  . Vicodin [hydrocodone-acetaminophen] Diarrhea and Nausea And Vomiting 04/25/2016  . Amoxicillin Swelling 06/01/2018  . Demerol [meperidine] Nausea And Vomiting 01/17/2013  . Lodine [etodolac] Rash 01/17/2013    Family History  Problem Relation Age of Onset  . Heart failure Father        Deceased  . Heart attack Father        Deceased  . Stroke Sister        Deceased  . Heart failure  Brother        Deceased  . Cancer Brother        Deceased  . Heart attack Brother        Deceased  . Colon cancer Neg Hx     Social History   Socioeconomic History  . Marital status: Single    Spouse name: Not on file  . Number of children: Not on file  . Years of education: Not on file  . Highest education level: Not on file  Occupational History  . Not on file  Tobacco Use  . Smoking status: Former Smoker    Types: Cigarettes    Start date: 12/02/1977    Quit date: 12/02/2002    Years since quitting: 17.8  . Smokeless tobacco: Never Used  Vaping Use  . Vaping Use: Never used  Substance and Sexual Activity  . Alcohol use: No    Alcohol/week: 0.0 standard drinks  . Drug use: No  . Sexual activity: Yes  Other Topics Concern  . Not on file  Social History Narrative  . Not on file   Social Determinants of Health   Financial Resource Strain: Low Risk   . Difficulty of Paying Living Expenses: Not very hard  Food Insecurity:   . Worried About Charity fundraiser in the  Last Year: Not on file  . Ran Out of Food in the Last Year: Not on file  Transportation Needs:   . Lack of Transportation (Medical): Not on file  . Lack of Transportation (Non-Medical): Not on file  Physical Activity:   . Days of Exercise per Week: Not on file  . Minutes of Exercise per Session: Not on file  Stress:   . Feeling of Stress : Not on file  Social Connections:   . Frequency of Communication with Friends and Family: Not on file  . Frequency of Social Gatherings with Friends and Family: Not on file  . Attends Religious Services: Not on file  . Active Member of Clubs or Organizations: Not on file  . Attends Archivist Meetings: Not on file  . Marital Status: Not on file  Intimate Partner Violence:   . Fear of Current or Ex-Partner: Not on file  . Emotionally Abused: Not on file  . Physically Abused: Not on file  . Sexually Abused: Not on file    Review of Systems: General:  Negative for anorexia, weight loss, fever, chills, fatigue, weakness. Eyes: Negative for vision changes.  ENT: Negative for hoarseness, difficulty swallowing , nasal congestion. CV: Negative for chest pain, angina, palpitations, dyspnea on exertion, peripheral edema.  Respiratory: Negative for dyspnea at rest, dyspnea on exertion, cough, sputum, wheezing.  GI: See history of present illness. GU:  Negative for dysuria, hematuria, urinary incontinence, urinary frequency, nocturnal urination.  MS: Negative for joint pain, low back pain.  Derm: Negative for rash or itching.  Neuro: Negative for weakness, abnormal sensation, seizure, frequent headaches, memory loss, confusion.  Psych: Negative for anxiety, depression, suicidal ideation, hallucinations.  Endo: Negative for unusual weight change.  Heme: Negative for bruising or bleeding. Allergy: Negative for rash or hives.  Physical Exam: Vital signs in last 24 hours: Temp:  [97.5 F (36.4 C)-98.5 F (36.9 C)] 98 F (36.7 C) (10/15 2242) Pulse Rate:  [62-114] 68 (10/16 0950) Resp:  [18-32] 22 (10/16 0730) BP: (107-164)/(51-110) 133/62 (10/16 0950) SpO2:  [87 %-100 %] 97 % (10/16 0730) Weight:  [93 kg] 93 kg (10/15 1552)   General:   Alert,  Well-developed, well-nourished, pleasant and cooperative in NAD Head:  Normocephalic and atraumatic. Eyes:  Sclera clear, no icterus.   Conjunctiva pink. Ears:  Normal auditory acuity. Nose:  No deformity, discharge,  or lesions. Mouth:  No deformity or lesions, dentition normal. Neck:  Supple; no masses or thyromegaly. Lungs:  Clear throughout to auscultation.   No wheezes, crackles, or rhonchi. No acute distress. Heart:  Regular rate and rhythm; no murmurs, clicks, rubs,  or gallops. Abdomen:  Soft, nontender and nondistended. No masses, hepatosplenomegaly or hernias noted. Normal bowel sounds, without guarding, and without rebound.   Rectal:  Deferred until time of colonoscopy.   Msk:   Symmetrical without gross deformities. Normal posture. Pulses:  Normal pulses noted. Extremities:  Without clubbing or edema. Neurologic:  Alert and  oriented x4;  grossly normal neurologically. Skin:  Intact without significant lesions or rashes. Cervical Nodes:  No significant cervical adenopathy. Psych:  Alert and cooperative. Normal mood and affect.  Intake/Output from previous day: 10/15 0701 - 10/16 0700 In: 500 [IV Piggyback:500] Out: -  Intake/Output this shift: No intake/output data recorded.  Lab Results: Recent Labs    09/15/20 1626 09/16/20 0420  WBC 6.8 7.1  HGB 10.3* 10.3*  HCT 31.8* 32.8*  PLT 260 250   BMET Recent Labs  09/15/20 1626 09/16/20 0420  NA 138 137  K 3.8 3.2*  CL 102 104  CO2 23 23  GLUCOSE 99 107*  BUN 16 14  CREATININE 1.04* 0.98  CALCIUM 8.9 8.1*   LFT Recent Labs    09/15/20 1626 09/16/20 0420  PROT 7.5 7.0  ALBUMIN 3.3* 3.2*  AST 45* 33  ALT 35 31  ALKPHOS 88 81  BILITOT 0.7 0.9   PT/INR No results for input(s): LABPROT, INR in the last 72 hours. Hepatitis Panel No results for input(s): HEPBSAG, HCVAB, HEPAIGM, HEPBIGM in the last 72 hours. C-Diff No results for input(s): CDIFFTOX in the last 72 hours.  Studies/Results: DG CHEST PORT 1 VIEW  Result Date: 09/16/2020 CLINICAL DATA:  Diarrhea, CHF exacerbation EXAM: PORTABLE CHEST 1 VIEW COMPARISON:  09/15/2020 FINDINGS: Cardiomegaly with pulmonary vascular congestion. No frank interstitial edema, improved. No pleural effusion or pneumothorax. IMPRESSION: Cardiomegaly with pulmonary vascular congestion. No frank interstitial edema, improved. Electronically Signed   By: Julian Hy M.D.   On: 09/16/2020 05:17   DG Chest Portable 1 View  Result Date: 09/15/2020 CLINICAL DATA:  69 year old female with acute dyspnea. EXAM: PORTABLE CHEST 1 VIEW COMPARISON:  Chest radiograph dated 08/15/2009 and CT dated 01/14/2020. FINDINGS: There is cardiomegaly with vascular  congestion and mild edema. Pneumonia is not excluded clinical correlation is recommended. No lobar consolidation, pleural effusion, pneumothorax. No acute osseous pathology. IMPRESSION: Cardiomegaly with findings of mild CHF.  Pneumonia is not excluded. Electronically Signed   By: Anner Crete M.D.   On: 09/15/2020 19:15    Impression: *Acute GI bleeding-source likely upper given melena *Acute blood loss anemia due to above *History of NSAID use *Chronic anticoagulation with Eliquis  Plan: Etiology of patient's upper GI bleeding unclear. Differential includes gastritis, peptic ulcer disease, Dieulafoy lesion, AVMs, malignancy, or other.  Patient needs further evaluation with upper endoscopy. Discussed with hospitalist. We would like to maximize patient from a respiratory standpoint. Further diuresis today. We will plan on EGD tomorrow morning. Clear liquids okay today, n.p.o. after midnight. Continue on IV Protonix for now. Hold Eliquis pending endoscopic evaluation.  The risks including infection, bleed, or perforation as well as benefits, limitations, alternatives and imponderables have been reviewed with the patient. Potential for esophageal dilation, biopsy, etc. have also been reviewed.  Questions have been answered. All parties agreeable.  Thank you for letting us take part in this patient's care.   LOS: 1 day     09/16/2020, 11:17 AM

## 2020-09-16 NOTE — ED Notes (Signed)
Pt noted eating chips from family member. Nurse made them aware she is NPO and is not allowed food. Nurse explained if GI needed to have surgery, pt would not be able to have surgery if she is eating.

## 2020-09-16 NOTE — H&P (Signed)
TRH H&P    Patient Demographics:    Maria Fry, is a 69 y.o. female  MRN: [id number]  DOB - 07/08/1951  Admit Date - 09/15/2020  Referring MD/NP/PA: Regenia Skeeter  Outpatient Primary MD for the patient is [place], [name], MD  Patient coming from: Home  Chief complaint-melena   HPI:    Maria Fry  is a 69 y.o. female, with history of coronary artery disease, hypothyroidism, hyperlipidemia, hypertension, chronic combined systolic and diastolic heart failure, and more presents to the ER with a chief complaint of black diarrhea.  Patient reports that she had cardioversion yesterday.  She spent most a day yesterday recovering from anesthesia, and has not done much.  She woke up this morning and had uncontrollable diarrhea that was black.  She reports this happened 3 times.  The last episode was accompanied by stomach cramping, and incontinence.  Patient called the doctor who did the cardioversion and they told her to call her PCP.  PCP was full today and urged her to go to the urgent care.  At urgent care Hemoccult was positive and they advised her to come to the ER.  In the ER patient was found to be hypoxic down to 87%.  Patient was also found to have a wheeze.  Patient reports that she has been short of breath, but she had attributed that to her A. fib for which she had a cardioversion.  Patient does not know if she has been short of breath since the cardioversion because she has not been awake and alert to do anything until today when she had all the diarrhea.  Patient reports that when she had her abdominal pain it was in the lower part of her abdomen.  It was crampy in nature.  She describes it as severe.  Patient reports no signs of anemia.  When asked how her abdomen feels at this time-she reports it feels empty.  As for her hypoxia -patient does not wear oxygen at home.  She denies chest pain, palpitations.  She does  report that she has had swelling in her ankles.  Swelling in her ankles has been there since 2016, but has been worse over the past few days.  Patient reports that she has had orthopnea.  It was worse this morning than normal.  Patient reports compliance with her congestive heart failure medications.  In the ED  Temp 97.5, heart rate 86, respiratory rate 28, blood pressure 164/89, 98% on 2 l nasal cannula Sats on room air were as low as 87% No leukocytosis with a white blood cell count of 6.8, hemoglobin is stable at 10.3, previous hemoglobin was 10.9 CHEM panel is unremarkable BNP is 1278 Covid and flu are negative Chest x-ray shows cardiomegaly with findings of mild CHF.  Pneumonia is not excluded Patient was given IV Lasix, put on 2 l nasal cannula EKG shows a heart rate of 87, sinus rhythm, QTC 442  Last echo was in September 2021 and showed ejection fraction of 40 to 45% with mildly decreased function of  the left ventricle     Review of systems:    In addition to the HPI above,  Review of Systems  Constitutional: Positive for malaise/fatigue. Negative for chills, fever and weight loss.  HENT: Negative for congestion, nosebleeds, sinus pain and sore throat.   Eyes: Negative for blurred vision and double vision.  Respiratory: Positive for shortness of breath and wheezing. Negative for cough.   Cardiovascular: Positive for orthopnea and leg swelling. Negative for chest pain and palpitations.  Gastrointestinal: Positive for abdominal pain, diarrhea and melena. Negative for nausea and vomiting.  Genitourinary: Negative for dysuria, frequency and urgency.  Musculoskeletal: Negative for falls and myalgias.  Skin: Negative for itching and rash.  Neurological: Negative for dizziness, tingling, focal weakness and weakness.  Psychiatric/Behavioral: Negative for substance abuse.    All other systems reviewed and are negative.    Past History of the following :    Past Medical  History:  Diagnosis Date   CAD (coronary artery disease)    a. 06/07/15 NSTEMI/Cath: Dist LAD 100%, mid LAD 10%. Otw nl cors. Nl EF w/ inferoapical AK.   Chronic combined systolic (congestive) and diastolic (congestive) heart failure ([place]) 04/23/2016   a. 04/2016 Echo: EF 45%, Gr2 DD; b. 11/2019 Echo: EF 45-50%, mod LVH. Gr2 DD. Nl RV fxn. Sev dil LA.    Complication of anesthesia    Essential hypertension    Hip pain    Hyperlipidemia    Hypothyroidism    MI (myocardial infarction) ([place]) 06/05/2015   PONV (postoperative nausea and vomiting)    Vitamin D deficiency       Past Surgical History:  Procedure Laterality Date   ABDOMINAL HYSTERECTOMY     CARDIAC CATHETERIZATION N/A 06/06/2015   Procedure: Left Heart Cath and Coronary Angiography;  Surgeon: Peter M Martinique, MD;  Location: [facility];  Service: Cardiovascular;  Laterality: N/A;   CARDIOVERSION N/A 09/14/2020   Procedure: CARDIOVERSION;  Surgeon: Satira Sark, MD;  [facility];  Service: Cardiovascular;  Laterality: N/A;   CHOLECYSTECTOMY     COLONOSCOPY     COLONOSCOPY N/A 08/13/2017   Procedure: COLONOSCOPY;  Surgeon: Daneil Dolin, MD;  [facility];  Service: Endoscopy;  Laterality: N/A;  8:30 AM   ESOPHAGOGASTRODUODENOSCOPY N/A 09/11/2016   Procedure: ESOPHAGOGASTRODUODENOSCOPY (EGD);  Surgeon: Daneil Dolin, MD;  [facility];  Service: Endoscopy;  Laterality: N/A;  7:45 am - moved to 10/11 @ 10:30   Heel spurs     MALONEY DILATION N/A 09/11/2016   Procedure: Venia Minks DILATION;  Surgeon: Daneil Dolin, MD;  [facility];  Service: Endoscopy;  Laterality: N/A;   POLYPECTOMY  08/13/2017   Procedure: POLYPECTOMY;  Surgeon: Daneil Dolin, MD;  [facility];  Service: Endoscopy;;  colon   SHOULDER ARTHROSCOPY     TUBAL LIGATION        Social History:      Social History   Tobacco Use   Smoking status: Former Smoker     Types: Cigarettes    Start date: 12/02/1977    Quit date: 12/02/2002    Years since quitting: 17.8   Smokeless tobacco: Never Used  Substance Use Topics   Alcohol use: No    Alcohol/week: 0.0 standard drinks       Family History :     Family History  Problem Relation Age of Onset   Heart failure Father  Deceased   Heart attack Father        Deceased   Stroke Sister        Deceased   Heart failure Brother        Deceased   Cancer Brother        Deceased   Heart attack Brother        Deceased   Colon cancer Neg Hx       Home Medications:   Prior to Admission medications   Medication Sig Start Date End Date Taking? Authorizing Provider  acetaminophen (TYLENOL) 500 MG tablet Take 500 mg by mouth every 6 (six) hours as needed for mild pain or moderate pain. Reported on 04/22/2016    [provider]  apixaban (ELIQUIS) 5 MG TABS tablet Take 1 tablet (5 mg total) by mouth 2 (two) times daily. 08/18/20   Barrett, Evelene Croon, PA-C  atorvastatin (LIPITOR) 80 MG tablet Take 1 tablet (80 mg total) by mouth at bedtime. 01/15/20   Roxan Hockey, MD  calcium carbonate (CALCIUM 600) 600 MG TABS tablet Take 1 tablet (600 mg total) by mouth 2 (two) times daily with a meal. 08/23/20   Hutchinson, [name], MD  Cholecalciferol (VITAMIN D) 50 MCG (2000 UT) CAPS Take 1 tablet daily Patient taking differently: Take 2,000 Units by mouth daily.  08/23/20   Alycia Rossetti, MD  ferrous sulfate 325 (65 FE) MG EC tablet Take 325 mg by mouth daily.    [provider]  furosemide (LASIX) 40 MG tablet Take 40 mg twice a day 4 days/week (Tuesday, Thursday, Saturday, Sunday) and take 40 mg  daily 3 days/week (Monday, Wednesday and Friday) Patient taking differently: Take 40 mg by mouth See admin instructions. Take 40 mg by mouth once daily on Tuesday, Thursday, Saturday, Sunday and take 40 mg  Twice daily on Monday, Wednesday and Friday 08/31/20   Barrett, Evelene Croon, PA-C    gabapentin (NEURONTIN) 300 MG capsule Take 300 mg by mouth 2 (two) times daily.     [provider]  KLOR-CON M15 15 MEQ tablet Take 1 tablet by mouth twice daily Patient taking differently: Take 15 mEq by mouth 2 (two) times daily.  03/08/20   [name], MD  levothyroxine (SYNTHROID) 100 MCG tablet TAKE 1 TABLET BY MOUTH ONCE DAILY BEFORE BREAKFAST Patient taking differently: Take 100 mcg by mouth daily before breakfast.  06/14/20   Nida, Marella Chimes, MD  loratadine (CLARITIN) 10 MG tablet Take 10 mg by mouth daily.     [provider]  metoprolol tartrate (LOPRESSOR) 25 MG tablet Take 1 tablet (25 mg total) by mouth 2 (two) times daily. 08/18/20 11/16/20  Barrett, Evelene Croon, PA-C  nitroGLYCERIN (NITROSTAT) 0.4 MG SL tablet DISSOLVE ONE TABLET UNDER THE TONGUE EVERY 5 MINUTES AS NEEDED FOR CHEST PAIN.  DO NOT EXCEED A TOTAL OF 3 DOSES IN 15 MINUTES Patient taking differently: Place 0.4 mg under the tongue every 5 (five) minutes as needed for chest pain.  12/15/18   [name], MD  ondansetron (ZOFRAN ODT) 4 MG disintegrating tablet 4mg  ODT q4 hours prn nausea/vomit Patient taking differently: Take 4 mg by mouth every 4 (four) hours as needed for nausea or vomiting.  04/08/20   Milton Ferguson, MD  potassium chloride (KLOR-CON) 10 MEQ tablet Take 1 tablet (10 meq) 4 days/week (Tuesday, Thursday, Saturday, Sunday) and take 2 tablets (20 meq) 3 days/week (Monday, Wednesday, Friday) Patient not taking: Reported on 09/11/2020 01/04/20  [name], MD  ranolazine (RANEXA) 500 MG 12 hr tablet Take 1 tablet (500 mg total) by mouth 2 (two) times daily. 08/23/20   Alycia Rossetti, MD     Allergies:     Allergies  Allergen Reactions   Lisinopril Anaphylaxis   Ranolazine Anaphylaxis    Dizzy, Reaction occurred with generic med   Vicodin [Hydrocodone-Acetaminophen] Diarrhea and Nausea And Vomiting   Amoxicillin Swelling   Demerol [Meperidine] Nausea  And Vomiting   Lodine [Etodolac] Rash     Physical Exam:   Vitals  Blood pressure (!) 108/59, pulse 70, temperature 98 F (36.7 C), temperature source Oral, resp. rate 20, height 5' (1.524 m), weight 93 kg, SpO2 96 %.  1.  General: Patient lying supine in bed with head of bed elevated, no acute distress  2. Psychiatric: Alert and oriented x3, mood and behavior normal for situation  3. Neurologic: Cranial nerves II through XII are grossly intact, moves all 4 extremities voluntarily, no focal deficit on limited exam  4. HEENMT:  Head is atraumatic, normocephalic, pupils are reactive to light, trachea is midline, neck is supple, mucous membranes are moist  5. Respiratory : Fine crackles in the lower lung fields bilaterally, no wheezing no rhonchi  6. Cardiovascular : Heart rate is normal, rhythm is regular, no murmurs rubs or gallops 2+ pitting edema to mid shin  7. Gastrointestinal:  Abdomen is soft nondistended nontender to palpation and obese  8. Skin:  No acute lesions on limited skin exam  9.Musculoskeletal:  Peripheral edema as described above, no acute deformity, peripheral pulses palpated    Data Review:    CBC Recent Labs  Lab 09/12/20 1141 09/15/20 1626  WBC 5.6 6.8  HGB 10.9* 10.3*  HCT 33.8* 31.8*  PLT 295 260  MCV 88.5 89.3  MCH 28.5 28.9  MCHC 32.2 32.4  RDW 17.1* 17.3*  LYMPHSABS 1.9 2.0  MONOABS 0.4 0.3  EOSABS 0.1 0.1  BASOSABS 0.0 0.0   ------------------------------------------------------------------------------------------------------------------  Results for orders placed or performed during the hospital encounter of 09/15/20 (from the past 48 hour(s))  Comprehensive metabolic panel     Status: Abnormal   Collection Time: 09/15/20  4:26 PM  Result Value Ref Range   Sodium 138 135 - 145 mmol/L   Potassium 3.8 3.5 - 5.1 mmol/L   Chloride 102 98 - 111 mmol/L   CO2 23 22 - 32 mmol/L   Glucose, Bld 99 70 - 99 mg/dL    Comment:  Glucose reference range applies only to samples taken after fasting for at least 8 hours.   BUN 16 8 - 23 mg/dL   Creatinine, Ser 1.04 (H) 0.44 - 1.00 mg/dL   Calcium 8.9 8.9 - 10.3 mg/dL   Total Protein 7.5 6.5 - 8.1 g/dL   Albumin 3.3 (L) 3.5 - 5.0 g/dL   AST 45 (H) 15 - 41 U/L   ALT 35 0 - 44 U/L   Alkaline Phosphatase 88 38 - 126 U/L   Total Bilirubin 0.7 0.3 - 1.2 mg/dL   GFR, Estimated 56 (L) >60 mL/min   Anion gap 13 5 - 15    Comment: Performed at [facility], [address], [place], [place] [address]  CBC with Differential     Status: Abnormal   Collection Time: 09/15/20  4:26 PM  Result Value Ref Range   WBC 6.8 4.0 - 10.5 K/uL   RBC 3.56 (L) 3.87 - 5.11 MIL/uL   Hemoglobin 10.3 (L)  12.0 - 15.0 g/dL   HCT 31.8 (L) 36 - 46 %   MCV 89.3 80.0 - 100.0 fL   MCH 28.9 26.0 - 34.0 pg   MCHC 32.4 30.0 - 36.0 g/dL   RDW 17.3 (H) 11.5 - 15.5 %   Platelets 260 150 - 400 K/uL   nRBC 0.0 0.0 - 0.2 %   Neutrophils Relative % 65 %   Neutro Abs 4.4 1.7 - 7.7 K/uL   Lymphocytes Relative 29 %   Lymphs Abs 2.0 0.7 - 4.0 K/uL   Monocytes Relative 5 %   Monocytes Absolute 0.3 0.1 - 1.0 K/uL   Eosinophils Relative 1 %   Eosinophils Absolute 0.1 0.0 - 0.5 K/uL   Basophils Relative 0 %   Basophils Absolute 0.0 0.0 - 0.1 K/uL   Immature Granulocytes 0 %   Abs Immature Granulocytes 0.01 0.00 - 0.07 K/uL    Comment: Performed at [facility], [address], [place], [place] [address]  Brain natriuretic peptide     Status: Abnormal   Collection Time: 09/15/20  7:55 PM  Result Value Ref Range   B Natriuretic Peptide 1,278.0 (H) 0.0 - 100.0 pg/mL    Comment: Performed at [facility], [address] [place], [place] [address]  Respiratory Panel by RT PCR (Flu A&B, Covid) - Nasopharyngeal Swab     Status: None   Collection Time: 09/15/20  8:15 PM   Specimen: Nasopharyngeal Swab  Result Value Ref Range   SARS Coronavirus 2 by RT PCR NEGATIVE NEGATIVE    Comment: (NOTE) SARS-CoV-2 target  nucleic acids are NOT DETECTED.  The SARS-CoV-2 RNA is generally detectable in upper respiratoy specimens during the acute phase of infection. The lowest concentration of SARS-CoV-2 viral copies this assay can detect is 131 copies/mL. A negative result does not preclude SARS-Cov-2 infection and should not be used as the sole basis for treatment or other patient management decisions. A negative result may occur with  improper specimen collection/handling, submission of specimen other than nasopharyngeal swab, presence of viral mutation(s) within the areas targeted by this assay, and inadequate number of viral copies (<131 copies/mL). A negative result must be combined with clinical observations, patient history, and epidemiological information. The expected result is Negative.  Fact Sheet for Patients:  PinkCheek.be  Fact Sheet for Healthcare Providers:  GravelBags.it  This test is no t yet approved or cleared by the [place] FDA and  has been authorized for detection and/or diagnosis of SARS-CoV-2 by FDA under an Emergency Use Authorization (EUA). This EUA will remain  in effect (meaning this test can be used) for the duration of the COVID-19 declaration under Section 564(b)(1) of the Act, 21 U.S.C. section 360bbb-3(b)(1), unless the authorization is terminated or revoked sooner.     Influenza A by PCR NEGATIVE NEGATIVE   Influenza B by PCR NEGATIVE NEGATIVE    Comment: (NOTE) The Xpert Xpress SARS-CoV-2/FLU/RSV assay is intended as an aid in  the diagnosis of influenza from Nasopharyngeal swab specimens and  should not be used as a sole basis for treatment. Nasal washings and  aspirates are unacceptable for Xpert Xpress SARS-CoV-2/FLU/RSV  testing.  Fact Sheet for Patients: PinkCheek.be  Fact Sheet for Healthcare Providers: GravelBags.it  This test is  not yet approved or cleared by the [place] FDA and  has been authorized for detection and/or diagnosis of SARS-CoV-2 by  FDA under an Emergency Use Authorization (EUA). This EUA will remain  in effect (meaning this test  can be used) for the duration of the  Covid-19 declaration under Section 564(b)(1) of the Act, 21  U.S.C. section 360bbb-3(b)(1), unless the authorization is  terminated or revoked. Performed at [facility], [address], [place], [place] [address]     Chemistries  Recent Labs  Lab 09/12/20 1141 09/15/20 1626  NA 136 138  K 3.9 3.8  CL 100 102  CO2 28 23  GLUCOSE 103* 99  BUN 14 16  CREATININE 1.13* 1.04*  CALCIUM 8.5* 8.9  AST  --  45*  ALT  --  35  ALKPHOS  --  88  BILITOT  --  0.7   ------------------------------------------------------------------------------------------------------------------  ------------------------------------------------------------------------------------------------------------------ GFR: Estimated Creatinine Clearance: 53.4 mL/min (A) (by C-G formula based on SCr of 1.04 mg/dL (H)). Liver Function Tests: Recent Labs  Lab 09/15/20 1626  AST 45*  ALT 35  ALKPHOS 88  BILITOT 0.7  PROT 7.5  ALBUMIN 3.3*   No results for input(s): LIPASE, AMYLASE in the last 168 hours. No results for input(s): AMMONIA in the last 168 hours. Coagulation Profile: Recent Labs  Lab 09/12/20 1141  INR 1.4*   Cardiac Enzymes: No results for input(s): CKTOTAL, CKMB, CKMBINDEX, TROPONINI in the last 168 hours. BNP (last 3 results) No results for input(s): PROBNP in the last 8760 hours. HbA1C: No results for input(s): HGBA1C in the last 72 hours. CBG: No results for input(s): GLUCAP in the last 168 hours. Lipid Profile: No results for input(s): CHOL, HDL, LDLCALC, TRIG, CHOLHDL, LDLDIRECT in the last 72 hours. Thyroid Function Tests: No results for input(s): TSH, T4TOTAL, FREET4, T3FREE, THYROIDAB in the last 72 hours. Anemia  Panel: No results for input(s): VITAMINB12, FOLATE, FERRITIN, TIBC, IRON, RETICCTPCT in the last 72 hours.  --------------------------------------------------------------------------------------------------------------- Urine analysis:    Component Value Date/Time   COLORURINE YELLOW 06/29/2019 0918   APPEARANCEUR CLOUDY (A) 06/29/2019 0918   LABSPEC 1.020 06/29/2019 0918   PHURINE 7.5 06/29/2019 0918   GLUCOSEU NEGATIVE 06/29/2019 0918   HGBUR 1+ (A) 06/29/2019 0918   BILIRUBINUR NEGATIVE 01/17/2016 2018   KETONESUR NEGATIVE 06/29/2019 0918   PROTEINUR 1+ (A) 06/29/2019 0918   NITRITE NEGATIVE 06/29/2019 0918   LEUKOCYTESUR 3+ (A) 06/29/2019 0918      Imaging Results:    DG Chest Portable 1 View  Result Date: 09/15/2020 CLINICAL DATA:  69 year old female with acute dyspnea. EXAM: PORTABLE CHEST 1 VIEW COMPARISON:  Chest radiograph dated 08/15/2009 and CT dated 01/14/2020. FINDINGS: There is cardiomegaly with vascular congestion and mild edema. Pneumonia is not excluded clinical correlation is recommended. No lobar consolidation, pleural effusion, pneumothorax. No acute osseous pathology. IMPRESSION: Cardiomegaly with findings of mild CHF.  Pneumonia is not excluded. Electronically Signed   By: Anner Crete M.D.   On: 09/15/2020 19:15    My personal review of EKG: Rhythm NSR, Rate 87/min, QTc 442 ,no Acute ST changes   Assessment & Plan:    Active Problems:   CHF exacerbation ([place])   1. Acute respiratory failure with hypoxia 1. Oxygen sats as low as 87% 2. Corrected with 2 l nasal cannula 3. Chest x-ray shows signs of CHF 4. Wean off O2 as tolerated 5. Monitor on telemetry 2. GI bleed, chronic anemia 1. Reported 3 episodes of black diarrhea 2. FOBT positive at urgent care, repeat Hemoccult here 3. Hemoglobin stable at 10.3 4. Trend hemoglobin 5. Protonix twice daily 6. N.p.o. after midnight -as patient is insisting on eating at the time of admission 7. Consult to  GI  8. Type and screen pending 9. Hold Eliquis 10. Continue iron for chronic anemia 11. Continue to monitor 3. CHF exacerbation, acute 1. BNP is in the 1200s 2. Chest x-ray shows cardiomegaly with findings consistent with CHF 3. Last echo was in September 2021 showing ejection fraction 40 to 45% 4. Patient reports increased peripheral edema and orthopnea 5. Continue statin, Lopressor, Ranexa 6. Continue IV Lasix for diuresis 7. Daily weights, intake and output, low-sodium diet with fluid restriction 8. Continue to monitor 4. Hypothyroidism 1. Check TSH 2. Continue Synthroid 5. A. Fib 1. Holding Eliquis 2. Continue Lopressor 6.    DVT Prophylaxis-   SCDs   AM Labs Ordered, also please review Full Orders  Family Communication: Admission, patients condition and plan of care including tests being ordered have been discussed with the patient and daughter who indicate understanding and agree with the plan and Code Status.  Code Status:  Full   Admission status: Inpatient :The appropriate admission status for this patient is INPATIENT. Inpatient status is judged to be reasonable and necessary in order to provide the required intensity of service to ensure the patient's safety. The patient's presenting symptoms, physical exam findings, and initial radiographic and laboratory data in the context of their chronic comorbidities is felt to place them at high risk for further clinical deterioration. Furthermore, it is not anticipated that the patient will be medically stable for discharge from the hospital within 2 midnights of admission. The following factors support the admission status of inpatient.     The patient's presenting symptoms include Melena The worrisome physical exam findings include peripheral edema The initial radiographic and laboratory data are worrisome because of CXR = cardiomegaly, CHF, pneumonia not excluded. The chronic co-morbidities include vitamin D deficiency,  hypothyroidism, hyperlipidemia, coronary artery disease, chronic combined systolic and diastolic heart failure       * I certify that at the point of admission it is my clinical judgment that the patient will require inpatient hospital care spanning beyond 2 midnights from the point of admission due to high intensity of service, high risk for further deterioration and high frequency of surveillance required.*  Time spent in minutes : [place]

## 2020-09-16 NOTE — ED Notes (Signed)
Dr. Abbey Chatters verbalized liquid diet and NPO after midnight tonight.

## 2020-09-17 ENCOUNTER — Encounter (HOSPITAL_COMMUNITY)
Admission: EM | Disposition: A | Discharge status: Home / Self Care | Payer: Self-pay | Source: Home / Self Care | Attending: Internal Medicine

## 2020-09-17 ENCOUNTER — Inpatient Hospital Stay (HOSPITAL_COMMUNITY): Payer: PPO | Admitting: Anesthesiology

## 2020-09-17 DIAGNOSIS — I4891 Unspecified atrial fibrillation: Secondary | ICD-10-CM

## 2020-09-17 DIAGNOSIS — K921 Melena: Secondary | ICD-10-CM

## 2020-09-17 DIAGNOSIS — E039 Hypothyroidism, unspecified: Secondary | ICD-10-CM

## 2020-09-17 DIAGNOSIS — K2900 Acute gastritis without bleeding: Secondary | ICD-10-CM

## 2020-09-17 DIAGNOSIS — E669 Obesity, unspecified: Secondary | ICD-10-CM

## 2020-09-17 DIAGNOSIS — K2970 Gastritis, unspecified, without bleeding: Secondary | ICD-10-CM

## 2020-09-17 DIAGNOSIS — K219 Gastro-esophageal reflux disease without esophagitis: Secondary | ICD-10-CM

## 2020-09-17 HISTORY — PX: ESOPHAGOGASTRODUODENOSCOPY (EGD) WITH PROPOFOL: SHX5813

## 2020-09-17 HISTORY — PX: BIOPSY: SHX5522

## 2020-09-17 LAB — CBC
HCT: 33.5 % — ABNORMAL LOW (ref 36.0–46.0)
Hemoglobin: 10.7 g/dL — ABNORMAL LOW (ref 12.0–15.0)
MCH: 28.7 pg (ref 26.0–34.0)
MCHC: 31.9 g/dL (ref 30.0–36.0)
MCV: 89.8 fL (ref 80.0–100.0)
Platelets: 262 10*3/uL (ref 150–400)
RBC: 3.73 MIL/uL — ABNORMAL LOW (ref 3.87–5.11)
RDW: 17.5 % — ABNORMAL HIGH (ref 11.5–15.5)
WBC: 7.4 10*3/uL (ref 4.0–10.5)
nRBC: 0 % (ref 0.0–0.2)

## 2020-09-17 LAB — BASIC METABOLIC PANEL
Anion gap: 6 (ref 5–15)
BUN: 9 mg/dL (ref 8–23)
CO2: 27 mmol/L (ref 22–32)
Calcium: 7.8 mg/dL — ABNORMAL LOW (ref 8.9–10.3)
Chloride: 102 mmol/L (ref 98–111)
Creatinine, Ser: 0.93 mg/dL (ref 0.44–1.00)
GFR, Estimated: >60 mL/min (ref >60)
Glucose, Bld: 101 mg/dL — ABNORMAL HIGH (ref 70–99)
Potassium: 3.5 mmol/L (ref 3.5–5.1)
Sodium: 135 mmol/L (ref 135–145)

## 2020-09-17 SURGERY — ESOPHAGOGASTRODUODENOSCOPY (EGD) WITH PROPOFOL
Anesthesia: General

## 2020-09-17 MED ORDER — SODIUM CHLORIDE 0.9 % IV SOLN: INTRAVENOUS | Status: DC

## 2020-09-17 MED ORDER — PANTOPRAZOLE SODIUM 40 MG PO TBEC
40.0000 mg | DELAYED_RELEASE_TABLET | Freq: Two times a day (BID) | ORAL | 1 refills | Status: DC
Start: 2020-09-17 — End: 2020-12-22

## 2020-09-17 MED ORDER — LIDOCAINE VISCOUS HCL 2 % MT SOLN
OROMUCOSAL | Status: DC | PRN
  Administered 2020-09-17: 5 mL via OROMUCOSAL

## 2020-09-17 MED ORDER — FENTANYL CITRATE (PF) 100 MCG/2ML IJ SOLN
INTRAMUSCULAR | Status: DC | PRN
  Administered 2020-09-17: 25 ug via INTRAVENOUS

## 2020-09-17 MED ORDER — ONDANSETRON HCL 4 MG/2ML IJ SOLN
INTRAMUSCULAR | Status: DC | PRN
  Administered 2020-09-17: 4 mg via INTRAVENOUS

## 2020-09-17 MED ORDER — STERILE WATER FOR IRRIGATION IR SOLN
Status: DC | PRN
  Administered 2020-09-17: 1.5 mL

## 2020-09-17 MED ORDER — PROPOFOL 500 MG/50ML IV EMUL
INTRAVENOUS | Status: DC | PRN
  Administered 2020-09-17: 100 ug/kg/min via INTRAVENOUS

## 2020-09-17 MED ORDER — ETOMIDATE 2 MG/ML IV SOLN
INTRAVENOUS | Status: DC | PRN
  Administered 2020-09-17: 10 mg via INTRAVENOUS

## 2020-09-17 MED ORDER — LIDOCAINE 2% (20 MG/ML) 5 ML SYRINGE
INTRAMUSCULAR | Status: AC
  Filled 2020-09-17: qty 5

## 2020-09-17 MED ORDER — LACTATED RINGERS IV SOLN: Freq: Once | INTRAVENOUS | Status: AC

## 2020-09-17 MED ORDER — DEXMEDETOMIDINE (PRECEDEX) IN NS 20 MCG/5ML (4 MCG/ML) IV SYRINGE
PREFILLED_SYRINGE | INTRAVENOUS | Status: AC
  Filled 2020-09-17: qty 5

## 2020-09-17 MED ORDER — LIDOCAINE VISCOUS HCL 2 % MT SOLN
OROMUCOSAL | Status: AC
  Filled 2020-09-17: qty 15

## 2020-09-17 MED ORDER — PANTOPRAZOLE SODIUM 40 MG PO TBEC: 40.0000 mg | DELAYED_RELEASE_TABLET | Freq: Two times a day (BID) | ORAL | Status: DC

## 2020-09-17 MED ORDER — PROPOFOL 10 MG/ML IV BOLUS
INTRAVENOUS | Status: AC
  Filled 2020-09-17: qty 40

## 2020-09-17 MED ORDER — ETOMIDATE 2 MG/ML IV SOLN
INTRAVENOUS | Status: AC
  Filled 2020-09-17: qty 10

## 2020-09-17 MED ORDER — SIMETHICONE 40 MG/0.6ML PO SUSP
ORAL | Status: AC
  Filled 2020-09-17: qty 0.6

## 2020-09-17 MED ORDER — PROPOFOL 10 MG/ML IV BOLUS
INTRAVENOUS | Status: DC | PRN
  Administered 2020-09-17: 20 mg via INTRAVENOUS

## 2020-09-17 MED ORDER — LIDOCAINE 2% (20 MG/ML) 5 ML SYRINGE
INTRAMUSCULAR | Status: DC | PRN
  Administered 2020-09-17: 60 mg via INTRAVENOUS

## 2020-09-17 MED ORDER — FENTANYL CITRATE (PF) 100 MCG/2ML IJ SOLN
INTRAMUSCULAR | Status: AC
  Filled 2020-09-17: qty 2

## 2020-09-17 NOTE — Op Note (Signed)
[facility] Patient Name: Maria Fry Procedure Date: 09/17/2020 8:35 AM MRN: [id number] Date of Birth: 09-30-1951 Attending MD: Elon Alas. Abbey Chatters DO CSN: [id number] Age: 69 Admit Type: Inpatient Procedure:                Upper GI endoscopy Indications:              Melena Providers:                Elon Alas. Abbey Chatters, DO, Caprice Kluver, Lurline Del, RN Referring MD:              Medicines:                See the Anesthesia note for documentation of the                            administered medications Complications:            No immediate complications. Estimated Blood Loss:     Estimated blood loss was minimal. Procedure:                Pre-Anesthesia Assessment:                           - The anesthesia plan was to use monitored                            anesthesia care (MAC).                           After obtaining informed consent, the endoscope was                            passed under direct vision. Throughout the                            procedure, the patient's blood pressure, pulse, and                            oxygen saturations were monitored continuously. The                            GIF-H190 ([id number]) scope was introduced through the                            mouth, and advanced to the second part of duodenum.                            The upper GI endoscopy was accomplished without                            difficulty. The patient tolerated the procedure                            well. Scope In: 9:19:01 AM Scope Out: 9:22:03 AM Total Procedure Duration: 0 hours 3 minutes 2 seconds  Findings:      There is no endoscopic evidence of bleeding, esophagitis, inflammation,  ulcerations or varices in the entire esophagus.      Diffuse moderate inflammation characterized by erosions and erythema was       found in the entire examined stomach. Biopsies were taken with a cold       forceps for Helicobacter pylori testing.      The duodenal bulb, first  portion of the duodenum and second portion of       the duodenum were normal. Impression:               - Gastritis. Biopsied.                           - Normal duodenal bulb, first portion of the                            duodenum and second portion of the duodenum. Moderate Sedation:      Per Anesthesia Care Recommendation:           - Return patient to hospital ward for ongoing care.                           - Soft diet.                           - Use a proton pump inhibitor PO BID.                           - No ibuprofen, naproxen, or other non-steroidal                            anti-inflammatory drugs.                           - No obvious source of GI bleeding found. Patient                            does have gastritis with a few small erosions which                            could have been oozing blood. I biopsied to rule                            out H. pylori. Okay to resume Eliquis. Patient will                            need to follow up with GI in outpatient clinic. We                            will likely need to update her colonoscopy at that                            time. Okay to discharge home from GI standpoint. Procedure Code(s):        --- Professional ---  19471, Esophagogastroduodenoscopy, flexible,                            transoral; with biopsy, single or multiple Diagnosis Code(s):        --- Professional ---                           K29.70, Gastritis, unspecified, without bleeding                           K92.1, Melena (includes Hematochezia) CPT copyright 2019 [employer]. All rights reserved. The codes documented in this report are preliminary and upon coder review may  be revised to meet current compliance requirements. Elon Alas. Abbey Chatters, DO [place] Abbey Chatters, DO 09/17/2020 9:33:18 AM This report has been signed electronically. Number of Addenda: 0

## 2020-09-17 NOTE — Discharge Summary (Signed)
Physician Discharge Summary  Maria Fry GOT:[id number] DOB: 31-Jul-1951 DOA: 09/15/2020  PCP: Alycia Rossetti, MD  Admit date: 09/15/2020 Discharge date: 09/17/2020  Time spent: 35 minutes  Recommendations for Outpatient Follow-up:  1. Repeat CBC to follow hemoglobin trend 2. Repeat basic metabolic panel to follow twice renal function 3. Reassess blood pressure and adjust antihypertensive regimen as needed.   Discharge Diagnoses:  Acute respiratory failure with hypoxia Acute on chronic systolic CHF exacerbation (HCC) Obesity, Class III, BMI 40-49.9 (morbid obesity) ([place]) Gastroesophageal reflux disease Gastritis Hypothyroidism Melena   Discharge Condition: Stable and improved.  Discharged home with instruction to follow-up with PCP, cardiology service and GI as an outpatient.  CODE STATUS: Full code.  Diet recommendation: Heart healthy/low-sodium and low calorie diet  Filed Weights   09/15/20 1552 09/17/20 0500  Weight: 93 kg 93 kg    History of present illness:  As per H&P written by Dr. Clearence Ped on 09/16/20 Maria Fry  is a 69 y.o. female, with history of coronary artery disease, hypothyroidism, hyperlipidemia, hypertension, chronic combined systolic and diastolic heart failure, and more presents to the ER with a chief complaint of black diarrhea.  Patient reports that she had cardioversion yesterday.  She spent most a day yesterday recovering from anesthesia, and has not done much.  She woke up this morning and had uncontrollable diarrhea that was black.  She reports this happened 3 times.  The last episode was accompanied by stomach cramping, and incontinence.  Patient called the doctor who did the cardioversion and they told her to call her PCP.  PCP was full today and urged her to go to the urgent care.  At urgent care Hemoccult was positive and they advised her to come to the ER.  In the ER patient was found to be hypoxic down to 87%.  Patient was also found to have  a wheeze.  Patient reports that she has been short of breath, but she had attributed that to her A. fib for which she had a cardioversion.  Patient does not know if she has been short of breath since the cardioversion because she has not been awake and alert to do anything until today when she had all the diarrhea.  Patient reports that when she had her abdominal pain it was in the lower part of her abdomen.  It was crampy in nature.  She describes it as severe.  Patient reports no signs of anemia.  When asked how her abdomen feels at this time-she reports it feels empty.  As for her hypoxia -patient does not wear oxygen at home.  She denies chest pain, palpitations.  She does report that she has had swelling in her ankles.  Swelling in her ankles has been there since 2016, but has been worse over the past few days.  Patient reports that she has had orthopnea.  It was worse this morning than normal.  Patient reports compliance with her congestive heart failure medications.  In the ED  Temp 97.5, heart rate 86, respiratory rate 28, blood pressure 164/89, 98% on 2 l nasal cannula Sats on room air were as low as 87% No leukocytosis with a white blood cell count of 6.8, hemoglobin is stable at 10.3, previous hemoglobin was 10.9 CHEM panel is unremarkable BNP is 1278 Covid and flu are negative Chest x-ray shows cardiomegaly with findings of mild CHF.  Pneumonia is not excluded Patient was given IV Lasix, put on 2 l nasal cannula EKG shows a  heart rate of 87, sinus rhythm, QTC 442  Last echo was in September 2021 and showed ejection fraction of 40 to 45% with mildly decreased function of the left ventricle  Hospital Course:  1-acute respiratory failure with hypoxia: In the setting of acute on chronic systolic heart failure -Patient presented with O2 sat in 87% requiring 2 L nasal cannula supplementation -Physical exam demonstrated fine bibasilar crackles and chest x-ray it was positive for vascular  congestion -BNP elevated -Excellent response to IV diuretics -Patient discharge home on her previous instructed diuretic regimen and instructions to follow low-sodium diet, daily weights and outpatient follow-up with cardiology service.  2-melanotic stools/upper GI bleed -No overt bleeding since admission -Endoscopy demonstrating erosion and gastritis without acute bleeding -GI service recommended the use of Protonix twice a day and dictate safe to resume Eliquis -Avoid the use of NSAIDs -Follow-up with GI as an outpatient for colonoscopy.  3-chronic atrial fibrillation -Patient is status recent cardioversion -Has remained in sinus rhythm since cardioversion -Denies palpitations -Continue the use of metoprolol and safe to resume Eliquis. -Outpatient follow-up with cardiology service.  4-hypothyroidism -Continue Synthroid  5-morbid obesity -Body mass index is 40.04 kg/m. -Low calorie diet, portion control and increase physical activity discussed with patient.  6-history of coronary artery disease -No chest pain currently -Continue home medication regimen including the use of Ranexa and as needed nitroglycerin.  7-hypertension -Vital signs stable and well-controlled -Resume home antihypertensive regimen -Advised to follow heart healthy diet.  Procedures:  See below for x-ray reports  EGD: 09/17/2020 demonstrating gastritis and erosions without active bleeding.  Pending biopsy to rule out H. pylori taken.  Continue PPI and avoid the use of NSAIDs.  Safe to resume Eliquis.  Outpatient follow-up with gastroenterology service for outpatient colonoscopy.  Consultations:  GI service  Discharge Exam: Vitals:   09/17/20 0932 09/17/20 0945  BP: 131/65 134/73  Pulse: 62 (!) 58  Resp: 13 13  Temp: 97.7 F (36.5 C)   SpO2: 100% 96%    General: No chest pain, no nausea, no vomiting.  Denies palpitations and no further episode of melena or overt bleeding.  Patient tolerating  diet and is feeling ready to go home. Cardiovascular: Rate controlled and currently sinus; no JVD, no rubs, no gallops. Respiratory: Good air movement bilaterally; no crackles, no wheezing, no using accessory muscles.  Good oxygen saturation on room air Abdomen: Obese, soft, nontender, positive bowel sounds Extremities: Trace to 1+ edema (appears to be chronic and in the setting of venous insufficiency and obesity); no cyanosis or clubbing.  Discharge Instructions   Discharge Instructions    (HEART FAILURE PATIENTS) Call MD:  Anytime you have any of the following symptoms: 1) 3 pound weight gain in 24 hours or 5 pounds in 1 week 2) shortness of breath, with or without a dry hacking cough 3) swelling in the hands, feet or stomach 4) if you have to sleep on extra pillows at night in order to breathe.   Complete by: As directed    Diet - low sodium heart healthy   Complete by: As directed    Discharge instructions   Complete by: As directed    Take medications as prescribed Maintain adequate hydration Avoid the use of NSAIDs Use Protonix twice a day as instructed Small multiple meals per day to assist with BH buffering in the stomach Watch amount of sodium, check your weight on daily basis and try to keep your sodium intake to less than 2000 mg  of sodium on daily basis. Follow-up as an outpatient with gastroenterology service (office will contact you with appointment details) Follow-up with cardiology as previously instructed and arrange visit with your PCP in 10 days.   Increase activity slowly   Complete by: As directed      Allergies as of 09/17/2020      Reactions   Lisinopril Anaphylaxis   Ranolazine Anaphylaxis   Dizzy, Reaction occurred with generic med   Vicodin [hydrocodone-acetaminophen] Diarrhea, Nausea And Vomiting   Amoxicillin Swelling   Demerol [meperidine] Nausea And Vomiting   Lodine [etodolac] Rash      Medication List    TAKE these medications   acetaminophen  500 MG tablet Commonly known as: TYLENOL Take 500 mg by mouth every 6 (six) hours as needed for mild pain or moderate pain. Reported on 04/22/2016   apixaban 5 MG Tabs tablet Commonly known as: Eliquis Take 1 tablet (5 mg total) by mouth 2 (two) times daily.   atorvastatin 80 MG tablet Commonly known as: LIPITOR Take 1 tablet (80 mg total) by mouth at bedtime.   calcium carbonate 600 MG Tabs tablet Commonly known as: Calcium 600 Take 1 tablet (600 mg total) by mouth 2 (two) times daily with a meal.   ferrous sulfate 325 (65 FE) MG EC tablet Take 325 mg by mouth daily.   furosemide 40 MG tablet Commonly known as: Lasix Take 40 mg twice a day 4 days/week (Tuesday, Thursday, Saturday, Sunday) and take 40 mg  daily 3 days/week (Monday, Wednesday and Friday) What changed:   how much to take  how to take this  when to take this  additional instructions   gabapentin 300 MG capsule Commonly known as: NEURONTIN Take 300 mg by mouth 2 (two) times daily.   levothyroxine 100 MCG tablet Commonly known as: SYNTHROID TAKE 1 TABLET BY MOUTH ONCE DAILY BEFORE BREAKFAST What changed: See the new instructions.   loratadine 10 MG tablet Commonly known as: CLARITIN Take 10 mg by mouth daily.   metoprolol tartrate 25 MG tablet Commonly known as: LOPRESSOR Take 1 tablet (25 mg total) by mouth 2 (two) times daily.   nitroGLYCERIN 0.4 MG SL tablet Commonly known as: NITROSTAT DISSOLVE ONE TABLET UNDER THE TONGUE EVERY 5 MINUTES AS NEEDED FOR CHEST PAIN.  DO NOT EXCEED A TOTAL OF 3 DOSES IN 15 MINUTES What changed:   how much to take  how to take this  when to take this  reasons to take this  additional instructions   ondansetron 4 MG disintegrating tablet Commonly known as: Zofran ODT 4mg  ODT q4 hours prn nausea/vomit What changed:   how much to take  how to take this  when to take this  reasons to take this  additional instructions   pantoprazole 40 MG  tablet Commonly known as: PROTONIX Take 1 tablet (40 mg total) by mouth 2 (two) times daily.   potassium chloride 10 MEQ tablet Commonly known as: KLOR-CON Take 1 tablet (10 meq) 4 days/week (Tuesday, Thursday, Saturday, Sunday) and take 2 tablets (20 meq) 3 days/week (Monday, Wednesday, Friday) What changed: Another medication with the same name was changed. Make sure you understand how and when to take each.   Klor-Con M15 15 MEQ tablet Generic drug: potassium chloride SA Take 1 tablet by mouth twice daily What changed: how much to take   ranolazine 500 MG 12 hr tablet Commonly known as: Ranexa Take 1 tablet (500 mg total) by mouth 2 (two) times  daily.   Vitamin D 50 MCG (2000 UT) Caps Take 1 tablet daily What changed:   how much to take  how to take this  when to take this  additional instructions      Allergies  Allergen Reactions  . Lisinopril Anaphylaxis  . Ranolazine Anaphylaxis    Dizzy, Reaction occurred with generic med  . Vicodin [Hydrocodone-Acetaminophen] Diarrhea and Nausea And Vomiting  . Amoxicillin Swelling  . Demerol [Meperidine] Nausea And Vomiting  . Lodine [Etodolac] Rash    Follow-up Information    C[place], [name], MD. Schedule an appointment as soon as possible for a visit in 10 day(s).   Specialty: Family Medicine Contact information: [address] [place] [address] [phone]               The results of significant diagnostics from this hospitalization (including imaging, microbiology, ancillary and laboratory) are listed below for reference.    Significant Diagnostic Studies: DG CHEST PORT 1 VIEW  Result Date: 09/16/2020 CLINICAL DATA:  Diarrhea, CHF exacerbation EXAM: PORTABLE CHEST 1 VIEW COMPARISON:  09/15/2020 FINDINGS: Cardiomegaly with pulmonary vascular congestion. No frank interstitial edema, improved. No pleural effusion or pneumothorax. IMPRESSION: Cardiomegaly with pulmonary vascular congestion. No  frank interstitial edema, improved. Electronically Signed   By: Julian Hy M.D.   On: 09/16/2020 05:17   DG Chest Portable 1 View  Result Date: 09/15/2020 CLINICAL DATA:  69 year old female with acute dyspnea. EXAM: PORTABLE CHEST 1 VIEW COMPARISON:  Chest radiograph dated 08/15/2009 and CT dated 01/14/2020. FINDINGS: There is cardiomegaly with vascular congestion and mild edema. Pneumonia is not excluded clinical correlation is recommended. No lobar consolidation, pleural effusion, pneumothorax. No acute osseous pathology. IMPRESSION: Cardiomegaly with findings of mild CHF.  Pneumonia is not excluded. Electronically Signed   By: Anner Crete M.D.   On: 09/15/2020 19:15   ECHOCARDIOGRAM COMPLETE  Result Date: 08/29/2020    ECHOCARDIOGRAM REPORT   Patient Name:   HELLEN SHANLEY Date of Exam: 08/29/2020 Medical Rec #:  [id number]    Height:       60.0 in Accession #:    [id number]   Weight:       219.0 lb Date of Birth:  06/06/1951     BSA:          1.940 m Patient Age:    89 years     BP:           144/91 mmHg Patient Gender: F            HR:           73 bpm. Exam Location:  [facility] Procedure: 2D Echo, Cardiac Doppler and Color Doppler Indications:    Congestive Heart Failure 428.0 / I50.9  History:        Patient has prior history of Echocardiogram examinations, most                 recent 01/14/2020. CHF, CAD and Previous Myocardial Infarction;                 Risk Factors:Dyslipidemia and Hypertension. H/o Persistent                 atrial fibrillation, Obesity.  Sonographer:    Alvino Chapel RCS Referring Phys: [place]  1. Left ventricular ejection fraction, by estimation, is 40 to 45%. The left ventricle has mildly decreased function. The left ventricle demonstrates global hypokinesis. There is severe  left ventricular hypertrophy. Left ventricular diastolic parameters  are indeterminate.  2. Right ventricular systolic function is normal. The right ventricular size is  normal. There is moderately elevated pulmonary artery systolic pressure.  3. Left atrial size was severely dilated.  4. Right atrial size was severely dilated.  5. The mitral valve is normal in structure. Mild to moderate mitral valve regurgitation. No evidence of mitral stenosis.  6. Tricuspid valve regurgitation is moderate.  7. The aortic valve is tricuspid. Aortic valve regurgitation is mild to moderate.  8. Moderate pulmonary HTN, PASP is 50 mmHg.  9. The inferior vena cava is dilated in size with <50% respiratory variability, suggesting right atrial pressure of 15 mmHg. FINDINGS  Left Ventricle: Left ventricular ejection fraction, by estimation, is 40 to 45%. The left ventricle has mildly decreased function. The left ventricle demonstrates global hypokinesis. The left ventricular internal cavity size was normal in size. There is  severe left ventricular hypertrophy. Left ventricular diastolic parameters are indeterminate. Right Ventricle: The right ventricular size is normal. No increase in right ventricular wall thickness. Right ventricular systolic function is normal. There is moderately elevated pulmonary artery systolic pressure. The tricuspid regurgitant velocity is 2.98 m/s, and with an assumed right atrial pressure of 15 mmHg, the estimated right ventricular systolic pressure is 26.8 mmHg. Left Atrium: Left atrial size was severely dilated. Right Atrium: Right atrial size was severely dilated. Pericardium: There is no evidence of pericardial effusion. Mitral Valve: The mitral valve is normal in structure. Mild to moderate mitral valve regurgitation. No evidence of mitral valve stenosis. Tricuspid Valve: The tricuspid valve is not well visualized. Tricuspid valve regurgitation is moderate . No evidence of tricuspid stenosis. Aortic Valve: The aortic valve is tricuspid. Aortic valve regurgitation is mild to moderate. Aortic regurgitation PHT measures 477 msec. Aortic valve mean gradient measures 2.0 mmHg.  Aortic valve peak gradient measures 2.7 mmHg. Aortic valve area, by VTI  measures 2.07 cm. Pulmonic Valve: The pulmonic valve was not well visualized. Pulmonic valve regurgitation is mild. No evidence of pulmonic stenosis. Aorta: The aortic root is normal in size and structure. Pulmonary Artery: Moderate pulmonary HTN, PASP is 50 mmHg. Venous: The inferior vena cava is dilated in size with less than 50% respiratory variability, suggesting right atrial pressure of 15 mmHg. IAS/Shunts: No atrial level shunt detected by color flow Doppler.  LEFT VENTRICLE PLAX 2D LVIDd:         5.33 cm LVIDs:         4.31 cm LV PW:         1.54 cm LV IVS:        1.50 cm LVOT diam:     2.00 cm LV SV:         32 LV SV Index:   17 LVOT Area:     3.14 cm  LV Volumes (MOD) LV vol d, MOD A2C: 122.0 ml LV vol d, MOD A4C: 81.8 ml LV vol s, MOD A2C: 65.9 ml LV vol s, MOD A4C: 42.8 ml LV SV MOD A2C:     56.1 ml LV SV MOD A4C:     81.8 ml LV SV MOD BP:      45.5 ml LEFT ATRIUM              Index       RIGHT ATRIUM           Index LA diam:        4.70 cm  2.42 cm/m  RA Area:     34.40 cm LA Vol (A2C):   172.0 ml 88.66 ml/m RA Volume:   127.00 ml 65.46 ml/m LA Vol (A4C):   171.0 ml 88.14 ml/m LA Biplane Vol: 188.0 ml 96.91 ml/m  AORTIC VALVE AV Area (Vmax):    2.37 cm AV Area (Vmean):   2.15 cm AV Area (VTI):     2.07 cm AV Vmax:           82.50 cm/s AV Vmean:          58.300 cm/s AV VTI:            0.157 m AV Peak Grad:      2.7 mmHg AV Mean Grad:      2.0 mmHg LVOT Vmax:         62.15 cm/s LVOT Vmean:        39.950 cm/s LVOT VTI:          0.103 m LVOT/AV VTI ratio: 0.66 AI PHT:            477 msec  AORTA Ao Root diam: 3.30 cm MITRAL VALVE                 TRICUSPID VALVE MV Area (PHT): 5.70 cm      TR Peak grad:   35.5 mmHg MV Decel Time: 133 msec      TR Vmax:        298.00 cm/s MR Peak grad:    113.2 mmHg MR Mean grad:    77.0 mmHg   SHUNTS MR Vmax:         532.00 cm/s Systemic VTI:  0.10 m MR Vmean:        411.0 cm/s  Systemic Diam:  2.00 cm MR PISA:         3.08 cm MR PISA Eff ROA: 18 mm MR PISA Radius:  0.70 cm MV E velocity: 120.00 cm/s Carlyle Dolly MD Electronically signed by Carlyle Dolly MD Signature Date/Time: 08/29/2020/4:16:04 PM    Final     Microbiology: Recent Results (from the past 240 hour(s))  SARS CORONAVIRUS 2 (TAT 6-24 HRS) Nasopharyngeal Nasopharyngeal Swab     Status: None   Collection Time: 09/12/20 11:39 AM   Specimen: Nasopharyngeal Swab  Result Value Ref Range Status   SARS Coronavirus 2 NEGATIVE NEGATIVE Final    Comment: (NOTE) SARS-CoV-2 target nucleic acids are NOT DETECTED.  The SARS-CoV-2 RNA is generally detectable in upper and lower respiratory specimens during the acute phase of infection. Negative results do not preclude SARS-CoV-2 infection, do not rule out co-infections with other pathogens, and should not be used as the sole basis for treatment or other patient management decisions. Negative results must be combined with clinical observations, patient history, and epidemiological information. The expected result is Negative.  Fact Sheet for Patients: SugarRoll.be  Fact Sheet for Healthcare Providers: https://www.woods-mathews.com/  This test is not yet approved or cleared by the [place] FDA and  has been authorized for detection and/or diagnosis of SARS-CoV-2 by FDA under an Emergency Use Authorization (EUA). This EUA will remain  in effect (meaning this test can be used) for the duration of the COVID-19 declaration under Se ction 564(b)(1) of the Act, 21 U.S.C. section 360bbb-3(b)(1), unless the authorization is terminated or revoked sooner.  Performed at [facility], [place] [address], [place], [place] [address]   Respiratory Panel by RT PCR (Flu A&B, Covid) - Nasopharyngeal Swab     Status:  None   Collection Time: 09/15/20  8:15 PM   Specimen: Nasopharyngeal Swab  Result Value Ref Range Status   SARS  Coronavirus 2 by RT PCR NEGATIVE NEGATIVE Final    Comment: (NOTE) SARS-CoV-2 target nucleic acids are NOT DETECTED.  The SARS-CoV-2 RNA is generally detectable in upper respiratoy specimens during the acute phase of infection. The lowest concentration of SARS-CoV-2 viral copies this assay can detect is 131 copies/mL. A negative result does not preclude SARS-Cov-2 infection and should not be used as the sole basis for treatment or other patient management decisions. A negative result may occur with  improper specimen collection/handling, submission of specimen other than nasopharyngeal swab, presence of viral mutation(s) within the areas targeted by this assay, and inadequate number of viral copies (<131 copies/mL). A negative result must be combined with clinical observations, patient history, and epidemiological information. The expected result is Negative.  Fact Sheet for Patients:  PinkCheek.be  Fact Sheet for Healthcare Providers:  GravelBags.it  This test is no t yet approved or cleared by the [place] FDA and  has been authorized for detection and/or diagnosis of SARS-CoV-2 by FDA under an Emergency Use Authorization (EUA). This EUA will remain  in effect (meaning this test can be used) for the duration of the COVID-19 declaration under Section 564(b)(1) of the Act, 21 U.S.C. section 360bbb-3(b)(1), unless the authorization is terminated or revoked sooner.     Influenza A by PCR NEGATIVE NEGATIVE Final   Influenza B by PCR NEGATIVE NEGATIVE Final    Comment: (NOTE) The Xpert Xpress SARS-CoV-2/FLU/RSV assay is intended as an aid in  the diagnosis of influenza from Nasopharyngeal swab specimens and  should not be used as a sole basis for treatment. Nasal washings and  aspirates are unacceptable for Xpert Xpress SARS-CoV-2/FLU/RSV  testing.  Fact Sheet for  Patients: PinkCheek.be  Fact Sheet for Healthcare Providers: GravelBags.it  This test is not yet approved or cleared by the [place] FDA and  has been authorized for detection and/or diagnosis of SARS-CoV-2 by  FDA under an Emergency Use Authorization (EUA). This EUA will remain  in effect (meaning this test can be used) for the duration of the  Covid-19 declaration under Section 564(b)(1) of the Act, 21  U.S.C. section 360bbb-3(b)(1), unless the authorization is  terminated or revoked. Performed at [facility], [address] [place], [place] [address]      Labs: Basic Metabolic Panel: Recent Labs  Lab 09/12/20 1141 09/15/20 1626 09/16/20 0420 09/17/20 0804  NA 136 138 137 135  K 3.9 3.8 3.2* 3.5  CL 100 102 104 102  CO2 28 23 23 27   GLUCOSE 103* 99 107* 101*  BUN 14 16 14 9   CREATININE 1.13* 1.04* 0.98 0.93  CALCIUM 8.5* 8.9 8.1* 7.8*  MG  --   --  2.0  --    Liver Function Tests: Recent Labs  Lab 09/15/20 1626 09/16/20 0420  AST 45* 33  ALT 35 31  ALKPHOS 88 81  BILITOT 0.7 0.9  PROT 7.5 7.0  ALBUMIN 3.3* 3.2*   CBC: Recent Labs  Lab 09/12/20 1141 09/15/20 1626 09/16/20 0420 09/16/20 1313 09/17/20 0804  WBC 5.6 6.8 7.1 7.4 7.4  NEUTROABS 3.2 4.4  --   --   --   HGB 10.9* 10.3* 10.3* 9.8* 10.7*  HCT 33.8* 31.8* 32.8* 30.9* 33.5*  MCV 88.5 89.3 91.4 90.1 89.8  PLT 295 260 250 243 262   BNP (last 3 results) Recent  Labs    12/10/19 0953 01/14/20 0437 09/15/20 1955  BNP 171* 556.0* 1,278.0*    Signed:  Barton Dubois MD.  [facility] 09/17/2020, 4:11 PM

## 2020-09-17 NOTE — Progress Notes (Signed)
Nsg Discharge Note  Admit Date:  09/15/2020 Discharge date: 09/17/2020   [name] to be D/C'd Home per MD order.  AVS completed.  Copy for chart, and copy for patient signed, and dated. Removed IV-clean, dry, intact. Reviewed d/c paperwork with patient and daughter. Answered all questions. Wheeled stable patient and belongings to main entrance where she was picked up by her daughter. Patient/caregiver able to verbalize understanding.  Discharge Medication: Allergies as of 09/17/2020      Reactions   Lisinopril Anaphylaxis   Ranolazine Anaphylaxis   Dizzy, Reaction occurred with generic med   Vicodin [hydrocodone-acetaminophen] Diarrhea, Nausea And Vomiting   Amoxicillin Swelling   Demerol [meperidine] Nausea And Vomiting   Lodine [etodolac] Rash      Medication List    TAKE these medications   acetaminophen 500 MG tablet Commonly known as: TYLENOL Take 500 mg by mouth every 6 (six) hours as needed for mild pain or moderate pain. Reported on 04/22/2016   apixaban 5 MG Tabs tablet Commonly known as: Eliquis Take 1 tablet (5 mg total) by mouth 2 (two) times daily.   atorvastatin 80 MG tablet Commonly known as: LIPITOR Take 1 tablet (80 mg total) by mouth at bedtime.   calcium carbonate 600 MG Tabs tablet Commonly known as: Calcium 600 Take 1 tablet (600 mg total) by mouth 2 (two) times daily with a meal.   ferrous sulfate 325 (65 FE) MG EC tablet Take 325 mg by mouth daily.   furosemide 40 MG tablet Commonly known as: Lasix Take 40 mg twice a day 4 days/week (Tuesday, Thursday, Saturday, Sunday) and take 40 mg  daily 3 days/week (Monday, Wednesday and Friday) What changed:   how much to take  how to take this  when to take this  additional instructions   gabapentin 300 MG capsule Commonly known as: NEURONTIN Take 300 mg by mouth 2 (two) times daily.   levothyroxine 100 MCG tablet Commonly known as: SYNTHROID TAKE 1 TABLET BY MOUTH ONCE DAILY BEFORE  BREAKFAST What changed: See the new instructions.   loratadine 10 MG tablet Commonly known as: CLARITIN Take 10 mg by mouth daily.   metoprolol tartrate 25 MG tablet Commonly known as: LOPRESSOR Take 1 tablet (25 mg total) by mouth 2 (two) times daily.   nitroGLYCERIN 0.4 MG SL tablet Commonly known as: NITROSTAT DISSOLVE ONE TABLET UNDER THE TONGUE EVERY 5 MINUTES AS NEEDED FOR CHEST PAIN.  DO NOT EXCEED A TOTAL OF 3 DOSES IN 15 MINUTES What changed:   how much to take  how to take this  when to take this  reasons to take this  additional instructions   ondansetron 4 MG disintegrating tablet Commonly known as: Zofran ODT 4mg  ODT q4 hours prn nausea/vomit What changed:   how much to take  how to take this  when to take this  reasons to take this  additional instructions   pantoprazole 40 MG tablet Commonly known as: PROTONIX Take 1 tablet (40 mg total) by mouth 2 (two) times daily.   potassium chloride 10 MEQ tablet Commonly known as: KLOR-CON Take 1 tablet (10 meq) 4 days/week (Tuesday, Thursday, Saturday, Sunday) and take 2 tablets (20 meq) 3 days/week (Monday, Wednesday, Friday) What changed: Another medication with the same name was changed. Make sure you understand how and when to take each.   Klor-Con M15 15 MEQ tablet Generic drug: potassium chloride SA Take 1 tablet by mouth twice daily What changed: how much to  take   ranolazine 500 MG 12 hr tablet Commonly known as: Ranexa Take 1 tablet (500 mg total) by mouth 2 (two) times daily.   Vitamin D 50 MCG (2000 UT) Caps Take 1 tablet daily What changed:   how much to take  how to take this  when to take this  additional instructions       Discharge Assessment: Vitals:   09/17/20 0932 09/17/20 0945  BP: 131/65 134/73  Pulse: 62 (!) 58  Resp: 13 13  Temp: 97.7 F (36.5 C)   SpO2: 100% 96%   Skin clean, dry and intact without evidence of skin break down, no evidence of skin tears  noted. IV catheter discontinued intact. Site without signs and symptoms of complications - no redness or edema noted at insertion site, patient denies c/o pain - only slight tenderness at site.  Dressing with slight pressure applied.  D/c Instructions-Education: Discharge instructions given to patient/family with verbalized understanding. D/c education completed with patient/family including follow up instructions, medication list, d/c activities limitations if indicated, with other d/c instructions as indicated by MD - patient able to verbalize understanding, all questions fully answered. Patient instructed to return to ED, call 911, or call MD for any changes in condition.  Patient escorted via [place], and D/C home via private auto.  Santa Lighter, RN 09/17/2020 5:00 PM

## 2020-09-17 NOTE — Anesthesia Postprocedure Evaluation (Signed)
Anesthesia Post Note  Patient: [name]  Procedure(s) Performed: ESOPHAGOGASTRODUODENOSCOPY (EGD) WITH PROPOFOL (N/A ) BIOPSY  Patient location during evaluation: PACU Anesthesia Type: General Level of consciousness: awake and alert and oriented Pain management: pain level controlled Vital Signs Assessment: post-procedure vital signs reviewed and stable Respiratory status: spontaneous breathing, respiratory function stable and patient connected to nasal cannula oxygen Cardiovascular status: blood pressure returned to baseline and stable Postop Assessment: no apparent nausea or vomiting Anesthetic complications: no   No complications documented.   Last Vitals:  Vitals:   09/17/20 0932 09/17/20 0945  BP: 131/65 134/73  Pulse: 62 (!) 58  Resp: 13 13  Temp: 36.5 C   SpO2: 100% 96%    Last Pain:  Vitals:   09/17/20 0932  TempSrc:   PainSc: 0-No pain                 Tamryn Popko C Joyell Emami

## 2020-09-17 NOTE — Plan of Care (Signed)

## 2020-09-17 NOTE — Progress Notes (Signed)
Patient underwent EGD this morning which showed gastritis and a few small erosions.  No obvious source of GI bleeding found.  I biopsied this to rule out infection with H. Pylori.  Okay to switch to p.o. PPI 40 mg twice daily.  I counseled patient to avoid NSAIDs.  Okay to resume Eliquis at this time.  Patient may start a soft diet.  Stable for discharge home from a GI standpoint.  She will need to follow-up with [place] in the outpatient setting.  We will likely need to update her colonoscopy at that time.  GI to sign off, please call with any questions or concerns.

## 2020-09-17 NOTE — Anesthesia Preprocedure Evaluation (Addendum)
Anesthesia Evaluation  Patient identified by MRN, date of birth, ID band Patient awake    Reviewed: Allergy & Precautions, NPO status , Patient's Chart, lab work & pertinent test results, reviewed documented beta blocker date and time   History of Anesthesia Complications (+) PONV and history of anesthetic complications  Airway Mallampati: III  TM Distance: >3 FB Neck ROM: Full    Dental  (+) Dental Advisory Given, Missing   Pulmonary shortness of breath and with exertion, former smoker,           Cardiovascular Exercise Tolerance: Poor hypertension, Pt. on medications and Pt. on home beta blockers + angina with exertion + CAD, + Past MI, +CHF and + DOE  Normal cardiovascular exam+ dysrhythmias Atrial Fibrillation  Rhythm:Regular Rate:Normal - Systolic murmurs, - Diastolic murmurs, - Carotid Bruit, - Peripheral Edema and - Systolic Click 1. Left ventricular ejection fraction, by estimation, is 40 to 45%. The left ventricle has mildly decreased function. The left ventricle demonstrates global hypokinesis. There is severe left ventricular hypertrophy. Left ventricular diastolic parameters are indeterminate.  2. Right ventricular systolic function is normal. The right ventricular size is normal. There is moderately elevated pulmonary artery systolic pressure.  3. Left atrial size was severely dilated.  4. Right atrial size was severely dilated.  5. The mitral valve is normal in structure. Mild to moderate mitral valve  regurgitation. No evidence of mitral stenosis.  6. Tricuspid valve regurgitation is moderate.  7. The aortic valve is tricuspid. Aortic valve regurgitation is mild to moderate.  8. Moderate pulmonary HTN, PASP is 50 mmHg.  9. The inferior vena cava is dilated in size with <50% respiratory variability, suggesting right atrial pressure of 15 mmHg.   2016 cath -   Dist LAD lesion, 100% stenosed.  Mid LAD lesion,  10% stenosed.  The left ventricular systolic function is normal.   1. 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 Good LV function  Plan: DAPT, beta blocker and risk factor modification    Neuro/Psych negative psych ROS   GI/Hepatic Neg liver ROS, Upper GI bleeding   Endo/Other  Hypothyroidism Morbid obesity  Renal/GU Renal InsufficiencyRenal disease     Musculoskeletal  (+) Arthritis ,   Abdominal   Peds  Hematology  (+) anemia ,   Anesthesia Other Findings   Reproductive/Obstetrics negative OB ROS                            Anesthesia Physical  Anesthesia Plan  ASA: IV  Anesthesia Plan: General   Post-op Pain Management:    Induction: Intravenous  PONV Risk Score and Plan: TIVA  Airway Management Planned: Nasal Cannula, Natural Airway and Simple Face Mask  Additional Equipment:   Intra-op Plan:   Post-operative Plan: Possible Post-op intubation/ventilation  Informed Consent: I have reviewed the patients History and Physical, chart, labs and discussed the procedure including the risks, benefits and alternatives for the proposed anesthesia with the patient or authorized representative who has indicated his/her understanding and acceptance.     Dental advisory given  Plan Discussed with: CRNA and Surgeon  Anesthesia Plan Comments:         Anesthesia Quick Evaluation

## 2020-09-17 NOTE — Interval H&P Note (Signed)
History and Physical Interval Note:  09/17/2020 8:47 AM  Maria Fry  has presented today for surgery, with the diagnosis of Melena.  The various methods of treatment have been discussed with the patient and family. After consideration of risks, benefits and other options for treatment, the patient has consented to  Procedure(s): ESOPHAGOGASTRODUODENOSCOPY (EGD) WITH PROPOFOL (N/A) as a surgical intervention.  The patient's history has been reviewed, patient examined, no change in status, stable for surgery.  I have reviewed the patient's chart and labs.  Questions were answered to the patient's satisfaction.     [name]

## 2020-09-17 NOTE — Plan of Care (Signed)
  Problem: Education: Goal: Knowledge of General Education information will improve Description: Including pain rating scale, medication(s)/side effects and non-pharmacologic comfort measures 09/17/2020 1602 by Santa Lighter, RN Outcome: Adequate for Discharge 09/17/2020 1508 by Santa Lighter, RN Outcome: Progressing   Problem: Health Behavior/Discharge Planning: Goal: Ability to manage health-related needs will improve 09/17/2020 1602 by Santa Lighter, RN Outcome: Adequate for Discharge 09/17/2020 1508 by Santa Lighter, RN Outcome: Progressing   Problem: Clinical Measurements: Goal: Ability to maintain clinical measurements within normal limits will improve 09/17/2020 1602 by Santa Lighter, RN Outcome: Adequate for Discharge 09/17/2020 1508 by Santa Lighter, RN Outcome: Progressing Goal: Will remain free from infection 09/17/2020 1602 by Santa Lighter, RN Outcome: Adequate for Discharge 09/17/2020 1508 by Santa Lighter, RN Outcome: Progressing Goal: Diagnostic test results will improve 09/17/2020 1602 by Santa Lighter, RN Outcome: Adequate for Discharge 09/17/2020 1508 by Santa Lighter, RN Outcome: Progressing Goal: Respiratory complications will improve Outcome: Adequate for Discharge Goal: Cardiovascular complication will be avoided Outcome: Adequate for Discharge   Problem: Activity: Goal: Risk for activity intolerance will decrease Outcome: Adequate for Discharge   Problem: Nutrition: Goal: Adequate nutrition will be maintained Outcome: Adequate for Discharge   Problem: Coping: Goal: Level of anxiety will decrease Outcome: Adequate for Discharge   Problem: Elimination: Goal: Will not experience complications related to bowel motility Outcome: Adequate for Discharge Goal: Will not experience complications related to urinary retention Outcome: Adequate for Discharge   Problem: Pain Managment: Goal: General experience of comfort will  improve Outcome: Adequate for Discharge   Problem: Safety: Goal: Ability to remain free from injury will improve Outcome: Adequate for Discharge   Problem: Skin Integrity: Goal: Risk for impaired skin integrity will decrease Outcome: Adequate for Discharge

## 2020-09-17 NOTE — Transfer of Care (Signed)
Immediate Anesthesia Transfer of Care Note  Patient: Maria Fry  Procedure(s) Performed: ESOPHAGOGASTRODUODENOSCOPY (EGD) WITH PROPOFOL (N/A ) BIOPSY  Patient Location: PACU  Anesthesia Type:General  Level of Consciousness: awake, alert , oriented and sedated  Airway & Oxygen Therapy: Patient Spontanous Breathing and Patient connected to nasal cannula oxygen  Post-op Assessment: Report given to RN and Post -op Vital signs reviewed and stable  Post vital signs: Reviewed and stable  Last Vitals:  Vitals Value Taken Time  BP 131/65 09/17/20 0933  Temp 36.5 C 09/17/20 0932  Pulse 61 09/17/20 0937  Resp 17 09/17/20 0937  SpO2 97 % 09/17/20 0937  Vitals shown include unvalidated device data.  Last Pain:  Vitals:   09/17/20 0932  TempSrc:   PainSc: 0-No pain         Complications: No complications documented.

## 2020-09-18 ENCOUNTER — Encounter (HOSPITAL_COMMUNITY): Payer: Self-pay | Admitting: Internal Medicine

## 2020-09-18 ENCOUNTER — Telehealth: Payer: Self-pay

## 2020-09-18 NOTE — Telephone Encounter (Signed)
-----   Message from [name], DO sent at 09/17/2020  9:39 AM EDT ----- Patient is likely getting discharged from hospital today or tomorrow.  Can we call and set her up for follow-up appointment with one of the APPs in 4 to 6 weeks.  Thank you  Abbey Chatters

## 2020-09-18 NOTE — Telephone Encounter (Signed)
Routing message to [place], [place]

## 2020-09-19 ENCOUNTER — Encounter: Payer: Self-pay | Admitting: Gastroenterology

## 2020-09-19 ENCOUNTER — Other Ambulatory Visit: Payer: Self-pay

## 2020-09-19 LAB — SURGICAL PATHOLOGY

## 2020-09-19 NOTE — Telephone Encounter (Signed)
Routing to [place] to arrange apt.

## 2020-09-19 NOTE — Telephone Encounter (Signed)
Noted  

## 2020-09-19 NOTE — Telephone Encounter (Signed)
PATIENT SCHEDULED AND LETTER SENT  °

## 2020-09-20 ENCOUNTER — Other Ambulatory Visit: Payer: Self-pay

## 2020-09-25 ENCOUNTER — Telehealth: Payer: Self-pay | Admitting: *Deleted

## 2020-09-25 NOTE — Telephone Encounter (Signed)
-----   Message from Satira Sark, MD sent at 09/13/2020  8:00 AM EDT ----- Results reviewed. Negative SARS coronavirus 2 test prior to cardioversion.

## 2020-09-25 NOTE — Telephone Encounter (Signed)
Pt made aware last week prior to cardioversion

## 2020-10-01 NOTE — Progress Notes (Addendum)
Cardiology Office Note  Date: 10/02/2020   ID: Maria Fry, DOB [birth year], MRN [id number]  PCP:  Alycia Rossetti, MD  Cardiologist:  No primary care provider on file. Electrophysiologist:  None   Chief Complaint: Follow-up CAD, CHF, HTN, HLD New AFIB  History of Present Illness: Maria Fry is a 69 y.o. female with a history of CAD, chronic combined systolic and diastolic heart failure, HTN, HLD, hypothyroidism. (New diagnosis of atrial fibrillation at 08/18/2020 visit).  Previously seen by Rosaria Ferries, PA for 38-month follow-up.  She complained of problems with daytime lower extremity edema.  Her current Lasix schedule was keeping most of her fluid off.  Her weight has been stable between 215 and 220 pounds.  She stated the fluid accumulated when she sits and did not put her feet up.  Was having some problems with fatigue becoming extremely tired with just taking out the trash.  Brief chest pains at different areas resolving spontaneously.  Tended to be sharp. She c/o of waking in the night and feeling SOB, and falling  asleep during the day easily. Stated she was never told she had an irregular heart rhythm. Repeat echocardiogram was ordered. Sleep study was recommended. She was started on Eliquis 5 mg po bid. A TEE cardioversion was suggested but patient wanted to wait until she had been anticoagulated x 3 and undergo DCCV. She was continuing her metoprolol. She was compliant with her synthroid. Not having CAD symptoms. She was continuing ASA, statin and BB. TSH, CBC, and BMET were ordered.  Last seen by me on September 04, 2020.  She is still complaining of some dyspnea on exertion.  Heart rate was 74 and irregularly irregular.  EKG showed atrial fibrillation with rate of 73 she denied any orthostatic symptoms, PND, orthopnea, CVA or TIA-like symptoms, bleeding issues.  She did have some mild lower extremity edema resolving after lying flat during the night.  Plans were to proceed with DC  cardioversion   Patient had DC cardioversion on 09/14/2020 with a single synchronized 120 J shock conversion from atrial fib to atrial flutter with variable conduction.  A second synchronized 150 J shock was given with successful restoration of sinus rhythm.  Patient presented to [facility] on 09/15/2020 with complaints of black diarrhea-like stool.  Patient went to urgent care and had had a Hemoccult positive stool.  Patient was advised to go to the emergency room.  Hemoglobin was stable at 10.3.  BNP was 1278.  Chest x-ray findings of mild CHF.  She was given IV Lasix and placed on 2 L nasal cannula due to decreased O2 saturations.  She had excellent response to IV diuretics.  She had an EGD demonstrating erosion and gastritis without acute bleeding.  GI service recommended use of Protonix twice a day and stated it was safe to resume Eliquis.  She was to follow-up with GI as outpatient for colonoscopy.  She remained in sinus rhythm during the hospital stay.  She presents today for 1 month follow-up status post DC cardioversion.  It appears she has reverted back to atrial fibrillation with a controlled rate.  EKG today: Atrial fibrillation with incomplete left bundle branch block, ST and to have very mild to consider inferolateral ischemia.  Rate of 73.  Patient states she notices palpitations and increased shortness of breath some days when she goes to the grocery stores and on other days she does not notice shortness of breath.  Otherwise she denies any anginal  or exertional symptoms, orthostatic symptoms, CVA or TIA-like symptoms, lightheadedness, dizziness, presyncopal [place] for episode.  Orthopnea, PND.  States she stopped aspirin due to dark tarry looking stools.  States stools are starting to clear up.  She continues on Eliquis.   Past Medical History:  Diagnosis Date   CAD (coronary artery disease)    a. 06/07/15 NSTEMI/Cath: Dist LAD 100%, mid LAD 10%. Otw nl cors. Nl EF w/ inferoapical AK.    Chronic combined systolic (congestive) and diastolic (congestive) heart failure ([place]) 04/23/2016   a. 04/2016 Echo: EF 45%, Gr2 DD; b. 11/2019 Echo: EF 45-50%, mod LVH. Gr2 DD. Nl RV fxn. Sev dil LA.    Complication of anesthesia    Essential hypertension    Hip pain    Hyperlipidemia    Hypothyroidism    MI (myocardial infarction) ([place]) 06/05/2015   PONV (postoperative nausea and vomiting)    Vitamin D deficiency     Past Surgical History:  Procedure Laterality Date   ABDOMINAL HYSTERECTOMY     BIOPSY  09/17/2020   Procedure: BIOPSY;  Surgeon: [name], DO;  Location: [facility];  Service: Endoscopy;;   CARDIAC CATHETERIZATION N/A 06/06/2015   Procedure: Left Heart Cath and Coronary Angiography;  Surgeon: Peter M Martinique, MD;  Location: [facility] [place] [facility];  Service: Cardiovascular;  Laterality: N/A;   CARDIOVERSION N/A 09/14/2020   Procedure: CARDIOVERSION;  Surgeon: Satira Sark, MD;  Location: [facility];  Service: Cardiovascular;  Laterality: N/A;   CHOLECYSTECTOMY     COLONOSCOPY     COLONOSCOPY N/A 08/13/2017   Procedure: COLONOSCOPY;  Surgeon: Daneil Dolin, MD;  Location: [facility];  Service: Endoscopy;  Laterality: N/A;  8:30 AM   ESOPHAGOGASTRODUODENOSCOPY N/A 09/11/2016   Procedure: ESOPHAGOGASTRODUODENOSCOPY (EGD);  Surgeon: Daneil Dolin, MD;  Location: [facility];  Service: Endoscopy;  Laterality: N/A;  7:45 am - moved to 10/11 @ 10:30   ESOPHAGOGASTRODUODENOSCOPY (EGD) WITH PROPOFOL N/A 09/17/2020   Procedure: ESOPHAGOGASTRODUODENOSCOPY (EGD) WITH PROPOFOL;  Surgeon: [name], DO;  Location: [facility];  Service: Endoscopy;  Laterality: N/A;   Heel spurs     MALONEY DILATION N/A 09/11/2016   Procedure: Venia Minks DILATION;  Surgeon: Daneil Dolin, MD;  Location: [facility];  Service: Endoscopy;  Laterality: N/A;   POLYPECTOMY  08/13/2017   Procedure: POLYPECTOMY;  Surgeon: Daneil Dolin, MD;  Location[facility];  Service: Endoscopy;;  colon   SHOULDER ARTHROSCOPY     TUBAL LIGATION      Current Outpatient Medications  Medication Sig Dispense Refill   acetaminophen (TYLENOL) 500 MG tablet Take 500 mg by mouth every 6 (six) hours as needed for mild pain or moderate pain. Reported on 04/22/2016     apixaban (ELIQUIS) 5 MG TABS tablet Take 1 tablet (5 mg total) by mouth 2 (two) times daily. 60 tablet 11   atorvastatin (LIPITOR) 80 MG tablet Take 1 tablet (80 mg total) by mouth at bedtime. 90 tablet 3   calcium carbonate (CALCIUM 600) 600 MG TABS tablet Take 1 tablet (600 mg total) by mouth 2 (two) times daily with a meal. 60 tablet 6   Cholecalciferol (VITAMIN D) 50 MCG (2000 UT) CAPS Take 1 tablet daily (Patient taking differently: Take 2,000 Units by mouth daily. ) 30 capsule 2   ferrous sulfate 325 (65 FE) MG EC tablet Take 325 mg by mouth daily.     furosemide (LASIX) 40 MG tablet  Take 40 mg twice a day 4 days/week (Tuesday, Thursday, Saturday, Sunday) and take 40 mg  daily 3 days/week (Monday, Wednesday and Friday) (Patient taking differently: Take 40 mg by mouth See admin instructions. Take 40 mg by mouth once daily on Tuesday, Thursday, Saturday, Sunday and take 40 mg  Twice daily on Monday, Wednesday and Friday) 120 tablet 6   gabapentin (NEURONTIN) 300 MG capsule Take 300 mg by mouth 2 (two) times daily.      KLOR-CON M15 15 MEQ tablet Take 1 tablet by mouth twice daily (Patient taking differently: Take 15 mEq by mouth 2 (two) times daily. ) 180 tablet 3   levothyroxine (SYNTHROID) 100 MCG tablet TAKE 1 TABLET BY MOUTH ONCE DAILY BEFORE BREAKFAST (Patient taking differently: Take 100 mcg by mouth daily before breakfast. ) 90 tablet 2   loratadine (CLARITIN) 10 MG tablet Take 10 mg by mouth daily.      metoprolol tartrate (LOPRESSOR) 25 MG tablet Take 1 tablet (25 mg total) by mouth 2 (two) times daily. 180 tablet 3   nitroGLYCERIN (NITROSTAT) 0.4 MG SL tablet DISSOLVE ONE TABLET  UNDER THE TONGUE EVERY 5 MINUTES AS NEEDED FOR CHEST PAIN.  DO NOT EXCEED A TOTAL OF 3 DOSES IN 15 MINUTES (Patient taking differently: Place 0.4 mg under the tongue every 5 (five) minutes as needed for chest pain. ) 25 tablet 3   pantoprazole (PROTONIX) 40 MG tablet Take 1 tablet (40 mg total) by mouth 2 (two) times daily. 60 tablet 1   potassium chloride (KLOR-CON) 10 MEQ tablet Take 1 tablet (10 meq) 4 days/week (Tuesday, Thursday, Saturday, Sunday) and take 2 tablets (20 meq) 3 days/week (Monday, Wednesday, Friday) 40 tablet 0   ranolazine (RANEXA) 500 MG 12 hr tablet Take 1 tablet (500 mg total) by mouth 2 (two) times daily. 60 tablet 2   No current facility-administered medications for this visit.   Allergies:  Lisinopril, Ranolazine, Vicodin [hydrocodone-acetaminophen], Amoxicillin, Demerol [meperidine], and Lodine [etodolac]   Social History: The patient  reports that she quit smoking about 17 years ago. Her smoking use included cigarettes. She started smoking about 42 years ago. She has never used smokeless tobacco. She reports that she does not drink alcohol and does not use drugs.   Family History: The patient's family history includes Cancer in her brother; Heart attack in her brother and father; Heart failure in her brother and father; Stroke in her sister.   ROS:  Please see the history of present illness. Otherwise, complete review of systems is positive for none.  All other systems are reviewed and negative.   Physical Exam: VS:  Ht 5' (1.524 m)    Wt 218 lb (98.9 kg)    BMI 42.58 kg/m , BMI Body mass index is 42.58 kg/m.  Wt Readings from Last 3 Encounters:  10/02/20 218 lb (98.9 kg)  09/17/20 205 lb 0.4 oz (93 kg)  09/14/20 205 lb 0.4 oz (93 kg)    General: Patient appears comfortable at rest. Neck: Supple, no elevated JVP or carotid bruits, no thyromegaly. Lungs: Clear to auscultation, nonlabored breathing at rest. Cardiac: Irregularly irregular rate and rhythm, no  S3 or significant systolic murmur, no pericardial rub. Extremities: No pitting edema, distal pulses 2+. Skin: Warm and dry. Musculoskeletal: No kyphosis. Neuropsychiatric: Alert and oriented x3, affect grossly appropriate.  ECG:  An ECG dated 10/02/2020 was personally reviewed today and demonstrated:  Atrial fibrillation with incomplete left bundle branch block rate of 73 ST and T  wave abnormality consider inferior lateral ischemia  Recent Labwork: 09/15/2020: B Natriuretic Peptide 1,278.0 09/16/2020: ALT 31; AST 33; Magnesium 2.0; TSH 3.395 09/17/2020: BUN 9; Creatinine, Ser 0.93; Hemoglobin 10.7; Platelets 262; Potassium 3.5; Sodium 135     Component Value Date/Time   CHOL 123 01/03/2020 0948   TRIG 68 01/03/2020 0948   HDL 46 (L) 01/03/2020 0948   CHOLHDL 2.7 01/03/2020 0948   VLDL 12 06/23/2017 0902   LDLCALC 63 01/03/2020 0948    Other Studies Reviewed Today:  Echocardiogram 08/29/2020 1. Left ventricular ejection fraction, by estimation, is 40 to 45%. The left ventricle has mildly decreased function. The left ventricle demonstrates global hypokinesis. There is severe left ventricular hypertrophy. Left ventricular diastolic parameters are indeterminate. 2. Right ventricular systolic function is normal. The right ventricular size is normal. There is moderately elevated pulmonary artery systolic pressure. 3. Left atrial size was severely dilated. 4. Right atrial size was severely dilated. 5. The mitral valve is normal in structure. Mild to moderate mitral valve regurgitation. No evidence of mitral stenosis. 6. Tricuspid valve regurgitation is moderate. 7. The aortic valve is tricuspid. Aortic valve regurgitation is mild to moderate. 8. Moderate pulmonary HTN, PASP is 50 mmHg. 9. The inferior vena cava is dilated in size with <50% respiratory variability, suggesting right atrial pressure of 15 mmHg.   ECHO: 01/14/2020 1. Left ventricular ejection fraction, by estimation, is  60 to 65%. The  left ventricle has normal function. The left ventricle has no regional  wall motion abnormalities. There is severely increased left ventricular  hypertrophy. Left ventricular  diastolic parameters are consistent with Grade I diastolic dysfunction  (impaired relaxation).  2. Right ventricular systolic function is normal. The right ventricular  size is normal. There is mildly elevated pulmonary artery systolic  pressure.  3. Left atrial size was severely dilated.  4. Right atrial size was severely dilated.  5. The mitral valve is normal in structure and function. Mild mitral  valve regurgitation. No evidence of mitral stenosis.  6. The aortic valve is tricuspid. Aortic valve regurgitation is not  visualized. No aortic stenosis is present.  7. The inferior vena cava is normal in size with <50% respiratory  variability, suggesting right atrial pressure of 8 mmHg.   CATH: 06/06/2015  Dist LAD lesion, 100% stenosed.  Mid LAD lesion, 10% stenosed.  The left ventricular systolic function is normal.  1. 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 Good LV function  Plan: DAPT, beta blocker and risk factor modification.   Assessment and Plan:   1. Chronic combined systolic (congestive) and diastolic (congestive) heart failure (HCC) Recent echocardiogram on 08/09/2020 showed EF 40 to 45%, LV global hypokinesis, severe LVH, moderately elevated pulmonary artery systolic pressure with a PASP of 50 mmHg.  Severe LA dilation, severe RA dilation, mild to moderate MR, moderate TR.  AR mild to moderate.  Continue furosemide 40 mg twice a day Tuesday, Thursday, Saturday.  Take 40 mg 3 days/week on Mondays, Wednesdays and Fridays.  Continue metoprolol 25 mg p.o. twice daily.  2. Atrial fibrillation, unspecified type ([place]) Had a recent cardioversion on 09/04/2020 and after 2 shocks converted to normal sinus rhythm.  EKG today on arrival shows she has converted  back to atrial fibrillation rate of 73, incomplete left bundle branch block.  ST and T wave abnormality consider inferior lateral ischemia.  Patient would like definitive therapy to resolve atrial fibrillation.  Spoke with Dr. Domenic Polite regarding her case.  He  states is not likely she would convert back to normal sinus rhythm by doing a second cardioversion and/or RF ablation.  Spoke to the patient about referral to atrial fibrillation clinic for possible medical therapy to convert and explore other possible therapies.  She has stopped her aspirin due to black tarry stools which she states have cleared up since stopping the aspirin.  Continue Eliquis 5 mg p.o. twice daily.  Continue metoprolol 25 mg p.o. twice daily.  3. Suspected sleep apnea Please refer to pulmonology for sleep apnea suspicion.  A further referral was made at last visit but appears no one has contacted her.  We will follow-up to make sure she has an appointment for sleep study  4. CAD in native artery She denies any anginal or exertional symptoms.  Continue nitroglycerin sublingual as needed.  Continue metoprolol 25 mg p.o. twice daily.  Continue atorvastatin 80 mg p.o. twice daily.  5. Hypothyroidism, unspecified type Continue Synthroid 100 mcg p.o. twice daily  Medication Adjustments/Labs and Tests Ordered: Current medicines are reviewed at length with the patient today.  Concerns regarding medicines are outlined above.   Disposition: Follow-up with Dr. Domenic Polite or [facility] 3 months  Signed, Levell July, NP 10/02/2020 9:03 AM    [facility] [place], [place], [place] [address] Phone: [phone]; Fax: [phone]

## 2020-10-02 ENCOUNTER — Encounter: Payer: Self-pay | Admitting: Family Medicine

## 2020-10-02 ENCOUNTER — Ambulatory Visit: Payer: PPO | Admitting: Family Medicine

## 2020-10-02 VITALS — BP 138/88 | HR 73 | Ht 60.0 in | Wt 218.0 lb

## 2020-10-02 DIAGNOSIS — I2510 Atherosclerotic heart disease of native coronary artery without angina pectoris: Secondary | ICD-10-CM | POA: Diagnosis not present

## 2020-10-02 DIAGNOSIS — E039 Hypothyroidism, unspecified: Secondary | ICD-10-CM

## 2020-10-02 DIAGNOSIS — I4891 Unspecified atrial fibrillation: Secondary | ICD-10-CM

## 2020-10-02 DIAGNOSIS — R29818 Other symptoms and signs involving the nervous system: Secondary | ICD-10-CM

## 2020-10-02 DIAGNOSIS — I5042 Chronic combined systolic (congestive) and diastolic (congestive) heart failure: Secondary | ICD-10-CM | POA: Diagnosis not present

## 2020-10-02 NOTE — Patient Instructions (Addendum)
Medication Instructions:  Continue all current medications.  Labwork: none  Testing/Procedures: none  Follow-Up: 3 months   Any Other Special Instructions Will Be Listed Below (If Applicable).  You have been referred to:  AFib Clinic  Please call [facility] back to get sleep study scheduled - [phone].   If you need a refill on your cardiac medications before your next appointment, please call your pharmacy.

## 2020-10-05 ENCOUNTER — Other Ambulatory Visit: Payer: Self-pay

## 2020-10-05 ENCOUNTER — Ambulatory Visit (HOSPITAL_COMMUNITY)
Admission: RE | Admit: 2020-10-05 | Discharge: 2020-10-05 | Disposition: A | Discharge status: Home / Self Care | Payer: PPO | Source: Ambulatory Visit | Attending: Physician Assistant | Admitting: Physician Assistant

## 2020-10-05 ENCOUNTER — Encounter (HOSPITAL_COMMUNITY): Payer: Self-pay | Admitting: Physician Assistant

## 2020-10-05 ENCOUNTER — Telehealth: Payer: Self-pay | Admitting: Pharmacist

## 2020-10-05 VITALS — BP 142/90 | HR 75 | Ht 60.0 in | Wt 215.4 lb

## 2020-10-05 DIAGNOSIS — I2510 Atherosclerotic heart disease of native coronary artery without angina pectoris: Secondary | ICD-10-CM | POA: Insufficient documentation

## 2020-10-05 DIAGNOSIS — D6869 Other thrombophilia: Secondary | ICD-10-CM | POA: Insufficient documentation

## 2020-10-05 DIAGNOSIS — I5042 Chronic combined systolic (congestive) and diastolic (congestive) heart failure: Secondary | ICD-10-CM | POA: Diagnosis not present

## 2020-10-05 DIAGNOSIS — E669 Obesity, unspecified: Secondary | ICD-10-CM | POA: Insufficient documentation

## 2020-10-05 DIAGNOSIS — I4819 Other persistent atrial fibrillation: Secondary | ICD-10-CM | POA: Insufficient documentation

## 2020-10-05 DIAGNOSIS — Z87891 Personal history of nicotine dependence: Secondary | ICD-10-CM | POA: Diagnosis not present

## 2020-10-05 DIAGNOSIS — I110 Hypertensive heart disease with heart failure: Secondary | ICD-10-CM | POA: Diagnosis not present

## 2020-10-05 DIAGNOSIS — Z6841 Body Mass Index (BMI) 40.0 and over, adult: Secondary | ICD-10-CM | POA: Diagnosis not present

## 2020-10-05 LAB — BASIC METABOLIC PANEL
Anion gap: 8 (ref 5–15)
BUN: 14 mg/dL (ref 8–23)
CO2: 26 mmol/L (ref 22–32)
Calcium: 8.4 mg/dL — ABNORMAL LOW (ref 8.9–10.3)
Chloride: 105 mmol/L (ref 98–111)
Creatinine, Ser: 1.1 mg/dL — ABNORMAL HIGH (ref 0.44–1.00)
GFR, Estimated: 55 mL/min — ABNORMAL LOW (ref >60)
Glucose, Bld: 108 mg/dL — ABNORMAL HIGH (ref 70–99)
Potassium: 3.7 mmol/L (ref 3.5–5.1)
Sodium: 139 mmol/L (ref 135–145)

## 2020-10-05 NOTE — Progress Notes (Signed)
Primary Care Physician: Alycia Rossetti, MD Primary Cardiologist: Dr Bronson Ing (former) Primary Electrophysiologist: none Referring Physician: [name] is a 69 y.o. female with a history of CAD, chronic combined systolic and diastolic CHF, HLD, HTN, hypothyroidism, and persistent atrial fibrillation who presents for consultation in the [facility]. The patient was initially diagnosed with atrial fibrillation 08/18/20 at a routine follow up with Maria Fry. She was having symptoms of increased fatigue. She was started on Eliquis for a CHADS2VASC score of 5. She underwent DCCV on 09/14/20. She was hospitalized 10/15-10/17/21 for acute CHF and black stools. Workup revealed gastritis and her Eliquis was temporarily held. She resumed 09/17/20. Her ASA was discontinued and she has not had recurrence of bleeding. She was back in afib on follow up 10/02/20. She reports that she did feel better in SR with more energy and less SOB.   Today, she denies symptoms of palpitations, chest pain, orthopnea, PND, dizziness, presyncope, syncope, bleeding, or neurologic sequela. The patient is tolerating medications without difficulties and is otherwise without complaint today.    Atrial Fibrillation Risk Factors:  she does have symptoms or diagnosis of sleep apnea. Sleep consult with Dr Halford Chessman pending. she does not have a history of rheumatic fever.   she has a BMI of Body mass index is 42.07 kg/m.Marland Kitchen Filed Weights   10/05/20 0930  Weight: 97.7 kg    Family History  Problem Relation Age of Onset   Heart failure Father        Deceased   Heart attack Father        Deceased   Stroke Sister        Deceased   Heart failure Brother        Deceased   Cancer Brother        Deceased   Heart attack Brother        Deceased   Colon cancer Neg Hx      Atrial Fibrillation Management history:  Previous antiarrhythmic drugs: none Previous  cardioversions: 09/14/20 Previous ablations: none CHADS2VASC score: 5 Anticoagulation history: Eliquis   Past Medical History:  Diagnosis Date   CAD (coronary artery disease)    a. 06/07/15 NSTEMI/Cath: Dist LAD 100%, mid LAD 10%. Otw nl cors. Nl EF w/ inferoapical AK.   Chronic combined systolic (congestive) and diastolic (congestive) heart failure ([place]) 04/23/2016   a. 04/2016 Echo: EF 45%, Gr2 DD; b. 11/2019 Echo: EF 45-50%, mod LVH. Gr2 DD. Nl RV fxn. Sev dil LA.    Complication of anesthesia    Essential hypertension    Hip pain    Hyperlipidemia    Hypothyroidism    MI (myocardial infarction) ([place]) 06/05/2015   PONV (postoperative nausea and vomiting)    Vitamin D deficiency    Past Surgical History:  Procedure Laterality Date   ABDOMINAL HYSTERECTOMY     BIOPSY  09/17/2020   Procedure: BIOPSY;  Surgeon: [name], DO;  [facility];  Service: Endoscopy;;   CARDIAC CATHETERIZATION N/A 06/06/2015   Procedure: Left Heart Cath and Coronary Angiography;  Surgeon: Peter M Martinique, MD;  Location: [facility];  Service: Cardiovascular;  Laterality: N/A;   CARDIOVERSION N/A 09/14/2020   Procedure: CARDIOVERSION;  Surgeon: Satira Sark, MD;  [facility]: [facility];  Service: Cardiovascular;  Laterality: N/A;   CHOLECYSTECTOMY     COLONOSCOPY     COLONOSCOPY N/A 08/13/2017   Procedure: COLONOSCOPY;  Surgeon:  [name], Cristopher Estimable, MD;  [facility];  Service: Endoscopy;  Laterality: N/A;  8:30 AM   ESOPHAGOGASTRODUODENOSCOPY N/A 09/11/2016   Procedure: ESOPHAGOGASTRODUODENOSCOPY (EGD);  Surgeon: Daneil Dolin, MD;  [facility];  Service: Endoscopy;  Laterality: N/A;  7:45 am - moved to 10/11 @ 10:30   ESOPHAGOGASTRODUODENOSCOPY (EGD) WITH PROPOFOL N/A 09/17/2020   Procedure: ESOPHAGOGASTRODUODENOSCOPY (EGD) WITH PROPOFOL;  Surgeon: [name], DO;  [facility];  Service: Endoscopy;  Laterality: N/A;    Heel spurs     MALONEY DILATION N/A 09/11/2016   Procedure: Venia Minks DILATION;  Surgeon: Daneil Dolin, MD;  [facility];  Service: Endoscopy;  Laterality: N/A;   POLYPECTOMY  08/13/2017   Procedure: POLYPECTOMY;  Surgeon: Daneil Dolin, MD;  [facility];  Service: Endoscopy;;  colon   SHOULDER ARTHROSCOPY     TUBAL LIGATION      Current Outpatient Medications  Medication Sig Dispense Refill   acetaminophen (TYLENOL) 500 MG tablet Take 500 mg by mouth every 6 (six) hours as needed for mild pain or moderate pain. Reported on 04/22/2016     apixaban (ELIQUIS) 5 MG TABS tablet Take 1 tablet (5 mg total) by mouth 2 (two) times daily. 60 tablet 11   atorvastatin (LIPITOR) 80 MG tablet Take 1 tablet (80 mg total) by mouth at bedtime. 90 tablet 3   calcium carbonate (CALCIUM 600) 600 MG TABS tablet Take 1 tablet (600 mg total) by mouth 2 (two) times daily with a meal. 60 tablet 6   Cholecalciferol (VITAMIN D) 50 MCG (2000 UT) CAPS Take 1 tablet daily 30 capsule 2   ferrous sulfate 325 (65 FE) MG EC tablet Take 325 mg by mouth daily.     furosemide (LASIX) 40 MG tablet Take 40 mg twice a day 4 days/week (Tuesday, Thursday, Saturday, Sunday) and take 40 mg  daily 3 days/week (Monday, Wednesday and Friday) 120 tablet 6   gabapentin (NEURONTIN) 300 MG capsule Take 300 mg by mouth 2 (two) times daily.      KLOR-CON M15 15 MEQ tablet Take 1 tablet by mouth twice daily 180 tablet 3   levothyroxine (SYNTHROID) 100 MCG tablet TAKE 1 TABLET BY MOUTH ONCE DAILY BEFORE BREAKFAST 90 tablet 2   loratadine (CLARITIN) 10 MG tablet Take 10 mg by mouth daily.      metoprolol tartrate (LOPRESSOR) 25 MG tablet Take 1 tablet (25 mg total) by mouth 2 (two) times daily. 180 tablet 3   nitroGLYCERIN (NITROSTAT) 0.4 MG SL tablet DISSOLVE ONE TABLET UNDER THE TONGUE EVERY 5 MINUTES AS NEEDED FOR CHEST PAIN.  DO NOT EXCEED A TOTAL OF 3 DOSES IN 15 MINUTES 25 tablet 3   pantoprazole  (PROTONIX) 40 MG tablet Take 1 tablet (40 mg total) by mouth 2 (two) times daily. 60 tablet 1   potassium chloride (KLOR-CON) 10 MEQ tablet Take 1 tablet (10 meq) 4 days/week (Tuesday, Thursday, Saturday, Sunday) and take 2 tablets (20 meq) 3 days/week (Monday, Wednesday, Friday) 40 tablet 0   ranolazine (RANEXA) 500 MG 12 hr tablet Take 1 tablet (500 mg total) by mouth 2 (two) times daily. 60 tablet 2   No current facility-administered medications for this encounter.    Allergies  Allergen Reactions   Lisinopril Anaphylaxis   Ranolazine Anaphylaxis    Dizzy, Reaction occurred with generic med   Vicodin [Hydrocodone-Acetaminophen] Diarrhea and Nausea And Vomiting   Amoxicillin Swelling   Demerol [Meperidine] Nausea  And Vomiting   Lodine [Etodolac] Rash    Social History   Socioeconomic History   Marital status: Single    Spouse name: Not on file   Number of children: Not on file   Years of education: Not on file   Highest education level: Not on file  Occupational History   Not on file  Tobacco Use   Smoking status: Former Smoker    Types: Cigarettes    Start date: 12/02/1977    Quit date: 12/02/2002    Years since quitting: 17.8   Smokeless tobacco: Never Used  Vaping Use   Vaping Use: Never used  Substance and Sexual Activity   Alcohol use: No    Alcohol/week: 0.0 standard drinks   Drug use: No   Sexual activity: Yes  Other Topics Concern   Not on file  Social History Narrative   Not on file   Social Determinants of Health   Financial Resource Strain: Low Risk    Difficulty of Paying Living Expenses: Not very hard  Food Insecurity:    Worried About Charity fundraiser in the Last Year: Not on file   [employer] of Food in the Last Year: Not on file  Transportation Needs:    Lack of Transportation (Medical): Not on file   Lack of Transportation (Non-Medical): Not on file  Physical Activity:    Days of Exercise per Week: Not on file    Minutes of Exercise per Session: Not on file  Stress:    Feeling of Stress : Not on file  Social Connections:    Frequency of Communication with Friends and Family: Not on file   Frequency of Social Gatherings with Friends and Family: Not on file   Attends Religious Services: Not on file   Active Member of Clubs or Organizations: Not on file   Attends Archivist Meetings: Not on file   Marital Status: Not on file  Intimate Partner Violence:    Fear of Current or Ex-Partner: Not on file   Emotionally Abused: Not on file   Physically Abused: Not on file   Sexually Abused: Not on file     ROS- All systems are reviewed and negative except as per the HPI above.  Physical Exam: Vitals:   10/05/20 0930  BP: (!) 142/90  Pulse: 75  Weight: 97.7 kg  Height: 5' (1.524 m)    GEN- The patient is well appearing obese female, alert and oriented x 3 today.   Head- normocephalic, atraumatic Eyes-  Sclera clear, conjunctiva pink Ears- hearing intact Oropharynx- clear Neck- supple  Lungs- Clear to ausculation bilaterally, normal work of breathing Heart- irregular rate and rhythm, no murmurs, rubs or gallops  GI- soft, NT, ND, + BS Extremities- no clubbing, cyanosis. 1+ bilateral edema MS- no significant deformity or atrophy Skin- no rash or lesion Psych- euthymic mood, full affect Neuro- strength and sensation are intact  Wt Readings from Last 3 Encounters:  10/05/20 97.7 kg  10/02/20 98.9 kg  09/17/20 93 kg    EKG today demonstrates afib HR 75, ST-T changes baseline, QRS 102, QTc 451  Echo 08/29/20 demonstrated  1. Left ventricular ejection fraction, by estimation, is 40 to 45%. The  left ventricle has mildly decreased function. The left ventricle  demonstrates global hypokinesis. There is severe left ventricular  hypertrophy. Left ventricular diastolic parameters  are indeterminate.  2. Right ventricular systolic function is normal. The right ventricular   size is normal. There is  moderately elevated pulmonary artery systolic  pressure.  3. Left atrial size was severely dilated.  4. Right atrial size was severely dilated.  5. The mitral valve is normal in structure. Mild to moderate mitral valve  regurgitation. No evidence of mitral stenosis.  6. Tricuspid valve regurgitation is moderate.  7. The aortic valve is tricuspid. Aortic valve regurgitation is mild to  moderate.  8. Moderate pulmonary HTN, PASP is 50 mmHg.  9. The inferior vena cava is dilated in size with <50% respiratory  variability, suggesting right atrial pressure of 15 mmHg.   Epic records are reviewed at length today  CHA2DS2-VASc Score = 5  The patient's score is based upon: CHF History: 1 HTN History: 1 Diabetes History: 0 Stroke History: 0 Vascular Disease History: 1 Age Score: 1 Gender Score: 1  {This patient does not have a recorded CHADS2-VASc score.   Click here to calculate score.   Then [facility] your note.       :[id number]}    ASSESSMENT AND PLAN: 1. Persistent Atrial Fibrillation (ICD10:  I48.19) The patient's CHA2DS2-VASc score is 5, indicating a 7.2% annual risk of stroke.   S/p DCCV on 09/04/20. General education about afib provided and questions answered.  We discussed therapeutic options today. Agree patient unlikely to maintain SR with DCCV alone given her severe biatrial enlargement. Would avoid class 1C and Multaq with h/o CHF. We discussed dofetilide admission and amiodarone.  Patient wants to pursue dofetilide. She will check on the price of the medication.  Patient will continue on Eliquis 5 mg BID (resumed 09/17/20), states no missed doses PharmD to screen medications for QT prolonging drugs. QTc in SR 442 ms, Recent mag normal. Check bmet today.  2. Secondary Hypercoagulable State (ICD10:  D68.69) The patient is at significant risk for stroke/thromboembolism based upon her CHA2DS2-VASc Score of 5.  Continue Apixaban (Eliquis).    3. Obesity Body mass index is 42.07 kg/m. Lifestyle modification was discussed at length including regular exercise and weight reduction.  4. Suspected obstructive sleep apnea Sleep consult pending.  5. Chronic combined systolic and diastolic CHF Weight stable, down from last visit. Does not appear overtly fluid overloaded today.  6. CAD No anginal symptoms.  7. HTN Stable, no changes today.   Follow up for dofetilide admission.   [facility] [address] [place], [place] [address] [phone] 10/05/2020 9:47 AM

## 2020-10-05 NOTE — Telephone Encounter (Signed)
Medication list reviewed in anticipation of upcoming Tikosyn initiation. Patient is taking Ranexa which can increase concentrations of Tikosyn. Ok for pt to be on both medications but will require close monitoring and potential dose reduction of Tikosyn.  Patient is anticoagulated on Eliquis 5mg  BID on the appropriate dose. Please ensure that patient has not missed any anticoagulation doses in the 3 weeks prior to Tikosyn initiation.   Patient will need to be counseled to avoid use of Benadryl while on Tikosyn and in the 2-3 days prior to Tikosyn initiation.

## 2020-10-06 ENCOUNTER — Other Ambulatory Visit (HOSPITAL_COMMUNITY): Payer: Self-pay

## 2020-10-06 ENCOUNTER — Other Ambulatory Visit (HOSPITAL_COMMUNITY)
Admission: RE | Admit: 2020-10-06 | Discharge: 2020-10-06 | Disposition: A | Discharge status: Home / Self Care | Payer: PPO | Source: Ambulatory Visit | Attending: Physician Assistant | Admitting: Physician Assistant

## 2020-10-06 ENCOUNTER — Other Ambulatory Visit: Payer: Self-pay

## 2020-10-06 DIAGNOSIS — Z20822 Contact with and (suspected) exposure to covid-19: Secondary | ICD-10-CM | POA: Diagnosis not present

## 2020-10-06 DIAGNOSIS — Z01812 Encounter for preprocedural laboratory examination: Secondary | ICD-10-CM | POA: Diagnosis not present

## 2020-10-06 LAB — SARS CORONAVIRUS 2 (TAT 6-24 HRS): SARS Coronavirus 2: NEGATIVE

## 2020-10-06 MED ORDER — POTASSIUM CHLORIDE CRYS ER 20 MEQ PO TBCR: 20.0000 meq | EXTENDED_RELEASE_TABLET | Freq: Every day | ORAL | 3 refills | Status: DC

## 2020-10-10 ENCOUNTER — Encounter (HOSPITAL_COMMUNITY): Payer: Self-pay | Admitting: Internal Medicine

## 2020-10-10 ENCOUNTER — Other Ambulatory Visit: Payer: Self-pay

## 2020-10-10 ENCOUNTER — Ambulatory Visit (HOSPITAL_COMMUNITY)
Admission: RE | Admit: 2020-10-10 | Discharge: 2020-10-10 | Disposition: A | Discharge status: Home / Self Care | Payer: PPO | Source: Ambulatory Visit | Attending: Physician Assistant | Admitting: Physician Assistant

## 2020-10-10 ENCOUNTER — Inpatient Hospital Stay (HOSPITAL_COMMUNITY)
Admission: RE | Admit: 2020-10-10 | Discharge: 2020-10-15 | DRG: 309 | Disposition: A | Discharge status: Home / Self Care | Payer: PPO | Attending: Internal Medicine | Admitting: Internal Medicine

## 2020-10-10 VITALS — BP 156/102 | HR 87 | Ht 60.0 in | Wt 219.2 lb

## 2020-10-10 DIAGNOSIS — K219 Gastro-esophageal reflux disease without esophagitis: Secondary | ICD-10-CM | POA: Diagnosis present

## 2020-10-10 DIAGNOSIS — D6869 Other thrombophilia: Secondary | ICD-10-CM | POA: Diagnosis not present

## 2020-10-10 DIAGNOSIS — I252 Old myocardial infarction: Secondary | ICD-10-CM | POA: Diagnosis not present

## 2020-10-10 DIAGNOSIS — Z87891 Personal history of nicotine dependence: Secondary | ICD-10-CM | POA: Diagnosis not present

## 2020-10-10 DIAGNOSIS — I25118 Atherosclerotic heart disease of native coronary artery with other forms of angina pectoris: Secondary | ICD-10-CM | POA: Diagnosis not present

## 2020-10-10 DIAGNOSIS — I4819 Other persistent atrial fibrillation: Secondary | ICD-10-CM | POA: Diagnosis present

## 2020-10-10 DIAGNOSIS — Z79890 Hormone replacement therapy (postmenopausal): Secondary | ICD-10-CM

## 2020-10-10 DIAGNOSIS — E876 Hypokalemia: Secondary | ICD-10-CM | POA: Diagnosis present

## 2020-10-10 DIAGNOSIS — Z6841 Body Mass Index (BMI) 40.0 and over, adult: Secondary | ICD-10-CM

## 2020-10-10 DIAGNOSIS — I2510 Atherosclerotic heart disease of native coronary artery without angina pectoris: Secondary | ICD-10-CM | POA: Diagnosis not present

## 2020-10-10 DIAGNOSIS — E669 Obesity, unspecified: Secondary | ICD-10-CM | POA: Diagnosis present

## 2020-10-10 DIAGNOSIS — K5900 Constipation, unspecified: Secondary | ICD-10-CM

## 2020-10-10 DIAGNOSIS — K2970 Gastritis, unspecified, without bleeding: Secondary | ICD-10-CM | POA: Diagnosis not present

## 2020-10-10 DIAGNOSIS — Z87892 Personal history of anaphylaxis: Secondary | ICD-10-CM | POA: Diagnosis not present

## 2020-10-10 DIAGNOSIS — Z7901 Long term (current) use of anticoagulants: Secondary | ICD-10-CM

## 2020-10-10 DIAGNOSIS — I110 Hypertensive heart disease with heart failure: Secondary | ICD-10-CM | POA: Diagnosis not present

## 2020-10-10 DIAGNOSIS — I208 Other forms of angina pectoris: Secondary | ICD-10-CM | POA: Diagnosis present

## 2020-10-10 DIAGNOSIS — I5042 Chronic combined systolic (congestive) and diastolic (congestive) heart failure: Secondary | ICD-10-CM | POA: Diagnosis present

## 2020-10-10 DIAGNOSIS — Z8719 Personal history of other diseases of the digestive system: Secondary | ICD-10-CM

## 2020-10-10 DIAGNOSIS — Z885 Allergy status to narcotic agent status: Secondary | ICD-10-CM

## 2020-10-10 DIAGNOSIS — I10 Essential (primary) hypertension: Secondary | ICD-10-CM | POA: Diagnosis present

## 2020-10-10 DIAGNOSIS — Z888 Allergy status to other drugs, medicaments and biological substances status: Secondary | ICD-10-CM

## 2020-10-10 DIAGNOSIS — Z880 Allergy status to penicillin: Secondary | ICD-10-CM

## 2020-10-10 DIAGNOSIS — Z79899 Other long term (current) drug therapy: Secondary | ICD-10-CM

## 2020-10-10 DIAGNOSIS — I2089 Other forms of angina pectoris: Secondary | ICD-10-CM | POA: Diagnosis present

## 2020-10-10 DIAGNOSIS — E559 Vitamin D deficiency, unspecified: Secondary | ICD-10-CM | POA: Diagnosis not present

## 2020-10-10 DIAGNOSIS — E039 Hypothyroidism, unspecified: Secondary | ICD-10-CM | POA: Diagnosis present

## 2020-10-10 DIAGNOSIS — E785 Hyperlipidemia, unspecified: Secondary | ICD-10-CM | POA: Diagnosis present

## 2020-10-10 DIAGNOSIS — Z8249 Family history of ischemic heart disease and other diseases of the circulatory system: Secondary | ICD-10-CM

## 2020-10-10 DIAGNOSIS — E782 Mixed hyperlipidemia: Secondary | ICD-10-CM | POA: Diagnosis present

## 2020-10-10 DIAGNOSIS — E66812 Obesity, class 2: Secondary | ICD-10-CM | POA: Diagnosis present

## 2020-10-10 HISTORY — DX: Unspecified atrial fibrillation: I48.91

## 2020-10-10 LAB — BASIC METABOLIC PANEL
Anion gap: 9 (ref 5–15)
BUN: 18 mg/dL (ref 8–23)
CO2: 26 mmol/L (ref 22–32)
Calcium: 8.4 mg/dL — ABNORMAL LOW (ref 8.9–10.3)
Chloride: 99 mmol/L (ref 98–111)
Creatinine, Ser: 1.13 mg/dL — ABNORMAL HIGH (ref 0.44–1.00)
GFR, Estimated: 53 mL/min — ABNORMAL LOW (ref >60)
Glucose, Bld: 110 mg/dL — ABNORMAL HIGH (ref 70–99)
Potassium: 4.3 mmol/L (ref 3.5–5.1)
Sodium: 134 mmol/L — ABNORMAL LOW (ref 135–145)

## 2020-10-10 LAB — MAGNESIUM: Magnesium: 2.4 mg/dL (ref 1.7–2.4)

## 2020-10-10 MED ORDER — SODIUM CHLORIDE 0.9% FLUSH: 3.0000 mL | INTRAVENOUS | Status: DC | PRN

## 2020-10-10 MED ORDER — LORATADINE 10 MG PO TABS: 10.0000 mg | ORAL_TABLET | Freq: Every day | ORAL | Status: DC

## 2020-10-10 MED ORDER — GABAPENTIN 300 MG PO CAPS
300.0000 mg | ORAL_CAPSULE | Freq: Two times a day (BID) | ORAL | Status: DC
  Administered 2020-10-11 – 2020-10-15 (×10): 300 mg via ORAL
  Filled 2020-10-10 (×10): qty 1

## 2020-10-10 MED ORDER — DOFETILIDE 500 MCG PO CAPS
500.0000 ug | ORAL_CAPSULE | Freq: Two times a day (BID) | ORAL | Status: DC
  Administered 2020-10-10: 500 ug via ORAL
  Filled 2020-10-10: qty 1

## 2020-10-10 MED ORDER — SODIUM CHLORIDE 0.9 % IV SOLN: 250.0000 mL | INTRAVENOUS | Status: DC | PRN

## 2020-10-10 MED ORDER — METOPROLOL TARTRATE 25 MG PO TABS
25.0000 mg | ORAL_TABLET | Freq: Two times a day (BID) | ORAL | Status: DC
  Administered 2020-10-11 – 2020-10-15 (×10): 25 mg via ORAL
  Filled 2020-10-10 (×10): qty 1

## 2020-10-10 MED ORDER — ZOLPIDEM TARTRATE 5 MG PO TABS: 5.0000 mg | ORAL_TABLET | Freq: Every evening | ORAL | Status: DC | PRN

## 2020-10-10 MED ORDER — FERROUS SULFATE 325 (65 FE) MG PO TABS: 325.0000 mg | ORAL_TABLET | Freq: Every day | ORAL | Status: DC

## 2020-10-10 MED ORDER — ONDANSETRON HCL 4 MG/2ML IJ SOLN
4.0000 mg | Freq: Four times a day (QID) | INTRAMUSCULAR | Status: DC | PRN
  Filled 2020-10-10: qty 2

## 2020-10-10 MED ORDER — ATORVASTATIN CALCIUM 80 MG PO TABS
80.0000 mg | ORAL_TABLET | Freq: Every day | ORAL | Status: DC
  Administered 2020-10-11: 80 mg via ORAL
  Filled 2020-10-10: qty 1

## 2020-10-10 MED ORDER — POTASSIUM CHLORIDE CRYS ER 20 MEQ PO TBCR: 20.0000 meq | EXTENDED_RELEASE_TABLET | Freq: Every day | ORAL | Status: DC

## 2020-10-10 MED ORDER — PANTOPRAZOLE SODIUM 40 MG PO TBEC
40.0000 mg | DELAYED_RELEASE_TABLET | Freq: Two times a day (BID) | ORAL | Status: DC
  Administered 2020-10-11 – 2020-10-15 (×10): 40 mg via ORAL
  Filled 2020-10-10 (×10): qty 1

## 2020-10-10 MED ORDER — CALCIUM CARBONATE 1250 (500 CA) MG PO TABS: 1.0000 | ORAL_TABLET | Freq: Two times a day (BID) | ORAL | Status: DC

## 2020-10-10 MED ORDER — CALCIUM CARBONATE 1250 (500 CA) MG PO TABS
1.0000 | ORAL_TABLET | Freq: Two times a day (BID) | ORAL | Status: DC
  Administered 2020-10-10 – 2020-10-15 (×10): 500 mg via ORAL
  Filled 2020-10-10 (×10): qty 1

## 2020-10-10 MED ORDER — RANOLAZINE ER 500 MG PO TB12
500.0000 mg | ORAL_TABLET | Freq: Two times a day (BID) | ORAL | Status: DC
  Administered 2020-10-11: 500 mg via ORAL
  Filled 2020-10-10: qty 1

## 2020-10-10 MED ORDER — METOPROLOL TARTRATE 25 MG PO TABS: 25.0000 mg | ORAL_TABLET | Freq: Two times a day (BID) | ORAL | Status: DC

## 2020-10-10 MED ORDER — APIXABAN 5 MG PO TABS
5.0000 mg | ORAL_TABLET | Freq: Two times a day (BID) | ORAL | Status: DC
  Administered 2020-10-10 – 2020-10-15 (×10): 5 mg via ORAL
  Filled 2020-10-10 (×10): qty 1

## 2020-10-10 MED ORDER — SODIUM CHLORIDE 0.9% FLUSH
3.0000 mL | Freq: Two times a day (BID) | INTRAVENOUS | Status: DC
  Administered 2020-10-10 – 2020-10-15 (×8): 3 mL via INTRAVENOUS

## 2020-10-10 MED ORDER — GABAPENTIN 300 MG PO CAPS: 300.0000 mg | ORAL_CAPSULE | Freq: Two times a day (BID) | ORAL | Status: DC

## 2020-10-10 MED ORDER — FUROSEMIDE 40 MG PO TABS
40.0000 mg | ORAL_TABLET | Freq: Two times a day (BID) | ORAL | Status: DC
  Administered 2020-10-10: 40 mg via ORAL
  Filled 2020-10-10: qty 1

## 2020-10-10 MED ORDER — PANTOPRAZOLE SODIUM 40 MG PO TBEC: 40.0000 mg | DELAYED_RELEASE_TABLET | Freq: Two times a day (BID) | ORAL | Status: DC

## 2020-10-10 MED ORDER — ACETAMINOPHEN 325 MG PO TABS: 650.0000 mg | ORAL_TABLET | ORAL | Status: DC | PRN

## 2020-10-10 MED ORDER — LEVOTHYROXINE SODIUM 100 MCG PO TABS: 100.0000 ug | ORAL_TABLET | Freq: Every day | ORAL | Status: DC

## 2020-10-10 MED ORDER — RANOLAZINE ER 500 MG PO TB12: 500.0000 mg | ORAL_TABLET | Freq: Two times a day (BID) | ORAL | Status: DC

## 2020-10-10 MED ORDER — NITROGLYCERIN 0.4 MG SL SUBL: 0.4000 mg | SUBLINGUAL_TABLET | SUBLINGUAL | Status: DC | PRN

## 2020-10-10 MED ORDER — PROMETHAZINE HCL 25 MG/ML IJ SOLN
12.5000 mg | Freq: Four times a day (QID) | INTRAMUSCULAR | Status: DC | PRN
  Filled 2020-10-10: qty 1

## 2020-10-10 MED ORDER — ALPRAZOLAM 0.25 MG PO TABS: 0.2500 mg | ORAL_TABLET | Freq: Two times a day (BID) | ORAL | Status: DC | PRN

## 2020-10-10 MED ORDER — FUROSEMIDE 40 MG PO TABS: 40.0000 mg | ORAL_TABLET | Freq: Two times a day (BID) | ORAL | Status: DC

## 2020-10-10 NOTE — Progress Notes (Addendum)
Pharmacy: Dofetilide (Tikosyn) - Initial Consult Assessment and Electrolyte Replacement  Pharmacy consulted to assist in monitoring and replacing electrolytes in this 70 y.o. female admitted on 10/10/2020 undergoing dofetilide initiation. First dofetilide dose: 11/9@2000   Assessment:  Patient Exclusion Criteria: If any screening criteria checked as "Yes", then  patient  should NOT receive dofetilide until criteria item is corrected.  If "Yes" please indicate correction plan.  YES  NO Patient  Exclusion Criteria Correction Plan   [x]   []   Baseline QTc interval is greater than or equal to 440 msec. IF above YES box checked dofetilide contraindicated unless patient has ICD; then may proceed if QTc 500-550 msec or with known ventricular conduction abnormalities may proceed with QTc 550-600 msec. QTc = 476  EP aware - okay to continue    []   [x]   Patient is known or suspected to have a digoxin level greater than 2 ng/ml: No results found for: DIGOXIN     []   [x]   Creatinine clearance less than 20 ml/min (calculated using Cockcroft-Gault, actual body weight and serum creatinine): Estimated Creatinine Clearance: 51.1 mL/min (A) (by C-G formula based on SCr of 1.13 mg/dL (H)).     []   [x]  Patient has received drugs known to prolong the QT intervals within the last 48 hours (phenothiazines, tricyclics or tetracyclic antidepressants, erythromycin, H-1 antihistamines, cisapride, fluoroquinolones, azithromycin). Updated information on QT prolonging agents is available to be searched on the following database:QT prolonging agents  Ranolazine has conditional risk of QTc prolongation - will monitor closely with tikosyn initiation. Took zofran yesterday - none today, ok to continue with initiation.    []   [x]   Patient received a dose of hydrochlorothiazide (Oretic) alone or in any combination including triamterene (Dyazide, Maxzide) in the last 48 hours.    []   [x]  Patient received a medication  known to increase dofetilide plasma concentrations prior to initial dofetilide dose:  . Trimethoprim (Primsol, Proloprim) in the last 36 hours . Verapamil (Calan, Verelan) in the last 36 hours or a sustained release dose in the last 72 hours . Megestrol (Megace) in the last 5 days  . Cimetidine (Tagamet) in the last 6 hours . Ketoconazole (Nizoral) in the last 24 hours . Itraconazole (Sporanox) in the last 48 hours  . Prochlorperazine (Compazine) in the last 36 hours     []   [x]   Patient is known to have a history of torsades de pointes; congenital or acquired long QT syndromes.    []   [x]   Patient has received a Class 1 antiarrhythmic with less than 2 half-lives since last dose. (Disopyramide, Quinidine, Procainamide, Lidocaine, Mexiletine, Flecainide, Propafenone)    []   [x]   Patient has received amiodarone therapy in the past 3 months or amiodarone level is greater than 0.3 ng/ml.    Patient has been appropriately anticoagulated with apixaban.  Labs:    Component Value Date/Time   K 4.3 10/10/2020 1026   MG 2.4 10/10/2020 1026     Plan: Potassium: K >/= 4: Appropriate to initiate Tikosyn, no replacement needed    Magnesium: Mg >2: Appropriate to initiate Tikosyn, no replacement needed     Thank you for allowing pharmacy to participate in this patient's care   Antonietta Jewel, PharmD, [facility] Pharmacist  Phone: [phone] 10/10/2020 1:38 PM  Please check AMION for all [facility] phone numbers After 10:00 PM, call [facility] [phone]

## 2020-10-10 NOTE — Progress Notes (Signed)
Primary Care Physician: Alycia Rossetti, MD Primary Cardiologist: Dr Bronson Ing (former) Primary Electrophysiologist: Dr Rayann Heman (new) Referring Physician: [name] is a 69 y.o. female with a history of CAD, chronic combined systolic and diastolic CHF, HLD, HTN, hypothyroidism, and persistent atrial fibrillation who presents for follow up in the [facility]. The patient was initially diagnosed with atrial fibrillation 08/18/20 at a routine follow up with Maria Fry. She was having symptoms of increased fatigue. She was started on Eliquis for a CHADS2VASC score of 5. She underwent DCCV on 09/14/20. She was hospitalized 10/15-10/17/21 for acute CHF and black stools. Workup revealed gastritis and her Eliquis was temporarily held. She resumed 09/17/20. Her ASA was discontinued and she has not had recurrence of bleeding. She was back in afib on follow up 10/02/20. She reports that she did feel better in SR with more energy and less SOB.  On follow up today, patient presents for dofetilide admission. She continues to have symptoms of SOB and fatigue. She also reports nausea for the past week. She denies vomiting or diarrhea. She did take several doses of Zofran, last dose was yesterday. She denies any missed doses of anticoagulation and also denies bleeding issues.   Today, she denies symptoms of palpitations, chest pain, orthopnea, PND, dizziness, presyncope, syncope, bleeding, or neurologic sequela. The patient is tolerating medications without difficulties and is otherwise without complaint today.    Atrial Fibrillation Risk Factors:  she does have symptoms or diagnosis of sleep apnea. Sleep consult with Dr Halford Chessman pending. she does not have a history of rheumatic fever.   she has a BMI of Body mass index is 42.81 kg/m.Marland Kitchen Filed Weights   10/10/20 1030  Weight: 99.4 kg    Family History  Problem Relation Age of Onset  . Heart failure Father         Deceased  . Heart attack Father        Deceased  . Stroke Sister        Deceased  . Heart failure Brother        Deceased  . Cancer Brother        Deceased  . Heart attack Brother        Deceased  . Colon cancer Neg Hx      Atrial Fibrillation Management history:  Previous antiarrhythmic drugs: none Previous cardioversions: 09/14/20 Previous ablations: none CHADS2VASC score: 5 Anticoagulation history: Eliquis   Past Medical History:  Diagnosis Date  . CAD (coronary artery disease)    a. 06/07/15 NSTEMI/Cath: Dist LAD 100%, mid LAD 10%. Otw nl cors. Nl EF w/ inferoapical AK.  Marland Kitchen Chronic combined systolic (congestive) and diastolic (congestive) heart failure ([place]) 04/23/2016   a. 04/2016 Echo: EF 45%, Gr2 DD; b. 11/2019 Echo: EF 45-50%, mod LVH. Gr2 DD. Nl RV fxn. Sev dil LA.   Marland Kitchen Complication of anesthesia   . Essential hypertension   . Hip pain   . Hyperlipidemia   . Hypothyroidism   . MI (myocardial infarction) ([place]) 06/05/2015  . PONV (postoperative nausea and vomiting)   . Vitamin D deficiency    Past Surgical History:  Procedure Laterality Date  . ABDOMINAL HYSTERECTOMY    . BIOPSY  09/17/2020   Procedure: BIOPSY;  Surgeon: [name], DO;  [facility];  Service: Endoscopy;;  . CARDIAC CATHETERIZATION N/A 06/06/2015   Procedure: Left Heart Cath and Coronary Angiography;  Surgeon: Peter M Martinique, MD;  Location: [facility];  Service: Cardiovascular;  Laterality: N/A;  . CARDIOVERSION N/A 09/14/2020   Procedure: CARDIOVERSION;  Surgeon: Satira Sark, MD;  Location: [facility];  Service: Cardiovascular;  Laterality: N/A;  . CHOLECYSTECTOMY    . COLONOSCOPY    . COLONOSCOPY N/A 08/13/2017   Procedure: COLONOSCOPY;  Surgeon: Daneil Dolin, MD;  [facility]: [facility];  Service: Endoscopy;  Laterality: N/A;  8:30 AM  . ESOPHAGOGASTRODUODENOSCOPY N/A 09/11/2016   Procedure: ESOPHAGOGASTRODUODENOSCOPY (EGD);  Surgeon: Daneil Dolin, MD;  [facility]: [facility];  Service: Endoscopy;  Laterality: N/A;  7:45 am - moved to 10/11 @ 10:30  . ESOPHAGOGASTRODUODENOSCOPY (EGD) WITH PROPOFOL N/A 09/17/2020   Procedure: ESOPHAGOGASTRODUODENOSCOPY (EGD) WITH PROPOFOL;  Surgeon: [name], DO;  [facility];  Service: Endoscopy;  Laterality: N/A;  . Heel spurs    . MALONEY DILATION N/A 09/11/2016   Procedure: Venia Minks DILATION;  Surgeon: Daneil Dolin, MD;  [facility];  Service: Endoscopy;  Laterality: N/A;  . POLYPECTOMY  08/13/2017   Procedure: POLYPECTOMY;  Surgeon: Daneil Dolin, MD;  Location: [facility];  Service: Endoscopy;;  colon  . SHOULDER ARTHROSCOPY    . TUBAL LIGATION      Current Outpatient Medications  Medication Sig Dispense Refill  . acetaminophen (TYLENOL) 500 MG tablet Take 500 mg by mouth every 6 (six) hours as needed for mild pain or moderate pain. Reported on 04/22/2016    . apixaban (ELIQUIS) 5 MG TABS tablet Take 1 tablet (5 mg total) by mouth 2 (two) times daily. 60 tablet 11  . atorvastatin (LIPITOR) 80 MG tablet Take 1 tablet (80 mg total) by mouth at bedtime. 90 tablet 3  . calcium carbonate (CALCIUM 600) 600 MG TABS tablet Take 1 tablet (600 mg total) by mouth 2 (two) times daily with a meal. 60 tablet 6  . Cholecalciferol (VITAMIN D) 50 MCG (2000 UT) CAPS Take 1 tablet daily 30 capsule 2  . ferrous sulfate 325 (65 FE) MG EC tablet Take 325 mg by mouth daily.    . furosemide (LASIX) 40 MG tablet Take 40 mg twice a day 4 days/week (Tuesday, Thursday, Saturday, Sunday) and take 40 mg  daily 3 days/week (Monday, Wednesday and Friday) 120 tablet 6  . gabapentin (NEURONTIN) 300 MG capsule Take 300 mg by mouth 2 (two) times daily.     Marland Kitchen levothyroxine (SYNTHROID) 100 MCG tablet TAKE 1 TABLET BY MOUTH ONCE DAILY BEFORE BREAKFAST 90 tablet 2  . loratadine (CLARITIN) 10 MG tablet Take 10 mg by mouth daily.     . metoprolol tartrate (LOPRESSOR) 25 MG tablet Take 1 tablet  (25 mg total) by mouth 2 (two) times daily. 180 tablet 3  . nitroGLYCERIN (NITROSTAT) 0.4 MG SL tablet DISSOLVE ONE TABLET UNDER THE TONGUE EVERY 5 MINUTES AS NEEDED FOR CHEST PAIN.  DO NOT EXCEED A TOTAL OF 3 DOSES IN 15 MINUTES 25 tablet 3  . pantoprazole (PROTONIX) 40 MG tablet Take 1 tablet (40 mg total) by mouth 2 (two) times daily. 60 tablet 1  . potassium chloride (KLOR-CON) 20 MEQ tablet Take 1 tablet (20 mEq total) by mouth daily. 30 tablet 3  . ranolazine (RANEXA) 500 MG 12 hr tablet Take 1 tablet (500 mg total) by mouth 2 (two) times daily. 60 tablet 2   No current facility-administered medications for this encounter.    Allergies  Allergen Reactions  . Lisinopril Anaphylaxis  . Ranolazine  Anaphylaxis    Dizzy, Reaction occurred with generic med  . Vicodin [Hydrocodone-Acetaminophen] Diarrhea and Nausea And Vomiting  . Amoxicillin Swelling  . Demerol [Meperidine] Nausea And Vomiting  . Lodine [Etodolac] Rash    Social History   Socioeconomic History  . Marital status: Single    Spouse name: Not on file  . Number of children: Not on file  . Years of education: Not on file  . Highest education level: Not on file  Occupational History  . Not on file  Tobacco Use  . Smoking status: Former Smoker    Types: Cigarettes    Start date: 12/02/1977    Quit date: 12/02/2002    Years since quitting: 17.8  . Smokeless tobacco: Never Used  Vaping Use  . Vaping Use: Never used  Substance and Sexual Activity  . Alcohol use: No    Alcohol/week: 0.0 standard drinks  . Drug use: No  . Sexual activity: Yes  Other Topics Concern  . Not on file  Social History Narrative  . Not on file   Social Determinants of Health   Financial Resource Strain: Low Risk   . Difficulty of Paying Living Expenses: Not very hard  Food Insecurity:   . Worried About Charity fundraiser in the Last Year: Not on file  . Ran Out of Food in the Last Year: Not on file  Transportation Needs:   . Lack of  Transportation (Medical): Not on file  . Lack of Transportation (Non-Medical): Not on file  Physical Activity:   . Days of Exercise per Week: Not on file  . Minutes of Exercise per Session: Not on file  Stress:   . Feeling of Stress : Not on file  Social Connections:   . Frequency of Communication with Friends and Family: Not on file  . Frequency of Social Gatherings with Friends and Family: Not on file  . Attends Religious Services: Not on file  . Active Member of Clubs or Organizations: Not on file  . Attends Archivist Meetings: Not on file  . Marital Status: Not on file  Intimate Partner Violence:   . Fear of Current or Ex-Partner: Not on file  . Emotionally Abused: Not on file  . Physically Abused: Not on file  . Sexually Abused: Not on file     ROS- All systems are reviewed and negative except as per the HPI above.  Physical Exam: Vitals:   10/10/20 1030  BP: (!) 156/102  Pulse: 87  Weight: 99.4 kg  Height: 5' (1.524 m)    GEN- The patient is well appearing obese female, alert and oriented x 3 today.   HEENT-head normocephalic, atraumatic, sclera clear, conjunctiva pink, hearing intact, trachea midline. Lungs- Clear to ausculation bilaterally, normal work of breathing Heart- irregular rate and rhythm, no murmurs, rubs or gallops  GI- soft, NT, ND, + BS Extremities- no clubbing, cyanosis, or edema MS- no significant deformity or atrophy Skin- no rash or lesion Psych- euthymic mood, full affect Neuro- strength and sensation are intact   Wt Readings from Last 3 Encounters:  10/10/20 99.4 kg  10/05/20 97.7 kg  10/02/20 98.9 kg    EKG today demonstrates afib HR 87, NST, QRS 108, QTc 476  Echo 08/29/20 demonstrated  1. Left ventricular ejection fraction, by estimation, is 40 to 45%. The  left ventricle has mildly decreased function. The left ventricle  demonstrates global hypokinesis. There is severe left ventricular  hypertrophy. Left ventricular  diastolic parameters  are indeterminate.  2. Right ventricular systolic function is normal. The right ventricular  size is normal. There is moderately elevated pulmonary artery systolic  pressure.  3. Left atrial size was severely dilated.  4. Right atrial size was severely dilated.  5. The mitral valve is normal in structure. Mild to moderate mitral valve  regurgitation. No evidence of mitral stenosis.  6. Tricuspid valve regurgitation is moderate.  7. The aortic valve is tricuspid. Aortic valve regurgitation is mild to  moderate.  8. Moderate pulmonary HTN, PASP is 50 mmHg.  9. The inferior vena cava is dilated in size with <50% respiratory  variability, suggesting right atrial pressure of 15 mmHg.   Epic records are reviewed at length today  CHA2DS2-VASc Score = 5  The patient's score is based upon: CHF History: 1 HTN History: 1 Diabetes History: 0 Stroke History: 0 Vascular Disease History: 1 Age Score: 1 Gender Score: 1      ASSESSMENT AND PLAN: 1. Persistent Atrial Fibrillation (ICD10:  I48.19) The patient's CHA2DS2-VASc score is 5, indicating a 7.2% annual risk of stroke.   Patient wants to pursue dofetilide, aware of risk vrs benefit Aware of price of dofetilide. Patient will continue on Eliquis 5 mg BID, states no missed doses in the last 3 weeks. No benadryl use PharmD has screened medications, will need to monitor QT on Ranexa. Discussed Zofran use with pharmacy. OK for dofetilide if no Zofran today.  QTc in SR 442 ms, Labs today show creatinine at 1.13, K+ 4.3 and mag 2.4, CrCl calculated at 76 mL/min  2. Secondary Hypercoagulable State (ICD10:  D68.69) The patient is at significant risk for stroke/thromboembolism based upon her CHA2DS2-VASc Score of 5.  Continue Apixaban (Eliquis).   3. Obesity Body mass index is 42.81 kg/m. Lifestyle modification was discussed and encouraged including regular physical activity and weight reduction.  4.  Suspected obstructive sleep apnea Sleep consult pending.  5. Chronic combined systolic and diastolic CHF Her weight is up ~4 lbs since her last visit. Hopefully this will improve with SR.  6. CAD No anginal symptoms.  7. HTN Elevated today, will reassess in SR.   To be admitted later today once a bed becomes available.   [facility] [address] [place], [place] [address] [phone] 10/10/2020 11:00 AM

## 2020-10-10 NOTE — Care Management (Signed)
0505 10-10-20 Patient presented for Tikosyn Load. Benefits check submitted and Case Manager will follow for cost and discuss pharmacy of choice with patient. Bethena Roys, RN,BSN Case Manager

## 2020-10-10 NOTE — H&P (Signed)
Primary Care Physician: Alycia Rossetti, MD Primary Cardiologist: Dr Bronson Ing (former) Primary Electrophysiologist: Dr Rayann Heman (new) Referring Physician: Otelia Sergeant is a 69 y.o. female with a history of CAD, chronic combined systolic and diastolic CHF, HLD, HTN, hypothyroidism, and persistent atrial fibrillation who presents for follow up in the [facility]. The patient was initially diagnosed with atrial fibrillation 08/18/20 at a routine follow up with Rosaria Ferries. She was having symptoms of increased fatigue. She was started on Eliquis for a CHADS2VASC score of 5. She underwent DCCV on 09/14/20. She was hospitalized 10/15-10/17/21 for acute CHF and black stools. Workup revealed gastritis and her Eliquis was temporarily held. She resumed 09/17/20. Her ASA was discontinued and she has not had recurrence of bleeding. She was back in afib on follow up 10/02/20. She reports that she did feel better in SR with more energy and less SOB.  On follow up today, patient presents for dofetilide admission. She continues to have symptoms of SOB and fatigue. She also reports nausea for the past week. She denies vomiting or diarrhea. She did take several doses of Zofran, last dose was yesterday. She denies any missed doses of anticoagulation and also denies bleeding issues.   Today, she denies symptoms of palpitations, chest pain, orthopnea, PND, dizziness, presyncope, syncope, bleeding, or neurologic sequela. The patient is tolerating medications without difficulties and is otherwise without complaint today.    Atrial Fibrillation Risk Factors:  she does have symptoms or diagnosis of sleep apnea. Sleep consult with Dr Halford Chessman pending. she does not have a history of rheumatic fever.   she has a BMI of Body mass index is 42.69 kg/m.Marland Kitchen Filed Weights   10/10/20 1300  Weight: 99.2 kg    Family History  Problem Relation Age of Onset  . Heart failure Father         Deceased  . Heart attack Father        Deceased  . Stroke Sister        Deceased  . Heart failure Brother        Deceased  . Cancer Brother        Deceased  . Heart attack Brother        Deceased  . Colon cancer Neg Hx      Atrial Fibrillation Management history:  Previous antiarrhythmic drugs: none Previous cardioversions: 09/14/20 Previous ablations: none CHADS2VASC score: 5 Anticoagulation history: Eliquis   Past Medical History:  Diagnosis Date  . Atrial fibrillation ([place])   . CAD (coronary artery disease)    a. 06/07/15 NSTEMI/Cath: Dist LAD 100%, mid LAD 10%. Otw nl cors. Nl EF w/ inferoapical AK.  Marland Kitchen Chronic combined systolic (congestive) and diastolic (congestive) heart failure ([place]) 04/23/2016   a. 04/2016 Echo: EF 45%, Gr2 DD; b. 11/2019 Echo: EF 45-50%, mod LVH. Gr2 DD. Nl RV fxn. Sev dil LA.   Marland Kitchen Complication of anesthesia   . Essential hypertension   . Hip pain   . Hyperlipidemia   . Hypothyroidism   . MI (myocardial infarction) ([place]) 06/05/2015  . PONV (postoperative nausea and vomiting)   . Vitamin D deficiency    Past Surgical History:  Procedure Laterality Date  . ABDOMINAL HYSTERECTOMY    . BIOPSY  09/17/2020   Procedure: BIOPSY;  Surgeon: [name], DO;  [facility];  Service: Endoscopy;;  . CARDIAC CATHETERIZATION N/A 06/06/2015   Procedure: Left Heart Cath and Coronary Angiography;  Surgeon: Peter M Martinique, MD;  Location: [place] [facility];  Service: Cardiovascular;  Laterality: N/A;  . CARDIOVERSION N/A 09/14/2020   Procedure: CARDIOVERSION;  Surgeon: Satira Sark, MD;  [facility];  Service: Cardiovascular;  Laterality: N/A;  . CHOLECYSTECTOMY    . COLONOSCOPY    . COLONOSCOPY N/A 08/13/2017   Procedure: COLONOSCOPY;  Surgeon: Daneil Dolin, MD;  [facility];  Service: Endoscopy;  Laterality: N/A;  8:30 AM  . ESOPHAGOGASTRODUODENOSCOPY N/A 09/11/2016   Procedure: ESOPHAGOGASTRODUODENOSCOPY  (EGD);  Surgeon: Daneil Dolin, MD;  [facility];  Service: Endoscopy;  Laterality: N/A;  7:45 am - moved to 10/11 @ 10:30  . ESOPHAGOGASTRODUODENOSCOPY (EGD) WITH PROPOFOL N/A 09/17/2020   Procedure: ESOPHAGOGASTRODUODENOSCOPY (EGD) WITH PROPOFOL;  Surgeon: [name], DO;  [facility];  Service: Endoscopy;  Laterality: N/A;  . Heel spurs    . MALONEY DILATION N/A 09/11/2016   Procedure: Venia Minks DILATION;  Surgeon: Daneil Dolin, MD;  [facility];  Service: Endoscopy;  Laterality: N/A;  . POLYPECTOMY  08/13/2017   Procedure: POLYPECTOMY;  Surgeon: Daneil Dolin, MD;  [facility];  Service: Endoscopy;;  colon  . SHOULDER ARTHROSCOPY    . TUBAL LIGATION      Current Facility-Administered Medications  Medication Dose Route Frequency Provider Last Rate Last Admin  . 0.9 %  sodium chloride infusion  250 mL Intravenous PRN Fenton, Clint R, PA      . apixaban (ELIQUIS) tablet 5 mg  5 mg Oral BID Fenton, Clint R, PA      . atorvastatin (LIPITOR) tablet 80 mg  80 mg Oral QHS Fenton, Clint R, PA      . calcium carbonate (OS-CAL - dosed in mg of elemental calcium) tablet 500 mg of elemental calcium  1 tablet Oral BID WC Fenton, Clint R, PA      . dofetilide (TIKOSYN) capsule 500 mcg  500 mcg Oral BID Fenton, Clint R, PA      . [START ON 10/11/2020] ferrous sulfate tablet 325 mg  325 mg Oral Daily Fenton, Clint R, PA      . furosemide (LASIX) tablet 40 mg  40 mg Oral BID Fenton, Clint R, PA      . gabapentin (NEURONTIN) capsule 300 mg  300 mg Oral BID Fenton, Clint R, PA      . [START ON 10/11/2020] levothyroxine (SYNTHROID) tablet 100 mcg  100 mcg Oral Q0600 Fenton, Clint R, PA      . [START ON 10/11/2020] loratadine (CLARITIN) tablet 10 mg  10 mg Oral Daily Fenton, Clint R, PA      . metoprolol tartrate (LOPRESSOR) tablet 25 mg  25 mg Oral BID Fenton, Clint R, PA      . nitroGLYCERIN (NITROSTAT) SL tablet 0.4 mg  0.4 mg Sublingual Q5 min PRN  Fenton, Clint R, PA      . pantoprazole (PROTONIX) EC tablet 40 mg  40 mg Oral BID Fenton, Clint R, PA      . [START ON 10/11/2020] potassium chloride SA (KLOR-CON) CR tablet 20 mEq  20 mEq Oral Daily Fenton, Clint R, PA      . ranolazine (RANEXA) 12 hr tablet 500 mg  500 mg Oral BID Fenton, Clint R, PA      . sodium chloride flush (NS) 0.9 % injection 3 mL  3 mL Intravenous Q12H Fenton, Clint R, PA      .  sodium chloride flush (NS) 0.9 % injection 3 mL  3 mL Intravenous PRN Fenton, Clint R, PA        Allergies  Allergen Reactions  . Lisinopril Anaphylaxis  . Ranolazine Anaphylaxis    Dizzy, Reaction occurred with generic med  . Vicodin [Hydrocodone-Acetaminophen] Diarrhea and Nausea And Vomiting  . Amoxicillin Swelling  . Demerol [Meperidine] Nausea And Vomiting  . Lodine [Etodolac] Rash    Social History   Socioeconomic History  . Marital status: Single    Spouse name: Not on file  . Number of children: Not on file  . Years of education: Not on file  . Highest education level: Not on file  Occupational History  . Not on file  Tobacco Use  . Smoking status: Former Smoker    Types: Cigarettes    Start date: 12/02/1977    Quit date: 12/02/2002    Years since quitting: 17.8  . Smokeless tobacco: Never Used  Vaping Use  . Vaping Use: Never used  Substance and Sexual Activity  . Alcohol use: No    Alcohol/week: 0.0 standard drinks  . Drug use: No  . Sexual activity: Yes  Other Topics Concern  . Not on file  Social History Narrative  . Not on file   Social Determinants of Health   Financial Resource Strain: Low Risk   . Difficulty of Paying Living Expenses: Not very hard  Food Insecurity:   . Worried About Charity fundraiser in the Last Year: Not on file  . Ran Out of Food in the Last Year: Not on file  Transportation Needs:   . Lack of Transportation (Medical): Not on file  . Lack of Transportation (Non-Medical): Not on file  Physical Activity:   . Days of Exercise  per Week: Not on file  . Minutes of Exercise per Session: Not on file  Stress:   . Feeling of Stress : Not on file  Social Connections:   . Frequency of Communication with Friends and Family: Not on file  . Frequency of Social Gatherings with Friends and Family: Not on file  . Attends Religious Services: Not on file  . Active Member of Clubs or Organizations: Not on file  . Attends Archivist Meetings: Not on file  . Marital Status: Not on file  Intimate Partner Violence:   . Fear of Current or Ex-Partner: Not on file  . Emotionally Abused: Not on file  . Physically Abused: Not on file  . Sexually Abused: Not on file     ROS- All systems are reviewed and negative except as per the HPI above.  Physical Exam: Vitals:   10/10/20 1300  BP: (!) 166/97  Pulse: 90  Resp: 16  Temp: 97.8 F (36.6 C)  TempSrc: Oral  SpO2: 100%  Weight: 99.2 kg  Height: 5' (1.524 m)    GEN- The patient is well appearing obese female, alert and oriented x 3 today.   HEENT-head normocephalic, atraumatic, sclera clear, conjunctiva pink, hearing intact, trachea midline. Lungs- Clear to ausculation bilaterally, normal work of breathing Heart- irregular rate and rhythm, no murmurs, rubs or gallops  GI- soft, NT, ND, + BS Extremities- no clubbing, cyanosis, or edema MS- no significant deformity or atrophy Skin- no rash or lesion Psych- euthymic mood, full affect Neuro- strength and sensation are intact   Wt Readings from Last 3 Encounters:  10/10/20 99.2 kg  10/10/20 99.4 kg  10/05/20 97.7 kg    EKG today demonstrates  afib HR 87, NST, QRS 108, QTc 476  Echo 08/29/20 demonstrated  1. Left ventricular ejection fraction, by estimation, is 40 to 45%. The  left ventricle has mildly decreased function. The left ventricle  demonstrates global hypokinesis. There is severe left ventricular  hypertrophy. Left ventricular diastolic parameters  are indeterminate.  2. Right ventricular  systolic function is normal. The right ventricular  size is normal. There is moderately elevated pulmonary artery systolic  pressure.  3. Left atrial size was severely dilated.  4. Right atrial size was severely dilated.  5. The mitral valve is normal in structure. Mild to moderate mitral valve  regurgitation. No evidence of mitral stenosis.  6. Tricuspid valve regurgitation is moderate.  7. The aortic valve is tricuspid. Aortic valve regurgitation is mild to  moderate.  8. Moderate pulmonary HTN, PASP is 50 mmHg.  9. The inferior vena cava is dilated in size with <50% respiratory  variability, suggesting right atrial pressure of 15 mmHg.   Epic records are reviewed at length today  CHA2DS2-VASc Score = 5  The patient's score is based upon: CHF History: 1 HTN History: 1 Diabetes History: 0 Stroke History: 0 Vascular Disease History: 1 Age Score: 1 Gender Score: 1      ASSESSMENT AND PLAN: 1. Persistent Atrial Fibrillation (ICD10:  I48.19) The patient's CHA2DS2-VASc score is 5, indicating a 7.2% annual risk of stroke.   Patient wants to pursue dofetilide, aware of risk vrs benefit Aware of price of dofetilide. Patient will continue on Eliquis 5 mg BID, states no missed doses in the last 3 weeks. No benadryl use PharmD has screened medications, will need to monitor QT on Ranexa. Discussed Zofran use with pharmacy. OK for dofetilide if no Zofran today.  QTc in SR 442 ms, Labs today show creatinine at 1.13, K+ 4.3 and mag 2.4, CrCl calculated at 76 mL/min  2. Secondary Hypercoagulable State (ICD10:  D68.69) The patient is at significant risk for stroke/thromboembolism based upon her CHA2DS2-VASc Score of 5.  Continue Apixaban (Eliquis).   3. Obesity Body mass index is 42.69 kg/m. Lifestyle modification was discussed and encouraged including regular physical activity and weight reduction.  4. Suspected obstructive sleep apnea Sleep consult pending.  5. Chronic  combined systolic and diastolic CHF Her weight is up ~4 lbs since her last visit. Hopefully this will improve with SR.  6. CAD No anginal symptoms.  7. HTN Elevated today, will reassess in SR.   To be admitted later today once a bed becomes available.   [facility] [address] [place], [place] [address] [phone] 10/10/2020 3:00 PM    I have seen, examined the patient, and reviewed the above assessment and plan.  Changes to above are made where necessary.  On exam, iRRR.  We will admit for initiation of tikosyn.  She reports compliance with eliquis without interruption.  Co Sign: Thompson Grayer, MD 10/10/2020 3:00 PM

## 2020-10-11 DIAGNOSIS — I5042 Chronic combined systolic (congestive) and diastolic (congestive) heart failure: Secondary | ICD-10-CM | POA: Diagnosis not present

## 2020-10-11 DIAGNOSIS — I4819 Other persistent atrial fibrillation: Secondary | ICD-10-CM | POA: Diagnosis not present

## 2020-10-11 LAB — BASIC METABOLIC PANEL
Anion gap: 10 (ref 5–15)
Anion gap: 9 (ref 5–15)
BUN: 16 mg/dL (ref 8–23)
BUN: 17 mg/dL (ref 8–23)
CO2: 26 mmol/L (ref 22–32)
CO2: 26 mmol/L (ref 22–32)
Calcium: 8.2 mg/dL — ABNORMAL LOW (ref 8.9–10.3)
Calcium: 8.2 mg/dL — ABNORMAL LOW (ref 8.9–10.3)
Chloride: 99 mmol/L (ref 98–111)
Chloride: 100 mmol/L (ref 98–111)
Creatinine, Ser: 1.16 mg/dL — ABNORMAL HIGH (ref 0.44–1.00)
Creatinine, Ser: 1.27 mg/dL — ABNORMAL HIGH (ref 0.44–1.00)
GFR, Estimated: 52 mL/min — ABNORMAL LOW (ref >60)
GFR, Estimated: 46 mL/min — ABNORMAL LOW (ref >60)
Glucose, Bld: 94 mg/dL (ref 70–99)
Glucose, Bld: 112 mg/dL — ABNORMAL HIGH (ref 70–99)
Potassium: 3.2 mmol/L — ABNORMAL LOW (ref 3.5–5.1)
Potassium: 3.9 mmol/L (ref 3.5–5.1)
Sodium: 135 mmol/L (ref 135–145)
Sodium: 135 mmol/L (ref 135–145)

## 2020-10-11 LAB — MAGNESIUM: Magnesium: 1.9 mg/dL (ref 1.7–2.4)

## 2020-10-11 MED ORDER — POTASSIUM CHLORIDE CRYS ER 20 MEQ PO TBCR
40.0000 meq | EXTENDED_RELEASE_TABLET | ORAL | Status: AC
  Administered 2020-10-11 (×2): 40 meq via ORAL
  Filled 2020-10-11 (×2): qty 2

## 2020-10-11 MED ORDER — RANOLAZINE ER 500 MG PO TB12: 500.0000 mg | ORAL_TABLET | Freq: Two times a day (BID) | ORAL | Status: DC

## 2020-10-11 MED ORDER — POTASSIUM CHLORIDE CRYS ER 20 MEQ PO TBCR
40.0000 meq | EXTENDED_RELEASE_TABLET | Freq: Once | ORAL | Status: AC
  Administered 2020-10-11: 40 meq via ORAL
  Filled 2020-10-11: qty 2

## 2020-10-11 MED ORDER — FERROUS SULFATE 325 (65 FE) MG PO TABS
325.0000 mg | ORAL_TABLET | Freq: Every day | ORAL | Status: DC
  Administered 2020-10-11 – 2020-10-15 (×5): 325 mg via ORAL
  Filled 2020-10-11 (×5): qty 1

## 2020-10-11 MED ORDER — LORATADINE 10 MG PO TABS
10.0000 mg | ORAL_TABLET | Freq: Every day | ORAL | Status: DC
  Administered 2020-10-11 – 2020-10-15 (×5): 10 mg via ORAL
  Filled 2020-10-11 (×5): qty 1

## 2020-10-11 MED ORDER — ATORVASTATIN CALCIUM 80 MG PO TABS
80.0000 mg | ORAL_TABLET | Freq: Every day | ORAL | Status: DC
  Administered 2020-10-11 – 2020-10-14 (×4): 80 mg via ORAL
  Filled 2020-10-11 (×4): qty 1

## 2020-10-11 MED ORDER — LEVOTHYROXINE SODIUM 100 MCG PO TABS
100.0000 ug | ORAL_TABLET | Freq: Every day | ORAL | Status: DC
  Administered 2020-10-11 – 2020-10-15 (×5): 100 ug via ORAL
  Filled 2020-10-11 (×5): qty 1

## 2020-10-11 MED ORDER — MAGNESIUM SULFATE 2 GM/50ML IV SOLN
2.0000 g | Freq: Once | INTRAVENOUS | Status: AC
  Administered 2020-10-11: 2 g via INTRAVENOUS
  Filled 2020-10-11: qty 50

## 2020-10-11 NOTE — Progress Notes (Signed)
Pharmacy: Dofetilide (Tikosyn) - Follow Up Assessment and Electrolyte Replacement  Pharmacy consulted to assist in monitoring and replacing electrolytes in this 69 y.o. female admitted on 10/10/2020 undergoing dofetilide initiation. First dofetilide dose: 11/9@2000 .  Labs:    Component Value Date/Time   K 3.2 (L) 10/11/2020 0159   MG 1.9 10/11/2020 0159     Plan: Potassium: K < 3.5:  Give KCl 40 mEq q4hr x2   Magnesium: Mg 1.8-2: Give Mg 2 gm IV x1   Thank you for allowing pharmacy to participate in this patient's care   Antonietta Jewel, PharmD, [facility] Pharmacist  Phone: [phone] 10/11/2020 7:15 AM  Please check AMION for all [place] phone numbers After 10:00 PM, call [place] [phone]

## 2020-10-11 NOTE — Progress Notes (Signed)
This afternoon's EKG is reviewed with Dr. Rayann Heman QT remains prolonged No tikosyn tonight K+ was further supplemented  EKG labs in Am  Tommye Standard, PA-C

## 2020-10-11 NOTE — Progress Notes (Addendum)
Progress Note  Patient Name: Maria Fry Date of Encounter: 10/11/2020  [facility] Cardiologist:   Subjective   I feel good this morning, nausea is resolved after eating breakfast, no SOB, no DOE , no CP  Inpatient Medications    Scheduled Meds: . apixaban  5 mg Oral BID  . atorvastatin  80 mg Oral QHS  . calcium carbonate  1 tablet Oral BID WC  . ferrous sulfate  325 mg Oral Daily  . furosemide  40 mg Oral BID  . gabapentin  300 mg Oral BID  . levothyroxine  100 mcg Oral Q0600  . loratadine  10 mg Oral Daily  . metoprolol tartrate  25 mg Oral BID  . pantoprazole  40 mg Oral BID  . potassium chloride  40 mEq Oral Q3H  . ranolazine  500 mg Oral BID  . sodium chloride flush  3 mL Intravenous Q12H   Continuous Infusions: . sodium chloride    . magnesium sulfate bolus IVPB     PRN Meds: sodium chloride, acetaminophen, ALPRAZolam, nitroGLYCERIN, sodium chloride flush, zolpidem   Vital Signs    Vitals:   10/10/20 1300 10/10/20 1919 10/11/20 0542  BP: (!) 166/97 128/75 136/73  Pulse: 90 88   Resp: 16 18 18   Temp: 97.8 F (36.6 C) 97.7 F (36.5 C) (!) 97.1 F (36.2 C)  TempSrc: Oral Oral Oral  SpO2: 100% 95%   Weight: 99.2 kg  96.5 kg  Height: 5' (1.524 m)      Intake/Output Summary (Last 24 hours) at 10/11/2020 0735 Last data filed at 10/11/2020 0542 Gross per 24 hour  Intake --  Output 1500 ml  Net -1500 ml   Last 3 Weights 10/11/2020 10/10/2020 10/10/2020  Weight (lbs) 212 lb 12.8 oz 218 lb 9.6 oz 219 lb 3.2 oz  Weight (kg) 96.525 kg 99.156 kg 99.428 kg      Telemetry    SB/SR 50's-60's, occ PVCs, PACs, note she had sopme PVCs on admission as well, prior to drug - Personally Reviewed  ECG    SB 58bpm, PVCs, manually measured QT 57ms, QTc 561ms - Personally Reviewed  Physical Exam   GEN: No acute distress.   Neck: No JVD Cardiac: RRR, no murmurs, rubs, or gallops.  Respiratory: CTA b/l. GI: Soft, nontender, non-distended  MS: brawny  type edema, trace pitting; No deformity. Neuro:  Nonfocal  Psych: Normal affect   Labs    High Sensitivity Troponin:  No results for input(s): TROPONINIHS in the last 720 hours.    Chemistry Recent Labs  Lab 10/05/20 1022 10/10/20 1026 10/11/20 0159  NA 139 134* 135  K 3.7 4.3 3.2*  CL 105 99 99  CO2 26 26 26   GLUCOSE 108* 110* 94  BUN 14 18 16   CREATININE 1.10* 1.13* 1.16*  CALCIUM 8.4* 8.4* 8.2*  GFRNONAA 55* 53* 52*  ANIONGAP 8 9 10      HematologyNo results for input(s): WBC, RBC, HGB, HCT, MCV, MCH, MCHC, RDW, PLT in the last 168 hours.  BNPNo results for input(s): BNP, PROBNP in the last 168 hours.   DDimer No results for input(s): DDIMER in the last 168 hours.   Radiology    No results found.  Cardiac Studies   Echocardiogram 08/29/2020 1. Left ventricular ejection fraction, by estimation, is 40 to 45%. The left ventricle has mildly decreased function. The left ventricle demonstrates global hypokinesis. There is severe left ventricular hypertrophy. Left ventricular diastolic parameters are indeterminate. 2. Right  ventricular systolic function is normal. The right ventricular size is normal. There is moderately elevated pulmonary artery systolic pressure. 3. Left atrial size was severely dilated. 4. Right atrial size was severely dilated. 5. The mitral valve is normal in structure. Mild to moderate mitral valve regurgitation. No evidence of mitral stenosis. 6. Tricuspid valve regurgitation is moderate. 7. The aortic valve is tricuspid. Aortic valve regurgitation is mild to moderate. 8. Moderate pulmonary HTN, PASP is 50 mmHg. 9. The inferior vena cava is dilated in size with <50% respiratory variability, suggesting right atrial pressure of 15 mmHg.   ECHO:01/14/2020 1. Left ventricular ejection fraction, by estimation, is 60 to 65%. The  left ventricle has normal function. The left ventricle has no regional  wall motion abnormalities. There is  severely increased left ventricular  hypertrophy. Left ventricular  diastolic parameters are consistent with Grade I diastolic dysfunction  (impaired relaxation).  2. Right ventricular systolic function is normal. The right ventricular  size is normal. There is mildly elevated pulmonary artery systolic  pressure.  3. Left atrial size was severely dilated.  4. Right atrial size was severely dilated.  5. The mitral valve is normal in structure and function. Mild mitral  valve regurgitation. No evidence of mitral stenosis.  6. The aortic valve is tricuspid. Aortic valve regurgitation is not  visualized. No aortic stenosis is present.  7. The inferior vena cava is normal in size with <50% respiratory  variability, suggesting right atrial pressure of 8 mmHg.   CATH:06/06/2015  Dist LAD lesion, 100% stenosed.  Mid LAD lesion, 10% stenosed.  The left ventricular systolic function is normal.  1. 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 Good LV function  Plan: DAPT, beta blocker and risk factor modification.  Patient Profile     69 y.o. female w/PMHx of CAD (06/07/15 NSTEMI/Cath: Dist LAD 100%, mid LAD 10%. Otw nl cors. Nl EF w/ inferoapical AK), chronic combined systolic and diastolic CHF, HLD, HTN, hypothyroidism, gastritis, and persistent atrial fibrillation admitted for Tikosyn initiation  Assessment & Plan    1. Persistent AFib     CHA2DS2Vasc is 5, on Eliquis, appropriately dosed     LA is 19mm     Tikosyn load in progress     K+ 3.2 (getting lasix) >> replacement ordered     Mag 1.9 >> replacement ordered     Creat 1.16     QT is long  She has converted to SB/SR 50's-60's QT is prolonged Hold tikosyn this AM and recheck QT this morning is improved, will repeat EKG afternoon, pending this for further Tikosyn orders, if much improved, may consider evening dose at lower strength She has occ PVCs (pre-drug as well)  She can tell the difference  this AM, walking to/from the bathroom and washing up this AM, usually would make her winded, she feels much better this and had no SOB this AM.    2. Nausea 3. Known h/o gastritis     On protonix (low likely hood of QT prolongation)     Zofran and phenergan ordered by night coverage, she did NOT get any, and was d/c'd this AM      No active nausea this AM, feels better after eating      No BM here but says her stool has cleared up without recurrent melena  4. Chronic combined CHF     Hold this AM lasix while replacing K+, plan to resume tonight once K+ up some  Follow labs     No SOB this AM  5. CAD     On statin, BB, Ranexa      She got ranexa this AM about midnight >> watch QT, will stop for now   For questions or updates, please contact [facility] Please consult [email or web address] for contact info under        Signed, Baldwin Jamaica, PA-C  10/11/2020, 7:35 AM      I have seen, examined the patient, and reviewed the above assessment and plan.  Changes to above are made where necessary.  On exam, RRR.  Her qt is markedly prolonged.  We will hold tikosyn today and reevaluate tomorrow.  Co Sign: Thompson Grayer, MD

## 2020-10-11 NOTE — Plan of Care (Signed)
  Problem: Clinical Measurements: Goal: Will remain free from infection Outcome: Progressing   Problem: Clinical Measurements: Goal: Respiratory complications will improve Outcome: Progressing   

## 2020-10-11 NOTE — Care Management (Signed)
Case manager spoke with patient concerning cost for her Tikosyn, $90.00. Patient says she can afford it. TOC pharmacy can fill initial prescription. Case Manager contacted patient's [facility] in [place], they do not have Tikosyn in [place] but will order for patient when needed. TOC Team will continue to monitor. Ricki Miller, RN BSN Case Manager [phone]

## 2020-10-11 NOTE — TOC Benefit Eligibility Note (Signed)
Transition of Care [facility]) Benefit Eligibility Note    Patient Details  Name: Maria Fry MRN: [id number] Date of Birth: [birth year]   Medication/Dose: TIKOSYN(dofetilide) 532mcg,250mcg,125mcg  bid for 30 day supply  Covered?: Yes  Tier:  (tier 4)  Prescription Coverage Preferred Pharmacy: [facility] with Person/Company/Phone Number:: [facility]. [facility],PH# [phone]  Co-Pay: $90.00  Prior Approval: No  Deductible:  (no deductible)       Shelda Altes Phone Number: 10/11/2020, 9:32 AM

## 2020-10-12 ENCOUNTER — Encounter (HOSPITAL_COMMUNITY)
Admission: RE | Disposition: A | Discharge status: Home / Self Care | Payer: Self-pay | Source: Home / Self Care | Attending: Internal Medicine

## 2020-10-12 DIAGNOSIS — I4819 Other persistent atrial fibrillation: Secondary | ICD-10-CM | POA: Diagnosis not present

## 2020-10-12 LAB — BASIC METABOLIC PANEL
Anion gap: 9 (ref 5–15)
BUN: 14 mg/dL (ref 8–23)
CO2: 24 mmol/L (ref 22–32)
Calcium: 8.1 mg/dL — ABNORMAL LOW (ref 8.9–10.3)
Chloride: 105 mmol/L (ref 98–111)
Creatinine, Ser: 1.04 mg/dL — ABNORMAL HIGH (ref 0.44–1.00)
GFR, Estimated: 59 mL/min — ABNORMAL LOW (ref >60)
Glucose, Bld: 93 mg/dL (ref 70–99)
Potassium: 4.2 mmol/L (ref 3.5–5.1)
Sodium: 138 mmol/L (ref 135–145)

## 2020-10-12 LAB — MAGNESIUM: Magnesium: 2.2 mg/dL (ref 1.7–2.4)

## 2020-10-12 SURGERY — CARDIOVERSION
Anesthesia: General

## 2020-10-12 MED ORDER — POTASSIUM CHLORIDE CRYS ER 20 MEQ PO TBCR
40.0000 meq | EXTENDED_RELEASE_TABLET | Freq: Every day | ORAL | Status: DC
  Administered 2020-10-12: 40 meq via ORAL
  Filled 2020-10-12: qty 2

## 2020-10-12 MED ORDER — RANOLAZINE ER 500 MG PO TB12
500.0000 mg | ORAL_TABLET | Freq: Two times a day (BID) | ORAL | Status: DC
  Administered 2020-10-12 – 2020-10-15 (×7): 500 mg via ORAL
  Filled 2020-10-12 (×7): qty 1

## 2020-10-12 MED ORDER — FUROSEMIDE 40 MG PO TABS
40.0000 mg | ORAL_TABLET | Freq: Every day | ORAL | Status: DC
  Administered 2020-10-12 – 2020-10-15 (×4): 40 mg via ORAL
  Filled 2020-10-12 (×4): qty 1

## 2020-10-12 MED ORDER — DOFETILIDE 125 MCG PO CAPS
125.0000 ug | ORAL_CAPSULE | Freq: Two times a day (BID) | ORAL | Status: DC
  Administered 2020-10-12 – 2020-10-15 (×7): 125 ug via ORAL
  Filled 2020-10-12 (×8): qty 1

## 2020-10-12 NOTE — Progress Notes (Addendum)
Progress Note  Patient Name: Maria Fry Date of Encounter: 10/12/2020  [facility] Cardiologist: Dr. Duard Brady (new)  Subjective   Feels well today, no SOB, no nausea  Inpatient Medications    Scheduled Meds: . apixaban  5 mg Oral BID  . atorvastatin  80 mg Oral QHS  . calcium carbonate  1 tablet Oral BID WC  . ferrous sulfate  325 mg Oral Q breakfast  . furosemide  40 mg Oral Daily  . gabapentin  300 mg Oral BID  . levothyroxine  100 mcg Oral Q0600  . loratadine  10 mg Oral Daily  . metoprolol tartrate  25 mg Oral BID  . pantoprazole  40 mg Oral BID  . potassium chloride  40 mEq Oral Daily  . sodium chloride flush  3 mL Intravenous Q12H   Continuous Infusions: . sodium chloride     PRN Meds: sodium chloride, acetaminophen, ALPRAZolam, nitroGLYCERIN, sodium chloride flush, zolpidem   Vital Signs    Vitals:   10/11/20 0542 10/11/20 1438 10/11/20 2200 10/12/20 0603  BP: 136/73 121/75 135/79 135/77  Pulse:  (!) 59  67  Resp: 18 20    Temp: (!) 97.1 F (36.2 C) 98.4 F (36.9 C) 98 F (36.7 C) 97.6 F (36.4 C)  TempSrc: Oral Oral Oral Oral  SpO2:  99%    Weight: 96.5 kg   97.2 kg  Height:        Intake/Output Summary (Last 24 hours) at 10/12/2020 0829 Last data filed at 10/11/2020 2230 Gross per 24 hour  Intake 1200 ml  Output 700 ml  Net 500 ml   Last 3 Weights 10/12/2020 10/11/2020 10/10/2020  Weight (lbs) 214 lb 4.8 oz 212 lb 12.8 oz 218 lb 9.6 oz  Weight (kg) 97.206 kg 96.525 kg 99.156 kg      Telemetry    SB/SR 50's-60's - Personally Reviewed  ECG    SR 66bpm, agree with tracing, QT 444, QTc 465 - Personally Reviewed  Physical Exam   GEN: No acute distress.   Neck: No JVD Cardiac: RRR, no murmurs, rubs, or gallops.  Respiratory: CTA b/l. GI: Soft, nontender, non-distended  MS: brawny type edema, trace pitting; No deformity. Neuro:  Nonfocal  Psych: Normal affect   Labs    High Sensitivity Troponin:  No results for input(s):  TROPONINIHS in the last 720 hours.    Chemistry Recent Labs  Lab 10/11/20 0159 10/11/20 1307 10/12/20 0345  NA 135 135 138  K 3.2* 3.9 4.2  CL 99 100 105  CO2 26 26 24   GLUCOSE 94 112* 93  BUN 16 17 14   CREATININE 1.16* 1.27* 1.04*  CALCIUM 8.2* 8.2* 8.1*  GFRNONAA 52* 46* 59*  ANIONGAP 10 9 9      HematologyNo results for input(s): WBC, RBC, HGB, HCT, MCV, MCH, MCHC, RDW, PLT in the last 168 hours.  BNPNo results for input(s): BNP, PROBNP in the last 168 hours.   DDimer No results for input(s): DDIMER in the last 168 hours.   Radiology    No results found.  Cardiac Studies   Echocardiogram 08/29/2020 1. Left ventricular ejection fraction, by estimation, is 40 to 45%. The left ventricle has mildly decreased function. The left ventricle demonstrates global hypokinesis. There is severe left ventricular hypertrophy. Left ventricular diastolic parameters are indeterminate. 2. Right ventricular systolic function is normal. The right ventricular size is normal. There is moderately elevated pulmonary artery systolic pressure. 3. Left atrial size was severely dilated. 4.  Right atrial size was severely dilated. 5. The mitral valve is normal in structure. Mild to moderate mitral valve regurgitation. No evidence of mitral stenosis. 6. Tricuspid valve regurgitation is moderate. 7. The aortic valve is tricuspid. Aortic valve regurgitation is mild to moderate. 8. Moderate pulmonary HTN, PASP is 50 mmHg. 9. The inferior vena cava is dilated in size with <50% respiratory variability, suggesting right atrial pressure of 15 mmHg.   ECHO:01/14/2020 1. Left ventricular ejection fraction, by estimation, is 60 to 65%. The  left ventricle has normal function. The left ventricle has no regional  wall motion abnormalities. There is severely increased left ventricular  hypertrophy. Left ventricular  diastolic parameters are consistent with Grade I diastolic dysfunction  (impaired  relaxation).  2. Right ventricular systolic function is normal. The right ventricular  size is normal. There is mildly elevated pulmonary artery systolic  pressure.  3. Left atrial size was severely dilated.  4. Right atrial size was severely dilated.  5. The mitral valve is normal in structure and function. Mild mitral  valve regurgitation. No evidence of mitral stenosis.  6. The aortic valve is tricuspid. Aortic valve regurgitation is not  visualized. No aortic stenosis is present.  7. The inferior vena cava is normal in size with <50% respiratory  variability, suggesting right atrial pressure of 8 mmHg.   CATH:06/06/2015  Dist LAD lesion, 100% stenosed.  Mid LAD lesion, 10% stenosed.  The left ventricular systolic function is normal.  1. 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 Good LV function  Plan: DAPT, beta blocker and risk factor modification.  Patient Profile     69 y.o. female w/PMHx of CAD (06/07/15 NSTEMI/Cath: Dist LAD 100%, mid LAD 10%. Otw nl cors. Nl EF w/ inferoapical AK), chronic combined systolic and diastolic CHF, HLD, HTN, hypothyroidism, gastritis, and persistent atrial fibrillation admitted for Tikosyn initiation  Assessment & Plan    1. Persistent AFib     CHA2DS2Vasc is 5, on Eliquis, appropriately dosed     LA is 68mm     Tikosyn load in progress     K+ 4.2     Mag 2.2     Creat 1.04, stable      She had marked QT prolongation after only one dose of 555mcg Tikosyn QT is better this morning  Ranexa seems to be a medication that has done well with her in controling her angina, we will retry Tikosyn at 146mcg and resume her ranexa (brand only by chart, discussed with pharmacist they will not fill ranolazine, the patient is having her family bring her home brand ranexa and once it arrives, will get it resumed, I have made RN aware as well.  I note that she got ranolazine here without any clear reaction, though will not  give generic going forward)  Discussed with the patient    2. Nausea 3. Known h/o gastritis     On protonix (low likely hood of QT prolongation)     Zofran and phenergan ordered by night coverage, she did NOT get any, and was d/c'd this AM      No active nausea this AM, feels better after eating      No BM here but says her stool has cleared up without recurrent melena  4. Chronic combined CHF     Resume lasix w/K+ supplementation     Follow labs     No SOB this AM  5. CAD     On statin,  BB, Ranexa      She got ranexa this AM about midnight >> watch QT, will stop for now  She has a known occl distal LAD by cath 2016, treated medically She states prior to the Ranexa was having daily angina and since taking the ranexa she has none.   For questions or updates, please contact [place] Please consult [email or web address] for contact info under        Signed, Baldwin Jamaica, PA-C  10/12/2020, 8:29 AM     I have seen, examined the patient, and reviewed the above assessment and plan.  Changes to above are made where necessary.  On exam, RRR.  QT has improved.  Stable with tikosyn 125 mcg bid.  No arrhythmias  Co Sign: Thompson Grayer, MD

## 2020-10-12 NOTE — Progress Notes (Signed)
Pharmacy: Dofetilide (Tikosyn) - Follow Up Assessment and Electrolyte Replacement  Pharmacy consulted to assist in monitoring and replacing electrolytes in this 69 y.o. female admitted on 10/10/2020 undergoing dofetilide initiation. First dofetilide dose: 11/9@2000 .  Labs:    Component Value Date/Time   K 4.2 10/12/2020 0345   MG 2.2 10/12/2020 0345     Plan: Potassium: K >/= 4: No additional supplementation needed  Magnesium: Mg > 2: No additional supplementation needed  Thank you for allowing pharmacy to participate in this patient's care   Antonietta Jewel, PharmD, [place] Pharmacist  Phone: [phone] 10/12/2020 8:56 AM  Please check AMION for all [place] phone numbers After 10:00 PM, call [place] [phone]

## 2020-10-13 ENCOUNTER — Ambulatory Visit: Payer: PPO | Admitting: Student

## 2020-10-13 ENCOUNTER — Other Ambulatory Visit (HOSPITAL_COMMUNITY): Payer: Self-pay | Admitting: Physician Assistant

## 2020-10-13 DIAGNOSIS — I4819 Other persistent atrial fibrillation: Secondary | ICD-10-CM | POA: Diagnosis not present

## 2020-10-13 LAB — BASIC METABOLIC PANEL
Anion gap: 6 (ref 5–15)
BUN: 10 mg/dL (ref 8–23)
CO2: 28 mmol/L (ref 22–32)
Calcium: 8.4 mg/dL — ABNORMAL LOW (ref 8.9–10.3)
Chloride: 103 mmol/L (ref 98–111)
Creatinine, Ser: 1.08 mg/dL — ABNORMAL HIGH (ref 0.44–1.00)
GFR, Estimated: 56 mL/min — ABNORMAL LOW (ref >60)
Glucose, Bld: 123 mg/dL — ABNORMAL HIGH (ref 70–99)
Potassium: 3.9 mmol/L (ref 3.5–5.1)
Sodium: 137 mmol/L (ref 135–145)

## 2020-10-13 LAB — MAGNESIUM: Magnesium: 1.8 mg/dL (ref 1.7–2.4)

## 2020-10-13 MED ORDER — DOFETILIDE 125 MCG PO CAPS
125.0000 ug | ORAL_CAPSULE | Freq: Two times a day (BID) | ORAL | 0 refills | Status: DC
Start: 2020-10-13 — End: 2020-10-15

## 2020-10-13 MED ORDER — FUROSEMIDE 40 MG PO TABS
40.0000 mg | ORAL_TABLET | Freq: Once | ORAL | Status: AC
  Administered 2020-10-13: 40 mg via ORAL
  Filled 2020-10-13: qty 1

## 2020-10-13 MED ORDER — POTASSIUM CHLORIDE CRYS ER 20 MEQ PO TBCR
40.0000 meq | EXTENDED_RELEASE_TABLET | Freq: Once | ORAL | Status: AC
  Administered 2020-10-13: 40 meq via ORAL
  Filled 2020-10-13: qty 2

## 2020-10-13 MED ORDER — POLYETHYLENE GLYCOL 3350 17 G PO PACK
17.0000 g | PACK | Freq: Once | ORAL | Status: AC
  Administered 2020-10-13: 17 g via ORAL
  Filled 2020-10-13: qty 1

## 2020-10-13 MED ORDER — MAGNESIUM SULFATE 2 GM/50ML IV SOLN
2.0000 g | Freq: Once | INTRAVENOUS | Status: AC
  Administered 2020-10-13: 2 g via INTRAVENOUS
  Filled 2020-10-13: qty 50

## 2020-10-13 MED ORDER — ATROPINE SULFATE 1 MG/10ML IJ SOSY
PREFILLED_SYRINGE | INTRAMUSCULAR | Status: AC
  Filled 2020-10-13: qty 10

## 2020-10-13 MED ORDER — POTASSIUM CHLORIDE CRYS ER 20 MEQ PO TBCR
40.0000 meq | EXTENDED_RELEASE_TABLET | Freq: Every day | ORAL | Status: DC
  Administered 2020-10-13 – 2020-10-15 (×3): 40 meq via ORAL
  Filled 2020-10-13 (×3): qty 2

## 2020-10-13 MED FILL — DOFETILIDE 125 MCG CAPS: 125 | 7 days supply | Qty: 14 | Fill #0

## 2020-10-13 NOTE — Progress Notes (Signed)
Pharmacy: Dofetilide (Tikosyn) - Follow Up Assessment and Electrolyte Replacement  Pharmacy consulted to assist in monitoring and replacing electrolytes in this 69 y.o. female admitted on 10/10/2020 undergoing dofetilide initiation. First dofetilide dose: 11/9@2000 .  Labs:    Component Value Date/Time   K 3.9 10/13/2020 0319   MG 1.8 10/13/2020 0319     Plan: Potassium: K 3.8-3.9:  Give KCl 40 mEq po x1   Magnesium: Mg 1.8-2: Give Mg 2 gm IV x1   Thank you for allowing pharmacy to participate in this patient's care   Antonietta Jewel, PharmD, [place] Pharmacist  Phone: [phone] 10/13/2020 7:16 AM  Please check AMION for all [place] phone numbers After 10:00 PM, call [place] [phone]

## 2020-10-13 NOTE — Progress Notes (Addendum)
Progress Note  Patient Name: Maria Fry Date of Encounter: 10/13/2020  [facility] Cardiologist: Dr. Duard Brady (new)  Subjective   Continues to feel well, no SOB, no nausea.  Mentions she has not had a BM in days and is unusual for her, would like something  Inpatient Medications    Scheduled Meds: . apixaban  5 mg Oral BID  . atorvastatin  80 mg Oral QHS  . calcium carbonate  1 tablet Oral BID WC  . dofetilide  125 mcg Oral BID  . ferrous sulfate  325 mg Oral Q breakfast  . furosemide  40 mg Oral Daily  . gabapentin  300 mg Oral BID  . levothyroxine  100 mcg Oral Q0600  . loratadine  10 mg Oral Daily  . metoprolol tartrate  25 mg Oral BID  . pantoprazole  40 mg Oral BID  . potassium chloride  40 mEq Oral Daily  . ranolazine  500 mg Oral BID  . sodium chloride flush  3 mL Intravenous Q12H   Continuous Infusions: . sodium chloride    . magnesium sulfate bolus IVPB     PRN Meds: sodium chloride, acetaminophen, ALPRAZolam, nitroGLYCERIN, sodium chloride flush, zolpidem   Vital Signs    Vitals:   10/12/20 2030 10/12/20 2346 10/13/20 0428 10/13/20 0748  BP: 137/76 129/83 137/73 (!) 126/58  Pulse:  62 70 68  Resp:  18 18 17   Temp:  98.7 F (37.1 C) 98.7 F (37.1 C) 97.8 F (36.6 C)  TempSrc:  Oral Oral Oral  SpO2:  98% 99%   Weight:   97.9 kg   Height:        Intake/Output Summary (Last 24 hours) at 10/13/2020 0808 Last data filed at 10/13/2020 0554 Gross per 24 hour  Intake 240 ml  Output 850 ml  Net -610 ml   Last 3 Weights 10/13/2020 10/12/2020 10/11/2020  Weight (lbs) 215 lb 12.2 oz 214 lb 4.8 oz 212 lb 12.8 oz  Weight (kg) 97.87 kg 97.206 kg 96.525 kg      Telemetry    SB/SR 60's - Personally Reviewed  ECG    SR 63bpm, agree with tracing, QT 430, QTc 440 - Personally Reviewed with Dr. Rayann Heman  Physical Exam   GEN: No acute distress.   Neck: No JVD Cardiac: RRR, no murmurs, rubs, or gallops.  Respiratory: CTA b/l. GI: Soft, nontender,  non-distended  MS: brawny type edema, trace pitting; No deformity. Neuro:  Nonfocal  Psych: Normal affect   Labs    High Sensitivity Troponin:  No results for input(s): TROPONINIHS in the last 720 hours.    Chemistry Recent Labs  Lab 10/11/20 1307 10/12/20 0345 10/13/20 0319  NA 135 138 137  K 3.9 4.2 3.9  CL 100 105 103  CO2 26 24 28   GLUCOSE 112* 93 123*  BUN 17 14 10   CREATININE 1.27* 1.04* 1.08*  CALCIUM 8.2* 8.1* 8.4*  GFRNONAA 46* 59* 56*  ANIONGAP 9 9 6      HematologyNo results for input(s): WBC, RBC, HGB, HCT, MCV, MCH, MCHC, RDW, PLT in the last 168 hours.  BNPNo results for input(s): BNP, PROBNP in the last 168 hours.   DDimer No results for input(s): DDIMER in the last 168 hours.   Radiology    No results found.  Cardiac Studies   Echocardiogram 08/29/2020 1. Left ventricular ejection fraction, by estimation, is 40 to 45%. The left ventricle has mildly decreased function. The left ventricle demonstrates global hypokinesis.  There is severe left ventricular hypertrophy. Left ventricular diastolic parameters are indeterminate. 2. Right ventricular systolic function is normal. The right ventricular size is normal. There is moderately elevated pulmonary artery systolic pressure. 3. Left atrial size was severely dilated. 4. Right atrial size was severely dilated. 5. The mitral valve is normal in structure. Mild to moderate mitral valve regurgitation. No evidence of mitral stenosis. 6. Tricuspid valve regurgitation is moderate. 7. The aortic valve is tricuspid. Aortic valve regurgitation is mild to moderate. 8. Moderate pulmonary HTN, PASP is 50 mmHg. 9. The inferior vena cava is dilated in size with <50% respiratory variability, suggesting right atrial pressure of 15 mmHg.   ECHO:01/14/2020 1. Left ventricular ejection fraction, by estimation, is 60 to 65%. The  left ventricle has normal function. The left ventricle has no regional  wall motion  abnormalities. There is severely increased left ventricular  hypertrophy. Left ventricular  diastolic parameters are consistent with Grade I diastolic dysfunction  (impaired relaxation).  2. Right ventricular systolic function is normal. The right ventricular  size is normal. There is mildly elevated pulmonary artery systolic  pressure.  3. Left atrial size was severely dilated.  4. Right atrial size was severely dilated.  5. The mitral valve is normal in structure and function. Mild mitral  valve regurgitation. No evidence of mitral stenosis.  6. The aortic valve is tricuspid. Aortic valve regurgitation is not  visualized. No aortic stenosis is present.  7. The inferior vena cava is normal in size with <50% respiratory  variability, suggesting right atrial pressure of 8 mmHg.   CATH:06/06/2015  Dist LAD lesion, 100% stenosed.  Mid LAD lesion, 10% stenosed.  The left ventricular systolic function is normal.  1. 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 Good LV function  Plan: DAPT, beta blocker and risk factor modification.  Patient Profile     69 y.o. female w/PMHx of CAD (06/07/15 NSTEMI/Cath: Dist LAD 100%, mid LAD 10%. Otw nl cors. Nl EF w/ inferoapical AK), chronic combined systolic and diastolic CHF, HLD, HTN, hypothyroidism, gastritis, and persistent atrial fibrillation admitted for Tikosyn initiation  Assessment & Plan    1. Persistent AFib     CHA2DS2Vasc is 5, on Eliquis, appropriately dosed     LA is 40mm     Tikosyn load in progress     K+ 3.9, replacement ordered     Mag 1.8, replacement ordered     Creat 1.08, stable      She had marked QT prolongation after only one dose of 561mcg Tikosyn Started on 14mcg yesterday, got 2 doses, QTc stable Continue   Discussed her stay will be longer then expected, anticipate discharge tomorrow She will need home electrolyte adjustment most likely requiring both mag and K+ supplementation  here  I have called her pharmacy, they do not have dofetilide 190mcg in stock. In discussion with pharmacist today, [facility] pharmacy will be unable to accommodate 1st fill in Saturday or put aside for weekend discharge. In this case I was told our in patient pharmacy will provide a no cost 7 day supply to the patient at time of discharge to give her out patient pharmacy time to fill her prescription   2. Nausea 3. Known h/o gastritis     On protonix (low likely hood of QT prolongation)     No ongoing nausea here        4. Chronic combined CHF     No SOB since SR, brawny type  edema appreciated without notable pitting edema     Getting daily lasix here (home alternates days with daily and BID)   5. CAD     On statin, BB, Ranexa      She has a known occl distal LAD by cath 2016, treated medically She states prior to the Ranexa was having daily angina and since taking the ranexa she has none, resumed yesterday  6. Constipation     She would like something to help with BM today     mirilax ordered, encouraged ambulation   For questions or updates, please contact [place] Please consult [email or web address] for contact info under        Signed, Baldwin Jamaica, PA-C  10/13/2020, 8:08 AM    I have seen, examined the patient, and reviewed the above assessment and plan.  Changes to above are made where necessary.  On exam, RRR.  Qt remains stable with tikosyn.  We will follow closely while here.  Co Sign: Thompson Grayer, MD 10/13/2020 1:27 PM

## 2020-10-14 DIAGNOSIS — I4819 Other persistent atrial fibrillation: Secondary | ICD-10-CM | POA: Diagnosis not present

## 2020-10-14 LAB — BASIC METABOLIC PANEL
Anion gap: 6 (ref 5–15)
BUN: 8 mg/dL (ref 8–23)
CO2: 30 mmol/L (ref 22–32)
Calcium: 8.3 mg/dL — ABNORMAL LOW (ref 8.9–10.3)
Chloride: 100 mmol/L (ref 98–111)
Creatinine, Ser: 1.12 mg/dL — ABNORMAL HIGH (ref 0.44–1.00)
GFR, Estimated: 54 mL/min — ABNORMAL LOW (ref >60)
Glucose, Bld: 150 mg/dL — ABNORMAL HIGH (ref 70–99)
Potassium: 3.9 mmol/L (ref 3.5–5.1)
Sodium: 136 mmol/L (ref 135–145)

## 2020-10-14 LAB — MAGNESIUM: Magnesium: 2.1 mg/dL (ref 1.7–2.4)

## 2020-10-14 MED ORDER — POTASSIUM CHLORIDE CRYS ER 20 MEQ PO TBCR
40.0000 meq | EXTENDED_RELEASE_TABLET | Freq: Once | ORAL | Status: AC
  Administered 2020-10-14: 40 meq via ORAL
  Filled 2020-10-14: qty 2

## 2020-10-14 MED ORDER — MAGNESIUM HYDROXIDE 400 MG/5ML PO SUSP
30.0000 mL | Freq: Every day | ORAL | Status: DC | PRN
  Administered 2020-10-14: 30 mL via ORAL
  Filled 2020-10-14: qty 30

## 2020-10-14 NOTE — Progress Notes (Signed)
Pharmacy: Dofetilide (Tikosyn) - Follow Up Assessment and Electrolyte Replacement  Pharmacy consulted to assist in monitoring and replacing electrolytes in this 69 y.o. female admitted on 10/10/2020 undergoing dofetilide initiation. First dofetilide dose: 11/9 @ 2000  Labs:    Component Value Date/Time   K 3.9 10/13/2020 0319   MG 1.8 10/13/2020 0319     Plan: Potassium: K 3.8-3.9:  Give KCl 40 mEq po x1   Magnesium: Mg > 2: No additional supplementation needed  Thank you for allowing pharmacy to participate in this patient's care   Shauna Hugh, PharmD, [place]  PGY-1 Pharmacy Resident 10/14/2020 8:36 AM  Please check [email or web address] for unit-specific pharmacy phone numbers.

## 2020-10-14 NOTE — Progress Notes (Signed)
Progress Note   Subjective   Doing well today, the patient denies CP or SOB.  No new concerns  Inpatient Medications    Scheduled Meds: . apixaban  5 mg Oral BID  . atorvastatin  80 mg Oral QHS  . calcium carbonate  1 tablet Oral BID WC  . dofetilide  125 mcg Oral BID  . ferrous sulfate  325 mg Oral Q breakfast  . furosemide  40 mg Oral Daily  . gabapentin  300 mg Oral BID  . levothyroxine  100 mcg Oral Q0600  . loratadine  10 mg Oral Daily  . metoprolol tartrate  25 mg Oral BID  . pantoprazole  40 mg Oral BID  . potassium chloride  40 mEq Oral Daily  . potassium chloride  40 mEq Oral Once  . ranolazine  500 mg Oral BID  . sodium chloride flush  3 mL Intravenous Q12H   Continuous Infusions: . sodium chloride     PRN Meds: sodium chloride, acetaminophen, ALPRAZolam, nitroGLYCERIN, sodium chloride flush, zolpidem   Vital Signs    Vitals:   10/13/20 1958 10/14/20 0526 10/14/20 0737 10/14/20 0843  BP: (!) 143/81 (!) 117/46 121/67 122/71  Pulse: 72 65 68 67  Resp: 18 18 18 18   Temp: 98.4 F (36.9 C) 98.7 F (37.1 C) 97.6 F (36.4 C) 97.9 F (36.6 C)  TempSrc: Oral Oral Oral Oral  SpO2: 98% 99% 98%   Weight:  98.7 kg    Height:        Intake/Output Summary (Last 24 hours) at 10/14/2020 1047 Last data filed at 10/14/2020 0900 Gross per 24 hour  Intake 770 ml  Output 900 ml  Net -130 ml   Filed Weights   10/12/20 0603 10/13/20 0428 10/14/20 0526  Weight: 97.2 kg 97.9 kg 98.7 kg    Telemetry    Sinus, no arrhythmias - Personally Reviewed  Physical Exam   GEN- The patient is well appearing, alert and oriented x 3 today.   Head- normocephalic, atraumatic Eyes-  Sclera clear, conjunctiva pink Ears- hearing intact Oropharynx- clear Neck- supple, Lungs-  normal work of breathing Heart- Regular rate and rhythm  GI- soft  Extremities- no clubbing, cyanosis, or edema  MS- no significant deformity or atrophy Skin- no rash or lesion Psych- euthymic  mood, full affect Neuro- strength and sensation are intact   Labs    Chemistry Recent Labs  Lab 10/12/20 0345 10/13/20 0319 10/14/20 0724  NA 138 137 136  K 4.2 3.9 3.9  CL 105 103 100  CO2 24 28 30   GLUCOSE 93 123* 150*  BUN 14 10 8   CREATININE 1.04* 1.08* 1.12*  CALCIUM 8.1* 8.4* 8.3*  GFRNONAA 59* 56* 54*  ANIONGAP 9 6 6      HematologyNo results for input(s): WBC, RBC, HGB, HCT, MCV, MCH, MCHC, RDW, PLT in the last 168 hours.   Patient ID      69 y.o. female w/PMHx of CAD (06/07/15 NSTEMI/Cath: Dist LAD 100%, mid LAD 10%. Otw nl cors. Nl EF w/ inferoapical AK), chronic combined systolic and diastolic CHF, HLD, HTN, hypothyroidism, gastritis, and persistent atrial fibrillation admitted for Tikosyn initiation Assessment & Plan    1.  Persistent afib Qt is stable.  We will observe until tomorrow (after 6th dose).  If qt remains stable then we will plan discharge with close ouatpeitn follow-up. Continue eliquis  2. CAD Angina previously controlled with ranexa.  She reports that she has consistently had CP whenever  it was stopped in the past. We will therefore continue ranexa at this time.  3. Chronic combined systolic and diastolic CHF Hopefully will improve with sinus rhythm  4. Obesity Body mass index is 42.5 kg/m. lifesyle modification is advised  If qt is stable, we will discharge tomorrow am with follow-up in the AF clinic on Thursday or frida.  Thompson Grayer MD, [facility] 10/14/2020 10:47 AM

## 2020-10-15 DIAGNOSIS — I2510 Atherosclerotic heart disease of native coronary artery without angina pectoris: Secondary | ICD-10-CM

## 2020-10-15 DIAGNOSIS — I5042 Chronic combined systolic (congestive) and diastolic (congestive) heart failure: Secondary | ICD-10-CM | POA: Diagnosis not present

## 2020-10-15 DIAGNOSIS — Z8719 Personal history of other diseases of the digestive system: Secondary | ICD-10-CM

## 2020-10-15 DIAGNOSIS — I4819 Other persistent atrial fibrillation: Secondary | ICD-10-CM | POA: Diagnosis not present

## 2020-10-15 DIAGNOSIS — K5900 Constipation, unspecified: Secondary | ICD-10-CM

## 2020-10-15 LAB — BASIC METABOLIC PANEL
Anion gap: 6 (ref 5–15)
BUN: 9 mg/dL (ref 8–23)
CO2: 31 mmol/L (ref 22–32)
Calcium: 8.7 mg/dL — ABNORMAL LOW (ref 8.9–10.3)
Chloride: 100 mmol/L (ref 98–111)
Creatinine, Ser: 1.27 mg/dL — ABNORMAL HIGH (ref 0.44–1.00)
GFR, Estimated: 46 mL/min — ABNORMAL LOW (ref >60)
Glucose, Bld: 113 mg/dL — ABNORMAL HIGH (ref 70–99)
Potassium: 4.4 mmol/L (ref 3.5–5.1)
Sodium: 137 mmol/L (ref 135–145)

## 2020-10-15 LAB — MAGNESIUM: Magnesium: 1.9 mg/dL (ref 1.7–2.4)

## 2020-10-15 MED ORDER — MAGNESIUM SULFATE 2 GM/50ML IV SOLN
2.0000 g | Freq: Once | INTRAVENOUS | Status: AC
  Administered 2020-10-15: 2 g via INTRAVENOUS
  Filled 2020-10-15: qty 50

## 2020-10-15 MED ORDER — FLEET ENEMA 7-19 GM/118ML RE ENEM
1.0000 | ENEMA | Freq: Once | RECTAL | Status: AC
  Administered 2020-10-15: 1 via RECTAL
  Filled 2020-10-15: qty 1

## 2020-10-15 MED ORDER — POLYETHYLENE GLYCOL 3350 17 G PO PACK
17.0000 g | PACK | Freq: Every day | ORAL | Status: DC
  Administered 2020-10-15: 17 g via ORAL
  Filled 2020-10-15: qty 1

## 2020-10-15 MED ORDER — MAGNESIUM OXIDE 400 MG PO TABS: 400.0000 mg | ORAL_TABLET | Freq: Every day | ORAL | 6 refills | Status: DC

## 2020-10-15 MED ORDER — DOFETILIDE 125 MCG PO CAPS: 125.0000 ug | ORAL_CAPSULE | Freq: Two times a day (BID) | ORAL | 6 refills | Status: DC

## 2020-10-15 MED ORDER — POTASSIUM CHLORIDE CRYS ER 20 MEQ PO TBCR: 60.0000 meq | EXTENDED_RELEASE_TABLET | Freq: Every day | ORAL | 6 refills | Status: DC

## 2020-10-15 NOTE — Discharge Instructions (Signed)
***  PLEASE REMEMBER TO BRING ALL OF YOUR MEDICATIONS TO EACH OF YOUR FOLLOW-UP OFFICE VISITS.  

## 2020-10-15 NOTE — Progress Notes (Addendum)
Progress Note  Patient Name: Maria Fry Date of Encounter: 10/15/2020  Primary Cardiologist: Linna Hoff office - formerly Court Joy, MD   Subjective   [facility] some last night and noted DOE, which is stable. No chest pain.  Hasn't had bowel mvmt since last Monday - feels bloated and would like enema (other measures have not produced BM).  Maintaining sinus rhythm.  Inpatient Medications    Scheduled Meds: . apixaban  5 mg Oral BID  . atorvastatin  80 mg Oral QHS  . calcium carbonate  1 tablet Oral BID WC  . dofetilide  125 mcg Oral BID  . ferrous sulfate  325 mg Oral Q breakfast  . furosemide  40 mg Oral Daily  . gabapentin  300 mg Oral BID  . levothyroxine  100 mcg Oral Q0600  . loratadine  10 mg Oral Daily  . metoprolol tartrate  25 mg Oral BID  . pantoprazole  40 mg Oral BID  . potassium chloride  40 mEq Oral Daily  . ranolazine  500 mg Oral BID  . sodium chloride flush  3 mL Intravenous Q12H   Continuous Infusions: . sodium chloride    . magnesium sulfate bolus IVPB 2 g (10/15/20 0826)   PRN Meds: sodium chloride, acetaminophen, ALPRAZolam, magnesium hydroxide, nitroGLYCERIN, sodium chloride flush, zolpidem   Vital Signs    Vitals:   10/14/20 0843 10/14/20 1113 10/14/20 1918 10/15/20 0500  BP: 122/71 133/66 138/70   Pulse: 67 73 72 (!) 55  Resp: 18 18 18 18   Temp: 97.9 F (36.6 C) 98.5 F (36.9 C) 98.7 F (37.1 C) 98 F (36.7 C)  TempSrc: Oral Oral Oral Oral  SpO2:   99% 98%  Weight:    99.1 kg  Height:        Intake/Output Summary (Last 24 hours) at 10/15/2020 0844 Last data filed at 10/14/2020 2209 Gross per 24 hour  Intake 480 ml  Output 300 ml  Net 180 ml   Filed Weights   10/13/20 0428 10/14/20 0526 10/15/20 0500  Weight: 97.9 kg 98.7 kg 99.1 kg    Physical Exam   GEN: Obese, in no acute distress.  HEENT: Grossly normal.  Neck: Supple, obese, difficult to gauge JVP, no carotid bruits, or masses. Cardiac: RRR, 2/6 SEM @ the upper  sternal borders, no rubs, or gallops. No clubbing, cyanosis, trace bilat LE edema.  Radials/DP/PT 2+ and equal bilaterally.  Respiratory:  Respirations regular and unlabored, clear to auscultation bilaterally. GI: Soft, nontender, nondistended, BS + x 4. MS: no deformity or atrophy. Skin: warm and dry, no rash. Neuro:  Strength and sensation are intact. Psych: AAOx3.  Normal affect.  Labs    Chemistry Recent Labs  Lab 10/13/20 0319 10/14/20 0724 10/15/20 0315  NA 137 136 137  K 3.9 3.9 4.4  CL 103 100 100  CO2 28 30 31   GLUCOSE 123* 150* 113*  BUN 10 8 9   CREATININE 1.08* 1.12* 1.27*  CALCIUM 8.4* 8.3* 8.7*  GFRNONAA 56* 54* 46*  ANIONGAP 6 6 6      Lipids  Lab Results  Component Value Date   CHOL 123 01/03/2020   HDL 46 (L) 01/03/2020   LDLCALC 63 01/03/2020   TRIG 68 01/03/2020   CHOLHDL 2.7 01/03/2020    HbA1c  Lab Results  Component Value Date   HGBA1C 5.5 01/03/2020    Radiology    No results found.  Telemetry    Sinus brady - sinus rhythm, 50's  to 34s - Personally Reviewed  ECG    ECG 11/13 @ 22:16 (2 hrs post 6th dose) RSR, 65, inflat ST dep w/ biphasic t changes. QTc 480 by my estimation (computer read 476)  Cardiac Studies   2D Echocardiogram 08/29/2020  1. Left ventricular ejection fraction, by estimation, is 40 to 45%. The  left ventricle has mildly decreased function. The left ventricle  demonstrates global hypokinesis. There is severe left ventricular  hypertrophy. Left ventricular diastolic parameters  are indeterminate.  2. Right ventricular systolic function is normal. The right ventricular  size is normal. There is moderately elevated pulmonary artery systolic  pressure.  3. Left atrial size was severely dilated.  4. Right atrial size was severely dilated.  5. The mitral valve is normal in structure. Mild to moderate mitral valve  regurgitation. No evidence of mitral stenosis.  6. Tricuspid valve regurgitation is moderate.    7. The aortic valve is tricuspid. Aortic valve regurgitation is mild to  moderate.  8. Moderate pulmonary HTN, PASP is 50 mmHg.  9. The inferior vena cava is dilated in size with <50% respiratory  variability, suggesting right atrial pressure of 15 mmHg.  _____________    Patient Profile     69 y.o.femalew/PMHx of CAD (06/07/15 NSTEMI/Cath: Dist LAD 100%, mid LAD 10%. Otw nl cors. Nl EF w/ inferoapical AK), chronic combined systolic and diastolic CHF, HLD, HTN, hypothyroidism, gastritis, and persistent atrial fibrillation admitted for Tikosyn initiation.  Assessment & Plan    1.  Persistent Afib:  Maintaining sinus rhythm.  QTc relatively stable @ 476-480 on ECG 2 hrs after sixth dose last night.  F/u again after this AMs dose (already provided).  Cont  blocker and eliquis.  Will need ongoing oral K & Mg supplementation w/ goal of 4.0/2.0 respectively.  Avoid titration of ranexa.  2.  CAD:  Stable on statin,  blocker, and ranexa.  No ASA in setting of chronic [place] w/ eliquis.  With initiation of tikosyn and ongoing ranexa rx, she will need close outpt f/u of QTc and should not have titration of ranexa.  3.  Chronic HF w/ midrange EF/ICM: EF 40-45% on echo in 08/2020.  Minus 1.4L since admission, but + 230 ml yesterday.  Wt variable throughout admission - 99.2kg on admission, down to 96.5kg 11/10 w/ steady climb since then, now recorded @ 99.1kg this AM (bedscale). I note that during Oct admission, she was diuresed down to 93 kg.  Exam is challenging due to body habitus.  Lungs clear. Trace LE edema.  With the exception of one additional lasix 40mg  dose on the afternoon of 11/12, she has been receiving lasix 40mg  daily.  Prev home dose 40 BID 4 days/wk and 40 daily the other three days.  Creat up slightly. BUN/Bicarb relatively stable.  I think she'd benefit from a dose of IV lasix and resumption of previous lasix dosing - will d/w Dr. Rayann Heman.  Also reasonable to consider addition of  spironolactone given hypokalemia and reduced EF.  Cont  blocker.  No acei/arb/arni 2/2 anaphylaxis w/ lisinopril in the past.    4.  Hypokalemia/Hypomagnesemia: K stable this AM @ 4.4. Mg 1.9 - receiving Mag sulfate.  Will need oral Mag @ discharge.  Consider spiro.  5.  Morbid Obesity: Would benefit from outpt nutritional counseling.  6.  Essential HTN: stable on  blocker therapy.  7.  HL:  LDL 63 in Feb.  Cont statin rx.  8.  Constipation:  No BM in  nearly a week.  Feels bloated.  Received miralax earlier in admission - will resume.  Also receive MOM yesterday.  Will order fleets enema.   Signed, Murray Hodgkins, NP  10/15/2020, 8:44 AM    For questions or updates, please contact   Please consult [email or web address] for contact info under Cardiology/STEMI.    I have seen, examined the patient, and reviewed the above assessment and plan.  Changes to above are made where necessary.  On exam, RRR. Doing "much better" after having BM.  She is ready to go home today.  We will discharge with close follow-up in AF clinic Resume home diuretic regimen.  Co Sign: Thompson Grayer, MD 10/15/2020 10:52 AM

## 2020-10-15 NOTE — Progress Notes (Addendum)
Pharmacy: Dofetilide (Tikosyn) - Follow Up Assessment and Electrolyte Replacement  Pharmacy consulted to assist in monitoring and replacing electrolytes in this 69 y.o. female admitted on 10/10/2020 undergoing dofetilide initiation. First dofetilide dose: 11/9 @ 2000  Labs:    Component Value Date/Time   K 4.4 10/15/2020 0315   MG 1.9 10/15/2020 0315     Plan: Potassium: K >/= 4: No additional supplementation needed  Magnesium: Mg 1.8-2: Give Mg 2 gm IV x1   Addendum: The patient was initially planned to go home 11/14, but the patient may require more diuresis, which could affect electrolytes. Per cardiology NP, the patient will likely stay another night with further diuresis using furosemide or spironolcatone which may require further KCl dose changes. Pharmacy will continue to follow and provide electrolyte replacement as indicated.   Addendum: The plan to discharge the patient has changed and they will go home today 11/14. Per the MD, the patient will follow up in the atrial fibrillation clinic. I recommended to the MD and NP that the patient be sent home on potassium chloride 40 mEq PO in the AM and 20 mEq PO in the PM each day along with magnesium oxide 400 mg PO daily. The patient was discharged before I could recommend the prescription be adjusted from 60 mEq PO daily. I called the patient and their daughter and recommended that they split up the potassium chloride by taking 2 tablets in the morning and 1 in the evening. Both individuals verbalized understanding and recognized that their prescription from [facility] will say 60 mEq daily but that they will take it as mentioned above.  Thank you for allowing pharmacy to participate in this patient's care    Shauna Hugh, PharmD, [place]  PGY-1 Pharmacy Resident 10/15/2020 8:13 AM  Please check [email or web address] for unit-specific pharmacy phone numbers.

## 2020-10-15 NOTE — Plan of Care (Signed)

## 2020-10-15 NOTE — Discharge Summary (Signed)
Discharge Summary    Patient ID: Maria Fry MRN: [id number]; DOB: 05/04/1951  Admit date: 10/10/2020 Discharge date: 10/15/2020  Primary Care Provider: Alycia Rossetti, MD  Primary Cardiologist: Previously Court Joy, MD Linna Hoff) Primary Electrophysiologist:  Thompson Grayer, MD   Discharge Diagnoses    Principal Problem:   Persistent atrial fibrillation [facility])  **S/p tikosyn loading this admission. Discharge dose 123mcg bid. Active Problems:   CAD (coronary artery disease)   Hypertension   Chronic combined systolic and diastolic heart failure (HCC)   Hypokalemia   Stable angina (HCC)   Obesity, Class III, BMI 40-49.9 (morbid obesity) ([place])   Hypothyroidism   Hyperlipidemia   Gastroesophageal reflux disease   Constipation   History of GI bleed  Diagnostic Studies/Procedures    None _____________   History of Present Illness     Maria Fry is a 69 y.o. female with a history of coronary artery disease, heart failure with midrange ejection fraction, persistent atrial fibrillation, hypertension, hyperlipidemia, hypothyroidism, obesity, GERD, and recent hospitalization for heart failure and melena.  She was initially diagnosed with atrial fibrillation September 2021 at which time she reported increased fatigue.  She was placed on Eliquis in the setting of a CHA2DS2-VASc of 5 and subsequently underwent cardioversion October 14.  Unfortunately, she required admission on October 15 secondary to acute volume overload and dark stools.  She was guaiac positive.  Eliquis was temporarily held and EGD revealed gastritis.  Eliquis was subsequently resumed on October 17 and aspirin was discontinued.  Unfortunately, at follow-up on November 1, she was noted to be back in atrial fibrillation with recurrent fatigue and dyspnea.  Decision was made to pursue admission for Tikosyn loading.  Hospital Course     Consultants: None  Patient was admitted November 9, and was placed on Tikosyn  500 mcg twice daily.  She converted to sinus rhythm after the first dose however, she was noted to have significant widening of QTC out to 551 ms.  Potassium was 3.2 and magnesium was 1.9, and both were supplemented.  Tikosyn was held for 24 hours and subsequently resumed at 125 mcg twice daily after QT normalized on the morning of November 11.  At this dose, QTC has remained relatively stable at 476 ms this morning.  We did discuss her case and medication list with pharmacy and have mutually recognized that she is also on Ranexa which may contribute to QTC prolongation and whose dose should not be increased.  Fortunately, despite reduced dose tikosyn, she has maintained sinus rhythm.  She has required ongoing magnesium and potassium supplementation in the setting of oral Lasix therapy.  Her weight has increased some during admission in the setting of Lasix 40 mg daily, and we we will plan to discharge her on her previous regimen of Lasix 40 mg twice daily 4 days a week and daily 3 days a week.    [name] has been ambulating with stable, chronic dyspnea and despite slight weight gain, has not had significant volume excess.  She has complained of constipation which did not initially respond to MiraLAX or milk of magnesia.  She received an enema this morning which was followed by bowel movement and significant improvement in reported malaise.  [name] will be discharged home today in good condition with follow-up arranged on November 19 in [place] clinic, at which time she will require a follow-up basic metabolic panel and magnesium.    Did the patient have an acute coronary syndrome (  MI, NSTEMI, STEMI, etc) this admission?:  No                               Did the patient have a percutaneous coronary intervention (stent / angioplasty)?:  No.   _____________  Discharge Vitals Blood pressure 138/70, pulse (!) 55, temperature 98 F (36.7 C), temperature source Oral, resp. rate 18, height 5' (1.524 m),  weight 99.1 kg, SpO2 98 %.  Filed Weights   10/13/20 0428 10/14/20 0526 10/15/20 0500  Weight: 97.9 kg 98.7 kg 99.1 kg    Labs & Radiologic Studies     Basic Metabolic Panel Recent Labs    10/14/20 0724 10/15/20 0315  NA 136 137  K 3.9 4.4  CL 100 100  CO2 30 31  GLUCOSE 150* 113*  BUN 8 9  CREATININE 1.12* 1.27*  CALCIUM 8.3* 8.7*  MG 2.1 1.9  ____________   Disposition   Pt is being discharged home today in good condition.  Follow-up Plans & Appointments     Follow-up Information    [facility] Follow up.   Specialty: Cardiology Why: 10/20/2020 @ 11:00AM with R. Fenton, PA  (parking code for November is 3008) Sport and exercise psychologist information: [address] [id number] [place] [phone]       Thompson Grayer, MD Follow up.   Specialty: Cardiology Why: 11/15/2020 @ 4:15PM Contact information: [address] [place] [address] [phone]              Discharge Instructions    (HEART FAILURE PATIENTS) Call MD:  Anytime you have any of the following symptoms: 1) 3 pound weight gain in 24 hours or 5 pounds in 1 week 2) shortness of breath, with or without a dry hacking cough 3) swelling in the hands, feet or stomach 4) if you have to sleep on extra pillows at night in order to breathe.   Complete by: As directed    Call MD for:  difficulty breathing, headache or visual disturbances   Complete by: As directed    Diet - low sodium heart healthy   Complete by: As directed    Increase activity slowly   Complete by: As directed       Discharge Medications   Allergies as of 10/15/2020      Reactions   Lisinopril Anaphylaxis   Ranolazine Anaphylaxis   Dizzy, Reaction occurred with generic med   Vicodin [hydrocodone-acetaminophen] Diarrhea, Nausea And Vomiting   Amoxicillin Swelling   Demerol [meperidine] Nausea And Vomiting   Lodine [etodolac] Rash      Medication List    TAKE  these medications   acetaminophen 500 MG tablet Commonly known as: TYLENOL Take 500 mg by mouth every 6 (six) hours as needed for mild pain or moderate pain. Reported on 04/22/2016   apixaban 5 MG Tabs tablet Commonly known as: Eliquis Take 1 tablet (5 mg total) by mouth 2 (two) times daily.   atorvastatin 80 MG tablet Commonly known as: LIPITOR Take 1 tablet (80 mg total) by mouth at bedtime.   calcium carbonate 600 MG Tabs tablet Commonly known as: Calcium 600 Take 1 tablet (600 mg total) by mouth 2 (two) times daily with a meal.   dofetilide 125 MCG capsule Commonly known as: TIKOSYN Take 1 capsule (125 mcg total) by mouth 2 (two) times daily.   ferrous sulfate 325 (65 FE) MG EC  tablet Take 325 mg by mouth daily.   furosemide 40 MG tablet Commonly known as: Lasix Take 40 mg twice a day 4 days/week (Tuesday, Thursday, Saturday, Sunday) and take 40 mg  daily 3 days/week (Monday, Wednesday and Friday)   gabapentin 300 MG capsule Commonly known as: NEURONTIN Take 300 mg by mouth 2 (two) times daily.   levothyroxine 100 MCG tablet Commonly known as: SYNTHROID TAKE 1 TABLET BY MOUTH ONCE DAILY BEFORE BREAKFAST What changed: See the new instructions.   loratadine 10 MG tablet Commonly known as: CLARITIN Take 10 mg by mouth daily.   magnesium oxide 400 MG tablet Commonly known as: MAG-OX Take 1 tablet (400 mg total) by mouth daily.   metoprolol tartrate 25 MG tablet Commonly known as: LOPRESSOR Take 1 tablet (25 mg total) by mouth 2 (two) times daily.   nitroGLYCERIN 0.4 MG SL tablet Commonly known as: NITROSTAT DISSOLVE ONE TABLET UNDER THE TONGUE EVERY 5 MINUTES AS NEEDED FOR CHEST PAIN.  DO NOT EXCEED A TOTAL OF 3 DOSES IN 15 MINUTES What changed:   how much to take  how to take this  when to take this  reasons to take this   pantoprazole 40 MG tablet Commonly known as: PROTONIX Take 1 tablet (40 mg total) by mouth 2 (two) times daily.   potassium  chloride SA 20 MEQ tablet Commonly known as: KLOR-CON Take 3 tablets (60 mEq total) by mouth daily. What changed: how much to take   ranolazine 500 MG 12 hr tablet Commonly known as: Ranexa Take 1 tablet (500 mg total) by mouth 2 (two) times daily.   Vitamin D 50 MCG (2000 UT) Caps Take 1 tablet daily What changed:   how much to take  how to take this  when to take this  additional instructions        Outstanding Labs/Studies   F/u BMET/Mg at office f/u 10/20/2020  Duration of Discharge Encounter   Greater than 30 minutes including physician time.  Signed, Murray Hodgkins, NP 10/15/2020, 12:27 PM

## 2020-10-15 NOTE — Plan of Care (Signed)
Problem: Education: Goal: Knowledge of General Education information will improve Description: Including pain rating scale, medication(s)/side effects and non-pharmacologic comfort measures 10/15/2020 1223 by Camillia Herter, RN Outcome: Adequate for Discharge 10/15/2020 0728 by Camillia Herter, RN Outcome: Progressing   Problem: Health Behavior/Discharge Planning: Goal: Ability to manage health-related needs will improve 10/15/2020 1223 by Camillia Herter, RN Outcome: Adequate for Discharge 10/15/2020 0728 by Camillia Herter, RN Outcome: Progressing   Problem: Clinical Measurements: Goal: Ability to maintain clinical measurements within normal limits will improve 10/15/2020 1223 by Camillia Herter, RN Outcome: Adequate for Discharge 10/15/2020 0728 by Camillia Herter, RN Outcome: Progressing Goal: Will remain free from infection 10/15/2020 1223 by Camillia Herter, RN Outcome: Adequate for Discharge 10/15/2020 0728 by Camillia Herter, RN Outcome: Progressing Goal: Diagnostic test results will improve 10/15/2020 1223 by Camillia Herter, RN Outcome: Adequate for Discharge 10/15/2020 0728 by Camillia Herter, RN Outcome: Progressing Goal: Respiratory complications will improve 10/15/2020 1223 by Camillia Herter, RN Outcome: Adequate for Discharge 10/15/2020 0728 by Camillia Herter, RN Outcome: Progressing Goal: Cardiovascular complication will be avoided 10/15/2020 1223 by Camillia Herter, RN Outcome: Adequate for Discharge 10/15/2020 0728 by Camillia Herter, RN Outcome: Progressing   Problem: Activity: Goal: Risk for activity intolerance will decrease 10/15/2020 1223 by Camillia Herter, RN Outcome: Adequate for Discharge 10/15/2020 0728 by Camillia Herter, RN Outcome: Progressing   Problem: Nutrition: Goal: Adequate nutrition will be maintained 10/15/2020 1223 by Camillia Herter, RN Outcome: Adequate for Discharge 10/15/2020 0728 by Camillia Herter, RN Outcome: Progressing    Problem: Coping: Goal: Level of anxiety will decrease 10/15/2020 1223 by Camillia Herter, RN Outcome: Adequate for Discharge 10/15/2020 0728 by Camillia Herter, RN Outcome: Progressing   Problem: Elimination: Goal: Will not experience complications related to bowel motility 10/15/2020 1223 by Camillia Herter, RN Outcome: Adequate for Discharge 10/15/2020 0728 by Camillia Herter, RN Outcome: Progressing Goal: Will not experience complications related to urinary retention 10/15/2020 1223 by Camillia Herter, RN Outcome: Adequate for Discharge 10/15/2020 0728 by Camillia Herter, RN Outcome: Progressing   Problem: Pain Managment: Goal: General experience of comfort will improve 10/15/2020 1223 by Camillia Herter, RN Outcome: Adequate for Discharge 10/15/2020 0728 by Camillia Herter, RN Outcome: Progressing   Problem: Safety: Goal: Ability to remain free from injury will improve 10/15/2020 1223 by Camillia Herter, RN Outcome: Adequate for Discharge 10/15/2020 0728 by Camillia Herter, RN Outcome: Progressing   Problem: Skin Integrity: Goal: Risk for impaired skin integrity will decrease 10/15/2020 1223 by Camillia Herter, RN Outcome: Adequate for Discharge 10/15/2020 0728 by Camillia Herter, RN Outcome: Progressing   Problem: Education: Goal: Knowledge of disease or condition will improve 10/15/2020 1223 by Camillia Herter, RN Outcome: Adequate for Discharge 10/15/2020 0728 by Camillia Herter, RN Outcome: Progressing Goal: Understanding of medication regimen will improve 10/15/2020 1223 by Camillia Herter, RN Outcome: Adequate for Discharge 10/15/2020 0728 by Camillia Herter, RN Outcome: Progressing Goal: Individualized Educational Video(s) 10/15/2020 1223 by Camillia Herter, RN Outcome: Adequate for Discharge 10/15/2020 0728 by Camillia Herter, RN Outcome: Progressing   Problem: Activity: Goal: Ability to tolerate increased activity will improve 10/15/2020 1223 by Camillia Herter,  RN Outcome: Adequate for Discharge 10/15/2020 0728 by Camillia Herter, RN Outcome: Progressing   Problem: Cardiac: Goal: Ability to achieve and maintain adequate cardiopulmonary perfusion will improve 10/15/2020 1223 by  Camillia Herter, RN Outcome: Adequate for Discharge 10/15/2020 0488 by Camillia Herter, RN Outcome: Progressing   Problem: Health Behavior/Discharge Planning: Goal: Ability to safely manage health-related needs after discharge will improve 10/15/2020 1223 by Camillia Herter, RN Outcome: Adequate for Discharge 10/15/2020 0728 by Camillia Herter, RN Outcome: Progressing

## 2020-10-20 ENCOUNTER — Ambulatory Visit (HOSPITAL_COMMUNITY)
Admit: 2020-10-20 | Discharge: 2020-10-20 | Disposition: A | Discharge status: Home / Self Care | Payer: PPO | Attending: Physician Assistant | Admitting: Physician Assistant

## 2020-10-20 ENCOUNTER — Other Ambulatory Visit: Payer: Self-pay

## 2020-10-20 ENCOUNTER — Encounter (HOSPITAL_COMMUNITY): Payer: Self-pay | Admitting: Physician Assistant

## 2020-10-20 VITALS — BP 152/94 | HR 67 | Ht 60.0 in | Wt 211.0 lb

## 2020-10-20 DIAGNOSIS — Z7182 Exercise counseling: Secondary | ICD-10-CM | POA: Diagnosis not present

## 2020-10-20 DIAGNOSIS — Z8719 Personal history of other diseases of the digestive system: Secondary | ICD-10-CM | POA: Insufficient documentation

## 2020-10-20 DIAGNOSIS — I2510 Atherosclerotic heart disease of native coronary artery without angina pectoris: Secondary | ICD-10-CM | POA: Diagnosis not present

## 2020-10-20 DIAGNOSIS — G4733 Obstructive sleep apnea (adult) (pediatric): Secondary | ICD-10-CM | POA: Insufficient documentation

## 2020-10-20 DIAGNOSIS — Z79899 Other long term (current) drug therapy: Secondary | ICD-10-CM | POA: Insufficient documentation

## 2020-10-20 DIAGNOSIS — I5042 Chronic combined systolic (congestive) and diastolic (congestive) heart failure: Secondary | ICD-10-CM | POA: Diagnosis not present

## 2020-10-20 DIAGNOSIS — I4819 Other persistent atrial fibrillation: Secondary | ICD-10-CM | POA: Insufficient documentation

## 2020-10-20 DIAGNOSIS — Z79890 Hormone replacement therapy (postmenopausal): Secondary | ICD-10-CM | POA: Diagnosis not present

## 2020-10-20 DIAGNOSIS — I252 Old myocardial infarction: Secondary | ICD-10-CM | POA: Insufficient documentation

## 2020-10-20 DIAGNOSIS — Z6841 Body Mass Index (BMI) 40.0 and over, adult: Secondary | ICD-10-CM | POA: Diagnosis not present

## 2020-10-20 DIAGNOSIS — E039 Hypothyroidism, unspecified: Secondary | ICD-10-CM | POA: Diagnosis not present

## 2020-10-20 DIAGNOSIS — E785 Hyperlipidemia, unspecified: Secondary | ICD-10-CM | POA: Diagnosis not present

## 2020-10-20 DIAGNOSIS — I10 Essential (primary) hypertension: Secondary | ICD-10-CM | POA: Insufficient documentation

## 2020-10-20 DIAGNOSIS — Z9049 Acquired absence of other specified parts of digestive tract: Secondary | ICD-10-CM | POA: Diagnosis not present

## 2020-10-20 DIAGNOSIS — D6869 Other thrombophilia: Secondary | ICD-10-CM

## 2020-10-20 DIAGNOSIS — Z87891 Personal history of nicotine dependence: Secondary | ICD-10-CM | POA: Insufficient documentation

## 2020-10-20 DIAGNOSIS — E669 Obesity, unspecified: Secondary | ICD-10-CM | POA: Insufficient documentation

## 2020-10-20 DIAGNOSIS — Z7901 Long term (current) use of anticoagulants: Secondary | ICD-10-CM | POA: Diagnosis not present

## 2020-10-20 LAB — BASIC METABOLIC PANEL
Anion gap: 4 — ABNORMAL LOW (ref 5–15)
BUN: 15 mg/dL (ref 8–23)
CO2: 26 mmol/L (ref 22–32)
Calcium: 8.7 mg/dL — ABNORMAL LOW (ref 8.9–10.3)
Chloride: 107 mmol/L (ref 98–111)
Creatinine, Ser: 0.97 mg/dL (ref 0.44–1.00)
GFR, Estimated: >60 mL/min (ref >60)
Glucose, Bld: 98 mg/dL (ref 70–99)
Potassium: 5.3 mmol/L — ABNORMAL HIGH (ref 3.5–5.1)
Sodium: 137 mmol/L (ref 135–145)

## 2020-10-20 LAB — MAGNESIUM: Magnesium: 2.6 mg/dL — ABNORMAL HIGH (ref 1.7–2.4)

## 2020-10-20 MED ORDER — POTASSIUM CHLORIDE CRYS ER 20 MEQ PO TBCR
60.0000 meq | EXTENDED_RELEASE_TABLET | Freq: Every day | ORAL | 6 refills | Status: DC
Start: 2020-10-20 — End: 2020-10-24

## 2020-10-20 NOTE — Progress Notes (Signed)
Primary Care Physician: Alycia Rossetti, MD Primary Cardiologist: Dr Bronson Ing (former) Primary Electrophysiologist: Dr Rayann Heman (new) Referring Physician: Otelia Sergeant is a 69 y.o. female with a history of CAD, chronic combined systolic and diastolic CHF, HLD, HTN, hypothyroidism, and persistent atrial fibrillation who presents for follow up in the [facility]. The patient was initially diagnosed with atrial fibrillation 08/18/20 at a routine follow up with Rosaria Ferries. She was having symptoms of increased fatigue. She was started on Eliquis for a CHADS2VASC score of 5. She underwent DCCV on 09/14/20. She was hospitalized 10/15-10/17/21 for acute CHF and black stools. Workup revealed gastritis and her Eliquis was temporarily held. She resumed 09/17/20. Her ASA was discontinued and she has not had recurrence of bleeding. She was back in afib on follow up 10/02/20. She reports that she did feel better in SR with more energy and less SOB.  On follow up today, patient is s/p dofetilide loading 11/9-11/14/21. She converted to sinus rhythm after the first dose however, she was noted to have significant widening of QTC out to 551 ms. Dofetilide was held for 24 hours and subsequently resumed at 125 mcg twice daily after QT normalized. Patient reports that she feels very well today. Her fatigue and SOB are greatly improved. She denies any issues with dofetilide.   Today, she denies symptoms of palpitations, SOB, chest pain, orthopnea, PND, dizziness, presyncope, syncope, bleeding, or neurologic sequela. The patient is tolerating medications without difficulties and is otherwise without complaint today.    Atrial Fibrillation Risk Factors:  she does have symptoms or diagnosis of sleep apnea. Sleep consult with Dr Halford Chessman pending. she does not have a history of rheumatic fever.   she has a BMI of Body mass index is 41.21 kg/m.Marland Kitchen Filed Weights   10/20/20 1111   Weight: 95.7 kg    Family History  Problem Relation Age of Onset  . Heart failure Father        Deceased  . Heart attack Father        Deceased  . Stroke Sister        Deceased  . Heart failure Brother        Deceased  . Cancer Brother        Deceased  . Heart attack Brother        Deceased  . Colon cancer Neg Hx      Atrial Fibrillation Management history:  Previous antiarrhythmic drugs: dofetilide  Previous cardioversions: 09/14/20 Previous ablations: none CHADS2VASC score: 5 Anticoagulation history: Eliquis   Past Medical History:  Diagnosis Date  . Atrial fibrillation ([place])   . CAD (coronary artery disease)    a. 06/07/15 NSTEMI/Cath: Dist LAD 100%, mid LAD 10%. Otw nl cors. Nl EF w/ inferoapical AK.  Marland Kitchen Chronic combined systolic (congestive) and diastolic (congestive) heart failure ([place]) 04/23/2016   a. 04/2016 Echo: EF 45%, Gr2 DD; b. 11/2019 Echo: EF 45-50%, mod LVH. Gr2 DD. Nl RV fxn. Sev dil LA.   Marland Kitchen Complication of anesthesia   . Essential hypertension   . Hip pain   . Hyperlipidemia   . Hypothyroidism   . MI (myocardial infarction) ([place]) 06/05/2015  . PONV (postoperative nausea and vomiting)   . Vitamin D deficiency    Past Surgical History:  Procedure Laterality Date  . ABDOMINAL HYSTERECTOMY    . BIOPSY  09/17/2020   Procedure: BIOPSY;  Surgeon: [name], DO;  [facility];  Service: Endoscopy;;  . CARDIAC CATHETERIZATION N/A 06/06/2015   Procedure: Left Heart Cath and Coronary Angiography;  Surgeon: Peter M Martinique, MD;  Location: [facility];  Service: Cardiovascular;  Laterality: N/A;  . CARDIOVERSION N/A 09/14/2020   Procedure: CARDIOVERSION;  Surgeon: Satira Sark, MD;  [facility];  Service: Cardiovascular;  Laterality: N/A;  . CHOLECYSTECTOMY    . COLONOSCOPY    . COLONOSCOPY N/A 08/13/2017   Procedure: COLONOSCOPY;  Surgeon: Daneil Dolin, MD;  [facility];  Service: Endoscopy;   Laterality: N/A;  8:30 AM  . ESOPHAGOGASTRODUODENOSCOPY N/A 09/11/2016   Procedure: ESOPHAGOGASTRODUODENOSCOPY (EGD);  Surgeon: Daneil Dolin, MD;  [facility];  Service: Endoscopy;  Laterality: N/A;  7:45 am - moved to 10/11 @ 10:30  . ESOPHAGOGASTRODUODENOSCOPY (EGD) WITH PROPOFOL N/A 09/17/2020   Procedure: ESOPHAGOGASTRODUODENOSCOPY (EGD) WITH PROPOFOL;  Surgeon: [name], DO;  [facility];  Service: Endoscopy;  Laterality: N/A;  . Heel spurs    . MALONEY DILATION N/A 09/11/2016   Procedure: Venia Minks DILATION;  Surgeon: Daneil Dolin, MD;  [facility];  Service: Endoscopy;  Laterality: N/A;  . POLYPECTOMY  08/13/2017   Procedure: POLYPECTOMY;  Surgeon: Daneil Dolin, MD;  [facility];  Service: Endoscopy;;  colon  . SHOULDER ARTHROSCOPY    . TUBAL LIGATION      Current Outpatient Medications  Medication Sig Dispense Refill  . acetaminophen (TYLENOL) 500 MG tablet Take 500 mg by mouth every 6 (six) hours as needed for mild pain or moderate pain. Reported on 04/22/2016    . apixaban (ELIQUIS) 5 MG TABS tablet Take 1 tablet (5 mg total) by mouth 2 (two) times daily. 60 tablet 11  . atorvastatin (LIPITOR) 80 MG tablet Take 1 tablet (80 mg total) by mouth at bedtime. 90 tablet 3  . calcium carbonate (CALCIUM 600) 600 MG TABS tablet Take 1 tablet (600 mg total) by mouth 2 (two) times daily with a meal. 60 tablet 6  . Cholecalciferol (VITAMIN D) 50 MCG (2000 UT) CAPS Take 1 tablet daily (Patient taking differently: Take 2,000 Units by mouth daily. ) 30 capsule 2  . dofetilide (TIKOSYN) 125 MCG capsule Take 1 capsule (125 mcg total) by mouth 2 (two) times daily. 60 capsule 6  . ferrous sulfate 325 (65 FE) MG EC tablet Take 325 mg by mouth daily.    . furosemide (LASIX) 40 MG tablet Take 40 mg twice a day 4 days/week (Tuesday, Thursday, Saturday, Sunday) and take 40 mg  daily 3 days/week (Monday, Wednesday and Friday) 120 tablet 6  .  gabapentin (NEURONTIN) 300 MG capsule Take 300 mg by mouth 2 (two) times daily.     Marland Kitchen levothyroxine (SYNTHROID) 100 MCG tablet TAKE 1 TABLET BY MOUTH ONCE DAILY BEFORE BREAKFAST (Patient taking differently: Take 100 mcg by mouth daily before breakfast. ) 90 tablet 2  . loratadine (CLARITIN) 10 MG tablet Take 10 mg by mouth daily.     . magnesium oxide (MAG-OX) 400 MG tablet Take 1 tablet (400 mg total) by mouth daily. 30 tablet 6  . metoprolol tartrate (LOPRESSOR) 25 MG tablet Take 1 tablet (25 mg total) by mouth 2 (two) times daily. 180 tablet 3  . nitroGLYCERIN (NITROSTAT) 0.4 MG SL tablet DISSOLVE ONE TABLET UNDER THE TONGUE EVERY 5 MINUTES AS NEEDED FOR CHEST PAIN.  DO NOT EXCEED A TOTAL OF 3 DOSES IN 15 MINUTES (Patient taking differently: Place  0.4 mg under the tongue every 5 (five) minutes as needed for chest pain. DISSOLVE ONE TABLET UNDER THE TONGUE EVERY 5 MINUTES AS NEEDED FOR CHEST PAIN.  DO NOT EXCEED A TOTAL OF 3 DOSES IN 15 MINUTES) 25 tablet 3  . pantoprazole (PROTONIX) 40 MG tablet Take 1 tablet (40 mg total) by mouth 2 (two) times daily. 60 tablet 1  . potassium chloride SA (KLOR-CON) 20 MEQ tablet Take 3 tablets (60 mEq total) by mouth daily. 90 tablet 6  . ranolazine (RANEXA) 500 MG 12 hr tablet Take 1 tablet (500 mg total) by mouth 2 (two) times daily. 60 tablet 2   No current facility-administered medications for this encounter.    Allergies  Allergen Reactions  . Lisinopril Anaphylaxis  . Ranolazine Anaphylaxis    Dizzy, Reaction occurred with generic med  . Vicodin [Hydrocodone-Acetaminophen] Diarrhea and Nausea And Vomiting  . Amoxicillin Swelling  . Demerol [Meperidine] Nausea And Vomiting  . Lodine [Etodolac] Rash    Social History   Socioeconomic History  . Marital status: Single    Spouse name: Not on file  . Number of children: Not on file  . Years of education: Not on file  . Highest education level: Not on file  Occupational History  . Not on file   Tobacco Use  . Smoking status: Former Smoker    Types: Cigarettes    Start date: 12/02/1977    Quit date: 12/02/2002    Years since quitting: 17.8  . Smokeless tobacco: Never Used  Vaping Use  . Vaping Use: Never used  Substance and Sexual Activity  . Alcohol use: No    Alcohol/week: 0.0 standard drinks  . Drug use: No  . Sexual activity: Yes  Other Topics Concern  . Not on file  Social History Narrative  . Not on file   Social Determinants of Health   Financial Resource Strain: Low Risk   . Difficulty of Paying Living Expenses: Not very hard  Food Insecurity:   . Worried About Charity fundraiser in the Last Year: Not on file  . Ran Out of Food in the Last Year: Not on file  Transportation Needs:   . Lack of Transportation (Medical): Not on file  . Lack of Transportation (Non-Medical): Not on file  Physical Activity:   . Days of Exercise per Week: Not on file  . Minutes of Exercise per Session: Not on file  Stress:   . Feeling of Stress : Not on file  Social Connections:   . Frequency of Communication with Friends and Family: Not on file  . Frequency of Social Gatherings with Friends and Family: Not on file  . Attends Religious Services: Not on file  . Active Member of Clubs or Organizations: Not on file  . Attends Archivist Meetings: Not on file  . Marital Status: Not on file  Intimate Partner Violence:   . Fear of Current or Ex-Partner: Not on file  . Emotionally Abused: Not on file  . Physically Abused: Not on file  . Sexually Abused: Not on file     ROS- All systems are reviewed and negative except as per the HPI above.  Physical Exam: Vitals:   10/20/20 1111  BP: (!) 164/102  Pulse: 67  Weight: 95.7 kg  Height: 5' (1.524 m)    GEN- The patient is well appearing obese female, alert and oriented x 3 today.   HEENT-head normocephalic, atraumatic, sclera clear, conjunctiva pink, hearing intact,  trachea midline. Lungs- Clear to ausculation  bilaterally, normal work of breathing Heart- Regular rate and rhythm, no murmurs, rubs or gallops  GI- soft, NT, ND, + BS Extremities- no clubbing, cyanosis, or edema MS- no significant deformity or atrophy Skin- no rash or lesion Psych- euthymic mood, full affect Neuro- strength and sensation are intact   Wt Readings from Last 3 Encounters:  10/20/20 95.7 kg  10/15/20 99.1 kg  10/10/20 99.4 kg    EKG today demonstrates SR HR 67, NST, PR 182, QRS 102, QTc 462  Echo 08/29/20 demonstrated  1. Left ventricular ejection fraction, by estimation, is 40 to 45%. The  left ventricle has mildly decreased function. The left ventricle  demonstrates global hypokinesis. There is severe left ventricular  hypertrophy. Left ventricular diastolic parameters  are indeterminate.  2. Right ventricular systolic function is normal. The right ventricular  size is normal. There is moderately elevated pulmonary artery systolic  pressure.  3. Left atrial size was severely dilated.  4. Right atrial size was severely dilated.  5. The mitral valve is normal in structure. Mild to moderate mitral valve  regurgitation. No evidence of mitral stenosis.  6. Tricuspid valve regurgitation is moderate.  7. The aortic valve is tricuspid. Aortic valve regurgitation is mild to  moderate.  8. Moderate pulmonary HTN, PASP is 50 mmHg.  9. The inferior vena cava is dilated in size with <50% respiratory  variability, suggesting right atrial pressure of 15 mmHg.   Epic records are reviewed at length today  CHA2DS2-VASc Score = 5  The patient's score is based upon: CHF History: 1 HTN History: 1 Diabetes History: 0 Stroke History: 0 Vascular Disease History: 1 Age Score: 1 Gender Score: 1      ASSESSMENT AND PLAN: 1. Persistent Atrial Fibrillation (ICD10:  I48.19) The patient's CHA2DS2-VASc score is 5, indicating a 7.2% annual risk of stroke.   S/p dofetilide loading 11/9-11/14/21 Patient appears to be  maintaining SR. Continue dofetilide 125 mcg BID. QT stable. Check bmet/mag today. Continue Eliquis 5 mg BID Continue Lopressor 25 mg BID  2. Secondary Hypercoagulable State (ICD10:  D68.69) The patient is at significant risk for stroke/thromboembolism based upon her CHA2DS2-VASc Score of 5.  Continue Apixaban (Eliquis).   3. Obesity Body mass index is 41.21 kg/m. Lifestyle modification was discussed and encouraged including regular physical activity and weight reduction.  4. Suspected obstructive sleep apnea Sleep consult pending.  5. Chronic combined systolic and diastolic CHF No signs or symptoms of fluid overload today.  6. CAD No anginal symptoms.  Should not have up titration of Ranexa 2/2 QT.  7. HTN Stable, no changes today.   Follow up with Dr Rayann Heman as scheduled.    [facility] [address] [place], [place] [address] [phone] 10/20/2020 11:39 AM

## 2020-10-24 ENCOUNTER — Other Ambulatory Visit (HOSPITAL_COMMUNITY): Payer: Self-pay | Admitting: *Deleted

## 2020-10-24 MED ORDER — POTASSIUM CHLORIDE CRYS ER 20 MEQ PO TBCR
40.0000 meq | EXTENDED_RELEASE_TABLET | Freq: Every day | ORAL | 6 refills | Status: DC
Start: 2020-10-24 — End: 2021-03-27

## 2020-10-24 MED ORDER — MAGNESIUM OXIDE 400 MG PO TABS
200.0000 mg | ORAL_TABLET | Freq: Every day | ORAL | 6 refills | Status: DC
Start: 2020-10-24 — End: 2024-01-26

## 2020-10-31 ENCOUNTER — Ambulatory Visit: Payer: PPO | Admitting: Gastroenterology

## 2020-10-31 ENCOUNTER — Other Ambulatory Visit: Payer: Self-pay

## 2020-10-31 ENCOUNTER — Encounter: Payer: Self-pay | Admitting: Gastroenterology

## 2020-10-31 VITALS — BP 132/78 | HR 64 | Temp 96.3°F | Ht 60.0 in | Wt 210.6 lb

## 2020-10-31 DIAGNOSIS — Z86010 Personal history of colonic polyps: Secondary | ICD-10-CM | POA: Insufficient documentation

## 2020-10-31 DIAGNOSIS — B9681 Helicobacter pylori [H. pylori] as the cause of diseases classified elsewhere: Secondary | ICD-10-CM | POA: Diagnosis not present

## 2020-10-31 DIAGNOSIS — D649 Anemia, unspecified: Secondary | ICD-10-CM

## 2020-10-31 DIAGNOSIS — K2970 Gastritis, unspecified, without bleeding: Secondary | ICD-10-CM | POA: Diagnosis not present

## 2020-10-31 NOTE — Patient Instructions (Signed)
1. Please have your labs done. 2. We will call to get you scheduled for a colonoscopy as soon as you are cleared by cardiology.

## 2020-10-31 NOTE — Progress Notes (Signed)
Primary Care Physician: Alycia Rossetti, MD  Primary Gastroenterologist:  Garfield Cornea, MD   Chief Complaint  Patient presents with  . Hospitalization Follow-up    stools are black; also taking Iron    HPI: Maria Fry is a 69 y.o. female here for hospital follow-up.  She was hospitalized back in October 16th with melena.  Chronically on Eliquis for A. fib.  Recently underwent cardioversion on October 14.  In the ED hemoglobin was stable at 10.3.  She is heme positive.  History of taking Goody's powders in the past but stopped once she started Eliquis several months back.  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  While in the hospital she did have an upper endoscopy that showed H. pylori gastritis.  It appears that she may have not been treated yet because the initial prescription recommended she cannot have due to penicillin allergy.  We will follow-up on this.  She was readmitted November 9 through November 14 for persistent A. fib status post Tikosyn loading.  From a GI standpoint patient states that her stools turned tan for about 1 week after she was released from the hospital back in October.  Since then her stools have been black again.  She remains on Eliquis.  Denies any red blood in the stools.  No abdominal pain.  No nausea or vomiting.  Bowel movements are regular.  She denies any significant fatigue or shortness of breath.  No chest pain.  No lightheadedness.  Her hemoglobin has not been checked since October 17 at time of discharge.        Current Outpatient Medications  Medication Sig Dispense Refill  . acetaminophen (TYLENOL) 500 MG tablet Take 500 mg by mouth every 6 (six) hours as needed for mild pain or moderate pain. Reported on 04/22/2016    . apixaban (ELIQUIS) 5 MG TABS tablet Take 1 tablet (5 mg total) by mouth 2 (two) times daily. 60 tablet 11  . atorvastatin  (LIPITOR) 80 MG tablet Take 1 tablet (80 mg total) by mouth at bedtime. 90 tablet 3  . calcium carbonate (CALCIUM 600) 600 MG TABS tablet Take 1 tablet (600 mg total) by mouth 2 (two) times daily with a meal. 60 tablet 6  . Cholecalciferol (VITAMIN D) 50 MCG (2000 UT) CAPS Take 1 tablet daily (Patient taking differently: Take 2,000 Units by mouth daily. ) 30 capsule 2  . dofetilide (TIKOSYN) 125 MCG capsule Take 1 capsule (125 mcg total) by mouth 2 (two) times daily. 60 capsule 6  . ferrous sulfate 325 (65 FE) MG EC tablet Take 325 mg by mouth daily.    . furosemide (LASIX) 40 MG tablet Take 40 mg twice a day 4 days/week (Tuesday, Thursday, Saturday, Sunday) and take 40 mg  daily 3 days/week (Monday, Wednesday and Friday) 120 tablet 6  . gabapentin (NEURONTIN) 300 MG capsule Take 300 mg by mouth 2 (two) times daily.     Marland Kitchen levothyroxine (SYNTHROID) 100 MCG tablet TAKE 1 TABLET BY MOUTH ONCE DAILY BEFORE BREAKFAST (Patient taking differently: Take 100 mcg by mouth daily before breakfast. ) 90 tablet 2  . loratadine (CLARITIN) 10 MG tablet Take 10 mg by mouth daily.     . magnesium oxide (MAG-OX) 400 MG tablet Take 0.5 tablets (200 mg total) by mouth daily. 30 tablet 6  . metoprolol tartrate (LOPRESSOR) 25 MG tablet Take  1 tablet (25 mg total) by mouth 2 (two) times daily. 180 tablet 3  . nitroGLYCERIN (NITROSTAT) 0.4 MG SL tablet DISSOLVE ONE TABLET UNDER THE TONGUE EVERY 5 MINUTES AS NEEDED FOR CHEST PAIN.  DO NOT EXCEED A TOTAL OF 3 DOSES IN 15 MINUTES (Patient taking differently: Place 0.4 mg under the tongue every 5 (five) minutes as needed for chest pain. DISSOLVE ONE TABLET UNDER THE TONGUE EVERY 5 MINUTES AS NEEDED FOR CHEST PAIN.  DO NOT EXCEED A TOTAL OF 3 DOSES IN 15 MINUTES) 25 tablet 3  . pantoprazole (PROTONIX) 40 MG tablet Take 1 tablet (40 mg total) by mouth 2 (two) times daily. 60 tablet 1  . potassium chloride SA (KLOR-CON) 20 MEQ tablet Take 2 tablets (40 mEq total) by mouth daily. 90  tablet 6  . ranolazine (RANEXA) 500 MG 12 hr tablet Take 1 tablet (500 mg total) by mouth 2 (two) times daily. 60 tablet 2   No current facility-administered medications for this visit.    Allergies as of 10/31/2020 - Review Complete 10/31/2020  Allergen Reaction Noted  . Lisinopril Anaphylaxis 06/13/2015  . Ranolazine Anaphylaxis 09/04/2020  . Vicodin [hydrocodone-acetaminophen] Diarrhea and Nausea And Vomiting 04/25/2016  . Amoxicillin Swelling 06/01/2018  . Demerol [meperidine] Nausea And Vomiting 01/17/2013  . Lodine [etodolac] Rash 01/17/2013   Past Medical History:  Diagnosis Date  . Atrial fibrillation ([place])   . CAD (coronary artery disease)    a. 06/07/15 NSTEMI/Cath: Dist LAD 100%, mid LAD 10%. Otw nl cors. Nl EF w/ inferoapical AK.  Marland Kitchen Chronic combined systolic (congestive) and diastolic (congestive) heart failure ([place]) 04/23/2016   a. 04/2016 Echo: EF 45%, Gr2 DD; b. 11/2019 Echo: EF 45-50%, mod LVH. Gr2 DD. Nl RV fxn. Sev dil LA.   Marland Kitchen Complication of anesthesia   . Essential hypertension   . Hip pain   . Hyperlipidemia   . Hypothyroidism   . MI (myocardial infarction) ([place]) 06/05/2015  . PONV (postoperative nausea and vomiting)   . Vitamin D deficiency    Past Surgical History:  Procedure Laterality Date  . ABDOMINAL HYSTERECTOMY    . BIOPSY  09/17/2020   Procedure: BIOPSY;  Surgeon: [name], DO;  [facility];  Service: Endoscopy;;  . CARDIAC CATHETERIZATION N/A 06/06/2015   Procedure: Left Heart Cath and Coronary Angiography;  Surgeon: Peter M Martinique, MD;  Location: [facility];  Service: Cardiovascular;  Laterality: N/A;  . CARDIOVERSION N/A 09/14/2020   Procedure: CARDIOVERSION;  Surgeon: Satira Sark, MD;  [facility];  Service: Cardiovascular;  Laterality: N/A;  . CHOLECYSTECTOMY    . COLONOSCOPY    . COLONOSCOPY N/A 08/13/2017   Procedure: COLONOSCOPY;  Surgeon: Daneil Dolin, MD;  [facility];  Service:  Endoscopy;  Laterality: N/A;  8:30 AM  . ESOPHAGOGASTRODUODENOSCOPY N/A 09/11/2016   Procedure: ESOPHAGOGASTRODUODENOSCOPY (EGD);  Surgeon: Daneil Dolin, MD;  [facility];  Service: Endoscopy;  Laterality: N/A;  7:45 am - moved to 10/11 @ 10:30  . ESOPHAGOGASTRODUODENOSCOPY (EGD) WITH PROPOFOL N/A 09/17/2020   [name]: Diffuse moderate inflammation characterized by erosions and erythema found in the entire stomach.  Biopsies positive for H. pylori.  Patient has not been treated due to drug allergies.  Marland Kitchen Heel spurs    . MALONEY DILATION N/A 09/11/2016   Procedure: Venia Minks DILATION;  Surgeon: Daneil Dolin, MD;  [facility];  Service: Endoscopy;  Laterality: N/A;  . POLYPECTOMY  08/13/2017   Procedure: POLYPECTOMY;  Surgeon: Daneil Dolin, MD;  [facility];  Service: Endoscopy;;  colon  . SHOULDER ARTHROSCOPY    . TUBAL LIGATION     Family History  Problem Relation Age of Onset  . Heart failure Father        Deceased  . Heart attack Father        Deceased  . Stroke Sister        Deceased  . Heart failure Brother        Deceased  . Cancer Brother        Deceased  . Heart attack Brother        Deceased  . Colon cancer Neg Hx    Social History   Tobacco Use  . Smoking status: Former Smoker    Types: Cigarettes    Start date: 12/02/1977    Quit date: 12/02/2002    Years since quitting: 17.9  . Smokeless tobacco: Never Used  Vaping Use  . Vaping Use: Never used  Substance Use Topics  . Alcohol use: No    Alcohol/week: 0.0 standard drinks  . Drug use: No    ROS:  General: Negative for anorexia, weight loss, fever, chills, fatigue, weakness. ENT: Negative for hoarseness, difficulty swallowing , nasal congestion. CV: Negative for chest pain, angina, palpitations, dyspnea on exertion, peripheral edema.  Respiratory: Negative for dyspnea at rest, dyspnea on exertion, cough, sputum, wheezing.  GI: See history of present illness. GU:  Negative  for dysuria, hematuria, urinary incontinence, urinary frequency, nocturnal urination.  Endo: Negative for unusual weight change.    Physical Examination:   BP 132/78   Pulse 64   Temp (!) 96.3 F (35.7 C) (Temporal)   Ht 5' (1.524 m)   Wt 210 lb 9.6 oz (95.5 kg)   BMI 41.13 kg/m   General: Well-nourished, well-developed in no acute distress.  Eyes: No icterus. Mouth: masked Lungs: Clear to auscultation bilaterally.  Heart: Regular rate and rhythm, no murmurs rubs or gallops.  Abdomen: Bowel sounds are normal, nontender, nondistended, no hepatosplenomegaly or masses, no abdominal bruits or hernia , no rebound or guarding.   Extremities: No lower extremity edema. No clubbing or deformities. Neuro: Alert and oriented x 4   Skin: Warm and dry, no jaundice.   Psych: Alert and cooperative, normal mood and affect.  Labs:  Lab Results  Component Value Date   CREATININE 0.97 10/20/2020   BUN 15 10/20/2020   NA 137 10/20/2020   K 5.3 (H) 10/20/2020   CL 107 10/20/2020   CO2 26 10/20/2020   Lab Results  Component Value Date   CREATININE 0.97 10/20/2020   BUN 15 10/20/2020   NA 137 10/20/2020   K 5.3 (H) 10/20/2020   CL 107 10/20/2020   CO2 26 10/20/2020   Lab Results  Component Value Date   ALT 31 09/16/2020   AST 33 09/16/2020   ALKPHOS 81 09/16/2020   BILITOT 0.9 09/16/2020   Lab Results  Component Value Date   WBC 7.4 09/17/2020   HGB 10.7 (L) 09/17/2020   HCT 33.5 (L) 09/17/2020   MCV 89.8 09/17/2020   PLT 262 09/17/2020   Lab Results  Component Value Date   IRON 35 (L) 08/23/2020   TIBC 357 08/23/2020   FERRITIN 10 (L) 08/23/2020   Lab Results  Component Value Date   VITAMINB12 403 08/23/2020   No results found for: FOLATE   Imaging Studies: No results found.  Impression/plan:  Pleasant 69 year old female with history of chronic combined systolic and diastolic heart failure, CAD, A. fib on Eliquis presenting for follow-up of hospitalization for  melena back in October.  Since she was last seen she has been started on Tikosyn for her A. fib, remains on Eliquis.  EGD back in October showed H. pylori gastritis.  She cannot take Prevpac due to amoxicillin allergy.  Now that she is on Tikosyn, clarithromycin as part of H. pylori treatment is not ideal due to drug drug interactions.  She will likely require treatment with bismuth, Flagyl, tetracycline, PPI.  Once she is treated, we will confirm eradication.  Due to patient's iron deficiency anemia has been well documented, previous history of tubular adenomas, one with high-grade dysplasia at time of colonoscopy in 2018, it is recommended that she update her colonoscopy at this time.  She is considered ASA IV. We will request cardiac clearance from Dr. Rayann Heman. Once obtained, we will schedule colonoscopy with Dr. Gala Romney with propofol.   Update anemia and renal labs at this time.

## 2020-11-01 ENCOUNTER — Encounter: Payer: Self-pay | Admitting: Gastroenterology

## 2020-11-01 DIAGNOSIS — B9681 Helicobacter pylori [H. pylori] as the cause of diseases classified elsewhere: Secondary | ICD-10-CM | POA: Insufficient documentation

## 2020-11-01 NOTE — Progress Notes (Signed)
Cc'ed to pcp °

## 2020-11-03 ENCOUNTER — Telehealth: Payer: Self-pay | Admitting: Gastroenterology

## 2020-11-03 ENCOUNTER — Telehealth: Payer: Self-pay

## 2020-11-03 NOTE — Telephone Encounter (Signed)
Can we please request cardiac clearance for a colonoscopy? Patient recently seen by cardiology.   We will also need to hold Eliquis for 48 hours before TCS.

## 2020-11-03 NOTE — Telephone Encounter (Signed)
Can we find out if patient ever received H.pylori treatment? See result note to 09/17/20 pathology.

## 2020-11-03 NOTE — Telephone Encounter (Signed)
Pt was seen in our office on 10/31/20 and is due for a colonoscopy. Treasa School, PA is requesting cardiac clearance, please advise.

## 2020-11-03 NOTE — Telephone Encounter (Signed)
Cardiac clearance message sent. See other phone note.

## 2020-11-03 NOTE — Telephone Encounter (Signed)
Pt was not treated for H. Pylori. On 09/20/20 I sent a message to Dr. Abbey Chatters stating an alert came up for Prevpac since pt is allergic to Amoxicillin, please advise.   Spoke with pt and she is aware that we are requesting cardiac clearance from her cardiologist. Pt will receive a call when her cardiologist responds message sent for clearance. Pt also is aware that we will get back with her in reference to what she can take for the H. Pylori.

## 2020-11-03 NOTE — Telephone Encounter (Signed)
Noted. Routing message.  

## 2020-11-08 ENCOUNTER — Ambulatory Visit: Payer: PPO | Admitting: Pulmonary Disease

## 2020-11-08 ENCOUNTER — Encounter: Payer: Self-pay | Admitting: Pulmonary Disease

## 2020-11-08 ENCOUNTER — Other Ambulatory Visit: Payer: Self-pay

## 2020-11-08 VITALS — BP 134/78 | HR 65 | Temp 97.0°F | Ht 60.0 in | Wt 210.0 lb

## 2020-11-08 DIAGNOSIS — Z6841 Body Mass Index (BMI) 40.0 and over, adult: Secondary | ICD-10-CM | POA: Diagnosis not present

## 2020-11-08 DIAGNOSIS — R0683 Snoring: Secondary | ICD-10-CM | POA: Diagnosis not present

## 2020-11-08 MED ORDER — BISMUTH SUBSALICYLATE 262 MG PO CHEW: 524.0000 mg | CHEWABLE_TABLET | Freq: Four times a day (QID) | ORAL | 0 refills | Status: AC

## 2020-11-08 MED ORDER — METRONIDAZOLE 500 MG PO TABS: 500.0000 mg | ORAL_TABLET | Freq: Four times a day (QID) | ORAL | 0 refills | Status: AC

## 2020-11-08 MED ORDER — TETRACYCLINE HCL 500 MG PO CAPS: 500.0000 mg | ORAL_CAPSULE | Freq: Four times a day (QID) | ORAL | 0 refills | Status: AC

## 2020-11-08 NOTE — Progress Notes (Signed)
[facility]  Chief Complaint  Patient presents with  . Consult    sleep consult    Constitutional:  BP 134/78 (BP Location: Left Arm, Cuff Size: Normal)   Pulse 65   Temp (!) 97 F (36.1 C) (Other (Comment)) Comment (Src): wrist  Ht 5' (1.524 m)   Wt 210 lb (95.3 kg)   SpO2 98% Comment: room air  BMI 41.01 kg/m   Past Medical History:  CAD, CHF, HTN, HLD, A Fib, Hypothyroidism, Vit D deficiency  Past Surgical History:  Her  has a past surgical history that includes Tubal ligation; Abdominal hysterectomy; Cholecystectomy; Shoulder arthroscopy; Heel spurs; Cardiac catheterization (N/A, 06/06/2015); Esophagogastroduodenoscopy (N/A, 09/11/2016); maloney dilation (N/A, 09/11/2016); Colonoscopy; Colonoscopy (N/A, 08/13/2017); polypectomy (08/13/2017); Cardioversion (N/A, 09/14/2020); Esophagogastroduodenoscopy (egd) with propofol (N/A, 09/17/2020); and biopsy (09/17/2020).  Brief Summary:  Maria Fry is a 69 y.o. female former smoker with snoring.      Subjective:   She is followed by cardiology.  Noted to have snoring.  Advised to have sleep assessment.  She feels her sleep is better since she lost about 8 lbs and got started on therapy for A fib.  She still snores some.    She goes to sleep at 10 pm.  She falls asleep few seconds.  She wakes up 1 or 2 times to use the bathroom.  She gets out of bed at 8 am.  She feels okay in the morning.  She denies morning headache.  She does not use anything to help her fall sleep or stay awake.  She denies sleep walking, sleep talking, bruxism, or nightmares.  There is no history of restless legs.  She denies sleep hallucinations, sleep paralysis, or cataplexy.  The Epworth score is 1 out of 24.    Physical Exam:   Appearance - well kempt   ENMT - no sinus tenderness, no oral exudate, no LAN, Mallampati 4 airway, no stridor, wears dentures  Respiratory - equal breath sounds bilaterally, no  wheezing or rales  CV - s1s2 regular rate and rhythm, no murmurs  Ext - no clubbing, no edema  Skin - no rashes  Psych - normal mood and affect   Sleep Tests:    Cardiac Tests:   Echo 08/29/20 >> EF 40 to 45%, severe LVH, mild/mod MR, severe LA/RA dilation, mild/mod TR, PASP 50 mmHg  Social History:  She  reports that she quit smoking about 17 years ago. Her smoking use included cigarettes. She started smoking about 42 years ago. She has a 20.00 pack-year smoking history. She has never used smokeless tobacco. She reports that she does not drink alcohol and does not use drugs.  Family History:  Her family history includes Cancer in her brother; Heart attack in her brother and father; Heart failure in her brother and father; Stroke in her sister.    Discussion:  She has snoring, sleep disruption, and apnea.  She has BMI > 35.  She has history of CAD, combined CHF, and A fib.  I am concerned she could have obstructive sleep apnea.  Assessment/Plan:   Snoring with excessive daytime sleepiness. - will need to arrange for a home sleep study  Obesity. - discussed how weight can impact sleep and risk for sleep disordered breathing - discussed options to assist with weight loss: combination of diet modification, cardiovascular and strength training exercises  Cardiovascular risk. - had an extensive discussion regarding the adverse health consequences related to untreated  sleep disordered breathing - specifically discussed the risks for hypertension, coronary artery disease, cardiac dysrhythmias, cerebrovascular disease, and diabetes - lifestyle modification discussed  Safe driving practices. - discussed how sleep disruption can increase risk of accidents, particularly when driving - safe driving practices were discussed  Therapies for obstructive sleep apnea. - if the sleep study shows significant sleep apnea, then various therapies for treatment were reviewed: CPAP, oral  appliance, and surgical interventions  Time Spent Involved in Patient Care on Day of Examination:  32 minutes  Follow up:  Patient Instructions  Will arrange for home sleep study Will call to arrange for follow up after sleep study reviewed   Medication List:   Allergies as of 11/08/2020      Reactions   Lisinopril Anaphylaxis   Ranolazine Anaphylaxis   Dizzy, Reaction occurred with generic med   Vicodin [hydrocodone-acetaminophen] Diarrhea, Nausea And Vomiting   Amoxicillin Swelling   Demerol [meperidine] Nausea And Vomiting   Lodine [etodolac] Rash      Medication List       Accurate as of November 08, 2020 12:15 PM. If you have any questions, ask your nurse or doctor.        acetaminophen 500 MG tablet Commonly known as: TYLENOL Take 500 mg by mouth every 6 (six) hours as needed for mild pain or moderate pain. Reported on 04/22/2016   apixaban 5 MG Tabs tablet Commonly known as: Eliquis Take 1 tablet (5 mg total) by mouth 2 (two) times daily.   atorvastatin 80 MG tablet Commonly known as: LIPITOR Take 1 tablet (80 mg total) by mouth at bedtime.   bismuth subsalicylate 449 MG chewable tablet Commonly known as: Pepto-Bismol Chew 2 tablets (524 mg total) by mouth in the morning, at noon, in the evening, and at bedtime for 14 days. Must take pantoprazole 40mg  twice daily before breakfast and evening meal while on H.pylori tx.   calcium carbonate 600 MG Tabs tablet Commonly known as: Calcium 600 Take 1 tablet (600 mg total) by mouth 2 (two) times daily with a meal.   dofetilide 125 MCG capsule Commonly known as: TIKOSYN Take 1 capsule (125 mcg total) by mouth 2 (two) times daily.   ferrous sulfate 325 (65 FE) MG EC tablet Take 325 mg by mouth daily.   furosemide 40 MG tablet Commonly known as: Lasix Take 40 mg twice a day 4 days/week (Tuesday, Thursday, Saturday, Sunday) and take 40 mg  daily 3 days/week (Monday, Wednesday and Friday)   gabapentin 300 MG  capsule Commonly known as: NEURONTIN Take 300 mg by mouth 2 (two) times daily.   levothyroxine 100 MCG tablet Commonly known as: SYNTHROID TAKE 1 TABLET BY MOUTH ONCE DAILY BEFORE BREAKFAST What changed: See the new instructions.   loratadine 10 MG tablet Commonly known as: CLARITIN Take 10 mg by mouth daily.   magnesium oxide 400 MG tablet Commonly known as: MAG-OX Take 0.5 tablets (200 mg total) by mouth daily.   metoprolol tartrate 25 MG tablet Commonly known as: LOPRESSOR Take 1 tablet (25 mg total) by mouth 2 (two) times daily.   metroNIDAZOLE 500 MG tablet Commonly known as: FLAGYL Take 1 tablet (500 mg total) by mouth 4 (four) times daily for 14 days. Must take pantoprazole 40mg  twice daily before breakfast and evening meal while on H.pylori tx.   nitroGLYCERIN 0.4 MG SL tablet Commonly known as: NITROSTAT DISSOLVE ONE TABLET UNDER THE TONGUE EVERY 5 MINUTES AS NEEDED FOR CHEST PAIN.  DO NOT EXCEED  A TOTAL OF 3 DOSES IN 15 MINUTES What changed:   how much to take  how to take this  when to take this  reasons to take this   pantoprazole 40 MG tablet Commonly known as: PROTONIX Take 1 tablet (40 mg total) by mouth 2 (two) times daily.   potassium chloride SA 20 MEQ tablet Commonly known as: KLOR-CON Take 2 tablets (40 mEq total) by mouth daily.   ranolazine 500 MG 12 hr tablet Commonly known as: Ranexa Take 1 tablet (500 mg total) by mouth 2 (two) times daily.   tetracycline 500 MG capsule Commonly known as: SUMYCIN Take 1 capsule (500 mg total) by mouth 4 (four) times daily for 14 days. Must take pantoprazole 40mg  twice daily before breakfast and evening meal while on H.pylori tx.   Vitamin D 50 MCG (2000 UT) Caps Take 1 tablet daily What changed:   how much to take  how to take this  when to take this  additional instructions       Signature:  Chesley Mires, MD [place] Pager - [phone] 11/08/2020, 12:15 PM

## 2020-11-08 NOTE — Telephone Encounter (Addendum)
Routing to primary cardiologist for cardiac clearance as per statement by Malka So, PA.     [place], [place], PA to Verta Ellen., NP . Maria Fry, Maria Fry     01/0/93 9:59 AM Patient would need to remain on anticoagulation until 11/09/20 (one month post pharmacologic cardioversion with dofetilide). May hold anticoagulation after that date for procedure. Forwarding to primary cardiology for cardiac clearance.

## 2020-11-08 NOTE — Telephone Encounter (Signed)
Please let pt know we are still waiting to hear back from cardiology to schedule her for procedure.   For H.pylori: I have sent in RX for tetracycline, metronidazole, Pepto bismol. She will also need to continue her pantoprazole 40mg  bid before breakfast and evening meal while on the three new medications listed. Three new meds last for 14 days.   She has h/o rash to [employer] (lodine). Tell her Pepto is technically also a NSAID and it flagged me due to her allergy to etolodac. Has she ever had problem with Pepto? Please watch for hives, rash, swelling of any kind. If she sees this, she should stop medication, take benadryl and let provider know. Options for H.pylori were limited due to her tikosyn use.  Please plan on H.pylori stool antigen in 8 weeks after she completes regimen. She will need to be off antibiotics and PPI (pantoprazole) for two weeks before testing.

## 2020-11-08 NOTE — Telephone Encounter (Signed)
Lmom, waiting on a return call.  

## 2020-11-08 NOTE — Patient Instructions (Signed)
Will arrange for home sleep study Will call to arrange for follow up after sleep study reviewed  

## 2020-11-08 NOTE — Addendum Note (Signed)
Addended by: Mahala Menghini on: 11/08/2020 10:26 AM   Modules accepted: Orders

## 2020-11-09 ENCOUNTER — Telehealth: Payer: Self-pay

## 2020-11-09 ENCOUNTER — Other Ambulatory Visit: Payer: Self-pay

## 2020-11-09 DIAGNOSIS — A048 Other specified bacterial intestinal infections: Secondary | ICD-10-CM

## 2020-11-09 NOTE — Telephone Encounter (Signed)
Yes, pt is aware to continue Pantoprazole 40 mg bid while on H. Pylori medication.

## 2020-11-09 NOTE — Telephone Encounter (Signed)
FYI, Spoke with pt. Pt is aware that she will take Metronidazole, Pepto Bismol and Tetracycline to treat h. Pylori. Pt has taken Pepto Bismol before with no problems. Pt will monitor for any allergic reactions. Pt will complete h. Pylori stool antigen 8 weeks after treatment. Pt is aware that she will have to be off PPI before submitting for this test.

## 2020-11-09 NOTE — Telephone Encounter (Signed)
error 

## 2020-11-09 NOTE — Telephone Encounter (Signed)
Just to clarify, is patient aware to continue pantoprazole 40mg  BID while on H.pylori treatment (pepto, metronidazole, tetracycline)?

## 2020-11-13 ENCOUNTER — Ambulatory Visit: Payer: PPO | Admitting: Internal Medicine

## 2020-11-13 NOTE — Telephone Encounter (Signed)
Okay thanks [employer].  It appears she had an EGD in October.  I do not see where she has an upcoming colonoscopy as of yet.  Thank you

## 2020-11-15 ENCOUNTER — Ambulatory Visit: Payer: PPO | Admitting: Internal Medicine

## 2020-11-22 NOTE — Telephone Encounter (Signed)
Called pt, TCS w/Dr. Gala Romney scheduled for 01/18/21 at 11:00am. Informed her to hold Iron for 7 days prior to procedure and Eliquis for 48 hours. Orders entered.

## 2020-11-22 NOTE — Telephone Encounter (Signed)
We need to get patient on schedule for colonoscopy so that Cardiology can proceed with cardiac clearance.     Please schedule colonoscopy with propofol with Dr. Gala Romney. ASA IV. Dx: IDA, h/o tubular adenomas with high grade dysplasia.    -Hold iron 7 days. -Hold Eliquis 48 hours before colonoscopy. -Let me know what date she will be done. -Route telephone note back to Karle Plumber, NP once she is scheduled.  -remind patient to get my labs done (CBC, iron/tibc/ferritin).

## 2020-11-22 NOTE — Telephone Encounter (Signed)
Noted  

## 2020-11-22 NOTE — Telephone Encounter (Signed)
She is scheduled for Colonoscopy 01/18/21.

## 2020-11-22 NOTE — Telephone Encounter (Signed)
Noted. Called pt and she has already received call from our office.

## 2020-11-22 NOTE — Telephone Encounter (Signed)
Pre-op/covid test 01/16/21. Appt letter mailed with procedure instructions.

## 2020-11-30 DIAGNOSIS — D649 Anemia, unspecified: Secondary | ICD-10-CM | POA: Diagnosis not present

## 2020-12-01 LAB — CBC WITH DIFFERENTIAL/PLATELET
Absolute Monocytes: 518 cells/uL (ref 200–950)
Basophils Absolute: 28 cells/uL (ref 0–200)
Basophils Relative: 0.4 %
Eosinophils Absolute: 152 cells/uL (ref 15–500)
Eosinophils Relative: 2.2 %
HCT: 33 % — ABNORMAL LOW (ref 35.0–45.0)
Hemoglobin: 11 g/dL — ABNORMAL LOW (ref 11.7–15.5)
Lymphs Abs: 2387 cells/uL (ref 850–3900)
MCH: 29.8 pg (ref 27.0–33.0)
MCHC: 33.3 g/dL (ref 32.0–36.0)
MCV: 89.4 fL (ref 80.0–100.0)
MPV: 11.2 fL (ref 7.5–12.5)
Monocytes Relative: 7.5 %
Neutro Abs: 3816 cells/uL (ref 1500–7800)
Neutrophils Relative %: 55.3 %
Platelets: 257 10*3/uL (ref 140–400)
RBC: 3.69 10*6/uL — ABNORMAL LOW (ref 3.80–5.10)
RDW: 18 % — ABNORMAL HIGH (ref 11.0–15.0)
Total Lymphocyte: 34.6 %
WBC: 6.9 10*3/uL (ref 3.8–10.8)

## 2020-12-01 LAB — IRON,TIBC AND FERRITIN PANEL
%SAT: 16 % (calc) (ref 16–45)
Ferritin: 20 ng/mL (ref 16–288)
Iron: 49 ug/dL (ref 45–160)
TIBC: 298 mcg/dL (calc) (ref 250–450)

## 2020-12-01 NOTE — Telephone Encounter (Signed)
Appears she has upcoming appointments with Dr. Johney Frame and Rennis Harding NP. Still need cardiac clearance.

## 2020-12-06 ENCOUNTER — Other Ambulatory Visit: Payer: Self-pay | Admitting: Family Medicine

## 2020-12-21 ENCOUNTER — Other Ambulatory Visit: Payer: Self-pay

## 2020-12-21 ENCOUNTER — Telehealth: Payer: Self-pay | Admitting: Internal Medicine

## 2020-12-21 ENCOUNTER — Telehealth: Payer: Self-pay | Admitting: *Deleted

## 2020-12-21 ENCOUNTER — Telehealth: Payer: Self-pay

## 2020-12-21 DIAGNOSIS — A048 Other specified bacterial intestinal infections: Secondary | ICD-10-CM

## 2020-12-21 NOTE — Telephone Encounter (Signed)
Pt has apt with Dr. Rayann Heman on 12/22/20 and apt with Dr. Leonides Sake on 01/05/21. Will send letter for clearance needed to both doctors.

## 2020-12-21 NOTE — Telephone Encounter (Signed)
Encounter not needed patient changed her mind.

## 2020-12-21 NOTE — Telephone Encounter (Signed)
   [facility] Pre-operative Risk Assessment    HEARTCARE STAFF: - Please ensure there is not already an duplicate clearance open for this procedure. - Under Visit Info/Reason for Call, type in Other and utilize the format Clearance MM/DD/YY or Clearance TBD. Do not use dashes or single digits. - If request is for dental extraction, please clarify the # of teeth to be extracted.  Request for surgical clearance: PT HAS APPT WITH DR. ALLRED 12/22/20.   1. What type of surgery is being performed? COLONOSCOPY   2. When is this surgery scheduled? 01/18/21   3. What type of clearance is required (medical clearance vs. Pharmacy clearance to hold med vs. Both)? BOTH  4. Are there any medications that need to be held prior to surgery and how long? ELIQUIS x 48 HOURS PRIOR TO PROCEDURE   5. Practice name and name of physician performing surgery? [facility]; LESLIE LEWIS, PA   6. What is the office phone number? [phone]   7.   What is the office fax number? [phone]  8.   Anesthesia type (None, local, MAC, general) ? NOT LISTED   Julaine Hua 12/21/2020, 11:42 AM  _________________________________________________________________   (provider comments below)

## 2020-12-21 NOTE — Telephone Encounter (Signed)
Lab form mailed to the pt.

## 2020-12-22 ENCOUNTER — Encounter: Payer: Self-pay | Admitting: Internal Medicine

## 2020-12-22 ENCOUNTER — Ambulatory Visit: Payer: PPO | Admitting: Internal Medicine

## 2020-12-22 ENCOUNTER — Other Ambulatory Visit: Payer: Self-pay

## 2020-12-22 ENCOUNTER — Encounter: Payer: Self-pay | Admitting: Pulmonary Disease

## 2020-12-22 VITALS — BP 122/84 | HR 63 | Ht 60.0 in | Wt 212.4 lb

## 2020-12-22 DIAGNOSIS — I4819 Other persistent atrial fibrillation: Secondary | ICD-10-CM | POA: Diagnosis not present

## 2020-12-22 DIAGNOSIS — I2510 Atherosclerotic heart disease of native coronary artery without angina pectoris: Secondary | ICD-10-CM

## 2020-12-22 DIAGNOSIS — I5042 Chronic combined systolic (congestive) and diastolic (congestive) heart failure: Secondary | ICD-10-CM | POA: Diagnosis not present

## 2020-12-22 DIAGNOSIS — I10 Essential (primary) hypertension: Secondary | ICD-10-CM

## 2020-12-22 DIAGNOSIS — D6869 Other thrombophilia: Secondary | ICD-10-CM

## 2020-12-22 NOTE — Patient Instructions (Addendum)
Medication Instructions:  Continue current medications *If you need a refill on your cardiac medications before your next appointment, please call your pharmacy*   Lab Work: BMP, Mg  If you have labs (blood work) drawn today and your tests are completely normal, you will receive your results only by: Marland Kitchen MyChart Message (if you have MyChart) OR . A paper copy in the mail If you have any lab test that is abnormal or we need to change your treatment, we will call you to review the results.   Testing/Procedures: none   Follow-Up: At [facility] [place], you and your health needs are our priority.  As part of our continuing mission to provide you with exceptional heart care, we have created designated Provider Care Teams.  These Care Teams include your primary Cardiologist (physician) and Advanced Practice Providers (APPs -  Physician Assistants and Nurse Practitioners) who all work together to provide you with the care you need, when you need it.  We recommend signing up for the patient portal called "MyChart".  Sign up information is provided on this After Visit Summary.  MyChart is used to connect with patients for Virtual Visits (Telemedicine).  Patients are able to view lab/test results, encounter notes, upcoming appointments, etc.  Non-urgent messages can be sent to your provider as well.   To learn more about what you can do with MyChart, go to [email or web address].    Your next appointment:   3 month(s)  The format for your next appointment:   In Person  Provider:   You will follow up in the [facility] located at [facility]. Your provider will be: [name], NP or Clint R. Fenton, PA-C   Other Instructions  6 months with Dr. Rayann Heman in [place]

## 2020-12-22 NOTE — Progress Notes (Signed)
PCP: Alycia Rossetti, MD Primary Cardiologist: previously Dr Bronson Ing Primary EP: Dr Rayann Heman  Maria Fry is a 70 y.o. female who presents today for routine electrophysiology followup.  Since last being seen in our clinic, the patient reports doing very well. She feels "much better" on tikosyn.  Energy is much improved. Today, she denies symptoms of palpitations, chest pain, shortness of breath,  lower extremity edema, dizziness, presyncope, or syncope.  The patient is otherwise without complaint today.   Past Medical History:  Diagnosis Date  . Atrial fibrillation ([place])   . CAD (coronary artery disease)    a. 06/07/15 NSTEMI/Cath: Dist LAD 100%, mid LAD 10%. Otw nl cors. Nl EF w/ inferoapical AK.  Marland Kitchen Chronic combined systolic (congestive) and diastolic (congestive) heart failure ([place]) 04/23/2016   a. 04/2016 Echo: EF 45%, Gr2 DD; b. 11/2019 Echo: EF 45-50%, mod LVH. Gr2 DD. Nl RV fxn. Sev dil LA.   Marland Kitchen Complication of anesthesia   . Essential hypertension   . Hip pain   . Hyperlipidemia   . Hypothyroidism   . MI (myocardial infarction) ([place]) 06/05/2015  . PONV (postoperative nausea and vomiting)   . Vitamin D deficiency    Past Surgical History:  Procedure Laterality Date  . ABDOMINAL HYSTERECTOMY    . BIOPSY  09/17/2020   Procedure: BIOPSY;  Surgeon: [name], DO;  [facility];  Service: Endoscopy;;  . CARDIAC CATHETERIZATION N/A 06/06/2015   Procedure: Left Heart Cath and Coronary Angiography;  Surgeon: Peter M Martinique, MD;  Location: [facility];  Service: Cardiovascular;  Laterality: N/A;  . CARDIOVERSION N/A 09/14/2020   Procedure: CARDIOVERSION;  Surgeon: Satira Sark, MD;  Location: [facility];  Service: Cardiovascular;  Laterality: N/A;  . CHOLECYSTECTOMY    . COLONOSCOPY    . COLONOSCOPY N/A 08/13/2017   Procedure: COLONOSCOPY;  Surgeon: Daneil Dolin, MD;  [facility];  Service: Endoscopy;  Laterality: N/A;  8:30 AM  .  ESOPHAGOGASTRODUODENOSCOPY N/A 09/11/2016   Procedure: ESOPHAGOGASTRODUODENOSCOPY (EGD);  Surgeon: Daneil Dolin, MD;  [facility];  Service: Endoscopy;  Laterality: N/A;  7:45 am - moved to 10/11 @ 10:30  . ESOPHAGOGASTRODUODENOSCOPY (EGD) WITH PROPOFOL N/A 09/17/2020   [name]: Diffuse moderate inflammation characterized by erosions and erythema found in the entire stomach.  Biopsies positive for H. pylori.  Patient has not been treated due to drug allergies.  Marland Kitchen Heel spurs    . MALONEY DILATION N/A 09/11/2016   Procedure: Venia Minks DILATION;  Surgeon: Daneil Dolin, MD;  Location[facility];  Service: Endoscopy;  Laterality: N/A;  . POLYPECTOMY  08/13/2017   Procedure: POLYPECTOMY;  Surgeon: Daneil Dolin, MD;  Location[facility];  Service: Endoscopy;;  colon  . SHOULDER ARTHROSCOPY    . TUBAL LIGATION      ROS- all systems are reviewed and negatives except as per HPI above  Current Outpatient Medications  Medication Sig Dispense Refill  . acetaminophen (TYLENOL) 500 MG tablet Take 500 mg by mouth every 6 (six) hours as needed for mild pain or moderate pain. Reported on 04/22/2016    . apixaban (ELIQUIS) 5 MG TABS tablet Take 1 tablet (5 mg total) by mouth 2 (two) times daily. 60 tablet 11  . atorvastatin (LIPITOR) 80 MG tablet Take 1 tablet (80 mg total) by mouth at bedtime. 90 tablet 3  . calcium carbonate (CALCIUM 600) 600 MG TABS tablet Take 1 tablet (600  mg total) by mouth 2 (two) times daily with a meal. 60 tablet 6  . Cholecalciferol (VITAMIN D) 50 MCG (2000 UT) CAPS Take 1 tablet daily 30 capsule 2  . dofetilide (TIKOSYN) 125 MCG capsule Take 1 capsule (125 mcg total) by mouth 2 (two) times daily. 60 capsule 6  . ferrous sulfate 325 (65 FE) MG EC tablet Take 325 mg by mouth daily.    . furosemide (LASIX) 40 MG tablet Take 40 mg twice a day 4 days/week (Tuesday, Thursday, Saturday, Sunday) and take 40 mg  daily 3 days/week (Monday, Wednesday and Friday) 120 tablet  6  . gabapentin (NEURONTIN) 300 MG capsule Take 300 mg by mouth 2 (two) times daily.     Marland Kitchen levothyroxine (SYNTHROID) 100 MCG tablet TAKE 1 TABLET BY MOUTH ONCE DAILY BEFORE BREAKFAST 90 tablet 2  . loratadine (CLARITIN) 10 MG tablet Take 10 mg by mouth daily.    . magnesium oxide (MAG-OX) 400 MG tablet Take 0.5 tablets (200 mg total) by mouth daily. 30 tablet 6  . nitroGLYCERIN (NITROSTAT) 0.4 MG SL tablet DISSOLVE ONE TABLET UNDER THE TONGUE EVERY 5 MINUTES AS NEEDED FOR CHEST PAIN.  DO NOT EXCEED A TOTAL OF 3 DOSES IN 15 MINUTES (Patient taking differently: DISSOLVE ONE TABLET UNDER THE TONGUE EVERY 5 MINUTES AS NEEDED FOR CHEST PAIN.  DO NOT EXCEED A TOTAL OF 3 DOSES IN 15 MINUTES) 25 tablet 3  . potassium chloride SA (KLOR-CON) 20 MEQ tablet Take 2 tablets (40 mEq total) by mouth daily. 90 tablet 6  . RANEXA 500 MG 12 hr tablet Take 1 tablet by mouth twice daily 60 tablet 0  . metoprolol tartrate (LOPRESSOR) 25 MG tablet Take 1 tablet (25 mg total) by mouth 2 (two) times daily. 180 tablet 3   No current facility-administered medications for this visit.    Physical Exam: Vitals:   12/22/20 1544  BP: 122/84  Pulse: 63  SpO2: 96%  Weight: 212 lb 6.4 oz (96.3 kg)  Height: 5' (1.524 m)    GEN- The patient is well appearing, alert and oriented x 3 today.   Head- normocephalic, atraumatic Eyes-  Sclera clear, conjunctiva pink Ears- hearing intact Oropharynx- clear Lungs- Clear to ausculation bilaterally, normal work of breathing Heart- Regular rate and rhythm, no murmurs, rubs or gallops, PMI not laterally displaced GI- soft, NT, ND, + BS Extremities- no clubbing, cyanosis, or edema  Wt Readings from Last 3 Encounters:  12/22/20 212 lb 6.4 oz (96.3 kg)  11/08/20 210 lb (95.3 kg)  10/31/20 210 lb 9.6 oz (95.5 kg)    EKG tracing ordered today is personally reviewed and shows sinus rhythm 63 bpm, qtc 429 msec  Assessment and Plan:  1. Persistent afib Doing well with tikosyn She  has severe biatrial enlargement by echo obtain bmet, mg today chads2vasc score is 5.  Continue eliquis We will need to follow her closely on tikosyn to avoid toxicity  2. CAD No ischemic symptoms No chagnes  3. Chronic combined systolic and diastolic dysfunction EF [id number] Repeat echo in 6 months  4. Morbid obesity Body mass index is 41.48 kg/m. Lifestyle modification is advised  5. HTN Stable No change required today  6. Snoring Sleep study pending   Risks, benefits and potential toxicities for medications prescribed and/or refilled reviewed with patient today.   Follow-up with general cardiology as scheduled Return to the AF clinic in 3 months I will see in [place] in 6 months  Thompson Grayer MD,  [facility] 12/22/2020 3:59 PM

## 2020-12-23 LAB — BASIC METABOLIC PANEL
BUN/Creatinine Ratio: 17 (ref 12–28)
BUN: 17 mg/dL (ref 8–27)
CO2: 23 mmol/L (ref 20–29)
Calcium: 8.7 mg/dL (ref 8.7–10.3)
Chloride: 102 mmol/L (ref 96–106)
Creatinine, Ser: 1.03 mg/dL — ABNORMAL HIGH (ref 0.57–1.00)
GFR calc Af Amer: 65 mL/min/{1.73_m2} (ref >59)
GFR calc non Af Amer: 56 mL/min/{1.73_m2} — ABNORMAL LOW (ref >59)
Sodium: 142 mmol/L (ref 134–144)

## 2020-12-23 LAB — MAGNESIUM: Magnesium: 2.1 mg/dL (ref 1.6–2.3)

## 2020-12-28 NOTE — Telephone Encounter (Signed)
Received letter from Dr. Rayann Heman that states ideally, hold Eliquis 24 hours prior to the procedure. If medically necessary, hold 48 hours prior to procedure and resume as soon as possible.

## 2020-12-28 NOTE — Telephone Encounter (Signed)
Noted. Await further input from upcoming ov with Mr. Maria Sake, NP.

## 2020-12-28 NOTE — Telephone Encounter (Signed)
Noted  

## 2021-01-02 ENCOUNTER — Telehealth: Payer: Self-pay | Admitting: Pulmonary Disease

## 2021-01-02 ENCOUNTER — Ambulatory Visit (INDEPENDENT_AMBULATORY_CARE_PROVIDER_SITE_OTHER): Payer: PPO | Admitting: Pharmacist

## 2021-01-02 DIAGNOSIS — D649 Anemia, unspecified: Secondary | ICD-10-CM | POA: Diagnosis not present

## 2021-01-02 DIAGNOSIS — I4891 Unspecified atrial fibrillation: Secondary | ICD-10-CM | POA: Diagnosis not present

## 2021-01-02 DIAGNOSIS — I10 Essential (primary) hypertension: Secondary | ICD-10-CM

## 2021-01-02 DIAGNOSIS — E785 Hyperlipidemia, unspecified: Secondary | ICD-10-CM

## 2021-01-02 NOTE — Chronic Care Management (AMB) (Signed)
Chronic Care Management Pharmacy  Name: Maria Fry  MRN: [id number] DOB: [birth year]  Chief Complaint/ HPI  Maria Fry,  70 y.o. , female presents for their Initial CCM visit with the clinical pharmacist In office.  PCP : Maria Rossetti, MD  Their chronic conditions include: hypertension, CAD, HF, hypothyroidism, DDD, hyperlipidemia, anemia.  Office Visits:  05/02/2020 Maria Fry) - f/u chronic conditions, referral to CCM for help with Ranexa, currently in donut hole and copay is very expensive  Consult Visit:  04/08/2020 Maria Fry, ED) - vertigo symptoms, given Antivert and Zofran, referred to ENT  Medications: Outpatient Encounter Medications as of 01/02/2021  Medication Sig  . acetaminophen (TYLENOL) 500 MG tablet Take 500 mg by mouth every 6 (six) hours as needed for mild pain or moderate pain. Reported on 04/22/2016  . apixaban (ELIQUIS) 5 MG TABS tablet Take 1 tablet (5 mg total) by mouth 2 (two) times daily.  Marland Kitchen atorvastatin (LIPITOR) 80 MG tablet Take 1 tablet (80 mg total) by mouth at bedtime.  . calcium carbonate (CALCIUM 600) 600 MG TABS tablet Take 1 tablet (600 mg total) by mouth 2 (two) times daily with a meal.  . Cholecalciferol (VITAMIN D) 50 MCG (2000 UT) CAPS Take 1 tablet daily  . dofetilide (TIKOSYN) 125 MCG capsule Take 1 capsule (125 mcg total) by mouth 2 (two) times daily.  . ferrous sulfate 325 (65 FE) MG EC tablet Take 325 mg by mouth daily.  . furosemide (LASIX) 40 MG tablet Take 40 mg twice a day 4 days/week (Tuesday, Thursday, Saturday, Sunday) and take 40 mg  daily 3 days/week (Monday, Wednesday and Friday)  . gabapentin (NEURONTIN) 300 MG capsule Take 300 mg by mouth 2 (two) times daily.   Marland Kitchen levothyroxine (SYNTHROID) 100 MCG tablet TAKE 1 TABLET BY MOUTH ONCE DAILY BEFORE BREAKFAST  . loratadine (CLARITIN) 10 MG tablet Take 10 mg by mouth daily.  . magnesium oxide (MAG-OX) 400 MG tablet Take 0.5 tablets (200 mg total) by mouth daily.  . nitroGLYCERIN  (NITROSTAT) 0.4 MG SL tablet DISSOLVE ONE TABLET UNDER THE TONGUE EVERY 5 MINUTES AS NEEDED FOR CHEST PAIN.  DO NOT EXCEED A TOTAL OF 3 DOSES IN 15 MINUTES (Patient taking differently: DISSOLVE ONE TABLET UNDER THE TONGUE EVERY 5 MINUTES AS NEEDED FOR CHEST PAIN.  DO NOT EXCEED A TOTAL OF 3 DOSES IN 15 MINUTES)  . potassium chloride SA (KLOR-CON) 20 MEQ tablet Take 2 tablets (40 mEq total) by mouth daily.  Marland Kitchen RANEXA 500 MG 12 hr tablet Take 1 tablet by mouth twice daily  . metoprolol tartrate (LOPRESSOR) 25 MG tablet Take 1 tablet (25 mg total) by mouth 2 (two) times daily.   No facility-administered encounter medications on file as of 01/02/2021.     Current Diagnosis/Assessment: Emergency planning/management officer Strain: Low Risk   . Difficulty of Paying Living Expenses: Not very hard    Goals Addressed            This Visit's Progress   . Pharmacy Care Plan:       CARE PLAN ENTRY (see longitudinal plan of care for additional care plan information)  Current Barriers:  . Chronic Disease Management support, education, and care coordination needs related to Hypertension, Hyperlipidemia, Heart Failure, Coronary Artery Disease, and anemia   Hypertension BP Readings from Last 3 Encounters:  12/22/20 122/84  11/08/20 134/78  10/31/20 132/78   . Pharmacist Clinical Goal(s): o Over the next 120 days, patient will work with PharmD and  providers to achieve BP goal <130/80 . Current regimen:  o Metoprolol 59m twice daily . Interventions: o Reviewed home monitoring procedures o Comprehensive medication review . Patient self care activities - Over the next 180 days, patient will: o Check BP periodically, document, and provide at future appointments o Ensure daily salt intake < 2300 mg/day  Hyperlipidemia Lab Results  Component Value Date/Time   LDLCALC 63 01/03/2020 09:48 AM   . Pharmacist Clinical Goal(s): o Over the next 180 days, patient will work with PharmD and providers to maintain LDL goal  < 70 . Current regimen:  o Atorvastatin 869mdaily . Interventions: o Reviewed most recent lipid panel o Counseled on importance of compliance with statin medications. . Patient self care activities - Over the next 180 days, patient will: o Continue to take medication as prescribed o Focus on adherence by pill box  Heart Failure . Pharmacist Clinical Goal(s) o Over the next 180 days, patient will work with PharmD and providers to optimize medication and minimize symptoms related to HF. . Current regimen:   Metoprolol tartrate 2529mid  Furosemide 74m48mily four times per week, and twice daily 3 times per week . Interventions: o Counseled on importance of monitoring weight daily . Patient self care activities - Over the next 180 days, patient will: o Begin to monitor weight daily as directed  o Contact providers if you gain more than 3 pounds in one day or 5 pounds in a week.   CAD . Pharmacist Clinical Goal(s) o Over the next 180 days, patient will work with PharmD and providers to optimize medication and minimize symptoms related to CAD. . CuMarland Kitchenrent regimen:  o ASA 81mg38maenxa 1000mg 74me daily . Interventions: o Discussed cost/affordability of Ranexa. o Researched other programs patient may be eligible for copay assistance. . Patient self care activities - Over the next 180 days, patient will: o Continue to take medication as she has been, wait for PharmD to reach out regarding programs for copay assistance.  Anemia . Pharmacist Clinical Goal(s) o Over the next 120 days, patient will work with PharmD and providers to optimize medication and minimize symptoms related to anemia. . Current regimen:  o Ferrous Sulfate 325mg d50m . Interventions: o Counseled on importance of taking medication daily o Discussed switching to night time dosing if makes patient tired. o Reminded to d/c iron one week prior to colonoscopy . Patient self care activities - Over the next 120  days, patient will: o Begin to take medication in the evening to see if this helps with sleepiness  Afib . Pharmacist Clinical Goal(s) o Over the next 120 days, patient will work with PharmD and providers to optimize medication and minimize symptoms related to Afib . Current regimen:  o Eliquis 5mg twi23mdaily o Tikosyn 125 mcg  . Interventions: o Evaluated for abnormal bleed o Explained Eliquis PAP program to patient  . Patient self care activities - Over the next 120 days, patient will: o Notify PharmD when $[phone] of pocket has been met o Remember to d/c Eliquis before colonoscopy as directed  Please see past updates related to this goal by clicking on the "Past Updates" button in the selected goal           AFIB   Patient is currently rhythm controlled.   Patient has failed these meds in past: none noted Patient is currently controlled on the following medications:   Eliquis 5mg bid 12mikosyn 125  mcg   We discussed:   - Newly diagnosed Afib - Patient is controlled now on this dose of Tikosyn and also taking Eliquis - She does report some black stool that was positive for blood upon testing - She has colonoscopy on 2/17 for further diagnostics - Hgb slightly low, she denies any abnormal bruising or blood in urine - Also discussed her applying for PAP for Eliquis if she remains on it long term.  Patient will need to spend approx. $605 OOP before eligible.  She is aware and will reach out once this minimum is met.  Plan  Continue current medications  Report to colonoscopy on 2/17 Contact PharmD once out of pocket spend has been met   Hypertension     Office blood pressures are  BP Readings from Last 3 Encounters:  12/22/20 122/84  11/08/20 134/78  10/31/20 132/78    Patient has failed these meds in the past: lisinopril (swollen throat)  Patient checks BP at home infrequently  Patient home BP readings are ranging: no logs present  Patient is  currently controlled on the following medications:   Metoprolol 72m twice daily  Reports normal BP whenever she checks at home.  Office blood pressures have been mostly WNL.  Denies chest pain/dizziness.  Reports compliance with medication.  Update 01/02/21 No specific readings Office BP have been WNL  Plan  Continue current medications.     Hyperlipidemia   LDL goal < 70  Lipid Panel     Component Value Date/Time   CHOL 123 01/03/2020 0948   TRIG 68 01/03/2020 0948   HDL 46 (L) 01/03/2020 0948   LDLCALC 63 01/03/2020 0948    Hepatic Function Latest Ref Rng & Units 09/16/2020 09/15/2020 08/23/2020  Total Protein 6.5 - 8.1 g/dL 7.0 7.5 6.9  Albumin 3.5 - 5.0 g/dL 3.2(L) 3.3(L) -  AST 15 - 41 U/L 33 45(H) 25  ALT 0 - 44 U/L 31 35 28  Alk Phosphatase 38 - 126 U/L 81 88 -  Total Bilirubin 0.3 - 1.2 mg/dL 0.9 0.7 0.5  Bilirubin, Direct 0.0 - 0.2 mg/dL - - -     The ASCVD Risk score (GEdgerton, et al., 2013) failed to calculate for the following reasons:   The patient has a prior MI or stroke diagnosis   Patient has failed these meds in past: none noted Patient is currently controlled on the following medications:  . Atorvastatin 863mdaily  We discussed:  LDL controlled, patient reports compliance.  Update 01/02/21 Would benefit from updated lipid panel Reinforced adherence with medication, patient reports 100%  Plan  Continue current medications.  CAD    Patient has failed these meds in past: none noted Patient is currently controlled on the following medications:   ASA 8135mRanexa 1000m28mice daily  Patient is currently controlled with no chest pain.  Does report that she is currently in donut hole and Ranexa copay is > $500.  She cannot tolerate generic as it made her throat swell.  Will review other options for funding before alternative medication.  Patient has started taking once daily in order to lengthen her supply of medication.  Reports no  chest pain even with this dose.  Update 02/01/222 Denies recent chest pain Ranexa copay currently affordable  Plan  Continue current medications.    Anemia   Patient has failed these meds in past: none noted Patient is currently uncontrolled on the following medications:  . Ferrous Sulfate 325  mg  Patient not currently taking medication due to it making her sleepy.  Discussed that if this is the case she could switch to night time.  Patient reports she is going to switch this any try starting tonight.  Update 01/02/21 Anemia improving Hgb still slightly low at 11, continue Iron Reminded patient she is to stop this one week before her colonoscopy  Plan  Continue current medications DDD   Patient has failed these meds in past: Vicodin, Lodine (rash) Patient is currently controlled on the following medications:  . Gabapentin 330m twice daily  Pain is manageable, no side effects.  Update 01/02/21 Pain is controlled  Plan  Continue current medications.  Hypothyroidism   Lab Results  Component Value Date/Time   TSH 3.395 09/16/2020 04:20 AM   TSH 3.289 08/18/2020 03:02 PM   TSH 3.58 01/11/2020 09:00 AM   TSH 4.20 12/29/2018 08:24 AM   FREET4 1.4 01/11/2020 09:00 AM   FREET4 1.3 12/29/2018 08:24 AM    Patient has failed these meds in past: none noted Patient is currently controlled on the following medications:  . Levothyroxine 1011m  TSH normal, patient reports compliance and appropriate timing.  No symptoms noted.  Update 01/02/21 Reinforced compliance  Plan  Continue current medications  Vaccines   Reviewed and discussed patient's vaccination history.    Immunization History  Administered Date(s) Administered  . Fluad Quad(high Dose 65+) 10/21/2019, 08/18/2020  . Influenza, High Dose Seasonal PF 08/24/2018  . Influenza,inj,Quad PF,6+ Mos 10/02/2015, 08/19/2016, 08/25/2017, 09/02/2019  . PFIZER(Purple Top)SARS-COV-2 Vaccination 01/24/2020,  02/14/2020  . Pneumococcal Conjugate-13 01/01/2019  . Pneumococcal Polysaccharide-23 01/03/2020    Plan  Recommended patient receive Shingles vaccine in office.  Medication Management   . Miscellaneous medications: o Klor-Con 10 meq tablet o Nitrostat 0.1m81m OTC's:  o Tylenol 500m18mn o Loratadine 10mg65matient currently uses [facility] #  [phone] reports using pill box method to organize medications and promote adherence. . Patient denies missed doses of medication.   ChrisBeverly MilchrmD Clinical Pharmacist [place])[phone]

## 2021-01-02 NOTE — Patient Instructions (Addendum)
Visit Information  Goals Addressed            This Visit's Progress   . Pharmacy Care Plan:       CARE PLAN ENTRY (see longitudinal plan of care for additional care plan information)  Current Barriers:  . Chronic Disease Management support, education, and care coordination needs related to Hypertension, Hyperlipidemia, Heart Failure, Coronary Artery Disease, and anemia   Hypertension BP Readings from Last 3 Encounters:  12/22/20 122/84  11/08/20 134/78  10/31/20 132/78   . Pharmacist Clinical Goal(s): o Over the next 120 days, patient will work with PharmD and providers to achieve BP goal <130/80 . Current regimen:  o Metoprolol 17m twice daily . Interventions: o Reviewed home monitoring procedures o Comprehensive medication review . Patient self care activities - Over the next 180 days, patient will: o Check BP periodically, document, and provide at future appointments o Ensure daily salt intake < 2300 mg/day  Hyperlipidemia Lab Results  Component Value Date/Time   LDLCALC 63 01/03/2020 09:48 AM   . Pharmacist Clinical Goal(s): o Over the next 180 days, patient will work with PharmD and providers to maintain LDL goal < 70 . Current regimen:  o Atorvastatin 88mdaily . Interventions: o Reviewed most recent lipid panel o Counseled on importance of compliance with statin medications. . Patient self care activities - Over the next 180 days, patient will: o Continue to take medication as prescribed o Focus on adherence by pill box  Heart Failure . Pharmacist Clinical Goal(s) o Over the next 180 days, patient will work with PharmD and providers to optimize medication and minimize symptoms related to HF. . Current regimen:   Metoprolol tartrate 2517mid  Furosemide 61m27mily four times per week, and twice daily 3 times per week . Interventions: o Counseled on importance of monitoring weight daily . Patient self care activities - Over the next 180 days, patient  will: o Begin to monitor weight daily as directed  o Contact providers if you gain more than 3 pounds in one day or 5 pounds in a week.   CAD . Pharmacist Clinical Goal(s) o Over the next 180 days, patient will work with PharmD and providers to optimize medication and minimize symptoms related to CAD. . CuMarland Kitchenrent regimen:  o ASA 81mg70maenxa 1000mg 57me daily . Interventions: o Discussed cost/affordability of Ranexa. o Researched other programs patient may be eligible for copay assistance. . Patient self care activities - Over the next 180 days, patient will: o Continue to take medication as she has been, wait for PharmD to reach out regarding programs for copay assistance.  Anemia . Pharmacist Clinical Goal(s) o Over the next 120 days, patient will work with PharmD and providers to optimize medication and minimize symptoms related to anemia. . Current regimen:  o Ferrous Sulfate 325mg d12m . Interventions: o Counseled on importance of taking medication daily o Discussed switching to night time dosing if makes patient tired. o Reminded to d/c iron one week prior to colonoscopy . Patient self care activities - Over the next 120 days, patient will: o Begin to take medication in the evening to see if this helps with sleepiness  Afib . Pharmacist Clinical Goal(s) o Over the next 120 days, patient will work with PharmD and providers to optimize medication and minimize symptoms related to Afib . Current regimen:  o Eliquis 5mg twi76mdaily o Tikosyn 125 mcg  . Interventions: o Evaluated for abnormal bleed o Explained Eliquis PAP  program to patient  . Patient self care activities - Over the next 120 days, patient will: o Notify PharmD when [phone] spend out of pocket has been met o Remember to d/c Eliquis before colonoscopy as directed  Please see past updates related to this goal by clicking on the "Past Updates" button in the selected goal         The patient verbalized  understanding of instructions, educational materials, and care plan provided today and agreed to receive a mailed copy of patient instructions, educational materials, and care plan.   Telephone follow up appointment with pharmacy team member scheduled for: 4 months  Edythe Clarity, [facility]  Atrial Fibrillation  Atrial fibrillation is a type of heartbeat that is irregular or fast. If you have this condition, your heart beats without any order. This makes it hard for your heart to pump blood in a normal way. Atrial fibrillation may come and go, or it may become a long-lasting problem. If this condition is not treated, it can put you at higher risk for stroke, heart failure, and other heart problems. What are the causes? This condition may be caused by diseases that damage the heart. They include:  High blood pressure.  Heart failure.  Heart valve disease.  Heart surgery. Other causes include:  Diabetes.  Thyroid disease.  Being overweight.  Kidney disease. Sometimes the cause is not known. What increases the risk? You are more likely to develop this condition if:  You are older.  You smoke.  You exercise often and very hard.  You have a family history of this condition.  You are a man.  You use drugs.  You drink a lot of alcohol.  You have lung conditions, such as emphysema, pneumonia, or COPD.  You have sleep apnea. What are the signs or symptoms? Common symptoms of this condition include:  A feeling that your heart is beating very fast.  Chest pain or discomfort.  Feeling short of breath.  Suddenly feeling light-headed or weak.  Getting tired easily during activity.  Fainting.  Sweating. In some cases, there are no symptoms. How is this treated? Treatment for this condition depends on underlying conditions and how you feel when you have atrial fibrillation. They include:  Medicines to: ? Prevent blood clots. ? Treat heart rate or heart rhythm  problems.  Using devices, such as a pacemaker, to correct heart rhythm problems.  Doing surgery to remove the part of the heart that sends bad signals.  Closing an area where clots can form in the heart (left atrial appendage). In some cases, your doctor will treat other underlying conditions. Follow these instructions at home: Medicines  Take over-the-counter and prescription medicines only as told by your doctor.  Do not take any new medicines without first talking to your doctor.  If you are taking blood thinners: ? Talk with your doctor before you take any medicines that have aspirin or NSAIDs, such as ibuprofen, in them. ? Take your medicine exactly as told by your doctor. Take it at the same time each day. ? Avoid activities that could hurt or bruise you. Follow instructions about how to prevent falls. ? Wear a bracelet that says you are taking blood thinners. Or, carry a card that lists what medicines you take. Lifestyle  Do not use any products that have nicotine or tobacco in them. These include cigarettes, e-cigarettes, and chewing tobacco. If you need help quitting, ask your doctor.  Eat heart-healthy foods. Talk with  your doctor about the right eating plan for you.  Exercise regularly as told by your doctor.  Do not drink alcohol.  Lose weight if you are overweight.  Do not use drugs, including cannabis.      General instructions  If you have a condition that causes breathing to stop for a short period of time (apnea), treat it as told by your doctor.  Keep a healthy weight. Do not use diet pills unless your doctor says they are safe for you. Diet pills may make heart problems worse.  Keep all follow-up visits as told by your doctor. This is important. Contact a doctor if:  You notice a change in the speed, rhythm, or strength of your heartbeat.  You are taking a blood-thinning medicine and you get more bruising.  You get tired more easily when you move or  exercise.  You have a sudden change in weight. Get help right away if:  You have pain in your chest or your belly (abdomen).  You have trouble breathing.  You have side effects of blood thinners, such as blood in your vomit, poop (stool), or pee (urine), or bleeding that cannot stop.  You have any signs of a stroke. "BE FAST" is an easy way to remember the main warning signs: ? B - Balance. Signs are dizziness, sudden trouble walking, or loss of balance. ? E - Eyes. Signs are trouble seeing or a change in how you see. ? F - Face. Signs are sudden weakness or loss of feeling in the face, or the face or eyelid drooping on one side. ? A - Arms. Signs are weakness or loss of feeling in an arm. This happens suddenly and usually on one side of the body. ? S - Speech. Signs are sudden trouble speaking, slurred speech, or trouble understanding what people say. ? T - Time. Time to call emergency services. Write down what time symptoms started.  You have other signs of a stroke, such as: ? A sudden, very bad headache with no known cause. ? Feeling like you may vomit (nausea). ? Vomiting. ? A seizure. These symptoms may be an emergency. Do not wait to see if the symptoms will go away. Get medical help right away. Call your local emergency services (911 in the [place] Do not drive yourself to the hospital.   Summary  Atrial fibrillation is a type of heartbeat that is irregular or fast.  You are at higher risk of this condition if you smoke, are older, have diabetes, or are overweight.  Follow your doctor's instructions about medicines, diet, exercise, and follow-up visits.  Get help right away if you have signs or symptoms of a stroke.  Get help right away if you cannot catch your breath, or you have chest pain or discomfort. This information is not intended to replace advice given to you by your health care provider. Make sure you discuss any questions you have with your health care  provider. Document Revised: 05/12/2019 Document Reviewed: 05/12/2019 Elsevier Patient Education  [place].

## 2021-01-04 NOTE — Progress Notes (Addendum)
Cardiology Office Note  Date: 01/05/2021   ID: Maria Fry, DOB January 29, 1951, MRN [id number]  PCP:  Alycia Rossetti, MD  Cardiologist:  No primary care provider on file. Electrophysiologist:  Thompson Grayer, MD   Chief Complaint: 68-month follow-up and preop clearance  History of Present Illness: Maria Fry is a 70 y.o. female with a history of CAD, chronic combined systolic and diastolic heart failure, HTN, HLD, hypothyroidism. (New diagnosis of atrial fibrillation at 08/18/2020 visit).  Last encounter with Dr. Rayann Heman 12/22/2020.  Since previously being seen at EP clinic she reported doing very well and feeling much better on Tikosyn.  Energy was much improved.  She denied any palpitations, chest pain, shortness of breath, lower extremity edema, dizziness, presyncope or syncope.  She is here for 68-month follow up today and for preop clearance for colonoscopy.  She denies any recent palpitations or arrhythmias, orthostatic symptoms, stroke or TIA-like symptoms, dyspnea/shortness of breath, weight gain, PND, orthopnea, lower extremity edema.  She has an upcoming colonoscopy Dr. Gala Romney on 01/18/2021.   Past Medical History:  Diagnosis Date  . Atrial fibrillation ([place])   . CAD (coronary artery disease)    a. 06/07/15 NSTEMI/Cath: Dist LAD 100%, mid LAD 10%. Otw nl cors. Nl EF w/ inferoapical AK.  Marland Kitchen Chronic combined systolic (congestive) and diastolic (congestive) heart failure ([place]) 04/23/2016   a. 04/2016 Echo: EF 45%, Gr2 DD; b. 11/2019 Echo: EF 45-50%, mod LVH. Gr2 DD. Nl RV fxn. Sev dil LA.   Marland Kitchen Complication of anesthesia   . Essential hypertension   . Hip pain   . Hyperlipidemia   . Hypothyroidism   . MI (myocardial infarction) ([place]) 06/05/2015  . PONV (postoperative nausea and vomiting)   . Vitamin D deficiency     Past Surgical History:  Procedure Laterality Date  . ABDOMINAL HYSTERECTOMY    . BIOPSY  09/17/2020   Procedure: BIOPSY;  Surgeon: [name], DO;  Location: [facility];  Service: Endoscopy;;  . CARDIAC CATHETERIZATION N/A 06/06/2015   Procedure: Left Heart Cath and Coronary Angiography;  Surgeon: Peter M Martinique, MD;  Location: [facility];  Service: Cardiovascular;  Laterality: N/A;  . CARDIOVERSION N/A 09/14/2020   Procedure: CARDIOVERSION;  Surgeon: Satira Sark, MD;  Location: [facility];  Service: Cardiovascular;  Laterality: N/A;  . CHOLECYSTECTOMY    . COLONOSCOPY    . COLONOSCOPY N/A 08/13/2017   Procedure: COLONOSCOPY;  Surgeon: Daneil Dolin, MD;  Location[facility];  Service: Endoscopy;  Laterality: N/A;  8:30 AM  . ESOPHAGOGASTRODUODENOSCOPY N/A 09/11/2016   Procedure: ESOPHAGOGASTRODUODENOSCOPY (EGD);  Surgeon: Daneil Dolin, MD;  Location: [facility];  Service: Endoscopy;  Laterality: N/A;  7:45 am - moved to 10/11 @ 10:30  . ESOPHAGOGASTRODUODENOSCOPY (EGD) WITH PROPOFOL N/A 09/17/2020   [name]: Diffuse moderate inflammation characterized by erosions and erythema found in the entire stomach.  Biopsies positive for H. pylori.  Patient has not been treated due to drug allergies.  Marland Kitchen Heel spurs    . MALONEY DILATION N/A 09/11/2016   Procedure: Venia Minks DILATION;  Surgeon: Daneil Dolin, MD;  Location[facility];  Service: Endoscopy;  Laterality: N/A;  . POLYPECTOMY  08/13/2017   Procedure: POLYPECTOMY;  Surgeon: Daneil Dolin, MD;  Location[facility];  Service: Endoscopy;;  colon  . SHOULDER ARTHROSCOPY    . TUBAL LIGATION      Current Outpatient Medications  Medication Sig Dispense Refill  .  acetaminophen (TYLENOL) 500 MG tablet Take 500 mg by mouth every 6 (six) hours as needed for mild pain or moderate pain. Reported on 04/22/2016    . apixaban (ELIQUIS) 5 MG TABS tablet Take 1 tablet (5 mg total) by mouth 2 (two) times daily. 60 tablet 11  . atorvastatin (LIPITOR) 80 MG tablet Take 1 tablet (80 mg total) by mouth at bedtime. 90 tablet 3  . calcium carbonate (CALCIUM 600) 600 MG TABS tablet Take 1  tablet (600 mg total) by mouth 2 (two) times daily with a meal. 60 tablet 6  . Cholecalciferol (VITAMIN D) 50 MCG (2000 UT) CAPS Take 1 tablet daily 30 capsule 2  . dofetilide (TIKOSYN) 125 MCG capsule Take 1 capsule (125 mcg total) by mouth 2 (two) times daily. 60 capsule 6  . ferrous sulfate 325 (65 FE) MG EC tablet Take 325 mg by mouth daily.    . furosemide (LASIX) 40 MG tablet Take 40 mg twice a day 4 days/week (Tuesday, Thursday, Saturday, Sunday) and take 40 mg  daily 3 days/week (Monday, Wednesday and Friday) 120 tablet 6  . gabapentin (NEURONTIN) 300 MG capsule Take 300 mg by mouth 2 (two) times daily.     Marland Kitchen levothyroxine (SYNTHROID) 100 MCG tablet TAKE 1 TABLET BY MOUTH ONCE DAILY BEFORE BREAKFAST 90 tablet 2  . loratadine (CLARITIN) 10 MG tablet Take 10 mg by mouth daily.    . magnesium oxide (MAG-OX) 400 MG tablet Take 0.5 tablets (200 mg total) by mouth daily. 30 tablet 6  . metoprolol tartrate (LOPRESSOR) 25 MG tablet Take 25 mg by mouth 2 (two) times daily.    . nitroGLYCERIN (NITROSTAT) 0.4 MG SL tablet DISSOLVE ONE TABLET UNDER THE TONGUE EVERY 5 MINUTES AS NEEDED FOR CHEST PAIN.  DO NOT EXCEED A TOTAL OF 3 DOSES IN 15 MINUTES (Patient taking differently: DISSOLVE ONE TABLET UNDER THE TONGUE EVERY 5 MINUTES AS NEEDED FOR CHEST PAIN.  DO NOT EXCEED A TOTAL OF 3 DOSES IN 15 MINUTES) 25 tablet 3  . potassium chloride SA (KLOR-CON) 20 MEQ tablet Take 2 tablets (40 mEq total) by mouth daily. 90 tablet 6  . RANEXA 500 MG 12 hr tablet Take 1 tablet by mouth twice daily 60 tablet 0   No current facility-administered medications for this visit.   Allergies:  Lisinopril, Ranolazine, Vicodin [hydrocodone-acetaminophen], Amoxicillin, Demerol [meperidine], and Lodine [etodolac]   Social History: The patient  reports that she quit smoking about 18 years ago. Her smoking use included cigarettes. She started smoking about 43 years ago. She has a 20.00 pack-year smoking history. She has never used  smokeless tobacco. She reports that she does not drink alcohol and does not use drugs.   Family History: The patient's family history includes Cancer in her brother; Heart attack in her brother and father; Heart failure in her brother and father; Stroke in her sister.   ROS:  Please see the history of present illness. Otherwise, complete review of systems is positive for none.  All other systems are reviewed and negative.   Physical Exam: VS:  BP 140/82   Pulse 72   Ht 5' (1.524 m)   Wt 216 lb 3.2 oz (98.1 kg)   SpO2 98%   BMI 42.22 kg/m , BMI Body mass index is 42.22 kg/m.  Wt Readings from Last 3 Encounters:  01/05/21 216 lb 3.2 oz (98.1 kg)  12/22/20 212 lb 6.4 oz (96.3 kg)  11/08/20 210 lb (95.3 kg)  General: Patient appears comfortable at rest. Neck: Supple, no elevated JVP or carotid bruits, no thyromegaly. Lungs: Clear to auscultation, nonlabored breathing at rest. Cardiac: Regular rate and rhythm, no S3 or significant systolic murmur, no pericardial rub. Extremities: No pitting edema, distal pulses 2+. Skin: Warm and dry. Musculoskeletal: No kyphosis. Neuropsychiatric: Alert and oriented x3, affect grossly appropriate.  ECG:  An ECG dated 10/02/2020 was personally reviewed today and demonstrated:  Atrial fibrillation with incomplete left bundle branch block rate of 73 ST and T wave abnormality consider inferior lateral ischemia  Recent Labwork: 09/15/2020: B Natriuretic Peptide 1,278.0 09/16/2020: ALT 31; AST 33; TSH 3.395 11/30/2020: Hemoglobin 11.0; Platelets 257 12/22/2020: BUN 17; Creatinine, Ser 1.03; Magnesium 2.1; Potassium CANCELED; Sodium 142     Component Value Date/Time   CHOL 123 01/03/2020 0948   TRIG 68 01/03/2020 0948   HDL 46 (L) 01/03/2020 0948   CHOLHDL 2.7 01/03/2020 0948   VLDL 12 06/23/2017 0902   LDLCALC 63 01/03/2020 0948    Other Studies Reviewed Today:  Echocardiogram 08/29/2020 1. Left ventricular ejection fraction, by estimation, is  40 to 45%. The left ventricle has mildly decreased function. The left ventricle demonstrates global hypokinesis. There is severe left ventricular hypertrophy. Left ventricular diastolic parameters are indeterminate. 2. Right ventricular systolic function is normal. The right ventricular size is normal. There is moderately elevated pulmonary artery systolic pressure. 3. Left atrial size was severely dilated. 4. Right atrial size was severely dilated. 5. The mitral valve is normal in structure. Mild to moderate mitral valve regurgitation. No evidence of mitral stenosis. 6. Tricuspid valve regurgitation is moderate. 7. The aortic valve is tricuspid. Aortic valve regurgitation is mild to moderate. 8. Moderate pulmonary HTN, PASP is 50 mmHg. 9. The inferior vena cava is dilated in size with <50% respiratory variability, suggesting right atrial pressure of 15 mmHg.   ECHO: 01/14/2020 1. Left ventricular ejection fraction, by estimation, is 60 to 65%. The  left ventricle has normal function. The left ventricle has no regional  wall motion abnormalities. There is severely increased left ventricular  hypertrophy. Left ventricular  diastolic parameters are consistent with Grade I diastolic dysfunction  (impaired relaxation).  2. Right ventricular systolic function is normal. The right ventricular  size is normal. There is mildly elevated pulmonary artery systolic  pressure.  3. Left atrial size was severely dilated.  4. Right atrial size was severely dilated.  5. The mitral valve is normal in structure and function. Mild mitral  valve regurgitation. No evidence of mitral stenosis.  6. The aortic valve is tricuspid. Aortic valve regurgitation is not  visualized. No aortic stenosis is present.  7. The inferior vena cava is normal in size with <50% respiratory  variability, suggesting right atrial pressure of 8 mmHg.   CATH: 06/06/2015  Dist LAD lesion, 100% stenosed.  Mid LAD  lesion, 10% stenosed.  The left ventricular systolic function is normal.  1. 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 Good LV function  Plan: DAPT, beta blocker and risk factor modification.   Assessment and Plan:   1.  Preop clearance She has a pending colonoscopy with Dr. Gala Romney on 01/18/2021.  Hold Eliquis 2 days prior to this procedure.  Revised cardiac risk index = 2 placing the patient at perioperative risk of major cardiac event at 6.6%.  Patient is cleared to undergo colonoscopy from a cardiac standpoint     2. Chronic combined systolic (congestive) and diastolic (congestive) heart failure ([place]) Recent  echocardiogram on 08/09/2020 showed EF 40 to 45%, LV global hypokinesis, severe LVH, moderately elevated pulmonary artery systolic pressure with a PASP of 50 mmHg.  Severe LA dilation, severe RA dilation, mild to moderate MR, moderate TR.  AR mild to moderate.  Continue furosemide 40 mg twice a day Tuesday, Thursday, Saturday.  Take 40 mg 3 days/week on Mondays, Wednesdays and Fridays.  Continue metoprolol 25 mg p.o. twice daily.  Please get follow-up echocardiogram  3. Atrial fibrillation, unspecified type ([place]) Had a recent cardioversion on 09/04/2020 and after 2 shocks converted to normal sinus rhythm.  Heart rate is 72 and regular today.  She denies any palpitations.  Patient would like definitive therapy to resolve atrial fibrillation.  Continue Eliquis 5 mg p.o. twice daily.  Continue metoprolol 25 mg p.o. twice daily.  Continue Tikosyn 125 mcg p.o. twice daily  4. Suspected sleep apnea Please refer to pulmonology for sleep apnea suspicion.  A further referral was made at last visit but appears no one has contacted her.  We will follow-up to make sure she has an appointment for sleep study.  Patient states she plans to call and schedule her sleep study this afternoon.  5. CAD in native artery She denies any anginal or exertional symptoms.  Continue nitroglycerin  sublingual as needed.  Continue metoprolol 25 mg p.o. twice daily.  Continue atorvastatin 80 mg p.o. twice daily.  6. Hypothyroidism, unspecified type Continue Synthroid 100 mcg p.o. twice daily  Medication Adjustments/Labs and Tests Ordered: Current medicines are reviewed at length with the patient today.  Concerns regarding medicines are outlined above.   Disposition: Follow-up with Dr. Domenic Polite or APP 6 months Signed, Levell July, NP 01/05/2021 9:25 AM    [facility] [address], [place], [place] [address] Phone: [phone]; Fax: [phone]

## 2021-01-05 ENCOUNTER — Encounter: Payer: Self-pay | Admitting: Family Medicine

## 2021-01-05 ENCOUNTER — Telehealth: Payer: Self-pay | Admitting: *Deleted

## 2021-01-05 ENCOUNTER — Ambulatory Visit: Payer: PPO | Admitting: Family Medicine

## 2021-01-05 VITALS — BP 140/82 | HR 72 | Ht 60.0 in | Wt 216.2 lb

## 2021-01-05 DIAGNOSIS — I4819 Other persistent atrial fibrillation: Secondary | ICD-10-CM | POA: Diagnosis not present

## 2021-01-05 DIAGNOSIS — R0683 Snoring: Secondary | ICD-10-CM

## 2021-01-05 DIAGNOSIS — I2510 Atherosclerotic heart disease of native coronary artery without angina pectoris: Secondary | ICD-10-CM

## 2021-01-05 DIAGNOSIS — I5042 Chronic combined systolic (congestive) and diastolic (congestive) heart failure: Secondary | ICD-10-CM

## 2021-01-05 DIAGNOSIS — Z01818 Encounter for other preprocedural examination: Secondary | ICD-10-CM | POA: Diagnosis not present

## 2021-01-05 DIAGNOSIS — I10 Essential (primary) hypertension: Secondary | ICD-10-CM | POA: Diagnosis not present

## 2021-01-05 MED ORDER — RANEXA 500 MG PO TB12: 500.0000 mg | ORAL_TABLET | Freq: Two times a day (BID) | ORAL | 6 refills | Status: DC

## 2021-01-05 NOTE — Patient Instructions (Addendum)

## 2021-01-05 NOTE — Telephone Encounter (Signed)
Patient informed and verbalized understanding of plan. 

## 2021-01-05 NOTE — Telephone Encounter (Signed)
-----  Message from Satira Sark, MD sent at 01/05/2021  9:58 AM EST ----- I have not met this patient.  She has a cardiomyopathy with LVEF 40 to 45% as of September 2021.  If she has been in sinus rhythm since cardioversion, probably ought to go ahead and set her up for an echocardiogram to reassess cardiac function.  This would not necessarily hold up colonoscopy however as that would generally be low risk.  I see that Dr. Rayann Heman said 6 months for an echocardiogram in his recent visit, but I think that is too long. ----- Message ----- From: Verta Ellen., NP Sent: 01/05/2021   9:52 AM EST To: Satira Sark, MD

## 2021-01-05 NOTE — Progress Notes (Signed)
OK. Will order

## 2021-01-08 ENCOUNTER — Other Ambulatory Visit: Payer: Self-pay

## 2021-01-08 MED ORDER — NITROGLYCERIN 0.4 MG SL SUBL: SUBLINGUAL_TABLET | SUBLINGUAL | 3 refills | Status: DC

## 2021-01-08 NOTE — Telephone Encounter (Signed)
Medication refill request approved and sent to [facility]

## 2021-01-08 NOTE — Telephone Encounter (Signed)
Please see cardiology note from 01/05/21 regarding cardiac clearance. OK to proceed with colonoscopy.

## 2021-01-09 ENCOUNTER — Other Ambulatory Visit: Payer: Self-pay

## 2021-01-09 DIAGNOSIS — E890 Postprocedural hypothyroidism: Secondary | ICD-10-CM

## 2021-01-09 NOTE — Telephone Encounter (Signed)
Noted  

## 2021-01-09 NOTE — Telephone Encounter (Signed)
Noted, procedure scheduled for 01/18/21.

## 2021-01-10 DIAGNOSIS — E890 Postprocedural hypothyroidism: Secondary | ICD-10-CM | POA: Diagnosis not present

## 2021-01-11 LAB — T4, FREE: Free T4: 1.66 ng/dL (ref 0.82–1.77)

## 2021-01-11 LAB — TSH: TSH: 4.01 u[IU]/mL (ref 0.450–4.500)

## 2021-01-15 DIAGNOSIS — A048 Other specified bacterial intestinal infections: Secondary | ICD-10-CM | POA: Diagnosis not present

## 2021-01-15 NOTE — Patient Instructions (Addendum)
Maria Fry  01/15/2021     @PREFPERIOPPHARMACY @   Your procedure is scheduled on  01/18/2021   Report to [facility] at  [email or web address].   Call this number if you have problems the morning of surgery:  [phone]   Remember:  Follow the diet and prep instructions given to you by the office.                      Take these medicines the morning of surgery with A SIP OF WATER   Tikosyn, gabapentin, levothyroxine, claritin, metoprolol, ranexa.      Please brush your teeth.  Do not wear jewelry, make-up or nail polish.  Do not wear lotions, powders, or perfumes, or deodorant.  Do not shave 48 hours prior to surgery.  Men may shave face and neck.  Do not bring valuables to the hospital.  [facility] is not responsible for any belongings or valuables.  Contacts, dentures or bridgework may not be worn into surgery.  Leave your suitcase in the car.  After surgery it may be brought to your room.  For patients admitted to the hospital, discharge time will be determined by your treatment team.  Patients discharged the day of surgery will not be allowed to drive home and must have someone with them for 24 hours.   Special instructions:  DO NOT smoke tobacco or vape the morning of your procedure.  Please read over the following fact sheets that you were given. Anesthesia Post-op Instructions and Care and Recovery After Surgery       Colonoscopy, Adult, Care After This sheet gives you information about how to care for yourself after your procedure. Your health care provider may also give you more specific instructions. If you have problems or questions, contact your health care provider. What can I expect after the procedure? After the procedure, it is common to have:  A small amount of blood in your stool for 24 hours after the procedure.  Some gas.  Mild cramping or bloating of your abdomen. Follow these instructions at home: Eating and drinking  Drink enough fluid  to keep your urine pale yellow.  Follow instructions from your health care provider about eating or drinking restrictions.  Resume your normal diet as instructed by your health care provider. Avoid heavy or fried foods that are hard to digest.   Activity  Rest as told by your health care provider.  Avoid sitting for a long time without moving. Get up to take short walks every 1-2 hours. This is important to improve blood flow and breathing. Ask for help if you feel weak or unsteady.  Return to your normal activities as told by your health care provider. Ask your health care provider what activities are safe for you. Managing cramping and bloating  Try walking around when you have cramps or feel bloated.  Apply heat to your abdomen as told by your health care provider. Use the heat source that your health care provider recommends, such as a moist heat pack or a heating pad. ? Place a towel between your skin and the heat source. ? Leave the heat on for 20-30 minutes. ? Remove the heat if your skin turns bright red. This is especially important if you are unable to feel pain, heat, or cold. You may have a greater risk of getting burned.   General instructions  If you were given a sedative during  the procedure, it can affect you for several hours. Do not drive or operate machinery until your health care provider says that it is safe.  For the first 24 hours after the procedure: ? Do not sign important documents. ? Do not drink alcohol. ? Do your regular daily activities at a slower pace than normal. ? Eat soft foods that are easy to digest.  Take over-the-counter and prescription medicines only as told by your health care provider.  Keep all follow-up visits as told by your health care provider. This is important. Contact a health care provider if:  You have blood in your stool 2-3 days after the procedure. Get help right away if you have:  More than a small spotting of blood in  your stool.  Large blood clots in your stool.  Swelling of your abdomen.  Nausea or vomiting.  A fever.  Increasing pain in your abdomen that is not relieved with medicine. Summary  After the procedure, it is common to have a small amount of blood in your stool. You may also have mild cramping and bloating of your abdomen.  If you were given a sedative during the procedure, it can affect you for several hours. Do not drive or operate machinery until your health care provider says that it is safe.  Get help right away if you have a lot of blood in your stool, nausea or vomiting, a fever, or increased pain in your abdomen. This information is not intended to replace advice given to you by your health care provider. Make sure you discuss any questions you have with your health care provider. Document Revised: 11/12/2019 Document Reviewed: 06/14/2019 Elsevier Patient Education  2021 [employer] After This sheet gives you information about how to care for yourself after your procedure. Your health care provider may also give you more specific instructions. If you have problems or questions, contact your health care provider. What can I expect after the procedure? After the procedure, it is common to have:  Tiredness.  Forgetfulness about what happened after the procedure.  Impaired judgment for important decisions.  Nausea or vomiting.  Some difficulty with balance. Follow these instructions at home: For the time period you were told by your health care provider:  Rest as needed.  Do not participate in activities where you could fall or become injured.  Do not drive or use machinery.  Do not drink alcohol.  Do not take sleeping pills or medicines that cause drowsiness.  Do not make important decisions or sign legal documents.  Do not take care of children on your own.      Eating and drinking  Follow the diet that is recommended  by your health care provider.  Drink enough fluid to keep your urine pale yellow.  If you vomit: ? Drink water, juice, or soup when you can drink without vomiting. ? Make sure you have little or no nausea before eating solid foods. General instructions  Have a responsible adult stay with you for the time you are told. It is important to have someone help care for you until you are awake and alert.  Take over-the-counter and prescription medicines only as told by your health care provider.  If you have sleep apnea, surgery and certain medicines can increase your risk for breathing problems. Follow instructions from your health care provider about wearing your sleep device: ? Anytime you are sleeping, including during daytime naps. ? While taking  prescription pain medicines, sleeping medicines, or medicines that make you drowsy.  Avoid smoking.  Keep all follow-up visits as told by your health care provider. This is important. Contact a health care provider if:  You keep feeling nauseous or you keep vomiting.  You feel light-headed.  You are still sleepy or having trouble with balance after 24 hours.  You develop a rash.  You have a fever.  You have redness or swelling around the IV site. Get help right away if:  You have trouble breathing.  You have new-onset confusion at home. Summary  For several hours after your procedure, you may feel tired. You may also be forgetful and have poor judgment.  Have a responsible adult stay with you for the time you are told. It is important to have someone help care for you until you are awake and alert.  Rest as told. Do not drive or operate machinery. Do not drink alcohol or take sleeping pills.  Get help right away if you have trouble breathing, or if you suddenly become confused. This information is not intended to replace advice given to you by your health care provider. Make sure you discuss any questions you have with your  health care provider. Document Revised: 08/03/2020 Document Reviewed: 10/21/2019 Elsevier Patient Education  2021 [employer].

## 2021-01-16 ENCOUNTER — Other Ambulatory Visit (HOSPITAL_COMMUNITY)
Admission: RE | Admit: 2021-01-16 | Discharge: 2021-01-16 | Disposition: A | Discharge status: Home / Self Care | Payer: PPO | Source: Ambulatory Visit | Attending: Internal Medicine | Admitting: Internal Medicine

## 2021-01-16 ENCOUNTER — Encounter (HOSPITAL_COMMUNITY): Payer: Self-pay

## 2021-01-16 ENCOUNTER — Other Ambulatory Visit: Payer: Self-pay

## 2021-01-16 ENCOUNTER — Encounter (HOSPITAL_COMMUNITY)
Admission: RE | Admit: 2021-01-16 | Discharge: 2021-01-16 | Disposition: A | Discharge status: Home / Self Care | Payer: PPO | Source: Ambulatory Visit | Attending: Internal Medicine | Admitting: Internal Medicine

## 2021-01-16 DIAGNOSIS — Z20822 Contact with and (suspected) exposure to covid-19: Secondary | ICD-10-CM | POA: Diagnosis not present

## 2021-01-16 DIAGNOSIS — Z01812 Encounter for preprocedural laboratory examination: Secondary | ICD-10-CM | POA: Diagnosis not present

## 2021-01-16 HISTORY — DX: Unspecified osteoarthritis, unspecified site: M19.90

## 2021-01-16 HISTORY — DX: Cardiac arrhythmia, unspecified: I49.9

## 2021-01-16 LAB — HELICOBACTER PYLORI  SPECIAL ANTIGEN
MICRO NUMBER:: 11532278
SPECIMEN QUALITY: ADEQUATE

## 2021-01-16 LAB — SARS CORONAVIRUS 2 (TAT 6-24 HRS): SARS Coronavirus 2: NEGATIVE

## 2021-01-16 LAB — BASIC METABOLIC PANEL
Anion gap: 6 (ref 5–15)
BUN: 19 mg/dL (ref 8–23)
CO2: 27 mmol/L (ref 22–32)
Calcium: 9.1 mg/dL (ref 8.9–10.3)
Chloride: 104 mmol/L (ref 98–111)
Creatinine, Ser: 0.89 mg/dL (ref 0.44–1.00)
GFR, Estimated: >60 mL/min (ref >60)
Glucose, Bld: 86 mg/dL (ref 70–99)
Potassium: 4.2 mmol/L (ref 3.5–5.1)
Sodium: 137 mmol/L (ref 135–145)

## 2021-01-18 ENCOUNTER — Encounter (HOSPITAL_COMMUNITY)
Admission: RE | Disposition: A | Discharge status: Home / Self Care | Payer: Self-pay | Source: Home / Self Care | Attending: Internal Medicine

## 2021-01-18 ENCOUNTER — Ambulatory Visit (HOSPITAL_COMMUNITY): Payer: PPO | Admitting: Anesthesiology

## 2021-01-18 ENCOUNTER — Other Ambulatory Visit: Payer: Self-pay

## 2021-01-18 ENCOUNTER — Encounter (HOSPITAL_COMMUNITY): Payer: Self-pay | Admitting: Internal Medicine

## 2021-01-18 ENCOUNTER — Ambulatory Visit (HOSPITAL_COMMUNITY)
Admission: RE | Admit: 2021-01-18 | Discharge: 2021-01-18 | Disposition: A | Discharge status: Home / Self Care | Payer: PPO | Attending: Internal Medicine | Admitting: Internal Medicine

## 2021-01-18 DIAGNOSIS — Z86010 Personal history of colonic polyps: Secondary | ICD-10-CM | POA: Insufficient documentation

## 2021-01-18 DIAGNOSIS — D122 Benign neoplasm of ascending colon: Secondary | ICD-10-CM | POA: Diagnosis not present

## 2021-01-18 DIAGNOSIS — Z880 Allergy status to penicillin: Secondary | ICD-10-CM | POA: Diagnosis not present

## 2021-01-18 DIAGNOSIS — Q438 Other specified congenital malformations of intestine: Secondary | ICD-10-CM | POA: Diagnosis not present

## 2021-01-18 DIAGNOSIS — D509 Iron deficiency anemia, unspecified: Secondary | ICD-10-CM | POA: Diagnosis not present

## 2021-01-18 DIAGNOSIS — Z79890 Hormone replacement therapy (postmenopausal): Secondary | ICD-10-CM | POA: Insufficient documentation

## 2021-01-18 DIAGNOSIS — Z888 Allergy status to other drugs, medicaments and biological substances status: Secondary | ICD-10-CM | POA: Diagnosis not present

## 2021-01-18 DIAGNOSIS — D120 Benign neoplasm of cecum: Secondary | ICD-10-CM | POA: Insufficient documentation

## 2021-01-18 DIAGNOSIS — K635 Polyp of colon: Secondary | ICD-10-CM

## 2021-01-18 DIAGNOSIS — Z79899 Other long term (current) drug therapy: Secondary | ICD-10-CM | POA: Insufficient documentation

## 2021-01-18 DIAGNOSIS — Z7901 Long term (current) use of anticoagulants: Secondary | ICD-10-CM | POA: Diagnosis not present

## 2021-01-18 DIAGNOSIS — Z1211 Encounter for screening for malignant neoplasm of colon: Secondary | ICD-10-CM | POA: Insufficient documentation

## 2021-01-18 DIAGNOSIS — Z87891 Personal history of nicotine dependence: Secondary | ICD-10-CM | POA: Insufficient documentation

## 2021-01-18 DIAGNOSIS — D128 Benign neoplasm of rectum: Secondary | ICD-10-CM | POA: Diagnosis not present

## 2021-01-18 DIAGNOSIS — K621 Rectal polyp: Secondary | ICD-10-CM | POA: Diagnosis not present

## 2021-01-18 DIAGNOSIS — Z885 Allergy status to narcotic agent status: Secondary | ICD-10-CM | POA: Insufficient documentation

## 2021-01-18 DIAGNOSIS — Z8719 Personal history of other diseases of the digestive system: Secondary | ICD-10-CM | POA: Insufficient documentation

## 2021-01-18 DIAGNOSIS — I25118 Atherosclerotic heart disease of native coronary artery with other forms of angina pectoris: Secondary | ICD-10-CM | POA: Diagnosis not present

## 2021-01-18 HISTORY — PX: POLYPECTOMY: SHX5525

## 2021-01-18 HISTORY — PX: COLONOSCOPY WITH PROPOFOL: SHX5780

## 2021-01-18 SURGERY — COLONOSCOPY WITH PROPOFOL
Anesthesia: General

## 2021-01-18 MED ORDER — LACTATED RINGERS IV SOLN: INTRAVENOUS | Status: DC

## 2021-01-18 MED ORDER — PROPOFOL 500 MG/50ML IV EMUL
INTRAVENOUS | Status: DC | PRN
  Administered 2021-01-18 (×2): 100 ug/kg/min via INTRAVENOUS

## 2021-01-18 MED ORDER — PROPOFOL 10 MG/ML IV BOLUS
INTRAVENOUS | Status: DC | PRN
  Administered 2021-01-18: 100 mg via INTRAVENOUS

## 2021-01-18 NOTE — Discharge Instructions (Signed)
Colonoscopy Discharge Instructions  Read the instructions outlined below and refer to this sheet in the next few weeks. These discharge instructions provide you with general information on caring for yourself after you leave the hospital. Your doctor may also give you specific instructions. While your treatment has been planned according to the most current medical practices available, unavoidable complications occasionally occur. If you have any problems or questions after discharge, call Dr. Gala Romney at [phone]. ACTIVITY  You may resume your regular activity, but move at a slower pace for the next 24 hours.   Take frequent rest periods for the next 24 hours.   Walking will help get rid of the air and reduce the bloated feeling in your belly (abdomen).   No driving for 24 hours (because of the medicine (anesthesia) used during the test).    Do not sign any important legal documents or operate any machinery for 24 hours (because of the anesthesia used during the test).  NUTRITION  Drink plenty of fluids.   You may resume your normal diet as instructed by your doctor.   Begin with a light meal and progress to your normal diet. Heavy or fried foods are harder to digest and may make you feel sick to your stomach (nauseated).   Avoid alcoholic beverages for 24 hours or as instructed.  MEDICATIONS  You may resume your normal medications unless your doctor tells you otherwise.  WHAT YOU CAN EXPECT TODAY  Some feelings of bloating in the abdomen.   Passage of more gas than usual.   Spotting of blood in your stool or on the toilet paper.  IF YOU HAD POLYPS REMOVED DURING THE COLONOSCOPY:  No aspirin products for 7 days or as instructed.   No alcohol for 7 days or as instructed.   Eat a soft diet for the next 24 hours.  FINDING OUT THE RESULTS OF YOUR TEST Not all test results are available during your visit. If your test results are not back during the visit, make an appointment  with your caregiver to find out the results. Do not assume everything is normal if you have not heard from your caregiver or the medical facility. It is important for you to follow up on all of your test results.  SEEK IMMEDIATE MEDICAL ATTENTION IF:  You have more than a spotting of blood in your stool.   Your belly is swollen (abdominal distention).   You are nauseated or vomiting.   You have a temperature over 101.   You have abdominal pain or discomfort that is severe or gets worse throughout the day.    You had multiple polyps removed from your colon today  Resume Eliquis tomorrow  Further recommendations to follow pending review of pathology report  Office visit with [place] in 2 months  At patient request, I called Marcial Pacas at [phone] -reviewed findings and recommendations.  PATIENT INSTRUCTIONS POST-ANESTHESIA  IMMEDIATELY FOLLOWING SURGERY:  Do not drive or operate machinery for the first twenty four hours after surgery.  Do not make any important decisions for twenty four hours after surgery or while taking narcotic pain medications or sedatives.  If you develop intractable nausea and vomiting or a severe headache please notify your doctor immediately.  FOLLOW-UP:  Please make an appointment with your surgeon as instructed. You do not need to follow up with anesthesia unless specifically instructed to do so.  WOUND CARE INSTRUCTIONS (if applicable):  Keep a dry clean dressing on the anesthesia/puncture wound site  if there is drainage.  Once the wound has quit draining you may leave it open to air.  Generally you should leave the bandage intact for twenty four hours unless there is drainage.  If the epidural site drains for more than 36-48 hours please call the anesthesia department.  QUESTIONS?:  Please feel free to call your physician or the hospital operator if you have any questions, and they will be happy to assist you.         Colon Polyps  Colon polyps  are tissue growths inside the colon, which is part of the large intestine. They are one of the types of polyps that can grow in the body. A polyp may be a round bump or a mushroom-shaped growth. You could have one polyp or more than one. Most colon polyps are noncancerous (benign). However, some colon polyps can become cancerous over time. Finding and removing the polyps early can help prevent this. What are the causes? The exact cause of colon polyps is not known. What increases the risk? The following factors may make you more likely to develop this condition:  Having a family history of colorectal cancer or colon polyps.  Being older than 68 years of age.  Being younger than 68 years of age and having a significant family history of colorectal cancer or colon polyps or a genetic condition that puts you at higher risk of getting colon polyps.  Having inflammatory bowel disease, such as ulcerative colitis or Crohn's disease.  Having certain conditions passed from parent to child (hereditary conditions), such as: ? Familial adenomatous polyposis (FAP). ? Lynch syndrome. ? Turcot syndrome. ? Peutz-Jeghers syndrome. ? MUTYH-associated polyposis (MAP).  Being overweight.  Certain lifestyle factors. These include smoking cigarettes, drinking too much alcohol, not getting enough exercise, and eating a diet that is high in fat and red meat and low in fiber.  Having had childhood cancer that was treated with radiation of the abdomen. What are the signs or symptoms? Many times, there are no symptoms. If you have symptoms, they may include:  Blood coming from the rectum during a bowel movement.  Blood in the stool (feces). The blood may be bright red or very dark in color.  Pain in the abdomen.  A change in bowel habits, such as constipation or diarrhea. How is this diagnosed? This condition is diagnosed with a colonoscopy. This is a procedure in which a lighted, flexible scope is  inserted into the opening between the buttocks (anus) and then passed into the colon to examine the area. Polyps are sometimes found when a colonoscopy is done as part of routine cancer screening tests. How is this treated? This condition is treated by removing any polyps that are found. Most polyps can be removed during a colonoscopy. Those polyps will then be tested for cancer. Additional treatment may be needed depending on the results of testing. Follow these instructions at home: Eating and drinking  Eat foods that are high in fiber, such as fruits, vegetables, and whole grains.  Eat foods that are high in calcium and vitamin D, such as milk, cheese, yogurt, eggs, liver, fish, and broccoli.  Limit foods that are high in fat, such as fried foods and desserts.  Limit the amount of red meat, precooked or cured meat, or other processed meat that you eat, such as hot dogs, sausages, bacon, or meat loaves.  Limit sugary drinks.   Lifestyle  Maintain a healthy weight, or lose weight if recommended by  your health care provider.  Exercise every day or as told by your health care provider.  Do not use any products that contain nicotine or tobacco, such as cigarettes, e-cigarettes, and chewing tobacco. If you need help quitting, ask your health care provider.  Do not drink alcohol if: ? Your health care provider tells you not to drink. ? You are pregnant, may be pregnant, or are planning to become pregnant.  If you drink alcohol: ? Limit how much you use to:  0-1 drink a day for women.  0-2 drinks a day for men. ? Know how much alcohol is in your drink. In the [place] one drink equals one 12 oz bottle of beer (355 mL), one 5 oz glass of wine (148 mL), or one 1 oz glass of hard liquor (44 mL). General instructions  Take over-the-counter and prescription medicines only as told by your health care provider.  Keep all follow-up visits. This is important. This includes having regularly  scheduled colonoscopies. Talk to your health care provider about when you need a colonoscopy. Contact a health care provider if:  You have new or worsening bleeding during a bowel movement.  You have new or increased blood in your stool.  You have a change in bowel habits.  You lose weight for no known reason. Summary  Colon polyps are tissue growths inside the colon, which is part of the large intestine. They are one type of polyp that can grow in the body.  Most colon polyps are noncancerous (benign), but some can become cancerous over time.  This condition is diagnosed with a colonoscopy.  This condition is treated by removing any polyps that are found. Most polyps can be removed during a colonoscopy. This information is not intended to replace advice given to you by your health care provider. Make sure you discuss any questions you have with your health care provider. Document Revised: 03/08/2020 Document Reviewed: 03/08/2020 Elsevier Patient Education  2021 [employer].

## 2021-01-18 NOTE — H&P (Signed)
@LOGO @   Primary Care Physician:  Alycia Rossetti, MD Primary Gastroenterologist:  Dr.   Pre-Procedure History & Physical: HPI:  Maria Fry is a 70 y.o. female here for further evaluation of iron deficiency anemia.  Chronically anticoagulated.  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  Seen by cardiology recently cleared for colonoscopy.  Eliquis stopped 2 days ago per plan.  Past Medical History:  Diagnosis Date  . Arthritis   . Atrial fibrillation ([place])   . CAD (coronary artery disease)    a. 06/07/15 NSTEMI/Cath: Dist LAD 100%, mid LAD 10%. Otw nl cors. Nl EF w/ inferoapical AK.  Marland Kitchen Chronic combined systolic (congestive) and diastolic (congestive) heart failure ([place]) 04/23/2016   a. 04/2016 Echo: EF 45%, Gr2 DD; b. 11/2019 Echo: EF 45-50%, mod LVH. Gr2 DD. Nl RV fxn. Sev dil LA.   Marland Kitchen Complication of anesthesia   . Dysrhythmia    AFib  . Essential hypertension   . Hip pain   . Hyperlipidemia   . Hypothyroidism   . MI (myocardial infarction) ([place]) 06/05/2015  . PONV (postoperative nausea and vomiting)   . Vitamin D deficiency     Past Surgical History:  Procedure Laterality Date  . ABDOMINAL HYSTERECTOMY    . BIOPSY  09/17/2020   Procedure: BIOPSY;  Surgeon: [name], DO;  [facility];  Service: Endoscopy;;  . CARDIAC CATHETERIZATION N/A 06/06/2015   Procedure: Left Heart Cath and Coronary Angiography;  Surgeon: Peter M Martinique, MD;  Location: [facility];  Service: Cardiovascular;  Laterality: N/A;  . CARDIOVERSION N/A 09/14/2020   Procedure: CARDIOVERSION;  Surgeon: Satira Sark, MD;  Location: [facility];  Service: Cardiovascular;  Laterality: N/A;  . CHOLECYSTECTOMY    . COLONOSCOPY    . COLONOSCOPY N/A 08/13/2017   Procedure: COLONOSCOPY;  Surgeon: Daneil Dolin, MD;  [facility];  Service: Endoscopy;  Laterality: N/A;  8:30 AM  . ESOPHAGOGASTRODUODENOSCOPY N/A 09/11/2016   Procedure:  ESOPHAGOGASTRODUODENOSCOPY (EGD);  Surgeon: Daneil Dolin, MD;  [facility]: [facility];  Service: Endoscopy;  Laterality: N/A;  7:45 am - moved to 10/11 @ 10:30  . ESOPHAGOGASTRODUODENOSCOPY (EGD) WITH PROPOFOL N/A 09/17/2020   [name]: Diffuse moderate inflammation characterized by erosions and erythema found in the entire stomach.  Biopsies positive for H. pylori.  Patient has not been treated due to drug allergies.  Marland Kitchen Heel spurs Left   . MALONEY DILATION N/A 09/11/2016   Procedure: Venia Minks DILATION;  Surgeon: Daneil Dolin, MD;  [facility];  Service: Endoscopy;  Laterality: N/A;  . POLYPECTOMY  08/13/2017   Procedure: POLYPECTOMY;  Surgeon: Daneil Dolin, MD;  Location[facility];  Service: Endoscopy;;  colon  . SHOULDER ARTHROSCOPY Right   . TUBAL LIGATION      Prior to Admission medications   Medication Sig Start Date End Date Taking? Authorizing Provider  acetaminophen (TYLENOL) 500 MG tablet Take 500 mg by mouth every 6 (six) hours as needed for mild pain or moderate pain.   Yes [provider]  apixaban (ELIQUIS) 5 MG TABS tablet Take 1 tablet (5 mg total) by mouth 2 (two) times daily. 08/18/20  Yes Barrett, Evelene Croon, PA-C  atorvastatin (LIPITOR) 80 MG tablet Take 1 tablet (80 mg total) by mouth at bedtime. 01/15/20  Yes Emokpae, Courage, MD  calcium carbonate (OS-CAL - DOSED IN MG OF ELEMENTAL CALCIUM) 1250 (500 Ca) MG tablet Take  500 mg by mouth daily.   Yes [provider]  Cholecalciferol (VITAMIN D) 50 MCG (2000 UT) CAPS Take 1 tablet daily Patient taking differently: Take 2,000 Units by mouth daily. 08/23/20  Yes [place], [name], MD  diclofenac Sodium (VOLTAREN) 1 % GEL Apply 1 application topically 4 (four) times daily as needed (pain).   Yes [provider]  dofetilide (TIKOSYN) 125 MCG capsule Take 1 capsule (125 mcg total) by mouth 2 (two) times daily. 10/15/20  Yes Theora Gianotti, NP  ferrous sulfate 325 (65 FE) MG EC  tablet Take 325 mg by mouth daily.   Yes [provider]  furosemide (LASIX) 40 MG tablet Take 40 mg twice a day 4 days/week (Tuesday, Thursday, Saturday, Sunday) and take 40 mg  daily 3 days/week (Monday, Wednesday and Friday) Patient taking differently: Take 40 mg by mouth See admin instructions. Take 40 mg twice a day 4 days/week (Tuesday, Thursday, Saturday, Sunday) and take 40 mg  daily 3 days/week (Monday, Wednesday and Friday) 08/31/20  Yes Barrett, Evelene Croon, PA-C  gabapentin (NEURONTIN) 300 MG capsule Take 300 mg by mouth 2 (two) times daily.    Yes [provider]  levothyroxine (SYNTHROID) 100 MCG tablet TAKE 1 TABLET BY MOUTH ONCE DAILY BEFORE BREAKFAST Patient taking differently: Take 100 mcg by mouth daily before breakfast. 06/14/20  Yes Nida, Marella Chimes, MD  loratadine (CLARITIN) 10 MG tablet Take 10 mg by mouth daily.   Yes [provider]  magnesium oxide (MAG-OX) 400 MG tablet Take 0.5 tablets (200 mg total) by mouth daily. 10/24/20  Yes Fenton, Clint R, PA  metoprolol tartrate (LOPRESSOR) 25 MG tablet Take 25 mg by mouth 2 (two) times daily.   Yes [provider]  nitroGLYCERIN (NITROSTAT) 0.4 MG SL tablet DISSOLVE ONE TABLET UNDER THE TONGUE EVERY 5 MINUTES AS NEEDED FOR CHEST PAIN.  DO NOT EXCEED A TOTAL OF 3 DOSES IN 15 MINUTES Patient taking differently: Place 0.4 mg under the tongue every 5 (five) minutes as needed for chest pain. DO NOT EXCEED A TOTAL OF 3 DOSES IN 15 MINUTES 01/08/21  Yes Verta Ellen., NP  potassium chloride SA (KLOR-CON) 20 MEQ tablet Take 2 tablets (40 mEq total) by mouth daily. Patient taking differently: Take 20 mEq by mouth See admin instructions. Take 20 mg twice a day 4 days/week (Tuesday, Thursday, Saturday, Sunday) and take 20 meq daily 3 days/week (Monday, Wednesday and Friday) 10/24/20  Yes Fenton, Clint R, PA  RANEXA 500 MG 12 hr tablet Take 1 tablet (500 mg total) by mouth 2 (two) times daily. 01/05/21  Yes  Verta Ellen., NP    Allergies as of 11/22/2020 - Review Complete 11/08/2020  Allergen Reaction Noted  . Lisinopril Anaphylaxis 06/13/2015  . Ranolazine Anaphylaxis 09/04/2020  . Vicodin [hydrocodone-acetaminophen] Diarrhea and Nausea And Vomiting 04/25/2016  . Amoxicillin Swelling 06/01/2018  . Demerol [meperidine] Nausea And Vomiting 01/17/2013  . Lodine [etodolac] Rash 01/17/2013    Family History  Problem Relation Age of Onset  . Heart failure Father        Deceased  . Heart attack Father        Deceased  . Stroke Sister        Deceased  . Heart failure Brother        Deceased  . Cancer Brother        Deceased  . Heart attack Brother        Deceased  .  Colon cancer Neg Hx     Social History   Socioeconomic History  . Marital status: Single    Spouse name: Not on file  . Number of children: Not on file  . Years of education: Not on file  . Highest education level: Not on file  Occupational History  . Not on file  Tobacco Use  . Smoking status: Former Smoker    Packs/day: 1.00    Years: 20.00    Pack years: 20.00    Types: Cigarettes    Start date: 12/02/1977    Quit date: 12/02/2002    Years since quitting: 18.1  . Smokeless tobacco: Never Used  Vaping Use  . Vaping Use: Never used  Substance and Sexual Activity  . Alcohol use: No    Alcohol/week: 0.0 standard drinks  . Drug use: No  . Sexual activity: Yes  Other Topics Concern  . Not on file  Social History Narrative  . Not on file   Social Determinants of Health   Financial Resource Strain: Low Risk   . Difficulty of Paying Living Expenses: Not very hard  Food Insecurity: Not on file  Transportation Needs: Not on file  Physical Activity: Not on file  Stress: Not on file  Social Connections: Not on file  Intimate Partner Violence: Not on file    Review of Systems: See HPI, otherwise negative ROS  Physical Exam: There were no vitals taken for this visit. General:   Alert,   Well-developed, well-nourished, pleasant and cooperative in NAD Neck:  Supple; no masses or thyromegaly. No significant cervical adenopathy. Lungs:  Clear throughout to auscultation.   No wheezes, crackles, or rhonchi. No acute distress. Heart:  Regular rate and rhythm; no murmurs, clicks, rubs,  or gallops. Abdomen: Non-distended, normal bowel sounds.  Soft and nontender without appreciable mass or hepatosplenomegaly.  Pulses:  Normal pulses noted. Extremities:  Without clubbing or edema.  Impression/Plan: 70 year old lady here for a surveillance colonoscopy.  History of high-grade dysplasia and adenomatous polyp removed previously.  Iron deficiency anemia.  I have offered the patient a colonoscopy today per plan.  Eliquis held x2 days.  She has been seen by cardiology.  I have discussed with anesthesia.  You should have another colonoscopy in 10 years.  The risks, benefits, limitations, alternatives and imponderables have been reviewed with the patient. Questions have been answered. All parties are agreeable.       Notice: This dictation was prepared with Dragon dictation along with smaller phrase technology. Any transcriptional errors that result from this process are unintentional and may not be corrected upon review.

## 2021-01-18 NOTE — Op Note (Signed)
[facility] Patient Name: Maria Fry Procedure Date: 01/18/2021 9:39 AM MRN: [id number] Date of Birth: 1951/06/27 Attending MD: Norvel Richards , MD CSN: [id number] Age: 70 Admit Type: Outpatient Procedure:                Colonoscopy Indications:              High risk colon cancer surveillance: Personal                            history of colonic polyps Providers:                Norvel Richards, MD, Gwenlyn Fudge, RN, Aram Candela Referring MD:              Medicines:                Propofol per Anesthesia Complications:            No immediate complications. Estimated Blood Loss:     Estimated blood loss was minimal. Procedure:                Pre-Anesthesia Assessment:                           - Prior to the procedure, a History and Physical                            was performed, and patient medications and                            allergies were reviewed. The patient's tolerance of                            previous anesthesia was also reviewed. The risks                            and benefits of the procedure and the sedation                            options and risks were discussed with the patient.                            All questions were answered, and informed consent                            was obtained. Prior Anticoagulants: The patient                            last took Eliquis (apixaban) 3 days prior to the                            procedure. ASA Grade Assessment: IV - A patient  with severe systemic disease that is a constant                            threat to life. After reviewing the risks and                            benefits, the patient was deemed in satisfactory                            condition to undergo the procedure.                           After obtaining informed consent, the colonoscope                            was passed under direct vision. Throughout the                             procedure, the patient's blood pressure, pulse, and                            oxygen saturations were monitored continuously. The                            CF-[id number] ([id number]) scope was introduced through                            the anus and advanced to the the cecum, identified                            by appendiceal orifice and ileocecal valve. The                            colonoscopy was performed without difficulty. The                            patient tolerated the procedure well. The quality                            of the bowel preparation was adequate. Scope In: 10:01:01 AM Scope Out: 10:33:58 AM Scope Withdrawal Time: 0 hours 14 minutes 19 seconds  Total Procedure Duration: 0 hours 32 minutes 57 seconds  Findings:      The perianal and digital rectal examinations were normal. Patient had a       redundant and elongated colon requiring changing of the patient's       position and external abdominal pressure to reach the cecum.      Six sessile polyps were found in the rectum, distal rectum, sigmoid       colon, ascending colon and cecum. The polyps were 3 to 8 mm in size.       These polyps were removed with a cold snare. Resection and retrieval       were complete. Estimated blood loss was minimal.      The exam was otherwise without abnormality on direct  and retroflexion       views. Impression:               - Six 3 to 8 mm polyps in the rectum, in the distal                            rectum, in the sigmoid colon, in the ascending                            colon and in the cecum, removed with a cold snare.                            Resected and retrieved. Redundant colon                           - The examination was otherwise normal on direct                            and retroflexion views. Moderate Sedation:      Moderate (conscious) sedation was personally administered by an       anesthesia professional. The following parameters  were monitored: oxygen       saturation, heart rate, blood pressure, respiratory rate, EKG, adequacy       of pulmonary ventilation, and response to care. Recommendation:           - Patient has a contact number available for                            emergencies. The signs and symptoms of potential                            delayed complications were discussed with the                            patient. Return to normal activities tomorrow.                            Written discharge instructions were provided to the                            patient.                           - Resume previous diet.                           - Continue present medications.                           - Repeat colonoscopy date to be determined after                            pending pathology results are reviewed for                            surveillance.                           -  Return to GI office in 2 months. Procedure Code(s):        --- Professional ---                           [phone], Colonoscopy, flexible; with removal of                            tumor(s), polyp(s), or other lesion(s) by snare                            technique Diagnosis Code(s):        --- Professional ---                           Z86.010, Personal history of colonic polyps                           K62.1, Rectal polyp                           K63.5, Polyp of colon CPT copyright 2019 [employer]. All rights reserved. The codes documented in this report are preliminary and upon coder review may  be revised to meet current compliance requirements. Cristopher Estimable. Linzie Boursiquot, MD Norvel Richards, MD 01/18/2021 10:43:03 AM This report has been signed electronically. Number of Addenda: 0

## 2021-01-18 NOTE — Anesthesia Procedure Notes (Signed)
Date/Time: 01/18/2021 9:54 AM Performed by: Vista Deck, CRNA Pre-anesthesia Checklist: Patient identified, Emergency Drugs available, Suction available, Timeout performed and Patient being monitored Patient Re-evaluated:Patient Re-evaluated prior to induction Oxygen Delivery Method: Nasal Cannula

## 2021-01-18 NOTE — Anesthesia Postprocedure Evaluation (Signed)
Anesthesia Post Note  Patient: [name]  Procedure(s) Performed: COLONOSCOPY WITH PROPOFOL (N/A ) POLYPECTOMY  Patient location during evaluation: Phase II Anesthesia Type: General Level of consciousness: awake and alert and patient cooperative Pain management: satisfactory to patient Vital Signs Assessment: post-procedure vital signs reviewed and stable Respiratory status: spontaneous breathing Cardiovascular status: stable Postop Assessment: no apparent nausea or vomiting Anesthetic complications: no   No complications documented.   Last Vitals:  Vitals:   01/18/21 1041 01/18/21 1043  BP: 135/70   Pulse: (!) 59   Resp: 19   Temp:  36.6 C  SpO2: 95%     Last Pain:  Vitals:   01/18/21 1041  TempSrc: Oral  PainSc:                  Drucie Opitz

## 2021-01-18 NOTE — Anesthesia Preprocedure Evaluation (Signed)
Anesthesia Evaluation  Patient identified by MRN, date of birth, ID band Patient awake    Reviewed: Allergy & Precautions, NPO status , Patient's Chart, lab work & pertinent test results, reviewed documented beta blocker date and time   History of Anesthesia Complications (+) PONV and history of anesthetic complications  Airway Mallampati: III  TM Distance: >3 FB Neck ROM: Full    Dental  (+) Dental Advisory Given, Missing   Pulmonary shortness of breath and with exertion, former smoker,    Pulmonary exam normal breath sounds clear to auscultation       Cardiovascular Exercise Tolerance: Poor hypertension, Pt. on medications and Pt. on home beta blockers + angina with exertion + CAD, + Past MI, +CHF and + DOE  Normal cardiovascular exam+ dysrhythmias Atrial Fibrillation + Valvular Problems/Murmurs MR  Rhythm:Regular Rate:Normal - Systolic murmurs, - Diastolic murmurs, - Carotid Bruit, - Peripheral Edema and - Systolic Click [id number] 10:93:23 [facility] ROUTINE RECORD Normal sinus rhythm T wave abnormality, consider lateral ischemia Abnormal ECG No significant change was found Confirmed by Virl Axe [phone]) on 10/23/2020 6:23:48 PM  1. Left ventricular ejection fraction, by estimation, is 40 to 45%. The left ventricle has mildly decreased function. The left ventricle demonstrates global hypokinesis. There is severe left ventricular hypertrophy. Left ventricular diastolic parameters are indeterminate.  2. Right ventricular systolic function is normal. The right ventricular size is normal. There is moderately elevated pulmonary artery systolic pressure.  3. Left atrial size was severely dilated.  4. Right atrial size was severely dilated.  5. The mitral valve is normal in structure. Mild to moderate mitral valve regurgitation. No evidence of mitral stenosis.  6. Tricuspid valve regurgitation is  moderate.  7. The aortic valve is tricuspid. Aortic valve regurgitation is mild to moderate.  8. Moderate pulmonary HTN, PASP is 50 mmHg.  9. The inferior vena cava is dilated in size with <50% respiratory variability, suggesting right atrial pressure of 15 mmHg.   2016 cath -   Dist LAD lesion, 100% stenosed.  Mid LAD lesion, 10% stenosed.  The left ventricular systolic function is normal.   1. 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 Good LV function  Plan: DAPT, beta blocker and risk factor modification    Neuro/Psych negative psych ROS   GI/Hepatic Neg liver ROS, GERD  Medicated,Upper GI bleeding   Endo/Other  Hypothyroidism Morbid obesity  Renal/GU Renal InsufficiencyRenal disease     Musculoskeletal  (+) Arthritis ,   Abdominal   Peds  Hematology  (+) anemia ,   Anesthesia Other Findings   Reproductive/Obstetrics negative OB ROS                             Anesthesia Physical  Anesthesia Plan  ASA: III  Anesthesia Plan: General   Post-op Pain Management:    Induction: Intravenous  PONV Risk Score and Plan: TIVA  Airway Management Planned: Nasal Cannula, Natural Airway and Simple Face Mask  Additional Equipment:   Intra-op Plan:   Post-operative Plan: Possible Post-op intubation/ventilation  Informed Consent: I have reviewed the patients History and Physical, chart, labs and discussed the procedure including the risks, benefits and alternatives for the proposed anesthesia with the patient or authorized representative who has indicated his/her understanding and acceptance.     Dental advisory given  Plan Discussed with: CRNA and Surgeon  Anesthesia Plan Comments:  Anesthesia Quick Evaluation  

## 2021-01-18 NOTE — Transfer of Care (Signed)
Immediate Anesthesia Transfer of Care Note  Patient: Maria Fry  Procedure(s) Performed: COLONOSCOPY WITH PROPOFOL (N/A ) POLYPECTOMY  Patient Location: Short Stay  Anesthesia Type:General  Level of Consciousness: awake and patient cooperative  Airway & Oxygen Therapy: Patient Spontanous Breathing  Post-op Assessment: Report given to RN and Post -op Vital signs reviewed and stable  Post vital signs: Reviewed and stable  Last Vitals:  Vitals Value Taken Time  BP    Temp    Pulse    Resp    SpO2     See vital sign flow sheet Last Pain:  Vitals:   01/18/21 0955  TempSrc:   PainSc: 0-No pain         Complications: No complications documented.

## 2021-01-19 LAB — SURGICAL PATHOLOGY

## 2021-01-22 ENCOUNTER — Ambulatory Visit: Payer: PPO | Admitting: "Endocrinology

## 2021-01-22 ENCOUNTER — Other Ambulatory Visit: Payer: Self-pay

## 2021-01-22 ENCOUNTER — Encounter: Payer: Self-pay | Admitting: Internal Medicine

## 2021-01-22 ENCOUNTER — Encounter: Payer: Self-pay | Admitting: "Endocrinology

## 2021-01-22 VITALS — BP 146/80 | HR 72 | Ht 60.0 in | Wt 218.4 lb

## 2021-01-22 DIAGNOSIS — E890 Postprocedural hypothyroidism: Secondary | ICD-10-CM | POA: Diagnosis not present

## 2021-01-22 MED ORDER — LEVOTHYROXINE SODIUM 100 MCG PO TABS: 100.0000 ug | ORAL_TABLET | Freq: Every day | ORAL | 3 refills | Status: DC

## 2021-01-22 NOTE — Progress Notes (Signed)
01/22/2021   Endocrinology follow-up note    Subjective:    Patient ID: Maria Fry, female    DOB: Mar 22, 1951,    Past Medical History:  Diagnosis Date  . Arthritis   . Atrial fibrillation ([place])   . CAD (coronary artery disease)    a. 06/07/15 NSTEMI/Cath: Dist LAD 100%, mid LAD 10%. Otw nl cors. Nl EF w/ inferoapical AK.  Marland Kitchen Chronic combined systolic (congestive) and diastolic (congestive) heart failure ([place]) 04/23/2016   a. 04/2016 Echo: EF 45%, Gr2 DD; b. 11/2019 Echo: EF 45-50%, mod LVH. Gr2 DD. Nl RV fxn. Sev dil LA.   Marland Kitchen Complication of anesthesia   . Dysrhythmia    AFib  . Essential hypertension   . Hip pain   . Hyperlipidemia   . Hypothyroidism   . MI (myocardial infarction) ([place]) 06/05/2015  . PONV (postoperative nausea and vomiting)   . Vitamin D deficiency    Past Surgical History:  Procedure Laterality Date  . ABDOMINAL HYSTERECTOMY    . BIOPSY  09/17/2020   Procedure: BIOPSY;  Surgeon: [name], DO;  [facility];  Service: Endoscopy;;  . CARDIAC CATHETERIZATION N/A 06/06/2015   Procedure: Left Heart Cath and Coronary Angiography;  Surgeon: Peter M Martinique, MD;  Location: [facility];  Service: Cardiovascular;  Laterality: N/A;  . CARDIOVERSION N/A 09/14/2020   Procedure: CARDIOVERSION;  Surgeon: Satira Sark, MD;  [facility];  Service: Cardiovascular;  Laterality: N/A;  . CHOLECYSTECTOMY    . COLONOSCOPY    . COLONOSCOPY N/A 08/13/2017   Procedure: COLONOSCOPY;  Surgeon: Daneil Dolin, MD;  [facility];  Service: Endoscopy;  Laterality: N/A;  8:30 AM  . ESOPHAGOGASTRODUODENOSCOPY N/A 09/11/2016   Procedure: ESOPHAGOGASTRODUODENOSCOPY (EGD);  Surgeon: Daneil Dolin, MD;  [facility];  Service: Endoscopy;  Laterality: N/A;  7:45 am - moved to 10/11 @ 10:30  . ESOPHAGOGASTRODUODENOSCOPY (EGD) WITH PROPOFOL N/A 09/17/2020   [name]: Diffuse moderate inflammation characterized by erosions and  erythema found in the entire stomach.  Biopsies positive for H. pylori.  Patient has not been treated due to drug allergies.  Marland Kitchen Heel spurs Left   . MALONEY DILATION N/A 09/11/2016   Procedure: Venia Minks DILATION;  Surgeon: Daneil Dolin, MD;  [facility];  Service: Endoscopy;  Laterality: N/A;  . POLYPECTOMY  08/13/2017   Procedure: POLYPECTOMY;  Surgeon: Daneil Dolin, MD;  [facility];  Service: Endoscopy;;  colon  . SHOULDER ARTHROSCOPY Right   . TUBAL LIGATION     Social History   Socioeconomic History  . Marital status: Single    Spouse name: Not on file  . Number of children: Not on file  . Years of education: Not on file  . Highest education level: Not on file  Occupational History  . Not on file  Tobacco Use  . Smoking status: Former Smoker    Packs/day: 1.00    Years: 20.00    Pack years: 20.00    Types: Cigarettes    Start date: 12/02/1977    Quit date: 12/02/2002    Years since quitting: 18.1  . Smokeless tobacco: Never Used  Vaping Use  . Vaping Use: Never used  Substance and Sexual Activity  . Alcohol use: No    Alcohol/week: 0.0 standard drinks  . Drug use: No  . Sexual activity: Yes  Other Topics Concern  . Not on file  Social History Narrative  .  Not on file   Social Determinants of Health   Financial Resource Strain: Low Risk   . Difficulty of Paying Living Expenses: Not very hard  Food Insecurity: Not on file  Transportation Needs: Not on file  Physical Activity: Not on file  Stress: Not on file  Social Connections: Not on file   Outpatient Encounter Medications as of 01/22/2021  Medication Sig  . acetaminophen (TYLENOL) 500 MG tablet Take 500 mg by mouth every 6 (six) hours as needed for mild pain or moderate pain.  Marland Kitchen apixaban (ELIQUIS) 5 MG TABS tablet Take 1 tablet (5 mg total) by mouth 2 (two) times daily.  Marland Kitchen atorvastatin (LIPITOR) 80 MG tablet Take 1 tablet (80 mg total) by mouth at bedtime.  . calcium carbonate (OS-CAL  - DOSED IN MG OF ELEMENTAL CALCIUM) 1250 (500 Ca) MG tablet Take 500 mg by mouth daily.  . Cholecalciferol (VITAMIN D) 50 MCG (2000 UT) CAPS Take 1 tablet daily (Patient taking differently: Take 2,000 Units by mouth daily.)  . diclofenac Sodium (VOLTAREN) 1 % GEL Apply 1 application topically 4 (four) times daily as needed (pain).  Marland Kitchen dofetilide (TIKOSYN) 125 MCG capsule Take 1 capsule (125 mcg total) by mouth 2 (two) times daily.  . ferrous sulfate 325 (65 FE) MG EC tablet Take 325 mg by mouth daily.  . furosemide (LASIX) 40 MG tablet Take 40 mg twice a day 4 days/week (Tuesday, Thursday, Saturday, Sunday) and take 40 mg  daily 3 days/week (Monday, Wednesday and Friday) (Patient taking differently: Take 40 mg by mouth See admin instructions. Take 40 mg twice a day 4 days/week (Tuesday, Thursday, Saturday, Sunday) and take 40 mg  daily 3 days/week (Monday, Wednesday and Friday))  . gabapentin (NEURONTIN) 300 MG capsule Take 300 mg by mouth 2 (two) times daily.   Marland Kitchen levothyroxine (SYNTHROID) 100 MCG tablet Take 1 tablet (100 mcg total) by mouth daily before breakfast.  . loratadine (CLARITIN) 10 MG tablet Take 10 mg by mouth daily.  . magnesium oxide (MAG-OX) 400 MG tablet Take 0.5 tablets (200 mg total) by mouth daily.  . metoprolol tartrate (LOPRESSOR) 25 MG tablet Take 25 mg by mouth 2 (two) times daily.  . nitroGLYCERIN (NITROSTAT) 0.4 MG SL tablet DISSOLVE ONE TABLET UNDER THE TONGUE EVERY 5 MINUTES AS NEEDED FOR CHEST PAIN.  DO NOT EXCEED A TOTAL OF 3 DOSES IN 15 MINUTES (Patient taking differently: Place 0.4 mg under the tongue every 5 (five) minutes as needed for chest pain. DO NOT EXCEED A TOTAL OF 3 DOSES IN 15 MINUTES)  . potassium chloride SA (KLOR-CON) 20 MEQ tablet Take 2 tablets (40 mEq total) by mouth daily. (Patient taking differently: Take 20 mEq by mouth See admin instructions. Take 20 mg twice a day 4 days/week (Tuesday, Thursday, Saturday, Sunday) and take 20 meq daily 3 days/week  (Monday, Wednesday and Friday))  . RANEXA 500 MG 12 hr tablet Take 1 tablet (500 mg total) by mouth 2 (two) times daily.  . [DISCONTINUED] levothyroxine (SYNTHROID) 100 MCG tablet TAKE 1 TABLET BY MOUTH ONCE DAILY BEFORE BREAKFAST (Patient taking differently: Take 100 mcg by mouth daily before breakfast.)   No facility-administered encounter medications on file as of 01/22/2021.   ALLERGIES: Allergies  Allergen Reactions  . Afrin [Oxymetazoline] Anaphylaxis  . Lisinopril Anaphylaxis  . Ranolazine Anaphylaxis    Dizzy, Reaction occurred with generic med  . Vicodin [Hydrocodone-Acetaminophen] Diarrhea and Nausea And Vomiting  . Amoxicillin Swelling  . Demerol [Meperidine] Nausea And  Vomiting  . Lodine [Etodolac] Rash   VACCINATION STATUS: Immunization History  Administered Date(s) Administered  . Fluad Quad(high Dose 65+) 10/21/2019, 08/18/2020  . Influenza, High Dose Seasonal PF 08/24/2018  . Influenza,inj,Quad PF,6+ Mos 10/02/2015, 08/19/2016, 08/25/2017, 09/02/2019  . PFIZER(Purple Top)SARS-COV-2 Vaccination 01/24/2020, 02/14/2020  . Pneumococcal Conjugate-13 01/01/2019  . Pneumococcal Polysaccharide-23 01/03/2020    Thyroid Problem Presents for follow-up (She was given RAI therapy for Graves' disease in the middle of September 2016.  She is currently on levothyroxine 100 mcg for a subsequent RAI induced hypothyroidism.) visit. Patient reports no cold intolerance, diarrhea, fatigue, heat intolerance or palpitations. The symptoms have been improving (She denies palpitations, tremors, heat/cold intolerance.   She denies dysphagia or shortness of breath, nor voice change.). Past treatments include iodine 131. The treatment provided significant relief. Risk factors include prior iodine 131 therapy.   Review of systems : Limited as above.  Objective:    BP (!) 146/80   Pulse 72   Ht 5' (1.524 m)   Wt 218 lb 6.4 oz (99.1 kg)   BMI 42.65 kg/m   Wt Readings from Last 3 Encounters:   01/22/21 218 lb 6.4 oz (99.1 kg)  01/18/21 216 lb 0.8 oz (98 kg)  01/16/21 216 lb (98 kg)      Results for orders placed or performed during the hospital encounter of 01/18/21  Surgical pathology  Result Value Ref Range   SURGICAL PATHOLOGY      SURGICAL PATHOLOGY CASE: [id number] PATIENT: Maria Fry Surgical Pathology Report     Clinical History: iron deficiency anemia, history of tubular adenomas with high grade dysplasia   FINAL MICROSCOPIC DIAGNOSIS:  A. COLON, SIGMOID, POLYPECTOMY: -  Hyperplastic polyp (2 of 2 fragments) -  No high grade dysplasia or malignancy identified  B. COLON, ASCENDING, POLYPECTOMY: -  Tubular adenoma (2 of 2 fragments) -  No high grade dysplasia or malignancy identified  C. COLON, CECAL, POLYPECTOMY: -  Tubular adenoma (1 of 2 fragments) -  Benign colonic mucosa (1 of 2 fragments) -  No high grade dysplasia or malignancy identified  D. RECTUM, POLYPECTOMY: -  Tubular adenoma (1 of 1 fragments) -  No high grade dysplasia or malignancy identified  GROSS DESCRIPTION:  Specimen A: Received in formalin are 2 pink-red polypoid tissues, 0.8 and 1 cm. In toto 1 block.  Specimen B: Received in formalin are 2 tan-pink polypoid tissues, 0.2 and 1.4 cm in greatest dime nsion. In toto 1 block.  Specimen C: Received in formalin are 5 irregular to polypoid tan-pink tissues which range from 0.1 to 0.5 cm. In toto 1 block.  Specimen D: Received in formalin is a 0.3 cm tan-pink polyp, in toto 1 block.  SW 01/18/2021    Final Diagnosis performed by Thressa Sheller, MD.   Electronically signed 01/19/2021 Technical component performed at [facility], [place] [address] [place], [place] [address].  Professional component performed at [employer]. [facility], [place] [address] [place], [place] [address].  Immunohistochemistry Technical component (if applicable) was performed at [facility]. [address] [place], [place], [place] [address].   IMMUNOHISTOCHEMISTRY DISCLAIMER (if applicable): Some of these immunohistochemical stains may have been developed and the performance characteristics determine by [facility]. Some may not have been cleared or approved by the U.S. Food and Drug Admini stration. The FDA has determined that such clearance or approval is not necessary. This test is used for clinical purposes. It should not be  regarded as investigational or for research. This laboratory is certified under the [place] (CLIA-88) as qualified to perform high complexity clinical laboratory testing.  The controls stained appropriately.      Assessment & Plan:   1.  RAI induced Hypothyroidism:   She is status post RAI therapy in September 2016 For hyperthyroidism. -Her previsit thyroid function tests are consistent with appropriate replacement.  She is advised to continue levothyroxine 100 mcg p.o. daily before breakfast.     - We discussed about the correct intake of her thyroid hormone, on empty stomach at fasting, with water, separated by at least 30 minutes from breakfast and other medications,  and separated by more than 4 hours from calcium, iron, multivitamins, acid reflux medications (PPIs). -Patient is made aware of the fact that thyroid hormone replacement is needed for life, dose to be adjusted by periodic monitoring of thyroid function tests.   I advised patient to maintain close follow up with her PCP for primary care needs.     - Time spent on this patient care encounter:  30 minutes of which 50% was spent in  counseling and the rest reviewing  her current and  previous labs / studies and medications  doses and developing a plan for long term care, and documenting this care. Maria Fry  participated in the discussions, expressed understanding, and voiced agreement with the above plans.  All questions were answered  to her satisfaction. she is encouraged to contact clinic should she have any questions or concerns prior to her return visit.   Follow up plan: Return in about 1 year (around 01/22/2022) for F/U with Pre-visit Labs.  Glade Lloyd, MD Phone: [phone]  Fax: [phone]  -  This note was partially dictated with voice recognition software. Similar sounding words can be transcribed inadequately or may not  be corrected upon review.  01/22/2021, 12:55 PM

## 2021-01-23 ENCOUNTER — Encounter (HOSPITAL_COMMUNITY): Payer: Self-pay | Admitting: Internal Medicine

## 2021-01-23 NOTE — Telephone Encounter (Signed)
Pt scheduled for HST for 2.23.22.

## 2021-01-24 ENCOUNTER — Other Ambulatory Visit: Payer: Self-pay

## 2021-01-24 ENCOUNTER — Ambulatory Visit: Payer: PPO

## 2021-01-24 DIAGNOSIS — R0683 Snoring: Secondary | ICD-10-CM

## 2021-01-29 ENCOUNTER — Ambulatory Visit: Payer: PPO

## 2021-01-29 DIAGNOSIS — G4733 Obstructive sleep apnea (adult) (pediatric): Secondary | ICD-10-CM | POA: Diagnosis not present

## 2021-01-30 ENCOUNTER — Other Ambulatory Visit: Payer: Self-pay

## 2021-01-30 ENCOUNTER — Ambulatory Visit (INDEPENDENT_AMBULATORY_CARE_PROVIDER_SITE_OTHER): Payer: PPO | Admitting: Nurse Practitioner

## 2021-01-30 ENCOUNTER — Encounter: Payer: Self-pay | Admitting: Nurse Practitioner

## 2021-01-30 DIAGNOSIS — I4819 Other persistent atrial fibrillation: Secondary | ICD-10-CM | POA: Diagnosis not present

## 2021-01-30 DIAGNOSIS — I214 Non-ST elevation (NSTEMI) myocardial infarction: Secondary | ICD-10-CM | POA: Diagnosis not present

## 2021-01-30 DIAGNOSIS — Z7689 Persons encountering health services in other specified circumstances: Secondary | ICD-10-CM

## 2021-01-30 DIAGNOSIS — G8929 Other chronic pain: Secondary | ICD-10-CM

## 2021-01-30 DIAGNOSIS — D649 Anemia, unspecified: Secondary | ICD-10-CM

## 2021-01-30 DIAGNOSIS — E559 Vitamin D deficiency, unspecified: Secondary | ICD-10-CM | POA: Diagnosis not present

## 2021-01-30 DIAGNOSIS — M25551 Pain in right hip: Secondary | ICD-10-CM

## 2021-01-30 DIAGNOSIS — E890 Postprocedural hypothyroidism: Secondary | ICD-10-CM

## 2021-01-30 DIAGNOSIS — I10 Essential (primary) hypertension: Secondary | ICD-10-CM

## 2021-01-30 DIAGNOSIS — E876 Hypokalemia: Secondary | ICD-10-CM

## 2021-01-30 NOTE — Assessment & Plan Note (Signed)
-  takes metoprolol, nitro PRN, and ranexa -followed by cardiology

## 2021-01-30 NOTE — Assessment & Plan Note (Signed)
-  followed by Dr. Merlene Laughter yearly -takes gabapentin BID

## 2021-01-30 NOTE — Progress Notes (Signed)
New Patient Office Visit  Subjective:  Patient ID: Maria Fry, female    DOB: 1951-04-13  Age: 70 y.o. MRN: [id number]  CC:  Chief Complaint  Patient presents with  . New Patient (Initial Visit)    HPI [name] presents for new patient visit. Transferring care from Dr. [name] Last physical was in Jan 2021. Last labs were thyroid labs from Dr. [name]. She sees Dr. Merlene Laughter once per year for a pinched nerve in her right hip, and this causes her right leg to have numbness and pain on the lateral aspect of her thigh.   Past Medical History:  Diagnosis Date  . Arthritis    right knee  . Atrial fibrillation ([place])   . CAD (coronary artery disease)    a. 06/07/15 NSTEMI/Cath: Dist LAD 100%, mid LAD 10%. Otw nl cors. Nl EF w/ inferoapical AK.  Marland Kitchen Chronic combined systolic (congestive) and diastolic (congestive) heart failure ([place]) 04/23/2016   a. 04/2016 Echo: EF 45%, Gr2 DD; b. 11/2019 Echo: EF 45-50%, mod LVH. Gr2 DD. Nl RV fxn. Sev dil LA.   Marland Kitchen Complication of anesthesia   . Dysrhythmia    AFib  . Essential hypertension   . Hip pain   . Hyperlipidemia   . Hypothyroidism   . MI (myocardial infarction) ([place]) 06/05/2015  . PONV (postoperative nausea and vomiting)   . Vitamin D deficiency     Past Surgical History:  Procedure Laterality Date  . ABDOMINAL HYSTERECTOMY     partial- one ovary remaining  . BIOPSY  09/17/2020   Procedure: BIOPSY;  Surgeon: [name], DO;  [facility];  Service: Endoscopy;;  . CARDIAC CATHETERIZATION N/A 06/06/2015   Procedure: Left Heart Cath and Coronary Angiography;  Surgeon: Peter M Martinique, MD;  Location: [facility];  Service: Cardiovascular;  Laterality: N/A;  . CARDIOVERSION N/A 09/14/2020   Procedure: CARDIOVERSION;  Surgeon: Satira Sark, MD;  [facility]: [facility];  Service: Cardiovascular;  Laterality: N/A;  . CHOLECYSTECTOMY    . COLONOSCOPY    . COLONOSCOPY N/A 08/13/2017   Procedure: COLONOSCOPY;   Surgeon: Daneil Dolin, MD;  [facility];  Service: Endoscopy;  Laterality: N/A;  8:30 AM  . COLONOSCOPY WITH PROPOFOL N/A 01/18/2021   Procedure: COLONOSCOPY WITH PROPOFOL;  Surgeon: Daneil Dolin, MD;  [facility];  Service: Endoscopy;  Laterality: N/A;  11:00am  ASA 4  . ESOPHAGOGASTRODUODENOSCOPY N/A 09/11/2016   Procedure: ESOPHAGOGASTRODUODENOSCOPY (EGD);  Surgeon: Daneil Dolin, MD;  [facility];  Service: Endoscopy;  Laterality: N/A;  7:45 am - moved to 10/11 @ 10:30  . ESOPHAGOGASTRODUODENOSCOPY (EGD) WITH PROPOFOL N/A 09/17/2020   [name]: Diffuse moderate inflammation characterized by erosions and erythema found in the entire stomach.  Biopsies positive for H. pylori.  Patient has not been treated due to drug allergies.  Marland Kitchen Heel spurs Left   . MALONEY DILATION N/A 09/11/2016   Procedure: Venia Minks DILATION;  Surgeon: Daneil Dolin, MD;  [facility];  Service: Endoscopy;  Laterality: N/A;  . POLYPECTOMY  08/13/2017   Procedure: POLYPECTOMY;  Surgeon: Daneil Dolin, MD;  [facility];  Service: Endoscopy;;  colon  . POLYPECTOMY  01/18/2021   Procedure: POLYPECTOMY;  Surgeon: Daneil Dolin, MD;  [facility];  Service: Endoscopy;;  . SHOULDER ARTHROSCOPY Right   . TUBAL LIGATION      Family History  Problem Relation Age  of Onset  . Heart failure Father        Deceased  . Heart attack Father        Deceased  . Stroke Sister        Deceased  . Heart failure Brother        Deceased  . Cancer Brother        Deceased  . Heart attack Brother        Deceased  . Colon cancer Neg Hx     Social History   Socioeconomic History  . Marital status: Single    Spouse name: Not on file  . Number of children: Not on file  . Years of education: Not on file  . Highest education level: Not on file  Occupational History  . Not on file  Tobacco Use  . Smoking status: Former Smoker    Packs/day: 1.00    Years:  20.00    Pack years: 20.00    Types: Cigarettes    Start date: 12/02/1977    Quit date: 12/02/2002    Years since quitting: 18.1  . Smokeless tobacco: Never Used  Vaping Use  . Vaping Use: Never used  Substance and Sexual Activity  . Alcohol use: No    Alcohol/week: 0.0 standard drinks  . Drug use: No  . Sexual activity: Yes    Birth control/protection: Surgical  Other Topics Concern  . Not on file  Social History Narrative   Retired from [employer].   Social Determinants of Health   Financial Resource Strain: Low Risk   . Difficulty of Paying Living Expenses: Not very hard  Food Insecurity: Not on file  Transportation Needs: Not on file  Physical Activity: Not on file  Stress: Not on file  Social Connections: Not on file  Intimate Partner Violence: Not on file    ROS Review of Systems  Constitutional: Negative.   Respiratory: Negative.   Cardiovascular: Negative.   Musculoskeletal: Positive for arthralgias.       Right shoulder, right hip, and right knee  Neurological: Positive for numbness.       To right hip and lateral aspect of right thigh  Psychiatric/Behavioral: Negative.     Objective:   Today's Vitals: BP 123/75   Pulse 60   Temp 98 F (36.7 C)   Resp 18   Ht 5' (1.524 m)   Wt 218 lb (98.9 kg)   SpO2 98%   BMI 42.58 kg/m   Physical Exam Constitutional:      Appearance: Normal appearance.  Cardiovascular:     Rate and Rhythm: Normal rate. Rhythm irregular.     Pulses: Normal pulses.     Heart sounds: Normal heart sounds.  Pulmonary:     Effort: Pulmonary effort is normal.     Breath sounds: Normal breath sounds.  Musculoskeletal:        General: Normal range of motion.  Neurological:     Mental Status: She is alert.  Psychiatric:        Mood and Affect: Mood normal.        Behavior: Behavior normal.        Thought Content: Thought content normal.        Judgment: Judgment normal.     Assessment & Plan:   Problem List Items Addressed  This Visit      Cardiovascular and Mediastinum   Hypertension    -BP well controlled -no change in meds      Relevant Orders  CBC with Differential/Platelet   CMP14+EGFR   Lipid Panel With LDL/HDL Ratio   NSTEMI (non-ST elevated myocardial infarction) (HCC)    -takes metoprolol, nitro PRN, and ranexa -followed by cardiology      Relevant Orders   CBC with Differential/Platelet   CMP14+EGFR   Lipid Panel With LDL/HDL Ratio   Persistent atrial fibrillation (HCC)    -on tikosyn, eliquis, metoprolol -followed by Dr. Rayann Heman for cardiology        Endocrine   Hypothyroidism following radioiodine therapy     Other   Chronic hip pain    -followed by Dr. Merlene Laughter yearly -takes gabapentin BID      Hypokalemia    -will check labs prior to next appt      Anemia    -will draw labs prior to next appt      Vitamin D deficiency    -taking vit D 2000 IU daily -will check with next set of labs      Relevant Orders   Vitamin D (25 hydroxy)   Encounter to establish care    -coming form Dr. [name] -records in Epic      Relevant Orders   CBC with Differential/Platelet   CMP14+EGFR   Lipid Panel With LDL/HDL Ratio   Magnesium   Vitamin D (25 hydroxy)    Other Visit Diagnoses    Hypomagnesemia    -  Primary   Relevant Orders   Magnesium      Outpatient Encounter Medications as of 01/30/2021  Medication Sig  . acetaminophen (TYLENOL) 500 MG tablet Take 500 mg by mouth every 6 (six) hours as needed for mild pain or moderate pain.  Marland Kitchen atorvastatin (LIPITOR) 80 MG tablet Take 1 tablet (80 mg total) by mouth at bedtime.  . calcium carbonate (OS-CAL - DOSED IN MG OF ELEMENTAL CALCIUM) 1250 (500 Ca) MG tablet Take 500 mg by mouth daily.  . Cholecalciferol (VITAMIN D) 50 MCG (2000 UT) CAPS Take 1 tablet daily (Patient taking differently: Take 2,000 Units by mouth daily.)  . diclofenac Sodium (VOLTAREN) 1 % GEL Apply 1 application topically 4 (four) times daily as needed  (pain).  Marland Kitchen dofetilide (TIKOSYN) 125 MCG capsule Take 1 capsule (125 mcg total) by mouth 2 (two) times daily.  . ferrous sulfate 325 (65 FE) MG EC tablet Take 325 mg by mouth daily.  . furosemide (LASIX) 40 MG tablet Take 40 mg twice a day 4 days/week (Tuesday, Thursday, Saturday, Sunday) and take 40 mg  daily 3 days/week (Monday, Wednesday and Friday) (Patient taking differently: Take 40 mg by mouth See admin instructions. Take 40 mg twice a day 4 days/week (Tuesday, Thursday, Saturday, Sunday) and take 40 mg  daily 3 days/week (Monday, Wednesday and Friday))  . gabapentin (NEURONTIN) 300 MG capsule Take 300 mg by mouth 2 (two) times daily.   Marland Kitchen levothyroxine (SYNTHROID) 100 MCG tablet Take 1 tablet (100 mcg total) by mouth daily before breakfast.  . loratadine (CLARITIN) 10 MG tablet Take 10 mg by mouth daily.  . magnesium oxide (MAG-OX) 400 MG tablet Take 0.5 tablets (200 mg total) by mouth daily.  . metoprolol tartrate (LOPRESSOR) 25 MG tablet Take 25 mg by mouth 2 (two) times daily.  . nitroGLYCERIN (NITROSTAT) 0.4 MG SL tablet DISSOLVE ONE TABLET UNDER THE TONGUE EVERY 5 MINUTES AS NEEDED FOR CHEST PAIN.  DO NOT EXCEED A TOTAL OF 3 DOSES IN 15 MINUTES (Patient taking differently: Place 0.4 mg under the tongue every 5 (five) minutes as  needed for chest pain. DO NOT EXCEED A TOTAL OF 3 DOSES IN 15 MINUTES)  . potassium chloride SA (KLOR-CON) 20 MEQ tablet Take 2 tablets (40 mEq total) by mouth daily. (Patient taking differently: Take 20 mEq by mouth See admin instructions. Take 20 mg twice a day 4 days/week (Tuesday, Thursday, Saturday, Sunday) and take 20 meq daily 3 days/week (Monday, Wednesday and Friday))  . RANEXA 500 MG 12 hr tablet Take 1 tablet (500 mg total) by mouth 2 (two) times daily.  Marland Kitchen apixaban (ELIQUIS) 5 MG TABS tablet Take 1 tablet (5 mg total) by mouth 2 (two) times daily.   No facility-administered encounter medications on file as of 01/30/2021.    Follow-up: Return in about 1  month (around 03/02/2021) for Physical Exam (no PAP).   Noreene Larsson, NP

## 2021-01-30 NOTE — Assessment & Plan Note (Signed)
-  will check labs prior to next appt

## 2021-01-30 NOTE — Assessment & Plan Note (Signed)
-  coming form Dr. [name] -records in [place]

## 2021-01-30 NOTE — Assessment & Plan Note (Signed)
-  BP well controlled -no change in meds

## 2021-01-30 NOTE — Assessment & Plan Note (Signed)
-  taking vit D 2000 IU daily -will check with next set of labs

## 2021-01-30 NOTE — Patient Instructions (Signed)
Please have fasting labs drawn 2-3 days prior to your next appointment. 

## 2021-01-30 NOTE — Assessment & Plan Note (Signed)
-  will draw labs prior to next appt

## 2021-01-30 NOTE — Assessment & Plan Note (Signed)
-  on tikosyn, eliquis, metoprolol -followed by Dr. Rayann Heman for cardiology

## 2021-01-31 ENCOUNTER — Telehealth: Payer: Self-pay | Admitting: Pulmonary Disease

## 2021-01-31 DIAGNOSIS — G4733 Obstructive sleep apnea (adult) (pediatric): Secondary | ICD-10-CM | POA: Diagnosis not present

## 2021-01-31 NOTE — Telephone Encounter (Signed)
HST 01/29/21 >> AHI 9.9, SpO2 low 76%   Please inform her that her sleep study shows mild obstructive sleep apnea.  Please arrange for ROV with me or NP to discuss treatment options.

## 2021-01-31 NOTE — Telephone Encounter (Signed)
-----   Message from Jake Samples sent at 01/30/2021 10:35 AM EST ----- Iesha Reid's HST has been downloaded and is ready for you to read.

## 2021-02-01 NOTE — Telephone Encounter (Signed)
Called and left message on voicemail to please return phone call to go over results. Contact number provided. 

## 2021-02-02 ENCOUNTER — Telehealth: Payer: Self-pay | Admitting: Pulmonary Disease

## 2021-02-02 NOTE — Telephone Encounter (Signed)
Called and went over HST results per Dr Halford Chessman with patient. All questions answered and patient expressed full understanding. Scheduled office visit for Tuesday, 02/06/2021 at 11am with NP at the [facility] office. Patient agreeable to time, date and location. Nothing further needed at this time.

## 2021-02-02 NOTE — Telephone Encounter (Signed)
Called patient but she did not answer. Her VM was full so I could not leave a message. Will close this encounter since there already an encounter open for this same matter.

## 2021-02-06 ENCOUNTER — Ambulatory Visit: Payer: PPO | Admitting: Primary Care

## 2021-02-06 ENCOUNTER — Other Ambulatory Visit: Payer: Self-pay

## 2021-02-06 ENCOUNTER — Encounter: Payer: Self-pay | Admitting: Primary Care

## 2021-02-06 VITALS — BP 134/88 | HR 71 | Temp 97.3°F | Ht 60.0 in | Wt 217.8 lb

## 2021-02-06 DIAGNOSIS — G4730 Sleep apnea, unspecified: Secondary | ICD-10-CM | POA: Insufficient documentation

## 2021-02-06 DIAGNOSIS — G4733 Obstructive sleep apnea (adult) (pediatric): Secondary | ICD-10-CM

## 2021-02-06 DIAGNOSIS — R0683 Snoring: Secondary | ICD-10-CM | POA: Diagnosis not present

## 2021-02-06 NOTE — Patient Instructions (Signed)
Pleasure meeting you today Maria Fry  Home sleep study showed mild obstructive sleep apnea  Recommended treatment options include weight loss, side sleeping position, oral appliance, CPAP therapy or referral to ENT for possible surgical options. Due to your underlying cardiac conditions I recommend CPAP therapy   Recommendations: Aim to wear CPAP every night for 4-6 hours or longer Do not drive if experiencing excessive daytime fatigue or somnolence Continue to work on weight loss efforts   Orders: Start CPAP auto titrate 5-15cm h20, mask of choice   Follow-up: 1-3 months AFTER you receive CPAP machine and start wearing    CPAP and BPAP Information CPAP and BPAP are methods that use air pressure to keep your airways open and to help you breathe well. CPAP and BPAP use different amounts of pressure. Your health care provider will tell you whether CPAP or BPAP would be more helpful for you.  CPAP stands for "continuous positive airway pressure." With CPAP, the amount of pressure stays the same while you breathe in and out.  BPAP stands for "bi-level positive airway pressure." With BPAP, the amount of pressure will be higher when you breathe in (inhale) and lower when you breathe out(exhale). This allows you to take larger breaths. CPAP or BPAP may be used in the hospital, or your health care provider may want you to use it at home. You may need to have a sleep study before your health care provider can order a machine for you to use at home. Why are CPAP and BPAP treatments used? CPAP or BPAP can be helpful if you have:  Sleep apnea.  Chronic obstructive pulmonary disease (COPD).  Heart failure.  Medical conditions that cause muscle weakness, including muscular dystrophy or amyotrophic lateral sclerosis (ALS).  Other problems that cause breathing to be shallow, weak, abnormal, or difficult. CPAP and BPAP are most commonly used for obstructive sleep apnea (OSA) to keep the airways  from collapsing when the muscles relax during sleep. How is CPAP or BPAP administered? Both CPAP and BPAP are provided by a small machine with a flexible plastic tube that attaches to a plastic mask that you wear. Air is blown through the mask into your nose or mouth. The amount of pressure that is used to blow the air can be adjusted on the machine. Your health care provider will set the pressure setting and help you find the best mask for you. When should CPAP or BPAP be used? In most cases, the mask only needs to be worn during sleep. Generally, the mask needs to be worn throughout the night and during any daytime naps. People with certain medical conditions may also need to wear the mask at other times when they are awake. Follow instructions from your health care provider about when to use the machine. What are some tips for using the mask?  Because the mask needs to be snug, some people feel trapped or closed-in (claustrophobic) when first using the mask. If you feel this way, you may need to get used to the mask. One way to do this is to hold the mask loosely over your nose or mouth and then gradually apply the mask more snugly. You can also gradually increase the amount of time that you use the mask.  Masks are available in various types and sizes. If your mask does not fit well, talk with your health care provider about getting a different one. Some common types of masks include: ? Full face masks, which fit  over the mouth and nose. ? Nasal masks, which fit over the nose. ? Nasal pillow or prong masks, which fit into the nostrils.  If you are using a mask that fits over your nose and you tend to breathe through your mouth, a chin strap may be applied to help keep your mouth closed.  Some CPAP and BPAP machines have alarms that may sound if the mask comes off or develops a leak.  If you have trouble with the mask, it is very important that you talk with your health care provider about  finding a way to make the mask easier to tolerate. Do not stop using the mask. There could be a negative impact to your health if you stop using the mask.   What are some tips for using the machine?  Place your CPAP or BPAP machine on a secure table or stand near an electrical outlet.  Know where the on/off switch is on the machine.  Follow instructions from your health care provider about how to set the pressure on your machine and when you should use it.  Do not eat or drink while the CPAP or BPAP machine is on. Food or fluids could get pushed into your lungs by the pressure of the CPAP or BPAP.  For home use, CPAP and BPAP machines can be rented or purchased through home health care companies. Many different brands of machines are available. Renting a machine before purchasing may help you find out which particular machine works well for you. Your insurance may also decide which machine you may get.  Keep the CPAP or BPAP machine and attachments clean. Ask your health care provider for specific instructions. Follow these instructions at home:  Do not use any products that contain nicotine or tobacco, such as cigarettes, e-cigarettes, and chewing tobacco. If you need help quitting, ask your health care provider.  Keep all follow-up visits as told by your health care provider. This is important. Contact a health care provider if:  You have redness or pressure sores on your head, face, mouth, or nose from the mask or head gear.  You have trouble using the CPAP or BPAP machine.  You cannot tolerate wearing the CPAP or BPAP mask.  Someone tells you that you snore even when wearing your CPAP or BPAP. Get help right away if:  You have trouble breathing.  You feel confused. Summary  CPAP and BPAP are methods that use air pressure to keep your airways open and to help you breathe well.  You may need to have a sleep study before your health care provider can order a machine for home  use.  If you have trouble with the mask, it is very important that you talk with your health care provider about finding a way to make the mask easier to tolerate. Do not stop using the mask. There could be a negative impact to your health if you stop using the mask.  Follow instructions from your health care provider about when to use the machine. This information is not intended to replace advice given to you by your health care provider. Make sure you discuss any questions you have with your health care provider. Document Revised: 12/10/2019 Document Reviewed: 12/13/2019 Elsevier Patient Education  2021 [employer].

## 2021-02-06 NOTE — Assessment & Plan Note (Addendum)
-   HST 01/29/21>> AHI 9.9/hr, SpO2 low 76% (average 90%) - We reviewed treatment options and risk untreated sleep apnea at length. Due to patient's cardiac history I recommend starting CPAP therapy for treatment of OSA - Order placed for AUTO CPAP 5-15cm h20, mask of choice - Advised she aim to wear CPAP every night for min 4-6 hours or longer and not to drive if experiencing excessive daytime fatigue  - Continue to work on weight loss efforts - FU 31-90 days after receiving CPAP machine

## 2021-02-06 NOTE — Progress Notes (Signed)
Reviewed and agree with assessment/plan.   Chesley Mires, MD [facility] Pulmonary/Critical Care 02/06/2021, 11:40 AM Pager:  [phone]

## 2021-02-06 NOTE — Progress Notes (Signed)
@Patient  ID: Maria Fry, female    DOB: [birth year], 70 y.o.   MRN: [id number]  Chief Complaint  Patient presents with  . Follow-up    Pt is here to discuss HST results and treatment options.    Referring provider: Noreene Larsson, NP  HPI: 70 year old female, former smoker quit in 2004 (20-pack-year history).  Past medical history significant for chronic combined systolic and diastolic heart failure, hypertension, NSTEMI, A. fib, coronary artery disease, GERD, hypothyroidism, obesity.  Patient of Dr. Halford Chessman, seen for sleep consult on 11/08/20 for snoring.   02/06/2021 - Interim hx  Patient presents today to review sleep study results. She has symptoms of snoring and a significant cardiac history. HST on 01/29/2021 showed mild obstructive sleep apnea, AHI 9.9/hr. Treatment options include weight loss, side sleeping position, oral appliance, CPAP therapy or referral to ENT for possible surgical options. Due to patient's underlying cardiac conditions I recommend CPAP therapy.   Sleep testing:  HST 01/29/21>> AHI 9.9/hr, SpO2 low 76%  Allergies  Allergen Reactions  . Afrin [Oxymetazoline] Anaphylaxis  . Amoxicillin Anaphylaxis  . Lisinopril Anaphylaxis  . Ranolazine Anaphylaxis    Dizzy, Reaction occurred with generic med  . Vicodin [Hydrocodone-Acetaminophen] Diarrhea and Nausea And Vomiting  . Demerol [Meperidine] Nausea And Vomiting  . Lodine [Etodolac] Rash    Immunization History  Administered Date(s) Administered  . Fluad Quad(high Dose 65+) 10/21/2019, 08/18/2020  . Influenza, High Dose Seasonal PF 08/24/2018  . Influenza,inj,Quad PF,6+ Mos 10/02/2015, 08/19/2016, 08/25/2017, 09/02/2019  . PFIZER(Purple Top)SARS-COV-2 Vaccination 01/24/2020, 02/14/2020, 10/31/2020  . Pneumococcal Conjugate-13 01/01/2019  . Pneumococcal Polysaccharide-23 01/03/2020    Past Medical History:  Diagnosis Date  . Arthritis    right knee  . Atrial fibrillation ([place])   . CAD (coronary artery  disease)    a. 06/07/15 NSTEMI/Cath: Dist LAD 100%, mid LAD 10%. Otw nl cors. Nl EF w/ inferoapical AK.  Marland Kitchen Chronic combined systolic (congestive) and diastolic (congestive) heart failure ([place]) 04/23/2016   a. 04/2016 Echo: EF 45%, Gr2 DD; b. 11/2019 Echo: EF 45-50%, mod LVH. Gr2 DD. Nl RV fxn. Sev dil LA.   Marland Kitchen Complication of anesthesia   . Dysrhythmia    AFib  . Essential hypertension   . Hip pain   . Hyperlipidemia   . Hypothyroidism   . MI (myocardial infarction) ([place]) 06/05/2015  . PONV (postoperative nausea and vomiting)   . Vitamin D deficiency     Tobacco History: Social History   Tobacco Use  Smoking Status Former Smoker  . Packs/day: 1.00  . Years: 20.00  . Pack years: 20.00  . Types: Cigarettes  . Start date: 12/02/1977  . Quit date: 12/02/2002  . Years since quitting: 18.1  Smokeless Tobacco Never Used   Counseling given: Not Answered   Outpatient Medications Prior to Visit  Medication Sig Dispense Refill  . acetaminophen (TYLENOL) 500 MG tablet Take 500 mg by mouth every 6 (six) hours as needed for mild pain or moderate pain.    Marland Kitchen apixaban (ELIQUIS) 5 MG TABS tablet Take 1 tablet (5 mg total) by mouth 2 (two) times daily. 60 tablet 11  . atorvastatin (LIPITOR) 80 MG tablet Take 1 tablet (80 mg total) by mouth at bedtime. 90 tablet 3  . calcium carbonate (OS-CAL - DOSED IN MG OF ELEMENTAL CALCIUM) 1250 (500 Ca) MG tablet Take 500 mg by mouth daily.    . Cholecalciferol (VITAMIN D) 50 MCG (2000 UT) CAPS Take 1 tablet daily (Patient taking  differently: Take 2,000 Units by mouth daily.) 30 capsule 2  . diclofenac Sodium (VOLTAREN) 1 % GEL Apply 1 application topically 4 (four) times daily as needed (pain).    Marland Kitchen dofetilide (TIKOSYN) 125 MCG capsule Take 1 capsule (125 mcg total) by mouth 2 (two) times daily. 60 capsule 6  . ferrous sulfate 325 (65 FE) MG EC tablet Take 325 mg by mouth daily.    . furosemide (LASIX) 40 MG tablet Take 40 mg twice a day 4 days/week (Tuesday,  Thursday, Saturday, Sunday) and take 40 mg  daily 3 days/week (Monday, Wednesday and Friday) (Patient taking differently: Take 40 mg by mouth See admin instructions. Take 40 mg twice a day 4 days/week (Tuesday, Thursday, Saturday, Sunday) and take 40 mg  daily 3 days/week (Monday, Wednesday and Friday)) 120 tablet 6  . gabapentin (NEURONTIN) 300 MG capsule Take 300 mg by mouth 2 (two) times daily.     Marland Kitchen levothyroxine (SYNTHROID) 100 MCG tablet Take 1 tablet (100 mcg total) by mouth daily before breakfast. 90 tablet 3  . loratadine (CLARITIN) 10 MG tablet Take 10 mg by mouth daily.    . magnesium oxide (MAG-OX) 400 MG tablet Take 0.5 tablets (200 mg total) by mouth daily. 30 tablet 6  . metoprolol tartrate (LOPRESSOR) 25 MG tablet Take 25 mg by mouth 2 (two) times daily.    . nitroGLYCERIN (NITROSTAT) 0.4 MG SL tablet DISSOLVE ONE TABLET UNDER THE TONGUE EVERY 5 MINUTES AS NEEDED FOR CHEST PAIN.  DO NOT EXCEED A TOTAL OF 3 DOSES IN 15 MINUTES (Patient taking differently: Place 0.4 mg under the tongue every 5 (five) minutes as needed for chest pain. DO NOT EXCEED A TOTAL OF 3 DOSES IN 15 MINUTES) 25 tablet 3  . potassium chloride SA (KLOR-CON) 20 MEQ tablet Take 2 tablets (40 mEq total) by mouth daily. (Patient taking differently: Take 20 mEq by mouth See admin instructions. Take 20 mg twice a day 4 days/week (Tuesday, Thursday, Saturday, Sunday) and take 20 meq daily 3 days/week (Monday, Wednesday and Friday)) 90 tablet 6  . RANEXA 500 MG 12 hr tablet Take 1 tablet (500 mg total) by mouth 2 (two) times daily. 60 tablet 6   No facility-administered medications prior to visit.   Review of Systems  Review of Systems  Constitutional: Negative.   Respiratory: Negative.   Cardiovascular: Negative.    Physical Exam  BP 134/88 (BP Location: Left Arm, Cuff Size: Large)   Pulse 71   Temp (!) 97.3 F (36.3 C) (Temporal)   Ht 5' (1.524 m)   Wt 217 lb 12.8 oz (98.8 kg)   SpO2 98% Comment: RA  BMI 42.54  kg/m  Physical Exam Constitutional:      Appearance: Normal appearance.  HENT:     Head: Normocephalic and atraumatic.     Mouth/Throat:     Mouth: Mucous membranes are moist.     Pharynx: Oropharynx is clear.     Comments: Mallampati class III Cardiovascular:     Rate and Rhythm: Normal rate and regular rhythm.     Comments: RRR Pulmonary:     Effort: Pulmonary effort is normal.     Breath sounds: Normal breath sounds.     Comments: CTA Musculoskeletal:        General: Normal range of motion.  Skin:    General: Skin is warm and dry.  Neurological:     General: No focal deficit present.     Mental Status: She is  alert and oriented to person, place, and time. Mental status is at baseline.  Psychiatric:        Mood and Affect: Mood normal.        Behavior: Behavior normal.        Thought Content: Thought content normal.        Judgment: Judgment normal.      Lab Results:  CBC    Component Value Date/Time   WBC 6.9 11/30/2020 0842   RBC 3.69 (L) 11/30/2020 0842   HGB 11.0 (L) 11/30/2020 0842   HCT 33.0 (L) 11/30/2020 0842   PLT 257 11/30/2020 0842   MCV 89.4 11/30/2020 0842   MCH 29.8 11/30/2020 0842   MCHC 33.3 11/30/2020 0842   RDW 18.0 (H) 11/30/2020 0842   LYMPHSABS 2,387 11/30/2020 0842   MONOABS 0.3 09/15/2020 1626   EOSABS 152 11/30/2020 0842   BASOSABS 28 11/30/2020 0842    BMET    Component Value Date/Time   NA 137 01/16/2021 1059   NA 142 12/22/2020 1617   K 4.2 01/16/2021 1059   CL 104 01/16/2021 1059   CO2 27 01/16/2021 1059   GLUCOSE 86 01/16/2021 1059   BUN 19 01/16/2021 1059   BUN 17 12/22/2020 1617   CREATININE 0.89 01/16/2021 1059   CREATININE 0.97 08/23/2020 1117   CALCIUM 9.1 01/16/2021 1059   GFRNONAA >60 01/16/2021 1059   GFRNONAA 60 08/23/2020 1117   GFRAA 65 12/22/2020 1617   GFRAA 70 08/23/2020 1117    BNP    Component Value Date/Time   BNP 1,278.0 (H) 09/15/2020 1955   BNP 171 (H) 12/10/2019 0953    ProBNP No  results found for: PROBNP  Imaging: No results found.   Assessment & Plan:   Sleep apnea - HST 01/29/21>> AHI 9.9/hr, SpO2 low 76% (average 90%) - We reviewed treatment options and risk untreated sleep apnea at length. Due to patient's cardiac history I recommend starting CPAP therapy for treatment of OSA - Order placed for AUTO CPAP 5-15cm h20, mask of choice - Advised she aim to wear CPAP every night for min 4-6 hours or longer and not to drive if experiencing excessive daytime fatigue  - Continue to work on weight loss efforts - FU 31-90 days after receiving CPAP machine      Martyn Ehrich, NP 02/06/2021

## 2021-02-08 ENCOUNTER — Ambulatory Visit (INDEPENDENT_AMBULATORY_CARE_PROVIDER_SITE_OTHER): Payer: PPO

## 2021-02-08 DIAGNOSIS — I5042 Chronic combined systolic (congestive) and diastolic (congestive) heart failure: Secondary | ICD-10-CM

## 2021-02-08 LAB — ECHOCARDIOGRAM COMPLETE
AV Vena cont: 0.44 cm
Calc EF: 49.5 %
MV M vel: 5.77 m/s
MV Peak grad: 133.2 mmHg
P 1/2 time: 562 msec
Radius: 0.6 cm
S' Lateral: 5.12 cm
Single Plane A2C EF: 55.6 %
Single Plane A4C EF: 47.2 %

## 2021-02-09 ENCOUNTER — Telehealth: Payer: Self-pay | Admitting: *Deleted

## 2021-02-09 NOTE — Telephone Encounter (Signed)
-----   Message from Satira Sark, MD sent at 02/09/2021  8:25 AM EST ----- Results reviewed.  This echocardiogram was actually ordered by Mr. Leonides Sake NP, I reviewed the chart, former patient of Dr. Bronson Ing.  Present LVEF 50 to 55% represents an improvement compared to assessment last year.  Mild to moderate mitral regurgitation is stable and moderately elevated pulmonary pressure is also stable.

## 2021-02-13 ENCOUNTER — Telehealth: Payer: Self-pay | Admitting: Pharmacist

## 2021-02-13 NOTE — Progress Notes (Addendum)
Chronic Care Management Pharmacy Assistant   Name: Maria Fry  MRN: [id number] DOB: [birth year]   Reason for Encounter:General Disease State Call   Conditions to be addressed/monitored: hypertension, CAD, HF, hypothyroidism, DDD, hyperlipidemia, anemia.  Recent office visits:  01/30/21 Noreene Larsson, NP. For Follow-up. No medication changes.  Recent consult visits:  02/06/21 Pulmonology Martyn Ehrich, NP. For snoring/Sleep Apnea. Per note: discussed CPAP. No medication changes. 01/22/21 Endo Cassandria Anger, MD. For hypothyroidism. No medication changes. 01/05/21 Cardiology Verta Ellen. NP. For prep clearance. No medication changes.  Hospital visits:  01/18/21 Daneil Dolin, MD. For colonoscopy. Discharged on 01/18/21. Per note: Discussed that she should have another colonoscopy in 10 years. No medication changes.  Medications: Outpatient Encounter Medications as of 02/13/2021  Medication Sig Note   acetaminophen (TYLENOL) 500 MG tablet Take 500 mg by mouth every 6 (six) hours as needed for mild pain or moderate pain.    apixaban (ELIQUIS) 5 MG TABS tablet Take 1 tablet (5 mg total) by mouth 2 (two) times daily. 01/16/2021: Last dose 01/15/2021. On hold for procedure.   atorvastatin (LIPITOR) 80 MG tablet Take 1 tablet (80 mg total) by mouth at bedtime.    calcium carbonate (OS-CAL - DOSED IN MG OF ELEMENTAL CALCIUM) 1250 (500 Ca) MG tablet Take 500 mg by mouth daily.    Cholecalciferol (VITAMIN D) 50 MCG (2000 UT) CAPS Take 1 tablet daily (Patient taking differently: Take 2,000 Units by mouth daily.)    diclofenac Sodium (VOLTAREN) 1 % GEL Apply 1 application topically 4 (four) times daily as needed (pain).    dofetilide (TIKOSYN) 125 MCG capsule Take 1 capsule (125 mcg total) by mouth 2 (two) times daily.    ferrous sulfate 325 (65 FE) MG EC tablet Take 325 mg by mouth daily. 01/16/2021: Last dose 01/06/2021. On hold for procedure.   furosemide (LASIX) 40 MG tablet Take 40  mg twice a day 4 days/week (Tuesday, Thursday, Saturday, Sunday) and take 40 mg  daily 3 days/week (Monday, Wednesday and Friday) (Patient taking differently: Take 40 mg by mouth See admin instructions. Take 40 mg twice a day 4 days/week (Tuesday, Thursday, Saturday, Sunday) and take 40 mg  daily 3 days/week (Monday, Wednesday and Friday))    gabapentin (NEURONTIN) 300 MG capsule Take 300 mg by mouth 2 (two) times daily.     levothyroxine (SYNTHROID) 100 MCG tablet Take 1 tablet (100 mcg total) by mouth daily before breakfast.    loratadine (CLARITIN) 10 MG tablet Take 10 mg by mouth daily.    magnesium oxide (MAG-OX) 400 MG tablet Take 0.5 tablets (200 mg total) by mouth daily.    metoprolol tartrate (LOPRESSOR) 25 MG tablet Take 25 mg by mouth 2 (two) times daily.    nitroGLYCERIN (NITROSTAT) 0.4 MG SL tablet DISSOLVE ONE TABLET UNDER THE TONGUE EVERY 5 MINUTES AS NEEDED FOR CHEST PAIN.  DO NOT EXCEED A TOTAL OF 3 DOSES IN 15 MINUTES (Patient taking differently: Place 0.4 mg under the tongue every 5 (five) minutes as needed for chest pain. DO NOT EXCEED A TOTAL OF 3 DOSES IN 15 MINUTES)    potassium chloride SA (KLOR-CON) 20 MEQ tablet Take 2 tablets (40 mEq total) by mouth daily. (Patient taking differently: Take 20 mEq by mouth See admin instructions. Take 20 mg twice a day 4 days/week (Tuesday, Thursday, Saturday, Sunday) and take 20 meq daily 3 days/week (Monday, Wednesday and Friday))    RANEXA 500 MG  12 hr tablet Take 1 tablet (500 mg total) by mouth 2 (two) times daily.    No facility-administered encounter medications on file as of 02/13/2021.   GEN CALL: Patient stated her colonoscopy went well she had five polyps and they were able to remove all of them. She stated she is still having black stools. Patient stated she eats red meat twice a week, as well as chicken. She stated she eat vegetables but does not eat much fruit. She stated she drinks one can of pepsi daily. She stated after her one  pepsi she drinks water throughout the day. She stated she does her daily house chores and cooks for herself. She stated she also gets out the house and is still driving. We talked about three medications (Eliquis, Tikosyn-Patient is taking the generic Rx, and  Ranexa-Patient is allergic to the generic brand)  that are too costly for Maria Fry and she stated she has already talked with over with the pharmacist Maria Fry and she knows she has to get some paperwork from the pharmacy before we can proceed with any patient assistance.   Star Rating Drugs: Atorvastatin 80 mg 1 tablet at bedtime 12/31/20 90 DS.   Follow-Up: Pharmacist Review  Charlann Lange, RMA Clinical Pharmacist Assistant [phone]  8 minutes spent in review, coordination, and documentation.  Reviewed by: Beverly Milch, PharmD Clinical Pharmacist [facility] [phone]

## 2021-02-20 NOTE — Telephone Encounter (Signed)
Patient informed. Copy sent to PCP °

## 2021-02-22 ENCOUNTER — Telehealth: Payer: Self-pay | Admitting: Pharmacist

## 2021-02-22 NOTE — Progress Notes (Signed)
ERROR

## 2021-03-01 ENCOUNTER — Telehealth: Payer: Self-pay | Admitting: Pharmacist

## 2021-03-01 DIAGNOSIS — G4733 Obstructive sleep apnea (adult) (pediatric): Secondary | ICD-10-CM | POA: Diagnosis not present

## 2021-03-01 NOTE — Progress Notes (Addendum)
    Chronic Care Management Pharmacy Assistant   Name: Maria Fry  MRN: [id number] DOB: Dec 06, 1950  Reason for Encounter: Adherence Review  Verified Adherence Gap Information. Per insurance data, the patient is 90-99% compliant with the CHOL (Atorvastatin) medication. Per insurance data patient has met their colorectal cancer screening. The patients met their annual wellness and wellness bundle screening.Their most recent A1C was 5.5 on 01/03/20. Their most recent blood pressure was 112/84 on 09/04/20 per insurance data. The patients total gaps-all measures is equal to 1.    Follow-Up:Pharmacist Review  Charlann Lange, [place] Clinical Pharmacist Assistant [phone]   Reviewed by: Beverly Milch, PharmD Clinical Pharmacist [place] [phone]

## 2021-03-02 DIAGNOSIS — E559 Vitamin D deficiency, unspecified: Secondary | ICD-10-CM | POA: Diagnosis not present

## 2021-03-02 DIAGNOSIS — Z7689 Persons encountering health services in other specified circumstances: Secondary | ICD-10-CM | POA: Diagnosis not present

## 2021-03-02 DIAGNOSIS — I10 Essential (primary) hypertension: Secondary | ICD-10-CM | POA: Diagnosis not present

## 2021-03-02 DIAGNOSIS — I214 Non-ST elevation (NSTEMI) myocardial infarction: Secondary | ICD-10-CM | POA: Diagnosis not present

## 2021-03-03 LAB — CBC WITH DIFFERENTIAL/PLATELET
Basophils Absolute: 0 10*3/uL (ref 0.0–0.2)
Basos: 0 %
EOS (ABSOLUTE): 0.2 10*3/uL (ref 0.0–0.4)
Eos: 2 %
Hematocrit: 34.1 % (ref 34.0–46.6)
Hemoglobin: 10.9 g/dL — ABNORMAL LOW (ref 11.1–15.9)
Immature Grans (Abs): 0 10*3/uL (ref 0.0–0.1)
Immature Granulocytes: 0 %
Lymphocytes Absolute: 2.5 10*3/uL (ref 0.7–3.1)
Lymphs: 33 %
MCH: 30.6 pg (ref 26.6–33.0)
MCHC: 32 g/dL (ref 31.5–35.7)
MCV: 96 fL (ref 79–97)
Monocytes Absolute: 0.5 10*3/uL (ref 0.1–0.9)
Monocytes: 7 %
Neutrophils Absolute: 4.3 10*3/uL (ref 1.4–7.0)
Neutrophils: 58 %
Platelets: 234 10*3/uL (ref 150–450)
RBC: 3.56 x10E6/uL — ABNORMAL LOW (ref 3.77–5.28)
RDW: 14.1 % (ref 11.7–15.4)
WBC: 7.5 10*3/uL (ref 3.4–10.8)

## 2021-03-03 LAB — LIPID PANEL WITH LDL/HDL RATIO
Cholesterol, Total: 128 mg/dL (ref 100–199)
HDL: 46 mg/dL (ref >39)
LDL Chol Calc (NIH): 71 mg/dL (ref 0–99)
LDL/HDL Ratio: 1.5 ratio (ref 0.0–3.2)
Triglycerides: 50 mg/dL (ref 0–149)
VLDL Cholesterol Cal: 11 mg/dL (ref 5–40)

## 2021-03-03 LAB — CMP14+EGFR
ALT: 10 IU/L (ref 0–32)
AST: 16 IU/L (ref 0–40)
Albumin/Globulin Ratio: 1.1 — ABNORMAL LOW (ref 1.2–2.2)
Albumin: 3.7 g/dL — ABNORMAL LOW (ref 3.8–4.8)
Alkaline Phosphatase: 93 IU/L (ref 44–121)
BUN/Creatinine Ratio: 14 (ref 12–28)
BUN: 14 mg/dL (ref 8–27)
Bilirubin Total: 0.4 mg/dL (ref 0.0–1.2)
CO2: 22 mmol/L (ref 20–29)
Calcium: 8.7 mg/dL (ref 8.7–10.3)
Chloride: 104 mmol/L (ref 96–106)
Creatinine, Ser: 1.01 mg/dL — ABNORMAL HIGH (ref 0.57–1.00)
Globulin, Total: 3.4 g/dL (ref 1.5–4.5)
Glucose: 94 mg/dL (ref 65–99)
Potassium: 4.4 mmol/L (ref 3.5–5.2)
Sodium: 139 mmol/L (ref 134–144)
Total Protein: 7.1 g/dL (ref 6.0–8.5)
eGFR: 61 mL/min/{1.73_m2} (ref >59)

## 2021-03-03 LAB — VITAMIN D 25 HYDROXY (VIT D DEFICIENCY, FRACTURES): Vit D, 25-Hydroxy: 25.5 ng/mL — ABNORMAL LOW (ref 30.0–100.0)

## 2021-03-03 LAB — MAGNESIUM: Magnesium: 2.1 mg/dL (ref 1.6–2.3)

## 2021-03-05 ENCOUNTER — Other Ambulatory Visit: Payer: Self-pay | Admitting: Nurse Practitioner

## 2021-03-05 MED ORDER — VITAMIN D (ERGOCALCIFEROL) 1.25 MG (50000 UNIT) PO CAPS: 50000.0000 [IU] | ORAL_CAPSULE | ORAL | 0 refills | Status: DC

## 2021-03-05 NOTE — Progress Notes (Signed)
Labs are stable. Vit D was a little low, so I sent in a weekly vit D supplement.

## 2021-03-06 ENCOUNTER — Ambulatory Visit (INDEPENDENT_AMBULATORY_CARE_PROVIDER_SITE_OTHER): Payer: PPO | Admitting: Nurse Practitioner

## 2021-03-06 ENCOUNTER — Encounter: Payer: Self-pay | Admitting: Nurse Practitioner

## 2021-03-06 ENCOUNTER — Other Ambulatory Visit: Payer: Self-pay

## 2021-03-06 VITALS — BP 152/89 | HR 70 | Temp 98.1°F | Resp 18 | Ht 60.0 in | Wt 214.0 lb

## 2021-03-06 DIAGNOSIS — E876 Hypokalemia: Secondary | ICD-10-CM

## 2021-03-06 DIAGNOSIS — I5042 Chronic combined systolic (congestive) and diastolic (congestive) heart failure: Secondary | ICD-10-CM

## 2021-03-06 DIAGNOSIS — E890 Postprocedural hypothyroidism: Secondary | ICD-10-CM | POA: Diagnosis not present

## 2021-03-06 DIAGNOSIS — Z1231 Encounter for screening mammogram for malignant neoplasm of breast: Secondary | ICD-10-CM | POA: Diagnosis not present

## 2021-03-06 DIAGNOSIS — R062 Wheezing: Secondary | ICD-10-CM

## 2021-03-06 DIAGNOSIS — E559 Vitamin D deficiency, unspecified: Secondary | ICD-10-CM | POA: Diagnosis not present

## 2021-03-06 DIAGNOSIS — I10 Essential (primary) hypertension: Secondary | ICD-10-CM

## 2021-03-06 NOTE — Assessment & Plan Note (Signed)
-  sent in Vit D yesterday as labs resulted

## 2021-03-06 NOTE — Assessment & Plan Note (Signed)
Lab Results  Component Value Date   K 4.4 03/02/2021   -doing well

## 2021-03-06 NOTE — Progress Notes (Signed)
Acute Office Visit  Subjective:    Patient ID: Maria Fry, female    DOB: 23-Nov-1951, 70 y.o.   MRN: [id number]  Chief Complaint  Patient presents with  . Follow-up    Go over labs, thyroid, magnesium, hgb.     HPI Patient is in today for lab follow-up for anemia.  She states that she has had some wheezing that started a couple of days ago over the weekend.  She states this is worse when she lays down at night, but it clears up. She states this is worse when her daily weights are up.  Wt Readings from Last 3 Encounters:  03/06/21 214 lb (97.1 kg)  02/06/21 217 lb 12.8 oz (98.8 kg)  01/30/21 218 lb (98.9 kg)     Past Medical History:  Diagnosis Date  . Arthritis    right knee  . Atrial fibrillation ([place])   . CAD (coronary artery disease)    a. 06/07/15 NSTEMI/Cath: Dist LAD 100%, mid LAD 10%. Otw nl cors. Nl EF w/ inferoapical AK.  Marland Kitchen Chronic combined systolic (congestive) and diastolic (congestive) heart failure ([place]) 04/23/2016   a. 04/2016 Echo: EF 45%, Gr2 DD; b. 11/2019 Echo: EF 45-50%, mod LVH. Gr2 DD. Nl RV fxn. Sev dil LA.   Marland Kitchen Complication of anesthesia   . Dysrhythmia    AFib  . Essential hypertension   . Hip pain   . Hyperlipidemia   . Hypothyroidism   . MI (myocardial infarction) ([place]) 06/05/2015  . PONV (postoperative nausea and vomiting)   . Vitamin D deficiency     Past Surgical History:  Procedure Laterality Date  . ABDOMINAL HYSTERECTOMY     partial- one ovary remaining  . BIOPSY  09/17/2020   Procedure: BIOPSY;  Surgeon: [name], DO;  [facility];  Service: Endoscopy;;  . CARDIAC CATHETERIZATION N/A 06/06/2015   Procedure: Left Heart Cath and Coronary Angiography;  Surgeon: Peter M Martinique, MD;  Location: [facility];  Service: Cardiovascular;  Laterality: N/A;  . CARDIOVERSION N/A 09/14/2020   Procedure: CARDIOVERSION;  Surgeon: Satira Sark, MD;  [facility]: [facility];  Service: Cardiovascular;  Laterality: N/A;   . CHOLECYSTECTOMY    . COLONOSCOPY    . COLONOSCOPY N/A 08/13/2017   Procedure: COLONOSCOPY;  Surgeon: Daneil Dolin, MD;  [facility];  Service: Endoscopy;  Laterality: N/A;  8:30 AM  . COLONOSCOPY WITH PROPOFOL N/A 01/18/2021   Procedure: COLONOSCOPY WITH PROPOFOL;  Surgeon: Daneil Dolin, MD;  [facility];  Service: Endoscopy;  Laterality: N/A;  11:00am  ASA 4  . ESOPHAGOGASTRODUODENOSCOPY N/A 09/11/2016   Procedure: ESOPHAGOGASTRODUODENOSCOPY (EGD);  Surgeon: Daneil Dolin, MD;  [facility];  Service: Endoscopy;  Laterality: N/A;  7:45 am - moved to 10/11 @ 10:30  . ESOPHAGOGASTRODUODENOSCOPY (EGD) WITH PROPOFOL N/A 09/17/2020   [name]: Diffuse moderate inflammation characterized by erosions and erythema found in the entire stomach.  Biopsies positive for H. pylori.  Patient has not been treated due to drug allergies.  Marland Kitchen Heel spurs Left   . MALONEY DILATION N/A 09/11/2016   Procedure: Venia Minks DILATION;  Surgeon: Daneil Dolin, MD;  [facility];  Service: Endoscopy;  Laterality: N/A;  . POLYPECTOMY  08/13/2017   Procedure: POLYPECTOMY;  Surgeon: Daneil Dolin, MD;  [facility];  Service: Endoscopy;;  colon  . POLYPECTOMY  01/18/2021   Procedure: POLYPECTOMY;  Surgeon: Daneil Dolin,  MD;  [facility];  Service: Endoscopy;;  . SHOULDER ARTHROSCOPY Right   . TUBAL LIGATION      Family History  Problem Relation Age of Onset  . Heart failure Father        Deceased  . Heart attack Father        Deceased  . Stroke Sister        Deceased  . Heart failure Brother        Deceased  . Cancer Brother        Deceased  . Heart attack Brother        Deceased  . Colon cancer Neg Hx     Social History   Socioeconomic History  . Marital status: Single    Spouse name: Not on file  . Number of children: Not on file  . Years of education: Not on file  . Highest education level: Not on file  Occupational History   . Not on file  Tobacco Use  . Smoking status: Former Smoker    Packs/day: 1.00    Years: 20.00    Pack years: 20.00    Types: Cigarettes    Start date: 12/02/1977    Quit date: 12/02/2002    Years since quitting: 18.2  . Smokeless tobacco: Never Used  Vaping Use  . Vaping Use: Never used  Substance and Sexual Activity  . Alcohol use: No    Alcohol/week: 0.0 standard drinks  . Drug use: No  . Sexual activity: Yes    Birth control/protection: Surgical  Other Topics Concern  . Not on file  Social History Narrative   Retired from [employer].   Social Determinants of Health   Financial Resource Strain: Low Risk   . Difficulty of Paying Living Expenses: Not very hard  Food Insecurity: Not on file  Transportation Needs: Not on file  Physical Activity: Not on file  Stress: Not on file  Social Connections: Not on file  Intimate Partner Violence: Not on file    Outpatient Medications Prior to Visit  Medication Sig Dispense Refill  . acetaminophen (TYLENOL) 500 MG tablet Take 500 mg by mouth every 6 (six) hours as needed for mild pain or moderate pain.    Marland Kitchen apixaban (ELIQUIS) 5 MG TABS tablet Take 1 tablet (5 mg total) by mouth 2 (two) times daily. 60 tablet 11  . atorvastatin (LIPITOR) 80 MG tablet Take 1 tablet (80 mg total) by mouth at bedtime. 90 tablet 3  . calcium carbonate (OS-CAL - DOSED IN MG OF ELEMENTAL CALCIUM) 1250 (500 Ca) MG tablet Take 500 mg by mouth daily.    . Cholecalciferol (VITAMIN D) 50 MCG (2000 UT) CAPS Take 1 tablet daily (Patient taking differently: Take 2,000 Units by mouth daily.) 30 capsule 2  . diclofenac Sodium (VOLTAREN) 1 % GEL Apply 1 application topically 4 (four) times daily as needed (pain).    Marland Kitchen dofetilide (TIKOSYN) 125 MCG capsule Take 1 capsule (125 mcg total) by mouth 2 (two) times daily. 60 capsule 6  . ferrous sulfate 325 (65 FE) MG EC tablet Take 325 mg by mouth daily.    . furosemide (LASIX) 40 MG tablet Take 40 mg twice a day 4 days/week  (Tuesday, Thursday, Saturday, Sunday) and take 40 mg  daily 3 days/week (Monday, Wednesday and Friday) (Patient taking differently: Take 40 mg by mouth See admin instructions. Take 40 mg twice a day 4 days/week (Tuesday, Thursday, Saturday, Sunday) and take 40 mg  daily  3 days/week (Monday, Wednesday and Friday)) 120 tablet 6  . gabapentin (NEURONTIN) 300 MG capsule Take 300 mg by mouth 2 (two) times daily.     Marland Kitchen levothyroxine (SYNTHROID) 100 MCG tablet Take 1 tablet (100 mcg total) by mouth daily before breakfast. 90 tablet 3  . loratadine (CLARITIN) 10 MG tablet Take 10 mg by mouth daily.    . magnesium oxide (MAG-OX) 400 MG tablet Take 0.5 tablets (200 mg total) by mouth daily. 30 tablet 6  . metoprolol tartrate (LOPRESSOR) 25 MG tablet Take 25 mg by mouth 2 (two) times daily.    . nitroGLYCERIN (NITROSTAT) 0.4 MG SL tablet DISSOLVE ONE TABLET UNDER THE TONGUE EVERY 5 MINUTES AS NEEDED FOR CHEST PAIN.  DO NOT EXCEED A TOTAL OF 3 DOSES IN 15 MINUTES (Patient taking differently: Place 0.4 mg under the tongue every 5 (five) minutes as needed for chest pain. DO NOT EXCEED A TOTAL OF 3 DOSES IN 15 MINUTES) 25 tablet 3  . potassium chloride SA (KLOR-CON) 20 MEQ tablet Take 2 tablets (40 mEq total) by mouth daily. (Patient taking differently: Take 20 mEq by mouth See admin instructions. Take 20 mg twice a day 4 days/week (Tuesday, Thursday, Saturday, Sunday) and take 20 meq daily 3 days/week (Monday, Wednesday and Friday)) 90 tablet 6  . RANEXA 500 MG 12 hr tablet Take 1 tablet (500 mg total) by mouth 2 (two) times daily. 60 tablet 6  . Vitamin D, Ergocalciferol, (DRISDOL) 1.25 MG (50000 UNIT) CAPS capsule Take 1 capsule (50,000 Units total) by mouth every 7 (seven) days. 12 capsule 0   No facility-administered medications prior to visit.    Allergies  Allergen Reactions  . Afrin [Oxymetazoline] Anaphylaxis  . Amoxicillin Anaphylaxis  . Lisinopril Anaphylaxis  . Ranolazine Anaphylaxis    Dizzy,  Reaction occurred with generic med  . Vicodin [Hydrocodone-Acetaminophen] Diarrhea and Nausea And Vomiting  . Demerol [Meperidine] Nausea And Vomiting  . Lodine [Etodolac] Rash    Review of Systems  Constitutional: Negative.   Respiratory: Positive for chest tightness and wheezing. Negative for cough and shortness of breath.   Cardiovascular: Negative.   Psychiatric/Behavioral: Negative.        Objective:    Physical Exam Constitutional:      Appearance: She is obese.  Cardiovascular:     Rate and Rhythm: Normal rate. Rhythm irregular.     Pulses: Normal pulses.     Heart sounds: Murmur heard.    Pulmonary:     Effort: Pulmonary effort is normal.     Breath sounds: Normal breath sounds.  Neurological:     Mental Status: She is alert.  Psychiatric:        Mood and Affect: Mood normal.        Behavior: Behavior normal.        Thought Content: Thought content normal.        Judgment: Judgment normal.     BP (!) 152/89   Pulse 70   Temp 98.1 F (36.7 C)   Resp 18   Ht 5' (1.524 m)   Wt 214 lb (97.1 kg)   SpO2 95%   BMI 41.79 kg/m  Wt Readings from Last 3 Encounters:  03/06/21 214 lb (97.1 kg)  02/06/21 217 lb 12.8 oz (98.8 kg)  01/30/21 218 lb (98.9 kg)    Health Maintenance Due  Topic Date Due  . MAMMOGRAM  02/02/2021    There are no preventive care reminders to display for this patient.  Lab Results  Component Value Date   TSH 4.010 01/10/2021   Lab Results  Component Value Date   WBC 7.5 03/02/2021   HGB 10.9 (L) 03/02/2021   HCT 34.1 03/02/2021   MCV 96 03/02/2021   PLT 234 03/02/2021   Lab Results  Component Value Date   NA 139 03/02/2021   K 4.4 03/02/2021   CO2 22 03/02/2021   GLUCOSE 94 03/02/2021   BUN 14 03/02/2021   CREATININE 1.01 (H) 03/02/2021   BILITOT 0.4 03/02/2021   ALKPHOS 93 03/02/2021   AST 16 03/02/2021   ALT 10 03/02/2021   PROT 7.1 03/02/2021   ALBUMIN 3.7 (L) 03/02/2021   CALCIUM 8.7 03/02/2021   ANIONGAP 6  01/16/2021   Lab Results  Component Value Date   CHOL 128 03/02/2021   Lab Results  Component Value Date   HDL 46 03/02/2021   Lab Results  Component Value Date   LDLCALC 71 03/02/2021   Lab Results  Component Value Date   TRIG 50 03/02/2021   Lab Results  Component Value Date   CHOLHDL 2.7 01/03/2020   Lab Results  Component Value Date   HGBA1C 5.5 01/03/2020       Assessment & Plan:   Problem List Items Addressed This Visit      Cardiovascular and Mediastinum   Hypertension    -BP elevated today -she will see Dr. Rayann Heman soon, will defer to him      Relevant Orders   CBC with Differential/Platelet   CMP14+EGFR   Lipid Panel With LDL/HDL Ratio   Chronic combined systolic and diastolic heart failure ([place]) - Primary    -her weight is lower than her previous Wt Readings from Last 3 Encounters:  03/06/21 214 lb (97.1 kg)  02/06/21 217 lb 12.8 oz (98.8 kg)  01/30/21 218 lb (98.9 kg)   -she states that she has some SOB that is worse when lying down and her breathing is worse when her weight is up; this is concerning for pulmonary edema, but today her lungs sound clear -we discussed that if she recognizes that her weight is up and she has difficulty breathing, she can call the office and we may let her take an extra laxis/potassium pill --she states she will call Dr. Rayann Heman instead      Relevant Orders   CBC with Differential/Platelet   CMP14+EGFR   Lipid Panel With LDL/HDL Ratio     Endocrine   Hypothyroidism following radioiodine therapy    Lab Results  Component Value Date   TSH 4.010 01/10/2021   -followed by Dr. [name] -takes levothyroxine        Other   Hypokalemia    Lab Results  Component Value Date   K 4.4 03/02/2021   -doing well      Relevant Orders   VITAMIN D 25 Hydroxy (Vit-D Deficiency, Fractures)   Vitamin D deficiency    -sent in Vit D yesterday as labs resulted      Wheezing    -she would like to try inhaler to help her  wheezing -insurance doesn't cover xopenex inhaler (hx of a-fib) -I tried sending in albuterol, but she has cross-sensitivities with anaphylaxis to oxymetazoline       Other Visit Diagnoses    Breast cancer screening by mammogram       Relevant Orders   MM Digital Screening   Hypomagnesemia       Relevant Orders   Magnesium  No orders of the defined types were placed in this encounter.    Noreene Larsson, NP

## 2021-03-06 NOTE — Assessment & Plan Note (Addendum)
-  her weight is lower than her previous Wt Readings from Last 3 Encounters:  03/06/21 214 lb (97.1 kg)  02/06/21 217 lb 12.8 oz (98.8 kg)  01/30/21 218 lb (98.9 kg)   -she states that she has some SOB that is worse when lying down and her breathing is worse when her weight is up; this is concerning for pulmonary edema, but today her lungs sound clear -we discussed that if she recognizes that her weight is up and she has difficulty breathing, she can call the office and we may let her take an extra laxis/potassium pill --she states she will call Dr. Rayann Heman instead

## 2021-03-06 NOTE — Patient Instructions (Signed)
Please have fasting labs drawn 2-3 days prior to your appointment so we can discuss the results during your office visit.  

## 2021-03-06 NOTE — Assessment & Plan Note (Addendum)
Lab Results  Component Value Date   TSH 4.010 01/10/2021   -followed by Dr. [name] -takes levothyroxine

## 2021-03-06 NOTE — Assessment & Plan Note (Signed)
-  she would like to try inhaler to help her wheezing -insurance doesn't cover xopenex inhaler (hx of a-fib) -I tried sending in albuterol, but she has cross-sensitivities with anaphylaxis to oxymetazoline

## 2021-03-06 NOTE — Assessment & Plan Note (Signed)
-  BP elevated today -she will see Dr. Rayann Heman soon, will defer to him

## 2021-03-08 ENCOUNTER — Telehealth (HOSPITAL_COMMUNITY): Payer: Self-pay | Admitting: *Deleted

## 2021-03-08 NOTE — Telephone Encounter (Signed)
Patient called in stating she has been feeling like she is back in AF for the last 2 days. Orthopnea, fatigue some chest tightness, lower extremity swelling. HR 77 BP 149/95.  Pt to take extra dose of lasix today and will see in clinic tomorrow. Pt to call if issues arise prior to appt tomorrow.

## 2021-03-09 ENCOUNTER — Ambulatory Visit (HOSPITAL_COMMUNITY)
Admission: RE | Admit: 2021-03-09 | Discharge: 2021-03-09 | Disposition: A | Discharge status: Home / Self Care | Payer: PPO | Source: Ambulatory Visit | Attending: Nurse Practitioner | Admitting: Nurse Practitioner

## 2021-03-09 ENCOUNTER — Other Ambulatory Visit: Payer: Self-pay

## 2021-03-09 ENCOUNTER — Encounter (HOSPITAL_COMMUNITY): Payer: Self-pay | Admitting: Nurse Practitioner

## 2021-03-09 VITALS — BP 128/72 | HR 62 | Ht 60.0 in | Wt 217.2 lb

## 2021-03-09 DIAGNOSIS — Z6841 Body Mass Index (BMI) 40.0 and over, adult: Secondary | ICD-10-CM | POA: Diagnosis not present

## 2021-03-09 DIAGNOSIS — E669 Obesity, unspecified: Secondary | ICD-10-CM | POA: Diagnosis not present

## 2021-03-09 DIAGNOSIS — Z79899 Other long term (current) drug therapy: Secondary | ICD-10-CM | POA: Insufficient documentation

## 2021-03-09 DIAGNOSIS — R0609 Other forms of dyspnea: Secondary | ICD-10-CM

## 2021-03-09 DIAGNOSIS — R0600 Dyspnea, unspecified: Secondary | ICD-10-CM

## 2021-03-09 DIAGNOSIS — Z87891 Personal history of nicotine dependence: Secondary | ICD-10-CM | POA: Diagnosis not present

## 2021-03-09 DIAGNOSIS — I083 Combined rheumatic disorders of mitral, aortic and tricuspid valves: Secondary | ICD-10-CM | POA: Insufficient documentation

## 2021-03-09 DIAGNOSIS — Z7901 Long term (current) use of anticoagulants: Secondary | ICD-10-CM | POA: Diagnosis not present

## 2021-03-09 DIAGNOSIS — I5042 Chronic combined systolic (congestive) and diastolic (congestive) heart failure: Secondary | ICD-10-CM | POA: Insufficient documentation

## 2021-03-09 DIAGNOSIS — I110 Hypertensive heart disease with heart failure: Secondary | ICD-10-CM | POA: Insufficient documentation

## 2021-03-09 DIAGNOSIS — I4819 Other persistent atrial fibrillation: Secondary | ICD-10-CM | POA: Insufficient documentation

## 2021-03-09 DIAGNOSIS — I2510 Atherosclerotic heart disease of native coronary artery without angina pectoris: Secondary | ICD-10-CM | POA: Insufficient documentation

## 2021-03-09 DIAGNOSIS — D6869 Other thrombophilia: Secondary | ICD-10-CM | POA: Diagnosis not present

## 2021-03-09 NOTE — Patient Instructions (Signed)
Lasix -- take 2 in the AM for 3 days then normal pattern of dosing Increase potassium 1 tablet twice a day for 3 days then normal pattern

## 2021-03-09 NOTE — Progress Notes (Signed)
Primary Care Physician: Noreene Larsson, NP Primary Cardiologist: Dr Bronson Ing (former) Primary Electrophysiologist: Dr Rayann Heman (new) Referring Physician: Otelia Sergeant is a 70 y.o. female with a history of CAD, chronic combined systolic and diastolic CHF, HLD, HTN, hypothyroidism, and persistent atrial fibrillation who presents for follow up in the [facility]. The patient was initially diagnosed with atrial fibrillation 08/18/20 at a routine follow up with Rosaria Ferries. She was having symptoms of increased fatigue. She was started on Eliquis for a CHADS2VASC score of 5. She underwent DCCV on 09/14/20. She was hospitalized 10/15-10/17/21 for acute CHF and black stools. Workup revealed gastritis and her Eliquis was temporarily held. She resumed 09/17/20. Her ASA was discontinued and she has not had recurrence of bleeding. She was back in afib on follow up 10/02/20. She reports that she did feel better in SR with more energy and less SOB.  Pt is s/p dofetilide loading 11/9-11/14/21. She converted to sinus rhythm after the first dose however, she was noted to have significant widening of QTC out to 551 ms. Dofetilide was held for 24 hours and subsequently resumed at 125 mcg twice daily after QT normalized. Patient reports that she feels very well today. Her fatigue and SOB are greatly improved. She denies any issues with dofetilide.   F/u in afib clinic, 03/09/21. She  called in yesterday, questioning if she was in afib as she felt more short of breath and her abdomen felt bloated. She was advised to take an extra lasix yesterday. In the office today, she is in rhythm. The extra lasix yesterday gave her some relief.  She has recently been  found to be more anemic and her iron RX was changed by PCP.   Today, she denies symptoms of palpitations, SOB, chest pain, orthopnea, PND, dizziness, presyncope, syncope, bleeding, or neurologic sequela. The patient is  tolerating medications without difficulties and is otherwise without complaint today.    Atrial Fibrillation Risk Factors:  she does have symptoms or diagnosis of sleep apnea. Sleep consult with Dr Halford Chessman pending. she does not have a history of rheumatic fever.   she has a BMI of Body mass index is 42.42 kg/m.Marland Kitchen Filed Weights   03/09/21 1420  Weight: 98.5 kg    Family History  Problem Relation Age of Onset  . Heart failure Father        Deceased  . Heart attack Father        Deceased  . Stroke Sister        Deceased  . Heart failure Brother        Deceased  . Cancer Brother        Deceased  . Heart attack Brother        Deceased  . Colon cancer Neg Hx      Atrial Fibrillation Management history:  Previous antiarrhythmic drugs: dofetilide  Previous cardioversions: 09/14/20 Previous ablations: none CHADS2VASC score: 5 Anticoagulation history: Eliquis   Past Medical History:  Diagnosis Date  . Arthritis    right knee  . Atrial fibrillation ([place])   . CAD (coronary artery disease)    a. 06/07/15 NSTEMI/Cath: Dist LAD 100%, mid LAD 10%. Otw nl cors. Nl EF w/ inferoapical AK.  Marland Kitchen Chronic combined systolic (congestive) and diastolic (congestive) heart failure ([place]) 04/23/2016   a. 04/2016 Echo: EF 45%, Gr2 DD; b. 11/2019 Echo: EF 45-50%, mod LVH. Gr2 DD. Nl RV fxn. Sev dil LA.   Marland Kitchen  Complication of anesthesia   . Dysrhythmia    AFib  . Essential hypertension   . Hip pain   . Hyperlipidemia   . Hypothyroidism   . MI (myocardial infarction) ([place]) 06/05/2015  . PONV (postoperative nausea and vomiting)   . Vitamin D deficiency    Past Surgical History:  Procedure Laterality Date  . ABDOMINAL HYSTERECTOMY     partial- one ovary remaining  . BIOPSY  09/17/2020   Procedure: BIOPSY;  Surgeon: [name], DO;  [facility];  Service: Endoscopy;;  . CARDIAC CATHETERIZATION N/A 06/06/2015   Procedure: Left Heart Cath and Coronary Angiography;  Surgeon: Peter  M Martinique, MD;  Location: [facility];  Service: Cardiovascular;  Laterality: N/A;  . CARDIOVERSION N/A 09/14/2020   Procedure: CARDIOVERSION;  Surgeon: Satira Sark, MD;  [facility];  Service: Cardiovascular;  Laterality: N/A;  . CHOLECYSTECTOMY    . COLONOSCOPY    . COLONOSCOPY N/A 08/13/2017   Procedure: COLONOSCOPY;  Surgeon: Daneil Dolin, MD;  [facility];  Service: Endoscopy;  Laterality: N/A;  8:30 AM  . COLONOSCOPY WITH PROPOFOL N/A 01/18/2021   Procedure: COLONOSCOPY WITH PROPOFOL;  Surgeon: Daneil Dolin, MD;  [facility];  Service: Endoscopy;  Laterality: N/A;  11:00am  ASA 4  . ESOPHAGOGASTRODUODENOSCOPY N/A 09/11/2016   Procedure: ESOPHAGOGASTRODUODENOSCOPY (EGD);  Surgeon: Daneil Dolin, MD;  [facility];  Service: Endoscopy;  Laterality: N/A;  7:45 am - moved to 10/11 @ 10:30  . ESOPHAGOGASTRODUODENOSCOPY (EGD) WITH PROPOFOL N/A 09/17/2020   [name]: Diffuse moderate inflammation characterized by erosions and erythema found in the entire stomach.  Biopsies positive for H. pylori.  Patient has not been treated due to drug allergies.  Marland Kitchen Heel spurs Left   . MALONEY DILATION N/A 09/11/2016   Procedure: Venia Minks DILATION;  Surgeon: Daneil Dolin, MD;  [facility];  Service: Endoscopy;  Laterality: N/A;  . POLYPECTOMY  08/13/2017   Procedure: POLYPECTOMY;  Surgeon: Daneil Dolin, MD;  [facility];  Service: Endoscopy;;  colon  . POLYPECTOMY  01/18/2021   Procedure: POLYPECTOMY;  Surgeon: Daneil Dolin, MD;  [facility];  Service: Endoscopy;;  . SHOULDER ARTHROSCOPY Right   . TUBAL LIGATION      Current Outpatient Medications  Medication Sig Dispense Refill  . apixaban (ELIQUIS) 5 MG TABS tablet Take 1 tablet (5 mg total) by mouth 2 (two) times daily. 60 tablet 11  . atorvastatin (LIPITOR) 80 MG tablet Take 1 tablet (80 mg total) by mouth at bedtime. 90 tablet 3  . calcium  carbonate (OS-CAL - DOSED IN MG OF ELEMENTAL CALCIUM) 1250 (500 Ca) MG tablet Take 500 mg by mouth daily.    . Cholecalciferol (VITAMIN D) 50 MCG (2000 UT) CAPS Take 1 tablet daily (Patient taking differently: Take 2,000 Units by mouth daily.) 30 capsule 2  . diclofenac Sodium (VOLTAREN) 1 % GEL Apply 1 application topically 4 (four) times daily as needed (pain).    Marland Kitchen dofetilide (TIKOSYN) 125 MCG capsule Take 1 capsule (125 mcg total) by mouth 2 (two) times daily. 60 capsule 6  . ferrous sulfate 325 (65 FE) MG EC tablet Take 325 mg by mouth daily.    . furosemide (LASIX) 40 MG tablet Take 40 mg twice a day 4 days/week (Tuesday, Thursday, Saturday, Sunday) and take 40 mg  daily 3 days/week (Monday, Wednesday and Friday) (Patient taking differently: Take  40 mg by mouth See admin instructions. Take 40 mg twice a day 4 days/week (Tuesday, Thursday, Saturday, Sunday) and take 40 mg  daily 3 days/week (Monday, Wednesday and Friday)) 120 tablet 6  . gabapentin (NEURONTIN) 300 MG capsule Take 300 mg by mouth 2 (two) times daily.     Marland Kitchen levothyroxine (SYNTHROID) 100 MCG tablet Take 1 tablet (100 mcg total) by mouth daily before breakfast. 90 tablet 3  . loratadine (CLARITIN) 10 MG tablet Take 10 mg by mouth daily.    . magnesium oxide (MAG-OX) 400 MG tablet Take 0.5 tablets (200 mg total) by mouth daily. 30 tablet 6  . metoprolol tartrate (LOPRESSOR) 25 MG tablet Take 25 mg by mouth 2 (two) times daily.    . potassium chloride SA (KLOR-CON) 20 MEQ tablet Take 2 tablets (40 mEq total) by mouth daily. (Patient taking differently: Take 20 mEq by mouth See admin instructions. Take 20 mg twice a day 4 days/week (Tuesday, Thursday, Saturday, Sunday) and take 20 meq daily 3 days/week (Monday, Wednesday and Friday)) 90 tablet 6  . RANEXA 500 MG 12 hr tablet Take 1 tablet (500 mg total) by mouth 2 (two) times daily. 60 tablet 6  . Vitamin D, Ergocalciferol, (DRISDOL) 1.25 MG (50000 UNIT) CAPS capsule Take 1 capsule  (50,000 Units total) by mouth every 7 (seven) days. 12 capsule 0  . acetaminophen (TYLENOL) 500 MG tablet Take 500 mg by mouth every 6 (six) hours as needed for mild pain or moderate pain.    . nitroGLYCERIN (NITROSTAT) 0.4 MG SL tablet DISSOLVE ONE TABLET UNDER THE TONGUE EVERY 5 MINUTES AS NEEDED FOR CHEST PAIN.  DO NOT EXCEED A TOTAL OF 3 DOSES IN 15 MINUTES (Patient not taking: Reported on 03/09/2021) 25 tablet 3   No current facility-administered medications for this encounter.    Allergies  Allergen Reactions  . Afrin [Oxymetazoline] Anaphylaxis  . Amoxicillin Anaphylaxis  . Lisinopril Anaphylaxis  . Ranolazine Anaphylaxis    Dizzy, Reaction occurred with generic med  . Vicodin [Hydrocodone-Acetaminophen] Diarrhea and Nausea And Vomiting  . Demerol [Meperidine] Nausea And Vomiting  . Lodine [Etodolac] Rash    Social History   Socioeconomic History  . Marital status: Single    Spouse name: Not on file  . Number of children: Not on file  . Years of education: Not on file  . Highest education level: Not on file  Occupational History  . Not on file  Tobacco Use  . Smoking status: Former Smoker    Packs/day: 1.00    Years: 20.00    Pack years: 20.00    Types: Cigarettes    Start date: 12/02/1977    Quit date: 12/02/2002    Years since quitting: 18.2  . Smokeless tobacco: Never Used  Vaping Use  . Vaping Use: Never used  Substance and Sexual Activity  . Alcohol use: No    Alcohol/week: 0.0 standard drinks  . Drug use: No  . Sexual activity: Yes    Birth control/protection: Surgical  Other Topics Concern  . Not on file  Social History Narrative   Retired from [employer].   Social Determinants of Health   Financial Resource Strain: Low Risk   . Difficulty of Paying Living Expenses: Not very hard  Food Insecurity: Not on file  Transportation Needs: Not on file  Physical Activity: Not on file  Stress: Not on file  Social Connections: Not on file  Intimate Partner  Violence: Not on file  ROS- All systems are reviewed and negative except as per the HPI above.  Physical Exam: Vitals:   03/09/21 1420  BP: 128/72  Pulse: 62  Weight: 98.5 kg  Height: 5' (1.524 m)    GEN- The patient is well appearing obese female, alert and oriented x 3 today.   HEENT-head normocephalic, atraumatic, sclera clear, conjunctiva pink, hearing intact, trachea midline. Lungs- Clear to ausculation bilaterally, normal work of breathing Heart- Regular rate and rhythm, no murmurs, rubs or gallops  GI- soft, NT, ND, + BS Extremities- no clubbing, cyanosis, or edema MS- no significant deformity or atrophy Skin- no rash or lesion Psych- euthymic mood, full affect Neuro- strength and sensation are intact   Wt Readings from Last 3 Encounters:  03/09/21 98.5 kg  03/06/21 97.1 kg  02/06/21 98.8 kg    EKG today demonstrates SR HR 62,  PR 184, QRS 98, QTc 462  Echo 08/29/20 demonstrated  1. Left ventricular ejection fraction, by estimation, is 40 to 45%. The  left ventricle has mildly decreased function. The left ventricle  demonstrates global hypokinesis. There is severe left ventricular  hypertrophy. Left ventricular diastolic parameters  are indeterminate.  2. Right ventricular systolic function is normal. The right ventricular  size is normal. There is moderately elevated pulmonary artery systolic  pressure.  3. Left atrial size was severely dilated.  4. Right atrial size was severely dilated.  5. The mitral valve is normal in structure. Mild to moderate mitral valve  regurgitation. No evidence of mitral stenosis.  6. Tricuspid valve regurgitation is moderate.  7. The aortic valve is tricuspid. Aortic valve regurgitation is mild to  moderate.  8. Moderate pulmonary HTN, PASP is 50 mmHg.  9. The inferior vena cava is dilated in size with <50% respiratory  variability, suggesting right atrial pressure of 15 mmHg.   Epic records are reviewed at length  today  CHA2DS2-VASc Score = 5  The patient's score is based upon: CHF History: Yes HTN History: Yes Diabetes History: No Stroke History: No Vascular Disease History: Yes Age Score: 1 Gender Score: 1      ASSESSMENT AND PLAN: 1. Persistent Atrial Fibrillation (ICD10:  I48.19) The patient's CHA2DS2-VASc score is 5, indicating a 7.2% annual risk of stroke.   S/p dofetilide loading 11/9-11/14/21 Patient appears to be maintaining SR. Continue dofetilide 125 mcg BID. QT stable. Continue Eliquis 5 mg BID Continue Lopressor 25 mg BID  2. Secondary Hypercoagulable State (ICD10:  D68.69) The patient is at significant risk for stroke/thromboembolism based upon her CHA2DS2-VASc Score of 5.  Continue Apixaban (Eliquis).   3. Obesity Body mass index is 42.42 kg/m. Lifestyle modification was discussed and encouraged including regular physical activity and weight reduction.  4. Suspected obstructive sleep apnea Sleep consult pending.  5. Chronic combined systolic and diastolic CHF Recent increase in shortness of breath and abdomen bloating  Suspect extra fluid, wt is up a few lbs and she obtained some relief from an extra lasix yesterday Symptoms may be driven from  worsening anemia, PCP has addressed  Will increase lasix to 20 mg bid x 3 days, then return to normal dosing    6. CAD No anginal symptoms.  Should not have up titration of Ranexa 2/2 QT.  7. HTN Stable, no changes today.   Follow up with Adline Peals, PA as scheduled 4/26, I offered to move up appointment to next week, she would prefer to keep it the same,  but will call the office if symptoms  do not improve.    Geroge Baseman Tommye Lehenbauer, [facility] [address] [place], [place] [address] [phone]

## 2021-03-11 IMAGING — DX DG CHEST 1V PORT
1 series · 1 of 1 positions shown · non-contrast
Comparison: Chest radiograph dated 08/15/2009 and CT dated
01/14/2020.

CLINICAL DATA: 69-year-old female with acute dyspnea.

EXAM:
PORTABLE CHEST 1 VIEW

[chest ap]
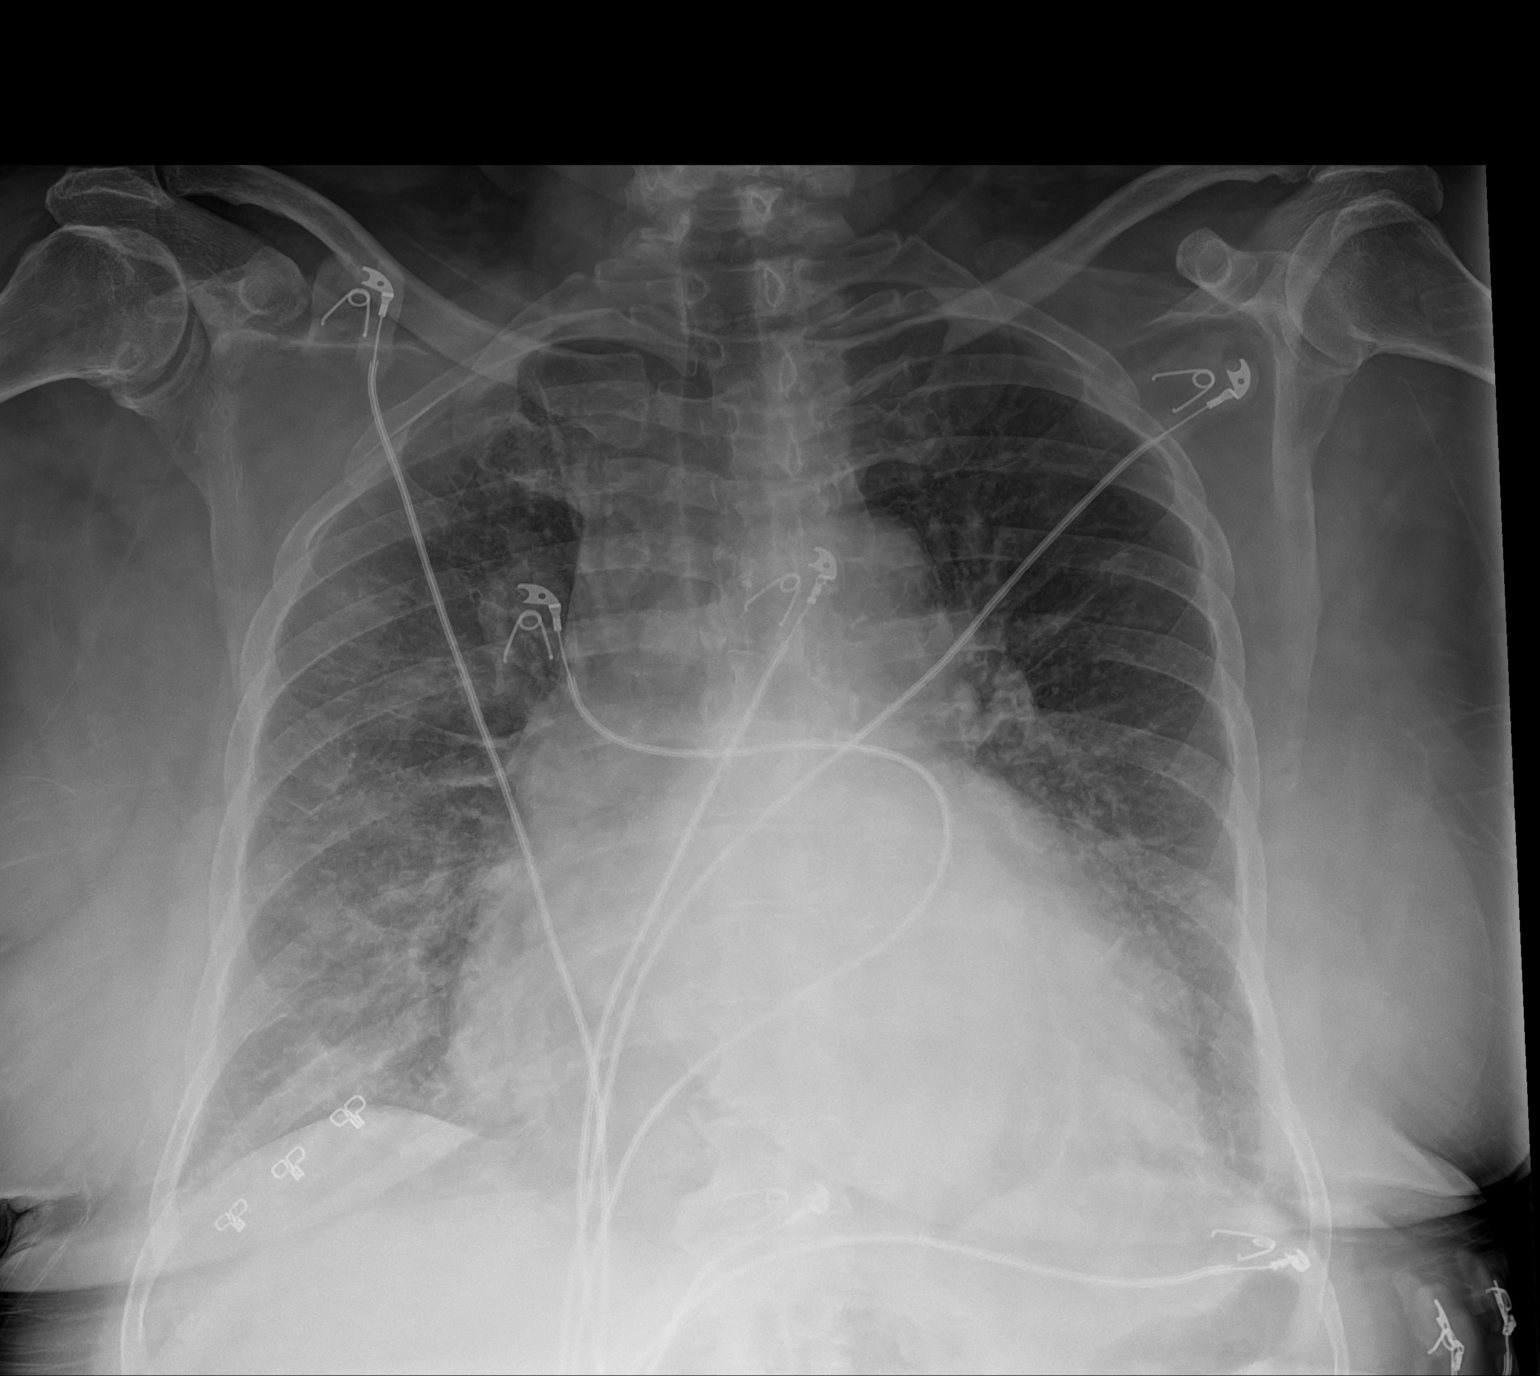

[1 of 1 positions shown; findings below may reference images not displayed]

FINDINGS: There is cardiomegaly with vascular congestion and mild edema.
Pneumonia is not excluded clinical correlation is recommended. No
lobar consolidation, pleural effusion, pneumothorax. No acute
osseous pathology.
IMPRESSION: Cardiomegaly with findings of mild CHF.  Pneumonia is not excluded.

## 2021-03-12 IMAGING — DX DG CHEST 1V PORT
1 series · 1 of 1 positions shown · non-contrast
Comparison: 09/15/2020

CLINICAL DATA: Diarrhea, CHF exacerbation

EXAM:
PORTABLE CHEST 1 VIEW

[chest ap]
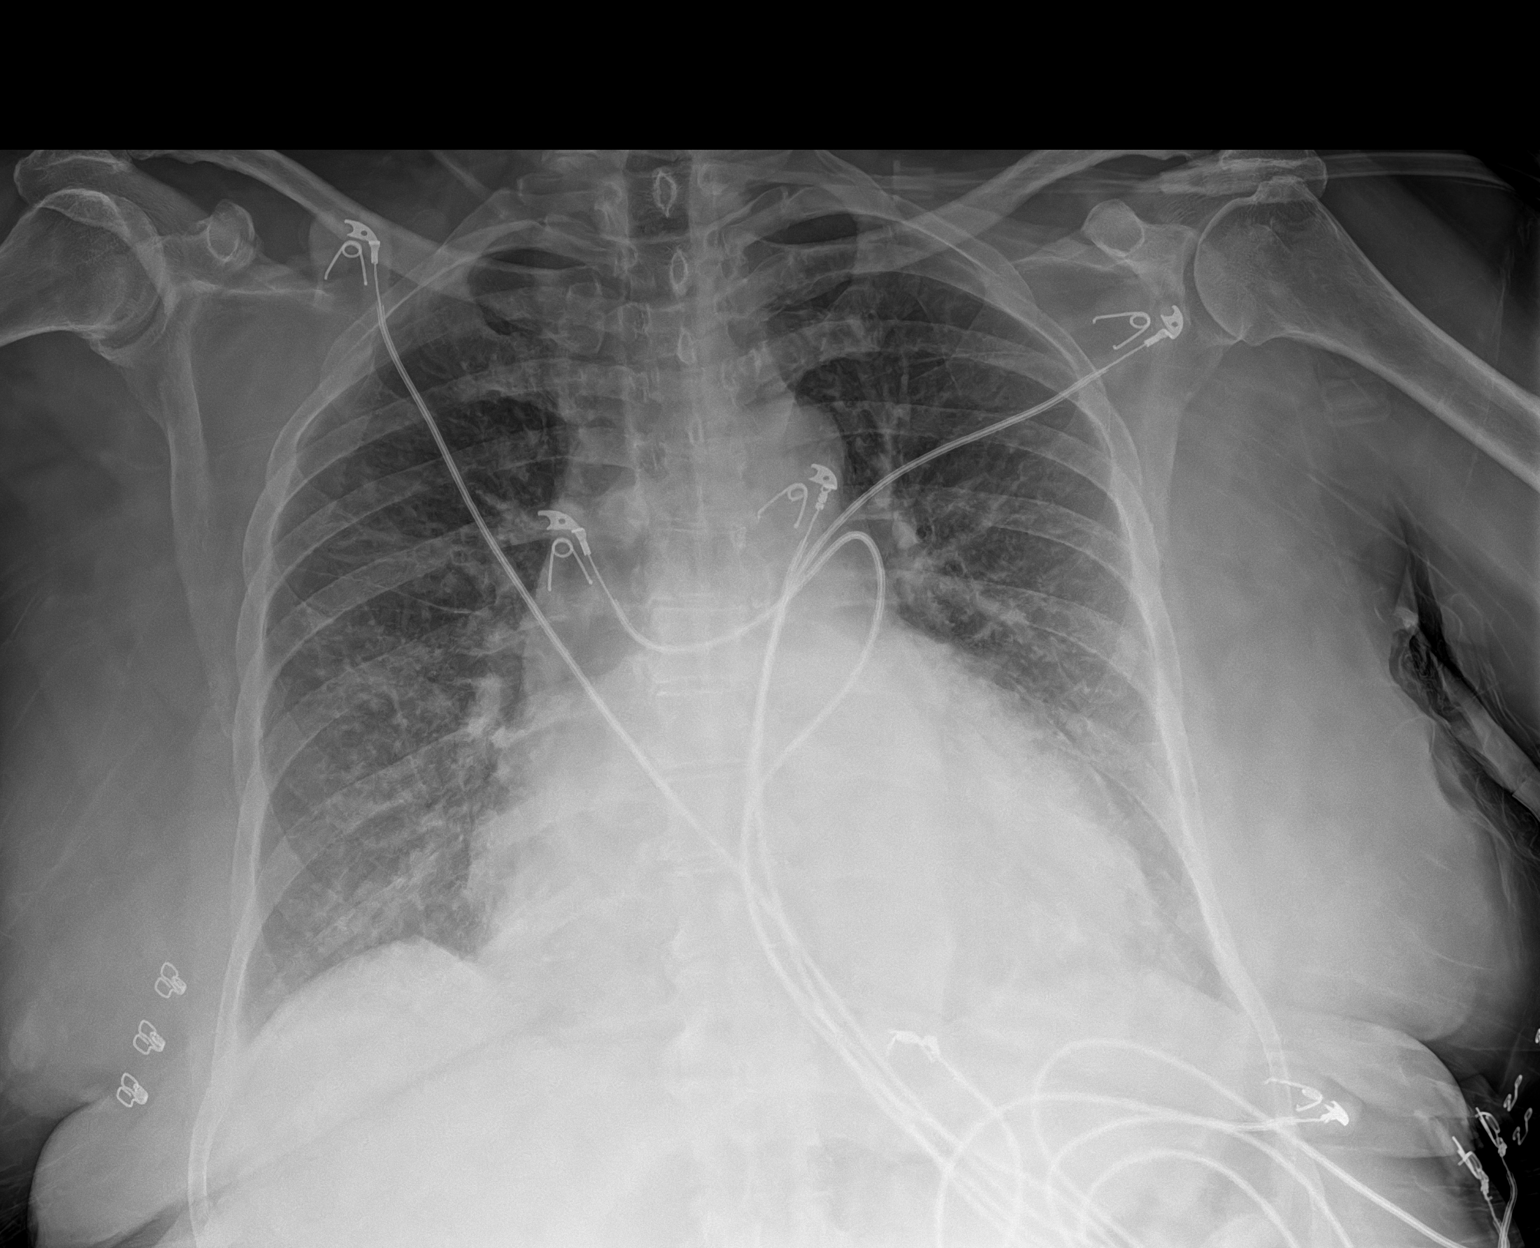

[1 of 1 positions shown; findings below may reference images not displayed]

FINDINGS: Cardiomegaly with pulmonary vascular congestion. No frank
interstitial edema, improved. No pleural effusion or pneumothorax.
IMPRESSION: Cardiomegaly with pulmonary vascular congestion. No frank
interstitial edema, improved.

## 2021-03-20 ENCOUNTER — Other Ambulatory Visit: Payer: Self-pay

## 2021-03-20 ENCOUNTER — Ambulatory Visit: Payer: PPO | Admitting: Gastroenterology

## 2021-03-20 ENCOUNTER — Encounter: Payer: Self-pay | Admitting: Gastroenterology

## 2021-03-20 VITALS — BP 126/71 | HR 55 | Temp 95.9°F | Ht 60.0 in | Wt 219.6 lb

## 2021-03-20 DIAGNOSIS — D508 Other iron deficiency anemias: Secondary | ICD-10-CM | POA: Diagnosis not present

## 2021-03-20 NOTE — Patient Instructions (Signed)
1. Please have labs done in 06/2021 to follow up on your anemia/iron. 2. Your next colonoscopy will be in 01/2024.  3. Return to the office in one year.

## 2021-03-20 NOTE — Progress Notes (Signed)
Primary Care Physician: Noreene Larsson, NP  Primary Gastroenterologist:  Garfield Cornea, MD   Chief Complaint  Patient presents with  . Anemia    Pp f/u. Doing ok    HPI: Maria Fry is a 70 y.o. female here for follow-up.  She was seen in the office back in November 2021 for history of hospitalization with melena in the setting of Eliquis for A. Fib and IDA.  Had been on Goody powders prior to starting Eliquis earlier last year.  History of colonoscopy in 2018 with few small tubular adenomas, 1 of which contained focal high-grade dysplasia.  While inpatient she completed upper endoscopy that showed H. pylori gastritis.  She has been treated, confirmed eradication via stool antigen testing.  Patient completed colonoscopy in February 2025, she had multiple tubular adenomas removed, plans for next colonoscopy in 3 years.  Recent labs with Hgb 10.9 and Hct 34.1. Hgb stable. Last ferritin 3 months ago up to 20 from 10. She continues iron daily. Recently started on weekly vitamin D for vitamin D deficiency.  BM regular. No melena, brbpr. No heartburn. Occasional abdominal pain related to certain foods.  No reflux.  Overall feels well.  Scheduled for next labs in 6 months.  Continues Eliquis twice daily.  Current Outpatient Medications  Medication Sig Dispense Refill  . acetaminophen (TYLENOL) 500 MG tablet Take 500 mg by mouth every 6 (six) hours as needed for mild pain or moderate pain.    Marland Kitchen apixaban (ELIQUIS) 5 MG TABS tablet Take 1 tablet (5 mg total) by mouth 2 (two) times daily. 60 tablet 11  . atorvastatin (LIPITOR) 80 MG tablet Take 1 tablet (80 mg total) by mouth at bedtime. 90 tablet 3  . calcium carbonate (OS-CAL - DOSED IN MG OF ELEMENTAL CALCIUM) 1250 (500 Ca) MG tablet Take 500 mg by mouth daily.    . Cholecalciferol (VITAMIN D) 50 MCG (2000 UT) CAPS Take 1 tablet daily (Patient taking differently: Take 2,000 Units by mouth daily.) 30 capsule 2  . diclofenac Sodium  (VOLTAREN) 1 % GEL Apply 1 application topically 4 (four) times daily as needed (pain).    Marland Kitchen dofetilide (TIKOSYN) 125 MCG capsule Take 1 capsule (125 mcg total) by mouth 2 (two) times daily. 60 capsule 6  . ferrous sulfate 325 (65 FE) MG EC tablet Take 325 mg by mouth daily.    . furosemide (LASIX) 40 MG tablet Take 40 mg twice a day 4 days/week (Tuesday, Thursday, Saturday, Sunday) and take 40 mg  daily 3 days/week (Monday, Wednesday and Friday) (Patient taking differently: Take 40 mg by mouth See admin instructions. Take 40 mg twice a day 4 days/week (Tuesday, Thursday, Saturday, Sunday) and take 40 mg  daily 3 days/week (Monday, Wednesday and Friday)) 120 tablet 6  . gabapentin (NEURONTIN) 300 MG capsule Take 300 mg by mouth 2 (two) times daily.     Marland Kitchen levothyroxine (SYNTHROID) 100 MCG tablet Take 1 tablet (100 mcg total) by mouth daily before breakfast. 90 tablet 3  . loratadine (CLARITIN) 10 MG tablet Take 10 mg by mouth daily.    . magnesium oxide (MAG-OX) 400 MG tablet Take 0.5 tablets (200 mg total) by mouth daily. 30 tablet 6  . metoprolol tartrate (LOPRESSOR) 25 MG tablet Take 25 mg by mouth 2 (two) times daily.    . nitroGLYCERIN (NITROSTAT) 0.4 MG SL tablet DISSOLVE ONE TABLET UNDER THE TONGUE EVERY 5 MINUTES AS NEEDED FOR CHEST PAIN.  DO  NOT EXCEED A TOTAL OF 3 DOSES IN 15 MINUTES (Patient taking differently: DISSOLVE ONE TABLET UNDER THE TONGUE EVERY 5 MINUTES AS NEEDED FOR CHEST PAIN.  DO NOT EXCEED A TOTAL OF 3 DOSES IN 15 MINUTES) 25 tablet 3  . potassium chloride SA (KLOR-CON) 20 MEQ tablet Take 2 tablets (40 mEq total) by mouth daily. (Patient taking differently: Take 20 mEq by mouth See admin instructions. Take 20 mg twice a day 4 days/week (Tuesday, Thursday, Saturday, Sunday) and take 20 meq daily 3 days/week (Monday, Wednesday and Friday)) 90 tablet 6  . RANEXA 500 MG 12 hr tablet Take 1 tablet (500 mg total) by mouth 2 (two) times daily. 60 tablet 6  . Vitamin D, Ergocalciferol,  (DRISDOL) 1.25 MG (50000 UNIT) CAPS capsule Take 1 capsule (50,000 Units total) by mouth every 7 (seven) days. 12 capsule 0   No current facility-administered medications for this visit.    Allergies as of 03/20/2021 - Review Complete 03/20/2021  Allergen Reaction Noted  . Afrin [oxymetazoline] Anaphylaxis 01/15/2021  . Amoxicillin Anaphylaxis 06/01/2018  . Lisinopril Anaphylaxis 06/13/2015  . Ranolazine Anaphylaxis 09/04/2020  . Vicodin [hydrocodone-acetaminophen] Diarrhea and Nausea And Vomiting 04/25/2016  . Demerol [meperidine] Nausea And Vomiting 01/17/2013  . Lodine [etodolac] Rash 01/17/2013    ROS:  General: Negative for anorexia, weight loss, fever, chills, fatigue, weakness. ENT: Negative for hoarseness, difficulty swallowing , nasal congestion. CV: Negative for chest pain, angina, palpitations, dyspnea on exertion, peripheral edema.  Respiratory: Negative for dyspnea at rest, dyspnea on exertion, cough, sputum, wheezing.  GI: See history of present illness. GU:  Negative for dysuria, hematuria, urinary incontinence, urinary frequency, nocturnal urination.  Endo: Negative for unusual weight change.    Physical Examination:   BP 126/71   Pulse (!) 55   Temp (!) 95.9 F (35.5 C) (Temporal)   Ht 5' (1.524 m)   Wt 219 lb 9.6 oz (99.6 kg)   BMI 42.89 kg/m   General: Well-nourished, well-developed in no acute distress.  Eyes: No icterus. Mouth: masked Abdomen: Bowel sounds are normal, nontender, nondistended, no hepatosplenomegaly or masses, no abdominal bruits or hernia , no rebound or guarding.   Extremities: No lower extremity edema. No clubbing or deformities. Neuro: Alert and oriented x 4   Skin: Warm and dry, no jaundice.   Psych: Alert and cooperative, normal mood and affect.  Labs:  Lab Results  Component Value Date   CREATININE 1.01 (H) 03/02/2021   BUN 14 03/02/2021   NA 139 03/02/2021   K 4.4 03/02/2021   CL 104 03/02/2021   CO2 22 03/02/2021    Lab Results  Component Value Date   ALT 10 03/02/2021   AST 16 03/02/2021   ALKPHOS 93 03/02/2021   BILITOT 0.4 03/02/2021   Lab Results  Component Value Date   WBC 7.5 03/02/2021   HGB 10.9 (L) 03/02/2021   HCT 34.1 03/02/2021   MCV 96 03/02/2021   PLT 234 03/02/2021   Lab Results  Component Value Date   IRON 49 11/30/2020   TIBC 298 11/30/2020   FERRITIN 20 11/30/2020   Lab Results  Component Value Date   [id number] 324 08/23/2020   No results found for: FOLATE   Imaging Studies: No results found.  Assessment/plan:  Pleasant 70 year old female with history of iron deficiency anemia, melena, chronic anticoagulation with Eliquis presenting for follow-up.  EGD showed H. pylori gastritis, treated and confirmed eradication.  Colonoscopy with multiple adenomatous colon polyps.  Denies any ongoing  overt GI bleeding.  Hemoglobin mildly low at 10.9, stable.  Overall clinically stable.  Discussed with patient, if she has recurrent IDA, decline in H&H, will consider completing GI work-up with small bowel capsule endoscopy.   1. Would suggest monitoring hemoglobin regularly given ongoing Eliquis use and history of GI blood loss.  We will recheck in 3 months.CBC, iron/TIBC/ferritin in July 2022.  2. Next colonoscopy March 2025 for history of adenomatous colon polyps. 3. Continue daily iron. 4. Return to the office in 1 year or call sooner if needed.

## 2021-03-26 NOTE — Progress Notes (Signed)
Primary Care Physician: Noreene Larsson, NP Primary Cardiologist: Dr Bronson Ing (formerlly) Primary Electrophysiologist: Dr Rayann Heman Referring Physician: Levell July   Maria Fry is a 70 y.o. female with a history of CAD, chronic combined systolic and diastolic CHF, HLD, HTN, OSA, hypothyroidism, and persistent atrial fibrillation who presents for follow up in the [facility]. The patient was initially diagnosed with atrial fibrillation 08/18/20 at a routine follow up with Rosaria Ferries. She was having symptoms of increased fatigue. She was started on Eliquis for a CHADS2VASC score of 5. She underwent DCCV on 09/14/20. She was hospitalized 10/15-10/17/21 for acute CHF and black stools. Workup revealed gastritis and her Eliquis was temporarily held. She resumed 09/17/20. Her ASA was discontinued and she has not had recurrence of bleeding. She was back in afib on follow up 10/02/20. She reports that she did feel better in SR with more energy and less SOB.  Pt is s/p dofetilide loading 11/9-11/14/21. She converted to sinus rhythm after the first dose however, she was noted to have significant widening of QTC out to 551 ms. Dofetilide was held for 24 hours and subsequently resumed at 125 mcg twice daily after QT normalized. She has been diagnosed with OSA.  On follow up today, patient reports she has done reasonably well. The extra doses of Lasix did help her lower extremity edema but it came back once she decreased to her regular dosing. She remains in SR. She denies any bleeding issues on anticoagulation. She has started using her CPAP machine.   Today, she denies symptoms of palpitations, SOB, chest pain, orthopnea, PND, dizziness, presyncope, syncope, bleeding, or neurologic sequela. The patient is tolerating medications without difficulties and is otherwise without complaint today.    Atrial Fibrillation Risk Factors:  she does have symptoms or diagnosis of sleep  apnea. Patient reports compliance with CPAP. she does not have a history of rheumatic fever.   she has a BMI of Body mass index is 43.59 kg/m.Marland Kitchen Filed Weights   03/27/21 1021  Weight: 101.2 kg    Family History  Problem Relation Age of Onset  . Heart failure Father        Deceased  . Heart attack Father        Deceased  . Stroke Sister        Deceased  . Heart failure Brother        Deceased  . Cancer Brother        Deceased  . Heart attack Brother        Deceased  . Colon cancer Neg Hx      Atrial Fibrillation Management history:  Previous antiarrhythmic drugs: dofetilide  Previous cardioversions: 09/14/20 Previous ablations: none CHADS2VASC score: 5 Anticoagulation history: Eliquis   Past Medical History:  Diagnosis Date  . Arthritis    right knee  . Atrial fibrillation ([place])   . CAD (coronary artery disease)    a. 06/07/15 NSTEMI/Cath: Dist LAD 100%, mid LAD 10%. Otw nl cors. Nl EF w/ inferoapical AK.  Marland Kitchen Chronic combined systolic (congestive) and diastolic (congestive) heart failure ([place]) 04/23/2016   a. 04/2016 Echo: EF 45%, Gr2 DD; b. 11/2019 Echo: EF 45-50%, mod LVH. Gr2 DD. Nl RV fxn. Sev dil LA.   Marland Kitchen Complication of anesthesia   . Dysrhythmia    AFib  . Essential hypertension   . Hip pain   . Hyperlipidemia   . Hypothyroidism   . MI (myocardial infarction) ([place]) 06/05/2015  .  PONV (postoperative nausea and vomiting)   . Vitamin D deficiency    Past Surgical History:  Procedure Laterality Date  . ABDOMINAL HYSTERECTOMY     partial- one ovary remaining  . BIOPSY  09/17/2020   Procedure: BIOPSY;  Surgeon: [name], DO;  [facility];  Service: Endoscopy;;  . CARDIAC CATHETERIZATION N/A 06/06/2015   Procedure: Left Heart Cath and Coronary Angiography;  Surgeon: Peter M Martinique, MD;  Location: [place] [facility];  Service: Cardiovascular;  Laterality: N/A;  . CARDIOVERSION N/A 09/14/2020   Procedure: CARDIOVERSION;  Surgeon: Satira Sark, MD;  [facility];  Service: Cardiovascular;  Laterality: N/A;  . CHOLECYSTECTOMY    . COLONOSCOPY    . COLONOSCOPY N/A 08/13/2017   Procedure: COLONOSCOPY;  Surgeon: Daneil Dolin, MD;  [facility];  Service: Endoscopy;  Laterality: N/A;  8:30 AM  . COLONOSCOPY WITH PROPOFOL N/A 01/18/2021   [name]: Multiple tubular adenomas removed, next colonoscopy in 3 years  . ESOPHAGOGASTRODUODENOSCOPY N/A 09/11/2016   Procedure: ESOPHAGOGASTRODUODENOSCOPY (EGD);  Surgeon: Daneil Dolin, MD;  [facility];  Service: Endoscopy;  Laterality: N/A;  7:45 am - moved to 10/11 @ 10:30  . ESOPHAGOGASTRODUODENOSCOPY (EGD) WITH PROPOFOL N/A 09/17/2020   [name]: Diffuse moderate inflammation characterized by erosions and erythema found in the entire stomach.  Biopsies positive for H. pylori.  Patient has not been treated due to drug allergies.  Marland Kitchen Heel spurs Left   . MALONEY DILATION N/A 09/11/2016   Procedure: Venia Minks DILATION;  Surgeon: Daneil Dolin, MD;  [facility];  Service: Endoscopy;  Laterality: N/A;  . POLYPECTOMY  08/13/2017   Procedure: POLYPECTOMY;  Surgeon: Daneil Dolin, MD;  [facility];  Service: Endoscopy;;  colon  . POLYPECTOMY  01/18/2021   Procedure: POLYPECTOMY;  Surgeon: Daneil Dolin, MD;  [facility];  Service: Endoscopy;;  . SHOULDER ARTHROSCOPY Right   . TUBAL LIGATION      Current Outpatient Medications  Medication Sig Dispense Refill  . acetaminophen (TYLENOL) 500 MG tablet Take 500 mg by mouth every 6 (six) hours as needed for mild pain or moderate pain.    Marland Kitchen apixaban (ELIQUIS) 5 MG TABS tablet Take 1 tablet (5 mg total) by mouth 2 (two) times daily. 60 tablet 11  . atorvastatin (LIPITOR) 80 MG tablet Take 1 tablet (80 mg total) by mouth at bedtime. 90 tablet 3  . calcium carbonate (OS-CAL - DOSED IN MG OF ELEMENTAL CALCIUM) 1250 (500 Ca) MG tablet Take 500 mg by mouth daily.    . Cholecalciferol  (VITAMIN D) 50 MCG (2000 UT) CAPS Take 1 tablet daily 30 capsule 2  . diclofenac Sodium (VOLTAREN) 1 % GEL Apply 1 application topically 4 (four) times daily as needed (pain).    Marland Kitchen dofetilide (TIKOSYN) 125 MCG capsule Take 1 capsule (125 mcg total) by mouth 2 (two) times daily. 60 capsule 6  . ferrous sulfate 325 (65 FE) MG EC tablet Take 325 mg by mouth daily.    . furosemide (LASIX) 40 MG tablet Take 40 mg twice a day 4 days/week (Tuesday, Thursday, Saturday, Sunday) and take 40 mg  daily 3 days/week (Monday, Wednesday and Friday) (Patient taking differently: Take 40 mg twice a day 4 days/week (Tuesday, Thursday, Saturday, Sunday) and take 40 mg  daily 3 days/week (Monday, Wednesday and Friday)) 120 tablet 6  . gabapentin (NEURONTIN) 300 MG capsule Take 300 mg by  mouth 2 (two) times daily.     Marland Kitchen levothyroxine (SYNTHROID) 100 MCG tablet Take 1 tablet (100 mcg total) by mouth daily before breakfast. 90 tablet 3  . loratadine (CLARITIN) 10 MG tablet Take 10 mg by mouth daily.    . magnesium oxide (MAG-OX) 400 MG tablet Take 0.5 tablets (200 mg total) by mouth daily. 30 tablet 6  . metoprolol tartrate (LOPRESSOR) 25 MG tablet Take 25 mg by mouth 2 (two) times daily.    . nitroGLYCERIN (NITROSTAT) 0.4 MG SL tablet DISSOLVE ONE TABLET UNDER THE TONGUE EVERY 5 MINUTES AS NEEDED FOR CHEST PAIN.  DO NOT EXCEED A TOTAL OF 3 DOSES IN 15 MINUTES (Patient taking differently: DISSOLVE ONE TABLET UNDER THE TONGUE EVERY 5 MINUTES AS NEEDED FOR CHEST PAIN.  DO NOT EXCEED A TOTAL OF 3 DOSES IN 15 MINUTES) 25 tablet 3  . potassium chloride SA (KLOR-CON) 20 MEQ tablet Take 2 tablets (40 mEq total) by mouth daily. 90 tablet 6  . RANEXA 500 MG 12 hr tablet Take 1 tablet (500 mg total) by mouth 2 (two) times daily. 60 tablet 6  . Vitamin D, Ergocalciferol, (DRISDOL) 1.25 MG (50000 UNIT) CAPS capsule Take 1 capsule (50,000 Units total) by mouth every 7 (seven) days. 12 capsule 0   No current facility-administered  medications for this encounter.    Allergies  Allergen Reactions  . Afrin [Oxymetazoline] Anaphylaxis  . Amoxicillin Anaphylaxis  . Lisinopril Anaphylaxis  . Ranolazine Anaphylaxis    Dizzy, Reaction occurred with generic med  . Vicodin [Hydrocodone-Acetaminophen] Diarrhea and Nausea And Vomiting  . Demerol [Meperidine] Nausea And Vomiting  . Lodine [Etodolac] Rash    Social History   Socioeconomic History  . Marital status: Single    Spouse name: Not on file  . Number of children: Not on file  . Years of education: Not on file  . Highest education level: Not on file  Occupational History  . Not on file  Tobacco Use  . Smoking status: Former Smoker    Packs/day: 1.00    Years: 20.00    Pack years: 20.00    Types: Cigarettes    Start date: 12/02/1977    Quit date: 12/02/2002    Years since quitting: 18.3  . Smokeless tobacco: Never Used  Vaping Use  . Vaping Use: Never used  Substance and Sexual Activity  . Alcohol use: No    Alcohol/week: 0.0 standard drinks  . Drug use: No  . Sexual activity: Yes    Birth control/protection: Surgical  Other Topics Concern  . Not on file  Social History Narrative   Retired from [employer].   Social Determinants of Health   Financial Resource Strain: Low Risk   . Difficulty of Paying Living Expenses: Not very hard  Food Insecurity: Not on file  Transportation Needs: Not on file  Physical Activity: Not on file  Stress: Not on file  Social Connections: Not on file  Intimate Partner Violence: Not on file     ROS- All systems are reviewed and negative except as per the HPI above.  Physical Exam: Vitals:   03/27/21 1021  BP: (!) 142/80  Pulse: 62  Weight: 101.2 kg  Height: 5' (1.524 m)    GEN- The patient is a well appearing obese female, alert and oriented x 3 today.   HEENT-head normocephalic, atraumatic, sclera clear, conjunctiva pink, hearing intact, trachea midline. Lungs- Clear to ausculation bilaterally, normal  work of breathing Heart- Regular rate and rhythm,  no murmurs, rubs or gallops  GI- soft, NT, ND, + BS Extremities- no clubbing, cyanosis. 1+ bilateral edema MS- no significant deformity or atrophy Skin- no rash or lesion Psych- euthymic mood, full affect Neuro- strength and sensation are intact   Wt Readings from Last 3 Encounters:  03/27/21 101.2 kg  03/20/21 99.6 kg  03/09/21 98.5 kg    EKG today demonstrates  SR Vent. rate 62 BPM PR interval 192 ms QRS duration 96 ms QT/QTcB 460/466 ms  Echo 08/29/20 demonstrated  1. Left ventricular ejection fraction, by estimation, is 40 to 45%. The  left ventricle has mildly decreased function. The left ventricle  demonstrates global hypokinesis. There is severe left ventricular  hypertrophy. Left ventricular diastolic parameters  are indeterminate.  2. Right ventricular systolic function is normal. The right ventricular  size is normal. There is moderately elevated pulmonary artery systolic  pressure.  3. Left atrial size was severely dilated.  4. Right atrial size was severely dilated.  5. The mitral valve is normal in structure. Mild to moderate mitral valve  regurgitation. No evidence of mitral stenosis.  6. Tricuspid valve regurgitation is moderate.  7. The aortic valve is tricuspid. Aortic valve regurgitation is mild to  moderate.  8. Moderate pulmonary HTN, PASP is 50 mmHg.  9. The inferior vena cava is dilated in size with <50% respiratory  variability, suggesting right atrial pressure of 15 mmHg.   Epic records are reviewed at length today  CHA2DS2-VASc Score = 5  The patient's score is based upon: CHF History: Yes HTN History: Yes Diabetes History: No Stroke History: No Vascular Disease History: Yes Age Score: 1 Gender Score: 1      ASSESSMENT AND PLAN: 1. Persistent Atrial Fibrillation (ICD10:  I48.19) The patient's CHA2DS2-VASc score is 5, indicating a 7.2% annual risk of stroke.   S/p dofetilide  loading 11/9-11/14/21 Patient appears to be maintaining SR. Continue dofetilide 125 mcg BID. QT stable. Continue Eliquis 5 mg BID Continue Lopressor 25 mg BID  2. Secondary Hypercoagulable State (ICD10:  D68.69) The patient is at significant risk for stroke/thromboembolism based upon her CHA2DS2-VASc Score of 5.  Continue Apixaban (Eliquis).   3. Obesity Body mass index is 43.59 kg/m. Lifestyle modification was discussed and encouraged including regular physical activity and weight reduction.  4. OSA Patient reports compliance with CPAP therapy.  5. Chronic combined systolic and diastolic CHF Her weight is up with some increased lower extremity edema. The extra lasix prescribed at the last visit did help her symptoms. She denies orthopnea or PND. Will increase Lasix to 40 mg BID. Also increase K+ supplement. Will have her follow up with primary cardiologist for evaluation.   6. CAD No anginal symptoms.  Should not have up titration of Ranexa 2/2 QT.  7. HTN Stable, no changes today.   Follow up in the AF clinic in 3 months.    [facility] [address] [place], [place] [address] [phone]

## 2021-03-27 ENCOUNTER — Encounter (HOSPITAL_COMMUNITY): Payer: Self-pay | Admitting: Physician Assistant

## 2021-03-27 ENCOUNTER — Other Ambulatory Visit: Payer: Self-pay

## 2021-03-27 ENCOUNTER — Ambulatory Visit (HOSPITAL_COMMUNITY)
Admission: RE | Admit: 2021-03-27 | Discharge: 2021-03-27 | Disposition: A | Discharge status: Home / Self Care | Payer: PPO | Source: Ambulatory Visit | Attending: Physician Assistant | Admitting: Physician Assistant

## 2021-03-27 VITALS — BP 142/80 | HR 62 | Ht 60.0 in | Wt 223.2 lb

## 2021-03-27 DIAGNOSIS — Z6841 Body Mass Index (BMI) 40.0 and over, adult: Secondary | ICD-10-CM | POA: Insufficient documentation

## 2021-03-27 DIAGNOSIS — E039 Hypothyroidism, unspecified: Secondary | ICD-10-CM | POA: Diagnosis not present

## 2021-03-27 DIAGNOSIS — I4819 Other persistent atrial fibrillation: Secondary | ICD-10-CM | POA: Insufficient documentation

## 2021-03-27 DIAGNOSIS — Z79890 Hormone replacement therapy (postmenopausal): Secondary | ICD-10-CM | POA: Diagnosis not present

## 2021-03-27 DIAGNOSIS — D6869 Other thrombophilia: Secondary | ICD-10-CM | POA: Diagnosis not present

## 2021-03-27 DIAGNOSIS — G4733 Obstructive sleep apnea (adult) (pediatric): Secondary | ICD-10-CM | POA: Diagnosis not present

## 2021-03-27 DIAGNOSIS — I110 Hypertensive heart disease with heart failure: Secondary | ICD-10-CM | POA: Insufficient documentation

## 2021-03-27 DIAGNOSIS — Z7901 Long term (current) use of anticoagulants: Secondary | ICD-10-CM | POA: Insufficient documentation

## 2021-03-27 DIAGNOSIS — E669 Obesity, unspecified: Secondary | ICD-10-CM | POA: Insufficient documentation

## 2021-03-27 DIAGNOSIS — Z87891 Personal history of nicotine dependence: Secondary | ICD-10-CM | POA: Insufficient documentation

## 2021-03-27 DIAGNOSIS — Z885 Allergy status to narcotic agent status: Secondary | ICD-10-CM | POA: Diagnosis not present

## 2021-03-27 DIAGNOSIS — Z8249 Family history of ischemic heart disease and other diseases of the circulatory system: Secondary | ICD-10-CM | POA: Diagnosis not present

## 2021-03-27 DIAGNOSIS — I5042 Chronic combined systolic (congestive) and diastolic (congestive) heart failure: Secondary | ICD-10-CM | POA: Diagnosis not present

## 2021-03-27 DIAGNOSIS — I2510 Atherosclerotic heart disease of native coronary artery without angina pectoris: Secondary | ICD-10-CM | POA: Insufficient documentation

## 2021-03-27 DIAGNOSIS — Z880 Allergy status to penicillin: Secondary | ICD-10-CM | POA: Insufficient documentation

## 2021-03-27 DIAGNOSIS — Z79899 Other long term (current) drug therapy: Secondary | ICD-10-CM | POA: Diagnosis not present

## 2021-03-27 DIAGNOSIS — Z888 Allergy status to other drugs, medicaments and biological substances status: Secondary | ICD-10-CM | POA: Diagnosis not present

## 2021-03-27 LAB — BASIC METABOLIC PANEL
Anion gap: 6 (ref 5–15)
BUN: 11 mg/dL (ref 8–23)
CO2: 26 mmol/L (ref 22–32)
Calcium: 8.5 mg/dL — ABNORMAL LOW (ref 8.9–10.3)
Chloride: 106 mmol/L (ref 98–111)
Creatinine, Ser: 0.98 mg/dL (ref 0.44–1.00)
GFR, Estimated: >60 mL/min (ref >60)
Glucose, Bld: 73 mg/dL (ref 70–99)
Potassium: 4 mmol/L (ref 3.5–5.1)
Sodium: 138 mmol/L (ref 135–145)

## 2021-03-27 LAB — MAGNESIUM: Magnesium: 2.2 mg/dL (ref 1.7–2.4)

## 2021-03-27 MED ORDER — FUROSEMIDE 40 MG PO TABS: 40.0000 mg | ORAL_TABLET | Freq: Two times a day (BID) | ORAL | 1 refills | Status: DC

## 2021-03-27 MED ORDER — POTASSIUM CHLORIDE CRYS ER 20 MEQ PO TBCR: 40.0000 meq | EXTENDED_RELEASE_TABLET | Freq: Every day | ORAL | 1 refills | Status: DC

## 2021-03-27 NOTE — Patient Instructions (Signed)
Take lasix 40mg  twice a day  Follow up with DR. McDowell in 2-3 weeks

## 2021-03-27 NOTE — Progress Notes (Signed)
Thanks.  I am glad to see she is back in sinus rhythm.

## 2021-03-28 ENCOUNTER — Telehealth: Payer: Self-pay | Admitting: "Endocrinology

## 2021-03-28 NOTE — Telephone Encounter (Signed)
Received medical records request from [place], sent to [facility] and [facility].

## 2021-03-31 DIAGNOSIS — G4733 Obstructive sleep apnea (adult) (pediatric): Secondary | ICD-10-CM | POA: Diagnosis not present

## 2021-04-09 ENCOUNTER — Encounter: Payer: Self-pay | Admitting: Cardiology

## 2021-04-09 NOTE — Progress Notes (Signed)
Cardiology Office Note  Date: 04/10/2021   ID: Maria Fry, DOB 1951-10-10, MRN [id number]  PCP:  Noreene Larsson, NP  Cardiologist:  Rozann Lesches, MD Electrophysiologist:  Thompson Grayer, MD   Chief Complaint  Patient presents with  . Cardiac follow-up    History of Present Illness: Maria Fry is a 70 y.o. female former patient of Dr. Bronson Ing now presenting to establish follow-up with me.  I reviewed extensive records and updated the chart.  She was most recently seen in the atrial fibrillation clinic by Mr. Fenton PA-C in late April.  She states that she has had trouble with leg swelling, dependent in nature.  In the mornings when she gets up the swelling is gone, tends to be worse towards the end of the day when her feet of been down.  She has compression stockings, but does not use them regularly.  Lasix with potassium supplement was increased in late April.  This has been somewhat helpful.  She remains on Tikosyn for control of paroxysmal to persistent atrial fibrillation, Eliquis for stroke prophylaxis.  Her most recent echocardiogram in March revealed LVEF 50 to 55% range, normal RV contraction.  Left atrium is severely dilated.  Mild to moderate mitral regurgitation noted with evidence of moderate pulmonary hypertension.  Past Medical History:  Diagnosis Date  . Arthritis    Right knee  . Atrial fibrillation ([place])   . CAD (coronary artery disease)    a. 06/07/15 NSTEMI/Cath: Dist LAD 100%, mid LAD 10%. Otw nl cors. Nl EF w/ inferoapical AK.  . Essential hypertension   . Hip pain   . History of cardiomyopathy 04/23/2016   a. 04/2016 Echo: EF 45%, Gr2 DD; b. 11/2019 Echo: EF 45-50%, mod LVH. Gr2 DD. Nl RV fxn. Sev dil LA.   Marland Kitchen Hyperlipidemia   . Hypothyroidism   . NSTEMI (non-ST elevated myocardial infarction) ([place]) 06/05/2015  . Vitamin D deficiency     Past Surgical History:  Procedure Laterality Date  . ABDOMINAL HYSTERECTOMY     partial- one ovary remaining   . BIOPSY  09/17/2020   Procedure: BIOPSY;  Surgeon: [name], DO;  [facility];  Service: Endoscopy;;  . CARDIAC CATHETERIZATION N/A 06/06/2015   Procedure: Left Heart Cath and Coronary Angiography;  Surgeon: Peter M Martinique, MD;  Location: [place] [facility];  Service: Cardiovascular;  Laterality: N/A;  . CARDIOVERSION N/A 09/14/2020   Procedure: CARDIOVERSION;  Surgeon: Satira Sark, MD;  [facility];  Service: Cardiovascular;  Laterality: N/A;  . CHOLECYSTECTOMY    . COLONOSCOPY    . COLONOSCOPY N/A 08/13/2017   Procedure: COLONOSCOPY;  Surgeon: Daneil Dolin, MD;  [facility];  Service: Endoscopy;  Laterality: N/A;  8:30 AM  . COLONOSCOPY WITH PROPOFOL N/A 01/18/2021   [name]: Multiple tubular adenomas removed, next colonoscopy in 3 years  . ESOPHAGOGASTRODUODENOSCOPY N/A 09/11/2016   Procedure: ESOPHAGOGASTRODUODENOSCOPY (EGD);  Surgeon: Daneil Dolin, MD;  [facility];  Service: Endoscopy;  Laterality: N/A;  7:45 am - moved to 10/11 @ 10:30  . ESOPHAGOGASTRODUODENOSCOPY (EGD) WITH PROPOFOL N/A 09/17/2020   [name]: Diffuse moderate inflammation characterized by erosions and erythema found in the entire stomach.  Biopsies positive for H. pylori.  Patient has not been treated due to drug allergies.  Marland Kitchen Heel spurs Left   . MALONEY DILATION N/A 09/11/2016   Procedure: Venia Minks DILATION;  Surgeon: Daneil Dolin, MD;  [facility];  Service: Endoscopy;  Laterality: N/A;  . POLYPECTOMY  08/13/2017   Procedure: POLYPECTOMY;  Surgeon: Daneil Dolin, MD;  [facility];  Service: Endoscopy;;  colon  . POLYPECTOMY  01/18/2021   Procedure: POLYPECTOMY;  Surgeon: Daneil Dolin, MD;  [facility];  Service: Endoscopy;;  . SHOULDER ARTHROSCOPY Right   . TUBAL LIGATION      Current Outpatient Medications  Medication Sig Dispense Refill  . acetaminophen (TYLENOL) 500 MG tablet Take 500 mg by mouth every 6  (six) hours as needed for mild pain or moderate pain.    Marland Kitchen apixaban (ELIQUIS) 5 MG TABS tablet Take 1 tablet (5 mg total) by mouth 2 (two) times daily. 60 tablet 11  . atorvastatin (LIPITOR) 80 MG tablet Take 1 tablet (80 mg total) by mouth at bedtime. 90 tablet 3  . calcium carbonate (OS-CAL - DOSED IN MG OF ELEMENTAL CALCIUM) 1250 (500 Ca) MG tablet Take 500 mg by mouth daily.    . Cholecalciferol (VITAMIN D) 50 MCG (2000 UT) CAPS Take 1 tablet daily 30 capsule 2  . diclofenac Sodium (VOLTAREN) 1 % GEL Apply 1 application topically 4 (four) times daily as needed (pain).    Marland Kitchen dofetilide (TIKOSYN) 125 MCG capsule Take 1 capsule (125 mcg total) by mouth 2 (two) times daily. 60 capsule 6  . ferrous sulfate 325 (65 FE) MG EC tablet Take 325 mg by mouth daily.    . furosemide (LASIX) 40 MG tablet Take 1 tablet (40 mg total) by mouth 2 (two) times daily. 180 tablet 1  . gabapentin (NEURONTIN) 300 MG capsule Take 300 mg by mouth 2 (two) times daily.     Marland Kitchen levothyroxine (SYNTHROID) 100 MCG tablet Take 1 tablet (100 mcg total) by mouth daily before breakfast. 90 tablet 3  . loratadine (CLARITIN) 10 MG tablet Take 10 mg by mouth daily.    . magnesium oxide (MAG-OX) 400 MG tablet Take 0.5 tablets (200 mg total) by mouth daily. 30 tablet 6  . metoprolol tartrate (LOPRESSOR) 25 MG tablet Take 25 mg by mouth 2 (two) times daily.    . nitroGLYCERIN (NITROSTAT) 0.4 MG SL tablet DISSOLVE ONE TABLET UNDER THE TONGUE EVERY 5 MINUTES AS NEEDED FOR CHEST PAIN.  DO NOT EXCEED A TOTAL OF 3 DOSES IN 15 MINUTES (Patient taking differently: DISSOLVE ONE TABLET UNDER THE TONGUE EVERY 5 MINUTES AS NEEDED FOR CHEST PAIN.  DO NOT EXCEED A TOTAL OF 3 DOSES IN 15 MINUTES) 25 tablet 3  . potassium chloride SA (KLOR-CON) 20 MEQ tablet Take 2 tablets (40 mEq total) by mouth daily. 180 tablet 1  . RANEXA 500 MG 12 hr tablet Take 1 tablet (500 mg total) by mouth 2 (two) times daily. 60 tablet 6  . Vitamin D, Ergocalciferol, (DRISDOL)  1.25 MG (50000 UNIT) CAPS capsule Take 1 capsule (50,000 Units total) by mouth every 7 (seven) days. 12 capsule 0   No current facility-administered medications for this visit.   Allergies:  Afrin [oxymetazoline], Amoxicillin, Lisinopril, Ranolazine, Vicodin [hydrocodone-acetaminophen], Demerol [meperidine], and Lodine [etodolac]   ROS: No orthopnea or PND.  Physical Exam: VS:  BP 136/84   Pulse 72   Ht 5' (1.524 m)   Wt 222 lb (100.7 kg)   SpO2 96%   BMI 43.36 kg/m , BMI Body mass index is 43.36 kg/m.  Wt Readings from Last 3 Encounters:  04/10/21 222 lb (100.7 kg)  03/27/21 223 lb 3.2 oz (101.2 kg)  03/20/21 219 lb 9.6 oz (99.6 kg)    General: Patient appears comfortable at rest. HEENT: Conjunctiva and lids normal, wearing a mask. Neck: Supple, no elevated JVP or carotid bruits, no thyromegaly. Lungs: Clear to auscultation, nonlabored breathing at rest. Cardiac: Regular rate and rhythm, no S3, 1/6 systolic murmur, no pericardial rub. Extremities: 1+ ankle and lower leg edema, distal pulses 2+.  ECG:  An ECG dated 03/27/2021 was personally reviewed today and demonstrated:  Sinus rhythm with nonspecific ST-T changes.  Recent Labwork: 09/15/2020: B Natriuretic Peptide 1,278.0 01/10/2021: TSH 4.010 03/02/2021: ALT 10; AST 16; Hemoglobin 10.9; Platelets 234 03/27/2021: BUN 11; Creatinine, Ser 0.98; Magnesium 2.2; Potassium 4.0; Sodium 138     Component Value Date/Time   CHOL 128 03/02/2021 0926   TRIG 50 03/02/2021 0926   HDL 46 03/02/2021 0926   CHOLHDL 2.7 01/03/2020 0948   VLDL 12 06/23/2017 0902   LDLCALC 71 03/02/2021 0926   LDLCALC 63 01/03/2020 0948    Other Studies Reviewed Today:  Echocardiogram 02/08/2021: 1. Left ventricular ejection fraction, by estimation, is 50 to 55%. The  left ventricle has low normal function. The left ventricle has no regional  wall motion abnormalities. The left ventricular internal cavity size was  mildly dilated. There is mild  left  ventricular hypertrophy. Left ventricular diastolic parameters are  indeterminate.  2. Right ventricular systolic function is normal. The right ventricular  size is normal.  3. Left atrial size was severely dilated.  4. Right atrial size was moderately dilated.  5. The mitral valve is abnormal. Mild to moderate mitral valve  regurgitation. No evidence of mitral stenosis.  6. The aortic valve is tricuspid. There is mild calcification of the  aortic valve. There is mild thickening of the aortic valve. Aortic valve  regurgitation is mild. No aortic stenosis is present.  7. Moderate pulmonary hypertension, PASP is 52 mmHg.  8. The inferior vena cava is normal in size with greater than 50%  respiratory variability, suggesting right atrial pressure of 3 mmHg.   Assessment and Plan:  1.  Persistent atrial fibrillation with CHA2DS2-VASc score of 5.  She is on Eliquis for stroke prophylaxis, has been maintaining sinus rhythm on combination of Lopressor and Tikosyn.  No palpitations.  I reviewed her lab work from April.  She does not report any spontaneous bleeding problems.  2.  Chronic diastolic heart failure.  LVEF 50 to 55% and RV contraction normal by most recent echocardiogram.  She also has mild to moderate mitral regurgitation and moderately elevated PASP.  Lasix and potassium supplements recently doubled in late April.  She reports fluctuating, dependent leg edema.  I have recommended regular use of compression stockings, also discussed sodium restriction.  Check BMET to reassess renal function and potassium.  3.  CAD with history of occluded distal LAD being managed medically.  No active angina at this time.  Continue Lipitor, Lopressor, Ranexa, and as needed nitroglycerin.  Medication Adjustments/Labs and Tests Ordered: Current medicines are reviewed at length with the patient today.  Concerns regarding medicines are outlined above.   Tests Ordered: Orders Placed This Encounter   Procedures  . Basic metabolic panel    Medication Changes: No orders of the defined types were placed in this encounter.   Disposition:  Follow up 3 months.  Signed, Satira Sark, MD, [facility] 04/10/2021 10:52 AM    [facility]. [address], [place], [place] [address] Phone: [phone]; Fax: [phone])  [phone] 

## 2021-04-10 ENCOUNTER — Ambulatory Visit: Payer: PPO | Admitting: Cardiology

## 2021-04-10 ENCOUNTER — Encounter: Payer: Self-pay | Admitting: Cardiology

## 2021-04-10 ENCOUNTER — Other Ambulatory Visit (HOSPITAL_COMMUNITY)
Admission: RE | Admit: 2021-04-10 | Discharge: 2021-04-10 | Disposition: A | Discharge status: Home / Self Care | Payer: PPO | Source: Ambulatory Visit | Attending: Cardiology | Admitting: Cardiology

## 2021-04-10 ENCOUNTER — Other Ambulatory Visit: Payer: Self-pay

## 2021-04-10 VITALS — BP 136/84 | HR 72 | Ht 60.0 in | Wt 222.0 lb

## 2021-04-10 DIAGNOSIS — I25119 Atherosclerotic heart disease of native coronary artery with unspecified angina pectoris: Secondary | ICD-10-CM | POA: Diagnosis not present

## 2021-04-10 DIAGNOSIS — Z79899 Other long term (current) drug therapy: Secondary | ICD-10-CM | POA: Insufficient documentation

## 2021-04-10 DIAGNOSIS — I4819 Other persistent atrial fibrillation: Secondary | ICD-10-CM | POA: Insufficient documentation

## 2021-04-10 DIAGNOSIS — I5032 Chronic diastolic (congestive) heart failure: Secondary | ICD-10-CM | POA: Diagnosis not present

## 2021-04-10 LAB — BASIC METABOLIC PANEL
Anion gap: 6 (ref 5–15)
BUN: 16 mg/dL (ref 8–23)
CO2: 28 mmol/L (ref 22–32)
Calcium: 8.7 mg/dL — ABNORMAL LOW (ref 8.9–10.3)
Chloride: 105 mmol/L (ref 98–111)
Creatinine, Ser: 0.95 mg/dL (ref 0.44–1.00)
GFR, Estimated: >60 mL/min (ref >60)
Glucose, Bld: 95 mg/dL (ref 70–99)
Potassium: 4 mmol/L (ref 3.5–5.1)
Sodium: 139 mmol/L (ref 135–145)

## 2021-04-10 NOTE — Patient Instructions (Signed)
Medication Instructions:  Your physician recommends that you continue on your current medications as directed. Please refer to the Current Medication list given to you today.  *If you need a refill on your cardiac medications before your next appointment, please call your pharmacy*   Lab Work:  BMET Today at [facility]  If you have labs (blood work) drawn today and your tests are completely normal, you will receive your results only by: Marland Kitchen MyChart Message (if you have MyChart) OR . A paper copy in the mail If you have any lab test that is abnormal or we need to change your treatment, we will call you to review the results.   Testing/Procedures: None today   Follow-Up: At [facility], you and your health needs are our priority.  As part of our continuing mission to provide you with exceptional heart care, we have created designated Provider Care Teams.  These Care Teams include your primary Cardiologist (physician) and Advanced Practice Providers (APPs -  Physician Assistants and Nurse Practitioners) who all work together to provide you with the care you need, when you need it.  We recommend signing up for the patient portal called "MyChart".  Sign up information is provided on this After Visit Summary.  MyChart is used to connect with patients for Virtual Visits (Telemedicine).  Patients are able to view lab/test results, encounter notes, upcoming appointments, etc.  Non-urgent messages can be sent to your provider as well.   To learn more about what you can do with MyChart, go to [email or web address].    Your next appointment:   3 month(s)  The format for your next appointment:   In Person  Provider:   Rozann Lesches, MD   Other Instructions Please wear your compression stockings every day. Remove at bedtime.

## 2021-04-12 ENCOUNTER — Telehealth: Payer: Self-pay

## 2021-04-12 ENCOUNTER — Other Ambulatory Visit: Payer: Self-pay | Admitting: Nurse Practitioner

## 2021-04-12 DIAGNOSIS — E785 Hyperlipidemia, unspecified: Secondary | ICD-10-CM

## 2021-04-12 MED ORDER — ATORVASTATIN CALCIUM 80 MG PO TABS: 80.0000 mg | ORAL_TABLET | Freq: Every day | ORAL | 3 refills | Status: DC

## 2021-04-12 NOTE — Telephone Encounter (Signed)
Pt called wanting a refill of her cholesterol med sent in to [facility]. It does not look like you have ever filled this for her. Please advise.

## 2021-04-12 NOTE — Telephone Encounter (Signed)
I sent in refill

## 2021-04-12 NOTE — Telephone Encounter (Signed)
Pt informed

## 2021-05-01 DIAGNOSIS — G4733 Obstructive sleep apnea (adult) (pediatric): Secondary | ICD-10-CM | POA: Diagnosis not present

## 2021-05-03 ENCOUNTER — Telehealth: Payer: Self-pay

## 2021-05-08 ENCOUNTER — Other Ambulatory Visit: Payer: Self-pay | Admitting: *Deleted

## 2021-05-08 MED ORDER — DOFETILIDE 125 MCG PO CAPS: 125.0000 ug | ORAL_CAPSULE | Freq: Two times a day (BID) | ORAL | 6 refills | Status: DC

## 2021-05-24 ENCOUNTER — Ambulatory Visit
Admission: EM | Admit: 2021-05-24 | Discharge: 2021-05-24 | Disposition: A | Discharge status: Home / Self Care | Payer: Medicare Other | Attending: Emergency Medicine | Admitting: Emergency Medicine

## 2021-05-24 ENCOUNTER — Encounter: Payer: Self-pay | Admitting: Emergency Medicine

## 2021-05-24 ENCOUNTER — Other Ambulatory Visit: Payer: Self-pay

## 2021-05-24 DIAGNOSIS — M549 Dorsalgia, unspecified: Secondary | ICD-10-CM | POA: Diagnosis not present

## 2021-05-24 MED ORDER — TIZANIDINE HCL 2 MG PO TABS: 2.0000 mg | ORAL_TABLET | Freq: Four times a day (QID) | ORAL | 0 refills | Status: DC | PRN

## 2021-05-24 MED ORDER — PREDNISONE 20 MG PO TABS: 20.0000 mg | ORAL_TABLET | Freq: Two times a day (BID) | ORAL | 0 refills | Status: AC
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
## 2021-05-24 NOTE — Discharge Instructions (Addendum)
Continue conservative management of rest, ice, and gentle stretches Prednisone prescribed.  Take as directed and to completion Take tizanidine at nighttime for symptomatic relief. Avoid driving or operating heavy machinery while using medication. Follow up with PCP if symptoms persist Return or go to the ER if you have any new or worsening symptoms (fever, chills, chest pain, abdominal pain, changes in bowel or bladder habits, pain radiating into lower legs, etc...)

## 2021-05-31 DIAGNOSIS — G4733 Obstructive sleep apnea (adult) (pediatric): Secondary | ICD-10-CM | POA: Diagnosis not present

## 2021-06-18 ENCOUNTER — Telehealth: Payer: Self-pay | Admitting: Cardiology

## 2021-06-18 NOTE — Telephone Encounter (Signed)
New message   Prior auth needed.   Patient called she switched insurance companies and they will not cover her medication RANEXA 500 MG 12 hr tablet , [employer] will not cover this medication without having records sent to them stating that she needs to have it.   She has been out of it for a week.

## 2021-06-18 NOTE — Telephone Encounter (Signed)
Prior Authorization created on [email or web address]. Spoke to pt and informed her of PA request. She verbalized understanding.

## 2021-06-19 ENCOUNTER — Telehealth: Payer: Self-pay

## 2021-06-19 ENCOUNTER — Telehealth: Payer: Self-pay | Admitting: Cardiology

## 2021-06-19 MED ORDER — RANOLAZINE ER 500 MG PO TB12: 500.0000 mg | ORAL_TABLET | Freq: Two times a day (BID) | ORAL | 6 refills | Status: DC

## 2021-06-19 NOTE — Telephone Encounter (Signed)
Noted  

## 2021-06-19 NOTE — Telephone Encounter (Signed)
New message    Patient states she can not take the generic of Renexa due to allergy she has to have name brand , she said that Dr Domenic Polite should have it on her chart that she has a allergic reaction to the generic.

## 2021-06-19 NOTE — Telephone Encounter (Signed)
Pt needs PA for Ranexa (denied/appealed). Ranolazine is covered by [employer], but pt is allergic. Will wait on appeal request.

## 2021-06-19 NOTE — Telephone Encounter (Signed)
PA denied d/t insurance will pay for generic Renexa. Okay to switch per A. Leonides Sake, NP.

## 2021-06-19 NOTE — Telephone Encounter (Signed)
error 

## 2021-06-20 ENCOUNTER — Telehealth: Payer: Self-pay

## 2021-06-20 NOTE — Telephone Encounter (Signed)
Reference #: [id number]  Brand Ranexa Tab 500mg , use as directed, is approved for a non-formulary exception through 12/01/2021 under your Medicare Part D benefit

## 2021-06-29 ENCOUNTER — Other Ambulatory Visit: Payer: Self-pay

## 2021-06-29 ENCOUNTER — Ambulatory Visit (HOSPITAL_COMMUNITY)
Admission: RE | Admit: 2021-06-29 | Discharge: 2021-06-29 | Disposition: A | Discharge status: Home / Self Care | Payer: Medicare Other | Source: Ambulatory Visit | Attending: Physician Assistant | Admitting: Physician Assistant

## 2021-06-29 ENCOUNTER — Other Ambulatory Visit (HOSPITAL_COMMUNITY): Payer: Self-pay

## 2021-06-29 ENCOUNTER — Encounter (HOSPITAL_COMMUNITY): Payer: Self-pay | Admitting: Physician Assistant

## 2021-06-29 VITALS — BP 126/70 | HR 59 | Ht 60.0 in | Wt 221.0 lb

## 2021-06-29 DIAGNOSIS — Z8249 Family history of ischemic heart disease and other diseases of the circulatory system: Secondary | ICD-10-CM | POA: Diagnosis not present

## 2021-06-29 DIAGNOSIS — Z880 Allergy status to penicillin: Secondary | ICD-10-CM | POA: Diagnosis not present

## 2021-06-29 DIAGNOSIS — E669 Obesity, unspecified: Secondary | ICD-10-CM | POA: Diagnosis not present

## 2021-06-29 DIAGNOSIS — Z87891 Personal history of nicotine dependence: Secondary | ICD-10-CM | POA: Insufficient documentation

## 2021-06-29 DIAGNOSIS — Z6841 Body Mass Index (BMI) 40.0 and over, adult: Secondary | ICD-10-CM | POA: Diagnosis not present

## 2021-06-29 DIAGNOSIS — Z885 Allergy status to narcotic agent status: Secondary | ICD-10-CM | POA: Diagnosis not present

## 2021-06-29 DIAGNOSIS — I4819 Other persistent atrial fibrillation: Secondary | ICD-10-CM | POA: Diagnosis not present

## 2021-06-29 DIAGNOSIS — Z888 Allergy status to other drugs, medicaments and biological substances status: Secondary | ICD-10-CM | POA: Insufficient documentation

## 2021-06-29 DIAGNOSIS — E039 Hypothyroidism, unspecified: Secondary | ICD-10-CM | POA: Diagnosis not present

## 2021-06-29 DIAGNOSIS — I5042 Chronic combined systolic (congestive) and diastolic (congestive) heart failure: Secondary | ICD-10-CM | POA: Insufficient documentation

## 2021-06-29 DIAGNOSIS — Z79899 Other long term (current) drug therapy: Secondary | ICD-10-CM | POA: Diagnosis not present

## 2021-06-29 DIAGNOSIS — D6869 Other thrombophilia: Secondary | ICD-10-CM | POA: Insufficient documentation

## 2021-06-29 DIAGNOSIS — Z7901 Long term (current) use of anticoagulants: Secondary | ICD-10-CM | POA: Insufficient documentation

## 2021-06-29 DIAGNOSIS — G4733 Obstructive sleep apnea (adult) (pediatric): Secondary | ICD-10-CM | POA: Diagnosis not present

## 2021-06-29 DIAGNOSIS — E785 Hyperlipidemia, unspecified: Secondary | ICD-10-CM | POA: Insufficient documentation

## 2021-06-29 DIAGNOSIS — Z79890 Hormone replacement therapy (postmenopausal): Secondary | ICD-10-CM | POA: Diagnosis not present

## 2021-06-29 DIAGNOSIS — I110 Hypertensive heart disease with heart failure: Secondary | ICD-10-CM | POA: Diagnosis not present

## 2021-06-29 LAB — BASIC METABOLIC PANEL
Anion gap: 5 (ref 5–15)
BUN: 15 mg/dL (ref 8–23)
CO2: 26 mmol/L (ref 22–32)
Calcium: 8.7 mg/dL — ABNORMAL LOW (ref 8.9–10.3)
Chloride: 106 mmol/L (ref 98–111)
Creatinine, Ser: 1.02 mg/dL — ABNORMAL HIGH (ref 0.44–1.00)
GFR, Estimated: 60 mL/min — ABNORMAL LOW (ref >60)
Glucose, Bld: 93 mg/dL (ref 70–99)
Potassium: 3.8 mmol/L (ref 3.5–5.1)
Sodium: 137 mmol/L (ref 135–145)

## 2021-06-29 LAB — MAGNESIUM: Magnesium: 2.1 mg/dL (ref 1.7–2.4)

## 2021-06-29 MED ORDER — POTASSIUM CHLORIDE CRYS ER 20 MEQ PO TBCR: 60.0000 meq | EXTENDED_RELEASE_TABLET | Freq: Every day | ORAL | 1 refills | Status: DC

## 2021-06-29 NOTE — Progress Notes (Signed)
Primary Care Physician: Noreene Larsson, NP Primary Cardiologist: Dr Domenic Polite  Primary Electrophysiologist: Dr Rayann Heman Referring Physician: Levell July   Maria Fry is a 70 y.o. female with a history of CAD, chronic combined systolic and diastolic CHF, HLD, HTN, OSA, hypothyroidism, and persistent atrial fibrillation who presents for follow up in the [facility]. The patient was initially diagnosed with atrial fibrillation 08/18/20 at a routine follow up with Rosaria Ferries. She was having symptoms of increased fatigue. She was started on Eliquis for a CHADS2VASC score of 5. She underwent DCCV on 09/14/20. She was hospitalized 10/15-10/17/21 for acute CHF and black stools. Workup revealed gastritis and her Eliquis was temporarily held. She resumed 09/17/20. Her ASA was discontinued and she has not had recurrence of bleeding. She was back in afib on follow up 10/02/20. She reports that she did feel better in SR with more energy and less SOB.  Pt is s/p dofetilide loading 11/9-11/14/21. She converted to sinus rhythm after the first dose however, she was noted to have significant widening of QTC out to 551 ms. Dofetilide was held for 24 hours and subsequently resumed at 125 mcg twice daily after QT normalized. She has been diagnosed with OSA and has started using her CPAP machine.   On follow up today, patient reports that she has done well since her last visit. She does notice intermittent lower extremity edema, especially related to salt intake. She denies any heart racing or palpitations. She denies any bleeding issues on anticoagulation.   Today, she denies symptoms of palpitations, SOB, chest pain, orthopnea, PND, dizziness, presyncope, syncope, bleeding, or neurologic sequela. The patient is tolerating medications without difficulties and is otherwise without complaint today.    Atrial Fibrillation Risk Factors:  she does have symptoms or diagnosis of sleep  apnea. Patient reports compliance with CPAP. she does not have a history of rheumatic fever.   she has a BMI of Body mass index is 43.16 kg/m.Marland Kitchen Filed Weights   06/29/21 1000  Weight: 100.2 kg     Family History  Problem Relation Age of Onset   Heart failure Father        Deceased   Heart attack Father        Deceased   Stroke Sister        Deceased   Heart failure Brother        Deceased   Cancer Brother        Deceased   Heart attack Brother        Deceased   Colon cancer Neg Hx      Atrial Fibrillation Management history:  Previous antiarrhythmic drugs: dofetilide  Previous cardioversions: 09/14/20 Previous ablations: none CHADS2VASC score: 5 Anticoagulation history: Eliquis   Past Medical History:  Diagnosis Date   Arthritis    Right knee   Atrial fibrillation ([place])    CAD (coronary artery disease)    a. 06/07/15 NSTEMI/Cath: Dist LAD 100%, mid LAD 10%. Otw nl cors. Nl EF w/ inferoapical AK.   Essential hypertension    Hip pain    History of cardiomyopathy 04/23/2016   a. 04/2016 Echo: EF 45%, Gr2 DD; b. 11/2019 Echo: EF 45-50%, mod LVH. Gr2 DD. Nl RV fxn. Sev dil LA.    Hyperlipidemia    Hypothyroidism    NSTEMI (non-ST elevated myocardial infarction) ([place]) 06/05/2015   Vitamin D deficiency    Past Surgical History:  Procedure Laterality Date   ABDOMINAL HYSTERECTOMY  partial- one ovary remaining   BIOPSY  09/17/2020   Procedure: BIOPSY;  Surgeon: [name], DO;  [facility];  Service: Endoscopy;;   CARDIAC CATHETERIZATION N/A 06/06/2015   Procedure: Left Heart Cath and Coronary Angiography;  Surgeon: Peter M Martinique, MD;  Location: [facility];  Service: Cardiovascular;  Laterality: N/A;   CARDIOVERSION N/A 09/14/2020   Procedure: CARDIOVERSION;  Surgeon: Satira Sark, MD;  [facility];  Service: Cardiovascular;  Laterality: N/A;   CHOLECYSTECTOMY     COLONOSCOPY     COLONOSCOPY N/A 08/13/2017    Procedure: COLONOSCOPY;  Surgeon: Daneil Dolin, MD;  [facility];  Service: Endoscopy;  Laterality: N/A;  8:30 AM   COLONOSCOPY WITH PROPOFOL N/A 01/18/2021   [name]: Multiple tubular adenomas removed, next colonoscopy in 3 years   ESOPHAGOGASTRODUODENOSCOPY N/A 09/11/2016   Procedure: ESOPHAGOGASTRODUODENOSCOPY (EGD);  Surgeon: Daneil Dolin, MD;  [facility];  Service: Endoscopy;  Laterality: N/A;  7:45 am - moved to 10/11 @ 10:30   ESOPHAGOGASTRODUODENOSCOPY (EGD) WITH PROPOFOL N/A 09/17/2020   [name]: Diffuse moderate inflammation characterized by erosions and erythema found in the entire stomach.  Biopsies positive for H. pylori.  Patient has not been treated due to drug allergies.   Heel spurs Left    MALONEY DILATION N/A 09/11/2016   Procedure: Venia Minks DILATION;  Surgeon: Daneil Dolin, MD;  [facility];  Service: Endoscopy;  Laterality: N/A;   POLYPECTOMY  08/13/2017   Procedure: POLYPECTOMY;  Surgeon: Daneil Dolin, MD;  [facility];  Service: Endoscopy;;  colon   POLYPECTOMY  01/18/2021   Procedure: POLYPECTOMY;  Surgeon: Daneil Dolin, MD;  [facility];  Service: Endoscopy;;   SHOULDER ARTHROSCOPY Right    TUBAL LIGATION      Current Outpatient Medications  Medication Sig Dispense Refill   acetaminophen (TYLENOL) 500 MG tablet Take 500 mg by mouth every 6 (six) hours as needed for mild pain or moderate pain.     apixaban (ELIQUIS) 5 MG TABS tablet Take 1 tablet (5 mg total) by mouth 2 (two) times daily. 60 tablet 11   atorvastatin (LIPITOR) 80 MG tablet Take 1 tablet (80 mg total) by mouth at bedtime. 90 tablet 3   calcium carbonate (OS-CAL - DOSED IN MG OF ELEMENTAL CALCIUM) 1250 (500 Ca) MG tablet Take 500 mg by mouth daily.     Cholecalciferol (VITAMIN D) 50 MCG (2000 UT) CAPS Take 1 tablet daily 30 capsule 2   diclofenac Sodium (VOLTAREN) 1 % GEL Apply 1 application topically 4 (four) times daily as needed  (pain).     dofetilide (TIKOSYN) 125 MCG capsule Take 1 capsule (125 mcg total) by mouth 2 (two) times daily. 60 capsule 6   ferrous sulfate 325 (65 FE) MG EC tablet Take 325 mg by mouth daily.     furosemide (LASIX) 40 MG tablet Take 1 tablet (40 mg total) by mouth 2 (two) times daily. 180 tablet 1   gabapentin (NEURONTIN) 300 MG capsule Take 300 mg by mouth 2 (two) times daily.      levothyroxine (SYNTHROID) 100 MCG tablet Take 1 tablet (100 mcg total) by mouth daily before breakfast. 90 tablet 3   loratadine (CLARITIN) 10 MG tablet Take 10 mg by mouth daily.     magnesium oxide (MAG-OX) 400 MG tablet Take 0.5 tablets (200 mg total) by mouth daily. 30 tablet 6   metoprolol tartrate (  LOPRESSOR) 25 MG tablet Take 25 mg by mouth 2 (two) times daily.     nitroGLYCERIN (NITROSTAT) 0.4 MG SL tablet DISSOLVE ONE TABLET UNDER THE TONGUE EVERY 5 MINUTES AS NEEDED FOR CHEST PAIN.  DO NOT EXCEED A TOTAL OF 3 DOSES IN 15 MINUTES (Patient taking differently: DISSOLVE ONE TABLET UNDER THE TONGUE EVERY 5 MINUTES AS NEEDED FOR CHEST PAIN.  DO NOT EXCEED A TOTAL OF 3 DOSES IN 15 MINUTES) 25 tablet 3   potassium chloride SA (KLOR-CON) 20 MEQ tablet Take 2 tablets (40 mEq total) by mouth daily. 180 tablet 1   ranolazine (RANEXA) 500 MG 12 hr tablet Take 1 tablet (500 mg total) by mouth 2 (two) times daily. 60 tablet 6   tiZANidine (ZANAFLEX) 2 MG tablet Take 1 tablet (2 mg total) by mouth every 6 (six) hours as needed for muscle spasms. 30 tablet 0   Vitamin D, Ergocalciferol, (DRISDOL) 1.25 MG (50000 UNIT) CAPS capsule Take 1 capsule (50,000 Units total) by mouth every 7 (seven) days. 12 capsule 0   No current facility-administered medications for this encounter.    Allergies  Allergen Reactions   Afrin [Oxymetazoline] Anaphylaxis   Amoxicillin Anaphylaxis   Lisinopril Anaphylaxis   Ranolazine Anaphylaxis    Dizzy, Reaction occurred with generic med   Vicodin [Hydrocodone-Acetaminophen] Diarrhea and Nausea  And Vomiting   Demerol [Meperidine] Nausea And Vomiting   Lodine [Etodolac] Rash    Social History   Socioeconomic History   Marital status: Single    Spouse name: Not on file   Number of children: Not on file   Years of education: Not on file   Highest education level: Not on file  Occupational History   Not on file  Tobacco Use   Smoking status: Former    Packs/day: 1.00    Years: 20.00    Pack years: 20.00    Types: Cigarettes    Start date: 12/02/1977    Quit date: 12/02/2002    Years since quitting: 18.5   Smokeless tobacco: Never  Vaping Use   Vaping Use: Never used  Substance and Sexual Activity   Alcohol use: No    Alcohol/week: 0.0 standard drinks   Drug use: No   Sexual activity: Not on file  Other Topics Concern   Not on file  Social History Narrative   Retired from [employer].   Social Determinants of Health   Financial Resource Strain: Not on file  Food Insecurity: Not on file  Transportation Needs: Not on file  Physical Activity: Not on file  Stress: Not on file  Social Connections: Not on file  Intimate Partner Violence: Not on file     ROS- All systems are reviewed and negative except as per the HPI above.  Physical Exam: Vitals:   06/29/21 1000  BP: 126/70  Pulse: (!) 59  Weight: 100.2 kg  Height: 5' (1.524 m)    GEN- The patient is a well appearing obese female, alert and oriented x 3 today.   HEENT-head normocephalic, atraumatic, sclera clear, conjunctiva pink, hearing intact, trachea midline. Lungs- Clear to ausculation bilaterally, normal work of breathing Heart- Regular rate and rhythm, no murmurs, rubs or gallops  GI- soft, NT, ND, + BS Extremities- no clubbing, cyanosis, 1+ bilateral edema MS- no significant deformity or atrophy Skin- no rash or lesion Psych- euthymic mood, full affect Neuro- strength and sensation are intact   Wt Readings from Last 3 Encounters:  06/29/21 100.2 kg  04/10/21 100.7 kg  03/27/21 101.2 kg     EKG today demonstrates  SB, PAC, NST Vent. rate 59 BPM PR interval 188 ms QRS duration 100 ms QT/QTcB 456/451 ms  Echo 08/29/20 demonstrated  1. Left ventricular ejection fraction, by estimation, is 40 to 45%. The  left ventricle has mildly decreased function. The left ventricle  demonstrates global hypokinesis. There is severe left ventricular  hypertrophy. Left ventricular diastolic parameters   are indeterminate.   2. Right ventricular systolic function is normal. The right ventricular  size is normal. There is moderately elevated pulmonary artery systolic  pressure.   3. Left atrial size was severely dilated.   4. Right atrial size was severely dilated.   5. The mitral valve is normal in structure. Mild to moderate mitral valve  regurgitation. No evidence of mitral stenosis.   6. Tricuspid valve regurgitation is moderate.   7. The aortic valve is tricuspid. Aortic valve regurgitation is mild to  moderate.   8. Moderate pulmonary HTN, PASP is 50 mmHg.   9. The inferior vena cava is dilated in size with <50% respiratory  variability, suggesting right atrial pressure of 15 mmHg.   Epic records are reviewed at length today  CHA2DS2-VASc Score = 5  The patient's score is based upon: CHF History: Yes HTN History: Yes Diabetes History: No Stroke History: No Vascular Disease History: Yes Age Score: 1 Gender Score: 1      ASSESSMENT AND PLAN: 1. Persistent Atrial Fibrillation (ICD10:  I48.19) The patient's CHA2DS2-VASc score is 5, indicating a 7.2% annual risk of stroke.   S/p dofetilide loading 11/9-11/14/21 Patient appears to be maintaining SR. Continue dofetilide 125 mcg BID. QT stable. Check bmet/mag Continue Eliquis 5 mg BID Continue Lopressor 25 mg BID  2. Secondary Hypercoagulable State (ICD10:  D68.69) The patient is at significant risk for stroke/thromboembolism based upon her CHA2DS2-VASc Score of 5.  Continue Apixaban (Eliquis).   3. Obesity Body mass  index is 43.16 kg/m. Lifestyle modification was discussed and encouraged including regular physical activity and weight reduction.  4. OSA Patient reports compliance with CPAP therapy.  5. Chronic combined systolic and diastolic CHF Chronic edema stable. We discussed the Alleviate HF trial today. She is interested in study but not ready to commit yet. Agreeable to chart review by research team.   6. CAD No anginal symptoms. Should not have up titration of Ranexa 2/2 QT.  7. HTN Stable, no changes today.   Follow up with Dr Domenic Polite as scheduled. AF clinic in 4 months.    [facility] [address] [place], [place] [address] [phone]

## 2021-06-29 NOTE — Progress Notes (Signed)
Informed patient about labs and rechecking K+ and sent new prescription with new dosage

## 2021-07-01 DIAGNOSIS — G4733 Obstructive sleep apnea (adult) (pediatric): Secondary | ICD-10-CM | POA: Diagnosis not present

## 2021-07-03 ENCOUNTER — Other Ambulatory Visit: Payer: Self-pay

## 2021-07-03 ENCOUNTER — Ambulatory Visit: Payer: Medicare Other | Admitting: Pulmonary Disease

## 2021-07-03 ENCOUNTER — Encounter: Payer: Self-pay | Admitting: Pulmonary Disease

## 2021-07-03 VITALS — BP 140/90 | HR 70 | Temp 97.2°F | Ht 60.0 in | Wt 221.0 lb

## 2021-07-03 DIAGNOSIS — E669 Obesity, unspecified: Secondary | ICD-10-CM

## 2021-07-03 DIAGNOSIS — G4730 Sleep apnea, unspecified: Secondary | ICD-10-CM | POA: Diagnosis not present

## 2021-07-03 DIAGNOSIS — F5101 Primary insomnia: Secondary | ICD-10-CM

## 2021-07-03 DIAGNOSIS — G4733 Obstructive sleep apnea (adult) (pediatric): Secondary | ICD-10-CM | POA: Diagnosis not present

## 2021-07-03 NOTE — Patient Instructions (Signed)
Follow up in 1 year.

## 2021-07-03 NOTE — Progress Notes (Signed)
[facility]  Chief Complaint  Patient presents with   Follow-up    Patient has received her CPAP and states that she still has some trouble sleeping if she does not take melatonin and wakes up around 3 am. Machine is working good.     Constitutional:  BP 140/90 (BP Location: Right Arm, Patient Position: Sitting, Cuff Size: Large)   Pulse 70   Temp (!) 97.2 F (36.2 C) (Oral)   Ht 5' (1.524 m)   Wt 221 lb (100.2 kg)   SpO2 98%   BMI 43.16 kg/m   Past Medical History:  CAD, CHF, HTN, HLD, A Fib, Hypothyroidism, Vit D deficiency  Past Surgical History:  Her  has a past surgical history that includes Tubal ligation; Cholecystectomy; Shoulder arthroscopy (Right); Heel spurs (Left); Cardiac catheterization (N/A, 06/06/2015); Esophagogastroduodenoscopy (N/A, 09/11/2016); maloney dilation (N/A, 09/11/2016); Colonoscopy; Colonoscopy (N/A, 08/13/2017); polypectomy (08/13/2017); Cardioversion (N/A, 09/14/2020); Esophagogastroduodenoscopy (egd) with propofol (N/A, 09/17/2020); biopsy (09/17/2020); Colonoscopy with propofol (N/A, 01/18/2021); polypectomy (01/18/2021); and Abdominal hysterectomy.  Brief Summary:  Maria Fry is a 70 y.o. female former smoker with obstructive sleep apnea and sleep maintenance insomnia      Subjective:   She had sleep study in February.  Showed mild sleep apnea.  Started on auto CPAP.  Has full face mask.  No issue with mask fit.  Pressure okay.  Needs to take melatonin at night.  If she doesn't, then she wakes up around 3 am.  Auto CPAP reports shows 6 hrs 34 minutes use per night with average AHI 2.3 and median CPAP 10 cm H2O.  Physical Exam:   Appearance - well kempt   ENMT - no sinus tenderness, no oral exudate, no LAN, Mallampati 4 airway, no stridor, wears dentures  Respiratory - equal breath sounds bilaterally, no wheezing or rales  CV - s1s2 regular rate and rhythm, no murmurs  Ext - no clubbing, no  edema  Skin - no rashes  Psych - normal mood and affect    Sleep Tests:  HST 01/29/21 >> AHI 9.9, SpO2 low 76%  Cardiac Tests:  Echo 08/29/20 >> EF 40 to 45%, severe LVH, mild/mod MR, severe LA/RA dilation, mild/mod TR, PASP 50 mmHg  Social History:  She  reports that she quit smoking about 18 years ago. Her smoking use included cigarettes. She started smoking about 43 years ago. She has a 20.00 pack-year smoking history. She has never used smokeless tobacco. She reports that she does not drink alcohol and does not use drugs.  Family History:  Her family history includes Cancer in her brother; Heart attack in her brother and father; Heart failure in her brother and father; Stroke in her sister.     Assessment/Plan:   Obstructive sleep apnea. - she is compliant with therapy and reports benefit from CPAP - she uses [facility] as her DME - continue auto CPAP 5 to 15 cm H2O  Obesity. - discussed importance of weight loss  Persistent atrial fibrillation, Chronic diastolic CHF, Coronary artery disease. - she is followed by Dr. Johnny Bridge with [facility]  Time Spent Involved in Patient Care on Day of Examination:  22 minutes  Follow up:   Patient Instructions  Follow up in 1 year  Medication List:   Allergies as of 07/03/2021       Reactions   Afrin [oxymetazoline] Anaphylaxis   Amoxicillin Anaphylaxis   Lisinopril Anaphylaxis   Ranolazine Anaphylaxis  Dizzy, Reaction occurred with generic med   Vicodin [hydrocodone-acetaminophen] Diarrhea, Nausea And Vomiting   Demerol [meperidine] Nausea And Vomiting   Lodine [etodolac] Rash        Medication List        Accurate as of July 03, 2021 11:05 AM. If you have any questions, ask your nurse or doctor.          acetaminophen 500 MG tablet Commonly known as: TYLENOL Take 500 mg by mouth every 6 (six) hours as needed for mild pain or moderate pain.   apixaban 5 MG Tabs tablet Commonly known as:  Eliquis Take 1 tablet (5 mg total) by mouth 2 (two) times daily.   atorvastatin 80 MG tablet Commonly known as: LIPITOR Take 1 tablet (80 mg total) by mouth at bedtime.   calcium carbonate 1250 (500 Ca) MG tablet Commonly known as: OS-CAL - dosed in mg of elemental calcium Take 500 mg by mouth daily.   diclofenac Sodium 1 % Gel Commonly known as: VOLTAREN Apply 1 application topically 4 (four) times daily as needed (pain).   dofetilide 125 MCG capsule Commonly known as: TIKOSYN Take 1 capsule (125 mcg total) by mouth 2 (two) times daily.   ferrous sulfate 325 (65 FE) MG EC tablet Take 325 mg by mouth daily.   furosemide 40 MG tablet Commonly known as: Lasix Take 1 tablet (40 mg total) by mouth 2 (two) times daily.   gabapentin 300 MG capsule Commonly known as: NEURONTIN Take 300 mg by mouth 2 (two) times daily.   levothyroxine 100 MCG tablet Commonly known as: SYNTHROID Take 1 tablet (100 mcg total) by mouth daily before breakfast.   loratadine 10 MG tablet Commonly known as: CLARITIN Take 10 mg by mouth daily.   magnesium oxide 400 MG tablet Commonly known as: MAG-OX Take 0.5 tablets (200 mg total) by mouth daily.   melatonin 3 MG Tabs tablet Take 3 mg by mouth at bedtime.   metoprolol tartrate 25 MG tablet Commonly known as: LOPRESSOR Take 25 mg by mouth 2 (two) times daily.   nitroGLYCERIN 0.4 MG SL tablet Commonly known as: NITROSTAT DISSOLVE ONE TABLET UNDER THE TONGUE EVERY 5 MINUTES AS NEEDED FOR CHEST PAIN.  DO NOT EXCEED A TOTAL OF 3 DOSES IN 15 MINUTES   potassium chloride SA 20 MEQ tablet Commonly known as: KLOR-CON Take 3 tablets (60 mEq total) by mouth daily.   ranolazine 500 MG 12 hr tablet Commonly known as: Ranexa Take 1 tablet (500 mg total) by mouth 2 (two) times daily.   tiZANidine 2 MG tablet Commonly known as: ZANAFLEX Take 1 tablet (2 mg total) by mouth every 6 (six) hours as needed for muscle spasms.   Vitamin D (Ergocalciferol)  1.25 MG (50000 UNIT) Caps capsule Commonly known as: DRISDOL Take 1 capsule (50,000 Units total) by mouth every 7 (seven) days.   Vitamin D 50 MCG (2000 UT) Caps Take 1 tablet daily        Signature:  Chesley Mires, MD [facility] Pager - [phone] 07/03/2021, 11:05 AM

## 2021-07-13 ENCOUNTER — Ambulatory Visit (HOSPITAL_COMMUNITY)
Admission: RE | Admit: 2021-07-13 | Discharge: 2021-07-13 | Disposition: A | Discharge status: Home / Self Care | Payer: Medicare Other | Source: Ambulatory Visit | Attending: Physician Assistant | Admitting: Physician Assistant

## 2021-07-13 ENCOUNTER — Other Ambulatory Visit: Payer: Self-pay

## 2021-07-13 DIAGNOSIS — I4819 Other persistent atrial fibrillation: Secondary | ICD-10-CM | POA: Diagnosis not present

## 2021-07-13 LAB — BASIC METABOLIC PANEL
Anion gap: 5 (ref 5–15)
BUN: 13 mg/dL (ref 8–23)
CO2: 26 mmol/L (ref 22–32)
Calcium: 8.2 mg/dL — ABNORMAL LOW (ref 8.9–10.3)
Chloride: 105 mmol/L (ref 98–111)
Creatinine, Ser: 0.89 mg/dL (ref 0.44–1.00)
GFR, Estimated: >60 mL/min (ref >60)
Glucose, Bld: 95 mg/dL (ref 70–99)
Potassium: 4 mmol/L (ref 3.5–5.1)
Sodium: 136 mmol/L (ref 135–145)

## 2021-07-24 ENCOUNTER — Encounter: Payer: Self-pay | Admitting: Cardiology

## 2021-07-24 ENCOUNTER — Ambulatory Visit: Payer: Medicare Other | Admitting: Cardiology

## 2021-07-24 ENCOUNTER — Telehealth: Payer: Self-pay | Admitting: Cardiology

## 2021-07-24 VITALS — BP 132/64 | HR 60 | Ht 60.0 in | Wt 220.4 lb

## 2021-07-24 DIAGNOSIS — I5032 Chronic diastolic (congestive) heart failure: Secondary | ICD-10-CM

## 2021-07-24 DIAGNOSIS — I4819 Other persistent atrial fibrillation: Secondary | ICD-10-CM | POA: Diagnosis not present

## 2021-07-24 DIAGNOSIS — Z79899 Other long term (current) drug therapy: Secondary | ICD-10-CM

## 2021-07-24 DIAGNOSIS — I25119 Atherosclerotic heart disease of native coronary artery with unspecified angina pectoris: Secondary | ICD-10-CM | POA: Diagnosis not present

## 2021-07-24 MED ORDER — SPIRONOLACTONE 25 MG PO TABS
12.5000 mg | ORAL_TABLET | Freq: Every day | ORAL | 1 refills | Status: DC
Start: 2021-07-24 — End: 2022-06-05

## 2021-07-24 NOTE — Patient Instructions (Addendum)
Medication Instructions:  Your physician has recommended you make the following change in your medication:  Start spironolactone 12.5 mg daily Continue other medications the same  Labwork: BMET in 2 weeks-[facility]  Testing/Procedures: none  Follow-Up: Your physician recommends that you schedule a follow-up appointment in: 6 months  Any Other Special Instructions Will Be Listed Below (If Applicable).  If you need a refill on your cardiac medications before your next appointment, please call your pharmacy.

## 2021-07-24 NOTE — Telephone Encounter (Signed)
Advised that its okay to fill w/pending bmet in 2 weeks-adjustments can be made at that time

## 2021-07-24 NOTE — Progress Notes (Signed)
Cardiology Office Note  Date: 07/24/2021   ID: Sereen, Schnebly 05/16/1951, MRN [id number]  PCP:  Noreene Larsson, NP  Cardiologist:  Rozann Lesches, MD Electrophysiologist:  Thompson Grayer, MD   Chief Complaint  Patient presents with   Cardiac follow-up    History of Present Illness: Maria Fry is a 70 y.o. female last seen in May.  She had more recent follow-up in the atrial fibrillation clinic in July, I reviewed the note.  She is here today for a routine visit.  Continues to do well in terms of rhythm control, no significant palpitations.  She reports dyspnea on exertion and leg swelling, but no angina symptoms.  Her weight is stable.  CHA2DS2-VASc score is 5.  I personally reviewed her ECG today which shows sinus bradycardia with nonspecific ST-T changes and QTC 440 ms. Recent lab work is outlined below.  We reviewed her medications which are noted below.  She cannot tolerate generic form of Ranexa due to dizziness and weakness, I discussed with nursing regarding clarification of preauthorization with insurance company.  We also discussed addition of Aldactone to present regimen.  She does not report any spontaneous bleeding problems on Eliquis.  Past Medical History:  Diagnosis Date   Arthritis    Right knee   Atrial fibrillation (HCC)    CAD (coronary artery disease)    a. 06/07/15 NSTEMI/Cath: Dist LAD 100%, mid LAD 10%. Otw nl cors. Nl EF w/ inferoapical AK.   Essential hypertension    Hip pain    History of cardiomyopathy 04/23/2016   a. 04/2016 Echo: EF 45%, Gr2 DD; b. 11/2019 Echo: EF 45-50%, mod LVH. Gr2 DD. Nl RV fxn. Sev dil LA.    Hyperlipidemia    Hypothyroidism    NSTEMI (non-ST elevated myocardial infarction) ([place]) 06/05/2015   Vitamin D deficiency     Past Surgical History:  Procedure Laterality Date   ABDOMINAL HYSTERECTOMY     partial- one ovary remaining   BIOPSY  09/17/2020   Procedure: BIOPSY;  Surgeon: [name], DO;  [facility];  Service: Endoscopy;;   CARDIAC CATHETERIZATION N/A 06/06/2015   Procedure: Left Heart Cath and Coronary Angiography;  Surgeon: Peter M Martinique, MD;  Location: [facility];  Service: Cardiovascular;  Laterality: N/A;   CARDIOVERSION N/A 09/14/2020   Procedure: CARDIOVERSION;  Surgeon: Satira Sark, MD;  [facility];  Service: Cardiovascular;  Laterality: N/A;   CHOLECYSTECTOMY     COLONOSCOPY     COLONOSCOPY N/A 08/13/2017   Procedure: COLONOSCOPY;  Surgeon: Daneil Dolin, MD;  [facility];  Service: Endoscopy;  Laterality: N/A;  8:30 AM   COLONOSCOPY WITH PROPOFOL N/A 01/18/2021   [name]: Multiple tubular adenomas removed, next colonoscopy in 3 years   ESOPHAGOGASTRODUODENOSCOPY N/A 09/11/2016   Procedure: ESOPHAGOGASTRODUODENOSCOPY (EGD);  Surgeon: Daneil Dolin, MD;  [facility];  Service: Endoscopy;  Laterality: N/A;  7:45 am - moved to 10/11 @ 10:30   ESOPHAGOGASTRODUODENOSCOPY (EGD) WITH PROPOFOL N/A 09/17/2020   [name]: Diffuse moderate inflammation characterized by erosions and erythema found in the entire stomach.  Biopsies positive for H. pylori.  Patient has not been treated due to drug allergies.   Heel spurs Left    MALONEY DILATION N/A 09/11/2016   Procedure: Venia Minks DILATION;  Surgeon: Daneil Dolin, MD;  [facility];  Service: Endoscopy;  Laterality: N/A;   POLYPECTOMY  08/13/2017   Procedure:  POLYPECTOMY;  Surgeon: Daneil Dolin, MD;  [facility];  Service: Endoscopy;;  colon   POLYPECTOMY  01/18/2021   Procedure: POLYPECTOMY;  Surgeon: Daneil Dolin, MD;  [facility];  Service: Endoscopy;;   SHOULDER ARTHROSCOPY Right    TUBAL LIGATION      Current Outpatient Medications  Medication Sig Dispense Refill   acetaminophen (TYLENOL) 500 MG tablet Take 500 mg by mouth every 6 (six) hours as needed for mild pain or moderate pain.     apixaban (ELIQUIS) 5 MG TABS tablet Take 1 tablet (5  mg total) by mouth 2 (two) times daily. 60 tablet 11   atorvastatin (LIPITOR) 80 MG tablet Take 1 tablet (80 mg total) by mouth at bedtime. 90 tablet 3   calcium carbonate (OS-CAL - DOSED IN MG OF ELEMENTAL CALCIUM) 1250 (500 Ca) MG tablet Take 500 mg by mouth daily.     Cholecalciferol (VITAMIN D) 50 MCG (2000 UT) CAPS Take 1 tablet daily 30 capsule 2   diclofenac Sodium (VOLTAREN) 1 % GEL Apply 1 application topically 4 (four) times daily as needed (pain).     dofetilide (TIKOSYN) 125 MCG capsule Take 1 capsule (125 mcg total) by mouth 2 (two) times daily. 60 capsule 6   ferrous sulfate 325 (65 FE) MG EC tablet Take 325 mg by mouth daily.     furosemide (LASIX) 40 MG tablet Take 1 tablet (40 mg total) by mouth 2 (two) times daily. 180 tablet 1   gabapentin (NEURONTIN) 300 MG capsule Take 300 mg by mouth 2 (two) times daily.      levothyroxine (SYNTHROID) 100 MCG tablet Take 1 tablet (100 mcg total) by mouth daily before breakfast. 90 tablet 3   loratadine (CLARITIN) 10 MG tablet Take 10 mg by mouth daily.     magnesium oxide (MAG-OX) 400 MG tablet Take 0.5 tablets (200 mg total) by mouth daily. 30 tablet 6   melatonin 3 MG TABS tablet Take 3 mg by mouth at bedtime.     metoprolol tartrate (LOPRESSOR) 25 MG tablet Take 25 mg by mouth 2 (two) times daily.     nitroGLYCERIN (NITROSTAT) 0.4 MG SL tablet DISSOLVE ONE TABLET UNDER THE TONGUE EVERY 5 MINUTES AS NEEDED FOR CHEST PAIN.  DO NOT EXCEED A TOTAL OF 3 DOSES IN 15 MINUTES (Patient taking differently: DISSOLVE ONE TABLET UNDER THE TONGUE EVERY 5 MINUTES AS NEEDED FOR CHEST PAIN.  DO NOT EXCEED A TOTAL OF 3 DOSES IN 15 MINUTES) 25 tablet 3   potassium chloride SA (KLOR-CON) 20 MEQ tablet Take 3 tablets (60 mEq total) by mouth daily. 90 tablet 1   ranolazine (RANEXA) 500 MG 12 hr tablet Take 1 tablet (500 mg total) by mouth 2 (two) times daily. 60 tablet 6   spironolactone (ALDACTONE) 25 MG tablet Take 0.5 tablets (12.5 mg total) by mouth daily. 45  tablet 1   Vitamin D, Ergocalciferol, (DRISDOL) 1.25 MG (50000 UNIT) CAPS capsule Take 1 capsule (50,000 Units total) by mouth every 7 (seven) days. 12 capsule 0   tiZANidine (ZANAFLEX) 2 MG tablet Take 1 tablet (2 mg total) by mouth every 6 (six) hours as needed for muscle spasms. (Patient not taking: Reported on 07/24/2021) 30 tablet 0   No current facility-administered medications for this visit.   Allergies:  Afrin [oxymetazoline], Amoxicillin, Lisinopril, Ranolazine, Vicodin [hydrocodone-acetaminophen], Demerol [meperidine], and Lodine [etodolac]   ROS: No syncope.  Physical Exam: VS:  BP 132/64   Pulse 60  Ht 5' (1.524 m)   Wt 220 lb 6.4 oz (100 kg)   SpO2 97%   BMI 43.04 kg/m , BMI Body mass index is 43.04 kg/m.  Wt Readings from Last 3 Encounters:  07/24/21 220 lb 6.4 oz (100 kg)  07/03/21 221 lb (100.2 kg)  06/29/21 221 lb (100.2 kg)    General: Patient appears comfortable at rest. HEENT: Conjunctiva and lids normal, wearing a mask. Neck: Supple, no elevated JVP or carotid bruits, no thyromegaly. Lungs: Clear to auscultation, nonlabored breathing at rest. Cardiac: Regular rate and rhythm, no S3, 1/6 systolic murmur, no pericardial rub. Extremities: 2+ lower leg edema.  ECG:  An ECG dated 06/29/2021 was personally reviewed today and demonstrated:  Sinus bradycardia with PAC, nonspecific ST changes, QTC 451 ms.  Recent Labwork: 09/15/2020: B Natriuretic Peptide 1,278.0 01/10/2021: TSH 4.010 03/02/2021: ALT 10; AST 16; Hemoglobin 10.9; Platelets 234 06/29/2021: Magnesium 2.1 07/13/2021: BUN 13; Creatinine, Ser 0.89; Potassium 4.0; Sodium 136     Component Value Date/Time   CHOL 128 03/02/2021 0926   TRIG 50 03/02/2021 0926   HDL 46 03/02/2021 0926   CHOLHDL 2.7 01/03/2020 0948   VLDL 12 06/23/2017 0902   LDLCALC 71 03/02/2021 0926   LDLCALC 63 01/03/2020 0948    Other Studies Reviewed Today:  Echocardiogram 02/08/2021:  1. Left ventricular ejection fraction, by  estimation, is 50 to 55%. The  left ventricle has low normal function. The left ventricle has no regional  wall motion abnormalities. The left ventricular internal cavity size was  mildly dilated. There is mild  left ventricular hypertrophy. Left ventricular diastolic parameters are  indeterminate.   2. Right ventricular systolic function is normal. The right ventricular  size is normal.   3. Left atrial size was severely dilated.   4. Right atrial size was moderately dilated.   5. The mitral valve is abnormal. Mild to moderate mitral valve  regurgitation. No evidence of mitral stenosis.   6. The aortic valve is tricuspid. There is mild calcification of the  aortic valve. There is mild thickening of the aortic valve. Aortic valve  regurgitation is mild. No aortic stenosis is present.   7. Moderate pulmonary hypertension, PASP is 52 mmHg.   8. The inferior vena cava is normal in size with greater than 50%  respiratory variability, suggesting right atrial pressure of 3 mmHg.   Assessment and Plan:  1.  Persistent atrial fibrillation with CHA2DS2-VASc score of 5.  She is doing well in terms of rhythm control on Tikosyn, also on Lopressor and Eliquis for stroke prophylaxis.  No spontaneous bleeding problems reported.  ECG reviewed today with QTC 440 ms.  She had recent lab work showing normal potassium and magnesium.  2.  Chronic diastolic heart failure, LVEF 50 to 55% by echocardiogram in March, also with elevated PASP 52 mmHg.  She remains on Lasix with potassium supplement.  Adding Aldactone 12.5 mg daily with follow-up BMET in 2 weeks.  3.  CAD with occluded distal LAD being managed medically.  She is on Ranexa, Lopressor, and Lipitor.  Medication Adjustments/Labs and Tests Ordered: Current medicines are reviewed at length with the patient today.  Concerns regarding medicines are outlined above.   Tests Ordered: Orders Placed This Encounter  Procedures   Basic metabolic panel   EKG  [id number]    Medication Changes: Meds ordered this encounter  Medications   spironolactone (ALDACTONE) 25 MG tablet    Sig: Take 0.5 tablets (12.5 mg total) by mouth  daily.    Dispense:  45 tablet    Refill:  1    07/24/2021 NEW     Disposition:  Follow up  6 months.  Signed, Satira Sark, MD, [facility] 07/24/2021 10:43 AM    [facility] [place], [place], [place] [address] Phone: [phone]; Fax: [phone]

## 2021-07-24 NOTE — Telephone Encounter (Signed)
Amanda @ [address] called about a  drug interaction with the Spironolactone and the Potassium that the patient currently takes. She can be reached at [phone].

## 2021-08-01 DIAGNOSIS — G4733 Obstructive sleep apnea (adult) (pediatric): Secondary | ICD-10-CM | POA: Diagnosis not present

## 2021-08-03 ENCOUNTER — Telehealth: Payer: Self-pay | Admitting: Nurse Practitioner

## 2021-08-03 NOTE — Telephone Encounter (Signed)
Left message for patient to call back and schedule Medicare Annual Wellness Visit (AWV) in office.   If unable to come into the office for AWV,  please offer to do virtually or by telephone.  Last AWV: 01/03/2020  Please schedule at anytime with [facility].  40 minute appointment  Any questions, please contact me at [phone]

## 2021-08-07 DIAGNOSIS — I5032 Chronic diastolic (congestive) heart failure: Secondary | ICD-10-CM | POA: Diagnosis not present

## 2021-08-07 DIAGNOSIS — I4819 Other persistent atrial fibrillation: Secondary | ICD-10-CM | POA: Diagnosis not present

## 2021-08-07 DIAGNOSIS — Z79899 Other long term (current) drug therapy: Secondary | ICD-10-CM | POA: Diagnosis not present

## 2021-08-08 LAB — BASIC METABOLIC PANEL
BUN/Creatinine Ratio: 14 (ref 12–28)
BUN: 15 mg/dL (ref 8–27)
CO2: 23 mmol/L (ref 20–29)
Calcium: 8.9 mg/dL (ref 8.7–10.3)
Chloride: 103 mmol/L (ref 96–106)
Creatinine, Ser: 1.1 mg/dL — ABNORMAL HIGH (ref 0.57–1.00)
Glucose: 94 mg/dL (ref 65–99)
Potassium: 4.4 mmol/L (ref 3.5–5.2)
Sodium: 138 mmol/L (ref 134–144)
eGFR: 55 mL/min/{1.73_m2} — ABNORMAL LOW (ref >59)

## 2021-08-09 ENCOUNTER — Telehealth: Payer: Self-pay | Admitting: *Deleted

## 2021-08-09 DIAGNOSIS — I4819 Other persistent atrial fibrillation: Secondary | ICD-10-CM

## 2021-08-09 DIAGNOSIS — Z79899 Other long term (current) drug therapy: Secondary | ICD-10-CM

## 2021-08-09 NOTE — Telephone Encounter (Signed)
-----   Message from Satira Sark, MD sent at 08/08/2021  8:16 AM EDT ----- Results reviewed.  Creatinine has bumped up slightly to 1.1 from 0.89, but potassium is normal at 4.4.  This is after recent addition of Aldactone.  Would continue same for now and plan a follow-up BMET for her next office visit.

## 2021-08-09 NOTE — Telephone Encounter (Signed)
Patient informed. Copy sent to PCP Lab order placed for [employer]

## 2021-08-30 ENCOUNTER — Telehealth: Payer: Self-pay | Admitting: Nurse Practitioner

## 2021-08-30 NOTE — Telephone Encounter (Signed)
Left message for patient to call back and schedule Medicare Annual Wellness Visit (AWV) in office.   If unable to come into the office for AWV,  please offer to do virtually or by telephone.  Last AWV: 01/03/2020  Please schedule at anytime with [place].  40 minute appointment  Any questions, please contact me at [phone]

## 2021-08-31 DIAGNOSIS — G4733 Obstructive sleep apnea (adult) (pediatric): Secondary | ICD-10-CM | POA: Diagnosis not present

## 2021-09-05 ENCOUNTER — Encounter (INDEPENDENT_AMBULATORY_CARE_PROVIDER_SITE_OTHER): Payer: Self-pay

## 2021-09-05 ENCOUNTER — Encounter: Payer: Self-pay | Admitting: Nurse Practitioner

## 2021-09-05 ENCOUNTER — Other Ambulatory Visit: Payer: Self-pay

## 2021-09-05 ENCOUNTER — Ambulatory Visit (INDEPENDENT_AMBULATORY_CARE_PROVIDER_SITE_OTHER): Payer: Medicare Other | Admitting: Nurse Practitioner

## 2021-09-05 VITALS — BP 143/80 | HR 66 | Temp 97.9°F | Ht 60.0 in | Wt 224.0 lb

## 2021-09-05 DIAGNOSIS — I10 Essential (primary) hypertension: Secondary | ICD-10-CM

## 2021-09-05 DIAGNOSIS — Z23 Encounter for immunization: Secondary | ICD-10-CM

## 2021-09-05 DIAGNOSIS — E785 Hyperlipidemia, unspecified: Secondary | ICD-10-CM

## 2021-09-05 DIAGNOSIS — E559 Vitamin D deficiency, unspecified: Secondary | ICD-10-CM

## 2021-09-05 DIAGNOSIS — I4819 Other persistent atrial fibrillation: Secondary | ICD-10-CM

## 2021-09-05 DIAGNOSIS — E612 Magnesium deficiency: Secondary | ICD-10-CM

## 2021-09-05 DIAGNOSIS — I5043 Acute on chronic combined systolic (congestive) and diastolic (congestive) heart failure: Secondary | ICD-10-CM

## 2021-09-05 NOTE — Assessment & Plan Note (Signed)
-  regular rhythm on exam today

## 2021-09-05 NOTE — Assessment & Plan Note (Signed)
-  doing well -followed by cardiology -no ankle edema today

## 2021-09-05 NOTE — Assessment & Plan Note (Signed)
-  check vit D level

## 2021-09-05 NOTE — Assessment & Plan Note (Signed)
-  takes mag supplement -checking mag level today

## 2021-09-05 NOTE — Assessment & Plan Note (Signed)
BP Readings from Last 3 Encounters:  09/05/21 (!) 143/80  07/24/21 132/64  07/03/21 140/90   -managed by cardiology -generally well-controlled

## 2021-09-05 NOTE — Progress Notes (Signed)
Acute Office Visit  Subjective:    Patient ID: Maria Fry, female    DOB: Jan 01, 1951, 70 y.o.   MRN: [id number]  Chief Complaint  Patient presents with   Follow-up    6 month lab follow up     HPI Patient is in today for lab follow-up for CHF, vit D deficiency, and HLD.  No leg swelling or SOB reported.No adverse med effects. She sees endocrinology for thyroid issues.  Past Medical History:  Diagnosis Date   Arthritis    Right knee   Atrial fibrillation (HCC)    CAD (coronary artery disease)    a. 06/07/15 NSTEMI/Cath: Dist LAD 100%, mid LAD 10%. Otw nl cors. Nl EF w/ inferoapical AK.   Essential hypertension    Hip pain    History of cardiomyopathy 04/23/2016   a. 04/2016 Echo: EF 45%, Gr2 DD; b. 11/2019 Echo: EF 45-50%, mod LVH. Gr2 DD. Nl RV fxn. Sev dil LA.    Hyperlipidemia    Hypothyroidism    NSTEMI (non-ST elevated myocardial infarction) ([place]) 06/05/2015   Vitamin D deficiency     Past Surgical History:  Procedure Laterality Date   ABDOMINAL HYSTERECTOMY     partial- one ovary remaining   BIOPSY  09/17/2020   Procedure: BIOPSY;  Surgeon: [name], DO;  [facility];  Service: Endoscopy;;   CARDIAC CATHETERIZATION N/A 06/06/2015   Procedure: Left Heart Cath and Coronary Angiography;  Surgeon: Peter M Martinique, MD;  Location: [facility];  Service: Cardiovascular;  Laterality: N/A;   CARDIOVERSION N/A 09/14/2020   Procedure: CARDIOVERSION;  Surgeon: Satira Sark, MD;  [facility];  Service: Cardiovascular;  Laterality: N/A;   CHOLECYSTECTOMY     COLONOSCOPY     COLONOSCOPY N/A 08/13/2017   Procedure: COLONOSCOPY;  Surgeon: Daneil Dolin, MD;  [facility];  Service: Endoscopy;  Laterality: N/A;  8:30 AM   COLONOSCOPY WITH PROPOFOL N/A 01/18/2021   [name]: Multiple tubular adenomas removed, next colonoscopy in 3 years   ESOPHAGOGASTRODUODENOSCOPY N/A 09/11/2016   Procedure: ESOPHAGOGASTRODUODENOSCOPY (EGD);   Surgeon: Daneil Dolin, MD;  [facility];  Service: Endoscopy;  Laterality: N/A;  7:45 am - moved to 10/11 @ 10:30   ESOPHAGOGASTRODUODENOSCOPY (EGD) WITH PROPOFOL N/A 09/17/2020   [name]: Diffuse moderate inflammation characterized by erosions and erythema found in the entire stomach.  Biopsies positive for H. pylori.  Patient has not been treated due to drug allergies.   Heel spurs Left    MALONEY DILATION N/A 09/11/2016   Procedure: Venia Minks DILATION;  Surgeon: Daneil Dolin, MD;  [facility];  Service: Endoscopy;  Laterality: N/A;   POLYPECTOMY  08/13/2017   Procedure: POLYPECTOMY;  Surgeon: Daneil Dolin, MD;  [facility];  Service: Endoscopy;;  colon   POLYPECTOMY  01/18/2021   Procedure: POLYPECTOMY;  Surgeon: Daneil Dolin, MD;  [facility];  Service: Endoscopy;;   SHOULDER ARTHROSCOPY Right    TUBAL LIGATION      Family History  Problem Relation Age of Onset   Heart failure Father        Deceased   Heart attack Father        Deceased   Stroke Sister        Deceased   Heart failure Brother        Deceased   Cancer Brother        Deceased   Heart  attack Brother        Deceased   Colon cancer Neg Hx     Social History   Socioeconomic History   Marital status: Single    Spouse name: Not on file   Number of children: Not on file   Years of education: Not on file   Highest education level: Not on file  Occupational History   Not on file  Tobacco Use   Smoking status: Former    Packs/day: 1.00    Years: 20.00    Pack years: 20.00    Types: Cigarettes    Start date: 12/02/1977    Quit date: 12/02/2002    Years since quitting: 18.7   Smokeless tobacco: Never  Vaping Use   Vaping Use: Never used  Substance and Sexual Activity   Alcohol use: No    Alcohol/week: 0.0 standard drinks   Drug use: No   Sexual activity: Not on file  Other Topics Concern   Not on file  Social History Narrative   Retired from [employer].    Social Determinants of Health   Financial Resource Strain: Not on file  Food Insecurity: Not on file  Transportation Needs: Not on file  Physical Activity: Not on file  Stress: Not on file  Social Connections: Not on file  Intimate Partner Violence: Not on file    Outpatient Medications Prior to Visit  Medication Sig Dispense Refill   acetaminophen (TYLENOL) 500 MG tablet Take 500 mg by mouth every 6 (six) hours as needed for mild pain or moderate pain.     apixaban (ELIQUIS) 5 MG TABS tablet Take 1 tablet (5 mg total) by mouth 2 (two) times daily. 60 tablet 11   atorvastatin (LIPITOR) 80 MG tablet Take 1 tablet (80 mg total) by mouth at bedtime. 90 tablet 3   calcium carbonate (OS-CAL - DOSED IN MG OF ELEMENTAL CALCIUM) 1250 (500 Ca) MG tablet Take 500 mg by mouth daily.     Cholecalciferol (VITAMIN D) 50 MCG (2000 UT) CAPS Take 1 tablet daily 30 capsule 2   diclofenac Sodium (VOLTAREN) 1 % GEL Apply 1 application topically 4 (four) times daily as needed (pain).     dofetilide (TIKOSYN) 125 MCG capsule Take 1 capsule (125 mcg total) by mouth 2 (two) times daily. 60 capsule 6   ferrous sulfate 325 (65 FE) MG EC tablet Take 325 mg by mouth daily.     furosemide (LASIX) 40 MG tablet Take 1 tablet (40 mg total) by mouth 2 (two) times daily. 180 tablet 1   gabapentin (NEURONTIN) 300 MG capsule Take 300 mg by mouth 2 (two) times daily.      levothyroxine (SYNTHROID) 100 MCG tablet Take 1 tablet (100 mcg total) by mouth daily before breakfast. 90 tablet 3   loratadine (CLARITIN) 10 MG tablet Take 10 mg by mouth daily.     magnesium oxide (MAG-OX) 400 MG tablet Take 0.5 tablets (200 mg total) by mouth daily. 30 tablet 6   melatonin 3 MG TABS tablet Take 3 mg by mouth at bedtime.     metoprolol tartrate (LOPRESSOR) 25 MG tablet Take 25 mg by mouth 2 (two) times daily.     nitroGLYCERIN (NITROSTAT) 0.4 MG SL tablet DISSOLVE ONE TABLET UNDER THE TONGUE EVERY 5 MINUTES AS NEEDED FOR CHEST PAIN.   DO NOT EXCEED A TOTAL OF 3 DOSES IN 15 MINUTES (Patient taking differently: DISSOLVE ONE TABLET UNDER THE TONGUE EVERY 5 MINUTES AS NEEDED FOR CHEST PAIN.  DO NOT EXCEED A TOTAL OF 3 DOSES IN 15 MINUTES) 25 tablet 3   potassium chloride SA (KLOR-CON) 20 MEQ tablet Take 3 tablets (60 mEq total) by mouth daily. 90 tablet 1   ranolazine (RANEXA) 500 MG 12 hr tablet Take 1 tablet (500 mg total) by mouth 2 (two) times daily. 60 tablet 6   spironolactone (ALDACTONE) 25 MG tablet Take 0.5 tablets (12.5 mg total) by mouth daily. 45 tablet 1   tiZANidine (ZANAFLEX) 2 MG tablet Take 1 tablet (2 mg total) by mouth every 6 (six) hours as needed for muscle spasms. 30 tablet 0   Vitamin D, Ergocalciferol, (DRISDOL) 1.25 MG (50000 UNIT) CAPS capsule Take 1 capsule (50,000 Units total) by mouth every 7 (seven) days. 12 capsule 0   No facility-administered medications prior to visit.    Allergies  Allergen Reactions   Afrin [Oxymetazoline] Anaphylaxis   Amoxicillin Anaphylaxis   Lisinopril Anaphylaxis   Ranolazine Anaphylaxis    Dizzy, Reaction occurred with generic med   Vicodin [Hydrocodone-Acetaminophen] Diarrhea and Nausea And Vomiting   Demerol [Meperidine] Nausea And Vomiting   Lodine [Etodolac] Rash    Review of Systems  Constitutional: Negative.   Respiratory: Negative.    Cardiovascular: Negative.   Musculoskeletal: Negative.   Psychiatric/Behavioral: Negative.        Objective:    Physical Exam Constitutional:      Appearance: Normal appearance. She is obese.  Cardiovascular:     Rate and Rhythm: Normal rate and regular rhythm.     Pulses: Normal pulses.     Heart sounds: Normal heart sounds.  Pulmonary:     Effort: Pulmonary effort is normal.     Breath sounds: Normal breath sounds.  Musculoskeletal:        General: Normal range of motion.  Neurological:     Mental Status: She is alert.  Psychiatric:        Mood and Affect: Mood normal.        Behavior: Behavior normal.         Thought Content: Thought content normal.        Judgment: Judgment normal.    BP (!) 143/80 (BP Location: Right Arm, Patient Position: Sitting, Cuff Size: Large)   Pulse 66   Temp 97.9 F (36.6 C) (Oral)   Ht 5' (1.524 m)   Wt 224 lb 0.6 oz (101.6 kg)   SpO2 95%   BMI 43.75 kg/m  Wt Readings from Last 3 Encounters:  09/05/21 224 lb 0.6 oz (101.6 kg)  07/24/21 220 lb 6.4 oz (100 kg)  07/03/21 221 lb (100.2 kg)    Health Maintenance Due  Topic Date Due   Zoster Vaccines- Shingrix (1 of 2) Never done   TETANUS/TDAP  12/02/2020   MAMMOGRAM  02/02/2021    There are no preventive care reminders to display for this patient.   Lab Results  Component Value Date   TSH 4.010 01/10/2021   Lab Results  Component Value Date   WBC 7.5 03/02/2021   HGB 10.9 (L) 03/02/2021   HCT 34.1 03/02/2021   MCV 96 03/02/2021   PLT 234 03/02/2021   Lab Results  Component Value Date   NA 138 08/07/2021   K 4.4 08/07/2021   CO2 23 08/07/2021   GLUCOSE 94 08/07/2021   BUN 15 08/07/2021   CREATININE 1.10 (H) 08/07/2021   BILITOT 0.4 03/02/2021   ALKPHOS 93 03/02/2021   AST 16 03/02/2021   ALT 10 03/02/2021  PROT 7.1 03/02/2021   ALBUMIN 3.7 (L) 03/02/2021   CALCIUM 8.9 08/07/2021   ANIONGAP 5 07/13/2021   EGFR 55 (L) 08/07/2021   Lab Results  Component Value Date   CHOL 128 03/02/2021   Lab Results  Component Value Date   HDL 46 03/02/2021   Lab Results  Component Value Date   LDLCALC 71 03/02/2021   Lab Results  Component Value Date   TRIG 50 03/02/2021   Lab Results  Component Value Date   CHOLHDL 2.7 01/03/2020   Lab Results  Component Value Date   HGBA1C 5.5 01/03/2020       Assessment & Plan:   Problem List Items Addressed This Visit       Cardiovascular and Mediastinum   Hypertension    BP Readings from Last 3 Encounters:  09/05/21 (!) 143/80  07/24/21 132/64  07/03/21 140/90  -managed by cardiology -generally well-controlled      CHF  exacerbation ([place])    -doing well -followed by cardiology -no ankle edema today      Persistent atrial fibrillation ([place])    -regular rhythm on exam today        Other   Hyperlipidemia - Primary   Relevant Orders   CBC with Differential/Platelet   CMP14+EGFR   Lipid Panel With LDL/HDL Ratio   Vitamin D deficiency    -check vit D level      Relevant Orders   VITAMIN D 25 Hydroxy (Vit-D Deficiency, Fractures)   Hypomagnesemia    -takes mag supplement -checking mag level today      Other Visit Diagnoses     Magnesium deficiency       Relevant Orders   Magnesium   Need for immunization against influenza       Relevant Orders   Flu Vaccine QUAD High Dose(Fluad) (Completed)        No orders of the defined types were placed in this encounter.    Noreene Larsson, NP

## 2021-09-05 NOTE — Patient Instructions (Signed)
Please have fasting labs drawn tomorrow or Friday AM.

## 2021-09-06 ENCOUNTER — Other Ambulatory Visit: Payer: Self-pay | Admitting: Physician Assistant

## 2021-09-06 DIAGNOSIS — E785 Hyperlipidemia, unspecified: Secondary | ICD-10-CM | POA: Diagnosis not present

## 2021-09-06 DIAGNOSIS — E559 Vitamin D deficiency, unspecified: Secondary | ICD-10-CM | POA: Diagnosis not present

## 2021-09-06 DIAGNOSIS — E612 Magnesium deficiency: Secondary | ICD-10-CM | POA: Diagnosis not present

## 2021-09-06 NOTE — Telephone Encounter (Signed)
This is a [facility] pt °

## 2021-09-07 ENCOUNTER — Other Ambulatory Visit: Payer: Self-pay | Admitting: Nurse Practitioner

## 2021-09-07 LAB — VITAMIN D 25 HYDROXY (VIT D DEFICIENCY, FRACTURES): Vit D, 25-Hydroxy: 25.1 ng/mL — ABNORMAL LOW (ref 30.0–100.0)

## 2021-09-07 LAB — CMP14+EGFR
ALT: 10 IU/L (ref 0–32)
AST: 13 IU/L (ref 0–40)
Albumin/Globulin Ratio: 1.1 — ABNORMAL LOW (ref 1.2–2.2)
Albumin: 3.8 g/dL (ref 3.8–4.8)
Alkaline Phosphatase: 92 IU/L (ref 44–121)
BUN/Creatinine Ratio: 15 (ref 12–28)
BUN: 15 mg/dL (ref 8–27)
Bilirubin Total: 0.4 mg/dL (ref 0.0–1.2)
CO2: 24 mmol/L (ref 20–29)
Calcium: 8.9 mg/dL (ref 8.7–10.3)
Chloride: 103 mmol/L (ref 96–106)
Creatinine, Ser: 1 mg/dL (ref 0.57–1.00)
Globulin, Total: 3.4 g/dL (ref 1.5–4.5)
Glucose: 98 mg/dL (ref 70–99)
Potassium: 4.6 mmol/L (ref 3.5–5.2)
Sodium: 139 mmol/L (ref 134–144)
Total Protein: 7.2 g/dL (ref 6.0–8.5)
eGFR: 61 mL/min/{1.73_m2} (ref >59)

## 2021-09-07 LAB — CBC WITH DIFFERENTIAL/PLATELET
Basophils Absolute: 0 10*3/uL (ref 0.0–0.2)
Basos: 0 %
EOS (ABSOLUTE): 0.2 10*3/uL (ref 0.0–0.4)
Eos: 2 %
Hematocrit: 33.3 % — ABNORMAL LOW (ref 34.0–46.6)
Hemoglobin: 11.1 g/dL (ref 11.1–15.9)
Immature Grans (Abs): 0 10*3/uL (ref 0.0–0.1)
Immature Granulocytes: 0 %
Lymphocytes Absolute: 1.6 10*3/uL (ref 0.7–3.1)
Lymphs: 24 %
MCH: 31.2 pg (ref 26.6–33.0)
MCHC: 33.3 g/dL (ref 31.5–35.7)
MCV: 94 fL (ref 79–97)
Monocytes Absolute: 0.5 10*3/uL (ref 0.1–0.9)
Monocytes: 8 %
Neutrophils Absolute: 4.5 10*3/uL (ref 1.4–7.0)
Neutrophils: 66 %
Platelets: 244 10*3/uL (ref 150–450)
RBC: 3.56 x10E6/uL — ABNORMAL LOW (ref 3.77–5.28)
RDW: 14 % (ref 11.7–15.4)
WBC: 6.9 10*3/uL (ref 3.4–10.8)

## 2021-09-07 LAB — MAGNESIUM: Magnesium: 2 mg/dL (ref 1.6–2.3)

## 2021-09-07 LAB — LIPID PANEL WITH LDL/HDL RATIO
Cholesterol, Total: 111 mg/dL (ref 100–199)
HDL: 47 mg/dL (ref >39)
LDL Chol Calc (NIH): 53 mg/dL (ref 0–99)
LDL/HDL Ratio: 1.1 ratio (ref 0.0–3.2)
Triglycerides: 45 mg/dL (ref 0–149)
VLDL Cholesterol Cal: 11 mg/dL (ref 5–40)

## 2021-09-07 MED ORDER — VITAMIN D (ERGOCALCIFEROL) 1.25 MG (50000 UNIT) PO CAPS: 50000.0000 [IU] | ORAL_CAPSULE | ORAL | 3 refills | Status: DC

## 2021-09-07 NOTE — Progress Notes (Signed)
Labs are stable. Vit D was a little low, so I called in a prescription for you. You can stop the 2000 IU daily, and I sent in a year of the prescription-grade dose.

## 2021-09-10 ENCOUNTER — Other Ambulatory Visit: Payer: Self-pay

## 2021-09-10 MED ORDER — GABAPENTIN 300 MG PO CAPS: 300.0000 mg | ORAL_CAPSULE | Freq: Two times a day (BID) | ORAL | 2 refills | Status: DC

## 2021-09-10 MED ORDER — METOPROLOL TARTRATE 25 MG PO TABS: 25.0000 mg | ORAL_TABLET | Freq: Two times a day (BID) | ORAL | 1 refills | Status: DC

## 2021-09-12 ENCOUNTER — Other Ambulatory Visit (HOSPITAL_COMMUNITY): Payer: Self-pay

## 2021-09-12 ENCOUNTER — Other Ambulatory Visit: Payer: Self-pay | Admitting: Physician Assistant

## 2021-09-12 MED ORDER — APIXABAN 5 MG PO TABS: 5.0000 mg | ORAL_TABLET | Freq: Two times a day (BID) | ORAL | 11 refills | Status: DC

## 2021-10-01 DIAGNOSIS — G4733 Obstructive sleep apnea (adult) (pediatric): Secondary | ICD-10-CM | POA: Diagnosis not present

## 2021-10-25 ENCOUNTER — Other Ambulatory Visit: Payer: Self-pay

## 2021-10-25 ENCOUNTER — Emergency Department (HOSPITAL_COMMUNITY)
Admission: EM | Admit: 2021-10-25 | Discharge: 2021-10-25 | Disposition: A | Discharge status: Home / Self Care | Payer: Medicare Other | Attending: Emergency Medicine | Admitting: Emergency Medicine

## 2021-10-25 ENCOUNTER — Encounter (HOSPITAL_COMMUNITY): Payer: Self-pay

## 2021-10-25 DIAGNOSIS — R059 Cough, unspecified: Secondary | ICD-10-CM | POA: Diagnosis present

## 2021-10-25 DIAGNOSIS — Z20822 Contact with and (suspected) exposure to covid-19: Secondary | ICD-10-CM | POA: Diagnosis not present

## 2021-10-25 DIAGNOSIS — R110 Nausea: Secondary | ICD-10-CM

## 2021-10-25 DIAGNOSIS — J101 Influenza due to other identified influenza virus with other respiratory manifestations: Secondary | ICD-10-CM | POA: Diagnosis not present

## 2021-10-25 DIAGNOSIS — I2510 Atherosclerotic heart disease of native coronary artery without angina pectoris: Secondary | ICD-10-CM | POA: Diagnosis not present

## 2021-10-25 DIAGNOSIS — R0789 Other chest pain: Secondary | ICD-10-CM | POA: Diagnosis not present

## 2021-10-25 DIAGNOSIS — Z87891 Personal history of nicotine dependence: Secondary | ICD-10-CM | POA: Diagnosis not present

## 2021-10-25 DIAGNOSIS — I10 Essential (primary) hypertension: Secondary | ICD-10-CM | POA: Insufficient documentation

## 2021-10-25 DIAGNOSIS — E039 Hypothyroidism, unspecified: Secondary | ICD-10-CM | POA: Diagnosis not present

## 2021-10-25 LAB — CBC
HCT: 31.6 % — ABNORMAL LOW (ref 36.0–46.0)
Hemoglobin: 10.3 g/dL — ABNORMAL LOW (ref 12.0–15.0)
MCH: 30.7 pg (ref 26.0–34.0)
MCHC: 32.6 g/dL (ref 30.0–36.0)
MCV: 94 fL (ref 80.0–100.0)
Platelets: 230 10*3/uL (ref 150–400)
RBC: 3.36 MIL/uL — ABNORMAL LOW (ref 3.87–5.11)
RDW: 15.2 % (ref 11.5–15.5)
WBC: 9.4 10*3/uL (ref 4.0–10.5)
nRBC: 0 % (ref 0.0–0.2)

## 2021-10-25 LAB — BASIC METABOLIC PANEL
Anion gap: 6 (ref 5–15)
BUN: 14 mg/dL (ref 8–23)
CO2: 25 mmol/L (ref 22–32)
Calcium: 8.3 mg/dL — ABNORMAL LOW (ref 8.9–10.3)
Chloride: 101 mmol/L (ref 98–111)
Creatinine, Ser: 1 mg/dL (ref 0.44–1.00)
GFR, Estimated: >60 mL/min (ref >60)
Glucose, Bld: 112 mg/dL — ABNORMAL HIGH (ref 70–99)
Potassium: 4.1 mmol/L (ref 3.5–5.1)
Sodium: 132 mmol/L — ABNORMAL LOW (ref 135–145)

## 2021-10-25 LAB — TROPONIN I (HIGH SENSITIVITY): Troponin I (High Sensitivity): 9 ng/L (ref <18)

## 2021-10-25 LAB — RESP PANEL BY RT-PCR (FLU A&B, COVID) ARPGX2
Influenza A by PCR: POSITIVE — AB
Influenza B by PCR: NEGATIVE
SARS Coronavirus 2 by RT PCR: NEGATIVE

## 2021-10-25 MED ORDER — OSELTAMIVIR PHOSPHATE 75 MG PO CAPS: 75.0000 mg | ORAL_CAPSULE | Freq: Two times a day (BID) | ORAL | 0 refills | Status: DC

## 2021-10-25 MED ORDER — ONDANSETRON 4 MG PO TBDP
4.0000 mg | ORAL_TABLET | Freq: Once | ORAL | Status: AC
  Administered 2021-10-25: 4 mg via ORAL
  Filled 2021-10-25: qty 1

## 2021-10-25 MED ORDER — ONDANSETRON 4 MG PO TBDP: 4.0000 mg | ORAL_TABLET | Freq: Three times a day (TID) | ORAL | 0 refills | Status: DC | PRN

## 2021-10-25 NOTE — ED Triage Notes (Signed)
Pt arrived via POV w c/o congestion, sore throat, chills, diarrhea, and nausea that started yesterday in the am.

## 2021-10-25 NOTE — Discharge Instructions (Addendum)
You were evaluated in the Emergency Department and after careful evaluation, we did not find any emergent condition requiring admission or further testing in the hospital.  Your exam/testing today was overall reassuring.  You have tested positive for influenza A.  Take the Tamiflu as directed to help get better faster.  Use the Zofran as needed for nausea.  Recommend Tylenol for discomfort.  Please return to the Emergency Department if you experience any worsening of your condition.  Thank you for allowing [place] to be a part of your care.

## 2021-10-25 NOTE — ED Provider Notes (Signed)
[facility] Emergency Department Provider Note MRN:  [id number]  Arrival date & time: 10/25/21     Chief Complaint   Chest Pain (Flu like sx)   History of Present Illness   Maria Fry is a 70 y.o. year-old female with a history of CAD presenting to the ED with chief complaint of flulike symptoms.  This evening experiencing nausea, cough, congestion, sore throat, chills.  Endorsing some chest tightness.  No nausea or vomiting.  No shortness of breath, no abdominal pain.  Symptoms are moderate, constant, no exacerbating or alleviating factors.  Review of Systems  A complete 10 system review of systems was obtained and all systems are negative except as noted in the HPI and PMH.   Patient's Health History    Past Medical History:  Diagnosis Date   Arthritis    Right knee   Atrial fibrillation (HCC)    CAD (coronary artery disease)    a. 06/07/15 NSTEMI/Cath: Dist LAD 100%, mid LAD 10%. Otw nl cors. Nl EF w/ inferoapical AK.   Essential hypertension    Hip pain    History of cardiomyopathy 04/23/2016   a. 04/2016 Echo: EF 45%, Gr2 DD; b. 11/2019 Echo: EF 45-50%, mod LVH. Gr2 DD. Nl RV fxn. Sev dil LA.    Hyperlipidemia    Hypothyroidism    NSTEMI (non-ST elevated myocardial infarction) ([place]) 06/05/2015   Vitamin D deficiency     Past Surgical History:  Procedure Laterality Date   ABDOMINAL HYSTERECTOMY     partial- one ovary remaining   BIOPSY  09/17/2020   Procedure: BIOPSY;  Surgeon: [name], DO;  [facility];  Service: Endoscopy;;   CARDIAC CATHETERIZATION N/A 06/06/2015   Procedure: Left Heart Cath and Coronary Angiography;  Surgeon: Peter M Martinique, MD;  Location: [place] [facility];  Service: Cardiovascular;  Laterality: N/A;   CARDIOVERSION N/A 09/14/2020   Procedure: CARDIOVERSION;  Surgeon: Satira Sark, MD;  [facility];  Service: Cardiovascular;  Laterality: N/A;   CHOLECYSTECTOMY     COLONOSCOPY      COLONOSCOPY N/A 08/13/2017   Procedure: COLONOSCOPY;  Surgeon: Daneil Dolin, MD;  [facility];  Service: Endoscopy;  Laterality: N/A;  8:30 AM   COLONOSCOPY WITH PROPOFOL N/A 01/18/2021   [name]: Multiple tubular adenomas removed, next colonoscopy in 3 years   ESOPHAGOGASTRODUODENOSCOPY N/A 09/11/2016   Procedure: ESOPHAGOGASTRODUODENOSCOPY (EGD);  Surgeon: Daneil Dolin, MD;  [facility];  Service: Endoscopy;  Laterality: N/A;  7:45 am - moved to 10/11 @ 10:30   ESOPHAGOGASTRODUODENOSCOPY (EGD) WITH PROPOFOL N/A 09/17/2020   [name]: Diffuse moderate inflammation characterized by erosions and erythema found in the entire stomach.  Biopsies positive for H. pylori.  Patient has not been treated due to drug allergies.   Heel spurs Left    MALONEY DILATION N/A 09/11/2016   Procedure: Venia Minks DILATION;  Surgeon: Daneil Dolin, MD;  [facility];  Service: Endoscopy;  Laterality: N/A;   POLYPECTOMY  08/13/2017   Procedure: POLYPECTOMY;  Surgeon: Daneil Dolin, MD;  [facility];  Service: Endoscopy;;  colon   POLYPECTOMY  01/18/2021   Procedure: POLYPECTOMY;  Surgeon: Daneil Dolin, MD;  [facility];  Service: Endoscopy;;   SHOULDER ARTHROSCOPY Right    TUBAL LIGATION      Family History  Problem Relation Age of Onset   Heart failure Father  Deceased   Heart attack Father        Deceased   Stroke Sister        Deceased   Heart failure Brother        Deceased   Cancer Brother        Deceased   Heart attack Brother        Deceased   Colon cancer Neg Hx     Social History   Socioeconomic History   Marital status: Single    Spouse name: Not on file   Number of children: Not on file   Years of education: Not on file   Highest education level: Not on file  Occupational History   Not on file  Tobacco Use   Smoking status: Former    Packs/day: 1.00    Years: 20.00    Pack years: 20.00    Types: Cigarettes    Start  date: 12/02/1977    Quit date: 12/02/2002    Years since quitting: 18.9   Smokeless tobacco: Never  Vaping Use   Vaping Use: Never used  Substance and Sexual Activity   Alcohol use: No    Alcohol/week: 0.0 standard drinks   Drug use: No   Sexual activity: Not on file  Other Topics Concern   Not on file  Social History Narrative   Retired from [employer].   Social Determinants of Health   Financial Resource Strain: Not on file  Food Insecurity: Not on file  Transportation Needs: Not on file  Physical Activity: Not on file  Stress: Not on file  Social Connections: Not on file  Intimate Partner Violence: Not on file     Physical Exam   Vitals:   10/25/21 0400 10/25/21 0430  BP: 137/76 115/66  Pulse: (!) 59 61  Resp: 17 18  Temp:    SpO2: 97% 92%    CONSTITUTIONAL: Well-appearing, NAD NEURO:  Alert and oriented x 3, no focal deficits EYES:  eyes equal and reactive ENT/NECK:  no LAD, no JVD CARDIO: Regular rate, well-perfused, normal S1 and S2 PULM:  CTAB no wheezing or rhonchi GI/GU:  normal bowel sounds, non-distended, non-tender MSK/SPINE:  No gross deformities, no edema SKIN:  no rash, atraumatic PSYCH:  Appropriate speech and behavior  *Additional and/or pertinent findings included in MDM below  Diagnostic and Interventional Summary    EKG Interpretation  Date/Time:  Thursday October 25 2021 04:14:43 EST Ventricular Rate:  59 PR Interval:  193 QRS Duration: 116 QT Interval:  462 QTC Calculation: 458 R Axis:   22 Text Interpretation: Sinus rhythm Nonspecific intraventricular conduction delay Borderline repolarization abnormality Confirmed by Gerlene Fee [phone]) on 10/25/2021 4:17:17 AM       Labs Reviewed  RESP PANEL BY RT-PCR (FLU A&B, COVID) ARPGX2 - Abnormal; Notable for the following components:      Result Value   Influenza A by PCR POSITIVE (*)    All other components within normal limits  CBC - Abnormal; Notable for the following components:    RBC 3.36 (*)    Hemoglobin 10.3 (*)    HCT 31.6 (*)    All other components within normal limits  BASIC METABOLIC PANEL - Abnormal; Notable for the following components:   Sodium 132 (*)    Glucose, Bld 112 (*)    Calcium 8.3 (*)    All other components within normal limits  TROPONIN I (HIGH SENSITIVITY)    No orders to display    Medications  ondansetron (  ZOFRAN-ODT) disintegrating tablet 4 mg (4 mg Oral Given 10/25/21 0351)     Procedures  /  Critical Care Procedures  ED Course and Medical Decision Making  I have reviewed the triage vital signs, the nursing notes, and pertinent available records from the EMR.  Listed above are laboratory and imaging tests that I personally ordered, reviewed, and interpreted and then considered in my medical decision making (see below for details).  Suspect viral illness.  Patient having some chest tightness with nausea, screening EKG showing some T wave inversions compared to prior.  Will obtain troponin, labs.  Overall doubt cardiac etiology.     Labs are normal, patient is appropriate for discharge, positive for influenza A.  Barth Kirks. Sedonia Small, MD [place] [email or web address]  Final Clinical Impressions(s) / ED Diagnoses     ICD-10-CM   1. Nausea  R11.0     2. Influenza A  J10.1       ED Discharge Orders          Ordered    ondansetron (ZOFRAN-ODT) 4 MG disintegrating tablet  Every 8 hours PRN        10/25/21 0539    oseltamivir (TAMIFLU) 75 MG capsule  Every 12 hours        10/25/21 0539             Discharge Instructions Discussed with and Provided to Patient:     Discharge Instructions      You were evaluated in the Emergency Department and after careful evaluation, we did not find any emergent condition requiring admission or further testing in the hospital.  Your exam/testing today was overall reassuring.  You have tested positive for influenza A.  Take the  Tamiflu as directed to help get better faster.  Use the Zofran as needed for nausea.  Recommend Tylenol for discomfort.  Please return to the Emergency Department if you experience any worsening of your condition.  Thank you for allowing [place] to be a part of your care.         Maudie Flakes, MD 10/25/21 [phone]

## 2021-10-31 DIAGNOSIS — G4733 Obstructive sleep apnea (adult) (pediatric): Secondary | ICD-10-CM | POA: Diagnosis not present

## 2021-11-06 ENCOUNTER — Other Ambulatory Visit: Payer: Self-pay | Admitting: Nurse Practitioner

## 2021-11-07 ENCOUNTER — Other Ambulatory Visit: Payer: Self-pay | Admitting: Physician Assistant

## 2021-11-09 ENCOUNTER — Other Ambulatory Visit: Payer: Self-pay

## 2021-11-09 ENCOUNTER — Encounter (HOSPITAL_COMMUNITY): Payer: Self-pay | Admitting: Physician Assistant

## 2021-11-09 ENCOUNTER — Ambulatory Visit (HOSPITAL_COMMUNITY)
Admission: RE | Admit: 2021-11-09 | Discharge: 2021-11-09 | Disposition: A | Discharge status: Home / Self Care | Payer: Medicare Other | Source: Ambulatory Visit | Attending: Physician Assistant | Admitting: Physician Assistant

## 2021-11-09 VITALS — BP 138/88 | HR 59 | Ht 60.0 in | Wt 224.4 lb

## 2021-11-09 DIAGNOSIS — D6869 Other thrombophilia: Secondary | ICD-10-CM | POA: Diagnosis not present

## 2021-11-09 DIAGNOSIS — E785 Hyperlipidemia, unspecified: Secondary | ICD-10-CM | POA: Diagnosis not present

## 2021-11-09 DIAGNOSIS — G4733 Obstructive sleep apnea (adult) (pediatric): Secondary | ICD-10-CM | POA: Insufficient documentation

## 2021-11-09 DIAGNOSIS — I5042 Chronic combined systolic (congestive) and diastolic (congestive) heart failure: Secondary | ICD-10-CM | POA: Insufficient documentation

## 2021-11-09 DIAGNOSIS — E039 Hypothyroidism, unspecified: Secondary | ICD-10-CM | POA: Diagnosis not present

## 2021-11-09 DIAGNOSIS — I110 Hypertensive heart disease with heart failure: Secondary | ICD-10-CM | POA: Insufficient documentation

## 2021-11-09 DIAGNOSIS — E669 Obesity, unspecified: Secondary | ICD-10-CM | POA: Insufficient documentation

## 2021-11-09 DIAGNOSIS — Z79899 Other long term (current) drug therapy: Secondary | ICD-10-CM | POA: Diagnosis not present

## 2021-11-09 DIAGNOSIS — I4819 Other persistent atrial fibrillation: Secondary | ICD-10-CM | POA: Diagnosis not present

## 2021-11-09 DIAGNOSIS — Z6841 Body Mass Index (BMI) 40.0 and over, adult: Secondary | ICD-10-CM | POA: Diagnosis not present

## 2021-11-09 DIAGNOSIS — I2510 Atherosclerotic heart disease of native coronary artery without angina pectoris: Secondary | ICD-10-CM | POA: Diagnosis not present

## 2021-11-09 DIAGNOSIS — Z7901 Long term (current) use of anticoagulants: Secondary | ICD-10-CM | POA: Insufficient documentation

## 2021-11-09 LAB — BASIC METABOLIC PANEL
Anion gap: 6 (ref 5–15)
BUN: 16 mg/dL (ref 8–23)
CO2: 25 mmol/L (ref 22–32)
Calcium: 8.3 mg/dL — ABNORMAL LOW (ref 8.9–10.3)
Chloride: 103 mmol/L (ref 98–111)
Creatinine, Ser: 1.08 mg/dL — ABNORMAL HIGH (ref 0.44–1.00)
GFR, Estimated: 56 mL/min — ABNORMAL LOW (ref >60)
Glucose, Bld: 91 mg/dL (ref 70–99)
Potassium: 4 mmol/L (ref 3.5–5.1)
Sodium: 134 mmol/L — ABNORMAL LOW (ref 135–145)

## 2021-11-09 LAB — MAGNESIUM: Magnesium: 2.3 mg/dL (ref 1.7–2.4)

## 2021-11-09 NOTE — Progress Notes (Signed)
Primary Care Physician: Noreene Larsson, NP Primary Cardiologist: Dr Domenic Polite  Primary Electrophysiologist: Dr Rayann Heman Referring Physician: Levell July   Maria Fry is a 70 y.o. female with a history of CAD, chronic combined systolic and diastolic CHF, HLD, HTN, OSA, hypothyroidism, and persistent atrial fibrillation who presents for follow up in the [facility]. The patient was initially diagnosed with atrial fibrillation 08/18/20 at a routine follow up with Rosaria Ferries. She was having symptoms of increased fatigue. She was started on Eliquis for a CHADS2VASC score of 5. She underwent DCCV on 09/14/20. She was hospitalized 10/15-10/17/21 for acute CHF and black stools. Workup revealed gastritis and her Eliquis was temporarily held. She resumed 09/17/20. Her ASA was discontinued and she has not had recurrence of bleeding. She was back in afib on follow up 10/02/20. She reports that she did feel better in SR with more energy and less SOB.  Pt is s/p dofetilide loading 11/9-11/14/21. She converted to sinus rhythm after the first dose however, she was noted to have significant widening of QTC out to 551 ms. Dofetilide was held for 24 hours and subsequently resumed at 125 mcg twice daily after QT normalized. She has been diagnosed with OSA and has started using her CPAP machine.   On follow up today, patient reports that she has done well from a cardiac standpoint. She did have the flu but has since recovered. No heart racing or palpitations. No bleeding issues on anticoagulation.   Today, she denies symptoms of palpitations, SOB, chest pain, orthopnea, PND, dizziness, presyncope, syncope, bleeding, or neurologic sequela. The patient is tolerating medications without difficulties and is otherwise without complaint today.    Atrial Fibrillation Risk Factors:  she does have symptoms or diagnosis of sleep apnea. Patient reports compliance with CPAP. she does not have a  history of rheumatic fever.   she has a BMI of Body mass index is 43.83 kg/m.Marland Kitchen Filed Weights   11/09/21 0954  Weight: 101.8 kg      Family History  Problem Relation Age of Onset   Heart failure Father        Deceased   Heart attack Father        Deceased   Stroke Sister        Deceased   Heart failure Brother        Deceased   Cancer Brother        Deceased   Heart attack Brother        Deceased   Colon cancer Neg Hx      Atrial Fibrillation Management history:  Previous antiarrhythmic drugs: dofetilide  Previous cardioversions: 09/14/20 Previous ablations: none CHADS2VASC score: 5 Anticoagulation history: Eliquis   Past Medical History:  Diagnosis Date   Arthritis    Right knee   Atrial fibrillation ([place])    CAD (coronary artery disease)    a. 06/07/15 NSTEMI/Cath: Dist LAD 100%, mid LAD 10%. Otw nl cors. Nl EF w/ inferoapical AK.   Essential hypertension    Hip pain    History of cardiomyopathy 04/23/2016   a. 04/2016 Echo: EF 45%, Gr2 DD; b. 11/2019 Echo: EF 45-50%, mod LVH. Gr2 DD. Nl RV fxn. Sev dil LA.    Hyperlipidemia    Hypothyroidism    NSTEMI (non-ST elevated myocardial infarction) ([place]) 06/05/2015   Vitamin D deficiency    Past Surgical History:  Procedure Laterality Date   ABDOMINAL HYSTERECTOMY     partial- one ovary remaining  BIOPSY  09/17/2020   Procedure: BIOPSY;  Surgeon: [name], DO;  [facility];  Service: Endoscopy;;   CARDIAC CATHETERIZATION N/A 06/06/2015   Procedure: Left Heart Cath and Coronary Angiography;  Surgeon: Peter M Martinique, MD;  Location: [place] [facility];  Service: Cardiovascular;  Laterality: N/A;   CARDIOVERSION N/A 09/14/2020   Procedure: CARDIOVERSION;  Surgeon: Satira Sark, MD;  [facility];  Service: Cardiovascular;  Laterality: N/A;   CHOLECYSTECTOMY     COLONOSCOPY     COLONOSCOPY N/A 08/13/2017   Procedure: COLONOSCOPY;  Surgeon: Daneil Dolin, MD;  [facility];  Service: Endoscopy;  Laterality: N/A;  8:30 AM   COLONOSCOPY WITH PROPOFOL N/A 01/18/2021   [name]: Multiple tubular adenomas removed, next colonoscopy in 3 years   ESOPHAGOGASTRODUODENOSCOPY N/A 09/11/2016   Procedure: ESOPHAGOGASTRODUODENOSCOPY (EGD);  Surgeon: Daneil Dolin, MD;  [facility];  Service: Endoscopy;  Laterality: N/A;  7:45 am - moved to 10/11 @ 10:30   ESOPHAGOGASTRODUODENOSCOPY (EGD) WITH PROPOFOL N/A 09/17/2020   [name]: Diffuse moderate inflammation characterized by erosions and erythema found in the entire stomach.  Biopsies positive for H. pylori.  Patient has not been treated due to drug allergies.   Heel spurs Left    MALONEY DILATION N/A 09/11/2016   Procedure: Venia Minks DILATION;  Surgeon: Daneil Dolin, MD;  [facility];  Service: Endoscopy;  Laterality: N/A;   POLYPECTOMY  08/13/2017   Procedure: POLYPECTOMY;  Surgeon: Daneil Dolin, MD;  [facility];  Service: Endoscopy;;  colon   POLYPECTOMY  01/18/2021   Procedure: POLYPECTOMY;  Surgeon: Daneil Dolin, MD;  [facility];  Service: Endoscopy;;   SHOULDER ARTHROSCOPY Right    TUBAL LIGATION      Current Outpatient Medications  Medication Sig Dispense Refill   acetaminophen (TYLENOL) 500 MG tablet Take 500 mg by mouth every 6 (six) hours as needed for mild pain or moderate pain.     apixaban (ELIQUIS) 5 MG TABS tablet Take 1 tablet (5 mg total) by mouth 2 (two) times daily. 60 tablet 11   atorvastatin (LIPITOR) 80 MG tablet Take 1 tablet (80 mg total) by mouth at bedtime. 90 tablet 3   calcium carbonate (OS-CAL - DOSED IN MG OF ELEMENTAL CALCIUM) 1250 (500 Ca) MG tablet Take 500 mg by mouth daily.     diclofenac Sodium (VOLTAREN) 1 % GEL Apply 1 application topically 4 (four) times daily as needed (pain).     dofetilide (TIKOSYN) 125 MCG capsule Take 1 capsule by mouth twice daily 180 capsule 0   ferrous sulfate 325 (65 FE) MG EC tablet Take 325 mg by  mouth daily.     furosemide (LASIX) 40 MG tablet Take 1 tablet (40 mg total) by mouth 2 (two) times daily. 180 tablet 1   gabapentin (NEURONTIN) 300 MG capsule Take 1 capsule (300 mg total) by mouth 2 (two) times daily. 60 capsule 2   levothyroxine (SYNTHROID) 100 MCG tablet Take 1 tablet (100 mcg total) by mouth daily before breakfast. 90 tablet 3   loratadine (CLARITIN) 10 MG tablet Take 10 mg by mouth daily.     magnesium oxide (MAG-OX) 400 (240 Mg) MG tablet Take 1 tablet by mouth once daily 30 tablet 3   magnesium oxide (MAG-OX) 400 MG tablet Take 0.5 tablets (200 mg total) by mouth daily. 30 tablet 6   melatonin 3 MG TABS tablet Take 3  mg by mouth at bedtime.     metoprolol tartrate (LOPRESSOR) 25 MG tablet Take 1 tablet (25 mg total) by mouth 2 (two) times daily. 180 tablet 1   nitroGLYCERIN (NITROSTAT) 0.4 MG SL tablet DISSOLVE ONE TABLET UNDER THE TONGUE EVERY 5 MINUTES AS NEEDED FOR CHEST PAIN.  DO NOT EXCEED A TOTAL OF 3 DOSES IN 15 MINUTES (Patient taking differently: DISSOLVE ONE TABLET UNDER THE TONGUE EVERY 5 MINUTES AS NEEDED FOR CHEST PAIN.  DO NOT EXCEED A TOTAL OF 3 DOSES IN 15 MINUTES) 25 tablet 3   ondansetron (ZOFRAN-ODT) 4 MG disintegrating tablet Take 1 tablet (4 mg total) by mouth every 8 (eight) hours as needed for nausea or vomiting. 20 tablet 0   oseltamivir (TAMIFLU) 75 MG capsule Take 1 capsule (75 mg total) by mouth every 12 (twelve) hours. 10 capsule 0   potassium chloride SA (KLOR-CON) 20 MEQ tablet Take 3 tablets (60 mEq total) by mouth daily. 90 tablet 1   ranolazine (RANEXA) 500 MG 12 hr tablet Take 1 tablet (500 mg total) by mouth 2 (two) times daily. 60 tablet 6   spironolactone (ALDACTONE) 25 MG tablet Take 0.5 tablets (12.5 mg total) by mouth daily. 45 tablet 1   tiZANidine (ZANAFLEX) 2 MG tablet Take 1 tablet (2 mg total) by mouth every 6 (six) hours as needed for muscle spasms. 30 tablet 0   Vitamin D, Ergocalciferol, (DRISDOL) 1.25 MG (50000 UNIT) CAPS  capsule Take 1 capsule (50,000 Units total) by mouth every 7 (seven) days. 13 capsule 3   No current facility-administered medications for this encounter.    Allergies  Allergen Reactions   Afrin [Oxymetazoline] Anaphylaxis   Amoxicillin Anaphylaxis   Lisinopril Anaphylaxis   Ranolazine Anaphylaxis    Dizzy, Reaction occurred with generic med   Vicodin [Hydrocodone-Acetaminophen] Diarrhea and Nausea And Vomiting   Demerol [Meperidine] Nausea And Vomiting   Lodine [Etodolac] Rash    Social History   Socioeconomic History   Marital status: Single    Spouse name: Not on file   Number of children: Not on file   Years of education: Not on file   Highest education level: Not on file  Occupational History   Not on file  Tobacco Use   Smoking status: Former    Packs/day: 1.00    Years: 20.00    Pack years: 20.00    Types: Cigarettes    Start date: 12/02/1977    Quit date: 12/02/2002    Years since quitting: 18.9   Smokeless tobacco: Never  Vaping Use   Vaping Use: Never used  Substance and Sexual Activity   Alcohol use: No    Alcohol/week: 0.0 standard drinks   Drug use: No   Sexual activity: Not on file  Other Topics Concern   Not on file  Social History Narrative   Retired from [employer].   Social Determinants of Health   Financial Resource Strain: Not on file  Food Insecurity: Not on file  Transportation Needs: Not on file  Physical Activity: Not on file  Stress: Not on file  Social Connections: Not on file  Intimate Partner Violence: Not on file     ROS- All systems are reviewed and negative except as per the HPI above.  Physical Exam: Vitals:   11/09/21 0954  BP: 138/88  Pulse: (!) 59  Weight: 101.8 kg  Height: 5' (1.524 m)    GEN- The patient is a well appearing obese female, alert and oriented x 3  today.   HEENT-head normocephalic, atraumatic, sclera clear, conjunctiva pink, hearing intact, trachea midline. Lungs- Clear to ausculation bilaterally,  normal work of breathing Heart- Regular rate and rhythm, no murmurs, rubs or gallops  GI- soft, NT, ND, + BS Extremities- no clubbing, cyanosis, or edema MS- no significant deformity or atrophy Skin- no rash or lesion Psych- euthymic mood, full affect Neuro- strength and sensation are intact   Wt Readings from Last 3 Encounters:  11/09/21 101.8 kg  10/25/21 102.1 kg  09/05/21 101.6 kg    EKG today demonstrates  SB Vent. rate 59 BPM PR interval 180 ms QRS duration 104 ms QT/QTcB 474/469 ms  Echo 02/08/21 demonstrated   1. Left ventricular ejection fraction, by estimation, is 50 to 55%. The  left ventricle has low normal function. The left ventricle has no regional  wall motion abnormalities. The left ventricular internal cavity size was  mildly dilated. There is mild left ventricular hypertrophy. Left ventricular diastolic parameters are indeterminate.   2. Right ventricular systolic function is normal. The right ventricular  size is normal.   3. Left atrial size was severely dilated.   4. Right atrial size was moderately dilated.   5. The mitral valve is abnormal. Mild to moderate mitral valve  regurgitation. No evidence of mitral stenosis.   6. The aortic valve is tricuspid. There is mild calcification of the  aortic valve. There is mild thickening of the aortic valve. Aortic valve  regurgitation is mild. No aortic stenosis is present.   7. Moderate pulmonary hypertension, PASP is 52 mmHg.   8. The inferior vena cava is normal in size with greater than 50%  respiratory variability, suggesting right atrial pressure of 3 mmHg.   Comparison(s): Previous Echo showed LV EF 40-45%, global hypokinesis, severe LVH, severe bi-atrial enlargement, moderate TR, mild to moderate MR, PASP 50 mmHg.   Epic records are reviewed at length today  CHA2DS2-VASc Score = 5  The patient's score is based upon: CHF History: 1 HTN History: 1 Diabetes History: 0 Stroke History: 0 Vascular  Disease History: 1 Age Score: 1 Gender Score: 1       ASSESSMENT AND PLAN: 1. Persistent Atrial Fibrillation (ICD10:  I48.19) The patient's CHA2DS2-VASc score is 5, indicating a 7.2% annual risk of stroke.   S/p dofetilide loading 11/9-11/14/21 Patient appears to be maintaining SR. Continue dofetilide 125 mcg BID. QT stable. Check bmet/mag today. Continue Eliquis 5 mg BID Continue Lopressor 25 mg BID  2. Secondary Hypercoagulable State (ICD10:  D68.69) The patient is at significant risk for stroke/thromboembolism based upon her CHA2DS2-VASc Score of 5.  Continue Apixaban (Eliquis).   3. Obesity Body mass index is 43.83 kg/m. Lifestyle modification was discussed and encouraged including regular physical activity and weight reduction.  4. OSA Patient reports compliance with CPAP therapy.  5. Chronic combined diastolic CHF No signs or symptoms of fluid overload today.  6. CAD Should not have up titration of Ranexa 2/2 QT. No anginal symptoms.  7. HTN Stable, no changes today.   Follow up with Dr Domenic Polite as scheduled. AF clinic in 6 months.    [facility] [address] [place], [place] [address] [phone]

## 2021-11-28 ENCOUNTER — Ambulatory Visit (INDEPENDENT_AMBULATORY_CARE_PROVIDER_SITE_OTHER): Payer: Medicare Other

## 2021-11-28 ENCOUNTER — Other Ambulatory Visit: Payer: Self-pay

## 2021-11-28 DIAGNOSIS — Z0000 Encounter for general adult medical examination without abnormal findings: Secondary | ICD-10-CM

## 2021-11-28 DIAGNOSIS — Z1231 Encounter for screening mammogram for malignant neoplasm of breast: Secondary | ICD-10-CM | POA: Diagnosis not present

## 2021-11-28 NOTE — Patient Instructions (Addendum)
Maria Fry , Thank you for taking time to come for your Medicare Wellness Visit. I appreciate your ongoing commitment to your health goals. Please review the following plan we discussed and let me know if I can assist you in the future.   These are the goals we discussed:  Goals      Pharmacy Care Plan:     CARE PLAN ENTRY (see longitudinal plan of care for additional care plan information)  Current Barriers:  Chronic Disease Management support, education, and care coordination needs related to Hypertension, Hyperlipidemia, Heart Failure, Coronary Artery Disease, and anemia   Hypertension BP Readings from Last 3 Encounters:  12/22/20 122/84  11/08/20 134/78  10/31/20 132/78  Pharmacist Clinical Goal(s): Over the next 120 days, patient will work with PharmD and providers to achieve BP goal <130/80 Current regimen:  Metoprolol 77m twice daily Interventions: Reviewed home monitoring procedures Comprehensive medication review Patient self care activities - Over the next 180 days, patient will: Check BP periodically, document, and provide at future appointments Ensure daily salt intake < 2300 mg/day  Hyperlipidemia Lab Results  Component Value Date/Time   [facility] 01/03/2020 09:48 AM  Pharmacist Clinical Goal(s): Over the next 180 days, patient will work with PharmD and providers to maintain LDL goal < 70 Current regimen:  Atorvastatin 885mdaily Interventions: Reviewed most recent lipid panel Counseled on importance of compliance with statin medications. Patient self care activities - Over the next 180 days, patient will: Continue to take medication as prescribed Focus on adherence by pill box  Heart Failure Pharmacist Clinical Goal(s) Over the next 180 days, patient will work with PharmD and providers to optimize medication and minimize symptoms related to HF. Current regimen:  Metoprolol tartrate 2534mid Furosemide 44m63mily four times per week, and twice daily  3 times per week Interventions: Counseled on importance of monitoring weight daily Patient self care activities - Over the next 180 days, patient will: Begin to monitor weight daily as directed  Contact providers if you gain more than 3 pounds in one day or 5 pounds in a week.   CAD Pharmacist Clinical Goal(s) Over the next 180 days, patient will work with PharmD and providers to optimize medication and minimize symptoms related to CAD. Current regimen:  ASA 81mg46mnxa 1000mg 75me daily Interventions: Discussed cost/affordability of Ranexa. Researched other programs patient may be eligible for copay assistance. Patient self care activities - Over the next 180 days, patient will: Continue to take medication as she has been, wait for PharmD to reach out regarding programs for copay assistance.  Anemia Pharmacist Clinical Goal(s) Over the next 120 days, patient will work with PharmD and providers to optimize medication and minimize symptoms related to anemia. Current regimen:  Ferrous Sulfate 325mg d98m Interventions: Counseled on importance of taking medication daily Discussed switching to night time dosing if makes patient tired. Reminded to d/c iron one week prior to colonoscopy Patient self care activities - Over the next 120 days, patient will: Begin to take medication in the evening to see if this helps with sleepiness  Afib Pharmacist Clinical Goal(s) Over the next 120 days, patient will work with PharmD and providers to optimize medication and minimize symptoms related to Afib Current regimen:  Eliquis 5mg twi68mdaily Tikosyn 125 mcg  Interventions: Evaluated for abnormal bleed Explained Eliquis PAP program to patient  Patient self care activities - Over the next 120 days, patient will: Notify PharmD when [phone] of pocket has been  met Remember to d/c Eliquis before colonoscopy as directed  Please see past updates related to this goal by clicking on the  "Past Updates" button in the selected goal          This is a list of the screening recommended for you and due dates:  Health Maintenance  Topic Date Due   Zoster (Shingles) Vaccine (1 of 2) Never done   Tetanus Vaccine  12/02/2020   Mammogram  02/02/2021   COVID-19 Vaccine (5 - Booster for Pfizer series) 08/24/2021   Colon Cancer Screening  01/18/2031   Pneumonia Vaccine  Completed   Flu Shot  Completed   DEXA scan (bone density measurement)  Completed   Hepatitis C Screening: USPSTF Recommendation to screen - Ages 68-68 yo.  Completed   HPV Vaccine  Aged Out    Health Maintenance, Female Adopting a healthy lifestyle and getting preventive care are important in promoting health and wellness. Ask your health care provider about: The right schedule for you to have regular tests and exams. Things you can do on your own to prevent diseases and keep yourself healthy. What should I know about diet, weight, and exercise? Eat a healthy diet  Eat a diet that includes plenty of vegetables, fruits, low-fat dairy products, and lean protein. Do not eat a lot of foods that are high in solid fats, added sugars, or sodium. Maintain a healthy weight Body mass index (BMI) is used to identify weight problems. It estimates body fat based on height and weight. Your health care provider can help determine your BMI and help you achieve or maintain a healthy weight. Get regular exercise Get regular exercise. This is one of the most important things you can do for your health. Most adults should: Exercise for at least 150 minutes each week. The exercise should increase your heart rate and make you sweat (moderate-intensity exercise). Do strengthening exercises at least twice a week. This is in addition to the moderate-intensity exercise. Spend less time sitting. Even light physical activity can be beneficial. Watch cholesterol and blood lipids Have your blood tested for lipids and cholesterol at 68  years of age, then have this test every 5 years. Have your cholesterol levels checked more often if: Your lipid or cholesterol levels are high. You are older than 68 years of age. You are at high risk for heart disease. What should I know about cancer screening? Depending on your health history and family history, you may need to have cancer screening at various ages. This may include screening for: Breast cancer. Cervical cancer. Colorectal cancer. Skin cancer. Lung cancer. What should I know about heart disease, diabetes, and high blood pressure? Blood pressure and heart disease High blood pressure causes heart disease and increases the risk of stroke. This is more likely to develop in people who have high blood pressure readings or are overweight. Have your blood pressure checked: Every 3-5 years if you are 47-57 years of age. Every year if you are 86 years old or older. Diabetes Have regular diabetes screenings. This checks your fasting blood sugar level. Have the screening done: Once every three years after age 54 if you are at a normal weight and have a low risk for diabetes. More often and at a younger age if you are overweight or have a high risk for diabetes. What should I know about preventing infection? Hepatitis B If you have a higher risk for hepatitis B, you should be screened for this virus. Talk  with your health care provider to find out if you are at risk for hepatitis B infection. Hepatitis C Testing is recommended for: Everyone born from [birth year] through 1963. Anyone with known risk factors for hepatitis C. Sexually transmitted infections (STIs) Get screened for STIs, including gonorrhea and chlamydia, if: You are sexually active and are younger than 68 years of age. You are older than 68 years of age and your health care provider tells you that you are at risk for this type of infection. Your sexual activity has changed since you were last screened, and you are at  increased risk for chlamydia or gonorrhea. Ask your health care provider if you are at risk. Ask your health care provider about whether you are at high risk for HIV. Your health care provider may recommend a prescription medicine to help prevent HIV infection. If you choose to take medicine to prevent HIV, you should first get tested for HIV. You should then be tested every 3 months for as long as you are taking the medicine. Pregnancy If you are about to stop having your period (premenopausal) and you may become pregnant, seek counseling before you get pregnant. Take 400 to 800 micrograms (mcg) of folic acid every day if you become pregnant. Ask for birth control (contraception) if you want to prevent pregnancy. Osteoporosis and menopause Osteoporosis is a disease in which the bones lose minerals and strength with aging. This can result in bone fractures. If you are 36 years old or older, or if you are at risk for osteoporosis and fractures, ask your health care provider if you should: Be screened for bone loss. Take a calcium or vitamin D supplement to lower your risk of fractures. Be given hormone replacement therapy (HRT) to treat symptoms of menopause. Follow these instructions at home: Alcohol use Do not drink alcohol if: Your health care provider tells you not to drink. You are pregnant, may be pregnant, or are planning to become pregnant. If you drink alcohol: Limit how much you have to: 0-1 drink a day. Know how much alcohol is in your drink. In the [place] one drink equals one 12 oz bottle of beer (355 mL), one 5 oz glass of wine (148 mL), or one 1 oz glass of hard liquor (44 mL). Lifestyle Do not use any products that contain nicotine or tobacco. These products include cigarettes, chewing tobacco, and vaping devices, such as e-cigarettes. If you need help quitting, ask your health care provider. Do not use street drugs. Do not share needles. Ask your health care provider for help  if you need support or information about quitting drugs. General instructions Schedule regular health, dental, and eye exams. Stay current with your vaccines. Tell your health care provider if: You often feel depressed. You have ever been abused or do not feel safe at home. Summary Adopting a healthy lifestyle and getting preventive care are important in promoting health and wellness. Follow your health care provider's instructions about healthy diet, exercising, and getting tested or screened for diseases. Follow your health care provider's instructions on monitoring your cholesterol and blood pressure. This information is not intended to replace advice given to you by your health care provider. Make sure you discuss any questions you have with your health care provider. Document Revised: 04/09/2021 Document Reviewed: 04/09/2021 [employer] Patient Education  [employer].

## 2021-11-28 NOTE — Progress Notes (Signed)
Subjective:   Maria Fry is a 70 y.o. female who presents for Medicare Annual (Subsequent) preventive examination. I connected with  [name] on 11/28/21 by a audio enabled telemedicine application and verified that I am speaking with the correct person using two identifiers.  Patient Location: Home  Provider Location: Office/Clinic  I discussed the limitations of evaluation and management by telemedicine. The patient expressed understanding and agreed to proceed.  Review of Systems    Defer to PCP       Objective:    There were no vitals filed for this visit. There is no height or weight on file to calculate BMI.  Advanced Directives 11/28/2021 10/25/2021 01/18/2021 01/16/2021 10/10/2020 09/17/2020 09/16/2020  Does Patient Have a Medical Advance Directive? _0  - No  Would patient like information on creating a medical advance directive? Yes (ED - Information included in AVS) No - Patient declined No - Patient declined No - Patient declined No - Patient declined No - Patient declined -    Current Medications (verified) Outpatient Encounter Medications as of 11/28/2021  Medication Sig   acetaminophen (TYLENOL) 500 MG tablet Take 500 mg by mouth every 6 (six) hours as needed for mild pain or moderate pain.   apixaban (ELIQUIS) 5 MG TABS tablet Take 1 tablet (5 mg total) by mouth 2 (two) times daily.   atorvastatin (LIPITOR) 80 MG tablet Take 1 tablet (80 mg total) by mouth at bedtime.   calcium carbonate (OS-CAL - DOSED IN MG OF ELEMENTAL CALCIUM) 1250 (500 Ca) MG tablet Take 500 mg by mouth daily.   diclofenac Sodium (VOLTAREN) 1 % GEL Apply 1 application topically 4 (four) times daily as needed (pain).   dofetilide (TIKOSYN) 125 MCG capsule Take 1 capsule by mouth twice daily   ferrous sulfate 325 (65 FE) MG EC tablet Take 325 mg by mouth daily.   furosemide (LASIX) 40 MG tablet Take 1 tablet (40 mg total) by mouth 2 (two) times daily.   gabapentin (NEURONTIN) 300  MG capsule Take 1 capsule (300 mg total) by mouth 2 (two) times daily.   levothyroxine (SYNTHROID) 100 MCG tablet Take 1 tablet (100 mcg total) by mouth daily before breakfast.   loratadine (CLARITIN) 10 MG tablet Take 10 mg by mouth daily.   magnesium oxide (MAG-OX) 400 MG tablet Take 0.5 tablets (200 mg total) by mouth daily.   melatonin 3 MG TABS tablet Take 3 mg by mouth at bedtime.   metoprolol tartrate (LOPRESSOR) 25 MG tablet Take 1 tablet (25 mg total) by mouth 2 (two) times daily.   nitroGLYCERIN (NITROSTAT) 0.4 MG SL tablet DISSOLVE ONE TABLET UNDER THE TONGUE EVERY 5 MINUTES AS NEEDED FOR CHEST PAIN.  DO NOT EXCEED A TOTAL OF 3 DOSES IN 15 MINUTES (Patient taking differently: DISSOLVE ONE TABLET UNDER THE TONGUE EVERY 5 MINUTES AS NEEDED FOR CHEST PAIN.  DO NOT EXCEED A TOTAL OF 3 DOSES IN 15 MINUTES)   ondansetron (ZOFRAN-ODT) 4 MG disintegrating tablet Take 1 tablet (4 mg total) by mouth every 8 (eight) hours as needed for nausea or vomiting.   potassium chloride SA (KLOR-CON) 20 MEQ tablet Take 3 tablets (60 mEq total) by mouth daily.   ranolazine (RANEXA) 500 MG 12 hr tablet Take 1 tablet (500 mg total) by mouth 2 (two) times daily.   spironolactone (ALDACTONE) 25 MG tablet Take 0.5 tablets (12.5 mg total) by mouth daily.   tiZANidine (ZANAFLEX) 2 MG tablet Take  tablet (2 mg total) by mouth every 6 (six) hours as needed for muscle spasms.  ° Vitamin D, Ergocalciferol, (DRISDOL) 1.25 MG (50000 UNIT) CAPS capsule Take 1 capsule (50,000 Units total) by mouth every 7 (seven) days.  ° [DISCONTINUED] magnesium oxide (MAG-OX) 400 (240 Mg) MG tablet Take 1 tablet by mouth once daily  ° [DISCONTINUED] oseltamivir (TAMIFLU) 75 MG capsule Take 1 capsule (75 mg total) by mouth every 12 (twelve) hours.  ° °No facility-administered encounter medications on file as of 11/28/2021.  ° ° °Allergies (verified) °Afrin [oxymetazoline], Amoxicillin, Lisinopril, Ranolazine, Vicodin [hydrocodone-acetaminophen],  Demerol [meperidine], and Lodine [etodolac]  ° °History: °Past Medical History:  °Diagnosis Date  ° Arthritis   ° Right knee  ° Atrial fibrillation (HCC)   ° CAD (coronary artery disease)   ° a. 06/07/15 NSTEMI/Cath: Dist LAD 100%, mid LAD 10%. Otw nl cors. Nl EF w/ inferoapical AK.  ° Essential hypertension   ° Hip pain   ° History of cardiomyopathy 04/23/2016  ° a. 04/2016 Echo: EF 45%, Gr2 DD; b. 11/2019 Echo: EF 45-50%, mod LVH. Gr2 DD. Nl RV fxn. Sev dil LA.   ° Hyperlipidemia   ° Hypothyroidism   ° NSTEMI (non-ST elevated myocardial infarction) (HCC) 06/05/2015  ° Vitamin D deficiency   ° °Past Surgical History:  °Procedure Laterality Date  ° ABDOMINAL HYSTERECTOMY    ° partial- one ovary remaining  ° BIOPSY  09/17/2020  ° Procedure: BIOPSY;  Surgeon: [name], Charles K, DO;  Location: [facility];  Service: Endoscopy;;  ° CARDIAC CATHETERIZATION N/A 06/06/2015  ° Procedure: Left Heart Cath and Coronary Angiography;  Surgeon: Peter M Jordan, MD;  Location: [facility];  Service: Cardiovascular;  Laterality: N/A;  ° CARDIOVERSION N/A 09/14/2020  ° Procedure: CARDIOVERSION;  Surgeon: McDowell, Samuel G, MD;  Location: [facility];  Service: Cardiovascular;  Laterality: N/A;  ° CHOLECYSTECTOMY    ° COLONOSCOPY    ° COLONOSCOPY N/A 08/13/2017  ° Procedure: COLONOSCOPY;  Surgeon: [name], Robert M, MD;  [facility]: [facility];  Service: Endoscopy;  Laterality: N/A;  8:30 AM  ° COLONOSCOPY WITH PROPOFOL N/A 01/18/2021  ° [name]: Multiple tubular adenomas removed, next colonoscopy in 3 years  ° ESOPHAGOGASTRODUODENOSCOPY N/A 09/11/2016  ° Procedure: ESOPHAGOGASTRODUODENOSCOPY (EGD);  Surgeon: Robert M [name], MD;  [facility];  Service: Endoscopy;  Laterality: N/A;  7:45 am - moved to 10/11 @ 10:30  ° ESOPHAGOGASTRODUODENOSCOPY (EGD) WITH PROPOFOL N/A 09/17/2020  ° [name]: Diffuse moderate inflammation characterized by erosions and erythema found in the entire stomach.  Biopsies positive for H. pylori.   Patient has not been treated due to drug allergies.  ° Heel spurs Left   ° MALONEY DILATION N/A 09/11/2016  ° Procedure: MALONEY DILATION;  Surgeon: Robert M [name], MD;  [facility];  Service: Endoscopy;  Laterality: N/A;  ° POLYPECTOMY  08/13/2017  ° Procedure: POLYPECTOMY;  Surgeon: [name], Robert M, MD;  [facility];  Service: Endoscopy;;  colon  ° POLYPECTOMY  01/18/2021  ° Procedure: POLYPECTOMY;  Surgeon: [name], Robert M, MD;  [facility];  Service: Endoscopy;;  ° SHOULDER ARTHROSCOPY Right   ° TUBAL LIGATION    ° °Family History  °Problem Relation Age of Onset  ° Heart failure Father   °     Deceased  ° Heart attack Father   °     Deceased  ° Stroke Sister   °     Deceased  °   Heart failure Brother        Deceased   Cancer Brother        Deceased   Heart attack Brother        Deceased   COPD Daughter    Arthritis Daughter    Lupus Daughter    Colon cancer Neg Hx    Social History   Socioeconomic History   Marital status: Married    Spouse name: Not on file   Number of children: 3   Years of education: 10   Highest education level: Not on file  Occupational History   Occupation: Company secretary at [employer] Use   Smoking status: Former    Packs/day: 1.00    Years: 20.00    Pack years: 20.00    Types: Cigarettes    Start date: 12/02/1977    Quit date: 12/02/2002    Years since quitting: 19.0   Smokeless tobacco: Never  Vaping Use   Vaping Use: Never used  Substance and Sexual Activity   Alcohol use: No    Alcohol/week: 0.0 standard drinks   Drug use: No   Sexual activity: Not Currently  Other Topics Concern   Not on file  Social History Narrative   Retired from [employer].   Social Determinants of Health   Financial Resource Strain: Low Risk    Difficulty of Paying Living Expenses: Not hard at all  Food Insecurity: No Food Insecurity   Worried About Charity fundraiser in the Last Year: Never true   [place] in the  Last Year: Never true  Transportation Needs: No Transportation Needs   Lack of Transportation (Medical): No   Lack of Transportation (Non-Medical): No  Physical Activity: Insufficiently Active   Days of Exercise per Week: 5 days   Minutes of Exercise per Session: 10 min  Stress: No Stress Concern Present   Feeling of Stress : Not at all  Social Connections: Moderately Integrated   Frequency of Communication with Friends and Family: More than three times a week   Frequency of Social Gatherings with Friends and Family: More than three times a week   Attends Religious Services: More than 4 times per year   Active Member of [employer] or Organizations: No   Attends Music therapist: Never   Marital Status: Married    Tobacco Counseling Counseling given: Not Answered   Clinical Intake:  Pre-visit preparation completed: No  Pain : No/denies pain     Nutritional Risks: None Diabetes: No  How often do you need to have someone help you when you read instructions, pamphlets, or other written materials from your doctor or pharmacy?: 1 - Never What is the last grade level you completed in school?: 10th  Diabetic?no  Interpreter Needed?: No  Information entered by :: [place] of Daily Living In your present state of health, do you have any difficulty performing the following activities: 01/16/2021  Hearing? N  Vision? N  Difficulty concentrating or making decisions? N  Walking or climbing stairs? Y  Comment neuropathy and pinched nerve on right  Dressing or bathing? N  Doing errands, shopping? N  Some recent data might be hidden    Patient Care Team: Noreene Larsson, NP as PCP - General (Nurse Practitioner) Thompson Grayer, MD as PCP - Electrophysiology (Cardiology) Satira Sark, MD as PCP - Cardiology (Cardiology) Danie Binder, MD (Inactive) as Consulting Physician (Gastroenterology) Edythe Clarity, [facility] as Pharmacist (  Pharmacist)  Indicate  any recent Medical Services you may have received from other than [facility] providers in the past year (date may be approximate).     Assessment:   This is a routine wellness examination for Maria Fry.  Hearing/Vision screen No results found.  Dietary issues and exercise activities discussed:     Goals Addressed             This Visit's Progress    Pharmacy Care Plan:   Not on track    [place] (see longitudinal plan of care for additional care plan information)  Current Barriers:  Chronic Disease Management support, education, and care coordination needs related to Hypertension, Hyperlipidemia, Heart Failure, Coronary Artery Disease, and anemia   Hypertension BP Readings from Last 3 Encounters:  12/22/20 122/84  11/08/20 134/78  10/31/20 132/78  Pharmacist Clinical Goal(s): Over the next 120 days, patient will work with PharmD and providers to achieve BP goal <130/80 Current regimen:  Metoprolol 37m twice daily Interventions: Reviewed home monitoring procedures Comprehensive medication review Patient self care activities - Over the next 180 days, patient will: Check BP periodically, document, and provide at future appointments Ensure daily salt intake < 2300 mg/day  Hyperlipidemia Lab Results  Component Value Date/Time   [facility] 01/03/2020 09:48 AM  Pharmacist Clinical Goal(s): Over the next 180 days, patient will work with PharmD and providers to maintain LDL goal < 70 Current regimen:  Atorvastatin 87mdaily Interventions: Reviewed most recent lipid panel Counseled on importance of compliance with statin medications. Patient self care activities - Over the next 180 days, patient will: Continue to take medication as prescribed Focus on adherence by pill box  Heart Failure Pharmacist Clinical Goal(s) Over the next 180 days, patient will work with PharmD and providers to optimize medication and minimize symptoms related to HF. Current regimen:   Metoprolol tartrate 2517mid Furosemide 72m45mily four times per week, and twice daily 3 times per week Interventions: Counseled on importance of monitoring weight daily Patient self care activities - Over the next 180 days, patient will: Begin to monitor weight daily as directed  Contact providers if you gain more than 3 pounds in one day or 5 pounds in a week.   CAD Pharmacist Clinical Goal(s) Over the next 180 days, patient will work with PharmD and providers to optimize medication and minimize symptoms related to CAD. Current regimen:  ASA 81mg47mnxa 1000mg 23me daily Interventions: Discussed cost/affordability of Ranexa. Researched other programs patient may be eligible for copay assistance. Patient self care activities - Over the next 180 days, patient will: Continue to take medication as she has been, wait for PharmD to reach out regarding programs for copay assistance.  Anemia Pharmacist Clinical Goal(s) Over the next 120 days, patient will work with PharmD and providers to optimize medication and minimize symptoms related to anemia. Current regimen:  Ferrous Sulfate 325mg d53m Interventions: Counseled on importance of taking medication daily Discussed switching to night time dosing if makes patient tired. Reminded to d/c iron one week prior to colonoscopy Patient self care activities - Over the next 120 days, patient will: Begin to take medication in the evening to see if this helps with sleepiness  Afib Pharmacist Clinical Goal(s) Over the next 120 days, patient will work with PharmD and providers to optimize medication and minimize symptoms related to Afib Current regimen:  Eliquis 5mg twi67mdaily Tikosyn 125 mcg  Interventions: Evaluated for abnormal bleed Explained Eliquis PAP program to patient  Patient self care activities - Over the next 120 days, patient will: °Notify PharmD when $605 spend out of pocket has been met °Remember to d/c Eliquis before  colonoscopy as directed ° °Please see past updates related to this goal by clicking on the "Past Updates" button in the selected goal  ° °  ° °  °Depression Screen °PHQ 2/9 Scores 11/28/2021 09/05/2021 03/06/2021 01/30/2021 05/02/2020 01/03/2020 07/02/2019  °PHQ - 2 Score 0 0 0 0 0 0 0  °PHQ- 9 Score - - - - - - -  °  °Fall Risk °Fall Risk  11/28/2021 09/05/2021 03/06/2021 01/30/2021 05/02/2020  °Falls in the past year? 0 0 0 0 1  °Number falls in past yr: 0 0 0 0 0  °Injury with Fall? 0 0 0 0 0  °Risk Factor Category  - - - - -  °Risk for fall due to : Orthopedic patient No Fall Risks No Fall Risks No Fall Risks Impaired balance/gait  °Follow up Falls evaluation completed Falls evaluation completed Falls evaluation completed Falls evaluation completed Falls evaluation completed  ° ° °FALL RISK PREVENTION PERTAINING TO THE HOME: ° °Any stairs in or around the home? Yes  °If so, are there any without handrails? Yes  °Home free of loose throw rugs in walkways, pet beds, electrical cords, etc? No  °Adequate lighting in your home to reduce risk of falls? Yes  ° °ASSISTIVE DEVICES UTILIZED TO PREVENT FALLS: ° °Life alert? No  °Use of a cane, walker or w/c? Yes  °Grab bars in the bathroom? No  °Shower chair or bench in shower? No  °Elevated toilet seat or a handicapped toilet? No  ° ° °Cognitive Function: °  °  °6CIT Screen 11/28/2021  °What Year? 0 points  °What month? 0 points  °What time? 3 points  °Count back from 20 0 points  °Months in reverse 0 points  °Repeat phrase 0 points  °Total Score 3  ° ° °Immunizations °Immunization History  °Administered Date(s) Administered  ° Fluad Quad(high Dose 65+) 10/21/2019, 08/18/2020, 09/05/2021  ° Influenza, High Dose Seasonal PF 08/24/2018  ° Influenza,inj,Quad PF,6+ Mos 10/02/2015, 08/19/2016, 08/25/2017, 09/02/2019  ° PFIZER(Purple Top)SARS-COV-2 Vaccination 01/24/2020, 02/14/2020, 10/31/2020, 06/29/2021  ° Pneumococcal Conjugate-13 01/01/2019  ° Pneumococcal Polysaccharide-23 01/03/2020   ° ° °TDAP status: Due, Education has been provided regarding the importance of this vaccine. Advised may receive this vaccine at local pharmacy or Health Dept. Aware to provide a copy of the vaccination record if obtained from local pharmacy or Health Dept. Verbalized acceptance and understanding. ° °Flu Vaccine status: Up to date ° °Pneumococcal vaccine status: Up to date ° °Covid-19 vaccine status: Information provided on how to obtain vaccines.  ° °Qualifies for Shingles Vaccine? Yes   °Zostavax completed No   °Shingrix Completed?: No.    Education has been provided regarding the importance of this vaccine. Patient has been advised to call insurance company to determine out of pocket expense if they have not yet received this vaccine. Advised may also receive vaccine at local pharmacy or Health Dept. Verbalized acceptance and understanding. ° °Screening Tests °Health Maintenance  °Topic Date Due  ° Zoster Vaccines- Shingrix (1 of 2) Never done  ° TETANUS/TDAP  12/02/2020  ° MAMMOGRAM  02/02/2021  ° COVID-19 Vaccine (5 - Booster for Pfizer series) 08/24/2021  ° COLONOSCOPY (Pts 45-49yrs Insurance coverage will need to be confirmed)  01/18/2031  ° Pneumonia Vaccine 65+ Years old  Completed  ° INFLUENZA VACCINE  Completed  °   DEXA SCAN  Completed   Hepatitis C Screening  Completed   HPV VACCINES  Aged Out    Health Maintenance  Health Maintenance Due  Topic Date Due   Zoster Vaccines- Shingrix (1 of 2) Never done   TETANUS/TDAP  12/02/2020   MAMMOGRAM  02/02/2021   COVID-19 Vaccine (5 - Booster for Pfizer series) 08/24/2021    Colorectal cancer screening: Type of screening: Colonoscopy. Completed 01/18/2021. Repeat every 10 years  Mammogram status: Ordered 11/28/2021. Pt provided with contact info and advised to call to schedule appt.   Bone Density status: Completed 01/14/2018. Results reflect: Bone density results: NORMAL. Repeat every 2 years.  Lung Cancer Screening: (Low Dose CT Chest  recommended if Age 10-80 years, 30 pack-year currently smoking OR have quit w/in 15years.) does not qualify.   Lung Cancer Screening Referral: n/a  Additional Screening:  Hepatitis C Screening: does qualify; Completed 12/23/2016  Vision Screening: Recommended annual ophthalmology exams for early detection of glaucoma and other disorders of the eye. Is the patient up to date with their annual eye exam?  No  Who is the provider or what is the name of the office in which the patient attends annual eye exams? [facility] If pt is not established with a provider, would they like to be referred to a provider to establish care? No .   Dental Screening: Recommended annual dental exams for proper oral hygiene  Community Resource Referral / Chronic Care Management: CRR required this visit?  No   CCM required this visit?  No      Plan:     I have personally reviewed and noted the following in the patients chart:   Medical and social history Use of alcohol, tobacco or illicit drugs  Current medications and supplements including opioid prescriptions.  Functional ability and status Nutritional status Physical activity Advanced directives List of other physicians Hospitalizations, surgeries, and ER visits in previous 12 months Vitals Screenings to include cognitive, depression, and falls Referrals and appointments  In addition, I have reviewed and discussed with patient certain preventive protocols, quality metrics, and best practice recommendations. A written personalized care plan for preventive services as well as general preventive health recommendations were provided to patient.     Earline May[place], [place]   11/28/2021   Nurse Notes:  Maria Fry , Thank you for taking time to come for your Medicare Wellness Visit. I appreciate your ongoing commitment to your health goals. Please review the following plan we discussed and let me know if I can assist you in the future.   These are  the goals we discussed:  Goals      Pharmacy Care Plan:     CARE PLAN ENTRY (see longitudinal plan of care for additional care plan information)  Current Barriers:  Chronic Disease Management support, education, and care coordination needs related to Hypertension, Hyperlipidemia, Heart Failure, Coronary Artery Disease, and anemia   Hypertension BP Readings from Last 3 Encounters:  12/22/20 122/84  11/08/20 134/78  10/31/20 132/78  Pharmacist Clinical Goal(s): Over the next 120 days, patient will work with PharmD and providers to achieve BP goal <130/80 Current regimen:  Metoprolol 5m twice daily Interventions: Reviewed home monitoring procedures Comprehensive medication review Patient self care activities - Over the next 180 days, patient will: Check BP periodically, document, and provide at future appointments Ensure daily salt intake < 2300 mg/day  Hyperlipidemia Lab Results  Component Value Date/Time   LDLCALC 63 01/03/2020 09:48 AM  Pharmacist Clinical Goal(s): Over the next 180 days, patient will work with PharmD and providers to maintain LDL goal < 70 Current regimen:  Atorvastatin 26m daily Interventions: Reviewed most recent lipid panel Counseled on importance of compliance with statin medications. Patient self care activities - Over the next 180 days, patient will: Continue to take medication as prescribed Focus on adherence by pill box  Heart Failure Pharmacist Clinical Goal(s) Over the next 180 days, patient will work with PharmD and providers to optimize medication and minimize symptoms related to HF. Current regimen:  Metoprolol tartrate 29mbid Furosemide 4072maily four times per week, and twice daily 3 times per week Interventions: Counseled on importance of monitoring weight daily Patient self care activities - Over the next 180 days, patient will: Begin to monitor weight daily as directed  Contact providers if you gain more than 3 pounds in one  day or 5 pounds in a week.   CAD Pharmacist Clinical Goal(s) Over the next 180 days, patient will work with PharmD and providers to optimize medication and minimize symptoms related to CAD. Current regimen:  ASA 41m17menxa 1000mg87mce daily Interventions: Discussed cost/affordability of Ranexa. Researched other programs patient may be eligible for copay assistance. Patient self care activities - Over the next 180 days, patient will: Continue to take medication as she has been, wait for PharmD to reach out regarding programs for copay assistance.  Anemia Pharmacist Clinical Goal(s) Over the next 120 days, patient will work with PharmD and providers to optimize medication and minimize symptoms related to anemia. Current regimen:  Ferrous Sulfate 325mg 19my Interventions: Counseled on importance of taking medication daily Discussed switching to night time dosing if makes patient tired. Reminded to d/c iron one week prior to colonoscopy Patient self care activities - Over the next 120 days, patient will: Begin to take medication in the evening to see if this helps with sleepiness  Afib Pharmacist Clinical Goal(s) Over the next 120 days, patient will work with PharmD and providers to optimize medication and minimize symptoms related to Afib Current regimen:  Eliquis 5mg tw26m daily Tikosyn 125 mcg  Interventions: Evaluated for abnormal bleed Explained Eliquis PAP program to patient  Patient self care activities - Over the next 120 days, patient will: Notify PharmD when $605 [phone] of pocket has been met Remember to d/c Eliquis before colonoscopy as directed  Please see past updates related to this goal by clicking on the "Past Updates" button in the selected goal          This is a list of the screening recommended for you and due dates:  Health Maintenance  Topic Date Due   Zoster (Shingles) Vaccine (1 of 2) Never done   Tetanus Vaccine  12/02/2020   Mammogram   02/02/2021   COVID-19 Vaccine (5 - Booster for Pfizer series) 08/24/2021   Colon Cancer Screening  01/18/2031   Pneumonia Vaccine  Completed   Flu Shot  Completed   DEXA scan (bone density measurement)  Completed   Hepatitis C Screening: USPSTF Recommendation to screen - Ages 18-79 y34-79ompleted   HPV Vaccine  Aged Out

## 2021-12-01 DIAGNOSIS — G4733 Obstructive sleep apnea (adult) (pediatric): Secondary | ICD-10-CM | POA: Diagnosis not present

## 2022-01-04 ENCOUNTER — Ambulatory Visit
Admission: EM | Admit: 2022-01-04 | Discharge: 2022-01-04 | Disposition: A | Discharge status: Home / Self Care | Payer: Medicare Other | Attending: Family Medicine | Admitting: Family Medicine

## 2022-01-04 ENCOUNTER — Telehealth: Payer: Self-pay | Admitting: Emergency Medicine

## 2022-01-04 ENCOUNTER — Encounter: Payer: Self-pay | Admitting: Emergency Medicine

## 2022-01-04 ENCOUNTER — Other Ambulatory Visit: Payer: Self-pay

## 2022-01-04 DIAGNOSIS — J069 Acute upper respiratory infection, unspecified: Secondary | ICD-10-CM

## 2022-01-04 MED ORDER — FLUTICASONE PROPIONATE 50 MCG/ACT NA SUSP: 1.0000 | Freq: Two times a day (BID) | NASAL | 2 refills | Status: DC

## 2022-01-04 MED ORDER — PROMETHAZINE-DM 6.25-15 MG/5ML PO SYRP: 5.0000 mL | ORAL_SOLUTION | Freq: Four times a day (QID) | ORAL | 0 refills | Status: DC | PRN

## 2022-01-04 NOTE — Discharge Instructions (Signed)
You may take Coricidin HBP anytime you get cold or flu symptoms to help dry up the congestion and ease your symptoms

## 2022-01-04 NOTE — ED Provider Notes (Signed)
[facility]    CSN: [id number] Arrival date & time: 01/04/22  7628      History   Chief Complaint Chief Complaint  Patient presents with   Headache    HPI Maria Fry is a 71 y.o. female.   Patient presenting today with several day history of sneezing, postnasal drainage, scratchy throat, chills, cough.  She states that she tends to get something like this every year around this time and wants to catch it and nip it in the bud early.  She denies known fever, chest pain, shortness of breath, abdominal pain, nausea vomiting diarrhea, sick contacts.  So far not trying anything over-the-counter for symptoms.  She does have a history of seasonal allergies for which she takes antihistamines as needed.  She states that this does not feel like COVID to her and she does not want any viral testing.   Past Medical History:  Diagnosis Date   Arthritis    Right knee   Atrial fibrillation (HCC)    CAD (coronary artery disease)    a. 06/07/15 NSTEMI/Cath: Dist LAD 100%, mid LAD 10%. Otw nl cors. Nl EF w/ inferoapical AK.   Essential hypertension    Hip pain    History of cardiomyopathy 04/23/2016   a. 04/2016 Echo: EF 45%, Gr2 DD; b. 11/2019 Echo: EF 45-50%, mod LVH. Gr2 DD. Nl RV fxn. Sev dil LA.    Hyperlipidemia    Hypothyroidism    NSTEMI (non-ST elevated myocardial infarction) ([place]) 06/05/2015   Vitamin D deficiency     Patient Active Problem List   Diagnosis Date Noted   Hypomagnesemia 09/05/2021   Wheezing 03/06/2021   Sleep apnea 02/06/2021   Vitamin D deficiency 01/30/2021   Encounter to establish care 31/51/7616   Helicobacter pylori gastritis 11/01/2020   H/O adenomatous polyp of colon 10/31/2020   Constipation 10/15/2020   History of GI bleed 10/15/2020   Persistent atrial fibrillation ([place]) 10/10/2020   Secondary hypercoagulable state ([place]) 10/05/2020   Obesity, Class III, BMI 40-49.9 (morbid obesity) ([place])    Gastroesophageal reflux disease    Gastritis     Melena    CHF exacerbation ([place]) 09/15/2020   Stable angina ([place]) 01/14/2020   Abnormal nuclear stress test 12/08/2019   Anemia 11/28/2019   Hyperlipidemia 11/27/2019   OA (osteoarthritis) of knee 05/18/2019   DDD (degenerative disc disease), lumbar 05/18/2019   Obesity 12/26/2017   Seasonal allergies 08/19/2016   Glucose intolerance (impaired glucose tolerance) 05/14/2016   Chronic hip pain 05/14/2016   Chronic combined systolic and diastolic heart failure ([place]) 05/14/2016   Hypothyroidism following radioiodine therapy 11/30/2015   CAD (coronary artery disease)    NSTEMI (non-ST elevated myocardial infarction) ([place])    Hypertension 06/05/2015    Past Surgical History:  Procedure Laterality Date   ABDOMINAL HYSTERECTOMY     partial- one ovary remaining   BIOPSY  09/17/2020   Procedure: BIOPSY;  Surgeon: [name], DO;  [facility];  Service: Endoscopy;;   CARDIAC CATHETERIZATION N/A 06/06/2015   Procedure: Left Heart Cath and Coronary Angiography;  Surgeon: Peter M Martinique, MD;  Location: [facility];  Service: Cardiovascular;  Laterality: N/A;   CARDIOVERSION N/A 09/14/2020   Procedure: CARDIOVERSION;  Surgeon: Satira Sark, MD;  [facility]: [facility];  Service: Cardiovascular;  Laterality: N/A;   CHOLECYSTECTOMY     COLONOSCOPY     COLONOSCOPY N/A 08/13/2017   Procedure: COLONOSCOPY;  Surgeon: Daneil Dolin, MD;  [facility];  Service: Endoscopy;  Laterality: N/A;  8:30 AM   COLONOSCOPY WITH PROPOFOL N/A 01/18/2021   [name]: Multiple tubular adenomas removed, next colonoscopy in 3 years   ESOPHAGOGASTRODUODENOSCOPY N/A 09/11/2016   Procedure: ESOPHAGOGASTRODUODENOSCOPY (EGD);  Surgeon: Daneil Dolin, MD;  [facility];  Service: Endoscopy;  Laterality: N/A;  7:45 am - moved to 10/11 @ 10:30   ESOPHAGOGASTRODUODENOSCOPY (EGD) WITH PROPOFOL N/A 09/17/2020   [name]: Diffuse moderate inflammation characterized by erosions  and erythema found in the entire stomach.  Biopsies positive for H. pylori.  Patient has not been treated due to drug allergies.   Heel spurs Left    MALONEY DILATION N/A 09/11/2016   Procedure: Venia Minks DILATION;  Surgeon: Daneil Dolin, MD;  [facility];  Service: Endoscopy;  Laterality: N/A;   POLYPECTOMY  08/13/2017   Procedure: POLYPECTOMY;  Surgeon: Daneil Dolin, MD;  [facility];  Service: Endoscopy;;  colon   POLYPECTOMY  01/18/2021   Procedure: POLYPECTOMY;  Surgeon: Daneil Dolin, MD;  [facility];  Service: Endoscopy;;   SHOULDER ARTHROSCOPY Right    TUBAL LIGATION      OB History     Gravida  3   Para  3   Term  3   Preterm      AB      Living  3      SAB      IAB      Ectopic      Multiple      Live Births               Home Medications    Prior to Admission medications   Medication Sig Start Date End Date Taking? Authorizing Provider  promethazine-dextromethorphan (PROMETHAZINE-DM) 6.25-15 MG/5ML syrup Take 5 mLs by mouth 4 (four) times daily as needed. 01/04/22  Yes Volney American, PA-C  acetaminophen (TYLENOL) 500 MG tablet Take 500 mg by mouth every 6 (six) hours as needed for mild pain or moderate pain.    [provider]  apixaban (ELIQUIS) 5 MG TABS tablet Take 1 tablet (5 mg total) by mouth 2 (two) times daily. 09/12/21   Fenton, Clint R, PA  atorvastatin (LIPITOR) 80 MG tablet Take 1 tablet (80 mg total) by mouth at bedtime. 04/12/21   Noreene Larsson, NP  calcium carbonate (OS-CAL - DOSED IN MG OF ELEMENTAL CALCIUM) 1250 (500 Ca) MG tablet Take 500 mg by mouth daily.    [provider]  diclofenac Sodium (VOLTAREN) 1 % GEL Apply 1 application topically 4 (four) times daily as needed (pain).    [provider]  dofetilide (TIKOSYN) 125 MCG capsule Take 1 capsule by mouth twice daily 11/07/21   Fenton, Clint R, PA  ferrous sulfate 325 (65 FE) MG EC tablet Take 325 mg by  mouth daily.    [provider]  furosemide (LASIX) 40 MG tablet Take 1 tablet (40 mg total) by mouth 2 (two) times daily. 03/27/21   Fenton, Clint R, PA  gabapentin (NEURONTIN) 300 MG capsule Take 1 capsule (300 mg total) by mouth 2 (two) times daily. 09/10/21   Noreene Larsson, NP  levothyroxine (SYNTHROID) 100 MCG tablet Take 1 tablet (100 mcg total) by mouth daily before breakfast. 01/22/21   Nida, Marella Chimes, MD  loratadine (CLARITIN) 10 MG tablet Take 10 mg by mouth daily.    [provider]  magnesium oxide (MAG-OX) 400  MG tablet Take 0.5 tablets (200 mg total) by mouth daily. 10/24/20   Fenton, Clint R, PA  melatonin 3 MG TABS tablet Take 3 mg by mouth at bedtime.    [provider]  metoprolol tartrate (LOPRESSOR) 25 MG tablet Take 1 tablet (25 mg total) by mouth 2 (two) times daily. 09/10/21   Noreene Larsson, NP  nitroGLYCERIN (NITROSTAT) 0.4 MG SL tablet DISSOLVE ONE TABLET UNDER THE TONGUE EVERY 5 MINUTES AS NEEDED FOR CHEST PAIN.  DO NOT EXCEED A TOTAL OF 3 DOSES IN 15 MINUTES Patient taking differently: DISSOLVE ONE TABLET UNDER THE TONGUE EVERY 5 MINUTES AS NEEDED FOR CHEST PAIN.  DO NOT EXCEED A TOTAL OF 3 DOSES IN 15 MINUTES 01/08/21   Verta Ellen., NP  ondansetron (ZOFRAN-ODT) 4 MG disintegrating tablet Take 1 tablet (4 mg total) by mouth every 8 (eight) hours as needed for nausea or vomiting. 10/25/21   Maudie Flakes, MD  potassium chloride SA (KLOR-CON) 20 MEQ tablet Take 3 tablets (60 mEq total) by mouth daily. 06/29/21   Fenton, Clint R, PA  ranolazine (RANEXA) 500 MG 12 hr tablet Take 1 tablet (500 mg total) by mouth 2 (two) times daily. 06/19/21   Verta Ellen., NP  spironolactone (ALDACTONE) 25 MG tablet Take 0.5 tablets (12.5 mg total) by mouth daily. 07/24/21   Satira Sark, MD  tiZANidine (ZANAFLEX) 2 MG tablet Take 1 tablet (2 mg total) by mouth every 6 (six) hours as needed for muscle spasms. 05/24/21   Wurst, [place], PA-C   Vitamin D, Ergocalciferol, (DRISDOL) 1.25 MG (50000 UNIT) CAPS capsule Take 1 capsule (50,000 Units total) by mouth every 7 (seven) days. 09/07/21   Noreene Larsson, NP    Family History Family History  Problem Relation Age of Onset   Heart failure Father        Deceased   Heart attack Father        Deceased   Stroke Sister        Deceased   Heart failure Brother        Deceased   Cancer Brother        Deceased   Heart attack Brother        Deceased   COPD Daughter    Arthritis Daughter    Lupus Daughter    Colon cancer Neg Hx     Social History Social History   Tobacco Use   Smoking status: Former    Packs/day: 1.00    Years: 20.00    Pack years: 20.00    Types: Cigarettes    Start date: 12/02/1977    Quit date: 12/02/2002    Years since quitting: 19.1   Smokeless tobacco: Never  Vaping Use   Vaping Use: Never used  Substance Use Topics   Alcohol use: No    Alcohol/week: 0.0 standard drinks   Drug use: No     Allergies   Afrin [oxymetazoline], Amoxicillin, Lisinopril, Ranolazine, Vicodin [hydrocodone-acetaminophen], Demerol [meperidine], Flonase [fluticasone], and Lodine [etodolac]   Review of Systems Review of Systems Per HPI  Physical Exam Triage Vital Signs ED Triage Vitals [01/04/22 0957]  Enc Vitals Group     BP 135/76     Pulse Rate 74     Resp 18     Temp 97.9 F (36.6 C)     Temp Source Oral     SpO2 98 %     Weight 224 lb (101.6 kg)  Height 5' (1.524 m)     Head Circumference      Peak Flow      Pain Score 0     Pain Loc      Pain Edu?      Excl. in [place]?    No data found.  Updated Vital Signs BP 135/76 (BP Location: Right Arm)    Pulse 74    Temp 97.9 F (36.6 C) (Oral)    Resp 18    Ht 5' (1.524 m)    Wt 224 lb (101.6 kg)    SpO2 98%    BMI 43.75 kg/m   Visual Acuity Right Eye Distance:   Left Eye Distance:   Bilateral Distance:    Right Eye Near:   Left Eye Near:    Bilateral Near:     Physical Exam Vitals and  nursing note reviewed.  Constitutional:      Appearance: Normal appearance.  HENT:     Head: Atraumatic.     Right Ear: Tympanic membrane and external ear normal.     Left Ear: Tympanic membrane and external ear normal.     Nose: Rhinorrhea present.     Mouth/Throat:     Mouth: Mucous membranes are moist.     Pharynx: Posterior oropharyngeal erythema present.  Eyes:     Extraocular Movements: Extraocular movements intact.     Conjunctiva/sclera: Conjunctivae normal.  Cardiovascular:     Rate and Rhythm: Normal rate.     Heart sounds: Normal heart sounds.  Pulmonary:     Effort: Pulmonary effort is normal.     Breath sounds: Normal breath sounds. No wheezing or rales.  Musculoskeletal:        General: Normal range of motion.     Cervical back: Normal range of motion and neck supple.  Skin:    General: Skin is warm and dry.  Neurological:     Mental Status: She is alert and oriented to person, place, and time.  Psychiatric:        Mood and Affect: Mood normal.        Thought Content: Thought content normal.     UC Treatments / Results  Labs (all labs ordered are listed, but only abnormal results are displayed) Labs Reviewed - No data to display  EKG   Radiology No results found.  Procedures Procedures (including critical care time)  Medications Ordered in UC Medications - No data to display  Initial Impression / Assessment and Plan / UC Course  I have reviewed the triage vital signs and the nursing notes.  Pertinent labs & imaging results that were available during my care of the patient were reviewed by me and considered in my medical decision making (see chart for details).     Consistent with viral upper respiratory infection, vital signs and exam overall very reassuring today and she is in no acute distress.  She declines COVID and flu testing today.  We will treat with Phenergan DM, nasal sprays, Coricidin HBP and supportive home care.  Return for acutely  worsening symptoms.  Final Clinical Impressions(s) / UC Diagnoses   Final diagnoses:  Viral URI with cough     Discharge Instructions      You may take Coricidin HBP anytime you get cold or flu symptoms to help dry up the congestion and ease your symptoms    ED Prescriptions     Medication Sig Dispense Auth. Provider   promethazine-dextromethorphan (PROMETHAZINE-DM) 6.25-15 MG/5ML syrup Take 5  mLs by mouth 4 (four) times daily as needed. 100 mL Volney American, PA-C   fluticasone [facility]) 50 MCG/ACT nasal spray Place 1 spray into both nostrils 2 (two) times daily. 16 g Volney American, [place]      PDMP not reviewed this encounter.   Volney American, [place] 01/04/22 1034

## 2022-01-04 NOTE — ED Triage Notes (Addendum)
Pt reports cough, chills, throat drainage, sneezing for last few days. Pt reports "I always get something like this and I wanted to catch it early before I got chest congestion."

## 2022-01-04 NOTE — Telephone Encounter (Signed)
Pt reported while reviewing discharge instructions that could not take flonase due to anaphylactic reaction.   Pt reported afrin during triage as allergy but reports"all of those nose sprays make my throat close at discharge." PA aware, Allergies updated, and discharge px for flonase discontinued.

## 2022-01-09 ENCOUNTER — Other Ambulatory Visit (HOSPITAL_COMMUNITY): Payer: Self-pay | Admitting: *Deleted

## 2022-01-09 ENCOUNTER — Telehealth: Payer: Self-pay

## 2022-01-09 MED ORDER — DOFETILIDE 125 MCG PO CAPS: 125.0000 ug | ORAL_CAPSULE | Freq: Two times a day (BID) | ORAL | 1 refills | Status: DC

## 2022-01-09 NOTE — Telephone Encounter (Signed)
This is a [facility] clinic pt 

## 2022-01-10 ENCOUNTER — Other Ambulatory Visit: Payer: Self-pay

## 2022-01-10 MED ORDER — FUROSEMIDE 40 MG PO TABS: 40.0000 mg | ORAL_TABLET | Freq: Two times a day (BID) | ORAL | 1 refills | Status: DC

## 2022-01-14 ENCOUNTER — Other Ambulatory Visit: Payer: Self-pay

## 2022-01-14 ENCOUNTER — Telehealth: Payer: Self-pay | Admitting: "Endocrinology

## 2022-01-14 DIAGNOSIS — E890 Postprocedural hypothyroidism: Secondary | ICD-10-CM

## 2022-01-14 NOTE — Telephone Encounter (Signed)
Labs have been updated for [facility].

## 2022-01-14 NOTE — Telephone Encounter (Signed)
Can you update labs for [facility]? 

## 2022-01-15 ENCOUNTER — Encounter (HOSPITAL_COMMUNITY): Payer: Self-pay | Admitting: *Deleted

## 2022-01-15 ENCOUNTER — Emergency Department (HOSPITAL_COMMUNITY): Payer: Medicare Other

## 2022-01-15 ENCOUNTER — Emergency Department (HOSPITAL_COMMUNITY)
Admission: EM | Admit: 2022-01-15 | Discharge: 2022-01-15 | Disposition: A | Discharge status: Home / Self Care | Payer: Medicare Other | Attending: Emergency Medicine | Admitting: Emergency Medicine

## 2022-01-15 ENCOUNTER — Telehealth: Payer: Self-pay | Admitting: Cardiology

## 2022-01-15 DIAGNOSIS — I5020 Unspecified systolic (congestive) heart failure: Secondary | ICD-10-CM | POA: Insufficient documentation

## 2022-01-15 DIAGNOSIS — U071 COVID-19: Secondary | ICD-10-CM | POA: Insufficient documentation

## 2022-01-15 DIAGNOSIS — Z7901 Long term (current) use of anticoagulants: Secondary | ICD-10-CM | POA: Insufficient documentation

## 2022-01-15 DIAGNOSIS — R0602 Shortness of breath: Secondary | ICD-10-CM | POA: Diagnosis present

## 2022-01-15 LAB — HEPATIC FUNCTION PANEL
ALT: 15 U/L (ref 0–44)
AST: 19 U/L (ref 15–41)
Albumin: 3.4 g/dL — ABNORMAL LOW (ref 3.5–5.0)
Alkaline Phosphatase: 85 U/L (ref 38–126)
Bilirubin, Direct: 0.1 mg/dL (ref 0.0–0.2)
Indirect Bilirubin: 0.6 mg/dL (ref 0.3–0.9)
Total Bilirubin: 0.7 mg/dL (ref 0.3–1.2)
Total Protein: 7.6 g/dL (ref 6.5–8.1)

## 2022-01-15 LAB — CBC
HCT: 30.4 % — ABNORMAL LOW (ref 36.0–46.0)
Hemoglobin: 9.8 g/dL — ABNORMAL LOW (ref 12.0–15.0)
MCH: 29.3 pg (ref 26.0–34.0)
MCHC: 32.2 g/dL (ref 30.0–36.0)
MCV: 91 fL (ref 80.0–100.0)
Platelets: 348 10*3/uL (ref 150–400)
RBC: 3.34 MIL/uL — ABNORMAL LOW (ref 3.87–5.11)
RDW: 17.2 % — ABNORMAL HIGH (ref 11.5–15.5)
WBC: 9.7 10*3/uL (ref 4.0–10.5)
nRBC: 0 % (ref 0.0–0.2)

## 2022-01-15 LAB — RESP PANEL BY RT-PCR (FLU A&B, COVID) ARPGX2
Influenza A by PCR: NEGATIVE
Influenza B by PCR: NEGATIVE
SARS Coronavirus 2 by RT PCR: POSITIVE — AB

## 2022-01-15 LAB — BASIC METABOLIC PANEL
Anion gap: 12 (ref 5–15)
BUN: 17 mg/dL (ref 8–23)
CO2: 22 mmol/L (ref 22–32)
Calcium: 8.2 mg/dL — ABNORMAL LOW (ref 8.9–10.3)
Chloride: 102 mmol/L (ref 98–111)
Creatinine, Ser: 1.27 mg/dL — ABNORMAL HIGH (ref 0.44–1.00)
GFR, Estimated: 46 mL/min — ABNORMAL LOW (ref >60)
Glucose, Bld: 105 mg/dL — ABNORMAL HIGH (ref 70–99)
Potassium: 3.8 mmol/L (ref 3.5–5.1)
Sodium: 136 mmol/L (ref 135–145)

## 2022-01-15 LAB — TROPONIN I (HIGH SENSITIVITY)
Troponin I (High Sensitivity): 10 ng/L (ref <18)
Troponin I (High Sensitivity): 8 ng/L (ref <18)

## 2022-01-15 LAB — LACTIC ACID, PLASMA: Lactic Acid, Venous: 1.7 mmol/L (ref 0.5–1.9)

## 2022-01-15 LAB — D-DIMER, QUANTITATIVE: D-Dimer, Quant: 1.26 ug/mL-FEU — ABNORMAL HIGH (ref 0.00–0.50)

## 2022-01-15 LAB — BRAIN NATRIURETIC PEPTIDE: B Natriuretic Peptide: 766 pg/mL — ABNORMAL HIGH (ref 0.0–100.0)

## 2022-01-15 MED ORDER — FUROSEMIDE 10 MG/ML IJ SOLN: 20.0000 mg | Freq: Once | INTRAMUSCULAR | Status: DC

## 2022-01-15 MED ORDER — LEVOFLOXACIN IN D5W 750 MG/150ML IV SOLN
750.0000 mg | Freq: Once | INTRAVENOUS | Status: AC
  Administered 2022-01-15: 750 mg via INTRAVENOUS
  Filled 2022-01-15: qty 150

## 2022-01-15 MED ORDER — FUROSEMIDE 10 MG/ML IJ SOLN
40.0000 mg | Freq: Once | INTRAMUSCULAR | Status: AC
  Administered 2022-01-15: 40 mg via INTRAVENOUS
  Filled 2022-01-15: qty 4

## 2022-01-15 MED ORDER — IOHEXOL 350 MG/ML SOLN
100.0000 mL | Freq: Once | INTRAVENOUS | Status: AC | PRN
  Administered 2022-01-15: 75 mL via INTRAVENOUS

## 2022-01-15 NOTE — ED Triage Notes (Signed)
Shortness of breath with exertion, history of a fib

## 2022-01-15 NOTE — ED Provider Notes (Signed)
[facility] EMERGENCY DEPARTMENT Provider Note   CSN: [id number] Arrival date & time: 01/15/22  1447     History  Chief Complaint  Patient presents with   Shortness of Breath    [name] is a 71 y.o. female.  Patient complains of shortness of breath.  She was diagnosed with COVID about 10 days ago..  Patient also has a history of congestive heart failure  The history is provided by the patient and medical records. No language interpreter was used.  Shortness of Breath Severity:  Mild Onset quality:  Sudden Timing:  Constant Progression:  Waxing and waning Chronicity:  New Context: activity   Relieved by:  Nothing Worsened by:  Nothing Ineffective treatments:  None tried Associated symptoms: no abdominal pain, no chest pain, no cough, no headaches and no rash       Home Medications Prior to Admission medications   Medication Sig Start Date End Date Taking? Authorizing Provider  acetaminophen (TYLENOL) 500 MG tablet Take 500 mg by mouth every 6 (six) hours as needed for mild pain or moderate pain.   Yes [provider]  apixaban (ELIQUIS) 5 MG TABS tablet Take 1 tablet (5 mg total) by mouth 2 (two) times daily. 09/12/21  Yes Fenton, Clint R, PA  atorvastatin (LIPITOR) 80 MG tablet Take 1 tablet (80 mg total) by mouth at bedtime. 04/12/21  Yes Noreene Larsson, NP  calcium carbonate (OS-CAL - DOSED IN MG OF ELEMENTAL CALCIUM) 1250 (500 Ca) MG tablet Take 500 mg by mouth daily.   Yes [provider]  diclofenac Sodium (VOLTAREN) 1 % GEL Apply 1 application topically 4 (four) times daily as needed (pain).   Yes [provider]  dofetilide (TIKOSYN) 125 MCG capsule Take 1 capsule (125 mcg total) by mouth 2 (two) times daily. 01/09/22  Yes Fenton, Clint R, PA  ferrous sulfate 325 (65 FE) MG EC tablet Take 325 mg by mouth daily.   Yes [provider]  furosemide (LASIX) 40 MG tablet Take 1 tablet (40 mg total) by mouth 2 (two) times daily. 01/10/22   Yes Satira Sark, MD  gabapentin (NEURONTIN) 300 MG capsule Take 1 capsule (300 mg total) by mouth 2 (two) times daily. 09/10/21  Yes Noreene Larsson, NP  levothyroxine (SYNTHROID) 100 MCG tablet Take 1 tablet (100 mcg total) by mouth daily before breakfast. 01/22/21  Yes Nida, Marella Chimes, MD  loratadine (CLARITIN) 10 MG tablet Take 10 mg by mouth daily.   Yes [provider]  magnesium oxide (MAG-OX) 400 MG tablet Take 0.5 tablets (200 mg total) by mouth daily. 10/24/20  Yes Fenton, Clint R, PA  melatonin 3 MG TABS tablet Take 3 mg by mouth at bedtime.   Yes [provider]  metoprolol tartrate (LOPRESSOR) 25 MG tablet Take 1 tablet (25 mg total) by mouth 2 (two) times daily. 09/10/21  Yes Noreene Larsson, NP  nitroGLYCERIN (NITROSTAT) 0.4 MG SL tablet DISSOLVE ONE TABLET UNDER THE TONGUE EVERY 5 MINUTES AS NEEDED FOR CHEST PAIN.  DO NOT EXCEED A TOTAL OF 3 DOSES IN 15 MINUTES Patient taking differently: DISSOLVE ONE TABLET UNDER THE TONGUE EVERY 5 MINUTES AS NEEDED FOR CHEST PAIN.  DO NOT EXCEED A TOTAL OF 3 DOSES IN 15 MINUTES 01/08/21  Yes Verta Ellen., NP  ondansetron (ZOFRAN-ODT) 4 MG disintegrating tablet Take 1 tablet (4 mg total) by mouth every 8 (eight) hours as needed for nausea or vomiting. 10/25/21  Yes  Maudie Flakes, MD  potassium chloride SA (KLOR-CON) 20 MEQ tablet Take 3 tablets (60 mEq total) by mouth daily. 06/29/21  Yes Fenton, Clint R, PA  ranolazine (RANEXA) 500 MG 12 hr tablet Take 1 tablet (500 mg total) by mouth 2 (two) times daily. 06/19/21  Yes Verta Ellen., NP  vitamin C (ASCORBIC ACID) 500 MG tablet Take 500 mg by mouth daily.   Yes [provider]  promethazine-dextromethorphan (PROMETHAZINE-DM) 6.25-15 MG/5ML syrup Take 5 mLs by mouth 4 (four) times daily as needed. Patient not taking: Reported on 01/15/2022 01/04/22   Volney American, PA-C  spironolactone (ALDACTONE) 25 MG tablet Take 0.5 tablets (12.5 mg total) by  mouth daily. Patient not taking: Reported on 01/15/2022 07/24/21   Satira Sark, MD  tiZANidine (ZANAFLEX) 2 MG tablet Take 1 tablet (2 mg total) by mouth every 6 (six) hours as needed for muscle spasms. Patient not taking: Reported on 01/15/2022 05/24/21   Stacey Drain [place], PA-C  Vitamin D, Ergocalciferol, (DRISDOL) 1.25 MG (50000 UNIT) CAPS capsule Take 1 capsule (50,000 Units total) by mouth every 7 (seven) days. Patient not taking: Reported on 01/15/2022 09/07/21   Noreene Larsson, NP      Allergies    Afrin [oxymetazoline], Amoxicillin, Lisinopril, Ranolazine, Vicodin [hydrocodone-acetaminophen], Demerol [meperidine], Flonase [fluticasone], and Lodine [etodolac]    Review of Systems   Review of Systems  Constitutional:  Negative for appetite change and fatigue.  HENT:  Negative for congestion, ear discharge and sinus pressure.   Eyes:  Negative for discharge.  Respiratory:  Positive for shortness of breath. Negative for cough.   Cardiovascular:  Negative for chest pain.  Gastrointestinal:  Negative for abdominal pain and diarrhea.  Genitourinary:  Negative for frequency and hematuria.  Musculoskeletal:  Negative for back pain.  Skin:  Negative for rash.  Neurological:  Negative for seizures and headaches.  Psychiatric/Behavioral:  Negative for hallucinations.    Physical Exam Updated Vital Signs BP (!) 129/58    Pulse 63    Temp 98.2 F (36.8 C) (Oral)    Resp 20    SpO2 94%  Physical Exam Vitals and nursing note reviewed.  Constitutional:      Appearance: She is well-developed.  HENT:     Head: Normocephalic.     Nose: Nose normal.  Eyes:     General: No scleral icterus.    Conjunctiva/sclera: Conjunctivae normal.  Neck:     Thyroid: No thyromegaly.  Cardiovascular:     Rate and Rhythm: Normal rate and regular rhythm.     Heart sounds: No murmur heard.   No friction rub. No gallop.  Pulmonary:     Breath sounds: No stridor. No wheezing or rales.  Chest:     Chest  wall: No tenderness.  Abdominal:     General: There is no distension.     Tenderness: There is no abdominal tenderness. There is no rebound.  Musculoskeletal:        General: Normal range of motion.     Cervical back: Neck supple.  Lymphadenopathy:     Cervical: No cervical adenopathy.  Skin:    Findings: No erythema or rash.  Neurological:     Mental Status: She is alert and oriented to person, place, and time.     Motor: No abnormal muscle tone.     Coordination: Coordination normal.  Psychiatric:        Behavior: Behavior normal.    ED Results / Procedures /  Treatments   Labs (all labs ordered are listed, but only abnormal results are displayed) Labs Reviewed  RESP PANEL BY RT-PCR (FLU A&B, COVID) ARPGX2 - Abnormal; Notable for the following components:      Result Value   SARS Coronavirus 2 by RT PCR POSITIVE (*)    All other components within normal limits  BASIC METABOLIC PANEL - Abnormal; Notable for the following components:   Glucose, Bld 105 (*)    Creatinine, Ser 1.27 (*)    Calcium 8.2 (*)    GFR, Estimated 46 (*)    All other components within normal limits  CBC - Abnormal; Notable for the following components:   RBC 3.34 (*)    Hemoglobin 9.8 (*)    HCT 30.4 (*)    RDW 17.2 (*)    All other components within normal limits  D-DIMER, QUANTITATIVE - Abnormal; Notable for the following components:   D-Dimer, Quant 1.26 (*)    All other components within normal limits  HEPATIC FUNCTION PANEL - Abnormal; Notable for the following components:   Albumin 3.4 (*)    All other components within normal limits  BRAIN NATRIURETIC PEPTIDE - Abnormal; Notable for the following components:   B Natriuretic Peptide 766.0 (*)    All other components within normal limits  CULTURE, BLOOD (ROUTINE X 2)  CULTURE, BLOOD (ROUTINE X 2)  LACTIC ACID, PLASMA  TROPONIN I (HIGH SENSITIVITY)  TROPONIN I (HIGH SENSITIVITY)    EKG EKG Interpretation  Date/Time:  Tuesday January 15 2022 14:59:31 EST Ventricular Rate:  75 PR Interval:  162 QRS Duration: 116 QT Interval:  378 QTC Calculation: 422 R Axis:   54 Text Interpretation: Normal sinus rhythm ST & T wave abnormality, consider inferolateral ischemia Abnormal ECG When compared with ECG of 09-Nov-2021 09:57, ST now depressed in Inferior leads ST now depressed in Lateral leads T wave inversion now evident in Inferior leads QT has shortened Confirmed by Milton Ferguson [id number]) on 01/15/2022 3:40:06 PM  Radiology DG Chest 2 View  Result Date: 01/15/2022 CLINICAL DATA:  Shortness of breath history of COVID EXAM: CHEST - 2 VIEW COMPARISON:  09/16/2020 FINDINGS: Mild cardiomegaly. Patchy opacity left lung base could reflect atelectasis or mild pneumonia. No pleural effusion or pneumothorax. IMPRESSION: 1. Patchy left lung base opacity could reflect atelectasis or pneumonia 2. Cardiomegaly Electronically Signed   By: Donavan Foil M.D.   On: 01/15/2022 15:30   CT Angio Chest PE W and/or Wo Contrast  Result Date: 01/15/2022 CLINICAL DATA:  Dyspnea on exertion for 1 week EXAM: CT ANGIOGRAPHY CHEST WITH CONTRAST TECHNIQUE: Multidetector CT imaging of the chest was performed using the standard protocol during bolus administration of intravenous contrast. Multiplanar CT image reconstructions and MIPs were obtained to evaluate the vascular anatomy. RADIATION DOSE REDUCTION: This exam was performed according to the departmental dose-optimization program which includes automated exposure control, adjustment of the mA and/or kV according to patient size and/or use of iterative reconstruction technique. CONTRAST:  21mL OMNIPAQUE IOHEXOL 350 MG/ML SOLN COMPARISON:  01/15/2022 FINDINGS: Cardiovascular: This is a technically adequate evaluation of the pulmonary vasculature. No filling defects or pulmonary emboli. The heart is enlarged, with prominent left atrial dilation. No pericardial effusion. Normal caliber of the thoracic aorta. Mild  atherosclerosis of the aorta and coronary vasculature. Mediastinum/Nodes: No enlarged mediastinal, hilar, or axillary lymph nodes. Thyroid gland, trachea, and esophagus demonstrate no significant findings. Lungs/Pleura: Trace bilateral pleural effusions. Minimal dependent right lower lobe atelectasis. Scattered ground-glass consolidation throughout  the lungs may reflect areas of air trapping or early edema. No pneumothorax. Central airways are patent. Upper Abdomen: No acute abnormality. Musculoskeletal: No acute or destructive bony lesions. Reconstructed images demonstrate no additional findings. Review of the MIP images confirms the above findings. IMPRESSION: 1. Marked cardiomegaly, with scattered ground-glass airspace disease and trace effusions consistent with mild congestive heart failure. 2. No evidence of pulmonary embolus. 3.  Aortic Atherosclerosis (ICD10-I70.0). Electronically Signed   By: Randa Ngo M.D.   On: 01/15/2022 18:51    Procedures Procedures    Medications Ordered in ED Medications  levofloxacin (LEVAQUIN) IVPB 750 mg (0 mg Intravenous Stopped 01/15/22 1912)  iohexol (OMNIPAQUE) 350 MG/ML injection 100 mL (75 mLs Intravenous Contrast Given 01/15/22 1826)  furosemide (LASIX) injection 40 mg (40 mg Intravenous Given 01/15/22 2104)    ED Course/ Medical Decision Making/ A&P                           Medical Decision Making Amount and/or Complexity of Data Reviewed Labs: ordered. Radiology: ordered.  Risk Prescription drug management.   Patient with congestive heart failure.  She diuresed well with Lasix and we will increase her Lasix so she is taking it twice a day and she will follow-up with either PCP or cardiology   This patient presents to the ED for concern of shortness of breath, this involves an extensive number of treatment options, and is a complaint that carries with it a high risk of complications and morbidity.  The differential diagnosis includes worsening  COVID, PE, heart failure, pneumonia   Co morbidities that complicate the patient evaluation COVID, congestive heart failure  Additional history obtained:  Additional history obtained from family External records from outside source obtained and reviewed including old records   Lab Tests:  I Ordered, and personally interpreted labs.  The pertinent results include: CBC chemistries BMP.  BNP elevated at 766.  Troponin is normal.  D-dimer elevated.  Hemoglobin 9.8 mildly low   Imaging Studies ordered:  I ordered imaging studies including chest x-ray and CT angio of the chest I independently visualized and interpreted imaging which showed no PE no pneumonia mild congestive heart failure I agree with the radiologist interpretation   Cardiac Monitoring:  The patient was maintained on a cardiac monitor.  I personally viewed and interpreted the cardiac monitored which showed an underlying rhythm of: Normal sinus rhythm   Medicines ordered and prescription drug management:  I ordered medication including Lasix for heart failure Reevaluation of the patient after these medicines showed that the patient improved I have reviewed the patients home medicines and have made adjustments as needed   Test Considered:  None   Critical Interventions:  IV Lasix   Consultations Obtained:  No consult  Problem List / ED Course:  Congestive heart failure and COVID   Reevaluation:  After the interventions noted above, I reevaluated the patient and found that they have :improved   Social Determinants of Health:  None   Dispostion:  After consideration of the diagnostic results and the patients response to treatment, I feel that the patent would benefit from discharge home with close follow-up with her doctor and increase her Lasix to twice a day.         Final Clinical Impression(s) / ED Diagnoses Final diagnoses:  Systolic congestive heart failure, unspecified HF  chronicity ([place])    Rx / DC Orders ED Discharge Orders  None         Milton Ferguson, MD 01/18/22 1040

## 2022-01-15 NOTE — Discharge Instructions (Signed)
Increase your Lasix that you are taking 40 mg twice a day for the next 3 days and follow-up with either your family doctor or cardiologist within a week.  Return if problems

## 2022-01-15 NOTE — Telephone Encounter (Signed)
Reports having covid on 01/04/22. Reports she has not contacted her PCP regarding her ongoing SOB Reports heaviness in chest when walking rated 10/10, extreme SOB and fatigue after walking a few feet Reports dizziness  Reports symptoms started last week Advised that she needed to go to the ED for an evaluation with these symptoms Verbalized understanding of plan

## 2022-01-15 NOTE — Telephone Encounter (Signed)
Pt c/o Shortness Of Breath: STAT if SOB developed within the last 24 hours or pt is noticeably SOB on the phone  1. Are you currently SOB (can you hear that pt is SOB on the phone)? No   2. How long have you been experiencing SOB? A week   3. Are you SOB when sitting or when up moving around? Moving around   4. Are you currently experiencing any other symptoms? Excessive fatigue   States this has been occurring since she got Covid.

## 2022-01-18 LAB — T4, FREE: Free T4: 1.68 ng/dL (ref 0.82–1.77)

## 2022-01-18 LAB — TSH: TSH: 3.21 u[IU]/mL (ref 0.450–4.500)

## 2022-01-20 LAB — CULTURE, BLOOD (ROUTINE X 2)
Culture: NO GROWTH
Culture: NO GROWTH
Special Requests: ADEQUATE
Special Requests: ADEQUATE

## 2022-01-23 ENCOUNTER — Encounter: Payer: Self-pay | Admitting: "Endocrinology

## 2022-01-23 ENCOUNTER — Other Ambulatory Visit: Payer: Self-pay

## 2022-01-23 ENCOUNTER — Ambulatory Visit (INDEPENDENT_AMBULATORY_CARE_PROVIDER_SITE_OTHER): Payer: Medicare Other | Admitting: "Endocrinology

## 2022-01-23 VITALS — BP 132/84 | HR 60 | Ht 60.0 in | Wt 222.4 lb

## 2022-01-23 DIAGNOSIS — E890 Postprocedural hypothyroidism: Secondary | ICD-10-CM | POA: Diagnosis not present

## 2022-01-23 MED ORDER — LEVOTHYROXINE SODIUM 100 MCG PO TABS: 100.0000 ug | ORAL_TABLET | Freq: Every day | ORAL | 1 refills | Status: DC

## 2022-01-23 NOTE — Progress Notes (Signed)
01/23/2022   Endocrinology follow-up note    Subjective:    Patient ID: Maria Fry, female    DOB: [birth year],    Past Medical History:  Diagnosis Date   Arthritis    Right knee   Atrial fibrillation ([place])    CAD (coronary artery disease)    a. 06/07/15 NSTEMI/Cath: Dist LAD 100%, mid LAD 10%. Otw nl cors. Nl EF w/ inferoapical AK.   Essential hypertension    Hip pain    History of cardiomyopathy 04/23/2016   a. 04/2016 Echo: EF 45%, Gr2 DD; b. 11/2019 Echo: EF 45-50%, mod LVH. Gr2 DD. Nl RV fxn. Sev dil LA.    Hyperlipidemia    Hypothyroidism    NSTEMI (non-ST elevated myocardial infarction) ([place]) 06/05/2015   Vitamin D deficiency    Past Surgical History:  Procedure Laterality Date   ABDOMINAL HYSTERECTOMY     partial- one ovary remaining   BIOPSY  09/17/2020   Procedure: BIOPSY;  Surgeon: [name], DO;  [facility];  Service: Endoscopy;;   CARDIAC CATHETERIZATION N/A 06/06/2015   Procedure: Left Heart Cath and Coronary Angiography;  Surgeon: Peter M Martinique, MD;  Location: [facility];  Service: Cardiovascular;  Laterality: N/A;   CARDIOVERSION N/A 09/14/2020   Procedure: CARDIOVERSION;  Surgeon: Satira Sark, MD;  [facility];  Service: Cardiovascular;  Laterality: N/A;   CHOLECYSTECTOMY     COLONOSCOPY     COLONOSCOPY N/A 08/13/2017   Procedure: COLONOSCOPY;  Surgeon: Daneil Dolin, MD;  [facility];  Service: Endoscopy;  Laterality: N/A;  8:30 AM   COLONOSCOPY WITH PROPOFOL N/A 01/18/2021   [name]: Multiple tubular adenomas removed, next colonoscopy in 3 years   ESOPHAGOGASTRODUODENOSCOPY N/A 09/11/2016   Procedure: ESOPHAGOGASTRODUODENOSCOPY (EGD);  Surgeon: Daneil Dolin, MD;  [facility];  Service: Endoscopy;  Laterality: N/A;  7:45 am - moved to 10/11 @ 10:30   ESOPHAGOGASTRODUODENOSCOPY (EGD) WITH PROPOFOL N/A 09/17/2020   [name]: Diffuse moderate inflammation characterized by erosions and  erythema found in the entire stomach.  Biopsies positive for H. pylori.  Patient has not been treated due to drug allergies.   Heel spurs Left    MALONEY DILATION N/A 09/11/2016   Procedure: Venia Minks DILATION;  Surgeon: Daneil Dolin, MD;  [facility];  Service: Endoscopy;  Laterality: N/A;   POLYPECTOMY  08/13/2017   Procedure: POLYPECTOMY;  Surgeon: Daneil Dolin, MD;  [facility];  Service: Endoscopy;;  colon   POLYPECTOMY  01/18/2021   Procedure: POLYPECTOMY;  Surgeon: Daneil Dolin, MD;  [facility];  Service: Endoscopy;;   SHOULDER ARTHROSCOPY Right    TUBAL LIGATION     Social History   Socioeconomic History   Marital status: Married    Spouse name: Not on file   Number of children: 3   Years of education: 10   Highest education level: Not on file  Occupational History   Occupation: team leader at [employer]  Tobacco Use   Smoking status: Former    Packs/day: 1.00    Years: 20.00    Pack years: 20.00    Types: Cigarettes    Start date: 12/02/1977    Quit date: 12/02/2002    Years since quitting: 19.1   Smokeless tobacco: Never  Vaping Use   Vaping Use: Never used  Substance and Sexual Activity   Alcohol use: No    Alcohol/week: 0.0 standard drinks  Drug use: No   Sexual activity: Not Currently  Other Topics Concern   Not on file  Social History Narrative   Retired from [employer].   Social Determinants of Health   Financial Resource Strain: Low Risk    Difficulty of Paying Living Expenses: Not hard at all  Food Insecurity: No Food Insecurity   Worried About Charity fundraiser in the Last Year: Never true   [place] in the Last Year: Never true  Transportation Needs: No Transportation Needs   Lack of Transportation (Medical): No   Lack of Transportation (Non-Medical): No  Physical Activity: Insufficiently Active   Days of Exercise per Week: 5 days   Minutes of Exercise per Session: 10 min  Stress: No  Stress Concern Present   Feeling of Stress : Not at all  Social Connections: Moderately Integrated   Frequency of Communication with Friends and Family: More than three times a week   Frequency of Social Gatherings with Friends and Family: More than three times a week   Attends Religious Services: More than 4 times per year   Active Member of [employer] or Organizations: No   Attends Archivist Meetings: Never   Marital Status: Married   Outpatient Encounter Medications as of 01/23/2022  Medication Sig   promethazine-dextromethorphan (PROMETHAZINE-DM) 6.25-15 MG/5ML syrup Take 5 mLs by mouth 4 (four) times daily as needed.   spironolactone (ALDACTONE) 25 MG tablet Take 0.5 tablets (12.5 mg total) by mouth daily.   Vitamin D, Ergocalciferol, (DRISDOL) 1.25 MG (50000 UNIT) CAPS capsule Take 1 capsule (50,000 Units total) by mouth every 7 (seven) days.   acetaminophen (TYLENOL) 500 MG tablet Take 500 mg by mouth every 6 (six) hours as needed for mild pain or moderate pain.   apixaban (ELIQUIS) 5 MG TABS tablet Take 1 tablet (5 mg total) by mouth 2 (two) times daily.   atorvastatin (LIPITOR) 80 MG tablet Take 1 tablet (80 mg total) by mouth at bedtime.   calcium carbonate (OS-CAL - DOSED IN MG OF ELEMENTAL CALCIUM) 1250 (500 Ca) MG tablet Take 500 mg by mouth daily.   diclofenac Sodium (VOLTAREN) 1 % GEL Apply 1 application topically 4 (four) times daily as needed (pain).   dofetilide (TIKOSYN) 125 MCG capsule Take 1 capsule (125 mcg total) by mouth 2 (two) times daily.   ferrous sulfate 325 (65 FE) MG EC tablet Take 325 mg by mouth daily.   furosemide (LASIX) 40 MG tablet Take 1 tablet (40 mg total) by mouth 2 (two) times daily.   gabapentin (NEURONTIN) 300 MG capsule Take 1 capsule (300 mg total) by mouth 2 (two) times daily.   levothyroxine (SYNTHROID) 100 MCG tablet Take 1 tablet (100 mcg total) by mouth daily before breakfast.   loratadine (CLARITIN) 10 MG tablet Take 10 mg by mouth  daily.   magnesium oxide (MAG-OX) 400 MG tablet Take 0.5 tablets (200 mg total) by mouth daily.   melatonin 3 MG TABS tablet Take 3 mg by mouth at bedtime.   metoprolol tartrate (LOPRESSOR) 25 MG tablet Take 1 tablet (25 mg total) by mouth 2 (two) times daily.   nitroGLYCERIN (NITROSTAT) 0.4 MG SL tablet DISSOLVE ONE TABLET UNDER THE TONGUE EVERY 5 MINUTES AS NEEDED FOR CHEST PAIN.  DO NOT EXCEED A TOTAL OF 3 DOSES IN 15 MINUTES (Patient taking differently: DISSOLVE ONE TABLET UNDER THE TONGUE EVERY 5 MINUTES AS NEEDED FOR CHEST PAIN.  DO NOT EXCEED A TOTAL OF 3 DOSES  IN 15 MINUTES)   ondansetron (ZOFRAN-ODT) 4 MG disintegrating tablet Take 1 tablet (4 mg total) by mouth every 8 (eight) hours as needed for nausea or vomiting.   potassium chloride SA (KLOR-CON) 20 MEQ tablet Take 3 tablets (60 mEq total) by mouth daily.   ranolazine (RANEXA) 500 MG 12 hr tablet Take 1 tablet (500 mg total) by mouth 2 (two) times daily.   vitamin C (ASCORBIC ACID) 500 MG tablet Take 500 mg by mouth daily.   [DISCONTINUED] levothyroxine (SYNTHROID) 100 MCG tablet Take 1 tablet (100 mcg total) by mouth daily before breakfast.   [DISCONTINUED] tiZANidine (ZANAFLEX) 2 MG tablet Take 1 tablet (2 mg total) by mouth every 6 (six) hours as needed for muscle spasms. (Patient not taking: Reported on 01/15/2022)   No facility-administered encounter medications on file as of 01/23/2022.   ALLERGIES: Allergies  Allergen Reactions   Afrin [Oxymetazoline] Anaphylaxis   Amoxicillin Anaphylaxis   Lisinopril Anaphylaxis   Ranolazine Anaphylaxis    Dizzy, Reaction occurred with generic med   Vicodin [Hydrocodone-Acetaminophen] Diarrhea and Nausea And Vomiting   Demerol [Meperidine] Nausea And Vomiting   Flonase [Fluticasone]     anaphylaxis   Lodine [Etodolac] Rash   VACCINATION STATUS: Immunization History  Administered Date(s) Administered   Fluad Quad(high Dose 65+) 10/21/2019, 08/18/2020, 09/05/2021   Influenza, High  Dose Seasonal PF 08/24/2018   Influenza,inj,Quad PF,6+ Mos 10/02/2015, 08/19/2016, 08/25/2017, 09/02/2019   PFIZER(Purple Top)SARS-COV-2 Vaccination 01/24/2020, 02/14/2020, 10/31/2020, 06/29/2021   Pneumococcal Conjugate-13 01/01/2019   Pneumococcal Polysaccharide-23 01/03/2020    Thyroid Problem Presents for follow-up (She was given RAI therapy for Graves' disease in the middle of September 2016.  She is currently on levothyroxine 100 mcg for a subsequent RAI induced hypothyroidism.) visit. Patient reports no cold intolerance, diarrhea, fatigue, heat intolerance or palpitations. The symptoms have been improving (She denies palpitations, tremors, heat/cold intolerance.   She denies dysphagia or shortness of breath, nor voice change.). Past treatments include iodine 131. The treatment provided significant relief. Risk factors include prior iodine 131 therapy.  Review of systems : Limited as above.  Objective:    BP 132/84    Pulse 60    Ht 5' (1.524 m)    Wt 222 lb 6.4 oz (100.9 kg)    BMI 43.43 kg/m   Wt Readings from Last 3 Encounters:  01/23/22 222 lb 6.4 oz (100.9 kg)  01/04/22 224 lb (101.6 kg)  11/09/21 224 lb 6.4 oz (101.8 kg)      Results for orders placed or performed during the hospital encounter of 01/15/22  Blood culture (routine x 2)   Specimen: BLOOD  Result Value Ref Range   Specimen Description BLOOD LEFT ANTECUBITAL    Special Requests      BOTTLES DRAWN AEROBIC AND ANAEROBIC Blood Culture adequate volume   Culture      NO GROWTH 5 DAYS Performed at [facility], [address] [place], [place] [address]    Report Status 01/20/2022 FINAL   Blood culture (routine x 2)   Specimen: BLOOD  Result Value Ref Range   Specimen Description BLOOD RIGHT ANTECUBITAL    Special Requests      BOTTLES DRAWN AEROBIC AND ANAEROBIC Blood Culture adequate volume   Culture      NO GROWTH 5 DAYS Performed at [facility], [address], [place], [place] [address]    Report  Status 01/20/2022 FINAL   Resp Panel by RT-PCR (Flu A&B, Covid) Nasopharyngeal Swab   Specimen: Nasopharyngeal  Swab; Nasopharyngeal(NP) swabs in vial transport medium  Result Value Ref Range   SARS Coronavirus 2 by RT PCR POSITIVE (A) NEGATIVE   Influenza A by PCR NEGATIVE NEGATIVE   Influenza B by PCR NEGATIVE NEGATIVE  Basic metabolic panel  Result Value Ref Range   Sodium 136 135 - 145 mmol/L   Potassium 3.8 3.5 - 5.1 mmol/L   Chloride 102 98 - 111 mmol/L   CO2 22 22 - 32 mmol/L   Glucose, Bld 105 (H) 70 - 99 mg/dL   BUN 17 8 - 23 mg/dL   Creatinine, Ser 1.27 (H) 0.44 - 1.00 mg/dL   Calcium 8.2 (L) 8.9 - 10.3 mg/dL   GFR, Estimated 46 (L) >60 mL/min   Anion gap 12 5 - 15  CBC  Result Value Ref Range   WBC 9.7 4.0 - 10.5 K/uL   RBC 3.34 (L) 3.87 - 5.11 MIL/uL   Hemoglobin 9.8 (L) 12.0 - 15.0 g/dL   HCT 30.4 (L) 36.0 - 46.0 %   MCV 91.0 80.0 - 100.0 fL   MCH 29.3 26.0 - 34.0 pg   MCHC 32.2 30.0 - 36.0 g/dL   RDW 17.2 (H) 11.5 - 15.5 %   Platelets 348 150 - 400 K/uL   nRBC 0.0 0.0 - 0.2 %  D-dimer, quantitative  Result Value Ref Range   D-Dimer, Quant 1.26 (H) 0.00 - 0.50 ug/mL-FEU  Hepatic function panel  Result Value Ref Range   Total Protein 7.6 6.5 - 8.1 g/dL   Albumin 3.4 (L) 3.5 - 5.0 g/dL   AST 19 15 - 41 U/L   ALT 15 0 - 44 U/L   Alkaline Phosphatase 85 38 - 126 U/L   Total Bilirubin 0.7 0.3 - 1.2 mg/dL   Bilirubin, Direct 0.1 0.0 - 0.2 mg/dL   Indirect Bilirubin 0.6 0.3 - 0.9 mg/dL  Brain natriuretic peptide  Result Value Ref Range   B Natriuretic Peptide 766.0 (H) 0.0 - 100.0 pg/mL  Lactic acid, plasma  Result Value Ref Range   Lactic Acid, Venous 1.7 0.5 - 1.9 mmol/L  Troponin I (High Sensitivity)  Result Value Ref Range   Troponin I (High Sensitivity) 10 <18 ng/L  Troponin I (High Sensitivity)  Result Value Ref Range   Troponin I (High Sensitivity) 8 <18 ng/L     Assessment & Plan:   1.  RAI induced Hypothyroidism:   She is status post RAI  therapy in September 2016 For hyperthyroidism. -Her previsit thyroid function tests are consistent with appropriate replacement.  She is advised to continue levothyroxine 100 mcg p.o. daily before breakfast.     - We discussed about the correct intake of her thyroid hormone, on empty stomach at fasting, with water, separated by at least 30 minutes from breakfast and other medications,  and separated by more than 4 hours from calcium, iron, multivitamins, acid reflux medications (PPIs). -Patient is made aware of the fact that thyroid hormone replacement is needed for life, dose to be adjusted by periodic monitoring of thyroid function tests.  She has mild chronic hypocalcemia.  She is advised to resume and continue her calcium carbonate 500 mg p.o. once a day at lunch or supper. I advised patient to maintain close follow up with her PCP for primary care needs.   I spent 21 minutes in the care of the patient today including review of labs from Thyroid Function, CMP, and other relevant labs ; imaging/biopsy records (current and previous including abstractions from  other facilities); face-to-face time discussing  her lab results and symptoms, medications doses, her options of short and long term treatment based on the latest standards of care / guidelines;   and documenting the encounter.  Maria Fry  participated in the discussions, expressed understanding, and voiced agreement with the above plans.  All questions were answered to her satisfaction. she is encouraged to contact clinic should she have any questions or concerns prior to her return visit.   Follow up plan: Return in about 6 months (around 07/23/2022) for F/U with Pre-visit Labs.  Glade Lloyd, MD Phone: [phone]  Fax: [phone]  -  This note was partially dictated with voice recognition software. Similar sounding words can be transcribed inadequately or may not  be corrected upon review.  01/23/2022, 12:54 PM

## 2022-01-27 NOTE — Progress Notes (Signed)
Cardiology Office Note  Date: 01/28/2022   ID: Maria Fry, DOB 11-21-1951, MRN [id number]  PCP:  Renee Rival, FNP  Cardiologist:  Rozann Lesches, MD Electrophysiologist:  Thompson Grayer, MD   Chief Complaint  Patient presents with   Cardiac follow-up    History of Present Illness: Maria Fry is a 71 y.o. female last seen in August 2022.  She has had interval follow-up in the atrial fibrillation clinic.  She is here for a routine visit.  She was seen in the ER on February 14 with shortness of breath, recent COVID-19 infection.  She was diagnosed with congestive heart failure by Dr. [name] and Lasix dose was increased.  Chest CTA described marked cardiomegaly with evidence of mild congestive heart failure and trace pleural effusions.  She tells me that she feels better.  She took the higher Lasix dose for 3 days, and is now back on the prior regimen.  Her weight is stable.  She has not had any chest pain or palpitations.  Still does have intermittent leg swelling.  Last echocardiogram was in March 2022 at which point LVEF was 50 to 55%.  We discussed getting an updated study particular in light of her recent symptoms and COVID-19 infection.  CHA2DS2-VASc score is 5.  Past Medical History:  Diagnosis Date   Arthritis    Right knee   Atrial fibrillation ([facility])    CAD (coronary artery disease)    a. 06/07/15 NSTEMI/Cath: Dist LAD 100%, mid LAD 10%. Otw nl cors. Nl EF w/ inferoapical AK.   Essential hypertension    Hip pain    History of cardiomyopathy 04/23/2016   a. 04/2016 Echo: EF 45%, Gr2 DD; b. 11/2019 Echo: EF 45-50%, mod LVH. Gr2 DD. Nl RV fxn. Sev dil LA.    Hyperlipidemia    Hypothyroidism    NSTEMI (non-ST elevated myocardial infarction) ([place]) 06/05/2015   Vitamin D deficiency     Past Surgical History:  Procedure Laterality Date   ABDOMINAL HYSTERECTOMY     partial- one ovary remaining   BIOPSY  09/17/2020   Procedure: BIOPSY;  Surgeon: [name], DO;  [facility];  Service: Endoscopy;;   CARDIAC CATHETERIZATION N/A 06/06/2015   Procedure: Left Heart Cath and Coronary Angiography;  Surgeon: Peter M Martinique, MD;  Location: [facility];  Service: Cardiovascular;  Laterality: N/A;   CARDIOVERSION N/A 09/14/2020   Procedure: CARDIOVERSION;  Surgeon: Satira Sark, MD;  [facility];  Service: Cardiovascular;  Laterality: N/A;   CHOLECYSTECTOMY     COLONOSCOPY     COLONOSCOPY N/A 08/13/2017   Procedure: COLONOSCOPY;  Surgeon: Daneil Dolin, MD;  [facility];  Service: Endoscopy;  Laterality: N/A;  8:30 AM   COLONOSCOPY WITH PROPOFOL N/A 01/18/2021   [name]: Multiple tubular adenomas removed, next colonoscopy in 3 years   ESOPHAGOGASTRODUODENOSCOPY N/A 09/11/2016   Procedure: ESOPHAGOGASTRODUODENOSCOPY (EGD);  Surgeon: Daneil Dolin, MD;  [facility];  Service: Endoscopy;  Laterality: N/A;  7:45 am - moved to 10/11 @ 10:30   ESOPHAGOGASTRODUODENOSCOPY (EGD) WITH PROPOFOL N/A 09/17/2020   [name]: Diffuse moderate inflammation characterized by erosions and erythema found in the entire stomach.  Biopsies positive for H. pylori.  Patient has not been treated due to drug allergies.   Heel spurs Left    MALONEY DILATION N/A 09/11/2016   Procedure: Venia Minks DILATION;  Surgeon: Daneil Dolin, MD;  [facility];  Service: Endoscopy;  Laterality: N/A;   POLYPECTOMY  08/13/2017   Procedure: POLYPECTOMY;  Surgeon: Daneil Dolin, MD;  [facility];  Service: Endoscopy;;  colon   POLYPECTOMY  01/18/2021   Procedure: POLYPECTOMY;  Surgeon: Daneil Dolin, MD;  [facility];  Service: Endoscopy;;   SHOULDER ARTHROSCOPY Right    TUBAL LIGATION      Current Outpatient Medications  Medication Sig Dispense Refill   acetaminophen (TYLENOL) 500 MG tablet Take 500 mg by mouth every 6 (six) hours as needed for mild pain or moderate pain.     apixaban (ELIQUIS) 5 MG  TABS tablet Take 1 tablet (5 mg total) by mouth 2 (two) times daily. 60 tablet 11   atorvastatin (LIPITOR) 80 MG tablet Take 1 tablet (80 mg total) by mouth at bedtime. 90 tablet 3   calcium carbonate (OS-CAL - DOSED IN MG OF ELEMENTAL CALCIUM) 1250 (500 Ca) MG tablet Take 500 mg by mouth daily.     diclofenac Sodium (VOLTAREN) 1 % GEL Apply 1 application topically 4 (four) times daily as needed (pain).     dofetilide (TIKOSYN) 125 MCG capsule Take 1 capsule (125 mcg total) by mouth 2 (two) times daily. 180 capsule 1   ferrous sulfate 325 (65 FE) MG EC tablet Take 325 mg by mouth daily.     furosemide (LASIX) 40 MG tablet Take 1 tablet (40 mg total) by mouth 2 (two) times daily. 180 tablet 1   gabapentin (NEURONTIN) 300 MG capsule Take 1 capsule (300 mg total) by mouth 2 (two) times daily. 60 capsule 2   levothyroxine (SYNTHROID) 100 MCG tablet Take 1 tablet (100 mcg total) by mouth daily before breakfast. 90 tablet 1   loratadine (CLARITIN) 10 MG tablet Take 10 mg by mouth daily.     magnesium oxide (MAG-OX) 400 MG tablet Take 0.5 tablets (200 mg total) by mouth daily. 30 tablet 6   melatonin 3 MG TABS tablet Take 3 mg by mouth at bedtime.     metoprolol tartrate (LOPRESSOR) 25 MG tablet Take 1 tablet (25 mg total) by mouth 2 (two) times daily. 180 tablet 1   nitroGLYCERIN (NITROSTAT) 0.4 MG SL tablet DISSOLVE ONE TABLET UNDER THE TONGUE EVERY 5 MINUTES AS NEEDED FOR CHEST PAIN.  DO NOT EXCEED A TOTAL OF 3 DOSES IN 15 MINUTES (Patient taking differently: DISSOLVE ONE TABLET UNDER THE TONGUE EVERY 5 MINUTES AS NEEDED FOR CHEST PAIN.  DO NOT EXCEED A TOTAL OF 3 DOSES IN 15 MINUTES) 25 tablet 3   ondansetron (ZOFRAN-ODT) 4 MG disintegrating tablet Take 1 tablet (4 mg total) by mouth every 8 (eight) hours as needed for nausea or vomiting. 20 tablet 0   potassium chloride SA (KLOR-CON) 20 MEQ tablet Take 3 tablets (60 mEq total) by mouth daily. 90 tablet 1   promethazine-dextromethorphan  (PROMETHAZINE-DM) 6.25-15 MG/5ML syrup Take 5 mLs by mouth 4 (four) times daily as needed. 100 mL 0   ranolazine (RANEXA) 500 MG 12 hr tablet Take 1 tablet (500 mg total) by mouth 2 (two) times daily. 60 tablet 6   spironolactone (ALDACTONE) 25 MG tablet Take 0.5 tablets (12.5 mg total) by mouth daily. 45 tablet 1   vitamin C (ASCORBIC ACID) 500 MG tablet Take 500 mg by mouth daily.     Vitamin D, Ergocalciferol, (DRISDOL) 1.25 MG (50000 UNIT) CAPS capsule Take 1 capsule (50,000 Units total) by mouth every 7 (seven) days. 13 capsule 3   No current  facility-administered medications for this visit.   Allergies:  Afrin [oxymetazoline], Amoxicillin, Lisinopril, Ranolazine, Vicodin [hydrocodone-acetaminophen], Demerol [meperidine], Flonase [fluticasone], and Lodine [etodolac]   ROS: No orthopnea or PND.  Physical Exam: VS:  BP 122/70    Pulse 71    Ht 5' (1.524 m)    Wt 223 lb 3.2 oz (101.2 kg)    SpO2 97%    BMI 43.59 kg/m , BMI Body mass index is 43.59 kg/m.  Wt Readings from Last 3 Encounters:  01/28/22 223 lb 3.2 oz (101.2 kg)  01/23/22 222 lb 6.4 oz (100.9 kg)  01/04/22 224 lb (101.6 kg)    General: Patient appears comfortable at rest. HEENT: Conjunctiva and lids normal, wearing a mask. Neck: Supple, no elevated JVP or carotid bruits, no thyromegaly. Lungs: Clear to auscultation, nonlabored breathing at rest. Cardiac: Regular rate and rhythm, no S3, 1/6 systolic murmur, no pericardial rub. Extremities: Mild lower leg edema.  ECG:  An ECG dated 01/16/2022 was personally reviewed today and demonstrated:  Sinus rhythm with diffuse nonspecific ST-T wave abnormalities.  Recent Labwork: 11/09/2021: Magnesium 2.3 01/15/2022: ALT 15; AST 19; B Natriuretic Peptide 766.0; BUN 17; Creatinine, Ser 1.27; Hemoglobin 9.8; Platelets 348; Potassium 3.8; Sodium 136 01/17/2022: TSH 3.210     Component Value Date/Time   CHOL 111 09/06/2021 0928   TRIG 45 09/06/2021 0928   HDL 47 09/06/2021 0928    CHOLHDL 2.7 01/03/2020 0948   VLDL 12 06/23/2017 0902   LDLCALC 53 09/06/2021 0928   LDLCALC 63 01/03/2020 0948    Other Studies Reviewed Today:  Echocardiogram 02/08/2021:  1. Left ventricular ejection fraction, by estimation, is 50 to 55%. The  left ventricle has low normal function. The left ventricle has no regional  wall motion abnormalities. The left ventricular internal cavity size was  mildly dilated. There is mild  left ventricular hypertrophy. Left ventricular diastolic parameters are  indeterminate.   2. Right ventricular systolic function is normal. The right ventricular  size is normal.   3. Left atrial size was severely dilated.   4. Right atrial size was moderately dilated.   5. The mitral valve is abnormal. Mild to moderate mitral valve  regurgitation. No evidence of mitral stenosis.   6. The aortic valve is tricuspid. There is mild calcification of the  aortic valve. There is mild thickening of the aortic valve. Aortic valve  regurgitation is mild. No aortic stenosis is present.   7. Moderate pulmonary hypertension, PASP is 52 mmHg.   8. The inferior vena cava is normal in size with greater than 50%  respiratory variability, suggesting right atrial pressure of 3 mmHg.   Chest CTA 01/15/2022: IMPRESSION: 1. Marked cardiomegaly, with scattered ground-glass airspace disease and trace effusions consistent with mild congestive heart failure. 2. No evidence of pulmonary embolus. 3.  Aortic Atherosclerosis (ICD10-I70.0).  Assessment and Plan:  1.  HFpEF, echocardiogram from March 2022 revealed LVEF 50 to 55%.  She reports recent shortness of breath following COVID-19, diagnosed with heart failure exacerbation at recent ER visit.  Chest CT described marked cardiomegaly.  We will obtain a follow-up echocardiogram to ensure no decrease in LVEF.  She is back on her prior regimen including Lasix with potassium supplement, metoprolol, and Aldactone.  2.  Paroxysmal to  persistent atrial fibrillation with CHA2DS2-VASc score of 5.  She is maintaining sinus rhythm on Tikosyn and continues on Eliquis for stroke prophylaxis.  I reviewed her recent lab work.  3.  CAD with known occlusion of  the distal LAD being managed medically.  She reports no active angina symptoms and remains on Lopressor, Lipitor, and Ranexa.  Medication Adjustments/Labs and Tests Ordered: Current medicines are reviewed at length with the patient today.  Concerns regarding medicines are outlined above.   Tests Ordered: Orders Placed This Encounter  Procedures   ECHOCARDIOGRAM COMPLETE    Medication Changes: No orders of the defined types were placed in this encounter.   Disposition:  Follow up test results and determine next visit.  Signed, Satira Sark, MD, [facility] 01/28/2022 9:52 AM    [facility] [place], [place], [place] [address] Phone: [phone]; Fax: [phone]

## 2022-01-28 ENCOUNTER — Encounter: Payer: Self-pay | Admitting: Cardiology

## 2022-01-28 ENCOUNTER — Ambulatory Visit: Payer: Medicare Other | Admitting: Cardiology

## 2022-01-28 ENCOUNTER — Other Ambulatory Visit: Payer: Self-pay

## 2022-01-28 VITALS — BP 122/70 | HR 71 | Ht 60.0 in | Wt 223.2 lb

## 2022-01-28 DIAGNOSIS — I4819 Other persistent atrial fibrillation: Secondary | ICD-10-CM | POA: Diagnosis not present

## 2022-01-28 DIAGNOSIS — I25119 Atherosclerotic heart disease of native coronary artery with unspecified angina pectoris: Secondary | ICD-10-CM | POA: Diagnosis not present

## 2022-01-28 DIAGNOSIS — I5032 Chronic diastolic (congestive) heart failure: Secondary | ICD-10-CM | POA: Diagnosis not present

## 2022-01-28 DIAGNOSIS — R0602 Shortness of breath: Secondary | ICD-10-CM | POA: Diagnosis not present

## 2022-01-28 NOTE — Patient Instructions (Addendum)
Medication Instructions:  Continue all current medications.  Labwork: none  Testing/Procedures:  Your physician has requested that you have an echocardiogram. Echocardiography is a painless test that uses sound waves to create images of your heart. It provides your doctor with information about the size and shape of your heart and how well your heart's chambers and valves are working. This procedure takes approximately one hour. There are no restrictions for this procedure.  Office will contact with results via phone or letter.    Follow-Up: Pending test results.  Any Other Special Instructions Will Be Listed Below (If Applicable).  If you need a refill on your cardiac medications before your next appointment, please call your pharmacy.  

## 2022-01-30 ENCOUNTER — Ambulatory Visit (INDEPENDENT_AMBULATORY_CARE_PROVIDER_SITE_OTHER): Payer: Medicare Other

## 2022-01-30 DIAGNOSIS — R0602 Shortness of breath: Secondary | ICD-10-CM

## 2022-01-30 LAB — ECHOCARDIOGRAM COMPLETE
Area-P 1/2: 1.98 cm2
MV M vel: 5.16 m/s
MV Peak grad: 106.5 mmHg
P 1/2 time: 607 msec
S' Lateral: 4.47 cm

## 2022-02-05 ENCOUNTER — Telehealth: Payer: Self-pay | Admitting: *Deleted

## 2022-02-05 NOTE — Telephone Encounter (Signed)
-----   Message from Satira Sark, MD sent at 01/30/2022  4:57 PM EST ----- ?Results reviewed.  LVEF mildly reduced at 45 to 50% with grade 2 diastolic dysfunction, also pulmonary hypertension as noted previously.  Please clarify her current diuretic regimen and potassium supplement as I suspect we are going to need a higher standing dose.  Also change Lopressor to Coreg 6.25 mg twice daily.  Schedule follow-up in the next 6 weeks. ?

## 2022-02-05 NOTE — Telephone Encounter (Signed)
Patient informed and verbalized understanding of plan. Copy sent to PCP 

## 2022-02-06 MED ORDER — CARVEDILOL 6.25 MG PO TABS: 6.2500 mg | ORAL_TABLET | Freq: Two times a day (BID) | ORAL | 3 refills | Status: DC

## 2022-02-06 NOTE — Telephone Encounter (Signed)
Patient informed and verbalized understanding of plan. 

## 2022-03-04 NOTE — Progress Notes (Signed)
? ?Cardiology Office Note   ? ?Date:  03/18/2022  ? ?ID:  Maria Fry, DOB 1951/02/25, MRN [id number] ? ? ?PCP:  Renee Rival, FNP ?  ?[place]  ?Cardiologist:  Rozann Lesches, MD   ?Advanced Practice Provider:  No care team member to display ?Electrophysiologist:  Thompson Grayer, MD  ? ?[id number]}  ? ?Chief Complaint  ?Patient presents with  ? Leg Swelling  ? Shortness of Breath  ? ? ?History of Present Illness:  ?Maria Fry is a 71 y.o. female with history of paroxysmal atrial fibrillation on Tikosyn and Eliquis, CAD with known occlusion of the distal LAD managed medically, heart failure preserved EF 50 to 55% on echo in 01/2021. ? ?Patient saw Dr. Domenic Polite 01/28/2022 complaining of shortness of breath since COVID-19 infection and recent heart failure exacerbation.  Repeat echo 01/30/2022 showed reduced LVEF 45 to 50% with grade 2 DD and pulmonary hypertension as noted previously.  Dr. Domenic Polite changed her metoprolol to carvedilol 6.25 mg twice daily and continued current dose diuretics but thought she might need higher dose going forward. ? ?Patient comes in for f/u. She says coreg makes her dizzy when she bends over or lays down or gets up to go to the bathroom. Also dizzy if she turns her head quickly.   BP running 130's. She decreased coreg to once daily but it hasn't helped. She also stopped spironolactone. Still having swelling if she has her legs dangling or on her feet. Down in the am. Eats out 2-3 times/week. Eating canned soups. ? ? ? ?Past Medical History:  ?Diagnosis Date  ? Arthritis   ? Right knee  ? Atrial fibrillation ([place])   ? CAD (coronary artery disease)   ? a. 06/07/15 NSTEMI/Cath: Dist LAD 100%, mid LAD 10%. Otw nl cors. Nl EF w/ inferoapical AK.  ? Essential hypertension   ? Hip pain   ? History of cardiomyopathy 04/23/2016  ? a. 04/2016 Echo: EF 45%, Gr2 DD; b. 11/2019 Echo: EF 45-50%, mod LVH. Gr2 DD. Nl RV fxn. Sev dil LA.   ? Hyperlipidemia   ? Hypothyroidism   ?  NSTEMI (non-ST elevated myocardial infarction) ([place]) 06/05/2015  ? Vitamin D deficiency   ? ? ?Past Surgical History:  ?Procedure Laterality Date  ? ABDOMINAL HYSTERECTOMY    ? partial- one ovary remaining  ? BIOPSY  09/17/2020  ? Procedure: BIOPSY;  Surgeon: [name], DO;  [facility];  Service: Endoscopy;;  ? CARDIAC CATHETERIZATION N/A 06/06/2015  ? Procedure: Left Heart Cath and Coronary Angiography;  Surgeon: Peter M Martinique, MD;  Location: [facility];  Service: Cardiovascular;  Laterality: N/A;  ? CARDIOVERSION N/A 09/14/2020  ? Procedure: CARDIOVERSION;  Surgeon: Satira Sark, MD;  [facility];  Service: Cardiovascular;  Laterality: N/A;  ? CHOLECYSTECTOMY    ? COLONOSCOPY    ? COLONOSCOPY N/A 08/13/2017  ? Procedure: COLONOSCOPY;  Surgeon: Daneil Dolin, MD;  [facility];  Service: Endoscopy;  Laterality: N/A;  8:30 AM  ? COLONOSCOPY WITH PROPOFOL N/A 01/18/2021  ? [name]: Multiple tubular adenomas removed, next colonoscopy in 3 years  ? ESOPHAGOGASTRODUODENOSCOPY N/A 09/11/2016  ? Procedure: ESOPHAGOGASTRODUODENOSCOPY (EGD);  Surgeon: Daneil Dolin, MD;  [facility];  Service: Endoscopy;  Laterality: N/A;  7:45 am - moved to 10/11 @ 10:30  ? ESOPHAGOGASTRODUODENOSCOPY (EGD) WITH PROPOFOL N/A 09/17/2020  ? [name]: Diffuse moderate inflammation characterized by erosions and erythema  found in the entire stomach.  Biopsies positive for H. pylori.  Patient has not been treated due to drug allergies.  ? Heel spurs Left   ? MALONEY DILATION N/A 09/11/2016  ? Procedure: MALONEY DILATION;  Surgeon: Daneil Dolin, MD;  [facility];  Service: Endoscopy;  Laterality: N/A;  ? POLYPECTOMY  08/13/2017  ? Procedure: POLYPECTOMY;  Surgeon: Daneil Dolin, MD;  [facility];  Service: Endoscopy;;  colon  ? POLYPECTOMY  01/18/2021  ? Procedure: POLYPECTOMY;  Surgeon: Daneil Dolin, MD;  [facility];  Service: Endoscopy;;   ? SHOULDER ARTHROSCOPY Right   ? TUBAL LIGATION    ? ? ?Current Medications: ?Current Meds  ?Medication Sig  ? acetaminophen (TYLENOL) 500 MG tablet Take 500 mg by mouth every 6 (six) hours as needed for mild pain or moderate pain.  ? apixaban (ELIQUIS) 5 MG TABS tablet Take 1 tablet (5 mg total) by mouth 2 (two) times daily.  ? atorvastatin (LIPITOR) 80 MG tablet Take 1 tablet (80 mg total) by mouth at bedtime.  ? calcium carbonate (OS-CAL - DOSED IN MG OF ELEMENTAL CALCIUM) 1250 (500 Ca) MG tablet Take 500 mg by mouth daily.  ? Cholecalciferol (VITAMIN D3) 50 MCG (2000 UT) capsule Take 1 capsule (2,000 Units total) by mouth daily.  ? diclofenac Sodium (VOLTAREN) 1 % GEL Apply 1 application topically 4 (four) times daily as needed (pain).  ? dofetilide (TIKOSYN) 125 MCG capsule Take 1 capsule (125 mcg total) by mouth 2 (two) times daily.  ? ferrous sulfate 325 (65 FE) MG EC tablet Take 325 mg by mouth daily.  ? furosemide (LASIX) 40 MG tablet Take 1 tablet (40 mg total) by mouth 2 (two) times daily.  ? gabapentin (NEURONTIN) 300 MG capsule Take 1 capsule (300 mg total) by mouth 2 (two) times daily.  ? levothyroxine (SYNTHROID) 100 MCG tablet Take 1 tablet (100 mcg total) by mouth daily before breakfast.  ? loratadine (CLARITIN) 10 MG tablet Take 10 mg by mouth daily.  ? magnesium oxide (MAG-OX) 400 MG tablet Take 0.5 tablets (200 mg total) by mouth daily.  ? melatonin 3 MG TABS tablet Take 3 mg by mouth at bedtime.  ? metoprolol tartrate (LOPRESSOR) 25 MG tablet Take 1 tablet (25 mg total) by mouth 2 (two) times daily.  ? nitroGLYCERIN (NITROSTAT) 0.4 MG SL tablet DISSOLVE ONE TABLET UNDER THE TONGUE EVERY 5 MINUTES AS NEEDED FOR CHEST PAIN.  DO NOT EXCEED A TOTAL OF 3 DOSES IN 15 MINUTES (Patient taking differently: DISSOLVE ONE TABLET UNDER THE TONGUE EVERY 5 MINUTES AS NEEDED FOR CHEST PAIN.  DO NOT EXCEED A TOTAL OF 3 DOSES IN 15 MINUTES)  ? ondansetron (ZOFRAN-ODT) 4 MG disintegrating tablet Take 1 tablet (4  mg total) by mouth every 8 (eight) hours as needed for nausea or vomiting.  ? potassium chloride SA (KLOR-CON) 20 MEQ tablet Take 3 tablets (60 mEq total) by mouth daily. (Patient taking differently: Take 40 mEq by mouth daily.)  ? ranolazine (RANEXA) 500 MG 12 hr tablet Take 1 tablet (500 mg total) by mouth 2 (two) times daily.  ? spironolactone (ALDACTONE) 25 MG tablet Take 0.5 tablets (12.5 mg total) by mouth daily.  ? vitamin C (ASCORBIC ACID) 500 MG tablet Take 500 mg by mouth daily.  ? [DISCONTINUED] carvedilol (COREG) 6.25 MG tablet Take 1 tablet (6.25 mg total) by mouth 2 (two) times daily.  ?  ? ?Allergies:   Afrin [  oxymetazoline], Amoxicillin, Lisinopril, Ranolazine, Vicodin [hydrocodone-acetaminophen], Demerol [meperidine], Flonase [fluticasone], and Lodine [etodolac]  ? ?Social History  ? ?Socioeconomic History  ? Marital status: Married  ?  Spouse name: Not on file  ? Number of children: 3  ? Years of education: 10  ? Highest education level: Not on file  ?Occupational History  ? Occupation: Company secretary at [employer]  ?Tobacco Use  ? Smoking status: Former  ?  Packs/day: 1.00  ?  Years: 20.00  ?  Pack years: 20.00  ?  Types: Cigarettes  ?  Start date: 12/02/1977  ?  Quit date: 12/02/2002  ?  Years since quitting: 19.3  ? Smokeless tobacco: Never  ?Vaping Use  ? Vaping Use: Never used  ?Substance and Sexual Activity  ? Alcohol use: No  ?  Alcohol/week: 0.0 standard drinks  ? Drug use: No  ? Sexual activity: Not Currently  ?Other Topics Concern  ? Not on file  ?Social History Narrative  ? Retired from [employer].  ? ?Social Determinants of Health  ? ?Financial Resource Strain: Low Risk   ? Difficulty of Paying Living Expenses: Not hard at all  ?Food Insecurity: No Food Insecurity  ? Worried About Charity fundraiser in the Last Year: Never true  ? Ran Out of Food in the Last Year: Never true  ?Transportation Needs: No Transportation Needs  ? Lack of Transportation (Medical): No  ? Lack of  Transportation (Non-Medical): No  ?Physical Activity: Insufficiently Active  ? Days of Exercise per Week: 5 days  ? Minutes of Exercise per Session: 10 min  ?Stress: No Stress Concern Present  ? Feeling of Stress

## 2022-03-06 ENCOUNTER — Encounter: Payer: Self-pay | Admitting: Nurse Practitioner

## 2022-03-06 ENCOUNTER — Ambulatory Visit: Payer: Medicare Other | Admitting: Nurse Practitioner

## 2022-03-06 ENCOUNTER — Ambulatory Visit (INDEPENDENT_AMBULATORY_CARE_PROVIDER_SITE_OTHER): Payer: Medicare Other | Admitting: Nurse Practitioner

## 2022-03-06 VITALS — BP 130/75 | HR 69 | Ht 60.0 in | Wt 223.0 lb

## 2022-03-06 DIAGNOSIS — E559 Vitamin D deficiency, unspecified: Secondary | ICD-10-CM | POA: Diagnosis not present

## 2022-03-06 DIAGNOSIS — E785 Hyperlipidemia, unspecified: Secondary | ICD-10-CM

## 2022-03-06 DIAGNOSIS — E890 Postprocedural hypothyroidism: Secondary | ICD-10-CM | POA: Diagnosis not present

## 2022-03-06 DIAGNOSIS — I10 Essential (primary) hypertension: Secondary | ICD-10-CM

## 2022-03-06 DIAGNOSIS — I5042 Chronic combined systolic (congestive) and diastolic (congestive) heart failure: Secondary | ICD-10-CM

## 2022-03-06 NOTE — Assessment & Plan Note (Signed)
Followed by Dr. [name] takes Synthroid 100 mcg tablet daily.  Patient encouraged to maintain close follow-up with Dr. [name] ?

## 2022-03-06 NOTE — Assessment & Plan Note (Signed)
Takes atorvastatin 80 mg daily ?Check lipid panel today ?

## 2022-03-06 NOTE — Progress Notes (Signed)
? ?  Maria Fry     MRN: [id number]      DOB: 06/29/1951 ? ? ?HPI ?Maria Fry with past medical history of hypertension, CAD, CHF, hyperlipidemia, vitamin D deficiency, obesity is here for follow up and re-evaluation of chronic medical conditions, medication management ? ? ?The PT denies any adverse reactions to current medications since the last visit.  ?There are no new concerns.  ?There are no specific complaints  ? ?Due for shingles vaccine, TDAP vaccine, pt encouraged to get vaccines at her pharmacy.  She verbalized understanding ? ? ? ?ROS ?Denies recent fever or chills. ?Denies sinus pressure, nasal congestion, ear pain or sore throat. ?Denies chest congestion, productive cough or wheezing. ?Denies chest pains, palpitations  ?Denies abdominal pain, nausea, vomiting,diarrhea or constipation.   ?Denies dysuria, frequency, hesitancy or incontinence. ?Denies joint pain, swelling and limitation in mobility. ?Denies headaches, seizures, numbness, or tingling. ?Denies depression, anxiety or insomnia. ? ? ? ?PE ? ?BP 130/75 (BP Location: Right Arm, Patient Position: Sitting, Cuff Size: Large)   Pulse 69   Ht 5' (1.524 m)   Wt 223 lb (101.2 kg)   SpO2 98%   BMI 43.55 kg/m?  ? ?Patient alert and oriented and in no cardiopulmonary distress. ? ?Chest: Clear to auscultation bilaterally. ? ?CVS: S1, S2 no murmurs, no S3.Regular rate. ? ?ABD: Soft non tender.  ? ?Ext: non pitting edema bilateral lower extremities ? ?MS: Adequate ROM spine, shoulders, hips and knees. ? ?Skin: Intact, no ulcerations or rash noted. ? ?Psych: Good eye contact, normal affect. Memory intact not anxious or depressed appearing. ? ? ? ? ?Assessment & Plan ?Obesity, Class III, BMI 40-49.9 (morbid obesity) ([place]) ?Wt Readings from Last 3 Encounters:  ?03/06/22 223 lb (101.2 kg)  ?01/28/22 223 lb 3.2 oz (101.2 kg)  ?01/23/22 222 lb 6.4 oz (100.9 kg)  ?Patient states that she has not been exercising but [facility] ?Need to exercise 30 minutes daily 5 days a week as tolerated discussed with patient, importance of healthy food choices including plenty of vegetables and protein, less carbohydrate drinking water instead of juice, portion control also discussed with patient.  Patient verbalized understanding ? ?Hypertension ?BP Readings from Last 3 Encounters:  ?03/06/22 130/75  ?01/28/22 122/70  ?01/23/22 132/84  ?Chronic condition well-controlled, followed by cardiology ?Takes carvedilol 6.25 mg twice daily, Lasix 40 mg twice daily, ?Continue current medications ?Check BMP today to reevaluate kidney function ?DASH diet advised, encouraged patient to exercise regularly as tolerated ? ?Hypothyroidism following radioiodine therapy ?Followed by Dr. [name] takes Synthroid 100 mcg tablet daily.  Patient encouraged to maintain close follow-up with Dr. [name] ? ?Vitamin D deficiency ?Last vitamin D level 25.1 ?Takes vitamin D 1000 units daily ?Check vitamin D labs today ? ?Hyperlipidemia ?Takes atorvastatin 80 mg daily ?Check lipid panel today ? ?Chronic combined systolic and diastolic heart failure ([name]) ?Followed by cardiology ?On Lasix 40 mg twice daily ?Patient encouraged to wear TED hose to help decrease leg swelling, keep legs elevated when sitting ?Patient encouraged to maintain close follow-up with cardiology verbalized understanding  ? ?

## 2022-03-06 NOTE — Assessment & Plan Note (Signed)
Last vitamin D level 25.1 ?Takes vitamin D 1000 units daily ?Check vitamin D labs today ?

## 2022-03-06 NOTE — Assessment & Plan Note (Addendum)
BP Readings from Last 3 Encounters:  ?03/06/22 130/75  ?01/28/22 122/70  ?01/23/22 132/84  ?Chronic condition well-controlled, followed by cardiology ?Takes carvedilol 6.25 mg twice daily, Lasix 40 mg twice daily, ?Continue current medications ?Check BMP today to reevaluate kidney function ?DASH diet advised, encouraged patient to exercise regularly as tolerated ?

## 2022-03-06 NOTE — Assessment & Plan Note (Signed)
Followed by cardiology ?On Lasix 40 mg twice daily ?Patient encouraged to wear TED hose to help decrease leg swelling, keep legs elevated when sitting ?Patient encouraged to maintain close follow-up with cardiology verbalized understanding ?

## 2022-03-06 NOTE — Patient Instructions (Signed)
Please get your shingles vaccine, TDAP vaccine at your pharmacy.  ? ?It is important that you exercise regularly at least 30 minutes 5 times a week.  ?Think about what you will eat, plan ahead. ?Choose " clean, green, fresh or frozen" over canned, processed or packaged foods which are more sugary, salty and fatty. ?70 to 75% of food eaten should be vegetables and fruit. ?Three meals at set times with snacks allowed between meals, but they must be fruit or vegetables. ?Aim to eat over a 12 hour period , example 7 am to 7 pm, and STOP after  your last meal of the day. ?Drink water,generally about 64 ounces per day, no other drink is as healthy. Fruit juice is best enjoyed in a healthy way, by EATING the fruit. ? ?Thanks for choosing [facility], we consider it a privelige to serve you. ? ?

## 2022-03-06 NOTE — Assessment & Plan Note (Addendum)
Wt Readings from Last 3 Encounters:  ?03/06/22 223 lb (101.2 kg)  ?01/28/22 223 lb 3.2 oz (101.2 kg)  ?01/23/22 222 lb 6.4 oz (100.9 kg)  ?Patient states that she has not been exercising but [facility] ?Need to exercise 30 minutes daily 5 days a week as tolerated discussed with patient, importance of healthy food choices including plenty of vegetables and protein, less carbohydrate drinking water instead of juice, portion control also discussed with patient.  Patient verbalized understanding ?

## 2022-03-07 ENCOUNTER — Other Ambulatory Visit: Payer: Self-pay | Admitting: Nurse Practitioner

## 2022-03-07 ENCOUNTER — Telehealth: Payer: Self-pay

## 2022-03-07 DIAGNOSIS — E559 Vitamin D deficiency, unspecified: Secondary | ICD-10-CM

## 2022-03-07 LAB — BASIC METABOLIC PANEL
BUN/Creatinine Ratio: 14 (ref 12–28)
BUN: 15 mg/dL (ref 8–27)
CO2: 23 mmol/L (ref 20–29)
Calcium: 8.7 mg/dL (ref 8.7–10.3)
Chloride: 102 mmol/L (ref 96–106)
Creatinine, Ser: 1.06 mg/dL — ABNORMAL HIGH (ref 0.57–1.00)
Glucose: 90 mg/dL (ref 70–99)
Potassium: 4.4 mmol/L (ref 3.5–5.2)
Sodium: 136 mmol/L (ref 134–144)
eGFR: 57 mL/min/{1.73_m2} — ABNORMAL LOW (ref >59)

## 2022-03-07 LAB — VITAMIN D 25 HYDROXY (VIT D DEFICIENCY, FRACTURES): Vit D, 25-Hydroxy: 22.7 ng/mL — ABNORMAL LOW (ref 30.0–100.0)

## 2022-03-07 LAB — LIPID PANEL
Chol/HDL Ratio: 2.6 ratio (ref 0.0–4.4)
Cholesterol, Total: 123 mg/dL (ref 100–199)
HDL: 48 mg/dL (ref >39)
LDL Chol Calc (NIH): 63 mg/dL (ref 0–99)
Triglycerides: 51 mg/dL (ref 0–149)
VLDL Cholesterol Cal: 12 mg/dL (ref 5–40)

## 2022-03-07 MED ORDER — VITAMIN D3 50 MCG (2000 UT) PO CAPS: 2000.0000 [IU] | ORAL_CAPSULE | Freq: Every day | ORAL | 3 refills | Status: AC

## 2022-03-07 NOTE — Telephone Encounter (Signed)
Patient returning nurse call. Please return call at [phone]. ?

## 2022-03-07 NOTE — Telephone Encounter (Signed)
Returning call for lab results °

## 2022-03-07 NOTE — Progress Notes (Signed)
Please review results with patient ? ?Vitamin D level is still low, patient should start taking vitamin D 2000 units daily ?Kidney function is stable, continue current medications and follow-up with cardiology as planned ? ?Cholesterol level is stable,Eat a healthy diet, including lots of fruits and vegetables. ?Avoid foods with a lot of saturated and trans fats, such as red meat, butter, fried foods and cheese . ?Maintain a healthy weight. ?

## 2022-03-07 NOTE — Telephone Encounter (Signed)
Called pt no answer left vm 

## 2022-03-07 NOTE — Telephone Encounter (Signed)
Returned pt's call no answer left vm ?

## 2022-03-12 ENCOUNTER — Encounter: Payer: Self-pay | Admitting: Nurse Practitioner

## 2022-03-12 NOTE — Telephone Encounter (Signed)
Patient returning call. Call back # [phone] ?

## 2022-03-12 NOTE — Telephone Encounter (Signed)
Called pt back advised of lab results pt verbalized understanding ?

## 2022-03-18 ENCOUNTER — Encounter: Payer: Self-pay | Admitting: Internal Medicine

## 2022-03-18 ENCOUNTER — Encounter: Payer: Self-pay | Admitting: Physician Assistant

## 2022-03-18 ENCOUNTER — Ambulatory Visit: Payer: Medicare Other | Admitting: Physician Assistant

## 2022-03-18 VITALS — BP 136/78 | HR 62 | Ht 60.0 in | Wt 222.0 lb

## 2022-03-18 DIAGNOSIS — I5042 Chronic combined systolic (congestive) and diastolic (congestive) heart failure: Secondary | ICD-10-CM | POA: Diagnosis not present

## 2022-03-18 DIAGNOSIS — I2510 Atherosclerotic heart disease of native coronary artery without angina pectoris: Secondary | ICD-10-CM

## 2022-03-18 DIAGNOSIS — I480 Paroxysmal atrial fibrillation: Secondary | ICD-10-CM | POA: Diagnosis not present

## 2022-03-18 MED ORDER — METOPROLOL TARTRATE 25 MG PO TABS: 25.0000 mg | ORAL_TABLET | Freq: Two times a day (BID) | ORAL | 6 refills | Status: DC

## 2022-03-18 NOTE — Patient Instructions (Addendum)
Medication Instructions:  ?STOP Coreg ? ? ?Re-Start Lopressor 25 mg twice a day ? ? ?Re-Start Spironolactone 12.5 mg daily ? ? ?Labwork: ?None today ? ?Testing/Procedures: ?None today ? ?Follow-Up: ?2 weeks with Ermalinda Barrios ? ?Any Other Special Instructions Will Be Listed Below (If Applicable). ? ?If you need a refill on your cardiac medications before your next appointment, please call your pharmacy. ? ? ?Two Gram Sodium Diet 2000 mg ? ?What is Sodium? Sodium is a mineral found naturally in many foods. The most significant source of sodium in the diet is table salt, which is about 40% sodium.  Processed, convenience, and preserved foods also contain a large amount of sodium.  The body needs only 500 mg of sodium daily to function,  A normal diet provides more than enough sodium even if you do not use salt. ? ?Why Limit Sodium? A build up of sodium in the body can cause thirst, increased blood pressure, shortness of breath, and water retention.  Decreasing sodium in the diet can reduce edema and risk of heart attack or stroke associated with high blood pressure.  Keep in mind that there are many other factors involved in these health problems.  Heredity, obesity, lack of exercise, cigarette smoking, stress and what you eat all play a role. ? ?General Guidelines: ?Do not add salt at the table or in cooking.  One teaspoon of salt contains over 2 grams of sodium. ?Read food labels ?Avoid processed and convenience foods ?Ask your dietitian before eating any foods not dicussed in the menu planning guidelines ?Consult your physician if you wish to use a salt substitute or a sodium containing medication such as antacids.  Limit milk and milk products to 16 oz (2 cups) per day. ? ?Shopping Hints: ?READ LABELS!! "Dietetic" does not necessarily mean low sodium. ?Salt and other sodium ingredients are often added to foods during processing. ? ? ? ?Menu Planning Guidelines ?Food Group Choose More Often Avoid  ?Beverages (see  also the milk group All fruit juices, low-sodium, salt-free vegetables juices, low-sodium carbonated beverages Regular vegetable or tomato juices, commercially softened water used for drinking or cooking  ?Breads and Cereals Enriched white, wheat, rye and pumpernickel bread, hard rolls and dinner rolls; muffins, cornbread and waffles; most dry cereals, cooked cereal without added salt; unsalted crackers and breadsticks; low sodium or homemade bread crumbs Bread, rolls and crackers with salted tops; quick breads; instant hot cereals; pancakes; commercial bread stuffing; self-rising flower and biscuit mixes; regular bread crumbs or cracker crumbs  ?Desserts and Sweets Desserts and sweets mad with mild should be within allowance Instant pudding mixes and cake mixes  ?Fats Butter or margarine; vegetable oils; unsalted salad dressings, regular salad dressings limited to 1 Tbs; light, sour and heavy cream Regular salad dressings containing bacon fat, bacon bits, and salt pork; snack dips made with instant soup mixes or processed cheese; salted nuts  ?Fruits Most fresh, frozen and canned fruits Fruits processed with salt or sodium-containing ingredient (some dried fruits are processed with sodium sulfites  ? ? ? ? ? ? ?Vegetables Fresh, frozen vegetables and low- sodium canned vegetables Regular canned vegetables, sauerkraut, pickled vegetables, and others prepared in brine; frozen vegetables in sauces; vegetables seasoned with ham, bacon or salt pork  ?Condiments, Sauces, Miscellaneous ? Salt substitute with physician's approval; pepper, herbs, spices; vinegar, lemon or lime juice; hot pepper sauce; garlic powder, onion powder, low sodium soy sauce (1 Tbs.); low sodium condiments (ketchup, chili sauce, mustard) in limited amounts (  1 tsp.) fresh ground horseradish; unsalted tortilla chips, pretzels, potato chips, popcorn, salsa (1/4 cup) Any seasoning made with salt including garlic salt, celery salt, onion salt, and  seasoned salt; sea salt, rock salt, kosher salt; meat tenderizers; monosodium glutamate; mustard, regular soy sauce, barbecue, sauce, chili sauce, teriyaki sauce, steak sauce, Worcestershire sauce, and most flavored vinegars; canned gravy and mixes; regular condiments; salted snack foods, olives, picles, relish, horseradish sauce, catsup  ? ?Food preparation: Try these seasonings ?Meats:    ?Pork Sage, onion Serve with applesauce  ?Chicken Poultry seasoning, thyme, parsley Serve with cranberry sauce  ?Lamb Curry powder, rosemary, garlic, thyme Serve with mint sauce or jelly  ?Veal Marjoram, basil Serve with current jelly, cranberry sauce  ?Beef Pepper, bay leaf Serve with dry mustard, unsalted chive butter  ?[place] leaf, dill Serve with unsalted lemon butter, unsalted parsley butter  ?Vegetables:    ?Asparagus Lemon juice   ?Broccoli Lemon juice   ?Carrots Mustard dressing parsley, mint, nutmeg, glazed with unsalted butter and sugar   ?Green beans Marjoram, lemon juice, nutmeg,dill seed   ?Tomatoes Basil, marjoram, onion   ?Spice /blend for "Salt Shaker" 4 tsp ground thyme ?1 tsp ground sage 3 tsp ground rosemary ?4 tsp ground marjoram  ? ?Test your knowledge ?A product that says "Salt Free" may still contain sodium. True or False ?Garlic Powder and Hot Pepper Sauce an be used as alternative seasonings.True or False ?Processed foods have more sodium than fresh foods.  True or False ?Canned Vegetables have less sodium than froze True or False ? ? ?WAYS TO DECREASE YOUR SODIUM INTAKE ?Avoid the use of added salt in cooking and at the table.  Table salt (and other prepared seasonings which contain salt) is probably one of the greatest sources of sodium in the diet.  Unsalted foods can gain flavor from the sweet, sour, and butter taste sensations of herbs and spices.  Instead of using salt for seasoning, try the following seasonings with the foods listed.  Remember: how you use them to enhance natural food flavors is  limited only by your creativity... ?Allspice-Meat, fish, eggs, fruit, peas, red and yellow vegetables ?Almond Extract-Fruit baked goods ?Anise Seed-Sweet breads, fruit, carrots, beets, cottage cheese, cookies (tastes like licorice) ?Basil-Meat, fish, eggs, vegetables, rice, vegetables salads, soups, sauces ?Bay Leaf-Meat, fish, stews, poultry ?Burnet-Salad, vegetables (cucumber-like flavor) ?Caraway Seed-Bread, cookies, cottage cheese, meat, vegetables, cheese, rice ?Cardamon-Baked goods, fruit, soups ?Celery Powder or seed-Salads, salad dressings, sauces, meatloaf, soup, bread.Do not use  celery salt ?Chervil-Meats, salads, fish, eggs, vegetables, cottage cheese (parsley-like flavor) ?Chili Power-Meatloaf, chicken cheese, corn, eggplant, egg dishes ?Chives-Salads cottage cheese, egg dishes, soups, vegetables, sauces ?Cilantro-Salsa, casseroles ?Cinnamon-Baked goods, fruit, pork, lamb, chicken, carrots ?Cloves-Fruit, baked goods, fish, pot roast, green beans, beets, carrots ?Coriander-Pastry, cookies, meat, salads, cheese (lemon-orange flavor) ?Cumin-Meatloaf, fish,cheese, eggs, cabbage,fruit pie (caraway flavor) ?[employer], fruit, eggs, fish, poultry, cottage cheese, vegetables ?Dill Seed-Meat, cottage cheese, poultry, vegetables, fish, salads, bread ?Fennel Seed-Bread, cookies, apples, pork, eggs, fish, beets, cabbage, cheese, Licorice-like flavor ?Garlic-(buds or powder) Salads, meat, poultry, fish, bread, butter, vegetables, potatoes.Do not  use garlic salt ?Ginger-Fruit, vegetables, baked goods, meat, fish, poultry ?Horseradish Root-Meet, vegetables, butter ?Lemon Juice or Extract-Vegetables, fruit, tea, baked goods, fish salads ?Mace-Baked goods fruit, vegetables, fish, poultry (taste like nutmeg) ?Maple Extract-Syrups ?Marjoram-Meat, chicken, fish, vegetables, breads, green salads (taste like Sage) ?Mint-Tea, lamb, sherbet, vegetables, desserts, carrots, cabbage ?Mustard, Dry or Seed-Cheese, eggs,  meats, vegetables, poultry ?Nutmeg-Baked goods, fruit, chicken, eggs, vegetables,  desserts ?Onion Powder-Meat, fish, poultry, vegetables, cheese, eggs, bread, rice salads (Do not use  ? Onion salt) ?Orange Ext

## 2022-04-09 NOTE — Progress Notes (Signed)
Cardiology Office Note    Date:  04/23/2022   ID:  Maria Fry, DOB February 06, 1951, MRN [id number]   PCP:  Renee Rival, [facility]  Cardiologist:  Rozann Lesches, MD   Advanced Practice Provider:  No care team member to display Electrophysiologist:  Thompson Grayer, MD   [phone]   Chief Complaint  Patient presents with   Follow-up    History of Present Illness:  Maria Fry is a 71 y.o. female  with history of paroxysmal atrial fibrillation on Tikosyn and Eliquis, CAD with known occlusion of the distal LAD managed medically, heart failure preserved EF 50 to 55% on echo in 01/2021.  Patient saw Dr. Domenic Polite 01/28/2022 complaining of shortness of breath since COVID-19 infection and recent heart failure exacerbation.  Repeat echo 01/30/2022 showed reduced LVEF 45 to 50% with grade 2 DD and pulmonary hypertension as noted previously.  Dr. Domenic Polite changed her metoprolol to carvedilol 6.25 mg twice daily and continued current dose diuretics but thought she might need higher dose going forward.   I saw the patient 03/18/2022 and patient was complaining of dizziness with carvedilol and had also stopped her spironolactone.  She also has history of vertigo.  She was not orthostatic in the office.  I offered her a trial off Coreg and put her back on metoprolol to see if that would help.  Also asked her to follow-up with PCP for vertigo.  I also resume spironolactone.  Patient comes in for f/u. No more dizziness, dyspnea, cardiac complaints. Walking 4-5 laps around a school ~ 30 min. [place] the zoo with her granddaughter and it was hard on her. Her ankles swelled and she was exhausted. It took her a week to feel better. She never was short of breath.    Past Medical History:  Diagnosis Date   Arthritis    Right knee   Atrial fibrillation (HCC)    CAD (coronary artery disease)    a. 06/07/15 NSTEMI/Cath: Dist LAD 100%, mid LAD 10%. Otw nl cors. Nl EF w/  inferoapical AK.   Essential hypertension    Hip pain    History of cardiomyopathy 04/23/2016   a. 04/2016 Echo: EF 45%, Gr2 DD; b. 11/2019 Echo: EF 45-50%, mod LVH. Gr2 DD. Nl RV fxn. Sev dil LA.    Hyperlipidemia    Hypothyroidism    NSTEMI (non-ST elevated myocardial infarction) ([place]) 06/05/2015   Vitamin D deficiency     Past Surgical History:  Procedure Laterality Date   ABDOMINAL HYSTERECTOMY     partial- one ovary remaining   BIOPSY  09/17/2020   Procedure: BIOPSY;  Surgeon: [name], DO;  [facility];  Service: Endoscopy;;   CARDIAC CATHETERIZATION N/A 06/06/2015   Procedure: Left Heart Cath and Coronary Angiography;  Surgeon: Peter M Martinique, MD;  Location: [facility];  Service: Cardiovascular;  Laterality: N/A;   CARDIOVERSION N/A 09/14/2020   Procedure: CARDIOVERSION;  Surgeon: Satira Sark, MD;  [facility];  Service: Cardiovascular;  Laterality: N/A;   CHOLECYSTECTOMY     COLONOSCOPY     COLONOSCOPY N/A 08/13/2017   Procedure: COLONOSCOPY;  Surgeon: Daneil Dolin, MD;  [facility];  Service: Endoscopy;  Laterality: N/A;  8:30 AM   COLONOSCOPY WITH PROPOFOL N/A 01/18/2021   [name]: Multiple tubular adenomas removed, next colonoscopy in 3 years   ESOPHAGOGASTRODUODENOSCOPY N/A 09/11/2016   Procedure: ESOPHAGOGASTRODUODENOSCOPY (EGD);  Surgeon:  Daneil Dolin, MD;  [facility];  Service: Endoscopy;  Laterality: N/A;  7:45 am - moved to 10/11 @ 10:30   ESOPHAGOGASTRODUODENOSCOPY (EGD) WITH PROPOFOL N/A 09/17/2020   [name]: Diffuse moderate inflammation characterized by erosions and erythema found in the entire stomach.  Biopsies positive for H. pylori.  Patient has not been treated due to drug allergies.   Heel spurs Left    MALONEY DILATION N/A 09/11/2016   Procedure: Venia Minks DILATION;  Surgeon: Daneil Dolin, MD;  [facility];  Service: Endoscopy;  Laterality: N/A;   POLYPECTOMY  08/13/2017    Procedure: POLYPECTOMY;  Surgeon: Daneil Dolin, MD;  [facility];  Service: Endoscopy;;  colon   POLYPECTOMY  01/18/2021   Procedure: POLYPECTOMY;  Surgeon: Daneil Dolin, MD;  [facility];  Service: Endoscopy;;   SHOULDER ARTHROSCOPY Right    TUBAL LIGATION      Current Medications: Current Meds  Medication Sig   acetaminophen (TYLENOL) 500 MG tablet Take 500 mg by mouth every 6 (six) hours as needed for mild pain or moderate pain.   apixaban (ELIQUIS) 5 MG TABS tablet Take 1 tablet (5 mg total) by mouth 2 (two) times daily.   atorvastatin (LIPITOR) 80 MG tablet Take 1 tablet (80 mg total) by mouth at bedtime.   calcium carbonate (OS-CAL - DOSED IN MG OF ELEMENTAL CALCIUM) 1250 (500 Ca) MG tablet Take 500 mg by mouth daily.   Cholecalciferol (VITAMIN D3) 50 MCG (2000 UT) capsule Take 1 capsule (2,000 Units total) by mouth daily.   diclofenac Sodium (VOLTAREN) 1 % GEL Apply 1 application topically 4 (four) times daily as needed (pain).   dofetilide (TIKOSYN) 125 MCG capsule Take 1 capsule (125 mcg total) by mouth 2 (two) times daily.   ferrous sulfate 325 (65 FE) MG EC tablet Take 325 mg by mouth daily.   furosemide (LASIX) 40 MG tablet Take 1 tablet (40 mg total) by mouth 2 (two) times daily.   gabapentin (NEURONTIN) 300 MG capsule Take 1 capsule (300 mg total) by mouth 2 (two) times daily.   levothyroxine (SYNTHROID) 100 MCG tablet Take 1 tablet (100 mcg total) by mouth daily before breakfast.   loratadine (CLARITIN) 10 MG tablet Take 10 mg by mouth daily.   magnesium oxide (MAG-OX) 400 MG tablet Take 0.5 tablets (200 mg total) by mouth daily.   melatonin 3 MG TABS tablet Take 3 mg by mouth at bedtime.   metoprolol tartrate (LOPRESSOR) 25 MG tablet Take 1 tablet (25 mg total) by mouth 2 (two) times daily.   nitroGLYCERIN (NITROSTAT) 0.4 MG SL tablet DISSOLVE ONE TABLET UNDER THE TONGUE EVERY 5 MINUTES AS NEEDED FOR CHEST PAIN.  DO NOT EXCEED A TOTAL OF 3 DOSES  IN 15 MINUTES (Patient taking differently: DISSOLVE ONE TABLET UNDER THE TONGUE EVERY 5 MINUTES AS NEEDED FOR CHEST PAIN.  DO NOT EXCEED A TOTAL OF 3 DOSES IN 15 MINUTES)   ondansetron (ZOFRAN-ODT) 4 MG disintegrating tablet Take 1 tablet (4 mg total) by mouth every 8 (eight) hours as needed for nausea or vomiting.   potassium chloride SA (KLOR-CON) 20 MEQ tablet Take 3 tablets (60 mEq total) by mouth daily. (Patient taking differently: Take 40 mEq by mouth daily.)   ranolazine (RANEXA) 500 MG 12 hr tablet Take 1 tablet (500 mg total) by mouth 2 (two) times daily.   spironolactone (ALDACTONE) 25 MG tablet Take 0.5 tablets (12.5 mg total) by mouth daily.  vitamin C (ASCORBIC ACID) 500 MG tablet Take 500 mg by mouth daily.     Allergies:   Afrin [oxymetazoline], Amoxicillin, Lisinopril, Ranolazine, Vicodin [hydrocodone-acetaminophen], Demerol [meperidine], Flonase [fluticasone], and Lodine [etodolac]   Social History   Socioeconomic History   Marital status: Married    Spouse name: Not on file   Number of children: 3   Years of education: 10   Highest education level: Not on file  Occupational History   Occupation: team leader at [employer] Use   Smoking status: Former    Packs/day: 1.00    Years: 20.00    Pack years: 20.00    Types: Cigarettes    Start date: 12/02/1977    Quit date: 12/02/2002    Years since quitting: 19.4   Smokeless tobacco: Never  Vaping Use   Vaping Use: Never used  Substance and Sexual Activity   Alcohol use: No    Alcohol/week: 0.0 standard drinks   Drug use: No   Sexual activity: Not Currently  Other Topics Concern   Not on file  Social History Narrative   Retired from [employer].   Social Determinants of Health   Financial Resource Strain: Low Risk    Difficulty of Paying Living Expenses: Not hard at all  Food Insecurity: No Food Insecurity   Worried About Charity fundraiser in the Last Year: Never true   [place] in the  Last Year: Never true  Transportation Needs: No Transportation Needs   Lack of Transportation (Medical): No   Lack of Transportation (Non-Medical): No  Physical Activity: Insufficiently Active   Days of Exercise per Week: 5 days   Minutes of Exercise per Session: 10 min  Stress: No Stress Concern Present   Feeling of Stress : Not at all  Social Connections: Moderately Integrated   Frequency of Communication with Friends and Family: More than three times a week   Frequency of Social Gatherings with Friends and Family: More than three times a week   Attends Religious Services: More than 4 times per year   Active Member of [employer] or Organizations: No   Attends Music therapist: Never   Marital Status: Married     Family History:  The patient's  family history includes Arthritis in her daughter; COPD in her daughter; Cancer in her brother; Heart attack in her brother and father; Heart failure in her brother and father; Lupus in her daughter; Stroke in her sister.   ROS:   Please see the history of present illness.    ROS All other systems reviewed and are negative.   PHYSICAL EXAM:   VS:  BP 124/76   Pulse (!) 53   Ht 5' (1.524 m)   Wt 222 lb 9.6 oz (101 kg)   SpO2 96%   BMI 43.47 kg/m   Physical Exam  GEN: Well nourished, well developed, in no acute distress  HEENT: normal  Neck: no JVD, carotid bruits, or masses Cardiac:RRR; no murmurs, rubs, or gallops  Respiratory:  clear to auscultation bilaterally, normal work of breathing GI: soft, nontender, nondistended, + BS Ext: without cyanosis, clubbing, or edema, Good distal pulses bilaterally MS: no deformity or atrophy  Skin: warm and dry, no rash Neuro:  Alert and Oriented x 3, Strength and sensation are intact Psych: euthymic mood, full affect  Wt Readings from Last 3 Encounters:  04/23/22 222 lb 9.6 oz (101 kg)  03/18/22 222 lb (100.7 kg)  03/06/22 223 lb (  101.2 kg)      Studies/Labs Reviewed:   EKG:   EKG is not ordered today.   Recent Labs: 11/09/2021: Magnesium 2.3 01/15/2022: ALT 15; B Natriuretic Peptide 766.0; Hemoglobin 9.8; Platelets 348 01/17/2022: TSH 3.210 03/06/2022: BUN 15; Creatinine, Ser 1.06; Potassium 4.4; Sodium 136   Lipid Panel    Component Value Date/Time   CHOL 123 03/06/2022 1044   TRIG 51 03/06/2022 1044   HDL 48 03/06/2022 1044   CHOLHDL 2.6 03/06/2022 1044   CHOLHDL 2.7 01/03/2020 0948   VLDL 12 06/23/2017 0902   LDLCALC 63 03/06/2022 1044   LDLCALC 63 01/03/2020 0948    Additional studies/ records that were reviewed today include:  2D echo 01/30/2022 IMPRESSIONS     1. Left ventricular ejection fraction, by estimation, is 45 to 50%. The  left ventricle has mildly decreased function. The left ventricle  demonstrates global hypokinesis. The left ventricular internal cavity size  was mildly dilated. There is moderate  concentric left ventricular hypertrophy. Left ventricular diastolic  parameters are consistent with Grade II diastolic dysfunction  (pseudonormalization).   2. Right ventricular systolic function is low normal. The right  ventricular size is normal. There is severely elevated pulmonary artery  systolic pressure. The estimated right ventricular systolic pressure is  06.3 mmHg.   3. The mitral valve is grossly normal. Moderate mitral valve  regurgitation.   4. Tricuspid valve regurgitation is moderate.   5. The aortic valve is tricuspid. Aortic valve regurgitation is mild.  Aortic regurgitation PHT measures 607 msec.   6. The inferior vena cava is normal in size with greater than 50%  respiratory variability, suggesting right atrial pressure of 3 mmHg.   Comparison(s): Prior images reviewed side by side. LVEF mildly reduced and  low normal RV contraction. Moderate mitral regurgitation. Estimated RVSP  severely elevated.   Echocardiogram 02/08/2021:  1. Left ventricular ejection fraction, by estimation, is 50 to 55%. The  left ventricle  has low normal function. The left ventricle has no regional  wall motion abnormalities. The left ventricular internal cavity size was  mildly dilated. There is mild  left ventricular hypertrophy. Left ventricular diastolic parameters are  indeterminate.   2. Right ventricular systolic function is normal. The right ventricular  size is normal.   3. Left atrial size was severely dilated.   4. Right atrial size was moderately dilated.   5. The mitral valve is abnormal. Mild to moderate mitral valve  regurgitation. No evidence of mitral stenosis.   6. The aortic valve is tricuspid. There is mild calcification of the  aortic valve. There is mild thickening of the aortic valve. Aortic valve  regurgitation is mild. No aortic stenosis is present.   7. Moderate pulmonary hypertension, PASP is 52 mmHg.   8. The inferior vena cava is normal in size with greater than 50%  respiratory variability, suggesting right atrial pressure of 3 mmHg.    Chest CTA 01/15/2022: IMPRESSION: 1. Marked cardiomegaly, with scattered ground-glass airspace disease and trace effusions consistent with mild congestive heart failure. 2. No evidence of pulmonary embolus. 3.  Aortic Atherosclerosis (ICD10-I70.0).       Risk Assessment/Calculations:    CHA2DS2-VASc Score = 5   This indicates a 7.2% annual risk of stroke. The patient's score is based upon: CHF History: 1 HTN History: 1 Diabetes History: 0 Stroke History: 0 Vascular Disease History: 1 Age Score: 1 Gender Score: 1        ASSESSMENT:    1. Chronic  combined systolic and diastolic CHF (congestive heart failure) (HCC)   2. Paroxysmal atrial fibrillation ([place])   3. Coronary artery disease involving native coronary artery of native heart without angina pectoris   4. Class 3 severe obesity due to excess calories with serious comorbidity and body mass index (BMI) of 40.0 to 44.9 in adult [facility])      PLAN:  In order of problems listed  above:  Chronic combined systolic and diastolic CHF EF 45 to 41% on echo 01/30/2022 with grade 2 DD and pulmonary hypertension.  Metoprolol changed to carvedilol but patient complained of dizziness on it and reduced to once daily which didn't help. She also stopped her spironolactone.  Was Not orthostatic in the office that day. I put her back on metoprolol and  Also resumed spironolactone. She is remarkably better. Continue current meds.   Paroxysmal atrial fibrillation on Tikosyn and Eliquis followed in Afib clinic   CAD with known occlusion of the distal LAD managed medically-no angina on ranexa  Obesity-exercise and weight loss discussed  Shared Decision Making/Informed Consent        Medication Adjustments/Labs and Tests Ordered: Current medicines are reviewed at length with the patient today.  Concerns regarding medicines are outlined above.  Medication changes, Labs and Tests ordered today are listed in the Patient Instructions below. Patient Instructions  Medication Instructions:  Your physician recommends that you continue on your current medications as directed. Please refer to the Current Medication list given to you today.  *If you need a refill on your cardiac medications before your next appointment, please call your pharmacy*   Lab Work: NONE   If you have labs (blood work) drawn today and your tests are completely normal, you will receive your results only by: [place] (if you have MyChart) OR A paper copy in the mail If you have any lab test that is abnormal or we need to change your treatment, we will call you to review the results.   Testing/Procedures: NONE   Follow-Up: At [facility], you and your health needs are our priority.  As part of our continuing mission to provide you with exceptional heart care, we have created designated Provider Care Teams.  These Care Teams include your primary Cardiologist (physician) and Advanced Practice Providers (APPs  -  Physician Assistants and Nurse Practitioners) who all work together to provide you with the care you need, when you need it.  We recommend signing up for the patient portal called "MyChart".  Sign up information is provided on this After Visit Summary.  MyChart is used to connect with patients for Virtual Visits (Telemedicine).  Patients are able to view lab/test results, encounter notes, upcoming appointments, etc.  Non-urgent messages can be sent to your provider as well.   To learn more about what you can do with MyChart, go to [email or web address].    Your next appointment:   4-6  month(s)  The format for your next appointment:   In Person  Provider:   Rozann Lesches, MD    Other Instructions Thank you for choosing [facility]!    Important Information About Sugar         Sumner Boast, PA-C  04/23/2022 1:11 PM    [facility] [address], [place], [place]  [address] Phone: [phone]; Fax: [phone]

## 2022-04-23 ENCOUNTER — Encounter: Payer: Self-pay | Admitting: Physician Assistant

## 2022-04-23 ENCOUNTER — Ambulatory Visit: Payer: Medicare Other | Admitting: Physician Assistant

## 2022-04-23 VITALS — BP 124/76 | HR 53 | Ht 60.0 in | Wt 222.6 lb

## 2022-04-23 DIAGNOSIS — I2510 Atherosclerotic heart disease of native coronary artery without angina pectoris: Secondary | ICD-10-CM

## 2022-04-23 DIAGNOSIS — I480 Paroxysmal atrial fibrillation: Secondary | ICD-10-CM

## 2022-04-23 DIAGNOSIS — I5042 Chronic combined systolic (congestive) and diastolic (congestive) heart failure: Secondary | ICD-10-CM

## 2022-04-23 DIAGNOSIS — Z6841 Body Mass Index (BMI) 40.0 and over, adult: Secondary | ICD-10-CM

## 2022-04-23 NOTE — Patient Instructions (Signed)
Medication Instructions:  Your physician recommends that you continue on your current medications as directed. Please refer to the Current Medication list given to you today.  *If you need a refill on your cardiac medications before your next appointment, please call your pharmacy*   Lab Work: NONE   If you have labs (blood work) drawn today and your tests are completely normal, you will receive your results only by: [place] (if you have MyChart) OR A paper copy in the mail If you have any lab test that is abnormal or we need to change your treatment, we will call you to review the results.   Testing/Procedures: NONE   Follow-Up: At [facility], you and your health needs are our priority.  As part of our continuing mission to provide you with exceptional heart care, we have created designated Provider Care Teams.  These Care Teams include your primary Cardiologist (physician) and Advanced Practice Providers (APPs -  Physician Assistants and Nurse Practitioners) who all work together to provide you with the care you need, when you need it.  We recommend signing up for the patient portal called "MyChart".  Sign up information is provided on this After Visit Summary.  MyChart is used to connect with patients for Virtual Visits (Telemedicine).  Patients are able to view lab/test results, encounter notes, upcoming appointments, etc.  Non-urgent messages can be sent to your provider as well.   To learn more about what you can do with MyChart, go to [email or web address].    Your next appointment:   4-6  month(s)  The format for your next appointment:   In Person  Provider:   Rozann Lesches, MD    Other Instructions Thank you for choosing [facility]!    Important Information About Sugar

## 2022-04-25 ENCOUNTER — Other Ambulatory Visit: Payer: Self-pay

## 2022-04-25 DIAGNOSIS — E785 Hyperlipidemia, unspecified: Secondary | ICD-10-CM

## 2022-04-25 MED ORDER — ATORVASTATIN CALCIUM 80 MG PO TABS: 80.0000 mg | ORAL_TABLET | Freq: Every day | ORAL | 3 refills | Status: DC

## 2022-05-06 ENCOUNTER — Other Ambulatory Visit: Payer: Self-pay | Admitting: Physician Assistant

## 2022-05-10 ENCOUNTER — Ambulatory Visit (HOSPITAL_COMMUNITY)
Admission: RE | Admit: 2022-05-10 | Discharge: 2022-05-10 | Disposition: A | Discharge status: Home / Self Care | Payer: Medicare Other | Source: Ambulatory Visit | Attending: Physician Assistant | Admitting: Physician Assistant

## 2022-05-10 ENCOUNTER — Encounter (HOSPITAL_COMMUNITY): Payer: Self-pay | Admitting: Physician Assistant

## 2022-05-10 VITALS — BP 128/80 | HR 55 | Ht 60.0 in | Wt 222.0 lb

## 2022-05-10 DIAGNOSIS — I4819 Other persistent atrial fibrillation: Secondary | ICD-10-CM | POA: Insufficient documentation

## 2022-05-10 DIAGNOSIS — G4733 Obstructive sleep apnea (adult) (pediatric): Secondary | ICD-10-CM | POA: Insufficient documentation

## 2022-05-10 DIAGNOSIS — I252 Old myocardial infarction: Secondary | ICD-10-CM | POA: Insufficient documentation

## 2022-05-10 DIAGNOSIS — I110 Hypertensive heart disease with heart failure: Secondary | ICD-10-CM | POA: Diagnosis not present

## 2022-05-10 DIAGNOSIS — M7989 Other specified soft tissue disorders: Secondary | ICD-10-CM | POA: Insufficient documentation

## 2022-05-10 DIAGNOSIS — D6869 Other thrombophilia: Secondary | ICD-10-CM | POA: Diagnosis not present

## 2022-05-10 DIAGNOSIS — Z7901 Long term (current) use of anticoagulants: Secondary | ICD-10-CM | POA: Diagnosis not present

## 2022-05-10 DIAGNOSIS — I5042 Chronic combined systolic (congestive) and diastolic (congestive) heart failure: Secondary | ICD-10-CM | POA: Insufficient documentation

## 2022-05-10 DIAGNOSIS — Z79899 Other long term (current) drug therapy: Secondary | ICD-10-CM | POA: Insufficient documentation

## 2022-05-10 DIAGNOSIS — E669 Obesity, unspecified: Secondary | ICD-10-CM | POA: Insufficient documentation

## 2022-05-10 DIAGNOSIS — Z6841 Body Mass Index (BMI) 40.0 and over, adult: Secondary | ICD-10-CM | POA: Insufficient documentation

## 2022-05-10 DIAGNOSIS — D6859 Other primary thrombophilia: Secondary | ICD-10-CM | POA: Diagnosis not present

## 2022-05-10 DIAGNOSIS — I2510 Atherosclerotic heart disease of native coronary artery without angina pectoris: Secondary | ICD-10-CM | POA: Insufficient documentation

## 2022-05-10 LAB — MAGNESIUM: Magnesium: 2.3 mg/dL (ref 1.7–2.4)

## 2022-05-10 NOTE — Progress Notes (Signed)
Primary Care Physician: Renee Rival, FNP Primary Cardiologist: Dr Domenic Polite  Primary Electrophysiologist: Dr Rayann Heman Referring Physician: Levell July   Maria Fry is a 71 y.o. female with a history of CAD, chronic combined systolic and diastolic CHF, HLD, HTN, OSA, hypothyroidism, and persistent atrial fibrillation who presents for follow up in the [facility]. The patient was initially diagnosed with atrial fibrillation 08/18/20 at a routine follow up with Rosaria Ferries. She was having symptoms of increased fatigue. She was started on Eliquis for a CHADS2VASC score of 5. She underwent DCCV on 09/14/20. She was hospitalized 10/15-10/17/21 for acute CHF and black stools. Workup revealed gastritis and her Eliquis was temporarily held. She resumed 09/17/20. Her ASA was discontinued and she has not had recurrence of bleeding. She was back in afib on follow up 10/02/20. She reports that she did feel better in SR with more energy and less SOB.  Pt is s/p dofetilide loading 11/9-11/14/21. She converted to sinus rhythm after the first dose however, she was noted to have significant widening of QTC out to 551 ms. Dofetilide was held for 24 hours and subsequently resumed at 125 mcg twice daily after QT normalized.    On follow up today, patient reports that she has done well since her last visit with no tachypalpitations. She does have some lower extremity swelling that is better with leg elevation and worse with standing. No bleeding issues on anticoagulation.   Today, she denies symptoms of palpitations, SOB, chest pain, orthopnea, PND, dizziness, presyncope, syncope, bleeding, or neurologic sequela. The patient is tolerating medications without difficulties and is otherwise without complaint today.    Atrial Fibrillation Risk Factors:  she does have symptoms or diagnosis of sleep apnea. Patient reports compliance with CPAP. she does not have a history of rheumatic  fever.   she has a BMI of Body mass index is 43.36 kg/m.Marland Kitchen Filed Weights   05/10/22 0945  Weight: 100.7 kg     Family History  Problem Relation Age of Onset   Heart failure Father        Deceased   Heart attack Father        Deceased   Stroke Sister        Deceased   Heart failure Brother        Deceased   Cancer Brother        Deceased   Heart attack Brother        Deceased   COPD Daughter    Arthritis Daughter    Lupus Daughter    Colon cancer Neg Hx      Atrial Fibrillation Management history:  Previous antiarrhythmic drugs: dofetilide  Previous cardioversions: 09/14/20 Previous ablations: none CHADS2VASC score: 5 Anticoagulation history: Eliquis   Past Medical History:  Diagnosis Date   Arthritis    Right knee   Atrial fibrillation (HCC)    CAD (coronary artery disease)    a. 06/07/15 NSTEMI/Cath: Dist LAD 100%, mid LAD 10%. Otw nl cors. Nl EF w/ inferoapical AK.   Essential hypertension    Hip pain    History of cardiomyopathy 04/23/2016   a. 04/2016 Echo: EF 45%, Gr2 DD; b. 11/2019 Echo: EF 45-50%, mod LVH. Gr2 DD. Nl RV fxn. Sev dil LA.    Hyperlipidemia    Hypothyroidism    NSTEMI (non-ST elevated myocardial infarction) ([place]) 06/05/2015   Vitamin D deficiency    Past Surgical History:  Procedure Laterality Date   ABDOMINAL HYSTERECTOMY  partial- one ovary remaining   BIOPSY  09/17/2020   Procedure: BIOPSY;  Surgeon: [name], DO;  [facility];  Service: Endoscopy;;   CARDIAC CATHETERIZATION N/A 06/06/2015   Procedure: Left Heart Cath and Coronary Angiography;  Surgeon: Peter M Martinique, MD;  Location: [facility];  Service: Cardiovascular;  Laterality: N/A;   CARDIOVERSION N/A 09/14/2020   Procedure: CARDIOVERSION;  Surgeon: Satira Sark, MD;  [facility];  Service: Cardiovascular;  Laterality: N/A;   CHOLECYSTECTOMY     COLONOSCOPY     COLONOSCOPY N/A 08/13/2017   Procedure: COLONOSCOPY;  Surgeon:  Daneil Dolin, MD;  [facility];  Service: Endoscopy;  Laterality: N/A;  8:30 AM   COLONOSCOPY WITH PROPOFOL N/A 01/18/2021   [name]: Multiple tubular adenomas removed, next colonoscopy in 3 years   ESOPHAGOGASTRODUODENOSCOPY N/A 09/11/2016   Procedure: ESOPHAGOGASTRODUODENOSCOPY (EGD);  Surgeon: Daneil Dolin, MD;  [facility];  Service: Endoscopy;  Laterality: N/A;  7:45 am - moved to 10/11 @ 10:30   ESOPHAGOGASTRODUODENOSCOPY (EGD) WITH PROPOFOL N/A 09/17/2020   [name]: Diffuse moderate inflammation characterized by erosions and erythema found in the entire stomach.  Biopsies positive for H. pylori.  Patient has not been treated due to drug allergies.   Heel spurs Left    MALONEY DILATION N/A 09/11/2016   Procedure: Venia Minks DILATION;  Surgeon: Daneil Dolin, MD;  [facility];  Service: Endoscopy;  Laterality: N/A;   POLYPECTOMY  08/13/2017   Procedure: POLYPECTOMY;  Surgeon: Daneil Dolin, MD;  [facility];  Service: Endoscopy;;  colon   POLYPECTOMY  01/18/2021   Procedure: POLYPECTOMY;  Surgeon: Daneil Dolin, MD;  [facility];  Service: Endoscopy;;   SHOULDER ARTHROSCOPY Right    TUBAL LIGATION      Current Outpatient Medications  Medication Sig Dispense Refill   acetaminophen (TYLENOL) 500 MG tablet Take 500 mg by mouth every 6 (six) hours as needed for mild pain or moderate pain.     apixaban (ELIQUIS) 5 MG TABS tablet Take 1 tablet (5 mg total) by mouth 2 (two) times daily. 60 tablet 11   atorvastatin (LIPITOR) 80 MG tablet Take 1 tablet (80 mg total) by mouth at bedtime. 90 tablet 3   calcium carbonate (OS-CAL - DOSED IN MG OF ELEMENTAL CALCIUM) 1250 (500 Ca) MG tablet Take 500 mg by mouth daily.     Cholecalciferol (VITAMIN D3) 50 MCG (2000 UT) capsule Take 1 capsule (2,000 Units total) by mouth daily. 30 capsule 3   diclofenac Sodium (VOLTAREN) 1 % GEL Apply 1 application topically 4 (four) times daily as needed  (pain).     dofetilide (TIKOSYN) 125 MCG capsule Take 1 capsule (125 mcg total) by mouth 2 (two) times daily. 180 capsule 1   ferrous sulfate 325 (65 FE) MG EC tablet Take 325 mg by mouth daily.     furosemide (LASIX) 40 MG tablet Take 1 tablet (40 mg total) by mouth 2 (two) times daily. 180 tablet 1   gabapentin (NEURONTIN) 300 MG capsule Take 1 capsule (300 mg total) by mouth 2 (two) times daily. 60 capsule 2   levothyroxine (SYNTHROID) 100 MCG tablet Take 1 tablet (100 mcg total) by mouth daily before breakfast. 90 tablet 1   loratadine (CLARITIN) 10 MG tablet Take 10 mg by mouth daily.     magnesium oxide (MAG-OX) 400 MG tablet Take 0.5 tablets (200 mg total) by mouth  daily. 30 tablet 6   MAGnesium-Oxide 400 (240 Mg) MG tablet Take 0.5 tablets (200 mg total) by mouth daily. 30 tablet 6   melatonin 3 MG TABS tablet Take 3 mg by mouth at bedtime.     metoprolol tartrate (LOPRESSOR) 25 MG tablet Take 1 tablet (25 mg total) by mouth 2 (two) times daily. 60 tablet 6   nitroGLYCERIN (NITROSTAT) 0.4 MG SL tablet DISSOLVE ONE TABLET UNDER THE TONGUE EVERY 5 MINUTES AS NEEDED FOR CHEST PAIN.  DO NOT EXCEED A TOTAL OF 3 DOSES IN 15 MINUTES (Patient taking differently: DISSOLVE ONE TABLET UNDER THE TONGUE EVERY 5 MINUTES AS NEEDED FOR CHEST PAIN.  DO NOT EXCEED A TOTAL OF 3 DOSES IN 15 MINUTES) 25 tablet 3   ondansetron (ZOFRAN-ODT) 4 MG disintegrating tablet Take 1 tablet (4 mg total) by mouth every 8 (eight) hours as needed for nausea or vomiting. 20 tablet 0   potassium chloride SA (KLOR-CON) 20 MEQ tablet Take 3 tablets (60 mEq total) by mouth daily. (Patient taking differently: Take 40 mEq by mouth daily.) 90 tablet 1   promethazine-dextromethorphan (PROMETHAZINE-DM) 6.25-15 MG/5ML syrup Take 5 mLs by mouth 4 (four) times daily as needed. 100 mL 0   ranolazine (RANEXA) 500 MG 12 hr tablet Take 1 tablet (500 mg total) by mouth 2 (two) times daily. 60 tablet 6   spironolactone (ALDACTONE) 25 MG tablet  Take 0.5 tablets (12.5 mg total) by mouth daily. 45 tablet 1   vitamin C (ASCORBIC ACID) 500 MG tablet Take 500 mg by mouth daily.     No current facility-administered medications for this encounter.    Allergies  Allergen Reactions   Afrin [Oxymetazoline] Anaphylaxis   Amoxicillin Anaphylaxis   Lisinopril Anaphylaxis   Ranolazine Anaphylaxis    Dizzy, Reaction occurred with generic med   Vicodin [Hydrocodone-Acetaminophen] Diarrhea and Nausea And Vomiting   Demerol [Meperidine] Nausea And Vomiting   Flonase [Fluticasone]     anaphylaxis   Lodine [Etodolac] Rash    Social History   Socioeconomic History   Marital status: Married    Spouse name: Not on file   Number of children: 3   Years of education: 10   Highest education level: Not on file  Occupational History   Occupation: team leader at [employer]  Tobacco Use   Smoking status: Former    Packs/day: 1.00    Years: 20.00    Total pack years: 20.00    Types: Cigarettes    Start date: 12/02/1977    Quit date: 12/02/2002    Years since quitting: 19.4   Smokeless tobacco: Never   Tobacco comments:    Former smoker 05/10/22  Vaping Use   Vaping Use: Never used  Substance and Sexual Activity   Alcohol use: No    Alcohol/week: 0.0 standard drinks of alcohol   Drug use: No   Sexual activity: Not Currently  Other Topics Concern   Not on file  Social History Narrative   Retired from [employer].   Social Determinants of Health   Financial Resource Strain: Low Risk  (11/28/2021)   Overall Financial Resource Strain (CARDIA)    Difficulty of Paying Living Expenses: Not hard at all  Food Insecurity: No Food Insecurity (11/28/2021)   Hunger Vital Sign    Worried About Running Out of Food in the Last Year: Never true    Ran Out of Food in the Last Year: Never true  Transportation Needs: No Transportation Needs (11/28/2021)   PRAPARE -  Hydrologist (Medical): No    Lack of  Transportation (Non-Medical): No  Physical Activity: Insufficiently Active (11/28/2021)   Exercise Vital Sign    Days of Exercise per Week: 5 days    Minutes of Exercise per Session: 10 min  Stress: No Stress Concern Present (11/28/2021)   [place]    Feeling of Stress : Not at all  Social Connections: Moderately Integrated (11/28/2021)   Social Connection and Isolation Panel [NHANES]    Frequency of Communication with Friends and Family: More than three times a week    Frequency of Social Gatherings with Friends and Family: More than three times a week    Attends Religious Services: More than 4 times per year    Active Member of [employer] or Organizations: No    Attends Archivist Meetings: Never    Marital Status: Married  Human resources officer Violence: Not At Risk (11/28/2021)   Humiliation, Afraid, Rape, and Kick questionnaire    Fear of Current or Ex-Partner: No    Emotionally Abused: No    Physically Abused: No    Sexually Abused: No     ROS- All systems are reviewed and negative except as per the HPI above.  Physical Exam: Vitals:   05/10/22 0945  BP: 128/80  Pulse: (!) 55  Weight: 100.7 kg  Height: 5' (1.524 m)     GEN- The patient is a well appearing obese female, alert and oriented x 3 today.   HEENT-head normocephalic, atraumatic, sclera clear, conjunctiva pink, hearing intact, trachea midline. Lungs- Clear to ausculation bilaterally, normal work of breathing Heart- Regular rate and rhythm, no murmurs, rubs or gallops  GI- soft, NT, ND, + BS Extremities- no clubbing, cyanosis, trace bilateral edema MS- no significant deformity or atrophy Skin- no rash or lesion Psych- euthymic mood, full affect Neuro- strength and sensation are intact   Wt Readings from Last 3 Encounters:  05/10/22 100.7 kg  04/23/22 101 kg  03/18/22 100.7 kg    EKG today demonstrates  SB, NST Vent. rate 55  BPM PR interval 180 ms QRS duration 102 ms QT/QTcB 466/445 ms  Echo 02/08/21 demonstrated   1. Left ventricular ejection fraction, by estimation, is 50 to 55%. The  left ventricle has low normal function. The left ventricle has no regional  wall motion abnormalities. The left ventricular internal cavity size was  mildly dilated. There is mild left ventricular hypertrophy. Left ventricular diastolic parameters are indeterminate.   2. Right ventricular systolic function is normal. The right ventricular  size is normal.   3. Left atrial size was severely dilated.   4. Right atrial size was moderately dilated.   5. The mitral valve is abnormal. Mild to moderate mitral valve  regurgitation. No evidence of mitral stenosis.   6. The aortic valve is tricuspid. There is mild calcification of the  aortic valve. There is mild thickening of the aortic valve. Aortic valve  regurgitation is mild. No aortic stenosis is present.   7. Moderate pulmonary hypertension, PASP is 52 mmHg.   8. The inferior vena cava is normal in size with greater than 50%  respiratory variability, suggesting right atrial pressure of 3 mmHg.   Comparison(s): Previous Echo showed LV EF 40-45%, global hypokinesis, severe LVH, severe bi-atrial enlargement, moderate TR, mild to moderate MR, PASP 50 mmHg.   Epic records are reviewed at length today  CHA2DS2-VASc Score = 5  The  patient's score is based upon: CHF History: 1 HTN History: 1 Diabetes History: 0 Stroke History: 0 Vascular Disease History: 1 Age Score: 1 Gender Score: 1       ASSESSMENT AND PLAN: 1. Persistent Atrial Fibrillation (ICD10:  I48.19) The patient's CHA2DS2-VASc score is 5, indicating a 7.2% annual risk of stroke.   S/p dofetilide loading 11/9-11/14/21 Patient appears to be maintaining SR. Continue dofetilide 125 mcg BID. QT stable.  Check magnesium today, recent Bmet reviewed.  Continue Eliquis 5 mg BID Continue Lopressor 25 mg BID  2.  Secondary Hypercoagulable State (ICD10:  D68.69) The patient is at significant risk for stroke/thromboembolism based upon her CHA2DS2-VASc Score of 5.  Continue Apixaban (Eliquis).   3. Obesity Body mass index is 43.36 kg/m. Lifestyle modification was discussed and encouraged including regular physical activity and weight reduction.  4. OSA Encouraged compliance with CPAP therapy.   5. Chronic combined CHF Appears euvolemic today. Encouraged leg elevation and compression stockings.   6. CAD Should not have up titration of Ranexa 2/2 QT. No anginal symptoms.  7. HTN Stable, no changes today.   Follow up in the AF clinic in 6 months.   [facility] [address] [place], [place] [address] [phone]

## 2022-06-04 ENCOUNTER — Other Ambulatory Visit: Payer: Self-pay | Admitting: Cardiology

## 2022-06-17 ENCOUNTER — Telehealth: Payer: Self-pay | Admitting: Cardiology

## 2022-06-17 MED ORDER — ISOSORBIDE MONONITRATE ER 30 MG PO TB24: 30.0000 mg | ORAL_TABLET | Freq: Every day | ORAL | 1 refills | Status: DC

## 2022-06-17 NOTE — Telephone Encounter (Signed)
It looks as though brand name Ranexa is no longer being manufactured but generic ranolazine is still available. She has dizziness listed as as side effect with this. Should not be any clinical differences between brand and generic Ranexa, would encourage her to try generic formulation again.

## 2022-06-17 NOTE — Telephone Encounter (Signed)
Pt c/o of Chest Pain: STAT if CP now or developed within 24 hours  1. Are you having CP right now? No  2. Are you experiencing any other symptoms (ex. SOB, nausea, vomiting, sweating)? Started on 07/14  3. How long have you been experiencing CP? 3 days  4. Is your CP continuous or coming and going? Comes and hurts really bad and then it eases up and comes back.   5. Have you taken Nitroglycerin? Yes.   Pt c/o Shortness Of Breath: STAT if SOB developed within the last 24 hours or pt is noticeably SOB on the phone  1. Are you currently SOB (can you hear that pt is SOB on the phone)? Yes  2. How long have you been experiencing SOB? Since 07/14  3. Are you SOB when sitting or when up moving around? When she is on her back. Pressure on chest when on back.   4. Are you currently experiencing any other symptoms? Exhaustion and CP.    She states she has been out of her medication of Ranexa for 4 days and her pharmacy states they will no longer carry it. Only the generic version and she states she is allergic to that.  ?

## 2022-06-17 NOTE — Telephone Encounter (Signed)
Patient made aware and verbalized understanding. states that she will try the imdur or isosorbide.

## 2022-06-17 NOTE — Telephone Encounter (Signed)
Patient states that the generic form of Ranexa causes her throat to swell. Imdur 30 mg once a day sent to [facility] in [place]. Patient made aware.

## 2022-06-17 NOTE — Telephone Encounter (Signed)
Patient states that she has been having intermittent chest pain also with pain feeling like it is behind her left breast and shoulder since Friday evening, once she ran out of her Ranexa. States that Friday she started having sever angina pain and fatigue and seems to have SOB with doing something simple as walking. Denies chest pain right at the moment and does not have any dizziness. States that she has been taking nitro which helps with the pains and usually last until the next day. BP today was 121/70 P 75. States that she called [facility]  and was told that they are no longer carrying Renexa and that this medication will no longer be distributed to any pharmacies in the [place]. I called [facility] and they are no longer carrying this medication. Patient advised that if she starts to have new or worsening symptoms, to call 911 or go to the ER. Please advise

## 2022-06-17 NOTE — Telephone Encounter (Signed)
Allergy list has been updated.

## 2022-06-18 ENCOUNTER — Encounter (HOSPITAL_COMMUNITY): Payer: Self-pay

## 2022-06-18 ENCOUNTER — Telehealth: Payer: Self-pay | Admitting: Cardiology

## 2022-06-18 ENCOUNTER — Other Ambulatory Visit: Payer: Self-pay

## 2022-06-18 ENCOUNTER — Emergency Department (HOSPITAL_COMMUNITY): Payer: Medicare Other

## 2022-06-18 ENCOUNTER — Observation Stay (HOSPITAL_COMMUNITY)
Admission: EM | Admit: 2022-06-18 | Discharge: 2022-06-21 | Disposition: A | Discharge status: Home / Self Care | Payer: Medicare Other | Attending: Internal Medicine | Admitting: Internal Medicine

## 2022-06-18 DIAGNOSIS — Z79899 Other long term (current) drug therapy: Secondary | ICD-10-CM | POA: Insufficient documentation

## 2022-06-18 DIAGNOSIS — R002 Palpitations: Secondary | ICD-10-CM | POA: Diagnosis present

## 2022-06-18 DIAGNOSIS — I25112 Atherosclerotic heart disease of native coronary artery with refractory angina pectoris: Principal | ICD-10-CM | POA: Insufficient documentation

## 2022-06-18 DIAGNOSIS — I200 Unstable angina: Secondary | ICD-10-CM | POA: Diagnosis not present

## 2022-06-18 DIAGNOSIS — E782 Mixed hyperlipidemia: Secondary | ICD-10-CM

## 2022-06-18 DIAGNOSIS — R7989 Other specified abnormal findings of blood chemistry: Secondary | ICD-10-CM | POA: Diagnosis not present

## 2022-06-18 DIAGNOSIS — D509 Iron deficiency anemia, unspecified: Secondary | ICD-10-CM

## 2022-06-18 DIAGNOSIS — K5900 Constipation, unspecified: Secondary | ICD-10-CM | POA: Diagnosis present

## 2022-06-18 DIAGNOSIS — I2510 Atherosclerotic heart disease of native coronary artery without angina pectoris: Secondary | ICD-10-CM | POA: Diagnosis present

## 2022-06-18 DIAGNOSIS — E039 Hypothyroidism, unspecified: Secondary | ICD-10-CM

## 2022-06-18 DIAGNOSIS — I5042 Chronic combined systolic (congestive) and diastolic (congestive) heart failure: Secondary | ICD-10-CM | POA: Diagnosis present

## 2022-06-18 DIAGNOSIS — Z86718 Personal history of other venous thrombosis and embolism: Secondary | ICD-10-CM | POA: Insufficient documentation

## 2022-06-18 DIAGNOSIS — G4700 Insomnia, unspecified: Secondary | ICD-10-CM

## 2022-06-18 DIAGNOSIS — R2689 Other abnormalities of gait and mobility: Secondary | ICD-10-CM | POA: Insufficient documentation

## 2022-06-18 DIAGNOSIS — I110 Hypertensive heart disease with heart failure: Secondary | ICD-10-CM | POA: Diagnosis not present

## 2022-06-18 DIAGNOSIS — I482 Chronic atrial fibrillation, unspecified: Secondary | ICD-10-CM

## 2022-06-18 DIAGNOSIS — I10 Essential (primary) hypertension: Secondary | ICD-10-CM

## 2022-06-18 DIAGNOSIS — R079 Chest pain, unspecified: Secondary | ICD-10-CM | POA: Diagnosis present

## 2022-06-18 DIAGNOSIS — Z87891 Personal history of nicotine dependence: Secondary | ICD-10-CM | POA: Diagnosis not present

## 2022-06-18 DIAGNOSIS — E785 Hyperlipidemia, unspecified: Secondary | ICD-10-CM

## 2022-06-18 DIAGNOSIS — I202 Refractory angina pectoris: Secondary | ICD-10-CM

## 2022-06-18 DIAGNOSIS — Z7901 Long term (current) use of anticoagulants: Secondary | ICD-10-CM | POA: Insufficient documentation

## 2022-06-18 LAB — BASIC METABOLIC PANEL
Anion gap: 7 (ref 5–15)
BUN: 18 mg/dL (ref 8–23)
CO2: 26 mmol/L (ref 22–32)
Calcium: 8.5 mg/dL — ABNORMAL LOW (ref 8.9–10.3)
Chloride: 104 mmol/L (ref 98–111)
Creatinine, Ser: 1.26 mg/dL — ABNORMAL HIGH (ref 0.44–1.00)
GFR, Estimated: 46 mL/min — ABNORMAL LOW (ref >60)
Glucose, Bld: 117 mg/dL — ABNORMAL HIGH (ref 70–99)
Potassium: 4.7 mmol/L (ref 3.5–5.1)
Sodium: 137 mmol/L (ref 135–145)

## 2022-06-18 LAB — CBC
HCT: 30 % — ABNORMAL LOW (ref 36.0–46.0)
Hemoglobin: 9.2 g/dL — ABNORMAL LOW (ref 12.0–15.0)
MCH: 23.6 pg — ABNORMAL LOW (ref 26.0–34.0)
MCHC: 30.7 g/dL (ref 30.0–36.0)
MCV: 76.9 fL — ABNORMAL LOW (ref 80.0–100.0)
Platelets: 443 10*3/uL — ABNORMAL HIGH (ref 150–400)
RBC: 3.9 MIL/uL (ref 3.87–5.11)
RDW: 22.3 % — ABNORMAL HIGH (ref 11.5–15.5)
WBC: 9.3 10*3/uL (ref 4.0–10.5)
nRBC: 0 % (ref 0.0–0.2)

## 2022-06-18 LAB — BRAIN NATRIURETIC PEPTIDE: B Natriuretic Peptide: 305 pg/mL — ABNORMAL HIGH (ref 0.0–100.0)

## 2022-06-18 LAB — TROPONIN I (HIGH SENSITIVITY)
Troponin I (High Sensitivity): 10 ng/L (ref <18)
Troponin I (High Sensitivity): 8 ng/L (ref <18)

## 2022-06-18 NOTE — ED Provider Notes (Signed)
[place] Provider Note   CSN: [id number] Arrival date & time: 06/18/22  1703     History  Chief Complaint  Patient presents with   Palpitations    Maria Fry is a 71 y.o. female.  HPI     71 year old female comes in with chief complaint of palpitations. Patient has history of CAD that is being medically managed, stable angina for which she is on Ranexa and CHF, A-fib.   Patient states that she could not refill her Ranexa last week because the pharmacy is out of it.  Subsequently, she has been having chest pain.  She has chest pain that is described as midsternal, pressure type pain with pain between her shoulder blades as well.  She has shortness of breath with exertion and has reduced exercise tolerance.  Pain is similar to her anginal pain.  Her cardiologist started on Imdur yesterday, but the pain is continued and worsened today.  Pt has no hx of PE, DVT and denies any exogenous hormone (testosterone / estrogen) use, long distance travels or surgery in the past 6 weeks, active cancer, recent immobilization.   Home Medications Prior to Admission medications   Medication Sig Start Date End Date Taking? Authorizing Provider  acetaminophen (TYLENOL) 500 MG tablet Take 500 mg by mouth every 6 (six) hours as needed for mild pain or moderate pain.   Yes [provider]  apixaban (ELIQUIS) 5 MG TABS tablet Take 1 tablet (5 mg total) by mouth 2 (two) times daily. 09/12/21  Yes Fenton, Clint R, PA  atorvastatin (LIPITOR) 80 MG tablet Take 1 tablet (80 mg total) by mouth at bedtime. 04/25/22  Yes Paseda, Dewaine Conger, FNP  calcium carbonate (OS-CAL - DOSED IN MG OF ELEMENTAL CALCIUM) 1250 (500 Ca) MG tablet Take 500 mg by mouth daily.   Yes [provider]  Cholecalciferol (VITAMIN D3) 50 MCG (2000 UT) capsule Take 1 capsule (2,000 Units total) by mouth daily. 03/07/22  Yes Paseda, Dewaine Conger, FNP  diclofenac Sodium (VOLTAREN) 1 % GEL Apply 1  application topically 4 (four) times daily as needed (pain).   Yes [provider]  dofetilide (TIKOSYN) 125 MCG capsule Take 1 capsule (125 mcg total) by mouth 2 (two) times daily. 01/09/22  Yes Fenton, Clint R, PA  ferrous sulfate 325 (65 FE) MG EC tablet Take 325 mg by mouth daily.   Yes [provider]  furosemide (LASIX) 40 MG tablet Take 1 tablet (40 mg total) by mouth 2 (two) times daily. 01/10/22  Yes Satira Sark, MD  gabapentin (NEURONTIN) 300 MG capsule Take 1 capsule (300 mg total) by mouth 2 (two) times daily. 09/10/21  Yes Noreene Larsson, NP  isosorbide mononitrate (IMDUR) 30 MG 24 hr tablet Take 1 tablet (30 mg total) by mouth daily. 06/17/22  Yes Satira Sark, MD  levothyroxine (SYNTHROID) 100 MCG tablet Take 1 tablet (100 mcg total) by mouth daily before breakfast. 01/23/22  Yes Nida, Marella Chimes, MD  loratadine (CLARITIN) 10 MG tablet Take 10 mg by mouth daily.   Yes [provider]  magnesium oxide (MAG-OX) 400 MG tablet Take 0.5 tablets (200 mg total) by mouth daily. 10/24/20  Yes Fenton, Clint R, PA  melatonin 3 MG TABS tablet Take 3 mg by mouth at bedtime.   Yes [provider]  metoprolol tartrate (LOPRESSOR) 25 MG tablet Take 1 tablet (25 mg total) by mouth 2 (two) times daily. 03/18/22 09/14/22 Yes Imogene Burn,  PA-C  nitroGLYCERIN (NITROSTAT) 0.4 MG SL tablet DISSOLVE ONE TABLET UNDER THE TONGUE EVERY 5 MINUTES AS NEEDED FOR CHEST PAIN.  DO NOT EXCEED A TOTAL OF 3 DOSES IN 15 MINUTES Patient taking differently: DISSOLVE ONE TABLET UNDER THE TONGUE EVERY 5 MINUTES AS NEEDED FOR CHEST PAIN.  DO NOT EXCEED A TOTAL OF 3 DOSES IN 15 MINUTES 01/08/21  Yes Verta Ellen., NP  ondansetron (ZOFRAN-ODT) 4 MG disintegrating tablet Take 1 tablet (4 mg total) by mouth every 8 (eight) hours as needed for nausea or vomiting. 10/25/21  Yes Maudie Flakes, MD  potassium chloride SA (KLOR-CON) 20 MEQ tablet Take 3 tablets (60 mEq total) by  mouth daily. Patient taking differently: Take 40 mEq by mouth daily. 06/29/21  Yes Fenton, Clint R, PA  spironolactone (ALDACTONE) 25 MG tablet Take 1/2 (one-half) tablet by mouth once daily Patient taking differently: Take 12.5 mg by mouth daily. 06/05/22  Yes Satira Sark, MD  vitamin C (ASCORBIC ACID) 500 MG tablet Take 500 mg by mouth daily.   Yes [provider]  MAGnesium-Oxide 400 (240 Mg) MG tablet Take 0.5 tablets (200 mg total) by mouth daily. Patient not taking: Reported on 06/18/2022 05/06/22   Fenton, Clint R, PA  promethazine-dextromethorphan (PROMETHAZINE-DM) 6.25-15 MG/5ML syrup Take 5 mLs by mouth 4 (four) times daily as needed. Patient not taking: Reported on 06/18/2022 01/04/22   Volney American, PA-C      Allergies    Afrin [oxymetazoline], Amoxicillin, Lisinopril, Ranolazine, Vicodin [hydrocodone-acetaminophen], Demerol [meperidine], Flonase [fluticasone], and Lodine [etodolac]    Review of Systems   Review of Systems  All other systems reviewed and are negative.   Physical Exam Updated Vital Signs BP (!) 119/50   Pulse 74   Temp 97.8 F (36.6 C) (Oral)   Resp 14   SpO2 96%  Physical Exam Vitals and nursing note reviewed.  Constitutional:      Appearance: She is well-developed.  HENT:     Head: Atraumatic.  Eyes:     Extraocular Movements: Extraocular movements intact.     Pupils: Pupils are equal, round, and reactive to light.  Cardiovascular:     Rate and Rhythm: Normal rate.  Pulmonary:     Effort: Pulmonary effort is normal.  Musculoskeletal:     Cervical back: Normal range of motion and neck supple.     Right lower leg: No edema.     Left lower leg: No edema.  Skin:    General: Skin is warm and dry.  Neurological:     Mental Status: She is alert and oriented to person, place, and time.     ED Results / Procedures / Treatments   Labs (all labs ordered are listed, but only abnormal results are displayed) Labs Reviewed  BASIC  METABOLIC PANEL - Abnormal; Notable for the following components:      Result Value   Glucose, Bld 117 (*)    Creatinine, Ser 1.26 (*)    Calcium 8.5 (*)    GFR, Estimated 46 (*)    All other components within normal limits  CBC - Abnormal; Notable for the following components:   Hemoglobin 9.2 (*)    HCT 30.0 (*)    MCV 76.9 (*)    MCH 23.6 (*)    RDW 22.3 (*)    Platelets 443 (*)    All other components within normal limits  BRAIN NATRIURETIC PEPTIDE - Abnormal; Notable for the following components:   B  Natriuretic Peptide 305.0 (*)    All other components within normal limits  TROPONIN I (HIGH SENSITIVITY)  TROPONIN I (HIGH SENSITIVITY)    EKG EKG Interpretation  Date/Time:  Tuesday June 18 2022 17:18:31 EDT Ventricular Rate:  70 PR Interval:    QRS Duration: 90 QT Interval:  404 QTC Calculation: 436 R Axis:   18 Text Interpretation: Atrial fibrillation with premature ventricular or aberrantly conducted complexes ST & T wave abnormality, consider inferolateral ischemia Abnormal ECG afib is new compared to previous ekg Confirmed by Varney Biles [phone]) on 06/18/2022 5:56:38 PM  Radiology DG Chest 2 View  Result Date: 06/18/2022 CLINICAL DATA:  Palpitations.  Shortness of breath. EXAM: CHEST - 2 VIEW COMPARISON:  01/15/2022 FINDINGS: Chronic cardiomegaly. Chronic tortuous aorta. The lungs are clear. No edema, infiltrate, collapse or effusion. No acute bone finding. IMPRESSION: Chronic cardiomegaly and aortic tortuosity.  No active disease. Electronically Signed   By: Nelson Chimes M.D.   On: 06/18/2022 17:37    Procedures Procedures    Medications Ordered in ED Medications - No data to display  ED Course/ Medical Decision Making/ A&P                           Medical Decision Making Amount and/or Complexity of Data Reviewed Labs: ordered. Radiology: ordered.  Risk Decision regarding hospitalization.   This patient presents to the ED with chief complaint(s)  of chest pain, exertional shortness of breath with pertinent past medical history of CAD that is being medically managed, CHF which further complicates the presenting complaint. The complaint involves an extensive differential diagnosis and also carries with it a high risk of complications and morbidity.    The differential diagnosis includes ACS, PE, musculoskeletal pain.  A-fib with RVR and other tachydysrhythmia also considered in the differential.  The initial plan is to get basic blood work including troponins to ensure patient is not having NSTEMI. EKG showing A-fib but no evidence of ischemia.   Additional history obtained: Records reviewed Primary Care Documents and cardiology documents including telephone encounters recently.  Independent labs interpretation:  The following labs were independently interpreted: Troponin that is normal.  CBC is also normal.  Treatment and Reassessment: Patient states that she continues to have intermittent chest pain that is unprovoked.  Troponins are negative for myocardial injury.  Consultation: - Consulted or discussed management/test interpretation w/ external professional: Discussed case with cardiology service.  They recommend that we admit patient for refractory chest pain and their team will round on the patient.  Final Clinical Impression(s) / ED Diagnoses Final diagnoses:  Refractory angina [facility])    Rx / DC Orders ED Discharge Orders     None         Varney Biles, MD 06/18/22 2301

## 2022-06-18 NOTE — H&P (Signed)
History and Physical    Patient: Maria Fry DOB: [birth year] DOA: 06/18/2022 DOS: the patient was seen and examined on 06/19/2022 PCP: Renee Rival, FNP  Patient coming from: Home  Chief Complaint:  Chief Complaint  Patient presents with   Palpitations   HPI: Maria Fry is a 71 y.o. female with medical history significant of hypertension, hyperlipidemia, hypothyroidism, CAD, chronic combined systolic and diastolic CHF, atrial fibrillation on Eliquis and stable angina on Ranexa who presents to the emergency department due to several days of onset of chest pain.  Chest pain was described as sharp and was below the breasts on both sides of the chest as well as left shoulder.  Chest pain occurs both at rest and on walking and the pain worsens on ambulation due to shortness of breath which has resulted in decreased exercise tolerance.  Chest pain was nonreproducible, though she referred to residual soreness due to the chest pain on palpation.  Pain was similar to her anginal pain.  She has been unable to refill her Ranexa since last week because the pharmacy ran out of stock, so she has been having the chest pain, she was started on Imdur yesterday by cardiologist, but pain continued and worsened today.  She states that she has been taking 1 nitroglycerin daily, but her cardiologist told that she cannot be taking nitroglycerin on daily basis and that she needs to go to the ED if chest pain persist.  She denies fever, chills, nausea, vomiting, abdominal pain, headache.  She denies use of alcohol, tobacco or any other recreational drug use.  ED Course:  In the emergency department, temperature was 98.65F, respiratory rate 23/min, pulse 108 bpm, BP 125/93, O2 sat 94%.  Work-up in the ED showed microcytic anemia, BNP 305 (this was 766 on 01/15/2022), troponin x2 was negative. Chest x-ray showed chronic cardiomegaly and aortic tortuosity.  No active disease Cardiologist at [facility] was  consulted and recommended that patient can stay here at [facility] and that cardiology will round on him in the morning.  Hospitalist was asked to admit patient for further evaluation and management.  review of Systems: Review of systems as noted in the HPI. All other systems reviewed and are negative.   Past Medical History:  Diagnosis Date   Arthritis    Right knee   Atrial fibrillation (HCC)    CAD (coronary artery disease)    a. 06/07/15 NSTEMI/Cath: Dist LAD 100%, mid LAD 10%. Otw nl cors. Nl EF w/ inferoapical AK.   Essential hypertension    Hip pain    History of cardiomyopathy 04/23/2016   a. 04/2016 Echo: EF 45%, Gr2 DD; b. 11/2019 Echo: EF 45-50%, mod LVH. Gr2 DD. Nl RV fxn. Sev dil LA.    Hyperlipidemia    Hypothyroidism    NSTEMI (non-ST elevated myocardial infarction) ([place]) 06/05/2015   Vitamin D deficiency    Past Surgical History:  Procedure Laterality Date   ABDOMINAL HYSTERECTOMY     partial- one ovary remaining   BIOPSY  09/17/2020   Procedure: BIOPSY;  Surgeon: [name], DO;  Location: [facility] ENDO SUITE;  Service: Endoscopy;;   CARDIAC CATHETERIZATION N/A 06/06/2015   Procedure: Left Heart Cath and Coronary Angiography;  Surgeon: Peter M Martinique, MD;  Location: [place] CV LAB;  Service: Cardiovascular;  Laterality: N/A;   CARDIOVERSION N/A 09/14/2020   Procedure: CARDIOVERSION;  Surgeon: Satira Sark, MD;  Location: [facility] ENDO [facility];  Service: Cardiovascular;  Laterality: N/A;  CHOLECYSTECTOMY     COLONOSCOPY     COLONOSCOPY N/A 08/13/2017   Procedure: COLONOSCOPY;  Surgeon: Daneil Dolin, MD;  Location: [facility];  Service: Endoscopy;  Laterality: N/A;  8:30 AM   COLONOSCOPY WITH PROPOFOL N/A 01/18/2021   [name]: Multiple tubular adenomas removed, next colonoscopy in 3 years   ESOPHAGOGASTRODUODENOSCOPY N/A 09/11/2016   Procedure: ESOPHAGOGASTRODUODENOSCOPY (EGD);  Surgeon: Daneil Dolin, MD;  Location: [facility];  Service: Endoscopy;  Laterality:  N/A;  7:45 am - moved to 10/11 @ 10:30   ESOPHAGOGASTRODUODENOSCOPY (EGD) WITH PROPOFOL N/A 09/17/2020   [name]: Diffuse moderate inflammation characterized by erosions and erythema found in the entire stomach.  Biopsies positive for H. pylori.  Patient has not been treated due to drug allergies.   Heel spurs Left    MALONEY DILATION N/A 09/11/2016   Procedure: Venia Minks DILATION;  Surgeon: Daneil Dolin, MD;  Location: [facility];  Service: Endoscopy;  Laterality: N/A;   POLYPECTOMY  08/13/2017   Procedure: POLYPECTOMY;  Surgeon: Daneil Dolin, MD;  Location: [facility];  Service: Endoscopy;;  colon   POLYPECTOMY  01/18/2021   Procedure: POLYPECTOMY;  Surgeon: Daneil Dolin, MD;  Location: [facility];  Service: Endoscopy;;   SHOULDER ARTHROSCOPY Right    TUBAL LIGATION      Social History:  reports that she quit smoking about 19 years ago. Her smoking use included cigarettes. She started smoking about 44 years ago. She has a 20.00 pack-year smoking history. She has never used smokeless tobacco. She reports that she does not drink alcohol and does not use drugs.   Allergies  Allergen Reactions   Afrin [Oxymetazoline] Anaphylaxis   Amoxicillin Anaphylaxis   Lisinopril Anaphylaxis   Ranolazine Anaphylaxis    Throat swells, Dizzy, Reaction occurred with generic med   Vicodin [Hydrocodone-Acetaminophen] Diarrhea and Nausea And Vomiting   Demerol [Meperidine] Nausea And Vomiting   Flonase [Fluticasone]     anaphylaxis   Lodine [Etodolac] Rash    Family History  Problem Relation Age of Onset   Heart failure Father        Deceased   Heart attack Father        Deceased   Stroke Sister        Deceased   Heart failure Brother        Deceased   Cancer Brother        Deceased   Heart attack Brother        Deceased   COPD Daughter    Arthritis Daughter    Lupus Daughter    Colon cancer Neg Hx      Prior to Admission medications   Medication Sig Start Date End Date  Taking? Authorizing Provider  acetaminophen (TYLENOL) 500 MG tablet Take 500 mg by mouth every 6 (six) hours as needed for mild pain or moderate pain.   Yes [provider]  apixaban (ELIQUIS) 5 MG TABS tablet Take 1 tablet (5 mg total) by mouth 2 (two) times daily. 09/12/21  Yes Fenton, Clint R, PA  atorvastatin (LIPITOR) 80 MG tablet Take 1 tablet (80 mg total) by mouth at bedtime. 04/25/22  Yes Paseda, Dewaine Conger, FNP  calcium carbonate (OS-CAL - DOSED IN MG OF ELEMENTAL CALCIUM) 1250 (500 Ca) MG tablet Take 500 mg by mouth daily.   Yes [provider]  Cholecalciferol (VITAMIN D3) 50 MCG (2000 UT) capsule Take 1 capsule (2,000 Units total) by mouth daily. 03/07/22  Yes  Renee Rival, FNP  diclofenac Sodium (VOLTAREN) 1 % GEL Apply 1 application topically 4 (four) times daily as needed (pain).   Yes [provider]  dofetilide (TIKOSYN) 125 MCG capsule Take 1 capsule (125 mcg total) by mouth 2 (two) times daily. 01/09/22  Yes Fenton, Clint R, PA  ferrous sulfate 325 (65 FE) MG EC tablet Take 325 mg by mouth daily.   Yes [provider]  furosemide (LASIX) 40 MG tablet Take 1 tablet (40 mg total) by mouth 2 (two) times daily. 01/10/22  Yes Satira Sark, MD  gabapentin (NEURONTIN) 300 MG capsule Take 1 capsule (300 mg total) by mouth 2 (two) times daily. 09/10/21  Yes Noreene Larsson, NP  isosorbide mononitrate (IMDUR) 30 MG 24 hr tablet Take 1 tablet (30 mg total) by mouth daily. 06/17/22  Yes Satira Sark, MD  levothyroxine (SYNTHROID) 100 MCG tablet Take 1 tablet (100 mcg total) by mouth daily before breakfast. 01/23/22  Yes Nida, Marella Chimes, MD  loratadine (CLARITIN) 10 MG tablet Take 10 mg by mouth daily.   Yes [provider]  magnesium oxide (MAG-OX) 400 MG tablet Take 0.5 tablets (200 mg total) by mouth daily. 10/24/20  Yes Fenton, Clint R, PA  melatonin 3 MG TABS tablet Take 3 mg by mouth at bedtime.   Yes [provider]   metoprolol tartrate (LOPRESSOR) 25 MG tablet Take 1 tablet (25 mg total) by mouth 2 (two) times daily. 03/18/22 09/14/22 Yes Imogene Burn, PA-C  nitroGLYCERIN (NITROSTAT) 0.4 MG SL tablet DISSOLVE ONE TABLET UNDER THE TONGUE EVERY 5 MINUTES AS NEEDED FOR CHEST PAIN.  DO NOT EXCEED A TOTAL OF 3 DOSES IN 15 MINUTES Patient taking differently: DISSOLVE ONE TABLET UNDER THE TONGUE EVERY 5 MINUTES AS NEEDED FOR CHEST PAIN.  DO NOT EXCEED A TOTAL OF 3 DOSES IN 15 MINUTES 01/08/21  Yes Verta Ellen., NP  ondansetron (ZOFRAN-ODT) 4 MG disintegrating tablet Take 1 tablet (4 mg total) by mouth every 8 (eight) hours as needed for nausea or vomiting. 10/25/21  Yes Maudie Flakes, MD  potassium chloride SA (KLOR-CON) 20 MEQ tablet Take 3 tablets (60 mEq total) by mouth daily. Patient taking differently: Take 40 mEq by mouth daily. 06/29/21  Yes Fenton, Clint R, PA  spironolactone (ALDACTONE) 25 MG tablet Take 1/2 (one-half) tablet by mouth once daily Patient taking differently: Take 12.5 mg by mouth daily. 06/05/22  Yes Satira Sark, MD  vitamin C (ASCORBIC ACID) 500 MG tablet Take 500 mg by mouth daily.   Yes [provider]  MAGnesium-Oxide 400 (240 Mg) MG tablet Take 0.5 tablets (200 mg total) by mouth daily. Patient not taking: Reported on 06/18/2022 05/06/22   Fenton, Clint R, PA  promethazine-dextromethorphan (PROMETHAZINE-DM) 6.25-15 MG/5ML syrup Take 5 mLs by mouth 4 (four) times daily as needed. Patient not taking: Reported on 06/18/2022 01/04/22   Volney American, PA-C    Physical Exam: BP 133/65   Pulse 76   Temp 97.8 F (36.6 C) (Oral)   Resp 14   SpO2 98%   General: 71 y.o. year-old female well developed well nourished in no acute distress.  Alert and oriented x3. HEENT: NCAT, EOMI Neck: Supple, trachea medial Cardiovascular: Regular rate and rhythm with no rubs or gallops.  No thyromegaly or JVD noted.  No lower extremity edema. 2/4 pulses in all 4  extremities. Respiratory: Clear to auscultation with no wheezes or rales. Good inspiratory effort. Abdomen: Soft,  nontender nondistended with normal bowel sounds x4 quadrants. Muskuloskeletal: No cyanosis, clubbing or edema noted bilaterally Neuro: CN II-XII intact, strength 5/5 x 4, sensation, reflexes intact Skin: No ulcerative lesions noted or rashes Psychiatry: Judgement and insight appear normal. Mood is appropriate for condition and setting          Labs on Admission:  Basic Metabolic Panel: Recent Labs  Lab 06/18/22 1806  NA 137  K 4.7  CL 104  CO2 26  GLUCOSE 117*  BUN 18  CREATININE 1.26*  CALCIUM 8.5*   Liver Function Tests: No results for input(s): "AST", "ALT", "ALKPHOS", "BILITOT", "PROT", "ALBUMIN" in the last 168 hours. No results for input(s): "LIPASE", "AMYLASE" in the last 168 hours. No results for input(s): "AMMONIA" in the last 168 hours. CBC: Recent Labs  Lab 06/18/22 1806  WBC 9.3  HGB 9.2*  HCT 30.0*  MCV 76.9*  PLT 443*   Cardiac Enzymes: No results for input(s): "CKTOTAL", "CKMB", "CKMBINDEX", "TROPONINI" in the last 168 hours.  BNP (last 3 results) Recent Labs    01/15/22 1609 06/18/22 1806  BNP 766.0* 305.0*    ProBNP (last 3 results) No results for input(s): "PROBNP" in the last 8760 hours.  CBG: No results for input(s): "GLUCAP" in the last 168 hours.  Radiological Exams on Admission: DG Chest 2 View  Result Date: 06/18/2022 CLINICAL DATA:  Palpitations.  Shortness of breath. EXAM: CHEST - 2 VIEW COMPARISON:  01/15/2022 FINDINGS: Chronic cardiomegaly. Chronic tortuous aorta. The lungs are clear. No edema, infiltrate, collapse or effusion. No acute bone finding. IMPRESSION: Chronic cardiomegaly and aortic tortuosity.  No active disease. Electronically Signed   By: Nelson Chimes M.D.   On: 06/18/2022 17:37    EKG: I independently viewed the EKG done and my findings are as followed: A-fib with rate control with  PVCs  Assessment/Plan Present on Admission:  Chest pain  Constipation  Chronic combined systolic and diastolic heart failure (HCC)  CAD (coronary artery disease)  Active Problems:   Essential hypertension   CAD (coronary artery disease)   Chronic combined systolic and diastolic heart failure (HCC)   Mixed hyperlipidemia   Microcytic anemia   Atrial fibrillation, chronic (HCC)   Constipation   Chest pain   Elevated brain natriuretic peptide (BNP) level   Insomnia  Chest pain possibly secondary to unstable angina Cardiovascular risk factors include hypertension, hyperlipidemia, CAD, combined systolic and diastolic CHF Continue telemetry  Troponins x2 - 10 > 8 EKG showed A-fib with rate control with PVCs Cardiology will be consulted to help decide if Stress test is needed in am Versus other  diagnostic modalities.    Give aspirin, nitroglycerin prn  Microcytic anemia MCV 76.9, H/H 9.2/30, this was 9.8/30.4 on 01/15/2022 Iron studies will be checked  Elevated BNP Chronic combined systolic and diastolic CHF BNP 673 (this was 766 on 01/15/2022) Patient denies any leg swelling or increased abdominal girth Continue total input/output, daily weights and fluid restriction Continue Cardiac diet   Essential hypertension Metoprolol is held at this time in anticipation for possible cardiac stress in the morning  Mixed hyperlipidemia Continue Lipitor  Acquired hypothyroidism Continue Synthroid  CAD Continue Eliquis, Lipitor Metoprolol held in aspirin for possible stress test in the morning  Chronic atrial fibrillation Continue Eliquis, Tikosyn  Insomnia Continue melatonin   DVT prophylaxis: Eliquis  Code Status: Full code  Consults: Cardiology  Family Communication: Daughter and granddaughter at bedside (all questions answered to satisfaction)   Severity of Illness: The appropriate  patient status for this patient is OBSERVATION. Observation status is judged to be  reasonable and necessary in order to provide the required intensity of service to ensure the patient's safety. The patient's presenting symptoms, physical exam findings, and initial radiographic and laboratory data in the context of their medical condition is felt to place them at decreased risk for further clinical deterioration. Furthermore, it is anticipated that the patient will be medically stable for discharge from the hospital within 2 midnights of admission.   Author: Bernadette Hoit, DO 06/19/2022 1:31 AM  For on call review [email or web address].

## 2022-06-18 NOTE — Telephone Encounter (Signed)
Pt c/o Shortness Of Breath: STAT if SOB developed within the last 24 hours or pt is noticeably SOB on the phone  1. Are you currently SOB (can you hear that pt is SOB on the phone)? No  2. How long have you been experiencing SOB?  Today  3. Are you SOB when sitting or when up moving around?  When moving around  4. Are you currently experiencing any other symptoms?    No   Patient stated that she is feeling very weak and she feels like she is about to pass out when walking.  Patient stated she started new medication isosorbide mononitrate (IMDUR) 30 MG 24 hr tablet this morning.

## 2022-06-18 NOTE — ED Triage Notes (Signed)
Feels like her heart is racing, is very fatigued, unable to walk far at all. Started earlier today.

## 2022-06-18 NOTE — Telephone Encounter (Signed)
Patient states that today she has been feeling very weak, tired and has a lot sob and every time she gets up to walk, she feels as if she is going to pass out. States that she knows something is going on with her heart and feels that she needs to be seen asap. Denies chest pain or dizziness. Instructed patient to go to the ER or call 911 for further evaluation. States that she will have someone take her to the ER.

## 2022-06-19 ENCOUNTER — Other Ambulatory Visit (HOSPITAL_COMMUNITY): Payer: Self-pay

## 2022-06-19 ENCOUNTER — Observation Stay (HOSPITAL_BASED_OUTPATIENT_CLINIC_OR_DEPARTMENT_OTHER): Payer: Medicare Other

## 2022-06-19 DIAGNOSIS — E039 Hypothyroidism, unspecified: Secondary | ICD-10-CM | POA: Diagnosis not present

## 2022-06-19 DIAGNOSIS — G4700 Insomnia, unspecified: Secondary | ICD-10-CM

## 2022-06-19 DIAGNOSIS — R079 Chest pain, unspecified: Secondary | ICD-10-CM | POA: Diagnosis not present

## 2022-06-19 DIAGNOSIS — I259 Chronic ischemic heart disease, unspecified: Secondary | ICD-10-CM

## 2022-06-19 DIAGNOSIS — I482 Chronic atrial fibrillation, unspecified: Secondary | ICD-10-CM | POA: Diagnosis not present

## 2022-06-19 DIAGNOSIS — I4891 Unspecified atrial fibrillation: Secondary | ICD-10-CM | POA: Diagnosis not present

## 2022-06-19 DIAGNOSIS — I5042 Chronic combined systolic (congestive) and diastolic (congestive) heart failure: Secondary | ICD-10-CM | POA: Diagnosis not present

## 2022-06-19 DIAGNOSIS — I509 Heart failure, unspecified: Secondary | ICD-10-CM | POA: Diagnosis not present

## 2022-06-19 DIAGNOSIS — D649 Anemia, unspecified: Secondary | ICD-10-CM | POA: Diagnosis not present

## 2022-06-19 DIAGNOSIS — E669 Obesity, unspecified: Secondary | ICD-10-CM

## 2022-06-19 DIAGNOSIS — R7989 Other specified abnormal findings of blood chemistry: Secondary | ICD-10-CM

## 2022-06-19 LAB — COMPREHENSIVE METABOLIC PANEL
ALT: 11 U/L (ref 0–44)
AST: 16 U/L (ref 15–41)
Albumin: 3.4 g/dL — ABNORMAL LOW (ref 3.5–5.0)
Alkaline Phosphatase: 72 U/L (ref 38–126)
Anion gap: 7 (ref 5–15)
BUN: 20 mg/dL (ref 8–23)
CO2: 26 mmol/L (ref 22–32)
Calcium: 8.6 mg/dL — ABNORMAL LOW (ref 8.9–10.3)
Chloride: 104 mmol/L (ref 98–111)
Creatinine, Ser: 1.05 mg/dL — ABNORMAL HIGH (ref 0.44–1.00)
GFR, Estimated: 58 mL/min — ABNORMAL LOW (ref >60)
Glucose, Bld: 96 mg/dL (ref 70–99)
Potassium: 4.2 mmol/L (ref 3.5–5.1)
Sodium: 137 mmol/L (ref 135–145)
Total Bilirubin: 0.5 mg/dL (ref 0.3–1.2)
Total Protein: 7.9 g/dL (ref 6.5–8.1)

## 2022-06-19 LAB — ECHOCARDIOGRAM LIMITED
Height: 63 in
S' Lateral: 3.8 cm
Weight: 3424 oz

## 2022-06-19 LAB — IRON AND TIBC
Iron: 21 ug/dL — ABNORMAL LOW (ref 28–170)
Saturation Ratios: 5 % — ABNORMAL LOW (ref 10.4–31.8)
TIBC: 456 ug/dL — ABNORMAL HIGH (ref 250–450)
UIBC: 435 ug/dL

## 2022-06-19 LAB — FERRITIN: Ferritin: 4 ng/mL — ABNORMAL LOW (ref 11–307)

## 2022-06-19 LAB — CBC
HCT: 28.2 % — ABNORMAL LOW (ref 36.0–46.0)
Hemoglobin: 8.5 g/dL — ABNORMAL LOW (ref 12.0–15.0)
MCH: 23.3 pg — ABNORMAL LOW (ref 26.0–34.0)
MCHC: 30.1 g/dL (ref 30.0–36.0)
MCV: 77.3 fL — ABNORMAL LOW (ref 80.0–100.0)
Platelets: 382 10*3/uL (ref 150–400)
RBC: 3.65 MIL/uL — ABNORMAL LOW (ref 3.87–5.11)
RDW: 22 % — ABNORMAL HIGH (ref 11.5–15.5)
WBC: 9.7 10*3/uL (ref 4.0–10.5)
nRBC: 0 % (ref 0.0–0.2)

## 2022-06-19 LAB — PHOSPHORUS: Phosphorus: 3.1 mg/dL (ref 2.5–4.6)

## 2022-06-19 LAB — HIV ANTIBODY (ROUTINE TESTING W REFLEX): HIV Screen 4th Generation wRfx: NONREACTIVE

## 2022-06-19 LAB — MAGNESIUM: Magnesium: 2.4 mg/dL (ref 1.7–2.4)

## 2022-06-19 MED ORDER — APIXABAN 5 MG PO TABS
5.0000 mg | ORAL_TABLET | Freq: Two times a day (BID) | ORAL | Status: DC
  Administered 2022-06-19 – 2022-06-21 (×5): 5 mg via ORAL
  Filled 2022-06-19 (×6): qty 1

## 2022-06-19 MED ORDER — METOPROLOL TARTRATE 25 MG PO TABS
25.0000 mg | ORAL_TABLET | Freq: Two times a day (BID) | ORAL | Status: DC
  Administered 2022-06-19 (×2): 25 mg via ORAL
  Filled 2022-06-19 (×3): qty 1

## 2022-06-19 MED ORDER — AMLODIPINE BESYLATE 5 MG PO TABS
2.5000 mg | ORAL_TABLET | Freq: Every day | ORAL | Status: DC
Start: 2022-06-19 — End: 2022-06-21
  Administered 2022-06-19 – 2022-06-21 (×3): 2.5 mg via ORAL
  Filled 2022-06-19 (×3): qty 1

## 2022-06-19 MED ORDER — ATORVASTATIN CALCIUM 40 MG PO TABS
80.0000 mg | ORAL_TABLET | Freq: Every day | ORAL | Status: DC
  Administered 2022-06-19 – 2022-06-20 (×3): 80 mg via ORAL
  Filled 2022-06-19 (×3): qty 2

## 2022-06-19 MED ORDER — MELATONIN 3 MG PO TABS
3.0000 mg | ORAL_TABLET | Freq: Every day | ORAL | Status: DC
  Administered 2022-06-19 – 2022-06-20 (×3): 3 mg via ORAL
  Filled 2022-06-19 (×3): qty 1

## 2022-06-19 MED ORDER — SPIRONOLACTONE 12.5 MG HALF TABLET
12.5000 mg | ORAL_TABLET | Freq: Every day | ORAL | Status: DC
  Administered 2022-06-19 – 2022-06-21 (×3): 12.5 mg via ORAL
  Filled 2022-06-19 (×3): qty 1

## 2022-06-19 MED ORDER — LEVOTHYROXINE SODIUM 100 MCG PO TABS
100.0000 ug | ORAL_TABLET | Freq: Every day | ORAL | Status: DC
  Administered 2022-06-19 – 2022-06-21 (×3): 100 ug via ORAL
  Filled 2022-06-19 (×3): qty 1

## 2022-06-19 MED ORDER — DOFETILIDE 125 MCG PO CAPS
125.0000 ug | ORAL_CAPSULE | Freq: Two times a day (BID) | ORAL | Status: DC
  Administered 2022-06-19 – 2022-06-21 (×5): 125 ug via ORAL
  Filled 2022-06-19 (×12): qty 1

## 2022-06-19 MED ORDER — FUROSEMIDE 40 MG PO TABS
40.0000 mg | ORAL_TABLET | Freq: Every day | ORAL | Status: DC
  Administered 2022-06-19 – 2022-06-21 (×3): 40 mg via ORAL
  Filled 2022-06-19 (×3): qty 1

## 2022-06-19 NOTE — Assessment & Plan Note (Addendum)
-  MCV 76.9 -No signs of acute bleeding -Continue to follow hemoglobin trend. -No overt bleeding appreciated.

## 2022-06-19 NOTE — Consult Note (Addendum)
Cardiology Consultation:   Patient ID: Maria Fry MRN: [id number]; DOB: 01-19-1951  Admit date: 06/18/2022 Date of Consult: 06/19/2022  PCP:  Renee Rival, [facility] Providers Cardiologist:  Rozann Lesches, MD  Electrophysiologist:  Thompson Grayer, MD       Patient Profile:   Maria Fry is a 71 y.o. 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 Josephine Cables.  History of Present Illness:   Maria Fry was last examined by Ermalinda Barrios, PA in 04/2022 and reported that her dizziness had improved with switching back from Coreg to Lopressor. She had self discontinued Spironolactone but this was restarted in the interim and she was overall tolerating well at that time. She did follow-up with [facility] on 05/10/2022 and was continued on Tikosyn 125 mcg twice daily, Eliquis 5 mg twice daily and Lopressor 25 mg twice daily.  Was in normal sinus rhythm at that time.  She contacted the office on 06/17/2022 reporting brand-name Ranexa was no longer available at her pharmacy and the generic form of this had caused her throat to swell. Therefore, it was recommended she start Imdur 30 mg daily. She called the office on 06/18/2022 reporting worsening weakness and dyspnea along with presyncope. She was advised to go to the ED for further evaluation.  She reports she had been in her normal state of health until she ran out of brand-name Ranexa last week. Over the weekend, she reports having her typical stable angina and pain would resolve with SL NTGx1.  Pain would occur at rest and was not associated with exertion and she describes it as a shooting pain. She was switched to Imdur as outlined above and started this yesterday but throughout the day felt very  fatigued and dizzy. Reports having a presyncopal episode when walking to her kitchen. She questions if this was possibly secondary to the medication. Also reports this felt like her prior atrial fibrillation as well and she could tell "something was wrong with her heart". She reports dyspnea on exertion but no specific orthopnea, PND or pitting edema. Says that her weight has actually declined by 8 pounds with dietary changes.  Initial labs showed WBC 9.3, Hgb 9.2 (similar to prior values from 01/2022), platelets 443, Na+ 137, K+ 4.7 and creatinine 1.26.  BNP 305. Initial and repeat Hs Troponin negative at 10 and 8. CXR showing chronic cardiomegaly and aortic tortuosity with no active disease. EKG showing rate-controlled atrial fibrillation, HR 70 with PVC's.    Past Medical History:  Diagnosis Date   Arthritis    Right knee   Atrial fibrillation (HCC)    CAD (coronary artery disease)    a. 06/07/15 NSTEMI/Cath: Dist LAD 100%, mid LAD 10%. Otw nl cors. Nl EF w/ inferoapical AK.   Essential hypertension    Hip pain    History of cardiomyopathy 04/23/2016   a. 04/2016 Echo: EF 45%, Gr2 DD; b. 11/2019 Echo: EF 45-50%, mod LVH. Gr2 DD. Nl RV fxn. Sev dil LA.    Hyperlipidemia    Hypothyroidism    NSTEMI (non-ST elevated myocardial infarction) ([place]) 06/05/2015   Vitamin D deficiency     Past Surgical History:  Procedure Laterality Date   ABDOMINAL HYSTERECTOMY     partial-  one ovary remaining   BIOPSY  09/17/2020   Procedure: BIOPSY;  Surgeon: [name], DO;  [facility];  Service: Endoscopy;;   CARDIAC CATHETERIZATION N/A 06/06/2015   Procedure: Left Heart Cath and Coronary Angiography;  Surgeon: Peter M Martinique, MD;  Location: [place] [facility];  Service: Cardiovascular;  Laterality: N/A;   CARDIOVERSION N/A 09/14/2020   Procedure: CARDIOVERSION;  Surgeon: Satira Sark, MD;  [facility];  Service: Cardiovascular;  Laterality: N/A;   CHOLECYSTECTOMY      COLONOSCOPY     COLONOSCOPY N/A 08/13/2017   Procedure: COLONOSCOPY;  Surgeon: Daneil Dolin, MD;  [facility];  Service: Endoscopy;  Laterality: N/A;  8:30 AM   COLONOSCOPY WITH PROPOFOL N/A 01/18/2021   [name]: Multiple tubular adenomas removed, next colonoscopy in 3 years   ESOPHAGOGASTRODUODENOSCOPY N/A 09/11/2016   Procedure: ESOPHAGOGASTRODUODENOSCOPY (EGD);  Surgeon: Daneil Dolin, MD;  [facility];  Service: Endoscopy;  Laterality: N/A;  7:45 am - moved to 10/11 @ 10:30   ESOPHAGOGASTRODUODENOSCOPY (EGD) WITH PROPOFOL N/A 09/17/2020   [name]: Diffuse moderate inflammation characterized by erosions and erythema found in the entire stomach.  Biopsies positive for H. pylori.  Patient has not been treated due to drug allergies.   Heel spurs Left    MALONEY DILATION N/A 09/11/2016   Procedure: Venia Minks DILATION;  Surgeon: Daneil Dolin, MD;  [facility];  Service: Endoscopy;  Laterality: N/A;   POLYPECTOMY  08/13/2017   Procedure: POLYPECTOMY;  Surgeon: Daneil Dolin, MD;  [facility];  Service: Endoscopy;;  colon   POLYPECTOMY  01/18/2021   Procedure: POLYPECTOMY;  Surgeon: Daneil Dolin, MD;  [facility];  Service: Endoscopy;;   SHOULDER ARTHROSCOPY Right    TUBAL LIGATION       Home Medications:  Prior to Admission medications   Medication Sig Start Date End Date Taking? Authorizing Provider  acetaminophen (TYLENOL) 500 MG tablet Take 500 mg by mouth every 6 (six) hours as needed for mild pain or moderate pain.   Yes [provider]  apixaban (ELIQUIS) 5 MG TABS tablet Take 1 tablet (5 mg total) by mouth 2 (two) times daily. 09/12/21  Yes Fenton, Clint R, PA  atorvastatin (LIPITOR) 80 MG tablet Take 1 tablet (80 mg total) by mouth at bedtime. 04/25/22  Yes Paseda, Dewaine Conger, FNP  calcium carbonate (OS-CAL - DOSED IN MG OF ELEMENTAL CALCIUM) 1250 (500 Ca) MG tablet Take 500 mg by mouth daily.   Yes [provider]  Cholecalciferol (VITAMIN D3) 50 MCG (2000 UT) capsule Take 1 capsule (2,000 Units total) by mouth daily. 03/07/22  Yes Paseda, Dewaine Conger, FNP  diclofenac Sodium (VOLTAREN) 1 % GEL Apply 1 application topically 4 (four) times daily as needed (pain).   Yes [provider]  dofetilide (TIKOSYN) 125 MCG capsule Take 1 capsule (125 mcg total) by mouth 2 (two) times daily. 01/09/22  Yes Fenton, Clint R, PA  ferrous sulfate 325 (65 FE) MG EC tablet Take 325 mg by mouth daily.   Yes [provider]  furosemide (LASIX) 40 MG tablet Take 1 tablet (40 mg total) by mouth 2 (two) times daily. 01/10/22  Yes Satira Sark, MD  gabapentin (NEURONTIN) 300 MG capsule Take 1 capsule (300 mg total) by mouth 2 (two) times daily. 09/10/21  Yes Noreene Larsson, NP  isosorbide mononitrate (IMDUR) 30 MG 24 hr tablet Take 1 tablet (  30 mg total) by mouth daily. 06/17/22  Yes Satira Sark, MD  levothyroxine (SYNTHROID) 100 MCG tablet Take 1 tablet (100 mcg total) by mouth daily before breakfast. 01/23/22  Yes Nida, Marella Chimes, MD  loratadine (CLARITIN) 10 MG tablet Take 10 mg by mouth daily.   Yes [provider]  magnesium oxide (MAG-OX) 400 MG tablet Take 0.5 tablets (200 mg total) by mouth daily. 10/24/20  Yes Fenton, Clint R, PA  melatonin 3 MG TABS tablet Take 3 mg by mouth at bedtime.   Yes [provider]  metoprolol tartrate (LOPRESSOR) 25 MG tablet Take 1 tablet (25 mg total) by mouth 2 (two) times daily. 03/18/22 09/14/22 Yes Imogene Burn, PA-C  nitroGLYCERIN (NITROSTAT) 0.4 MG SL tablet DISSOLVE ONE TABLET UNDER THE TONGUE EVERY 5 MINUTES AS NEEDED FOR CHEST PAIN.  DO NOT EXCEED A TOTAL OF 3 DOSES IN 15 MINUTES Patient taking differently: DISSOLVE ONE TABLET UNDER THE TONGUE EVERY 5 MINUTES AS NEEDED FOR CHEST PAIN.  DO NOT EXCEED A TOTAL OF 3 DOSES IN 15 MINUTES 01/08/21  Yes Verta Ellen., NP  ondansetron (ZOFRAN-ODT) 4 MG disintegrating tablet Take  1 tablet (4 mg total) by mouth every 8 (eight) hours as needed for nausea or vomiting. 10/25/21  Yes Maudie Flakes, MD  potassium chloride SA (KLOR-CON) 20 MEQ tablet Take 3 tablets (60 mEq total) by mouth daily. Patient taking differently: Take 40 mEq by mouth daily. 06/29/21  Yes Fenton, Clint R, PA  spironolactone (ALDACTONE) 25 MG tablet Take 1/2 (one-half) tablet by mouth once daily Patient taking differently: Take 12.5 mg by mouth daily. 06/05/22  Yes Satira Sark, MD  vitamin C (ASCORBIC ACID) 500 MG tablet Take 500 mg by mouth daily.   Yes [provider]  MAGnesium-Oxide 400 (240 Mg) MG tablet Take 0.5 tablets (200 mg total) by mouth daily. Patient not taking: Reported on 06/18/2022 05/06/22   Fenton, Clint R, PA  promethazine-dextromethorphan (PROMETHAZINE-DM) 6.25-15 MG/5ML syrup Take 5 mLs by mouth 4 (four) times daily as needed. Patient not taking: Reported on 06/18/2022 01/04/22   Volney American, PA-C    Inpatient Medications: Scheduled Meds:  apixaban  5 mg Oral BID   atorvastatin  80 mg Oral QHS   dofetilide  125 mcg Oral BID   furosemide  40 mg Oral Daily   levothyroxine  100 mcg Oral QAC breakfast   melatonin  3 mg Oral QHS   metoprolol tartrate  25 mg Oral BID   spironolactone  12.5 mg Oral Daily   Continuous Infusions:  PRN Meds:   Allergies:    Allergies  Allergen Reactions   Afrin [Oxymetazoline] Anaphylaxis   Amoxicillin Anaphylaxis   Lisinopril Anaphylaxis   Ranolazine Anaphylaxis    Throat swells, Dizzy, Reaction occurred with generic med   Vicodin [Hydrocodone-Acetaminophen] Diarrhea and Nausea And Vomiting   Demerol [Meperidine] Nausea And Vomiting   Flonase [Fluticasone]     anaphylaxis   Lodine [Etodolac] Rash    Social History:   Social History   Socioeconomic History   Marital status: Married    Spouse name: Not on file   Number of children: 3   Years of education: 10   Highest education level: Not on file   Occupational History   Occupation: Company secretary at [employer]  Tobacco Use   Smoking status: Former    Packs/day: 1.00    Years: 20.00    Total pack years: 20.00  Types: Cigarettes    Start date: 12/02/1977    Quit date: 12/02/2002    Years since quitting: 19.5   Smokeless tobacco: Never   Tobacco comments:    Former smoker 05/10/22  Vaping Use   Vaping Use: Never used  Substance and Sexual Activity   Alcohol use: No    Alcohol/week: 0.0 standard drinks of alcohol   Drug use: No   Sexual activity: Not Currently  Other Topics Concern   Not on file  Social History Narrative   Retired from [employer].   Social Determinants of Health   Financial Resource Strain: Low Risk  (11/28/2021)   Overall Financial Resource Strain (CARDIA)    Difficulty of Paying Living Expenses: Not hard at all  Food Insecurity: No Food Insecurity (11/28/2021)   Hunger Vital Sign    Worried About Running Out of Food in the Last Year: Never true    Ran Out of Food in the Last Year: Never true  Transportation Needs: No Transportation Needs (11/28/2021)   PRAPARE - Hydrologist (Medical): No    Lack of Transportation (Non-Medical): No  Physical Activity: Insufficiently Active (11/28/2021)   Exercise Vital Sign    Days of Exercise per Week: 5 days    Minutes of Exercise per Session: 10 min  Stress: No Stress Concern Present (11/28/2021)   [place]    Feeling of Stress : Not at all  Social Connections: Moderately Integrated (11/28/2021)   Social Connection and Isolation Panel [NHANES]    Frequency of Communication with Friends and Family: More than three times a week    Frequency of Social Gatherings with Friends and Family: More than three times a week    Attends Religious Services: More than 4 times per year    Active Member of [employer] or Organizations: No    Attends Archivist Meetings:  Never    Marital Status: Married  Human resources officer Violence: Not At Risk (11/28/2021)   Humiliation, Afraid, Rape, and Kick questionnaire    Fear of Current or Ex-Partner: No    Emotionally Abused: No    Physically Abused: No    Sexually Abused: No    Family History:    Family History  Problem Relation Age of Onset   Heart failure Father        Deceased   Heart attack Father        Deceased   Stroke Sister        Deceased   Heart failure Brother        Deceased   Cancer Brother        Deceased   Heart attack Brother        Deceased   COPD Daughter    Arthritis Daughter    Lupus Daughter    Colon cancer Neg Hx      ROS:  Please see the history of present illness.   All other ROS reviewed and negative.     Physical Exam/Data:   Vitals:   06/19/22 0147 06/19/22 0200 06/19/22 0236 06/19/22 0313  BP:  120/70 131/65 131/65  Pulse:  75 71 71  Resp:  '19 18 18  '$ Temp:  98.2 F (36.8 C) 97.6 F (36.4 C) 97.6 F (36.4 C)  TempSrc:    Oral  SpO2: 100% 95% 100%   Weight:    97.1 kg  Height:    '5\' 3"'$  (1.6 m)   No  intake or output data in the 24 hours ending 06/19/22 0915    06/19/2022    3:13 AM 05/10/2022    9:45 AM 04/23/2022   12:47 PM  Last 3 Weights  Weight (lbs) 214 lb 222 lb 222 lb 9.6 oz  Weight (kg) 97.07 kg 100.699 kg 100.971 kg     Body mass index is 37.91 kg/m.  General:  Well nourished, well developed female appearing in no acute distress HEENT: normal Neck: no JVD Vascular: No carotid bruits; Distal pulses 2+ bilaterally Cardiac:  normal S1, S2; Irregularly irregular. 2/6 systolic murmur along Apex.  Lungs:  clear to auscultation bilaterally, no wheezing, rhonchi or rales  Abd: soft, nontender, no hepatomegaly  Ext: no pitting edema. SCD's in place.  Musculoskeletal:  No deformities, BUE and BLE strength normal and equal Skin: warm and dry  Neuro:  CNs 2-12 intact, no focal abnormalities noted Psych:  Normal affect   EKG:  The EKG was personally  reviewed and demonstrates: Rate-controlled atrial fibrillation, HR 70 with PVC's.   Telemetry:  Telemetry was personally reviewed and demonstrates: Atrial fibrillation, HR in 60's to 70's with occasional PVC's.   Relevant CV Studies:  LHC: 06/2015 Dist LAD lesion, 100% stenosed. Mid LAD lesion, 10% stenosed. The left ventricular systolic function is normal.   1. 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 Good LV function   Plan: DAPT, beta blocker and risk factor modification.     NST: 11/2019 1. Small scar in the apical segment of the inferior wall with peri-infarct ischemia.   2. Global hypokinesia and mild LEFT ventricular dilatation.   3. Left ventricular ejection fraction 41%   4. Non invasive risk stratification*: Intermediate  Echocardiogram: 01/2022 IMPRESSIONS     1. Left ventricular ejection fraction, by estimation, is 45 to 50%. The  left ventricle has mildly decreased function. The left ventricle  demonstrates global hypokinesis. The left ventricular internal cavity size  was mildly dilated. There is moderate  concentric left ventricular hypertrophy. Left ventricular diastolic  parameters are consistent with Grade II diastolic dysfunction  (pseudonormalization).   2. Right ventricular systolic function is low normal. The right  ventricular size is normal. There is severely elevated pulmonary artery  systolic pressure. The estimated right ventricular systolic pressure is  19.6 mmHg.   3. The mitral valve is grossly normal. Moderate mitral valve  regurgitation.   4. Tricuspid valve regurgitation is moderate.   5. The aortic valve is tricuspid. Aortic valve regurgitation is mild.  Aortic regurgitation PHT measures 607 msec.   6. The inferior vena cava is normal in size with greater than 50%  respiratory variability, suggesting right atrial pressure of 3 mmHg.   Comparison(s): Prior images reviewed side by side. LVEF mildly reduced and  low  normal RV contraction. Moderate mitral regurgitation. Estimated RVSP  severely elevated.    Laboratory Data:  High Sensitivity Troponin:   Recent Labs  Lab 06/18/22 1806 06/18/22 2119  TROPONINIHS 10 8     Chemistry Recent Labs  Lab 06/18/22 1806 06/19/22 0524  NA 137 137  K 4.7 4.2  CL 104 104  CO2 26 26  GLUCOSE 117* 96  BUN 18 20  CREATININE 1.26* 1.05*  CALCIUM 8.5* 8.6*  MG  --  2.4  GFRNONAA 46* 58*  ANIONGAP 7 7    Recent Labs  Lab 06/19/22 0524  PROT 7.9  ALBUMIN 3.4*  AST 16  ALT 11  ALKPHOS 72  BILITOT 0.5  Lipids No results for input(s): "CHOL", "TRIG", "HDL", "LABVLDL", "LDLCALC", "CHOLHDL" in the last 168 hours.  Hematology Recent Labs  Lab 06/18/22 1806 06/19/22 0524  WBC 9.3 9.7  RBC 3.90 3.65*  HGB 9.2* 8.5*  HCT 30.0* 28.2*  MCV 76.9* 77.3*  MCH 23.6* 23.3*  MCHC 30.7 30.1  RDW 22.3* 22.0*  PLT 443* 382   Thyroid No results for input(s): "TSH", "FREET4" in the last 168 hours.  BNP Recent Labs  Lab 06/18/22 1806  BNP 305.0*    DDimer No results for input(s): "DDIMER" in the last 168 hours.   Radiology/Studies:  DG Chest 2 View  Result Date: 06/18/2022 CLINICAL DATA:  Palpitations.  Shortness of breath. EXAM: CHEST - 2 VIEW COMPARISON:  01/15/2022 FINDINGS: Chronic cardiomegaly. Chronic tortuous aorta. The lungs are clear. No edema, infiltrate, collapse or effusion. No acute bone finding. IMPRESSION: Chronic cardiomegaly and aortic tortuosity.  No active disease. Electronically Signed   By: Nelson Chimes M.D.   On: 06/18/2022 17:37     Assessment and Plan:   1. Paroxysmal Atrial Fibrillation - She has been on Tikosyn since 10/2020 and by review of notes has presumably maintained normal sinus rhythm since.  She was in normal sinus rhythm at her office visit in 05/2022 but is in atrial fibrillation this admission. She reports dyspnea on exertion, fatigue and weakness yesterday which resembled her prior atrial fibrillation and  suspect this was the culprit of her symptoms. - She has not missed any doses of Tikosyn or Eliquis. Will plan to obtain a limited echocardiogram for reassessment of her EF. If this remains stable, may need to consider DCCV this admission if she does not convert back to NSR. Would not require TEE given compliance with anticoagulation for at least 3+ weeks. - Continue Eliquis 5 mg twice daily for anticoagulation along with Tikosyn 125 mcg twice daily and Lopressor 25 mg twice daily.  2. CAD/Chest Pain - She had an NSTEMI in 2016 with cath showing 100% distal LAD occlusion and NST in 2020 showed a small scar in the inferior wall with peri-infarct ischemia.  She had overall been doing well until she was unable to obtain brand-name Ranexa as this is no longer available in the [place]. Was previously allergic to Ranolazine and was started on Imdur yesterday but developed worsening symptoms with this. Unclear if possibly due to her atrial fibrillation or Imdur. - Hs troponin values have been negative. Will plan to obtain an updated limited echocardiogram for reassessment of her EF and wall motion. If she is found to have any structural abnormalities, may need to consider repeat catheterization before pursuing DCCV as outlined above.  - Continue Lipitor 80 mg daily and Lopressor 25 mg twice daily. She is not on ASA given the need for anticoagulation. Will review with Dr. Harl Bowie in regards to adding Amlodipine for antianginal benefit as options are limited but also need to optimize meds for her CHF.   3. HFmrEF - Her EF was at 50-55% in 01/2021, at 45-50% in 01/2022.  Her weight has actually declined by 8 pounds on her home scales while BNP was mildly elevated at 305, she does not appear volume overloaded on examination today. - She reports very frequent urination with Lasix 40 mg twice daily at home.  Given her weight loss, will order as 40 mg daily for now. Continue Lopressor 25 mg twice daily (can perhaps change to  Toprol-XL prior to discharge as she was previously intolerant to Coreg) and  Spironolactone 12.5 mg daily.  Ideally, would like to add an SGLT2 inhibitor prior to discharge if EF remains reduced by repeat echo.   4. HTN - BP has been stable since admission, at 131/65 on most recent check. Continue PTA Lopressor and Spironolactone.   5. HLD - FLP in 03/2022 showed total cholesterol 123, triglycerides 51, HDL 48 and LDL 63. She remains on Atorvastatin '80mg'$  daily.   6. Mitral Regurgitation - This was moderate by echo in 01/2022. Continue to follow.   7. Pulmonary HTN - Echo in 01/2022 showed low-normal RV function and severely elevated PASP of 67 mmHg. Appears her home sleep study in 01/2021 showed only mild OSA. If having to undergo a repeat cath in the future, would consider a RHC at that time as well.   8. Anemia - Hemoglobin was at 9.2 on admission which is overall similar to prior values but declined to 8.5 today. No reports of active bleeding. Appears she is on iron supplementation as an outpatient. Further management per the admitting team   Risk Assessment/Risk Scores:    CHA2DS2-VASc Score = 5   This indicates a 7.2% annual risk of stroke. The patient's score is based upon: CHF History: 1 HTN History: 1 Diabetes History: 0 Stroke History: 0 Vascular Disease History: 1 Age Score: 1 Gender Score: 1    For questions or updates, please contact [place] Please consult [email or web address] for contact info under    Signed, Erma Heritage, PA-C  06/19/2022 9:15 AM  Attending note  Patient seen and discussed with PA Ahmed Prima, I agree with her documentation. 71 yo female history of CAD with NSTEMI, afib on tikosyn, HTn, HL, HFmrEF presents with chest pain.   K 4.7 Cr 1.26 BUN 18 WBC 9.3 Hgb 9.2 Plt 443 BNP 305 Ferritin 4 Trop neg x 2 CXR no acute process EKG afib no acute ischemic changes 01/2022 echo: LVEF 45-50%, grade II dd, severe pulm HTN, mod MR and TR 11/2019  nuclear stress: apical inferior scar with peri-infarct ischemia   1.Chest pain/CAD with chronic stable angina - history of NSTEMI 2016, distal LAD occlusion managed medically.  - onset of symptosm after running out of ranexa. From notes allergy to generic ranexa and the brand name she was on is no longer available in [place].   - imdur caused fatigue, dizziness - trops neg, EKG without acute ischemic changes. Echo is pending.  - suspect recurrence of her chronic angina since coming off ranexa, needs adjustments of antianginals, at this time do not plan on repeat ischemic testing unless significant findings on echo.  -start norvac 2.'5mg'$  daily  2.Afib - EKG shows rate controlled afib. EKGs reviewed over 1 years worth first recurrent afib noted over that time - she is on dofetilide, lopressor, eliquis at home - 05/03/22 EKG was NSR. Apepars to be paroxysmal on current regimen with controlled rates when in afib - recurrent symptoms of her afib, will plan for DCCV tomorrow. Has not missed any doses of anticoag  3. HFmrEF - did not tolerate coreg - limited echo pending - adjust regimen pending echo  4.Anemia - Hgb 9.2-->8.5, 5 months ago was 9.8 -microcytic, low ferritin consisten with iron deficiency - management per primary team   Carlyle Dolly MD

## 2022-06-19 NOTE — Assessment & Plan Note (Addendum)
-  Continue the use of Eliquis for secondary prevention and -Patient is s/p cardioversion on 06/20/2022; so far successful and has remained in sinus rhythm. -Continue outpatient follow-up with cardiology service.

## 2022-06-19 NOTE — Progress Notes (Addendum)
patient received eliquis and the tikosyn this morning at 0220 for eliquis and 5331 for tikosyn. I talked with pharmacist garrett and he said it was okay to give patient again at 1000 this morning. patient refused because she was no comfortable getting it that close. MD [facility] notified.

## 2022-06-19 NOTE — Assessment & Plan Note (Addendum)
-  Continue heart healthy/low-sodium diet, continue daily weights and adequate hydration. -Overall heart failure appears to be compensated.

## 2022-06-19 NOTE — Progress Notes (Signed)
*  PRELIMINARY RESULTS* Echocardiogram Limited 2D Echocardiogram has been performed.  Elpidio Anis 06/19/2022, 1:17 PM

## 2022-06-19 NOTE — Progress Notes (Signed)
  Transition of Care [facility]) Screening Note   Patient Details  Name: Maria Fry Date of Birth: 11-Jun-1951   Transition of Care [facility]) CM/SW Contact:    Iona Beard, [place] Phone Number: 06/19/2022, 11:37 AM    Transition of Care Department [facility]) has reviewed patient and no TOC needs have been identified at this time. We will continue to monitor patient advancement through interdisciplinary progression rounds. If new patient transition needs arise, please place a TOC consult.

## 2022-06-19 NOTE — Assessment & Plan Note (Addendum)
-  Currently chest pain-free -Symptoms most likely associated with arrhythmia at time of admission. -Continue to follow up with cardiology service as an outpatient. -Continue the use of a statins and is currently on Eliquis. -Continue beta-blocker.

## 2022-06-19 NOTE — Assessment & Plan Note (Addendum)
-  Continue Lipitor -Heart healthy diet discussed with patient.

## 2022-06-19 NOTE — Progress Notes (Signed)
Progress Note   Patient: Maria Fry LZJ:[id number] DOB: Mar 05, 1951 DOA: 06/18/2022     0 DOS: the patient was seen and examined on 06/19/2022   Brief hospital admission course: As per H&P written by Dr. Josephine Cables on 06/18/2022 Maria Fry is a 71 y.o. female with medical history significant of hypertension, hyperlipidemia, hypothyroidism, CAD, chronic combined systolic and diastolic CHF, atrial fibrillation on Eliquis and stable angina on Ranexa who presents to the emergency department due to several days of onset of chest pain.  Chest pain was described as sharp and was below the breasts on both sides of the chest as well as left shoulder.  Chest pain occurs both at rest and on walking and the pain worsens on ambulation due to shortness of breath which has resulted in decreased exercise tolerance.  Chest pain was nonreproducible, though she referred to residual soreness due to the chest pain on palpation.  Pain was similar to her anginal pain.  She has been unable to refill her Ranexa since last week because the pharmacy ran out of stock, so she has been having the chest pain, she was started on Imdur yesterday by cardiologist, but pain continued and worsened today.  She states that she has been taking 1 nitroglycerin daily, but her cardiologist told that she cannot be taking nitroglycerin on daily basis and that she needs to go to the ED if chest pain persist.  She denies fever, chills, nausea, vomiting, abdominal pain, headache.  She denies use of alcohol, tobacco or any other recreational drug use.   ED Course:  In the emergency department, temperature was 98.28F, respiratory rate 23/min, pulse 108 bpm, BP 125/93, O2 sat 94%.  Work-up in the ED showed microcytic anemia, BNP 305 (this was 766 on 01/15/2022), troponin x2 was negative. Chest x-ray showed chronic cardiomegaly and aortic tortuosity.  No active disease Cardiologist at [facility] was consulted and recommended that patient can stay here at [facility]  and that cardiology will round on him in the morning.  Hospitalist was asked to admit patient for further evaluation and management.  Assessment and Plan: Insomnia - Continue the use of melatonin.  Elevated brain natriuretic peptide (BNP) level - Labile anticoagulation 3-5 -Continue heart healthy/low-sodium diet, continue daily weights and strict input and output. -Overall heart failure appears to be compensated.  Chest pain - Currently chest pain-free -Symptoms most likely associated with arrhythmia -Follow cardiology service recommendation -Continue the use of a statins and is currently on Eliquis. -Continue beta-blocker.  Constipation - Continue as needed laxative -Patient denies abdominal pain.  Acquired hypothyroidism - Continue Synthroid.  Atrial fibrillation, chronic (HCC) - Continue the use of Eliquis for secondary prevention and continue Tikosyn and beta-blocker -Follow cardiology service recommendation -Planning for cardioversion on 06/20/2022.  Microcytic anemia - MCV 76.9 -No signs of acute bleeding -Follow iron studies results.  Mixed hyperlipidemia - Continue Lipitor -Heart healthy diet discussed with patient.  Chronic combined systolic and diastolic heart failure (HCC) - Overall compensated -Continue to follow low-sodium diet, strict I's and O's and daily weights. -Patient denies increased in her abdominal girth or lower extremity swelling. -Follow cardiology service recommendations.  CAD (coronary artery disease) -Troponin negative x2 -No acute ischemic changes appreciated on EKG or telemetry; but positive atrial fibrillation with rate controlled. -Case discussed with cardiology service who feel patient's symptoms are associated with arrhythmia -Continue medication management, Eliquis and plans for cardioversion. -Follow 2D echo result  Essential hypertension - Soft, but stable vital signs -Patient  reports no lightheadedness or dizziness  sensation -Continue current antihypertensive agent -Continue to follow vital sign -Heart healthy diet discussed with patient.  Class II obesity -Low calorie diet, portion control and increase activity discussed with patient. -Body mass index is 37.91 kg/m.   Subjective:  Currently chest pain-free; reports not feeling well.  No nausea, no vomiting, no dysuria or hematuria.  Physical Exam: Vitals:   06/19/22 0934 06/19/22 1002 06/19/22 1130 06/19/22 1444  BP: (!) 110/57 124/68 116/60 (!) 103/48  Pulse: 73 74  74  Resp:  20  20  Temp:  97.6 F (36.4 C)  98 F (36.7 C)  TempSrc:  Oral  Oral  SpO2:  99%  98%  Weight:      Height:       General exam: Alert, awake, oriented x 3; in no major distress. Respiratory system: Good air movement bilaterally.  Respiratory effort normal.  No using accessory muscle. Cardiovascular system: Irregular, no rubs, unable to properly assess JVD with body habitus. Gastrointestinal system: Abdomen is obese, nondistended, soft and nontender. No organomegaly or masses felt. Normal bowel sounds heard. Central nervous system: Alert and oriented. No focal neurological deficits. Extremities: No cyanosis or clubbing. Skin: No petechiae. Psychiatry: Judgement and insight appear normal. Mood & affect appropriate.   Data Reviewed: 2D echo: Pending Comprehensive metabolic panel: Demonstrating sodium 137, potassium 4.2, chloride 104, bicarb 26, BUN 20, creatinine 1.05 and normal LFTs. CBC: White blood cells 9.7, hemoglobin 9.5, MCV 77.3, platelet count 382 K Magnesium: 2.4 Phosphorus: 3.1 TSH: Pending   Family Communication: Daughter at bedside.  Disposition: Status is: Observation The patient remains OBS appropriate and will d/c before 2 midnights.   Planned Discharge Destination: Home   Author: Barton Dubois, MD 06/19/2022 4:08 PM  For on call review [email or web address].

## 2022-06-19 NOTE — Assessment & Plan Note (Addendum)
-  Continue Synthroid -TSH within normal limits. 

## 2022-06-19 NOTE — Assessment & Plan Note (Addendum)
-  Continue the use of melatonin.

## 2022-06-19 NOTE — Assessment & Plan Note (Addendum)
-  Troponin negative x2 -No acute ischemic changes appreciated on EKG or telemetry; but positive atrial fibrillation with rate controlled. -Case discussed with cardiology service who feel patient's symptoms are associated with arrhythmia -Continue medication management, and outpatient follow-up with cardiology service. -2D echo demonstrating no wall motion abnormalities. -Patient is not on aspirin as she is chronically on Eliquis.

## 2022-06-19 NOTE — Assessment & Plan Note (Addendum)
-  Overall compensated -Continue to follow low-sodium diet, adequate hydration and follow daily weights.   -Patient denies increased in her abdominal girth or lower extremity swelling. -Continue to follow cardiology service recommendations. -Patient's daily diuretics dose has been adjusted to 40 mg daily at this point. -Ejection fraction repeat echo has demonstrated improvement of her EF to 55%. -Continue patient follow-up with cardiology service.

## 2022-06-19 NOTE — Assessment & Plan Note (Addendum)
-  Continue as needed laxative. -Patient denies abdominal pain.

## 2022-06-19 NOTE — Assessment & Plan Note (Addendum)
-  stable vital signs -Patient reports no lightheadedness or dizziness sensation -Continue at discharge adjusted dose of Aldactone, amlodipine, metoprolol and Lasix. -Continue patient follow-up with cardiology service. -Imdur has been discontinued. -Heart healthy diet discussed with patient.

## 2022-06-20 ENCOUNTER — Observation Stay (HOSPITAL_BASED_OUTPATIENT_CLINIC_OR_DEPARTMENT_OTHER): Payer: Medicare Other | Admitting: Certified Registered Nurse Anesthetist

## 2022-06-20 ENCOUNTER — Other Ambulatory Visit: Payer: Self-pay

## 2022-06-20 ENCOUNTER — Encounter (HOSPITAL_COMMUNITY): Payer: Self-pay | Admitting: Internal Medicine

## 2022-06-20 ENCOUNTER — Observation Stay (HOSPITAL_COMMUNITY): Payer: Medicare Other | Admitting: Certified Registered Nurse Anesthetist

## 2022-06-20 ENCOUNTER — Encounter (HOSPITAL_COMMUNITY)
Admission: EM | Disposition: A | Discharge status: Home / Self Care | Payer: Self-pay | Source: Home / Self Care | Attending: Emergency Medicine

## 2022-06-20 DIAGNOSIS — Z79899 Other long term (current) drug therapy: Secondary | ICD-10-CM | POA: Diagnosis not present

## 2022-06-20 DIAGNOSIS — I25119 Atherosclerotic heart disease of native coronary artery with unspecified angina pectoris: Secondary | ICD-10-CM | POA: Diagnosis not present

## 2022-06-20 DIAGNOSIS — I5042 Chronic combined systolic (congestive) and diastolic (congestive) heart failure: Secondary | ICD-10-CM | POA: Diagnosis not present

## 2022-06-20 DIAGNOSIS — I4891 Unspecified atrial fibrillation: Secondary | ICD-10-CM | POA: Diagnosis not present

## 2022-06-20 DIAGNOSIS — I110 Hypertensive heart disease with heart failure: Secondary | ICD-10-CM

## 2022-06-20 DIAGNOSIS — E039 Hypothyroidism, unspecified: Secondary | ICD-10-CM | POA: Diagnosis not present

## 2022-06-20 DIAGNOSIS — I482 Chronic atrial fibrillation, unspecified: Secondary | ICD-10-CM | POA: Diagnosis not present

## 2022-06-20 DIAGNOSIS — I25112 Atherosclerosic heart disease of native coronary artery with refractory angina pectoris: Secondary | ICD-10-CM | POA: Diagnosis not present

## 2022-06-20 DIAGNOSIS — I4819 Other persistent atrial fibrillation: Secondary | ICD-10-CM

## 2022-06-20 DIAGNOSIS — R079 Chest pain, unspecified: Secondary | ICD-10-CM | POA: Diagnosis not present

## 2022-06-20 DIAGNOSIS — I509 Heart failure, unspecified: Secondary | ICD-10-CM | POA: Diagnosis not present

## 2022-06-20 DIAGNOSIS — D649 Anemia, unspecified: Secondary | ICD-10-CM | POA: Diagnosis not present

## 2022-06-20 DIAGNOSIS — I259 Chronic ischemic heart disease, unspecified: Secondary | ICD-10-CM | POA: Diagnosis not present

## 2022-06-20 DIAGNOSIS — Z7901 Long term (current) use of anticoagulants: Secondary | ICD-10-CM | POA: Diagnosis not present

## 2022-06-20 DIAGNOSIS — Z87891 Personal history of nicotine dependence: Secondary | ICD-10-CM

## 2022-06-20 HISTORY — PX: CARDIOVERSION: SHX1299

## 2022-06-20 LAB — TSH: TSH: 3.676 u[IU]/mL (ref 0.350–4.500)

## 2022-06-20 SURGERY — CARDIOVERSION
Anesthesia: General

## 2022-06-20 MED ORDER — LIDOCAINE HCL (CARDIAC) PF 100 MG/5ML IV SOSY
PREFILLED_SYRINGE | INTRAVENOUS | Status: DC | PRN
  Administered 2022-06-20: 50 mg via INTRAVENOUS

## 2022-06-20 MED ORDER — FERROUS SULFATE 325 (65 FE) MG PO TABS
325.0000 mg | ORAL_TABLET | Freq: Every day | ORAL | Status: DC
  Administered 2022-06-21: 325 mg via ORAL
  Filled 2022-06-20: qty 1

## 2022-06-20 MED ORDER — ORAL CARE MOUTH RINSE: 15.0000 mL | Freq: Once | OROMUCOSAL | Status: AC

## 2022-06-20 MED ORDER — CHLORHEXIDINE GLUCONATE 0.12 % MT SOLN
15.0000 mL | Freq: Once | OROMUCOSAL | Status: AC
Start: 2022-06-20 — End: 2022-06-20
  Administered 2022-06-20: 15 mL via OROMUCOSAL
  Filled 2022-06-20: qty 15

## 2022-06-20 MED ORDER — PROPOFOL 10 MG/ML IV BOLUS
INTRAVENOUS | Status: DC | PRN
  Administered 2022-06-20: 60 mg via INTRAVENOUS

## 2022-06-20 MED ORDER — METOPROLOL TARTRATE 25 MG PO TABS
25.0000 mg | ORAL_TABLET | Freq: Two times a day (BID) | ORAL | Status: DC
  Administered 2022-06-20 – 2022-06-21 (×2): 25 mg via ORAL
  Filled 2022-06-20 (×2): qty 1

## 2022-06-20 MED ORDER — LACTATED RINGERS IV SOLN
INTRAVENOUS | Status: DC
Start: 2022-06-20 — End: 2022-06-20

## 2022-06-20 NOTE — Care Management Obs Status (Signed)
[facility] NOTIFICATION   Patient Details  Name: Maria Fry MRN: [id number] Date of Birth: 01-21-1951   Medicare Observation Status Notification Given:  Yes    Tommy Medal 06/20/2022, 9:15 AM

## 2022-06-20 NOTE — Anesthesia Preprocedure Evaluation (Signed)
Anesthesia Evaluation  Patient identified by MRN, date of birth, ID band Patient awake    Reviewed: Allergy & Precautions, NPO status , Patient's Chart, lab work & pertinent test results  Airway Mallampati: II  TM Distance: >3 FB Neck ROM: Full    Dental  (+) Dental Advisory Given, Edentulous Upper, Missing   Pulmonary sleep apnea and Continuous Positive Airway Pressure Ventilation , former smoker,    Pulmonary exam normal breath sounds clear to auscultation       Cardiovascular Exercise Tolerance: Good hypertension, Pt. on medications + angina + CAD, + Past MI and +CHF  + dysrhythmias Atrial Fibrillation  Rhythm:Irregular Rate:Tachycardia  1. Left ventricular ejection fraction, by estimation, is 55%. The left  ventricle has normal function. Left ventricular endocardial border not  optimally defined to evaluate regional wall motion. There is severe left  ventricular hypertrophy.  2. Right ventricular systolic function is normal. The right ventricular  size is normal.  3. The aortic valve is tricuspid. There is mild calcification of the  aortic valve. There is mild thickening of the aortic valve.  4. IVC is small suggesting low RA pressure and hypovolemia.  5. Limited echo to evaluate LV function    Neuro/Psych negative neurological ROS  negative psych ROS   GI/Hepatic Neg liver ROS, GERD  Medicated,  Endo/Other  Hypothyroidism   Renal/GU negative Renal ROS  negative genitourinary   Musculoskeletal  (+) Arthritis , Osteoarthritis,    Abdominal   Peds negative pediatric ROS (+)  Hematology  (+) Blood dyscrasia, anemia ,   Anesthesia Other Findings   Reproductive/Obstetrics negative OB ROS                            Anesthesia Physical Anesthesia Plan  ASA: 3  Anesthesia Plan: General   Post-op Pain Management: Minimal or no pain anticipated   Induction: Intravenous  PONV  Risk Score and Plan: Propofol infusion  Airway Management Planned: Nasal Cannula and Natural Airway  Additional Equipment:   Intra-op Plan:   Post-operative Plan:   Informed Consent: I have reviewed the patients History and Physical, chart, labs and discussed the procedure including the risks, benefits and alternatives for the proposed anesthesia with the patient or authorized representative who has indicated his/her understanding and acceptance.     Dental advisory given  Plan Discussed with: CRNA and Surgeon  Anesthesia Plan Comments:         Anesthesia Quick Evaluation

## 2022-06-20 NOTE — Progress Notes (Signed)
Progress Note   Patient: Maria Fry ZDG:[id number] DOB: [birth year] DOA: 06/18/2022     0 DOS: the patient was seen and examined on 06/20/2022   Brief hospital admission course: As per H&P written by Dr. Josephine Cables on 06/18/2022 Maria Fry is a 71 y.o. female with medical history significant of hypertension, hyperlipidemia, hypothyroidism, CAD, chronic combined systolic and diastolic CHF, atrial fibrillation on Eliquis and stable angina on Ranexa who presents to the emergency department due to several days of onset of chest pain.  Chest pain was described as sharp and was below the breasts on both sides of the chest as well as left shoulder.  Chest pain occurs both at rest and on walking and the pain worsens on ambulation due to shortness of breath which has resulted in decreased exercise tolerance.  Chest pain was nonreproducible, though she referred to residual soreness due to the chest pain on palpation.  Pain was similar to her anginal pain.  She has been unable to refill her Ranexa since last week because the pharmacy ran out of stock, so she has been having the chest pain, she was started on Imdur yesterday by cardiologist, but pain continued and worsened today.  She states that she has been taking 1 nitroglycerin daily, but her cardiologist told that she cannot be taking nitroglycerin on daily basis and that she needs to go to the ED if chest pain persist.  She denies fever, chills, nausea, vomiting, abdominal pain, headache.  She denies use of alcohol, tobacco or any other recreational drug use.   ED Course:  In the emergency department, temperature was 98.28F, respiratory rate 23/min, pulse 108 bpm, BP 125/93, O2 sat 94%.  Work-up in the ED showed microcytic anemia, BNP 305 (this was 766 on 01/15/2022), troponin x2 was negative. Chest x-ray showed chronic cardiomegaly and aortic tortuosity.  No active disease Cardiologist at [facility] was consulted and recommended that patient can stay here at [facility]  and that cardiology will round on him in the morning.  Hospitalist was asked to admit patient for further evaluation and management.  Assessment and Plan: Insomnia -Continue the use of melatonin.  Elevated brain natriuretic peptide (BNP) level -Continue heart healthy/low-sodium diet, continue daily weights and strict input and output. -Overall heart failure appears to be compensated.  Chest pain -Currently chest pain-free -Symptoms most likely associated with arrhythmia at time of admission. -Continue to follow cardiology service recommendation -Continue the use of a statins and is currently on Eliquis. -Continue beta-blocker.  Constipation -Continue as needed laxative. -Patient denies abdominal pain.  Acquired hypothyroidism -Continue Synthroid. -TSH within normal limits.  Atrial fibrillation, chronic (HCC) -Continue the use of Eliquis for secondary prevention and continue Tikosyn and beta-blocker -Follow cardiology service recommendation -Planning for cardioversion later today 06/20/2022.  Microcytic anemia -MCV 76.9 -No signs of acute bleeding -Follow iron studies results.  Mixed hyperlipidemia -Continue Lipitor -Heart healthy diet discussed with patient.  Chronic combined systolic and diastolic heart failure (HCC) -Overall compensated -Continue to follow low-sodium diet, strict I's and O's and daily weights. -Patient denies increased in her abdominal girth or lower extremity swelling. -Continue to follow cardiology service recommendations.  CAD (coronary artery disease) -Troponin negative x2 -No acute ischemic changes appreciated on EKG or telemetry; but positive atrial fibrillation with rate controlled. -Case discussed with cardiology service who feel patient's symptoms are associated with arrhythmia -Continue medication management, Eliquis and plans for cardioversion. -2D echo demonstrating no wall motion abnormalities.  Essential hypertension - Soft, but  stable vital signs -Patient reports no lightheadedness or dizziness sensation -Continue current antihypertensive agent -Continue to follow vital sign -Heart healthy diet discussed with patient.  Class II obesity -Low calorie diet, portion control and increase activity discussed with patient. -Body mass index is 37.92 kg/m.   Subjective:  No chest pain, no nausea, no vomiting.  Patient reports no palpitations.  She is afebrile and otherwise in no acute distress.  Waiting for cardioversion.  Physical Exam: Vitals:   06/20/22 1415 06/20/22 1430 06/20/22 1445 06/20/22 1513  BP: (!) 106/54 (!) 108/58 105/60 (!) 100/49  Pulse: (!) 57 (!) 56 (!) 55 (!) 59  Resp: '17 18 12 16  '$ Temp:    98 F (36.7 C)  TempSrc:    Oral  SpO2: 97% 96% 99% 99%  Weight:      Height:       General exam: Alert, awake, oriented x 3; patient reports no chest pain or palpitations.  Expressed no shortness of breath and otherwise feeling good. Respiratory system: Clear to auscultation. Respiratory effort normal.  No using accessory muscles. Cardiovascular system: Rate controlled, irregular rhythm; no rubs, no gallops, no JVD on exam. Gastrointestinal system: Abdomen is obese, nondistended, soft and nontender. No organomegaly or masses felt. Normal bowel sounds heard. Central nervous system: Alert and oriented. No focal neurological deficits. Extremities: No cyanosis or clubbing. Skin: No petechiae. Psychiatry: Judgement and insight appear normal. Mood & affect appropriate.   Data Reviewed: 2D echo:   1. Left ventricular ejection fraction, by estimation, is 55%. The left  ventricle has normal function. Left ventricular endocardial border not  optimally defined to evaluate regional wall motion. There is severe left  ventricular hypertrophy.   2. Right ventricular systolic function is normal. The right ventricular  size is normal.   3. The aortic valve is tricuspid. There is mild calcification of the  aortic  valve. There is mild thickening of the aortic valve.   4. IVC is small suggesting low RA pressure and hypovolemia.   5. Limited echo to evaluate LV function  TSH: 3.676   Family Communication: Daughter at bedside.  Disposition: Status is: Observation The patient remains OBS appropriate and will d/c before 2 midnights.   Planned Discharge Destination: Home   Author: Barton Dubois, MD 06/20/2022 5:17 PM  For on call review [email or web address].

## 2022-06-20 NOTE — Progress Notes (Signed)
Electrical Cardioversion Procedure Note TYCHELLE PURKEY [id number] [birth year]  Procedure: Electrical Cardioversion Indications:  Atrial Fibrillation  Procedure Details Consent: Risks of procedure as well as the alternatives and risks of each were explained to the (patient/caregiver).  Consent for procedure obtained. Time Out: Verified patient identification, verified procedure, site/side was marked, verified correct patient position, special equipment/implants available, medications/allergies/relevent history reviewed, required imaging and test results available.  Performed  Patient placed on cardiac monitor, pulse oximetry, supplemental oxygen as necessary.  Sedation given: Short-acting barbiturates given by CRNA Pacer pads placed anterior and posterior chest. Placed and confirmed by Dr.Branch.  Cardioverted 1 time(s).  Cardioverted at [facility] [place].  Evaluation Findings: Post procedure EKG shows: NSR Complications: None Patient did tolerate procedure well.   Kandis Mannan 06/20/2022, 2:43 PM

## 2022-06-20 NOTE — Transfer of Care (Signed)
Immediate Anesthesia Transfer of Care Note  Patient: Maria Fry  Procedure(s) Performed: CARDIOVERSION  Patient Location: PACU  Anesthesia Type:MAC  Level of Consciousness: awake  Airway & Oxygen Therapy: Patient Spontanous Breathing and Patient connected to nasal cannula oxygen  Post-op Assessment: Report given to RN and Post -op Vital signs reviewed and stable  Post vital signs: Reviewed and stable  Last Vitals:  Vitals Value Taken Time  BP    Temp    Pulse    Resp    SpO2      Last Pain:  Vitals:   06/20/22 1306  TempSrc: Oral  PainSc: 0-No pain      Patients Stated Pain Goal: 5 (14/48/18 5631)  Complications: No notable events documented.

## 2022-06-20 NOTE — Progress Notes (Addendum)
Progress Note  Patient Name: Maria Fry Date of Encounter: 06/20/2022  [facility] Cardiologist: Rozann Lesches, MD   Subjective   Feels much better as compared to prior to admission. No recurrent chest pain. Breathing at baseline. She has been NPO since midnight for planned DCCV today.   Inpatient Medications    Scheduled Meds:  amLODipine  2.5 mg Oral Daily   apixaban  5 mg Oral BID   atorvastatin  80 mg Oral QHS   dofetilide  125 mcg Oral BID   furosemide  40 mg Oral Daily   levothyroxine  100 mcg Oral QAC breakfast   melatonin  3 mg Oral QHS   metoprolol tartrate  25 mg Oral BID   spironolactone  12.5 mg Oral Daily   Continuous Infusions:  PRN Meds:    Vital Signs    Vitals:   06/19/22 1444 06/19/22 2042 06/20/22 0449 06/20/22 0600  BP: (!) 103/48 106/62 102/61   Pulse: 74 62 60   Resp: '20 20 18   '$ Temp: 98 F (36.7 C) 98.3 F (36.8 C) 98.2 F (36.8 C)   TempSrc: Oral Oral    SpO2: 98% 98% 98%   Weight:    97.1 kg  Height:       No intake or output data in the 24 hours ending 06/20/22 0748    06/20/2022    6:00 AM 06/19/2022    3:13 AM 05/10/2022    9:45 AM  Last 3 Weights  Weight (lbs) 214 lb 1.1 oz 214 lb 222 lb  Weight (kg) 97.1 kg 97.07 kg 100.699 kg      Telemetry    Atrial fibrillation, HR in 60's to 70's with occasional PVC's. Pause up to 2.4 seconds overnight. - Personally Reviewed  ECG    Rate-controlled atrial fibrillation, HR 70 with PVC's.  - Personally Reviewed  Physical Exam   GEN: Pleasant female appearing in no acute distress.   Neck: No JVD Cardiac: Irregularly irregular, no murmurs, rubs, or gallops.  Respiratory: Clear to auscultation bilaterally. GI: Soft, nontender, non-distended  MS: Trace ankle edema bilaterally; No deformity. Neuro:  Nonfocal  Psych: Normal affect   Labs    High Sensitivity Troponin:   Recent Labs  Lab 06/18/22 1806 06/18/22 2119  TROPONINIHS 10 8     Chemistry Recent Labs  Lab  06/18/22 1806 06/19/22 0524  NA 137 137  K 4.7 4.2  CL 104 104  CO2 26 26  GLUCOSE 117* 96  BUN 18 20  CREATININE 1.26* 1.05*  CALCIUM 8.5* 8.6*  MG  --  2.4  PROT  --  7.9  ALBUMIN  --  3.4*  AST  --  16  ALT  --  11  ALKPHOS  --  72  BILITOT  --  0.5  GFRNONAA 46* 58*  ANIONGAP 7 7    Lipids No results for input(s): "CHOL", "TRIG", "HDL", "LABVLDL", "LDLCALC", "CHOLHDL" in the last 168 hours.  Hematology Recent Labs  Lab 06/18/22 1806 06/19/22 0524  WBC 9.3 9.7  RBC 3.90 3.65*  HGB 9.2* 8.5*  HCT 30.0* 28.2*  MCV 76.9* 77.3*  MCH 23.6* 23.3*  MCHC 30.7 30.1  RDW 22.3* 22.0*  PLT 443* 382   Thyroid  Recent Labs  Lab 06/18/22 1806  TSH 3.676    BNP Recent Labs  Lab 06/18/22 1806  BNP 305.0*    DDimer No results for input(s): "DDIMER" in the last 168 hours.   Radiology  DG Chest 2 View  Result Date: 06/18/2022 CLINICAL DATA:  Palpitations.  Shortness of breath. EXAM: CHEST - 2 VIEW COMPARISON:  01/15/2022 FINDINGS: Chronic cardiomegaly. Chronic tortuous aorta. The lungs are clear. No edema, infiltrate, collapse or effusion. No acute bone finding. IMPRESSION: Chronic cardiomegaly and aortic tortuosity.  No active disease. Electronically Signed   By: Nelson Chimes M.D.   On: 06/18/2022 17:37    Cardiac Studies   Limited Echo: 06/19/2022 IMPRESSIONS     1. Left ventricular ejection fraction, by estimation, is 55%. The left  ventricle has normal function. Left ventricular endocardial border not  optimally defined to evaluate regional wall motion. There is severe left  ventricular hypertrophy.   2. Right ventricular systolic function is normal. The right ventricular  size is normal.   3. The aortic valve is tricuspid. There is mild calcification of the  aortic valve. There is mild thickening of the aortic valve.   4. IVC is small suggesting low RA pressure and hypovolemia.   5. Limited echo to evaluate LV function   Patient Profile     71 y.o.  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 Found to be in recurrent atrial fibrillation.   Assessment & Plan    1. Paroxysmal Atrial Fibrillation - She has been on Tikosyn since 10/2020 and by review of notes and EKGs has presumably maintained normal sinus rhythm since. Was in NSR last month but back in atrial fibrillation this admission and she reports fatigue and presyncope which resemble her prior symptoms.  - Given she has not missed any doses of anticoagulation, Dr. Harl Bowie has recommended a DCCV which is scheduled for later today. She has been NPO since midnight.  - Continue Eliquis 5 mg twice daily, Tikosyn 125 mcg twice daily and Lopressor 25 mg twice daily (will hold AM dose of Lopressor prior to her procedure).    2. CAD/Chest Pain - She had an NSTEMI in 2016 with cath showing 100% distal LAD occlusion and NST in 2020 showed a small scar in the inferior wall with peri-infarct ischemia. Hs Troponin values negative this admission.  - Was doing well until unable to obtain brand-name Ranexa and prior allergy to Ranolazine. She was intolerant to Imdur as she had fatigue and dizziness with this. Has been started on Amlodipine 2.'5mg'$  daily for anti-anginal benefit this admission. Continue Lipitor and Lopressor. Not on ASA given the need for Eliquis.    3. HFmrEF - Her EF was at 50-55% in 01/2021, at 45-50% in 01/2022. Repeat echo this admission shows her EF has improved to 55%. IVC was small suggesting low RA pressure and hypovolemia and Lasix has been reduced from '40mg'$  BID to '40mg'$  daily. Continue Lopressor and Spironolactone. Previously intolerant to Coreg.    4. HTN  - BP has been  low-normal at 102/61 - 124/68 within the past 24 hours. Remains on Amlodipine 2.'5mg'$  daily, Lasix '40mg'$  daily, Spironolactone 12.'5mg'$  daily and Lopressor '25mg'$  BID.    5. HLD - FLP in 03/2022 showed her LDL was at 63. Continue Atorvastatin '80mg'$  daily.    6. Mitral Regurgitation - This was moderate by echo in 01/2022. Follow as an outpatient.  7. Pulmonary HTN - Echo in 01/2022 showed low-normal RV function and severely elevated PASP of 67 mmHg. Limited this admission shows normal RV function. Prior sleep study showed only mild OSA. Follow as an outpatient and can consider a RHC in the future if indicated.   8. Anemia - Hemoglobin was at 9.2 on admission and declined to 8.5 yesterday. Ferritin low at 4. Will reorder her PTA iron as she has not been receiving this. Management per the admitting team.   For questions or updates, please contact [place] Please consult [email or web address] for contact info under        Signed, Erma Heritage, PA-C  06/20/2022, 7:48 AM    Attending note Patient seen and discussed with PA Ahmed Prima, I agree with her documentation. Admitted with fatigue, SOB, similar to when she was in afib in the past and also chest pain similar to her prior angina that started when she was not able to get her specific manufaturer of ranexa that is no longer available in the [place]. No evidence of ACS. SIde effects on imdur as alternative antianginal, trying low dose norvasc. Plans for DCCV today for recurrent afib, first recurrence in over a year and a half on tikosyn, plan for DCCV today and would continue her chronic regimen. If further recurrence would have to reassess continued use of dofetilide.   Carlyle Dolly MD

## 2022-06-20 NOTE — Progress Notes (Signed)
Pt arrived back to room #308 via WC from same day surgery s/p cardioversion. Pt A&O, denies c/o. Pt now in SR per central tele.

## 2022-06-20 NOTE — Progress Notes (Signed)
Pt off unit for procedure.

## 2022-06-20 NOTE — Anesthesia Postprocedure Evaluation (Signed)
Anesthesia Post Note  Patient: [name]  Procedure(s) Performed: CARDIOVERSION  Patient location during evaluation: Phase II Anesthesia Type: General Level of consciousness: awake and alert and oriented Pain management: pain level controlled Vital Signs Assessment: post-procedure vital signs reviewed and stable Respiratory status: spontaneous breathing, nonlabored ventilation and respiratory function stable Cardiovascular status: blood pressure returned to baseline and stable Postop Assessment: no apparent nausea or vomiting Anesthetic complications: no   No notable events documented.   Last Vitals:  Vitals:   06/20/22 1306 06/20/22 1353  BP: (!) 116/58 (!) 95/48  Pulse: 64 (!) 59  Resp: 18 20  Temp: 36.8 C 36.8 C  SpO2: 100% 100%    Last Pain:  Vitals:   06/20/22 1353  TempSrc:   PainSc: Asleep                 Matteson Blue C Tongela Encinas

## 2022-06-20 NOTE — CV Procedure (Signed)
CV Procedure Note  Procedure: Electrical cardioversion Physician: Dr Carlyle Dolly MD Indication: persistent atrial fibrillation   Patient was brought to the procedure suite after appropriate consent was obtained. Defib pads were placed in the anterior and posterior positions. Sedation was achieved with the assistance of anesthesiology, for details please refer to there documentation. Succesfully converted from afib to sinus rhythm with a single synchronized 200J shock. Cardiopulmonary monitoring performed throughout the procedure, she tolerated well without complications   Carlyle Dolly MD

## 2022-06-21 DIAGNOSIS — I202 Refractory angina pectoris: Secondary | ICD-10-CM

## 2022-06-21 DIAGNOSIS — I482 Chronic atrial fibrillation, unspecified: Secondary | ICD-10-CM | POA: Diagnosis not present

## 2022-06-21 DIAGNOSIS — E039 Hypothyroidism, unspecified: Secondary | ICD-10-CM | POA: Diagnosis not present

## 2022-06-21 DIAGNOSIS — I5042 Chronic combined systolic (congestive) and diastolic (congestive) heart failure: Secondary | ICD-10-CM | POA: Diagnosis not present

## 2022-06-21 DIAGNOSIS — I2510 Atherosclerotic heart disease of native coronary artery without angina pectoris: Secondary | ICD-10-CM | POA: Diagnosis not present

## 2022-06-21 MED ORDER — AMLODIPINE BESYLATE 2.5 MG PO TABS: 2.5000 mg | ORAL_TABLET | Freq: Every day | ORAL | 1 refills | Status: DC

## 2022-06-21 MED ORDER — FUROSEMIDE 40 MG PO TABS: 40.0000 mg | ORAL_TABLET | Freq: Every day | ORAL | Status: DC

## 2022-06-21 MED ORDER — POTASSIUM CHLORIDE CRYS ER 20 MEQ PO TBCR: 40.0000 meq | EXTENDED_RELEASE_TABLET | Freq: Every day | ORAL | Status: DC

## 2022-06-21 NOTE — Evaluation (Signed)
Physical Therapy Evaluation Patient Details Name: Maria Fry MRN: [id number] DOB: [birth year] Today's Date: 06/21/2022  History of Present Illness  Maria Fry is a 71 y.o. female with medical history significant of hypertension, hyperlipidemia, hypothyroidism, CAD, chronic combined systolic and diastolic CHF, atrial fibrillation on Eliquis and stable angina on Ranexa who presents to the emergency department due to several days of onset of chest pain.  Chest pain was described as sharp and was below the breasts on both sides of the chest as well as left shoulder.  Chest pain occurs both at rest and on walking and the pain worsens on ambulation due to shortness of breath which has resulted in decreased exercise tolerance.  Chest pain was nonreproducible, though she referred to residual soreness due to the chest pain on palpation.  Pain was similar to her anginal pain.  She has been unable to refill her Ranexa since last week because the pharmacy ran out of stock, so she has been having the chest pain, she was started on Imdur yesterday by cardiologist, but pain continued and worsened today.  She states that she has been taking 1 nitroglycerin daily, but her cardiologist told that she cannot be taking nitroglycerin on daily basis and that she needs to go to the ED if chest pain persist.  She denies fever, chills, nausea, vomiting, abdominal pain, headache.  She denies use of alcohol, tobacco or any other recreational drug use.   Clinical Impression  Patient had just finished ambulating with nursing but was agreeable to participate. Patient able to ambulate without AD with slow cadence but without loss of balance. Patient limited by impaired activity tolerance and fatigue. Patient would benefit from outpatient PT to improve functional strength and endurance. Patient does not require additional acute PT services and will likely d/c later today.      Recommendations for follow up therapy are one component of  a multi-disciplinary discharge planning process, led by the attending physician.  Recommendations may be updated based on patient status, additional functional criteria and insurance authorization.  Follow Up Recommendations Outpatient PT      Assistance Recommended at Discharge None  Patient can return home with the following  Assistance with cooking/housework;Help with stairs or ramp for entrance    Equipment Recommendations None recommended by PT  Recommendations for Other Services       Functional Status Assessment Patient has had a recent decline in their functional status and demonstrates the ability to make significant improvements in function in a reasonable and predictable amount of time.     Precautions / Restrictions Precautions Precautions: Fall Restrictions Weight Bearing Restrictions: No      Mobility  Bed Mobility               General bed mobility comments: seated EOB    Transfers Overall transfer level: Independent Equipment used: None                    Ambulation/Gait Ambulation/Gait assistance: Modified independent (Device/Increase time) Gait Distance (Feet): 80 Feet Assistive device: None Gait Pattern/deviations: Step-through pattern, Wide base of support, Decreased stride length       General Gait Details: ambulates with decreased cadence  Stairs            Wheelchair Mobility    Modified Rankin (Stroke Patients Only)       Balance Overall balance assessment: Mild deficits observed, not formally tested  Pertinent Vitals/Pain Pain Assessment Pain Assessment: No/denies pain    Home Living Family/patient expects to be discharged to:: Private residence Living Arrangements: Spouse/significant other Available Help at Discharge: Family             Home Equipment: Kasandra Knudsen - single point      Prior Function Prior Level of Function : Independent/Modified  Independent             Mobility Comments: ambulates without AD, SPC if needed ADLs Comments: independent     Hand Dominance        Extremity/Trunk Assessment   Upper Extremity Assessment Upper Extremity Assessment: Generalized weakness    Lower Extremity Assessment Lower Extremity Assessment: Generalized weakness    Cervical / Trunk Assessment Cervical / Trunk Assessment: Normal  Communication   Communication: No difficulties  Cognition Arousal/Alertness: Awake/alert Behavior During Therapy: WFL for tasks assessed/performed Overall Cognitive Status: Within Functional Limits for tasks assessed                                          General Comments      Exercises     Assessment/Plan    PT Assessment All further PT needs can be met in the next venue of care  PT Problem List Decreased strength;Decreased mobility       PT Treatment Interventions      PT Goals (Current goals can be found in the Care Plan section)  Acute Rehab PT Goals Patient Stated Goal: return home PT Goal Formulation: With patient Time For Goal Achievement: 06/21/22 Potential to Achieve Goals: Good    Frequency       Co-evaluation               AM-PAC PT "6 Clicks" Mobility  Outcome Measure Help needed turning from your back to your side while in a flat bed without using bedrails?: None Help needed moving from lying on your back to sitting on the side of a flat bed without using bedrails?: None Help needed moving to and from a bed to a chair (including a wheelchair)?: None Help needed standing up from a chair using your arms (e.g., wheelchair or bedside chair)?: None Help needed to walk in hospital room?: A Little Help needed climbing 3-5 steps with a railing? : A Little 6 Click Score: 22    End of Session   Activity Tolerance: Patient tolerated treatment well Patient left: in bed;with call bell/phone within reach;with family/visitor present Nurse  Communication: Mobility status PT Visit Diagnosis: Unsteadiness on feet (R26.81);Other abnormalities of gait and mobility (R26.89)    Time: 6895-7022 PT Time Calculation (min) (ACUTE ONLY): 6 min   Charges:   PT Evaluation $PT Eval Low Complexity: 1 Low          12:32 PM, 06/21/22 Mearl Latin PT, DPT Physical Therapist at [facility]

## 2022-06-21 NOTE — Progress Notes (Signed)
Progress Note  Patient Name: Maria Fry Date of Encounter: 06/21/2022  [facility] Cardiologist: Rozann Lesches, MD   Subjective   No chest pain. No recurrent palpitations. Has maintained NSR since her DCCV yesterday. Feels weak and says she has not been out of bed frequently since admission.   Inpatient Medications    Scheduled Meds:  amLODipine  2.5 mg Oral Daily   apixaban  5 mg Oral BID   atorvastatin  80 mg Oral QHS   dofetilide  125 mcg Oral BID   ferrous sulfate  325 mg Oral Daily   furosemide  40 mg Oral Daily   levothyroxine  100 mcg Oral QAC breakfast   melatonin  3 mg Oral QHS   metoprolol tartrate  25 mg Oral BID   spironolactone  12.5 mg Oral Daily   Continuous Infusions:  PRN Meds:    Vital Signs    Vitals:   06/20/22 1700 06/20/22 2121 06/20/22 2153 06/21/22 0448  BP: 113/62 (!) 95/30 (!) 128/36 (!) 141/56  Pulse: 68 73 73 62  Resp: '17 18  18  '$ Temp: 98 F (36.7 C) 98.4 F (36.9 C)  98.1 F (36.7 C)  TempSrc: Oral Oral    SpO2: 100% 98%  100%  Weight:      Height:        Intake/Output Summary (Last 24 hours) at 06/21/2022 0819 Last data filed at 06/20/2022 1836 Gross per 24 hour  Intake 506.67 ml  Output 400 ml  Net 106.67 ml      06/20/2022    6:00 AM 06/19/2022    3:13 AM 05/10/2022    9:45 AM  Last 3 Weights  Weight (lbs) 214 lb 1.1 oz 214 lb 222 lb  Weight (kg) 97.1 kg 97.07 kg 100.699 kg      Telemetry    NSR with episodes of sinus bradycardia, HR in 50's to 60's.  - Personally Reviewed  ECG    Sinus bradycardia, HR 59 with PAC's and nonspecific ST abnormalities along the lateral leads.  - Personally Reviewed  Physical Exam   GEN: Pleasant female appearing in no acute distress.   Neck: No JVD Cardiac: RRR, no murmurs, rubs, or gallops.  Respiratory: Clear to auscultation bilaterally. GI: Soft, nontender, non-distended  MS: Trace ankle edema bilaterally; No deformity. Neuro:  Nonfocal  Psych: Normal affect   Labs     High Sensitivity Troponin:   Recent Labs  Lab 06/18/22 1806 06/18/22 2119  TROPONINIHS 10 8     Chemistry Recent Labs  Lab 06/18/22 1806 06/19/22 0524  NA 137 137  K 4.7 4.2  CL 104 104  CO2 26 26  GLUCOSE 117* 96  BUN 18 20  CREATININE 1.26* 1.05*  CALCIUM 8.5* 8.6*  MG  --  2.4  PROT  --  7.9  ALBUMIN  --  3.4*  AST  --  16  ALT  --  11  ALKPHOS  --  72  BILITOT  --  0.5  GFRNONAA 46* 58*  ANIONGAP 7 7    Lipids No results for input(s): "CHOL", "TRIG", "HDL", "LABVLDL", "LDLCALC", "CHOLHDL" in the last 168 hours.  Hematology Recent Labs  Lab 06/18/22 1806 06/19/22 0524  WBC 9.3 9.7  RBC 3.90 3.65*  HGB 9.2* 8.5*  HCT 30.0* 28.2*  MCV 76.9* 77.3*  MCH 23.6* 23.3*  MCHC 30.7 30.1  RDW 22.3* 22.0*  PLT 443* 382   Thyroid  Recent Labs  Lab 06/18/22 1806  TSH 3.676    BNP Recent Labs  Lab 06/18/22 1806  BNP 305.0*    DDimer No results for input(s): "DDIMER" in the last 168 hours.    Cardiac Studies   Limited Echo: 06/19/2022 IMPRESSIONS     1. Left ventricular ejection fraction, by estimation, is 55%. The left  ventricle has normal function. Left ventricular endocardial border not  optimally defined to evaluate regional wall motion. There is severe left  ventricular hypertrophy.   2. Right ventricular systolic function is normal. The right ventricular  size is normal.   3. The aortic valve is tricuspid. There is mild calcification of the  aortic valve. There is mild thickening of the aortic valve.   4. IVC is small suggesting low RA pressure and hypovolemia.   5. Limited echo to evaluate LV function    Patient Profile     71 y.o. 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 Found to be in recurrent atrial fibrillation.    Assessment & Plan    1. Paroxysmal Atrial Fibrillation - Admitted with her first known recurrent episode of atrial fibrillation since 10/2020 and underwent successful DCCV yesterday with conversion to NSR following a 200 J shock. She has maintained NSR since with HR in the 50's to 60's. Remains on Tikosyn 125 mcg BID and Lopressor '25mg'$  BID.  - No reports of active bleeding. Does have known iron deficiency anemia. Continue Eliquis '5mg'$  BID for anticoagulation.   2. CAD/Chest Pain - Prior cath in 2016 showed a 100% distal LAD occlusion and she reported chest pain prior to admission but this was in the setting of her being off Ranexa as she is no longer able to obtain the brand name of this and was allergic to the generic. Hs Troponin values negative. Intolerant to Imdur. She has been started on Amlodipine 2.5 mg daily. Continue Lipitor and Lopressor. No ASA given the need for Eliquis.   3. HFimpEF - Her EF was previously at 45-50% in 01/2022, improved to 55% this admission. IVC was small this admission and Lasix has been reduced from '40mg'$  BID to '40mg'$  daily. Continue Lopressor and Spironolactone.   4. HLD - She has been continued on PTA Atorvastatin '80mg'$  daily. LDL was at 63 when checked in 03/2022.   5. Anemia - Hgb at 8.5 on most recent check and labs have been consistent with iron-deficiency anemia given Ferritin at 4. She has been restarted on PTA Iron supplementation.   6. Mitral Regurgitation/Pulmonary HTN - She had moderate MR by echo in 01/2022 and echo at that time also showed low-normal RV function and PASP of 67 mmHg. Limited echo this admission shows EF has normalized. Continue to follow as an outpatient. She previously had a sleep study in 01/2021 only showing mild OSA.    From a cardiac perspective, should be good for discharge later today. She feels weak and deconditioned,  therefore will ask PT to see prior to discharge. Will arrange for outpatient Cardiology follow-up.    For questions or updates, please contact [place] Please consult [email or web address] for contact info under        Signed, Erma Heritage, PA-C  06/21/2022, 8:19  AM

## 2022-06-21 NOTE — Progress Notes (Signed)
CSW noted PT recommended Outpatient PT at D/C. CSW spoke with pt who is agreeable to referral. CSW made referral to [facility] outpatient PT. CSW added to AVS. TOC to follow.

## 2022-06-21 NOTE — Progress Notes (Signed)
Pt alert and able to make needs known. Attending at bedside. Will continue to monitor. Spouse at bedside

## 2022-06-21 NOTE — Discharge Summary (Signed)
Physician Discharge Summary   Patient: Maria Fry MRN: [id number] DOB: 30-Aug-1951  Admit date:     06/18/2022  Discharge date: 06/21/22  Discharge Physician: Barton Dubois   PCP: Renee Rival, FNP   Recommendations at discharge:  Repeat basic metabolic panel to follow electrolytes renal function Repeat CBC to follow hemoglobin trend Reassess blood pressure and adjust antihypertensive treatment as needed. Make sure patient has follow-up with cardiology service as instructed.  Discharge Diagnoses: Active Problems:   Essential hypertension   CAD (coronary artery disease)   Chronic combined systolic and diastolic heart failure (HCC)   Mixed hyperlipidemia   Microcytic anemia   Atrial fibrillation, chronic (HCC)   Acquired hypothyroidism   Constipation   Chest pain   Elevated brain natriuretic peptide (BNP) level   Insomnia   Refractory angina [facility])  Brief hospital admission course: As per H&P written by Dr. Josephine Cables on 06/18/2022 Maria Fry is a 71 y.o. female with medical history significant of hypertension, hyperlipidemia, hypothyroidism, CAD, chronic combined systolic and diastolic CHF, atrial fibrillation on Eliquis and stable angina on Ranexa who presents to the emergency department due to several days of onset of chest pain.  Chest pain was described as sharp and was below the breasts on both sides of the chest as well as left shoulder.  Chest pain occurs both at rest and on walking and the pain worsens on ambulation due to shortness of breath which has resulted in decreased exercise tolerance.  Chest pain was nonreproducible, though she referred to residual soreness due to the chest pain on palpation.  Pain was similar to her anginal pain.  She has been unable to refill her Ranexa since last week because the pharmacy ran out of stock, so she has been having the chest pain, she was started on Imdur yesterday by cardiologist, but pain continued and worsened today.  She states  that she has been taking 1 nitroglycerin daily, but her cardiologist told that she cannot be taking nitroglycerin on daily basis and that she needs to go to the ED if chest pain persist.  She denies fever, chills, nausea, vomiting, abdominal pain, headache.  She denies use of alcohol, tobacco or any other recreational drug use.   ED Course:  In the emergency department, temperature was 98.81F, respiratory rate 23/min, pulse 108 bpm, BP 125/93, O2 sat 94%.  Work-up in the ED showed microcytic anemia, BNP 305 (this was 766 on 01/15/2022), troponin x2 was negative. Chest x-ray showed chronic cardiomegaly and aortic tortuosity.  No active disease Cardiologist at [facility] was consulted and recommended that patient can stay here at [facility] and that cardiology will round on him in the morning.  Hospitalist was asked to admit patient for further evaluation and management.  Assessment and Plan: * Atrial fibrillation, chronic (HCC) -Continue the use of Eliquis for secondary prevention and -Patient is s/p cardioversion on 06/20/2022; so far successful and has remained in sinus rhythm. -Continue outpatient follow-up with cardiology service.   Insomnia -Continue the use of melatonin.  Elevated brain natriuretic peptide (BNP) level -Continue heart healthy/low-sodium diet, continue daily weights and adequate hydration. -Overall heart failure appears to be compensated.  Chest pain -Currently chest pain-free -Symptoms most likely associated with arrhythmia at time of admission. -Continue to follow up with cardiology service as an outpatient. -Continue the use of a statins and is currently on Eliquis. -Continue beta-blocker.  Constipation -Continue as needed laxative. -Patient denies abdominal pain.  Acquired hypothyroidism -Continue Synthroid. -TSH  within normal limits.  Microcytic anemia -MCV 76.9 -No signs of acute bleeding -Continue to follow hemoglobin trend. -No overt bleeding  appreciated.  Mixed hyperlipidemia -Continue Lipitor -Heart healthy diet discussed with patient.  Chronic combined systolic and diastolic heart failure (HCC) -Overall compensated -Continue to follow low-sodium diet, adequate hydration and follow daily weights.   -Patient denies increased in her abdominal girth or lower extremity swelling. -Continue to follow cardiology service recommendations. -Patient's daily diuretics dose has been adjusted to 40 mg daily at this point. -Ejection fraction repeat echo has demonstrated improvement of her EF to 55%. -Continue patient follow-up with cardiology service.  CAD (coronary artery disease) -Troponin negative x2 -No acute ischemic changes appreciated on EKG or telemetry; but positive atrial fibrillation with rate controlled. -Case discussed with cardiology service who feel patient's symptoms are associated with arrhythmia -Continue medication management, and outpatient follow-up with cardiology service. -2D echo demonstrating no wall motion abnormalities. -Patient is not on aspirin as she is chronically on Eliquis.  Essential hypertension -stable vital signs -Patient reports no lightheadedness or dizziness sensation -Continue at discharge adjusted dose of Aldactone, amlodipine, metoprolol and Lasix. -Continue patient follow-up with cardiology service. -Imdur has been discontinued. -Heart healthy diet discussed with patient.  Class 2 obesity -Body mass index is 37.92 kg/m. -Low calorie diet, portion control and increasing activity discussed with patient.  History of refractory angina -Prior allergic reaction to the use of Ranexa -Pain-free appreciated at this point -Continue patient follow-up with cardiology service -Continue current cardiac medications management.  Consultants: Cardiology service. Procedures performed: See below for x-ray report; limited echo and cardioversion performed throughout this hospitalization. Disposition:  Home with outpatient physical therapy. Diet recommendation: Heart healthy/low calorie and low-sodium diet.  DISCHARGE MEDICATION: Allergies as of 06/21/2022       Reactions   Afrin [oxymetazoline] Anaphylaxis   Amoxicillin Anaphylaxis   Lisinopril Anaphylaxis   Ranolazine Anaphylaxis   Throat swells, Dizzy, Reaction occurred with generic med   Vicodin [hydrocodone-acetaminophen] Diarrhea, Nausea And Vomiting   Demerol [meperidine] Nausea And Vomiting   Flonase [fluticasone]    anaphylaxis   Lodine [etodolac] Rash        Medication List     STOP taking these medications    isosorbide mononitrate 30 MG 24 hr tablet Commonly known as: IMDUR   magnesium oxide 400 (240 Mg) MG tablet Commonly known as: MAGnesium-Oxide   promethazine-dextromethorphan 6.25-15 MG/5ML syrup Commonly known as: PROMETHAZINE-DM       TAKE these medications    acetaminophen 500 MG tablet Commonly known as: TYLENOL Take 500 mg by mouth every 6 (six) hours as needed for mild pain or moderate pain.   amLODipine 2.5 MG tablet Commonly known as: NORVASC Take 1 tablet (2.5 mg total) by mouth daily. Start taking on: June 22, 2022   apixaban 5 MG Tabs tablet Commonly known as: Eliquis Take 1 tablet (5 mg total) by mouth 2 (two) times daily.   atorvastatin 80 MG tablet Commonly known as: LIPITOR Take 1 tablet (80 mg total) by mouth at bedtime.   calcium carbonate 1250 (500 Ca) MG tablet Commonly known as: OS-CAL - dosed in mg of elemental calcium Take 500 mg by mouth daily.   diclofenac Sodium 1 % Gel Commonly known as: VOLTAREN Apply 1 application topically 4 (four) times daily as needed (pain).   dofetilide 125 MCG capsule Commonly known as: TIKOSYN Take 1 capsule (125 mcg total) by mouth 2 (two) times daily.   ferrous sulfate 325 (65 FE)  MG EC tablet Take 325 mg by mouth daily.   furosemide 40 MG tablet Commonly known as: Lasix Take 1 tablet (40 mg total) by mouth daily. What  changed: when to take this   gabapentin 300 MG capsule Commonly known as: NEURONTIN Take 1 capsule (300 mg total) by mouth 2 (two) times daily.   levothyroxine 100 MCG tablet Commonly known as: SYNTHROID Take 1 tablet (100 mcg total) by mouth daily before breakfast.   loratadine 10 MG tablet Commonly known as: CLARITIN Take 10 mg by mouth daily.   magnesium oxide 400 MG tablet Commonly known as: MAG-OX Take 0.5 tablets (200 mg total) by mouth daily.   melatonin 3 MG Tabs tablet Take 3 mg by mouth at bedtime.   metoprolol tartrate 25 MG tablet Commonly known as: LOPRESSOR Take 1 tablet (25 mg total) by mouth 2 (two) times daily.   nitroGLYCERIN 0.4 MG SL tablet Commonly known as: NITROSTAT DISSOLVE ONE TABLET UNDER THE TONGUE EVERY 5 MINUTES AS NEEDED FOR CHEST PAIN.  DO NOT EXCEED A TOTAL OF 3 DOSES IN 15 MINUTES   ondansetron 4 MG disintegrating tablet Commonly known as: ZOFRAN-ODT Take 1 tablet (4 mg total) by mouth every 8 (eight) hours as needed for nausea or vomiting.   potassium chloride SA 20 MEQ tablet Commonly known as: KLOR-CON M Take 2 tablets (40 mEq total) by mouth daily.   spironolactone 25 MG tablet Commonly known as: ALDACTONE Take 1/2 (one-half) tablet by mouth once daily What changed: See the new instructions.   vitamin C 500 MG tablet Commonly known as: ASCORBIC ACID Take 500 mg by mouth daily.   Vitamin D3 50 MCG (2000 UT) capsule Take 1 capsule (2,000 Units total) by mouth daily.        Follow-up Information     Imogene Burn, PA-C Follow up on 07/08/2022.   Specialty: Cardiology Why: Cardiology Hospital Follow-up on 07/08/2022 at 2:00 PM. Contact information: [place] [address] [phone]         [facility] Follow up.   Specialty: Rehabilitation Contact information: [place] [phone] [place] [address] [phone]        Renee Rival, FNP. Schedule an appointment as soon as possible for a visit in 2 week(s).   Specialty: Nurse Practitioner Contact information: [address] [place] [address] [place] [address] [phone]         Thompson Grayer, MD .   Specialty: Cardiology Contact information: [place] [address] [phone]         Satira Sark, MD .   Specialty: Cardiology Contact information: [place] [address] [phone]                Discharge Exam: Danley Danker Weights   06/19/22 0313 06/20/22 0600  Weight: 97.1 kg 97.1 kg   General exam: Alert, awake, oriented x 3; patient reports no chest pain or palpitations.  Expressed no shortness of breath and otherwise feeling good. Respiratory system: Clear to auscultation. Respiratory effort normal.  No using accessory muscles. Cardiovascular system: Rate controlled, sinus rhythm; no rubs, no gallops, no JVD on exam. Gastrointestinal system: Abdomen is obese, nondistended, soft and nontender. No organomegaly or masses felt. Normal bowel sounds heard. Central nervous system: Alert and oriented. No focal neurological deficits. Extremities: No cyanosis or clubbing. Skin: No petechiae. Psychiatry: Judgement and insight appear normal. Mood &  affect appropriate.   Condition at discharge: Stable and improved.  The results of significant diagnostics from this hospitalization (including imaging, microbiology, ancillary and laboratory) are listed below for reference.   Imaging Studies: ECHOCARDIOGRAM LIMITED  Result Date: 06/19/2022    ECHOCARDIOGRAM LIMITED REPORT   Patient Name:   Maria Fry Date of Exam: 06/19/2022 Medical Rec #:  [id number]    Height:       63.0 in Accession #:    [id number]   Weight:       214.0 lb Date of Birth:  [birth year]     BSA:          1.990 m Patient Age:    41 years     BP:           131/65 mmHg Patient Gender: F            HR:           71 bpm. Exam  Location:  [facility] Procedure: Limited Echo Indications:    Chest Pain  History:        Patient has prior history of Echocardiogram examinations, most                 recent 01/30/2022. CHF, Previous Myocardial Infarction and CAD,                 Arrythmias:Atrial Fibrillation, Signs/Symptoms:Chest Pain and                 Shortness of Breath; Risk Factors:Hypertension and Dyslipidemia.  Sonographer:    Wenda Low Referring Phys: [id number] [place]  1. Left ventricular ejection fraction, by estimation, is 55%. The left ventricle has normal function. Left ventricular endocardial border not optimally defined to evaluate regional wall motion. There is severe left ventricular hypertrophy.  2. Right ventricular systolic function is normal. The right ventricular size is normal.  3. The aortic valve is tricuspid. There is mild calcification of the aortic valve. There is mild thickening of the aortic valve.  4. IVC is small suggesting low RA pressure and hypovolemia.  5. Limited echo to evaluate LV function FINDINGS  Left Ventricle: Left ventricular ejection fraction, by estimation, is 55%. The left ventricle has normal function. Left ventricular endocardial border not optimally defined to evaluate regional wall motion. The left ventricular internal cavity size was normal in size. There is severe left ventricular hypertrophy. Right Ventricle: The right ventricular size is normal. Right vetricular wall thickness was not well visualized. Right ventricular systolic function is normal. Pericardium: There is no evidence of pericardial effusion. Aortic Valve: The aortic valve is tricuspid. There is mild calcification of the aortic valve. There is mild thickening of the aortic valve. There is mild aortic valve annular calcification. Aorta: The aortic root is normal in size and structure. Venous: IVC is small suggesting low RA pressure and hypovolemia. LEFT VENTRICLE PLAX 2D LVIDd:         6.20 cm LVIDs:          3.80 cm LV PW:         1.50 cm LV IVS:        1.50 cm LVOT diam:     2.00 cm LVOT Area:     3.14 cm  LEFT ATRIUM         Index LA diam:    4.50 cm 2.26 cm/m   AORTA Ao Root diam: 3.60 cm  SHUNTS Systemic Diam: 2.00 cm Carlyle Dolly MD Electronically  signed by Carlyle Dolly MD Signature Date/Time: 06/19/2022/2:02:01 PM    Final    DG Chest 2 View  Result Date: 06/18/2022 CLINICAL DATA:  Palpitations.  Shortness of breath. EXAM: CHEST - 2 VIEW COMPARISON:  01/15/2022 FINDINGS: Chronic cardiomegaly. Chronic tortuous aorta. The lungs are clear. No edema, infiltrate, collapse or effusion. No acute bone finding. IMPRESSION: Chronic cardiomegaly and aortic tortuosity.  No active disease. Electronically Signed   By: Nelson Chimes M.D.   On: 06/18/2022 17:37    Microbiology: Results for orders placed or performed during the hospital encounter of 01/15/22  Blood culture (routine x 2)     Status: None   Collection Time: 01/15/22  4:09 PM   Specimen: BLOOD  Result Value Ref Range Status   Specimen Description BLOOD LEFT ANTECUBITAL  Final   Special Requests   Final    BOTTLES DRAWN AEROBIC AND ANAEROBIC Blood Culture adequate volume   Culture   Final    NO GROWTH 5 DAYS Performed at [facility], [address], [place], [place] [address]    Report Status 01/20/2022 FINAL  Final  Blood culture (routine x 2)     Status: None   Collection Time: 01/15/22  4:09 PM   Specimen: BLOOD  Result Value Ref Range Status   Specimen Description BLOOD RIGHT ANTECUBITAL  Final   Special Requests   Final    BOTTLES DRAWN AEROBIC AND ANAEROBIC Blood Culture adequate volume   Culture   Final    NO GROWTH 5 DAYS Performed at [facility], [address], [place], [place] [address]    Report Status 01/20/2022 FINAL  Final  Resp Panel by RT-PCR (Flu A&B, Covid) Nasopharyngeal Swab     Status: Abnormal   Collection Time: 01/15/22  4:48 PM   Specimen: Nasopharyngeal Swab; Nasopharyngeal(NP) swabs in vial  transport medium  Result Value Ref Range Status   SARS Coronavirus 2 by RT PCR POSITIVE (A) NEGATIVE Final    Comment: (NOTE) SARS-CoV-2 target nucleic acids are DETECTED.  The SARS-CoV-2 RNA is generally detectable in upper respiratory specimens during the acute phase of infection. Positive results are indicative of the presence of the identified virus, but do not rule out bacterial infection or co-infection with other pathogens not detected by the test. Clinical correlation with patient history and other diagnostic information is necessary to determine patient infection status. The expected result is Negative.  Fact Sheet for Patients: EntrepreneurPulse.com.au  Fact Sheet for Healthcare Providers: IncredibleEmployment.be  This test is not yet approved or cleared by the [place] FDA and  has been authorized for detection and/or diagnosis of SARS-CoV-2 by FDA under an Emergency Use Authorization (EUA).  This EUA will remain in effect (meaning this test can be used) for the duration of  the COVID-19 declaration under Section 564(b)(1) of the A ct, 21 U.S.C. section 360bbb-3(b)(1), unless the authorization is terminated or revoked sooner.     Influenza A by PCR NEGATIVE NEGATIVE Final   Influenza B by PCR NEGATIVE NEGATIVE Final    Comment: (NOTE) The Xpert Xpress SARS-CoV-2/FLU/RSV plus assay is intended as an aid in the diagnosis of influenza from Nasopharyngeal swab specimens and should not be used as a sole basis for treatment. Nasal washings and aspirates are unacceptable for Xpert Xpress SARS-CoV-2/FLU/RSV testing.  Fact Sheet for Patients: EntrepreneurPulse.com.au  Fact Sheet for Healthcare Providers: IncredibleEmployment.be  This test is not yet approved or cleared by the [place] FDA and has been authorized for detection and/or  diagnosis of SARS-CoV-2 by FDA under an Emergency Use  Authorization (EUA). This EUA will remain in effect (meaning this test can be used) for the duration of the COVID-19 declaration under Section 564(b)(1) of the Act, 21 U.S.C. section 360bbb-3(b)(1), unless the authorization is terminated or revoked.  Performed at [facility], [address], [place], [place] [address]     Labs: CBC: Recent Labs  Lab 06/18/22 1806 06/19/22 0524  WBC 9.3 9.7  HGB 9.2* 8.5*  HCT 30.0* 28.2*  MCV 76.9* 77.3*  PLT 443* 015   Basic Metabolic Panel: Recent Labs  Lab 06/18/22 1806 06/19/22 0524  NA 137 137  K 4.7 4.2  CL 104 104  CO2 26 26  GLUCOSE 117* 96  BUN 18 20  CREATININE 1.26* 1.05*  CALCIUM 8.5* 8.6*  MG  --  2.4  PHOS  --  3.1   Liver Function Tests: Recent Labs  Lab 06/19/22 0524  AST 16  ALT 11  ALKPHOS 72  BILITOT 0.5  PROT 7.9  ALBUMIN 3.4*   CBG: No results for input(s): "GLUCAP" in the last 168 hours.  Discharge time spent: greater than 30 minutes.  Signed: Barton Dubois, MD [facility] 06/21/2022

## 2022-06-24 ENCOUNTER — Telehealth: Payer: Self-pay | Admitting: *Deleted

## 2022-06-24 NOTE — Telephone Encounter (Signed)
Transition Care Management Follow-up Telephone Call Date of discharge and from where: 06-21-22 [facility] How have you been since you were released from the hospital? Pretty good Any questions or concerns? No  Items Reviewed: Did the pt receive and understand the discharge instructions provided? Yes  Medications obtained and verified? Yes  Other? No  Any new allergies since your discharge? No  Dietary orders reviewed? Yes Do you have support at home? Yes   Home Care and Equipment/Supplies: Were home health services ordered? not applicable If so, what is the name of the agency? NA  Has the agency set up a time to come to the patient's home? not applicable Were any new equipment or medical supplies ordered?  No What is the name of the medical supply agency? NA Were you able to get the supplies/equipment? not applicable Do you have any questions related to the use of the equipment or supplies? No  Functional Questionnaire: (I = Independent and D = Dependent) ADLs: i  Bathing/Dressing- i  Meal Prep- i  Eating- i  Maintaining continence- i  Transferring/Ambulation- i  Managing Meds- i  Follow up appointments reviewed:  PCP Hospital f/u appt confirmed? Yes  Scheduled to see GLoria on 07-01-22 @ 9:00. [facility] f/u appt confirmed? No   Are transportation arrangements needed? No If their condition worsens, is the pt aware to call PCP or go to the Emergency Dept.? Yes Was the patient provided with contact information for the PCP's office or ED? Yes Was to pt encouraged to call back with questions or concerns? Yes

## 2022-06-25 ENCOUNTER — Encounter (HOSPITAL_COMMUNITY): Payer: Self-pay | Admitting: Cardiology

## 2022-07-01 ENCOUNTER — Encounter: Payer: Self-pay | Admitting: Family Medicine

## 2022-07-01 ENCOUNTER — Ambulatory Visit (INDEPENDENT_AMBULATORY_CARE_PROVIDER_SITE_OTHER): Payer: Medicare Other | Admitting: Family Medicine

## 2022-07-01 VITALS — BP 124/67 | HR 56 | Ht 62.0 in | Wt 216.0 lb

## 2022-07-01 DIAGNOSIS — I202 Refractory angina pectoris: Secondary | ICD-10-CM | POA: Diagnosis not present

## 2022-07-01 DIAGNOSIS — Z23 Encounter for immunization: Secondary | ICD-10-CM

## 2022-07-01 NOTE — Progress Notes (Signed)
Established Patient Office Visit  Subjective:  Patient ID: Maria Fry, female    DOB: December 01, 1951  Age: 71 y.o. MRN: [id number]  CC:  Chief Complaint  Patient presents with   Follow-up    TOC follow up, admitted on 07/18, d/c on 07/21. Pt states she has been feeling well, will be following up with cardiology on 08/07    HPI Maria Fry is a 71 y.o. female with past medical history of hypertension, hyperlipidemia, hypothyroidism, CAD, chronic combined systolic and diastolic CHF, atrial fibrillation on Eliquistable angina, and stable angina on Ranexa presents for hospital follow-up.   The patient was seen in the hospital from 7/19-7/21 for refractory angina likely associated with arrhythmia.EKG showed A-fib with rate control with PVCs. She reports being out of her Renexa due to the medication being out of stock and taking nitroglycerin daily for her chest pain. She notes that her cardiologist informed her to report to the ED because she cannot take nitroglycerin daily. Chest x-ray showed chronic cardiomegaly and aortic tortuosity with no active disease.  Following discharge from the hospital, the patient reports no chest pain. She reports feeling well with a follow-up appt with her cardiologist on 07/08/2022. She shares that Renexa is out of stock and that she is allergic to the alternative medicine ranolazine. She notes to be starting outpatient PT soon.    Past Medical History:  Diagnosis Date   Arthritis    Right knee   Atrial fibrillation (HCC)    CAD (coronary artery disease)    a. 06/07/15 NSTEMI/Cath: Dist LAD 100%, mid LAD 10%. Otw nl cors. Nl EF w/ inferoapical AK.   Essential hypertension    Hip pain    History of cardiomyopathy 04/23/2016   a. 04/2016 Echo: EF 45%, Gr2 DD; b. 11/2019 Echo: EF 45-50%, mod LVH. Gr2 DD. Nl RV fxn. Sev dil LA.    Hyperlipidemia    Hypothyroidism    NSTEMI (non-ST elevated myocardial infarction) ([facility]) 06/05/2015   Vitamin D deficiency     Past  Surgical History:  Procedure Laterality Date   ABDOMINAL HYSTERECTOMY     partial- one ovary remaining   BIOPSY  09/17/2020   Procedure: BIOPSY;  Surgeon: [name], DO;  [facility];  Service: Endoscopy;;   CARDIAC CATHETERIZATION N/A 06/06/2015   Procedure: Left Heart Cath and Coronary Angiography;  Surgeon: Peter M Martinique, MD;  Location: [facility];  Service: Cardiovascular;  Laterality: N/A;   CARDIOVERSION N/A 09/14/2020   Procedure: CARDIOVERSION;  Surgeon: Satira Sark, MD;  Location: [facility];  Service: Cardiovascular;  Laterality: N/A;   CARDIOVERSION N/A 06/20/2022   Procedure: CARDIOVERSION;  Surgeon: Arnoldo Lenis, MD;  Location: [facility];  Service: Endoscopy;  Laterality: N/A;   CHOLECYSTECTOMY     COLONOSCOPY     COLONOSCOPY N/A 08/13/2017   Procedure: COLONOSCOPY;  Surgeon: Daneil Dolin, MD;  [facility]: [facility];  Service: Endoscopy;  Laterality: N/A;  8:30 AM   COLONOSCOPY WITH PROPOFOL N/A 01/18/2021   [name]: Multiple tubular adenomas removed, next colonoscopy in 3 years   ESOPHAGOGASTRODUODENOSCOPY N/A 09/11/2016   Procedure: ESOPHAGOGASTRODUODENOSCOPY (EGD);  Surgeon: Daneil Dolin, MD;  [facility]: [facility];  Service: Endoscopy;  Laterality: N/A;  7:45 am - moved to 10/11 @ 10:30   ESOPHAGOGASTRODUODENOSCOPY (EGD) WITH PROPOFOL N/A 09/17/2020   [name]: Diffuse moderate inflammation characterized by erosions and erythema found in the entire stomach.  Biopsies positive for H.  pylori.  Patient has not been treated due to drug allergies.   Heel spurs Left    MALONEY DILATION N/A 09/11/2016   Procedure: MALONEY DILATION;  Surgeon: Robert M [name], MD;  [facility];  Service: Endoscopy;  Laterality: N/A;   POLYPECTOMY  08/13/2017   Procedure: POLYPECTOMY;  Surgeon: [name], Robert M, MD;  [facility];  Service: Endoscopy;;  colon   POLYPECTOMY  01/18/2021   Procedure: POLYPECTOMY;  Surgeon: [name], Robert M,  MD;  [facility];  Service: Endoscopy;;   SHOULDER ARTHROSCOPY Right    TUBAL LIGATION      Family History  Problem Relation Age of Onset   Heart failure Father        Deceased   Heart attack Father        Deceased   Stroke Sister        Deceased   Heart failure Brother        Deceased   Cancer Brother        Deceased   Heart attack Brother        Deceased   COPD Daughter    Arthritis Daughter    Lupus Daughter    Colon cancer Neg Hx     Social History   Socioeconomic History   Marital status: Married    Spouse name: Not on file   Number of children: 3   Years of education: 10   Highest education level: Not on file  Occupational History   Occupation: team leader at [employer]  Tobacco Use   Smoking status: Former    Packs/day: 1.00    Years: 20.00    Total pack years: 20.00    Types: Cigarettes    Start date: 12/02/1977    Quit date: 12/02/2002    Years since quitting: 19.5   Smokeless tobacco: Never   Tobacco comments:    Former smoker 05/10/22  Vaping Use   Vaping Use: Never used  Substance and Sexual Activity   Alcohol use: No    Alcohol/week: 0.0 standard drinks of alcohol   Drug use: No   Sexual activity: Not Currently  Other Topics Concern   Not on file  Social History Narrative   Retired from [employer].   Social Determinants of Health   Financial Resource Strain: Low Risk  (11/28/2021)   Overall Financial Resource Strain (CARDIA)    Difficulty of Paying Living Expenses: Not hard at all  Food Insecurity: No Food Insecurity (11/28/2021)   Hunger Vital Sign    Worried About Running Out of Food in the Last Year: Never true    Ran Out of Food in the Last Year: Never true  Transportation Needs: No Transportation Needs (11/28/2021)   PRAPARE - Transportation    Lack of Transportation (Medical): No    Lack of Transportation (Non-Medical): No  Physical Activity: Insufficiently Active (11/28/2021)   Exercise Vital Sign    Days of  Exercise per Week: 5 days    Minutes of Exercise per Session: 10 min  Stress: No Stress Concern Present (11/28/2021)   [employer] - Occupational Stress Questionnaire    Feeling of Stress : Not at all  Social Connections: Moderately Integrated (11/28/2021)   Social Connection and Isolation Panel [NHANES]    Frequency of Communication with Friends and Family: More than three times a week    Frequency of Social Gatherings with Friends and Family: More than three times a week      Attends Religious Services: More than 4 times per year    Active Member of Clubs or Organizations: No    Attends Club or Organization Meetings: Never    Marital Status: Married  Intimate Partner Violence: Not At Risk (11/28/2021)   Humiliation, Afraid, Rape, and Kick questionnaire    Fear of Current or Ex-Partner: No    Emotionally Abused: No    Physically Abused: No    Sexually Abused: No    Outpatient Medications Prior to Visit  Medication Sig Dispense Refill   acetaminophen (TYLENOL) 500 MG tablet Take 500 mg by mouth every 6 (six) hours as needed for mild pain or moderate pain.     amLODipine (NORVASC) 2.5 MG tablet Take 1 tablet (2.5 mg total) by mouth daily. 30 tablet 1   apixaban (ELIQUIS) 5 MG TABS tablet Take 1 tablet (5 mg total) by mouth 2 (two) times daily. 60 tablet 11   atorvastatin (LIPITOR) 80 MG tablet Take 1 tablet (80 mg total) by mouth at bedtime. 90 tablet 3   calcium carbonate (OS-CAL - DOSED IN MG OF ELEMENTAL CALCIUM) 1250 (500 Ca) MG tablet Take 500 mg by mouth daily.     Cholecalciferol (VITAMIN D3) 50 MCG (2000 UT) capsule Take 1 capsule (2,000 Units total) by mouth daily. 30 capsule 3   diclofenac Sodium (VOLTAREN) 1 % GEL Apply 1 application topically 4 (four) times daily as needed (pain).     dofetilide (TIKOSYN) 125 MCG capsule Take 1 capsule (125 mcg total) by mouth 2 (two) times daily. 180 capsule 1   ferrous sulfate 325 (65 FE) MG EC tablet Take 325 mg  by mouth daily.     furosemide (LASIX) 40 MG tablet Take 1 tablet (40 mg total) by mouth daily.     gabapentin (NEURONTIN) 300 MG capsule Take 1 capsule (300 mg total) by mouth 2 (two) times daily. 60 capsule 2   levothyroxine (SYNTHROID) 100 MCG tablet Take 1 tablet (100 mcg total) by mouth daily before breakfast. 90 tablet 1   loratadine (CLARITIN) 10 MG tablet Take 10 mg by mouth daily.     magnesium oxide (MAG-OX) 400 MG tablet Take 0.5 tablets (200 mg total) by mouth daily. 30 tablet 6   melatonin 3 MG TABS tablet Take 3 mg by mouth at bedtime.     metoprolol tartrate (LOPRESSOR) 25 MG tablet Take 1 tablet (25 mg total) by mouth 2 (two) times daily. 60 tablet 6   nitroGLYCERIN (NITROSTAT) 0.4 MG SL tablet DISSOLVE ONE TABLET UNDER THE TONGUE EVERY 5 MINUTES AS NEEDED FOR CHEST PAIN.  DO NOT EXCEED A TOTAL OF 3 DOSES IN 15 MINUTES (Patient taking differently: DISSOLVE ONE TABLET UNDER THE TONGUE EVERY 5 MINUTES AS NEEDED FOR CHEST PAIN.  DO NOT EXCEED A TOTAL OF 3 DOSES IN 15 MINUTES) 25 tablet 3   ondansetron (ZOFRAN-ODT) 4 MG disintegrating tablet Take 1 tablet (4 mg total) by mouth every 8 (eight) hours as needed for nausea or vomiting. 20 tablet 0   potassium chloride SA (KLOR-CON M) 20 MEQ tablet Take 2 tablets (40 mEq total) by mouth daily.     vitamin C (ASCORBIC ACID) 500 MG tablet Take 500 mg by mouth daily.     spironolactone (ALDACTONE) 25 MG tablet Take 1/2 (one-half) tablet by mouth once daily (Patient not taking: Reported on 07/01/2022) 45 tablet 0   No facility-administered medications prior to visit.    Allergies  Allergen Reactions   Afrin [Oxymetazoline] Anaphylaxis     Amoxicillin Anaphylaxis   Lisinopril Anaphylaxis   Ranolazine Anaphylaxis    Throat swells, Dizzy, Reaction occurred with generic med   Vicodin [Hydrocodone-Acetaminophen] Diarrhea and Nausea And Vomiting   Demerol [Meperidine] Nausea And Vomiting   Flonase [Fluticasone]     anaphylaxis   Lodine  [Etodolac] Rash    ROS Review of Systems  Constitutional:  Negative for fever.  Respiratory:  Negative for cough, chest tightness, shortness of breath and wheezing.   Cardiovascular:  Negative for chest pain, palpitations and leg swelling.  Gastrointestinal:  Negative for diarrhea, nausea and vomiting.  Neurological:  Negative for dizziness, tremors, weakness and headaches.      Objective:    Physical Exam HENT:     Head: Normocephalic.  Cardiovascular:     Rate and Rhythm: Normal rate and regular rhythm.     Pulses: Normal pulses.  Pulmonary:     Effort: Pulmonary effort is normal.     Breath sounds: Normal breath sounds.  Abdominal:     Palpations: Abdomen is soft.  Musculoskeletal:     Cervical back: No rigidity.     Right lower leg: No edema.  Skin:    General: Skin is warm.  Neurological:     Mental Status: She is alert.     BP 124/67   Pulse (!) 56   Ht 5' 2" (1.575 m)   Wt 216 lb 0.6 oz (98 kg)   SpO2 95%   BMI 39.51 kg/m  Wt Readings from Last 3 Encounters:  07/01/22 216 lb 0.6 oz (98 kg)  06/20/22 214 lb 1.1 oz (97.1 kg)  05/10/22 222 lb (100.7 kg)    Lab Results  Component Value Date   TSH 3.676 06/18/2022   Lab Results  Component Value Date   WBC 9.7 06/19/2022   HGB 8.5 (L) 06/19/2022   HCT 28.2 (L) 06/19/2022   MCV 77.3 (L) 06/19/2022   PLT 382 06/19/2022   Lab Results  Component Value Date   NA 137 06/19/2022   K 4.2 06/19/2022   CO2 26 06/19/2022   GLUCOSE 96 06/19/2022   BUN 20 06/19/2022   CREATININE 1.05 (H) 06/19/2022   BILITOT 0.5 06/19/2022   ALKPHOS 72 06/19/2022   AST 16 06/19/2022   ALT 11 06/19/2022   PROT 7.9 06/19/2022   ALBUMIN 3.4 (L) 06/19/2022   CALCIUM 8.6 (L) 06/19/2022   ANIONGAP 7 06/19/2022   EGFR 57 (L) 03/06/2022   Lab Results  Component Value Date   CHOL 123 03/06/2022   Lab Results  Component Value Date   HDL 48 03/06/2022   Lab Results  Component Value Date   LDLCALC 63 03/06/2022   Lab  Results  Component Value Date   TRIG 51 03/06/2022   Lab Results  Component Value Date   CHOLHDL 2.6 03/06/2022   Lab Results  Component Value Date   HGBA1C 5.5 01/03/2020      Assessment & Plan:   Problem List Items Addressed This Visit       Cardiovascular and Mediastinum   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HCC) - Primary    No symptoms of chest pain since discharged from the hospital She reports feeling well Following up with [facility] on 07/08/2022 She reports starting outpatient PT soon Pending CBC and BMP Labs and imaging reviewed        Relevant Orders   CBC   Basic Metabolic Panel (BMET)   Other Visit Diagnoses     Immunization due             No orders of the defined types were placed in this encounter.   Follow-up: Return if symptoms worsen or fail to improve.    Gloria  Zarwolo, FNP 

## 2022-07-01 NOTE — Patient Instructions (Signed)
I appreciate the opportunity to provide care to you today!    Follow up: 09/05/2022 '@0940'$  with [facility]  Labs: please stop by the lab today to get your blood drawn (CBC, BMP)    Please continue to a heart-healthy diet and increase your physical activities. Try to exercise for 70mns at least three times a week.      It was a pleasure to see you and I look forward to continuing to work together on your health and well-being. Please do not hesitate to call the office if you need care or have questions about your care.   Have a wonderful day and week. With Gratitude, GAlvira MondayMSN, FNP-BC

## 2022-07-01 NOTE — Assessment & Plan Note (Addendum)
No symptoms of chest pain since discharged from the hospital She reports feeling well Following up with [facility] on 07/08/2022 She reports starting outpatient PT soon Pending CBC and BMP Labs and imaging reviewed

## 2022-07-02 ENCOUNTER — Other Ambulatory Visit: Payer: Self-pay | Admitting: Family Medicine

## 2022-07-02 LAB — CBC
Hematocrit: 27.7 % — ABNORMAL LOW (ref 34.0–46.6)
Hemoglobin: 8.2 g/dL — ABNORMAL LOW (ref 11.1–15.9)
MCH: 22.7 pg — ABNORMAL LOW (ref 26.6–33.0)
MCHC: 29.6 g/dL — ABNORMAL LOW (ref 31.5–35.7)
MCV: 77 fL — ABNORMAL LOW (ref 79–97)
Platelets: 428 10*3/uL (ref 150–450)
RBC: 3.61 x10E6/uL — ABNORMAL LOW (ref 3.77–5.28)
RDW: 19.5 % — ABNORMAL HIGH (ref 11.7–15.4)
WBC: 8.4 10*3/uL (ref 3.4–10.8)

## 2022-07-02 LAB — BASIC METABOLIC PANEL
BUN/Creatinine Ratio: 18 (ref 12–28)
BUN: 18 mg/dL (ref 8–27)
CO2: 24 mmol/L (ref 20–29)
Calcium: 8.9 mg/dL (ref 8.7–10.3)
Chloride: 103 mmol/L (ref 96–106)
Creatinine, Ser: 1.01 mg/dL — ABNORMAL HIGH (ref 0.57–1.00)
Glucose: 93 mg/dL (ref 70–99)
Potassium: 4.3 mmol/L (ref 3.5–5.2)
Sodium: 141 mmol/L (ref 134–144)
eGFR: 60 mL/min/{1.73_m2} (ref >59)

## 2022-07-02 MED ORDER — FERROUS SULFATE 325 (65 FE) MG PO TBEC
325.0000 mg | DELAYED_RELEASE_TABLET | Freq: Every day | ORAL | 1 refills | Status: AC
Start: 2022-07-02 — End: ?

## 2022-07-02 NOTE — Progress Notes (Signed)
Please inform the patient that her iron is low along with her hemoglobin. I've sent a  refill of her iron and will like her to start taking oral iron daily. Please advise her to take her iron medicine on an empty stomach with orange juice.

## 2022-07-02 NOTE — Progress Notes (Signed)
Cardiology Office Note    Date:  07/08/2022   ID:  Maria Fry, DOB 05/26/1951, MRN [id number]   PCP:  Renee Rival, [place]  Cardiologist:  Rozann Lesches, MD   Advanced Practice Provider:  No care team member to display Electrophysiologist:  Thompson Grayer, MD   [phone]   No chief complaint on file.   History of Present Illness:  Maria Fry is a 71 y.o. 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  Patient admitted with recurrent Afib and underwent DCCV 06/20/22. She had chest pain off ranexa and is allergic to generic, intolerant to Imdur so started on amlodipine.  Patient comes in for f/u. Still gets a little tired but overall feels good. No further palpitations. Some ankle swelling but has gotten extra salt.    Past Medical History:  Diagnosis Date   Arthritis    Right knee   Atrial fibrillation (HCC)    CAD (coronary artery disease)    a. 06/07/15 NSTEMI/Cath: Dist LAD 100%, mid LAD 10%. Otw nl cors. Nl EF w/ inferoapical AK.   Essential hypertension    Hip pain    History of cardiomyopathy 04/23/2016   a. 04/2016 Echo: EF 45%, Gr2 DD; b. 11/2019 Echo: EF 45-50%, mod LVH. Gr2 DD. Nl RV fxn. Sev dil LA.    Hyperlipidemia    Hypothyroidism    NSTEMI (non-ST elevated myocardial infarction) ([place]) 06/05/2015   Vitamin D deficiency     Past Surgical History:  Procedure Laterality Date   ABDOMINAL HYSTERECTOMY     partial- one ovary remaining   BIOPSY  09/17/2020   Procedure: BIOPSY;  Surgeon: [name], DO;  [facility];  Service: Endoscopy;;   CARDIAC CATHETERIZATION N/A 06/06/2015   Procedure: Left Heart Cath and Coronary Angiography;  Surgeon: Peter M Martinique,  MD;  Location: [facility];  Service: Cardiovascular;  Laterality: N/A;   CARDIOVERSION N/A 09/14/2020   Procedure: CARDIOVERSION;  Surgeon: Satira Sark, MD;  Location: [facility];  Service: Cardiovascular;  Laterality: N/A;   CARDIOVERSION N/A 06/20/2022   Procedure: CARDIOVERSION;  Surgeon: Arnoldo Lenis, MD;  Location: [facility];  Service: Endoscopy;  Laterality: N/A;   CHOLECYSTECTOMY     COLONOSCOPY     COLONOSCOPY N/A 08/13/2017   Procedure: COLONOSCOPY;  Surgeon: Daneil Dolin, MD;  [facility];  Service: Endoscopy;  Laterality: N/A;  8:30 AM   COLONOSCOPY WITH PROPOFOL N/A 01/18/2021   [name]: Multiple tubular adenomas removed, next colonoscopy in 3 years   ESOPHAGOGASTRODUODENOSCOPY N/A 09/11/2016   Procedure: ESOPHAGOGASTRODUODENOSCOPY (EGD);  Surgeon: Daneil Dolin, MD;  [facility];  Service: Endoscopy;  Laterality: N/A;  7:45 am - moved to 10/11 @ 10:30   ESOPHAGOGASTRODUODENOSCOPY (EGD) WITH PROPOFOL N/A 09/17/2020   [name]: Diffuse moderate inflammation characterized by erosions and erythema found in the entire stomach.  Biopsies positive for H. pylori.  Patient has not been treated due to drug allergies.   Heel spurs Left    MALONEY DILATION N/A 09/11/2016   Procedure: Venia Minks DILATION;  Surgeon: Daneil Dolin, MD;  [facility];  Service: Endoscopy;  Laterality: N/A;  POLYPECTOMY  08/13/2017   Procedure: POLYPECTOMY;  Surgeon: Daneil Dolin, MD;  [facility];  Service: Endoscopy;;  colon   POLYPECTOMY  01/18/2021   Procedure: POLYPECTOMY;  Surgeon: Daneil Dolin, MD;  [facility];  Service: Endoscopy;;   SHOULDER ARTHROSCOPY Right    TUBAL LIGATION      Current Medications: Current Meds  Medication Sig   acetaminophen (TYLENOL) 500 MG tablet Take 500 mg by mouth every 6 (six) hours as needed for mild pain or moderate pain.   amLODipine (NORVASC) 2.5 MG tablet Take 1 tablet (2.5 mg total) by  mouth daily.   apixaban (ELIQUIS) 5 MG TABS tablet Take 1 tablet (5 mg total) by mouth 2 (two) times daily.   atorvastatin (LIPITOR) 80 MG tablet Take 1 tablet (80 mg total) by mouth at bedtime.   calcium carbonate (OS-CAL - DOSED IN MG OF ELEMENTAL CALCIUM) 1250 (500 Ca) MG tablet Take 500 mg by mouth daily.   Cholecalciferol (VITAMIN D3) 50 MCG (2000 UT) capsule Take 1 capsule (2,000 Units total) by mouth daily.   diclofenac Sodium (VOLTAREN) 1 % GEL Apply 1 application topically 4 (four) times daily as needed (pain).   dofetilide (TIKOSYN) 125 MCG capsule Take 1 capsule (125 mcg total) by mouth 2 (two) times daily.   ferrous sulfate 325 (65 FE) MG EC tablet Take 1 tablet (325 mg total) by mouth daily.   furosemide (LASIX) 40 MG tablet Take 1 tablet (40 mg total) by mouth daily.   gabapentin (NEURONTIN) 300 MG capsule Take 1 capsule (300 mg total) by mouth 2 (two) times daily.   levothyroxine (SYNTHROID) 100 MCG tablet Take 1 tablet (100 mcg total) by mouth daily before breakfast.   loratadine (CLARITIN) 10 MG tablet Take 10 mg by mouth daily.   magnesium oxide (MAG-OX) 400 MG tablet Take 0.5 tablets (200 mg total) by mouth daily.   melatonin 3 MG TABS tablet Take 3 mg by mouth at bedtime.   metoprolol tartrate (LOPRESSOR) 25 MG tablet Take 0.5 tablets (12.5 mg total) by mouth 2 (two) times daily.   nitroGLYCERIN (NITROSTAT) 0.4 MG SL tablet DISSOLVE ONE TABLET UNDER THE TONGUE EVERY 5 MINUTES AS NEEDED FOR CHEST PAIN.  DO NOT EXCEED A TOTAL OF 3 DOSES IN 15 MINUTES (Patient taking differently: DISSOLVE ONE TABLET UNDER THE TONGUE EVERY 5 MINUTES AS NEEDED FOR CHEST PAIN.  DO NOT EXCEED A TOTAL OF 3 DOSES IN 15 MINUTES)   ondansetron (ZOFRAN-ODT) 4 MG disintegrating tablet Take 1 tablet (4 mg total) by mouth every 8 (eight) hours as needed for nausea or vomiting.   potassium chloride SA (KLOR-CON M) 20 MEQ tablet Take 2 tablets (40 mEq total) by mouth daily.   spironolactone (ALDACTONE) 25 MG  tablet Take 1/2 (one-half) tablet by mouth once daily   vitamin C (ASCORBIC ACID) 500 MG tablet Take 500 mg by mouth daily.   [DISCONTINUED] metoprolol tartrate (LOPRESSOR) 25 MG tablet Take 1 tablet (25 mg total) by mouth 2 (two) times daily.     Allergies:   Afrin [oxymetazoline], Amoxicillin, Lisinopril, Ranolazine, Vicodin [hydrocodone-acetaminophen], Demerol [meperidine], Flonase [fluticasone], and Lodine [etodolac]   Social History   Socioeconomic History   Marital status: Married    Spouse name: Not on file   Number of children: 3   Years of education: 10   Highest education level: Not on file  Occupational History   Occupation: Company secretary at [employer] Use  Smoking status: Former    Packs/day: 1.00    Years: 20.00    Total pack years: 20.00    Types: Cigarettes    Start date: 12/02/1977    Quit date: 12/02/2002    Years since quitting: 19.6   Smokeless tobacco: Never   Tobacco comments:    Former smoker 05/10/22  Vaping Use   Vaping Use: Never used  Substance and Sexual Activity   Alcohol use: No    Alcohol/week: 0.0 standard drinks of alcohol   Drug use: No   Sexual activity: Not Currently  Other Topics Concern   Not on file  Social History Narrative   Retired from [employer].   Social Determinants of Health   Financial Resource Strain: Low Risk  (11/28/2021)   Overall Financial Resource Strain (CARDIA)    Difficulty of Paying Living Expenses: Not hard at all  Food Insecurity: No Food Insecurity (11/28/2021)   Hunger Vital Sign    Worried About Running Out of Food in the Last Year: Never true    Ran Out of Food in the Last Year: Never true  Transportation Needs: No Transportation Needs (11/28/2021)   PRAPARE - Hydrologist (Medical): No    Lack of Transportation (Non-Medical): No  Physical Activity: Insufficiently Active (11/28/2021)   Exercise Vital Sign    Days of Exercise per Week: 5 days    Minutes of  Exercise per Session: 10 min  Stress: No Stress Concern Present (11/28/2021)   [place]    Feeling of Stress : Not at all  Social Connections: Moderately Integrated (11/28/2021)   Social Connection and Isolation Panel [NHANES]    Frequency of Communication with Friends and Family: More than three times a week    Frequency of Social Gatherings with Friends and Family: More than three times a week    Attends Religious Services: More than 4 times per year    Active Member of [employer] or Organizations: No    Attends Music therapist: Never    Marital Status: Married     Family History:  The patient's  family history includes Arthritis in her daughter; COPD in her daughter; Cancer in her brother; Heart attack in her brother and father; Heart failure in her brother and father; Lupus in her daughter; Stroke in her sister.   ROS:   Please see the history of present illness.    ROS All other systems reviewed and are negative.   PHYSICAL EXAM:   VS:  BP (!) 140/68   Pulse (!) 46   Ht 5' 2.5" (1.588 m)   Wt 220 lb (99.8 kg)   SpO2 97%   BMI 39.60 kg/m   Physical Exam  GEN: Obese, in no acute distress  Neck: no JVD, carotid bruits, or masses Cardiac:RRR; no murmurs, rubs, or gallops  Respiratory:  clear to auscultation bilaterally, normal work of breathing GI: soft, nontender, nondistended, + BS Ext: plus 1 edema, otherwise without cyanosis, clubbing, or Good distal pulses bilaterally MS: no deformity or atrophy  Skin: warm and dry, no rash Neuro:  Alert and Oriented x 3,  Psych: euthymic mood, full affect  Wt Readings from Last 3 Encounters:  07/08/22 220 lb (99.8 kg)  07/01/22 216 lb 0.6 oz (98 kg)  06/20/22 214 lb 1.1 oz (97.1 kg)      Studies/Labs Reviewed:   EKG:  EKG is  ordered today.  The ekg ordered  today demonstrates sinus bradycardia 46/m QTc 491 reviewed with Dr. Debara Pickett  Recent  Labs: 06/18/2022: B Natriuretic Peptide 305.0; TSH 3.676 06/19/2022: ALT 11; Magnesium 2.4 07/01/2022: BUN 18; Creatinine, Ser 1.01; Hemoglobin 8.2; Platelets 428; Potassium 4.3; Sodium 141   Lipid Panel    Component Value Date/Time   CHOL 123 03/06/2022 1044   TRIG 51 03/06/2022 1044   HDL 48 03/06/2022 1044   CHOLHDL 2.6 03/06/2022 1044   CHOLHDL 2.7 01/03/2020 0948   VLDL 12 06/23/2017 0902   LDLCALC 63 03/06/2022 1044   LDLCALC 63 01/03/2020 0948    Additional studies/ records that were reviewed today include:  Limited Echo: 06/19/2022 IMPRESSIONS     1. Left ventricular ejection fraction, by estimation, is 55%. The left  ventricle has normal function. Left ventricular endocardial border not  optimally defined to evaluate regional wall motion. There is severe left  ventricular hypertrophy.   2. Right ventricular systolic function is normal. The right ventricular  size is normal.   3. The aortic valve is tricuspid. There is mild calcification of the  aortic valve. There is mild thickening of the aortic valve.   4. IVC is small suggesting low RA pressure and hypovolemia.   5. Limited echo to evaluate LV function        Risk Assessment/Calculations:    CHA2DS2-VASc Score = 5   This indicates a 7.2% annual risk of stroke. The patient's score is based upon: CHF History: 1 HTN History: 1 Diabetes History: 0 Stroke History: 0 Vascular Disease History: 1 Age Score: 1 Gender Score: 1        ASSESSMENT:    1. Chronic combined systolic and diastolic heart failure ([place])   2. Coronary artery disease involving native coronary artery of native heart without angina pectoris   3. Paroxysmal atrial fibrillation (HCC)   4. Hyperlipidemia, unspecified hyperlipidemia type   5. Microcytic anemia   6. Mitral valve insufficiency, unspecified etiology      PLAN:  In order of problems listed above:  Paroxysmal Atrial Fibrillation - Admitted with her first known recurrent  episode of atrial fibrillation since 10/2020 and underwent successful DCCV 06/20/22 with conversion to NSR following a 200 J shock. Remains on Tikosyn 125 mcg BID and Lopressor '25mg'$  BID. Sinus brady 46/m with QTc 491. Reviewed with Dr. Debara Pickett. Will reduce lopressor 12.5 mg bid and have her return next week for EKG. - No reports of active bleeding. Does have known iron deficiency anemia. Continue Eliquis '5mg'$  BID for anticoagulation.    CAD - cath in 2016 100% distal LAD occlusion  was on Ranexa but she is no longer able to obtain the brand name of this and was allergic to the generic.Intolerant to Imdur. She has been started on Amlodipine 2.5 mg daily and is so far tolerating. Continue Lipitor and Lopressor. No ASA given the need for Eliquis.    HFimpEF - Her EF was previously at 45-50% in 01/2022, improved to 55%  06/2022. IVC was small this admission and Lasix has been reduced from '40mg'$  BID to '40mg'$  daily. Continue Lopressor and Spironolactone. Some ankle edema most likely due to extra sodium intake.   HLD - on  Atorvastatin '80mg'$  daily. LDL was at 63 when checked in 03/2022.    Anemia - Hgb at 8.5 on most recent check and labs have been consistent with iron-deficiency anemia given Ferritin at 4. She is on Iron supplementation.     Mitral Regurgitation/Pulmonary HTN - She had moderate MR by  echo in 01/2022 and echo at that time also showed low-normal RV function and PASP of 67 mmHg. Limited echo this admission shows EF has normalized. Continue to follow as an outpatient. She previously had a sleep study in 01/2021 only showing mild OSA.            Shared Decision Making/Informed Consent        Medication Adjustments/Labs and Tests Ordered: Current medicines are reviewed at length with the patient today.  Concerns regarding medicines are outlined above.  Medication changes, Labs and Tests ordered today are listed in the Patient Instructions below. Patient Instructions  Medication  Instructions:  DECREASE Metoprolol to 12.5 mg Twice a day   Labwork: None today  Testing/Procedures: None today  Follow-Up: 1 week for Nurse visit for EKG   4 months with Dr.McDowell  Any Other Special Instructions Will Be Listed Below (If Applicable).  If you need a refill on your cardiac medications before your next appointment, please call your pharmacy.    Signed, Ermalinda Barrios, PA-C  07/08/2022 2:31 PM    [facility] [address], [place], [place]  [address] Phone: [phone]; Fax: [phone]

## 2022-07-04 ENCOUNTER — Telehealth: Payer: Self-pay | Admitting: Family Medicine

## 2022-07-04 NOTE — Telephone Encounter (Signed)
Second attempt

## 2022-07-04 NOTE — Telephone Encounter (Signed)
Pt returned call for labs 

## 2022-07-08 ENCOUNTER — Ambulatory Visit: Payer: Medicare Other | Admitting: Physician Assistant

## 2022-07-08 ENCOUNTER — Encounter: Payer: Self-pay | Admitting: Physician Assistant

## 2022-07-08 VITALS — BP 140/68 | HR 46 | Ht 62.5 in | Wt 220.0 lb

## 2022-07-08 DIAGNOSIS — I2510 Atherosclerotic heart disease of native coronary artery without angina pectoris: Secondary | ICD-10-CM | POA: Diagnosis not present

## 2022-07-08 DIAGNOSIS — I5042 Chronic combined systolic (congestive) and diastolic (congestive) heart failure: Secondary | ICD-10-CM | POA: Diagnosis not present

## 2022-07-08 DIAGNOSIS — E785 Hyperlipidemia, unspecified: Secondary | ICD-10-CM | POA: Diagnosis not present

## 2022-07-08 DIAGNOSIS — D509 Iron deficiency anemia, unspecified: Secondary | ICD-10-CM

## 2022-07-08 DIAGNOSIS — I480 Paroxysmal atrial fibrillation: Secondary | ICD-10-CM

## 2022-07-08 DIAGNOSIS — I340 Nonrheumatic mitral (valve) insufficiency: Secondary | ICD-10-CM

## 2022-07-08 MED ORDER — METOPROLOL TARTRATE 25 MG PO TABS: 12.5000 mg | ORAL_TABLET | Freq: Two times a day (BID) | ORAL | 3 refills | Status: DC

## 2022-07-08 NOTE — Patient Instructions (Signed)
Medication Instructions:  DECREASE Metoprolol to 12.5 mg Twice a day   Labwork: None today  Testing/Procedures: None today  Follow-Up: 1 week for Nurse visit for EKG   4 months with Dr.McDowell  Any Other Special Instructions Will Be Listed Below (If Applicable).  If you need a refill on your cardiac medications before your next appointment, please call your pharmacy.

## 2022-07-08 NOTE — Telephone Encounter (Signed)
Multiple attempts, lab letter mailed out.

## 2022-07-11 ENCOUNTER — Encounter: Payer: Self-pay | Admitting: Pulmonary Disease

## 2022-07-11 ENCOUNTER — Ambulatory Visit: Payer: Medicare Other | Admitting: Pulmonary Disease

## 2022-07-11 VITALS — BP 130/82 | HR 58 | Temp 98.7°F | Ht 62.5 in | Wt 220.6 lb

## 2022-07-11 DIAGNOSIS — Z9989 Dependence on other enabling machines and devices: Secondary | ICD-10-CM

## 2022-07-11 DIAGNOSIS — G4733 Obstructive sleep apnea (adult) (pediatric): Secondary | ICD-10-CM | POA: Diagnosis not present

## 2022-07-11 NOTE — Progress Notes (Signed)
[facility]  Chief Complaint  Patient presents with   Follow-up    Patient has not used CPAP in months.     Constitutional:  BP 130/82 (BP Location: Right Wrist, Patient Position: Sitting)   Pulse (!) 58   Temp 98.7 F (37.1 C) (Temporal)   Ht 5' 2.5" (1.588 m)   Wt 220 lb 9.6 oz (100.1 kg)   SpO2 97% Comment: ra  BMI 39.71 kg/m   Past Medical History:  CAD, CHF, HTN, HLD, A Fib, Hypothyroidism, Vit D deficiency, COVID 19 PNA February 2023  Past Surgical History:  Her  has a past surgical history that includes Tubal ligation; Cholecystectomy; Shoulder arthroscopy (Right); Heel spurs (Left); Cardiac catheterization (N/A, 06/06/2015); Esophagogastroduodenoscopy (N/A, 09/11/2016); maloney dilation (N/A, 09/11/2016); Colonoscopy; Colonoscopy (N/A, 08/13/2017); polypectomy (08/13/2017); Cardioversion (N/A, 09/14/2020); Esophagogastroduodenoscopy (egd) with propofol (N/A, 09/17/2020); biopsy (09/17/2020); Colonoscopy with propofol (N/A, 01/18/2021); polypectomy (01/18/2021); Abdominal hysterectomy; and Cardioversion (N/A, 06/20/2022).  Brief Summary:  Maria Fry is a 71 y.o. female former smoker with obstructive sleep apnea and sleep maintenance insomnia      Subjective:   She had COVID pneumonia in February.  She was in hospital in July for A fib and CHF.  She had trouble using CPAP.  She did well with initial set up.  Later on her mask starting leaking and rumbling.  This would wake her up.  She hasn't received new supplies since original set up.  She felt okay using CPAP otherwise.  Not having cough, wheeze, sputum, or dizziness.  Physical Exam:   Appearance - well kempt   ENMT - no sinus tenderness, no oral exudate, no LAN, Mallampati 4 airway, no stridor, wears dentures  Respiratory - equal breath sounds bilaterally, no wheezing or rales  CV - s1s2 regular rate and rhythm, no murmurs  Ext - no clubbing, no edema  Skin - no  rashes  Psych - normal mood and affect     Sleep Tests:  HST 01/29/21 >> AHI 9.9, SpO2 low 76%  Cardiac Tests:  Echo 06/19/22 >> EF 55%, severe LVH  Social History:  She  reports that she quit smoking about 19 years ago. Her smoking use included cigarettes. She started smoking about 44 years ago. She has a 20.00 pack-year smoking history. She has never used smokeless tobacco. She reports that she does not drink alcohol and does not use drugs.  Family History:  Her family history includes Arthritis in her daughter; COPD in her daughter; Cancer in her brother; Heart attack in her brother and father; Heart failure in her brother and father; Lupus in her daughter; Stroke in her sister.     Assessment/Plan:   Obstructive sleep apnea. - she is compliant with therapy and reports benefit from CPAP - she uses [facility] as her DME - current CPAP was ordered on 02/06/21 - will arrange for replacement mask and supplies - will have her auto CPAP changed to 5 - 10 cm H2O - discussed how untreated sleep apnea can impact her heart disease  Obesity. - discussed importance of weight loss  Persistent atrial fibrillation, Chronic diastolic CHF, Coronary artery disease. - she is followed by Dr. Johnny Bridge with [facility]  Time Spent Involved in Patient Care on Day of Examination:  35 minutes  Follow up:   Patient Instructions  Will arrange for replacement CPAP mask and supplies and have your auto CPAP setting change to 5 - 10 cm water  pressure   Follow up in 6 months  Medication List:   Allergies as of 07/11/2022       Reactions   Afrin [oxymetazoline] Anaphylaxis   Amoxicillin Anaphylaxis   Lisinopril Anaphylaxis   Ranolazine Anaphylaxis   Throat swells, Dizzy, Reaction occurred with generic med   Vicodin [hydrocodone-acetaminophen] Diarrhea, Nausea And Vomiting   Demerol [meperidine] Nausea And Vomiting   Flonase [fluticasone]    anaphylaxis   Lodine [etodolac]  Rash        Medication List        Accurate as of July 11, 2022  9:44 AM. If you have any questions, ask your nurse or doctor.          acetaminophen 500 MG tablet Commonly known as: TYLENOL Take 500 mg by mouth every 6 (six) hours as needed for mild pain or moderate pain.   amLODipine 2.5 MG tablet Commonly known as: NORVASC Take 1 tablet (2.5 mg total) by mouth daily.   apixaban 5 MG Tabs tablet Commonly known as: Eliquis Take 1 tablet (5 mg total) by mouth 2 (two) times daily.   ascorbic acid 500 MG tablet Commonly known as: VITAMIN C Take 500 mg by mouth daily.   atorvastatin 80 MG tablet Commonly known as: LIPITOR Take 1 tablet (80 mg total) by mouth at bedtime.   calcium carbonate 1250 (500 Ca) MG tablet Commonly known as: OS-CAL - dosed in mg of elemental calcium Take 500 mg by mouth daily.   diclofenac Sodium 1 % Gel Commonly known as: VOLTAREN Apply 1 application topically 4 (four) times daily as needed (pain).   dofetilide 125 MCG capsule Commonly known as: TIKOSYN Take 1 capsule (125 mcg total) by mouth 2 (two) times daily.   ferrous sulfate 325 (65 FE) MG EC tablet Take 1 tablet (325 mg total) by mouth daily.   furosemide 40 MG tablet Commonly known as: Lasix Take 1 tablet (40 mg total) by mouth daily.   gabapentin 300 MG capsule Commonly known as: NEURONTIN Take 1 capsule (300 mg total) by mouth 2 (two) times daily.   levothyroxine 100 MCG tablet Commonly known as: SYNTHROID Take 1 tablet (100 mcg total) by mouth daily before breakfast.   loratadine 10 MG tablet Commonly known as: CLARITIN Take 10 mg by mouth daily.   magnesium oxide 400 MG tablet Commonly known as: MAG-OX Take 0.5 tablets (200 mg total) by mouth daily.   melatonin 3 MG Tabs tablet Take 3 mg by mouth at bedtime.   metoprolol tartrate 25 MG tablet Commonly known as: LOPRESSOR Take 0.5 tablets (12.5 mg total) by mouth 2 (two) times daily.   nitroGLYCERIN 0.4 MG  SL tablet Commonly known as: NITROSTAT DISSOLVE ONE TABLET UNDER THE TONGUE EVERY 5 MINUTES AS NEEDED FOR CHEST PAIN.  DO NOT EXCEED A TOTAL OF 3 DOSES IN 15 MINUTES   ondansetron 4 MG disintegrating tablet Commonly known as: ZOFRAN-ODT Take 1 tablet (4 mg total) by mouth every 8 (eight) hours as needed for nausea or vomiting.   potassium chloride SA 20 MEQ tablet Commonly known as: KLOR-CON M Take 2 tablets (40 mEq total) by mouth daily.   spironolactone 25 MG tablet Commonly known as: ALDACTONE Take 1/2 (one-half) tablet by mouth once daily   Vitamin D3 50 MCG (2000 UT) capsule Take 1 capsule (2,000 Units total) by mouth daily.        Signature:  Chesley Mires, MD [place] Pager - [phone] 07/11/2022,  9:44 AM

## 2022-07-11 NOTE — Patient Instructions (Signed)
Will arrange for replacement CPAP mask and supplies and have your auto CPAP setting change to 5 - 10 cm water pressure   Follow up in 6 months

## 2022-07-16 ENCOUNTER — Ambulatory Visit (INDEPENDENT_AMBULATORY_CARE_PROVIDER_SITE_OTHER): Payer: Medicare Other | Admitting: *Deleted

## 2022-07-16 DIAGNOSIS — I5042 Chronic combined systolic (congestive) and diastolic (congestive) heart failure: Secondary | ICD-10-CM | POA: Diagnosis not present

## 2022-07-16 NOTE — Progress Notes (Signed)
Pt in today to have EKG. States that when she picked up new Rx the color of the pill was different and it made her feel fatigued. Pt states that she restarted the pills she had and the feeling went away. When asked pt states that she only took 1/2 tablet BID for both. Reviewed EKG with Dr. Domenic Polite and pt will continue lopressor a current dose.

## 2022-07-18 LAB — COMPREHENSIVE METABOLIC PANEL
ALT: 11 IU/L (ref 0–32)
AST: 15 IU/L (ref 0–40)
Albumin/Globulin Ratio: 1 — ABNORMAL LOW (ref 1.2–2.2)
Albumin: 3.7 g/dL — ABNORMAL LOW (ref 3.9–4.9)
Alkaline Phosphatase: 104 IU/L (ref 44–121)
BUN/Creatinine Ratio: 17 (ref 12–28)
BUN: 17 mg/dL (ref 8–27)
Bilirubin Total: 0.4 mg/dL (ref 0.0–1.2)
CO2: 22 mmol/L (ref 20–29)
Calcium: 9.1 mg/dL (ref 8.7–10.3)
Chloride: 101 mmol/L (ref 96–106)
Creatinine, Ser: 1 mg/dL (ref 0.57–1.00)
Globulin, Total: 3.7 g/dL (ref 1.5–4.5)
Glucose: 102 mg/dL — ABNORMAL HIGH (ref 70–99)
Potassium: 4.3 mmol/L (ref 3.5–5.2)
Sodium: 137 mmol/L (ref 134–144)
Total Protein: 7.4 g/dL (ref 6.0–8.5)
eGFR: 61 mL/min/{1.73_m2} (ref >59)

## 2022-07-18 LAB — TSH: TSH: 2.12 u[IU]/mL (ref 0.450–4.500)

## 2022-07-18 LAB — T4, FREE: Free T4: 1.66 ng/dL (ref 0.82–1.77)

## 2022-07-23 ENCOUNTER — Ambulatory Visit: Payer: Medicare Other | Admitting: "Endocrinology

## 2022-07-23 ENCOUNTER — Encounter: Payer: Self-pay | Admitting: "Endocrinology

## 2022-07-23 VITALS — BP 124/78 | HR 56 | Ht 62.5 in | Wt 217.0 lb

## 2022-07-23 DIAGNOSIS — E559 Vitamin D deficiency, unspecified: Secondary | ICD-10-CM

## 2022-07-23 DIAGNOSIS — E890 Postprocedural hypothyroidism: Secondary | ICD-10-CM | POA: Diagnosis not present

## 2022-07-23 MED ORDER — LEVOTHYROXINE SODIUM 100 MCG PO TABS: 100.0000 ug | ORAL_TABLET | Freq: Every day | ORAL | 1 refills | Status: DC

## 2022-07-23 NOTE — Progress Notes (Signed)
07/23/2022   Endocrinology follow-up note   Subjective:    Patient ID: Maria Fry, female    DOB: [birth year],    Past Medical History:  Diagnosis Date   Arthritis    Right knee   Atrial fibrillation ([place])    CAD (coronary artery disease)    a. 06/07/15 NSTEMI/Cath: Dist LAD 100%, mid LAD 10%. Otw nl cors. Nl EF w/ inferoapical AK.   Essential hypertension    Hip pain    History of cardiomyopathy 04/23/2016   a. 04/2016 Echo: EF 45%, Gr2 DD; b. 11/2019 Echo: EF 45-50%, mod LVH. Gr2 DD. Nl RV fxn. Sev dil LA.    Hyperlipidemia    Hypothyroidism    NSTEMI (non-ST elevated myocardial infarction) ([place]) 06/05/2015   Vitamin D deficiency    Past Surgical History:  Procedure Laterality Date   ABDOMINAL HYSTERECTOMY     partial- one ovary remaining   BIOPSY  09/17/2020   Procedure: BIOPSY;  Surgeon: [name], DO;  [facility];  Service: Endoscopy;;   CARDIAC CATHETERIZATION N/A 06/06/2015   Procedure: Left Heart Cath and Coronary Angiography;  Surgeon: Peter M Martinique, MD;  Location: [facility];  Service: Cardiovascular;  Laterality: N/A;   CARDIOVERSION N/A 09/14/2020   Procedure: CARDIOVERSION;  Surgeon: Satira Sark, MD;  Location: [facility];  Service: Cardiovascular;  Laterality: N/A;   CARDIOVERSION N/A 06/20/2022   Procedure: CARDIOVERSION;  Surgeon: Arnoldo Lenis, MD;  Location: [facility];  Service: Endoscopy;  Laterality: N/A;   CHOLECYSTECTOMY     COLONOSCOPY     COLONOSCOPY N/A 08/13/2017   Procedure: COLONOSCOPY;  Surgeon: Daneil Dolin, MD;  [facility];  Service: Endoscopy;  Laterality: N/A;  8:30 AM   COLONOSCOPY WITH PROPOFOL N/A 01/18/2021   [name]: Multiple tubular adenomas removed, next colonoscopy in 3 years   ESOPHAGOGASTRODUODENOSCOPY N/A 09/11/2016   Procedure: ESOPHAGOGASTRODUODENOSCOPY (EGD);  Surgeon: Daneil Dolin, MD;  [facility];  Service: Endoscopy;  Laterality: N/A;  7:45 am - moved to 10/11  @ 10:30   ESOPHAGOGASTRODUODENOSCOPY (EGD) WITH PROPOFOL N/A 09/17/2020   [name]: Diffuse moderate inflammation characterized by erosions and erythema found in the entire stomach.  Biopsies positive for H. pylori.  Patient has not been treated due to drug allergies.   Heel spurs Left    MALONEY DILATION N/A 09/11/2016   Procedure: Venia Minks DILATION;  Surgeon: Daneil Dolin, MD;  [facility];  Service: Endoscopy;  Laterality: N/A;   POLYPECTOMY  08/13/2017   Procedure: POLYPECTOMY;  Surgeon: Daneil Dolin, MD;  [facility];  Service: Endoscopy;;  colon   POLYPECTOMY  01/18/2021   Procedure: POLYPECTOMY;  Surgeon: Daneil Dolin, MD;  [facility];  Service: Endoscopy;;   SHOULDER ARTHROSCOPY Right    TUBAL LIGATION     Social History   Socioeconomic History   Marital status: Married    Spouse name: Not on file   Number of children: 3   Years of education: 10   Highest education level: Not on file  Occupational History   Occupation: team leader at [employer]  Tobacco Use   Smoking status: Former    Packs/day: 1.00    Years: 20.00    Total pack years: 20.00    Types: Cigarettes    Start date: 12/02/1977    Quit date: 12/02/2002    Years since quitting: 19.6   Smokeless tobacco: Never  Tobacco comments:    Former smoker 05/10/22  Vaping Use   Vaping Use: Never used  Substance and Sexual Activity   Alcohol use: No    Alcohol/week: 0.0 standard drinks of alcohol   Drug use: No   Sexual activity: Not Currently  Other Topics Concern   Not on file  Social History Narrative   Retired from [employer].   Social Determinants of Health   Financial Resource Strain: Low Risk  (11/28/2021)   Overall Financial Resource Strain (CARDIA)    Difficulty of Paying Living Expenses: Not hard at all  Food Insecurity: No Food Insecurity (11/28/2021)   Hunger Vital Sign    Worried About Running Out of Food in the Last Year: Never true    Ran Out of  Food in the Last Year: Never true  Transportation Needs: No Transportation Needs (11/28/2021)   PRAPARE - Hydrologist (Medical): No    Lack of Transportation (Non-Medical): No  Physical Activity: Insufficiently Active (11/28/2021)   Exercise Vital Sign    Days of Exercise per Week: 5 days    Minutes of Exercise per Session: 10 min  Stress: No Stress Concern Present (11/28/2021)   [place]    Feeling of Stress : Not at all  Social Connections: Moderately Integrated (11/28/2021)   Social Connection and Isolation Panel [NHANES]    Frequency of Communication with Friends and Family: More than three times a week    Frequency of Social Gatherings with Friends and Family: More than three times a week    Attends Religious Services: More than 4 times per year    Active Member of [employer] or Organizations: No    Attends Archivist Meetings: Never    Marital Status: Married   Outpatient Encounter Medications as of 07/23/2022  Medication Sig   acetaminophen (TYLENOL) 500 MG tablet Take 500 mg by mouth every 6 (six) hours as needed for mild pain or moderate pain.   amLODipine (NORVASC) 2.5 MG tablet Take 1 tablet (2.5 mg total) by mouth daily.   apixaban (ELIQUIS) 5 MG TABS tablet Take 1 tablet (5 mg total) by mouth 2 (two) times daily.   atorvastatin (LIPITOR) 80 MG tablet Take 1 tablet (80 mg total) by mouth at bedtime.   calcium carbonate (OS-CAL - DOSED IN MG OF ELEMENTAL CALCIUM) 1250 (500 Ca) MG tablet Take 500 mg by mouth daily.   Cholecalciferol (VITAMIN D3) 50 MCG (2000 UT) capsule Take 1 capsule (2,000 Units total) by mouth daily.   diclofenac Sodium (VOLTAREN) 1 % GEL Apply 1 application topically 4 (four) times daily as needed (pain).   dofetilide (TIKOSYN) 125 MCG capsule Take 1 capsule (125 mcg total) by mouth 2 (two) times daily.   ferrous sulfate 325 (65 FE) MG EC tablet Take 1  tablet (325 mg total) by mouth daily.   furosemide (LASIX) 40 MG tablet Take 1 tablet (40 mg total) by mouth daily.   gabapentin (NEURONTIN) 300 MG capsule Take 1 capsule (300 mg total) by mouth 2 (two) times daily.   levothyroxine (SYNTHROID) 100 MCG tablet Take 1 tablet (100 mcg total) by mouth daily before breakfast.   loratadine (CLARITIN) 10 MG tablet Take 10 mg by mouth daily.   magnesium oxide (MAG-OX) 400 MG tablet Take 0.5 tablets (200 mg total) by mouth daily.   melatonin 3 MG TABS tablet Take 3 mg by mouth at bedtime.   metoprolol tartrate (  LOPRESSOR) 25 MG tablet Take 0.5 tablets (12.5 mg total) by mouth 2 (two) times daily.   nitroGLYCERIN (NITROSTAT) 0.4 MG SL tablet DISSOLVE ONE TABLET UNDER THE TONGUE EVERY 5 MINUTES AS NEEDED FOR CHEST PAIN.  DO NOT EXCEED A TOTAL OF 3 DOSES IN 15 MINUTES (Patient taking differently: DISSOLVE ONE TABLET UNDER THE TONGUE EVERY 5 MINUTES AS NEEDED FOR CHEST PAIN.  DO NOT EXCEED A TOTAL OF 3 DOSES IN 15 MINUTES)   ondansetron (ZOFRAN-ODT) 4 MG disintegrating tablet Take 1 tablet (4 mg total) by mouth every 8 (eight) hours as needed for nausea or vomiting.   potassium chloride SA (KLOR-CON M) 20 MEQ tablet Take 2 tablets (40 mEq total) by mouth daily.   spironolactone (ALDACTONE) 25 MG tablet Take 1/2 (one-half) tablet by mouth once daily   vitamin C (ASCORBIC ACID) 500 MG tablet Take 500 mg by mouth daily.   [DISCONTINUED] levothyroxine (SYNTHROID) 100 MCG tablet Take 1 tablet (100 mcg total) by mouth daily before breakfast.   No facility-administered encounter medications on file as of 07/23/2022.   ALLERGIES: Allergies  Allergen Reactions   Afrin [Oxymetazoline] Anaphylaxis   Amoxicillin Anaphylaxis   Lisinopril Anaphylaxis   Ranolazine Anaphylaxis    Throat swells, Dizzy, Reaction occurred with generic med   Vicodin [Hydrocodone-Acetaminophen] Diarrhea and Nausea And Vomiting   Demerol [Meperidine] Nausea And Vomiting   Flonase  [Fluticasone]     anaphylaxis   Lodine [Etodolac] Rash   VACCINATION STATUS: Immunization History  Administered Date(s) Administered   Fluad Quad(high Dose 65+) 10/21/2019, 08/18/2020, 09/05/2021   Influenza, High Dose Seasonal PF 08/24/2018   Influenza,inj,Quad PF,6+ Mos 10/02/2015, 08/19/2016, 08/25/2017, 09/02/2019   PFIZER(Purple Top)SARS-COV-2 Vaccination 01/24/2020, 02/14/2020, 10/31/2020, 06/29/2021   Pneumococcal Conjugate-13 01/01/2019   Pneumococcal Polysaccharide-23 01/03/2020    Thyroid Problem Presents for follow-up (She was given RAI therapy for Graves' disease in the middle of September 2016.  She is currently on levothyroxine 100 mcg for a subsequent RAI induced hypothyroidism.) visit. Patient reports no cold intolerance, diarrhea, fatigue, heat intolerance or palpitations. The symptoms have been improving (She denies palpitations, tremors, heat/cold intolerance.   She denies dysphagia or shortness of breath, nor voice change.). Past treatments include iodine 131. The treatment provided significant relief. Risk factors include prior iodine 131 therapy.   Review of systems : Limited as above.  Objective:    BP 124/78   Pulse (!) 56   Ht 5' 2.5" (1.588 m)   Wt 217 lb (98.4 kg)   BMI 39.06 kg/m   Wt Readings from Last 3 Encounters:  07/23/22 217 lb (98.4 kg)  07/11/22 220 lb 9.6 oz (100.1 kg)  07/08/22 220 lb (99.8 kg)      Results for orders placed or performed in visit on 97/35/32  Basic Metabolic Panel (BMET)  Result Value Ref Range   Glucose 93 70 - 99 mg/dL   BUN 18 8 - 27 mg/dL   Creatinine, Ser 1.01 (H) 0.57 - 1.00 mg/dL   eGFR 60 >59 mL/min/1.73   BUN/Creatinine Ratio 18 12 - 28   Sodium 141 134 - 144 mmol/L   Potassium 4.3 3.5 - 5.2 mmol/L   Chloride 103 96 - 106 mmol/L   CO2 24 20 - 29 mmol/L   Calcium 8.9 8.7 - 10.3 mg/dL  CBC  Result Value Ref Range   WBC 8.4 3.4 - 10.8 x10E3/uL   RBC 3.61 (L) 3.77 - 5.28 x10E6/uL   Hemoglobin 8.2 (L) 11.1  - 15.9 g/dL  Hematocrit 27.7 (L) 34.0 - 46.6 %   MCV 77 (L) 79 - 97 fL   MCH 22.7 (L) 26.6 - 33.0 pg   MCHC 29.6 (L) 31.5 - 35.7 g/dL   RDW 19.5 (H) 11.7 - 15.4 %   Platelets 428 150 - 450 x10E3/uL     Assessment & Plan:   1.  RAI induced Hypothyroidism:    2.  Vitamin D deficiency    She is status post RAI therapy in September 2016 For hyperthyroidism. -Her previsit thyroid function tests are consistent with appropriate replacement.  She is advised to continue hydroxyzine 100 mcg p.o. daily before breakfast.     - We discussed about the correct intake of her thyroid hormone, on empty stomach at fasting, with water, separated by at least 30 minutes from breakfast and other medications,  and separated by more than 4 hours from calcium, iron, multivitamins, acid reflux medications (PPIs). -Patient is made aware of the fact that thyroid hormone replacement is needed for life, dose to be adjusted by periodic monitoring of thyroid function tests.   She has mild chronic hypocalcemia.  She is advised to resume and continue her calcium carbonate 500 mg p.o. once a day at lunch or supper.  Regarding her vitamin D deficiency, she admits that she has not been consistent taking her vitamin D3.  I advised her to be consistent and continue vitamin D3 2000 units daily.   I advised patient to maintain close follow up with her PCP for primary care needs.   I spent 23 minutes in the care of the patient today including review of labs from Thyroid Function, CMP, and other relevant labs ; imaging/biopsy records (current and previous including abstractions from other facilities); face-to-face time discussing  her lab results and symptoms, medications doses, her options of short and long term treatment based on the latest standards of care / guidelines;   and documenting the encounter.  Maria Fry  participated in the discussions, expressed understanding, and voiced agreement with the above plans.  All  questions were answered to her satisfaction. she is encouraged to contact clinic should she have any questions or concerns prior to her return visit.   Follow up plan: Return in about 6 months (around 01/23/2023) for F/U with Pre-visit Labs.  Glade Lloyd, MD Phone: [phone]  Fax: [phone]  -  This note was partially dictated with voice recognition software. Similar sounding words can be transcribed inadequately or may not  be corrected upon review.  07/23/2022, 11:18 AM

## 2022-07-30 ENCOUNTER — Encounter (HOSPITAL_COMMUNITY): Payer: Self-pay | Admitting: Physical Therapy

## 2022-07-30 ENCOUNTER — Ambulatory Visit (HOSPITAL_COMMUNITY): Payer: Medicare Other | Attending: Internal Medicine | Admitting: Physical Therapy

## 2022-07-30 DIAGNOSIS — I202 Refractory angina pectoris: Secondary | ICD-10-CM | POA: Insufficient documentation

## 2022-07-30 DIAGNOSIS — M6281 Muscle weakness (generalized): Secondary | ICD-10-CM | POA: Insufficient documentation

## 2022-07-30 NOTE — Therapy (Signed)
[place], [place], [address] Phone: [phone]   Fax:  [phone]  Patient Details  Name: Maria Fry MRN: [id number] Date of Birth: 1951-09-05 Referring Provider:  Barton Dubois, MD  Encounter Date: 07/30/2022  Patient presents for PT evaluation today. Patient states her function is at baseline, and her only issue currently is with her heart. She was recently hospitalized for cardiac issues and cites reduced cardiac output. Patient reports independent function in home. Performed screen on MMT, transfers and gross balance. Patient is stable ambulating with no AD, transfers independently and demos 4+ MMT throughout BLE. No obvious need for skilled therapy services at this time. Encouraged patient to follow up with cardiac rehab, and contact physical therapy with any further questions or concerns.   10:18 AM, 07/30/22 Josue Hector PT DPT  Physical Therapist with [facility]  [phone] [address] [place], [place] 07/30/2022, 10:15 AM  [place], [place], [address] Phone: [phone]   Fax:  [phone]

## 2022-08-07 ENCOUNTER — Other Ambulatory Visit: Payer: Self-pay | Admitting: Cardiology

## 2022-08-20 ENCOUNTER — Telehealth: Payer: Self-pay | Admitting: Cardiology

## 2022-08-20 MED ORDER — AMLODIPINE BESYLATE 2.5 MG PO TABS: 2.5000 mg | ORAL_TABLET | Freq: Every day | ORAL | 3 refills | Status: DC

## 2022-08-20 NOTE — Telephone Encounter (Signed)
*  STAT* If patient is at the pharmacy, call can be transferred to refill team.   1. Which medications need to be refilled? (please list name of each medication and dose if known)   amLODipine (NORVASC) 2.5 MG tablet    2. Which pharmacy/location (including street and city if local pharmacy) is medication to be sent to? [place], [place] - [address]  3. Do they need a 30 day or 90 day supply?  [place]

## 2022-08-20 NOTE — Telephone Encounter (Signed)
Completed.

## 2022-09-05 ENCOUNTER — Emergency Department (HOSPITAL_COMMUNITY)
Admission: EM | Admit: 2022-09-05 | Discharge: 2022-09-05 | Disposition: A | Discharge status: Home / Self Care | Payer: Medicare Other | Attending: Emergency Medicine | Admitting: Emergency Medicine

## 2022-09-05 ENCOUNTER — Telehealth: Payer: Self-pay | Admitting: Internal Medicine

## 2022-09-05 ENCOUNTER — Telehealth: Payer: Self-pay | Admitting: Cardiology

## 2022-09-05 ENCOUNTER — Encounter: Payer: Self-pay | Admitting: Internal Medicine

## 2022-09-05 ENCOUNTER — Emergency Department (HOSPITAL_COMMUNITY): Payer: Medicare Other

## 2022-09-05 ENCOUNTER — Ambulatory Visit (INDEPENDENT_AMBULATORY_CARE_PROVIDER_SITE_OTHER): Payer: Medicare Other | Admitting: Internal Medicine

## 2022-09-05 ENCOUNTER — Ambulatory Visit: Payer: Medicare Other | Admitting: Nurse Practitioner

## 2022-09-05 ENCOUNTER — Other Ambulatory Visit: Payer: Self-pay

## 2022-09-05 ENCOUNTER — Encounter (HOSPITAL_COMMUNITY): Payer: Self-pay | Admitting: *Deleted

## 2022-09-05 VITALS — BP 127/67 | HR 76 | Ht 63.0 in | Wt 217.4 lb

## 2022-09-05 DIAGNOSIS — R0602 Shortness of breath: Secondary | ICD-10-CM | POA: Diagnosis present

## 2022-09-05 DIAGNOSIS — Z7901 Long term (current) use of anticoagulants: Secondary | ICD-10-CM | POA: Diagnosis not present

## 2022-09-05 DIAGNOSIS — I4891 Unspecified atrial fibrillation: Secondary | ICD-10-CM | POA: Diagnosis not present

## 2022-09-05 DIAGNOSIS — Z79899 Other long term (current) drug therapy: Secondary | ICD-10-CM | POA: Insufficient documentation

## 2022-09-05 DIAGNOSIS — I482 Chronic atrial fibrillation, unspecified: Secondary | ICD-10-CM

## 2022-09-05 DIAGNOSIS — I480 Paroxysmal atrial fibrillation: Secondary | ICD-10-CM | POA: Diagnosis not present

## 2022-09-05 LAB — CBC WITH DIFFERENTIAL/PLATELET
Abs Immature Granulocytes: 0.02 10*3/uL (ref 0.00–0.07)
Basophils Absolute: 0 10*3/uL (ref 0.0–0.1)
Basophils Relative: 0 %
Eosinophils Absolute: 0.1 10*3/uL (ref 0.0–0.5)
Eosinophils Relative: 1 %
HCT: 30.6 % — ABNORMAL LOW (ref 36.0–46.0)
Hemoglobin: 9.4 g/dL — ABNORMAL LOW (ref 12.0–15.0)
Immature Granulocytes: 0 %
Lymphocytes Relative: 32 %
Lymphs Abs: 2.5 10*3/uL (ref 0.7–4.0)
MCH: 24.5 pg — ABNORMAL LOW (ref 26.0–34.0)
MCHC: 30.7 g/dL (ref 30.0–36.0)
MCV: 79.9 fL — ABNORMAL LOW (ref 80.0–100.0)
Monocytes Absolute: 0.4 10*3/uL (ref 0.1–1.0)
Monocytes Relative: 5 %
Neutro Abs: 4.9 10*3/uL (ref 1.7–7.7)
Neutrophils Relative %: 62 %
Platelets: 378 10*3/uL (ref 150–400)
RBC: 3.83 MIL/uL — ABNORMAL LOW (ref 3.87–5.11)
RDW: 23.8 % — ABNORMAL HIGH (ref 11.5–15.5)
WBC: 7.9 10*3/uL (ref 4.0–10.5)
nRBC: 0 % (ref 0.0–0.2)

## 2022-09-05 LAB — BASIC METABOLIC PANEL
Anion gap: 7 (ref 5–15)
BUN: 18 mg/dL (ref 8–23)
CO2: 25 mmol/L (ref 22–32)
Calcium: 8.7 mg/dL — ABNORMAL LOW (ref 8.9–10.3)
Chloride: 104 mmol/L (ref 98–111)
Creatinine, Ser: 1.01 mg/dL — ABNORMAL HIGH (ref 0.44–1.00)
GFR, Estimated: >60 mL/min (ref >60)
Glucose, Bld: 96 mg/dL (ref 70–99)
Potassium: 4 mmol/L (ref 3.5–5.1)
Sodium: 136 mmol/L (ref 135–145)

## 2022-09-05 NOTE — Discharge Instructions (Signed)
Cardiology recommends holding off on cardioversion.  They will contact you to tomorrow and schedule an outpatient monitor to get a better look at your atrial fibrillation.  Follow-up with your primary care doctor for further evaluation.  Return to the emergency room for any worsening symptoms.

## 2022-09-05 NOTE — ED Provider Notes (Signed)
[facility] EMERGENCY DEPARTMENT Provider Note   CSN: [id number] Arrival date & time: 09/05/22  1207     History Chief Complaint  Patient presents with   Shortness of Breath    Maria Fry is a 71 y.o. female patient with paroxysmal chronic atrial fibrillation on Eliquis daily who presents to the emergency department today with lightheadedness and fatigue is been ongoing for last couple of days.  Patient was seen and evaluated at her primary care doctor where she was found to be in atrial fibrillation with a normal heart rate.  Her PCP contacted her cardiologist who recommended coming here for a cardioversion. She denies any chest pain or shortness of breath.    Shortness of Breath      Home Medications Prior to Admission medications   Medication Sig Start Date End Date Taking? Authorizing Provider  acetaminophen (TYLENOL) 500 MG tablet Take 500 mg by mouth every 6 (six) hours as needed for mild pain or moderate pain.    [provider]  amLODipine (NORVASC) 2.5 MG tablet Take 1 tablet (2.5 mg total) by mouth daily. 08/20/22   Satira Sark, MD  apixaban (ELIQUIS) 5 MG TABS tablet Take 1 tablet (5 mg total) by mouth 2 (two) times daily. 09/12/21   Fenton, Clint R, PA  atorvastatin (LIPITOR) 80 MG tablet Take 1 tablet (80 mg total) by mouth at bedtime. 04/25/22   Paseda, Dewaine Conger, FNP  calcium carbonate (OS-CAL - DOSED IN MG OF ELEMENTAL CALCIUM) 1250 (500 Ca) MG tablet Take 500 mg by mouth daily.    [provider]  Cholecalciferol (VITAMIN D3) 50 MCG (2000 UT) capsule Take 1 capsule (2,000 Units total) by mouth daily. 03/07/22   Renee Rival, FNP  diclofenac Sodium (VOLTAREN) 1 % GEL Apply 1 application topically 4 (four) times daily as needed (pain).    [provider]  dofetilide (TIKOSYN) 125 MCG capsule Take 1 capsule (125 mcg total) by mouth 2 (two) times daily. 01/09/22   Fenton, Clint R, PA  ferrous sulfate 325 (65 FE) MG EC tablet Take 1  tablet (325 mg total) by mouth daily. 07/02/22   Alvira Monday, FNP  furosemide (LASIX) 40 MG tablet Take 1 tablet (40 mg total) by mouth daily. 06/21/22   Barton Dubois, MD  gabapentin (NEURONTIN) 300 MG capsule Take 1 capsule (300 mg total) by mouth 2 (two) times daily. 09/10/21   Noreene Larsson, NP  levothyroxine (SYNTHROID) 100 MCG tablet Take 1 tablet (100 mcg total) by mouth daily before breakfast. 07/23/22   Nida, Marella Chimes, MD  loratadine (CLARITIN) 10 MG tablet Take 10 mg by mouth daily.    [provider]  magnesium oxide (MAG-OX) 400 MG tablet Take 0.5 tablets (200 mg total) by mouth daily. 10/24/20   Fenton, Clint R, PA  melatonin 3 MG TABS tablet Take 3 mg by mouth at bedtime.    [provider]  metoprolol tartrate (LOPRESSOR) 25 MG tablet Take 0.5 tablets (12.5 mg total) by mouth 2 (two) times daily. 07/08/22   Imogene Burn, PA-C  nitroGLYCERIN (NITROSTAT) 0.4 MG SL tablet DISSOLVE ONE TABLET UNDER THE TONGUE EVERY 5 MINUTES AS NEEDED FOR CHEST PAIN.  DO NOT EXCEED A TOTAL OF 3 DOSES IN 15 MINUTES Patient taking differently: DISSOLVE ONE TABLET UNDER THE TONGUE EVERY 5 MINUTES AS NEEDED FOR CHEST PAIN.  DO NOT EXCEED A TOTAL OF 3 DOSES IN 15 MINUTES 01/08/21   Verta Ellen., NP  ondansetron (ZOFRAN-ODT) 4 MG disintegrating tablet Take 1 tablet (4 mg total) by mouth every 8 (eight) hours as needed for nausea or vomiting. 10/25/21   Maudie Flakes, MD  potassium chloride SA (KLOR-CON M) 20 MEQ tablet Take 2 tablets (40 mEq total) by mouth daily. 06/21/22   Barton Dubois, MD  spironolactone (ALDACTONE) 25 MG tablet Take 1/2 (one-half) tablet by mouth once daily 08/07/22   Satira Sark, MD  vitamin C (ASCORBIC ACID) 500 MG tablet Take 500 mg by mouth daily.    [provider]      Allergies    Afrin [oxymetazoline], Amoxicillin, Lisinopril, Ranolazine, Vicodin [hydrocodone-acetaminophen], Demerol [meperidine], Flonase [fluticasone], and Lodine  [etodolac]    Review of Systems   Review of Systems  Respiratory:  Positive for shortness of breath.   All other systems reviewed and are negative.   Physical Exam Updated Vital Signs BP 138/75 (BP Location: Left Arm)   Pulse 61   Temp (!) 97.4 F (36.3 C) (Oral)   Resp 13   Ht '5\' 3"'$  (1.6 m)   Wt 98.4 kg   SpO2 96%   BMI 38.44 kg/m  Physical Exam Vitals and nursing note reviewed.  Constitutional:      General: She is not in acute distress.    Appearance: Normal appearance.  HENT:     Head: Normocephalic and atraumatic.  Eyes:     General:        Right eye: No discharge.        Left eye: No discharge.  Cardiovascular:     Rate and Rhythm: Normal rate. Rhythm irregularly irregular.     Comments: S1/S2 are distinct without any evidence of murmur, rubs, or gallops.  Radial pulses are 2+ bilaterally.  Dorsalis pedis pulses are 2+ bilaterally.  No evidence of pedal edema. Pulmonary:     Comments: Clear to auscultation bilaterally.  Normal effort.  No respiratory distress.  No evidence of wheezes, rales, or rhonchi heard throughout. Abdominal:     General: Abdomen is flat. Bowel sounds are normal. There is no distension.     Tenderness: There is no abdominal tenderness. There is no guarding or rebound.  Musculoskeletal:        General: Normal range of motion.     Cervical back: Neck supple.  Skin:    General: Skin is warm and dry.     Findings: No rash.  Neurological:     General: No focal deficit present.     Mental Status: She is alert.  Psychiatric:        Mood and Affect: Mood normal.        Behavior: Behavior normal.     ED Results / Procedures / Treatments   Labs (all labs ordered are listed, but only abnormal results are displayed) Labs Reviewed  CBC WITH DIFFERENTIAL/PLATELET - Abnormal; Notable for the following components:      Result Value   RBC 3.83 (*)    Hemoglobin 9.4 (*)    HCT 30.6 (*)    MCV 79.9 (*)    MCH 24.5 (*)    RDW 23.8 (*)    All  other components within normal limits  BASIC METABOLIC PANEL - Abnormal; Notable for the following components:   Creatinine, Ser 1.01 (*)    Calcium 8.7 (*)    All other components within normal limits    EKG None  Radiology DG Chest 2 View  Result Date: 09/05/2022 CLINICAL DATA:  Shortness of  breath over the last couple days, atrial fibrillation EXAM: CHEST - 2 VIEW COMPARISON:  06/18/2022 FINDINGS: Enlargement of cardiac silhouette. Mediastinal contours and pulmonary vascularity normal. Atherosclerotic calcification aorta. Minimal linear scarring lingula, with lungs otherwise clear. No pulmonary infiltrate, pleural effusion, or pneumothorax. Osseous structures unremarkable. IMPRESSION: Enlargement of cardiac silhouette without acute abnormalities. Aortic Atherosclerosis (ICD10-I70.0). Electronically Signed   By: Lavonia Dana M.D.   On: 09/05/2022 14:02    Procedures Procedures    Medications Ordered in ED Medications - No data to display  ED Course/ Medical Decision Making/ A&P Clinical Course as of 09/05/22 1523  Thu Sep 05, 2022  3299 Basic metabolic panel(!) Normal. [CF]  1521 CBC with Differential(!) There is no evidence of leukocytosis but there is evidence of anemia which seems to be at the baseline per patient. [CF]    Clinical Course User Index [CF] Hendricks Limes, PA-C                           Medical Decision Making Maria Fry is a 71 y.o. female patient who presents to the emergency department today for further evaluation of fatigue, lightheadedness, and found to be in atrial fibrillation.  I will get basic labs, EKG, and chest x-ray to further evaluate.  EKG does show that she is in atrial fibrillation with a normal heart rate.  Vital signs are stable at this time.  Once labs and imaging result, I will contact cardiology for further recommendations.  Per cardiology, they do not recommend cardioversion at this time.  They will contact the patient tomorrow for an  outpatient monitor.  Patient's labs are all reassuring today.  Heart rate is controlled.  She is safe for discharge at this time.  Strict return precautions were discussed.  Amount and/or Complexity of Data Reviewed Labs: ordered. Radiology: ordered.   Final Clinical Impression(s) / ED Diagnoses Final diagnoses:  Paroxysmal atrial fibrillation [facility])    Rx / DC Orders ED Discharge Orders     None         Cherrie Gauze 09/05/22 1523    Noemi Chapel, MD 09/06/22 1229

## 2022-09-05 NOTE — Telephone Encounter (Signed)
[place] discussed

## 2022-09-05 NOTE — ED Triage Notes (Signed)
Pt c/o feeling sob for the last couple of days and states she is in afib

## 2022-09-05 NOTE — Telephone Encounter (Signed)
Dr. Doren Custard is calling requesting to speak with Dr. Domenic Polite due to the patient being in the office currently in afib and is symptomatic.

## 2022-09-05 NOTE — ED Provider Notes (Signed)
71 year old female with a known history of paroxysmal atrial fibrillation presenting with a complaint of mild palpitations which been going on for several days.  They were not severe enough for her to go to her doctor but when she went for her routine checkup and they saw that she was in atrial fibrillation they sent her over for possible cardioversion.  I have seen the patient, she is in no distress with a heart rate of around 60, normal pulses, unremarkable exam, I discussed her case with Dr. Harrington Challenger who will have the clinic contact the patient for outpatient monitoring and recommends against emergent cardioversion at this time.  I think that is reasonable since she appears still stable.  She is appropriately anticoagulated on Eliquis, stable for discharge  Medical screening examination/treatment/procedure(s) were conducted as a shared visit with non-physician practitioner(s) and myself.  I personally evaluated the patient during the encounter.  Clinical Impression:   Final diagnoses:  Paroxysmal atrial fibrillation (HCC)         Noemi Chapel, MD 09/06/22 1229

## 2022-09-05 NOTE — Telephone Encounter (Signed)
Got call from ER Dr (Dr Meda Coffee)    Pt seen in PCP office   IN afib   Asymptomatic   Sent to ER anyway   In afib  Rates controlled   Pt comfortable     Sent out    On anticoagulation Would recomm 3 wk moitor   Forward results to primary cardiologist. Pt is on tikosyn   Need to evaluate burden

## 2022-09-05 NOTE — Patient Instructions (Signed)
It was a pleasure to see you today.  Thank you for giving [place] the opportunity to be involved in your care.  Below is a brief recap of your visit and next steps.  We will plan to see you again in 2 weeks.  Summary I recommend you present to the emergency department for cardioversion to get your heart back into the appropriate rhythm.  Next steps Follow up in 2 weeks

## 2022-09-05 NOTE — Progress Notes (Signed)
Established Patient Office Visit  Subjective   Patient ID: Maria Fry, female    DOB: 27-Sep-1951  Age: 71 y.o. MRN: [id number]  Chief Complaint  Patient presents with   Follow-up   Heart Problem    Feels like heart is out of rhythm again. Been more tired than usual. Some of symptoms are the same as when it was out of rhythm the last time.   [name] returns to care today for a follow-up appointment.  She is a 71 year old woman with a past medical history significant for atrial fibrillation, CAD, HFrEF with recovered EF, HTN, CAD, OSA, GERD, acquired hypothyroidism, HLD, and vitamin D deficiency.  She was last seen at [facility] by Alvira Monday, NP for a [facility] appointment following hospital admission in July.  In the interim, she has been seen in follow-up by endocrinology, pulmonology, and cardiology.  Today [name] states that she feels poorly.  She believes that she is back in A-fib.  [name] endorses symptoms of dyspnea on exertion for the last 2 days.  She has difficulty catching her breath with minimal exertion such as walking from her car into her home.  She also endorses dizziness and lumbar back pain.  Both of these symptoms were present the last time she was in A-fib.  She denies orthopnea/PND and lower extremity edema. Of note, [name] required DCCV in July due to persistent atrial fibrillation.  She had cardiology follow-up on 8/7 at which time she was NSR.  She has been taking Eliquis 5 mg twice daily, metoprolol tartrate 12.5 mg twice daily, and Tikosyn 125 mcg twice daily as previously prescribed.  Past Medical History:  Diagnosis Date   Arthritis    Right knee   Atrial fibrillation (HCC)    CAD (coronary artery disease)    a. 06/07/15 NSTEMI/Cath: Dist LAD 100%, mid LAD 10%. Otw nl cors. Nl EF w/ inferoapical AK.   Essential hypertension    Hip pain    History of cardiomyopathy 04/23/2016   a. 04/2016 Echo: EF 45%, Gr2 DD; b. 11/2019 Echo: EF 45-50%, mod LVH. Gr2 DD. Nl RV  fxn. Sev dil LA.    Hyperlipidemia    Hypothyroidism    NSTEMI (non-ST elevated myocardial infarction) ([place]) 06/05/2015   Vitamin D deficiency    Past Surgical History:  Procedure Laterality Date   ABDOMINAL HYSTERECTOMY     partial- one ovary remaining   BIOPSY  09/17/2020   Procedure: BIOPSY;  Surgeon: [name], DO;  [facility];  Service: Endoscopy;;   CARDIAC CATHETERIZATION N/A 06/06/2015   Procedure: Left Heart Cath and Coronary Angiography;  Surgeon: Peter M Martinique, MD;  Location: [place] CV LAB;  Service: Cardiovascular;  Laterality: N/A;   CARDIOVERSION N/A 09/14/2020   Procedure: CARDIOVERSION;  Surgeon: Satira Sark, MD;  [facility];  Service: Cardiovascular;  Laterality: N/A;   CARDIOVERSION N/A 06/20/2022   Procedure: CARDIOVERSION;  Surgeon: Arnoldo Lenis, MD;  [facility]: [facility];  Service: Endoscopy;  Laterality: N/A;   CHOLECYSTECTOMY     COLONOSCOPY     COLONOSCOPY N/A 08/13/2017   Procedure: COLONOSCOPY;  Surgeon: Daneil Dolin, MD;  [facility];  Service: Endoscopy;  Laterality: N/A;  8:30 AM   COLONOSCOPY WITH PROPOFOL N/A 01/18/2021   [name]: Multiple tubular adenomas removed, next colonoscopy in 3 years   ESOPHAGOGASTRODUODENOSCOPY N/A 09/11/2016   Procedure: ESOPHAGOGASTRODUODENOSCOPY (EGD);  Surgeon: Daneil Dolin, MD;  [facility];  Service: Endoscopy;  Laterality: N/A;  7:45 am - moved to 10/11 @ 10:30   ESOPHAGOGASTRODUODENOSCOPY (EGD) WITH PROPOFOL N/A 09/17/2020   [name]: Diffuse moderate inflammation characterized by erosions and erythema found in the entire stomach.  Biopsies positive for H. pylori.  Patient has not been treated due to drug allergies.   Heel spurs Left    MALONEY DILATION N/A 09/11/2016   Procedure: Venia Minks DILATION;  Surgeon: Daneil Dolin, MD;  [facility];  Service: Endoscopy;  Laterality: N/A;   POLYPECTOMY  08/13/2017   Procedure: POLYPECTOMY;  Surgeon:  Daneil Dolin, MD;  [facility];  Service: Endoscopy;;  colon   POLYPECTOMY  01/18/2021   Procedure: POLYPECTOMY;  Surgeon: Daneil Dolin, MD;  [facility];  Service: Endoscopy;;   SHOULDER ARTHROSCOPY Right    TUBAL LIGATION     Social History   Tobacco Use   Smoking status: Former    Packs/day: 1.00    Years: 20.00    Total pack years: 20.00    Types: Cigarettes    Start date: 12/02/1977    Quit date: 12/02/2002    Years since quitting: 19.7   Smokeless tobacco: Never   Tobacco comments:    Former smoker 05/10/22  Vaping Use   Vaping Use: Never used  Substance Use Topics   Alcohol use: No    Alcohol/week: 0.0 standard drinks of alcohol   Drug use: No   Family History  Problem Relation Age of Onset   Heart failure Father        Deceased   Heart attack Father        Deceased   Stroke Sister        Deceased   Heart failure Brother        Deceased   Cancer Brother        Deceased   Heart attack Brother        Deceased   COPD Daughter    Arthritis Daughter    Lupus Daughter    Colon cancer Neg Hx    Allergies  Allergen Reactions   Afrin [Oxymetazoline] Anaphylaxis   Amoxicillin Anaphylaxis   Lisinopril Anaphylaxis   Ranolazine Anaphylaxis    Throat swells, Dizzy, Reaction occurred with generic med   Vicodin [Hydrocodone-Acetaminophen] Diarrhea and Nausea And Vomiting   Demerol [Meperidine] Nausea And Vomiting   Flonase [Fluticasone]     anaphylaxis   Lodine [Etodolac] Rash   Review of Systems  Respiratory:  Positive for shortness of breath.   Cardiovascular:        Dyspnea on exertion  Neurological:  Positive for dizziness.     Objective:     BP 127/67   Pulse 76   Ht _0  (1.6 m)   Wt 217 lb 6.4 oz (98.6 kg)   SpO2 97%   BMI 38.51 kg/m  BP Readings from Last 3 Encounters:  09/05/22 127/67  07/23/22 124/78  07/11/22 130/82   Physical Exam Vitals reviewed.  Constitutional:      Appearance: Normal appearance.   Cardiovascular:     Rate and Rhythm: Normal rate. Rhythm irregular.     Heart sounds: No murmur heard.    No friction rub. No gallop.     Comments: Irregularly irregular.  No JVD appreciated on exam Pulmonary:     Effort: Pulmonary effort is normal.     Breath sounds: Normal breath sounds. No wheezing, rhonchi or rales.  Abdominal:  General: Abdomen is flat. Bowel sounds are normal. There is no distension.     Palpations: Abdomen is soft.     Tenderness: There is no abdominal tenderness.  Musculoskeletal:     Cervical back: Normal range of motion.     Right lower leg: No edema.     Left lower leg: No edema.  Skin:    General: Skin is warm and dry.     Coloration: Skin is not jaundiced.  Neurological:     General: No focal deficit present.     Mental Status: She is alert and oriented to person, place, and time.  Psychiatric:        Mood and Affect: Mood normal.        Behavior: Behavior normal.    Last CBC Lab Results  Component Value Date   WBC 8.4 07/01/2022   HGB 8.2 (L) 07/01/2022   HCT 27.7 (L) 07/01/2022   MCV 77 (L) 07/01/2022   MCH 22.7 (L) 07/01/2022   RDW 19.5 (H) 07/01/2022   PLT 428 18/84/1660   Last metabolic panel Lab Results  Component Value Date   GLUCOSE 102 (H) 07/17/2022   NA 137 07/17/2022   K 4.3 07/17/2022   CL 101 07/17/2022   CO2 22 07/17/2022   BUN 17 07/17/2022   CREATININE 1.00 07/17/2022   EGFR 61 07/17/2022   CALCIUM 9.1 07/17/2022   PHOS 3.1 06/19/2022   PROT 7.4 07/17/2022   ALBUMIN 3.7 (L) 07/17/2022   LABGLOB 3.7 07/17/2022   AGRATIO 1.0 (L) 07/17/2022   BILITOT 0.4 07/17/2022   ALKPHOS 104 07/17/2022   AST 15 07/17/2022   ALT 11 07/17/2022   ANIONGAP 7 06/19/2022   Last lipids Lab Results  Component Value Date   CHOL 123 03/06/2022   HDL 48 03/06/2022   LDLCALC 63 03/06/2022   TRIG 51 03/06/2022   CHOLHDL 2.6 03/06/2022   Last hemoglobin A1c Lab Results  Component Value Date   HGBA1C 5.5 01/03/2020   Last  thyroid functions Lab Results  Component Value Date   TSH 2.120 07/17/2022   Last vitamin D Lab Results  Component Value Date   VD25OH 22.7 (L) 03/06/2022   Last vitamin B12 and Folate Lab Results  Component Value Date   VITAMINB12 403 08/23/2020     Assessment & Plan:   Problem List Items Addressed This Visit       Atrial fibrillation, chronic ([place])    Presenting today for routine follow-up but endorses symptoms of dyspnea on exertion and dizziness.  She appears euvolemic on exam.  Irregularly irregular rate and rhythm detected on physical exam today.  ECG obtained and demonstrates atrial fibrillation.  She has been compliant with Eliquis 5 mg twice daily, Toprol tartrate 12 5 mg twice daily, and Tikosyn 125 mcg twice daily.  Given her history, she likely needs cardioversion for symptomatic relief.  I have spoken with her cardiologist, Dr. Domenic Polite, who recommends that [name] present to the emergency department for DCCV.  I have also spoke with Dr. Sabra Heck at [facility], ED, who is aware.  I discussed with [name] the recommendation for her to present to the emergency department and that her cardiologist in agreement with this recommendation.  She expressed understanding.  She states that she will go to pick up her daughter and then plans to present to the emergency department for evaluation and treatment. -Follow-up in 2 weeks      Return in about 2 weeks (around  09/19/2022).    Johnette Abraham, MD

## 2022-09-05 NOTE — Assessment & Plan Note (Signed)
Presenting today for routine follow-up but endorses symptoms of dyspnea on exertion and dizziness.  She appears euvolemic on exam.  Irregularly irregular rate and rhythm detected on physical exam today.  ECG obtained and demonstrates atrial fibrillation.  She has been compliant with Eliquis 5 mg twice daily, Toprol tartrate 12 5 mg twice daily, and Tikosyn 125 mcg twice daily.  Given her history, she likely needs cardioversion for symptomatic relief.  I have spoken with her cardiologist, Dr. Domenic Polite, who recommends that Ms. Route present to the emergency department for DCCV.  I have also spoke with Dr. Sabra Heck at [facility], ED, who is aware.  I discussed with Ms. Mackert the recommendation for her to present to the emergency department and that her cardiologist in agreement with this recommendation.  She expressed understanding.  She states that she will go to pick up her daughter and then plans to present to the emergency department for evaluation and treatment. -Follow-up in 2 weeks

## 2022-09-06 ENCOUNTER — Other Ambulatory Visit (HOSPITAL_COMMUNITY): Payer: Self-pay | Admitting: Physician Assistant

## 2022-09-06 NOTE — Telephone Encounter (Signed)
Patient notified. Monitor will be mailed to pt's home and pt will come in for monitor placement.

## 2022-09-17 ENCOUNTER — Ambulatory Visit: Payer: Medicare Other

## 2022-09-17 DIAGNOSIS — I482 Chronic atrial fibrillation, unspecified: Secondary | ICD-10-CM

## 2022-09-18 ENCOUNTER — Ambulatory Visit: Payer: Medicare Other

## 2022-09-18 NOTE — Addendum Note (Signed)
Addended by: Levonne Hubert on: 09/18/2022 01:34 PM   Modules accepted: Orders

## 2022-09-19 ENCOUNTER — Ambulatory Visit (INDEPENDENT_AMBULATORY_CARE_PROVIDER_SITE_OTHER): Payer: Medicare Other | Admitting: Internal Medicine

## 2022-09-19 ENCOUNTER — Encounter: Payer: Self-pay | Admitting: Internal Medicine

## 2022-09-19 VITALS — BP 107/62 | HR 61 | Ht 63.0 in | Wt 218.2 lb

## 2022-09-19 DIAGNOSIS — N6310 Unspecified lump in the right breast, unspecified quadrant: Secondary | ICD-10-CM

## 2022-09-19 DIAGNOSIS — M545 Low back pain, unspecified: Secondary | ICD-10-CM

## 2022-09-19 DIAGNOSIS — I482 Chronic atrial fibrillation, unspecified: Secondary | ICD-10-CM

## 2022-09-19 DIAGNOSIS — Z23 Encounter for immunization: Secondary | ICD-10-CM

## 2022-09-19 DIAGNOSIS — Z0001 Encounter for general adult medical examination with abnormal findings: Secondary | ICD-10-CM

## 2022-09-19 DIAGNOSIS — Z0000 Encounter for general adult medical examination without abnormal findings: Secondary | ICD-10-CM

## 2022-09-19 DIAGNOSIS — Z1231 Encounter for screening mammogram for malignant neoplasm of breast: Secondary | ICD-10-CM | POA: Diagnosis not present

## 2022-09-19 DIAGNOSIS — G8929 Other chronic pain: Secondary | ICD-10-CM

## 2022-09-19 MED ORDER — CYCLOBENZAPRINE HCL 5 MG PO TABS: 5.0000 mg | ORAL_TABLET | Freq: Three times a day (TID) | ORAL | 1 refills | Status: DC | PRN

## 2022-09-19 NOTE — Progress Notes (Unsigned)
Established Patient Office Visit  Subjective   Patient ID: Maria Fry, female    DOB: 07-Nov-1951  Age: 71 y.o. MRN: [id number]  Chief Complaint  Patient presents with   Follow-up   Maria Fry returns to care today.  She is a 71 year old woman with a past medical history significant for atrial fibrillation, CAD, HFrEF with recovered EF, HTN, CAD, OSA, GERD, acquired hypothyroidism, HLD, and vitamin D deficiency.  She was last seen by me on 10/5 in routine follow-up at which time she endorsed feeling poorly secondary to being back in A-fib.  This was confirmed on ECG.  She was hemodynamically stable.  When she was previously in A-fib she required DCCV.  I discussed her current symptoms and ECG results with her cardiologist (Dr. Domenic Polite) who is in agreement with recommending that she present to the emergency department for cardioversion.  However, upon presentation to the emergency department emergent cardioversion was recommended against due to hemodynamic stability.  Outpatient monitoring and follow-up with her cardiologist as recommended.  She is currently scheduled to see Dr. Domenic Polite in follow-up on 11/9.  Today Maria Fry continues to endorse feeling poorly.  She is wearing an event monitor, which was placed on Tuesday (10/17).  Her symptoms are largely unchanged.  Her additional concern today is chronic low back pain.  She denies red flag symptoms currently.  She has been managing her pain with Tylenol and gabapentin without significant improvement.  She is interested in additional treatment options today.  Acute concerns, chronic medical conditions, and outstanding preventative healthcare maintenance items discussed today are individually addressed in A/P below.  Past Medical History:  Diagnosis Date   Arthritis    Right knee   Atrial fibrillation (HCC)    CAD (coronary artery disease)    a. 06/07/15 NSTEMI/Cath: Dist LAD 100%, mid LAD 10%. Otw nl cors. Nl EF w/ inferoapical AK.   Essential  hypertension    Hip pain    History of cardiomyopathy 04/23/2016   a. 04/2016 Echo: EF 45%, Gr2 DD; b. 11/2019 Echo: EF 45-50%, mod LVH. Gr2 DD. Nl RV fxn. Sev dil LA.    Hyperlipidemia    Hypothyroidism    NSTEMI (non-ST elevated myocardial infarction) ([place]) 06/05/2015   Vitamin D deficiency    Past Surgical History:  Procedure Laterality Date   ABDOMINAL HYSTERECTOMY     partial- one ovary remaining   BIOPSY  09/17/2020   Procedure: BIOPSY;  Surgeon: [name], DO;  [facility];  Service: Endoscopy;;   CARDIAC CATHETERIZATION N/A 06/06/2015   Procedure: Left Heart Cath and Coronary Angiography;  Surgeon: Peter M Martinique, MD;  Location: [place] [facility] LAB;  Service: Cardiovascular;  Laterality: N/A;   CARDIOVERSION N/A 09/14/2020   Procedure: CARDIOVERSION;  Surgeon: Satira Sark, MD;  [facility];  Service: Cardiovascular;  Laterality: N/A;   CARDIOVERSION N/A 06/20/2022   Procedure: CARDIOVERSION;  Surgeon: Arnoldo Lenis, MD;  [facility];  Service: Endoscopy;  Laterality: N/A;   CHOLECYSTECTOMY     COLONOSCOPY     COLONOSCOPY N/A 08/13/2017   Procedure: COLONOSCOPY;  Surgeon: Daneil Dolin, MD;  [facility];  Service: Endoscopy;  Laterality: N/A;  8:30 AM   COLONOSCOPY WITH PROPOFOL N/A 01/18/2021   [name]: Multiple tubular adenomas removed, next colonoscopy in 3 years   ESOPHAGOGASTRODUODENOSCOPY N/A 09/11/2016   Procedure: ESOPHAGOGASTRODUODENOSCOPY (EGD);  Surgeon: Daneil Dolin, MD;  [facility];  Service: Endoscopy;  Laterality: N/A;  7:45 am - moved to 10/11 @ 10:30   ESOPHAGOGASTRODUODENOSCOPY (EGD) WITH PROPOFOL N/A 09/17/2020   [name]: Diffuse moderate inflammation characterized by erosions and erythema found in the entire stomach.  Biopsies positive for H. pylori.  Patient has not been treated due to drug allergies.   Heel spurs Left    MALONEY DILATION N/A 09/11/2016   Procedure: Venia Minks DILATION;   Surgeon: Daneil Dolin, MD;  [facility];  Service: Endoscopy;  Laterality: N/A;   POLYPECTOMY  08/13/2017   Procedure: POLYPECTOMY;  Surgeon: Daneil Dolin, MD;  [facility];  Service: Endoscopy;;  colon   POLYPECTOMY  01/18/2021   Procedure: POLYPECTOMY;  Surgeon: Daneil Dolin, MD;  [facility];  Service: Endoscopy;;   SHOULDER ARTHROSCOPY Right    TUBAL LIGATION     Social History   Tobacco Use   Smoking status: Former    Packs/day: 1.00    Years: 20.00    Total pack years: 20.00    Types: Cigarettes    Start date: 12/02/1977    Quit date: 12/02/2002    Years since quitting: 19.8   Smokeless tobacco: Never   Tobacco comments:    Former smoker 05/10/22  Vaping Use   Vaping Use: Never used  Substance Use Topics   Alcohol use: No    Alcohol/week: 0.0 standard drinks of alcohol   Drug use: No   Family History  Problem Relation Age of Onset   Heart failure Father        Deceased   Heart attack Father        Deceased   Stroke Sister        Deceased   Heart failure Brother        Deceased   Cancer Brother        Deceased   Heart attack Brother        Deceased   COPD Daughter    Arthritis Daughter    Lupus Daughter    Colon cancer Neg Hx    Allergies  Allergen Reactions   Afrin [Oxymetazoline] Anaphylaxis   Amoxicillin Anaphylaxis   Lisinopril Anaphylaxis   Ranolazine Anaphylaxis    Throat swells, Dizzy, Reaction occurred with generic med   Vicodin [Hydrocodone-Acetaminophen] Diarrhea and Nausea And Vomiting   Demerol [Meperidine] Nausea And Vomiting   Flonase [Fluticasone]     anaphylaxis   Lodine [Etodolac] Rash   Review of Systems  Constitutional:  Positive for malaise/fatigue.  Cardiovascular:        Dyspnea on exertion  Musculoskeletal:  Positive for back pain (Chronic lumbar back pain).  Neurological:  Positive for dizziness.  All other systems reviewed and are negative.    Objective:     BP 107/62   Pulse  61   Ht '5\' 3"'$  (1.6 m)   Wt 218 lb 3.2 oz (99 kg)   SpO2 95%   BMI 38.65 kg/m  BP Readings from Last 3 Encounters:  09/19/22 107/62  09/05/22 121/75  09/05/22 127/67   Physical Exam Vitals reviewed.  Constitutional:      Appearance: Normal appearance.  Cardiovascular:     Rate and Rhythm: Normal rate. Rhythm irregular.     Heart sounds: No murmur heard.    No friction rub. No gallop.     Comments: Irregularly irregular.  No JVD appreciated on exam Pulmonary:     Effort: Pulmonary effort is normal.     Breath sounds: Normal breath  sounds. No wheezing, rhonchi or rales.  Abdominal:     General: Abdomen is flat. Bowel sounds are normal. There is no distension.     Palpations: Abdomen is soft.     Tenderness: There is no abdominal tenderness.  Musculoskeletal:        General: Tenderness (Lumbar spine, paraspinal muscles bilaterally) present.     Cervical back: Normal range of motion.     Right lower leg: No edema.     Left lower leg: No edema.  Skin:    General: Skin is warm and dry.     Coloration: Skin is not jaundiced.  Neurological:     General: No focal deficit present.     Mental Status: She is alert and oriented to person, place, and time.  Psychiatric:        Mood and Affect: Mood normal.        Behavior: Behavior normal.    Last CBC Lab Results  Component Value Date   WBC 7.9 09/05/2022   HGB 9.4 (L) 09/05/2022   HCT 30.6 (L) 09/05/2022   MCV 79.9 (L) 09/05/2022   MCH 24.5 (L) 09/05/2022   RDW 23.8 (H) 09/05/2022   PLT 378 21/19/4174   Last metabolic panel Lab Results  Component Value Date   GLUCOSE 96 09/05/2022   NA 136 09/05/2022   K 4.0 09/05/2022   CL 104 09/05/2022   CO2 25 09/05/2022   BUN 18 09/05/2022   CREATININE 1.01 (H) 09/05/2022   GFRNONAA >60 09/05/2022   CALCIUM 8.7 (L) 09/05/2022   PHOS 3.1 06/19/2022   PROT 7.4 07/17/2022   ALBUMIN 3.7 (L) 07/17/2022   LABGLOB 3.7 07/17/2022   AGRATIO 1.0 (L) 07/17/2022   BILITOT 0.4  07/17/2022   ALKPHOS 104 07/17/2022   AST 15 07/17/2022   ALT 11 07/17/2022   ANIONGAP 7 09/05/2022   Last lipids Lab Results  Component Value Date   CHOL 123 03/06/2022   HDL 48 03/06/2022   LDLCALC 63 03/06/2022   TRIG 51 03/06/2022   CHOLHDL 2.6 03/06/2022   Last hemoglobin A1c Lab Results  Component Value Date   HGBA1C 5.5 01/03/2020   Last thyroid functions Lab Results  Component Value Date   TSH 2.120 07/17/2022   Last vitamin D Lab Results  Component Value Date   VD25OH 22.7 (L) 03/06/2022   Last vitamin B12 and Folate Lab Results  Component Value Date   VITAMINB12 403 08/23/2020     Assessment & Plan:   Problem List Items Addressed This Visit       Atrial fibrillation, chronic ([place])    History of persistent atrial fibrillation, previously requiring DCCV.  Events from her last appointment (10/5) are noted above.  Her symptoms are unchanged today.  She remains in A-fib.  Currently wearing event monitor.  She has follow-up with cardiology scheduled for 11/9.      Lumbar back pain    Chronic lumbar back pain.  Severity has worsened recently.  No red flag symptoms present.  She has tenderness along the paraspinal muscles bilaterally.  She is currently managing her pain with Tylenol and gabapentin. -Flexeril 5 mg nightly as needed prescribed today -I have also placed a referral to physical therapy for further treatment.      Preventative health care    Returning today for follow-up -Mammogram ordered today -Influenza vaccine administered today      Return in about 8 weeks (around 11/14/2022).    Johnette Abraham, MD

## 2022-09-19 NOTE — Patient Instructions (Signed)
It was a pleasure to see you today.  Thank you for giving [place] the opportunity to be involved in your care.  Below is a brief recap of your visit and next steps.  We will plan to see you again in 8 weeks.  Summary I have placed a referral to physical therapy today for back pain and have also prescribed flexeril. As discussed, I recommend that you take this in the evening as needed and not before driving because it may make you tired. I have ordered a mammogram today You will receive your flu shot today  Next steps Follow up in 8 weeks.

## 2022-09-20 ENCOUNTER — Telehealth: Payer: Self-pay | Admitting: Internal Medicine

## 2022-09-20 NOTE — Telephone Encounter (Signed)
Per radiology it was recommended in April for pt to have a dx mammogram & [place] of rt breast. Can you please change this order?

## 2022-09-24 ENCOUNTER — Other Ambulatory Visit: Payer: Self-pay

## 2022-09-24 ENCOUNTER — Telehealth: Payer: Self-pay | Admitting: Internal Medicine

## 2022-09-24 ENCOUNTER — Ambulatory Visit: Payer: Medicare Other | Attending: Cardiology

## 2022-09-24 ENCOUNTER — Other Ambulatory Visit: Payer: Self-pay | Admitting: Internal Medicine

## 2022-09-24 DIAGNOSIS — N6310 Unspecified lump in the right breast, unspecified quadrant: Secondary | ICD-10-CM

## 2022-09-24 NOTE — Telephone Encounter (Signed)
Scheduled for 11/14 at 1120

## 2022-09-24 NOTE — Telephone Encounter (Signed)
Per [facility] radiology pt is needing a follow up mammogram order with [place] of rt breast mass ordered before they can schedule mammogram

## 2022-09-25 ENCOUNTER — Encounter: Payer: Self-pay | Admitting: Internal Medicine

## 2022-09-25 DIAGNOSIS — Z0001 Encounter for general adult medical examination with abnormal findings: Secondary | ICD-10-CM | POA: Insufficient documentation

## 2022-09-25 DIAGNOSIS — M545 Low back pain, unspecified: Secondary | ICD-10-CM | POA: Insufficient documentation

## 2022-09-25 DIAGNOSIS — Z0000 Encounter for general adult medical examination without abnormal findings: Secondary | ICD-10-CM | POA: Insufficient documentation

## 2022-09-25 NOTE — Assessment & Plan Note (Signed)
Returning today for follow-up -Mammogram ordered today -Influenza vaccine administered today

## 2022-09-25 NOTE — Telephone Encounter (Signed)
LVM

## 2022-09-25 NOTE — Assessment & Plan Note (Signed)
Chronic lumbar back pain.  Severity has worsened recently.  No red flag symptoms present.  She has tenderness along the paraspinal muscles bilaterally.  She is currently managing her pain with Tylenol and gabapentin. -Flexeril 5 mg nightly as needed prescribed today -I have also placed a referral to physical therapy for further treatment.

## 2022-09-25 NOTE — Assessment & Plan Note (Signed)
History of persistent atrial fibrillation, previously requiring DCCV.  Events from her last appointment (10/5) are noted above.  Her symptoms are unchanged today.  She remains in A-fib.  Currently wearing event monitor.  She has follow-up with cardiology scheduled for 11/9.

## 2022-10-10 ENCOUNTER — Encounter: Payer: Self-pay | Admitting: Cardiology

## 2022-10-10 ENCOUNTER — Ambulatory Visit: Payer: Medicare Other | Attending: Cardiology | Admitting: Cardiology

## 2022-10-10 VITALS — BP 130/66 | HR 70 | Ht 63.0 in | Wt 214.6 lb

## 2022-10-10 DIAGNOSIS — I5032 Chronic diastolic (congestive) heart failure: Secondary | ICD-10-CM | POA: Diagnosis not present

## 2022-10-10 DIAGNOSIS — I4819 Other persistent atrial fibrillation: Secondary | ICD-10-CM

## 2022-10-10 DIAGNOSIS — I25119 Atherosclerotic heart disease of native coronary artery with unspecified angina pectoris: Secondary | ICD-10-CM | POA: Diagnosis not present

## 2022-10-10 NOTE — Progress Notes (Signed)
Cardiology Office Note  Date: 10/10/2022   ID: Maria Fry, DOB May 12, 1951, MRN [id number]  PCP:  Johnette Abraham, MD  Cardiologist:  Rozann Lesches, MD Electrophysiologist:  Thompson Grayer, MD   Chief Complaint  Patient presents with   Cardiac follow-up    History of Present Illness: Maria Fry is a 71 y.o. female last seen in August by Ms. Vita Barley, I reviewed the note.  She is here for a follow-up visit.  She has had recurrent atrial fibrillation in the interim, noted when she was seen by her PCP in the office in October.  Cardiac monitor was arranged by Dr. Harrington Challenger and she presents today to discuss the results.  I reviewed her recent cardiac monitor largely showing sinus rhythm however with a few episodes of possible atrial fibrillation with slow ventricular response in the setting of lead motion artifact.  Her heart rate is regular today and she states that she feels back to baseline.  It does sound like she is having intermittent episodes of atrial fibrillation however, she feels a fluttering sensation and also fatigue when this happens.  She is scheduled to follow-up in the atrial fibrillation clinic in December.  CHA2DS2-VASc score is 5.  She is on Eliquis for stroke prophylaxis, also very low-dose Lopressor in the setting of bradycardia in sinus rhythm, also Tikosyn.  Echocardiogram from July showed normal LVEF at 55%.  Past Medical History:  Diagnosis Date   Arthritis    Right knee   Atrial fibrillation (HCC)    CAD (coronary artery disease)    a. 06/07/15 NSTEMI/Cath: Dist LAD 100%, mid LAD 10%. Otw nl cors. Nl EF w/ inferoapical AK.   Essential hypertension    Hip pain    History of cardiomyopathy 04/23/2016   a. 04/2016 Echo: EF 45%, Gr2 DD; b. 11/2019 Echo: EF 45-50%, mod LVH. Gr2 DD. Nl RV fxn. Sev dil LA.    Hyperlipidemia    Hypothyroidism    NSTEMI (non-ST elevated myocardial infarction) ([place]) 06/05/2015   Vitamin D deficiency     Past Surgical History:   Procedure Laterality Date   ABDOMINAL HYSTERECTOMY     partial- one ovary remaining   BIOPSY  09/17/2020   Procedure: BIOPSY;  Surgeon: [name], DO;  [facility];  Service: Endoscopy;;   CARDIAC CATHETERIZATION N/A 06/06/2015   Procedure: Left Heart Cath and Coronary Angiography;  Surgeon: Peter M Martinique, MD;  Location: [facility];  Service: Cardiovascular;  Laterality: N/A;   CARDIOVERSION N/A 09/14/2020   Procedure: CARDIOVERSION;  Surgeon: Satira Sark, MD;  [facility];  Service: Cardiovascular;  Laterality: N/A;   CARDIOVERSION N/A 06/20/2022   Procedure: CARDIOVERSION;  Surgeon: Arnoldo Lenis, MD;  Location: [facility];  Service: Endoscopy;  Laterality: N/A;   CHOLECYSTECTOMY     COLONOSCOPY     COLONOSCOPY N/A 08/13/2017   Procedure: COLONOSCOPY;  Surgeon: Daneil Dolin, MD;  [facility];  Service: Endoscopy;  Laterality: N/A;  8:30 AM   COLONOSCOPY WITH PROPOFOL N/A 01/18/2021   [name]: Multiple tubular adenomas removed, next colonoscopy in 3 years   ESOPHAGOGASTRODUODENOSCOPY N/A 09/11/2016   Procedure: ESOPHAGOGASTRODUODENOSCOPY (EGD);  Surgeon: Daneil Dolin, MD;  [facility];  Service: Endoscopy;  Laterality: N/A;  7:45 am - moved to 10/11 @ 10:30   ESOPHAGOGASTRODUODENOSCOPY (EGD) WITH PROPOFOL N/A 09/17/2020   [name]: Diffuse moderate inflammation characterized by erosions and erythema found in the entire  stomach.  Biopsies positive for H. pylori.  Patient has not been treated due to drug allergies.   Heel spurs Left    MALONEY DILATION N/A 09/11/2016   Procedure: Venia Minks DILATION;  Surgeon: Daneil Dolin, MD;  [facility];  Service: Endoscopy;  Laterality: N/A;   POLYPECTOMY  08/13/2017   Procedure: POLYPECTOMY;  Surgeon: Daneil Dolin, MD;  [facility];  Service: Endoscopy;;  colon   POLYPECTOMY  01/18/2021   Procedure: POLYPECTOMY;  Surgeon: Daneil Dolin, MD;  [facility];  Service: Endoscopy;;   SHOULDER ARTHROSCOPY Right    TUBAL LIGATION      Current Outpatient Medications  Medication Sig Dispense Refill   acetaminophen (TYLENOL) 500 MG tablet Take 500 mg by mouth every 6 (six) hours as needed for mild pain or moderate pain.     amLODipine (NORVASC) 2.5 MG tablet Take 1 tablet (2.5 mg total) by mouth daily. 90 tablet 3   atorvastatin (LIPITOR) 80 MG tablet Take 1 tablet (80 mg total) by mouth at bedtime. 90 tablet 3   calcium carbonate (OS-CAL - DOSED IN MG OF ELEMENTAL CALCIUM) 1250 (500 Ca) MG tablet Take 500 mg by mouth daily.     Cholecalciferol (VITAMIN D3) 50 MCG (2000 UT) capsule Take 1 capsule (2,000 Units total) by mouth daily. 30 capsule 3   cyclobenzaprine (FLEXERIL) 5 MG tablet Take 1 tablet (5 mg total) by mouth 3 (three) times daily as needed for muscle spasms. 30 tablet 1   diclofenac Sodium (VOLTAREN) 1 % GEL Apply 1 application topically 4 (four) times daily as needed (pain).     dofetilide (TIKOSYN) 125 MCG capsule Take 1 capsule by mouth twice daily 180 capsule 1   ELIQUIS 5 MG TABS tablet Take 1 tablet by mouth twice daily 60 tablet 3   ferrous sulfate 325 (65 FE) MG EC tablet Take 1 tablet (325 mg total) by mouth daily. 60 tablet 1   furosemide (LASIX) 40 MG tablet Take 1 tablet (40 mg total) by mouth daily.     gabapentin (NEURONTIN) 300 MG capsule Take 1 capsule (300 mg total) by mouth 2 (two) times daily. 60 capsule 2   levothyroxine (SYNTHROID) 100 MCG tablet Take 1 tablet (100 mcg total) by mouth daily before breakfast. 90 tablet 1   loratadine (CLARITIN) 10 MG tablet Take 10 mg by mouth daily.     magnesium oxide (MAG-OX) 400 MG tablet Take 0.5 tablets (200 mg total) by mouth daily. 30 tablet 6   melatonin 3 MG TABS tablet Take 3 mg by mouth at bedtime.     metoprolol tartrate (LOPRESSOR) 25 MG tablet Take 0.5 tablets (12.5 mg total) by mouth 2 (two) times daily. 45 tablet 3   nitroGLYCERIN (NITROSTAT) 0.4 MG SL tablet  DISSOLVE ONE TABLET UNDER THE TONGUE EVERY 5 MINUTES AS NEEDED FOR CHEST PAIN.  DO NOT EXCEED A TOTAL OF 3 DOSES IN 15 MINUTES (Patient taking differently: DISSOLVE ONE TABLET UNDER THE TONGUE EVERY 5 MINUTES AS NEEDED FOR CHEST PAIN.  DO NOT EXCEED A TOTAL OF 3 DOSES IN 15 MINUTES) 25 tablet 3   ondansetron (ZOFRAN-ODT) 4 MG disintegrating tablet Take 1 tablet (4 mg total) by mouth every 8 (eight) hours as needed for nausea or vomiting. 20 tablet 0   potassium chloride SA (KLOR-CON M) 20 MEQ tablet Take 2 tablets (40 mEq total) by mouth daily.     spironolactone (ALDACTONE) 25 MG tablet Take 1/2 (  one-half) tablet by mouth once daily 45 tablet 2   vitamin C (ASCORBIC ACID) 500 MG tablet Take 500 mg by mouth daily.     No current facility-administered medications for this visit.   Allergies:  Afrin [oxymetazoline], Amoxicillin, Lisinopril, Ranolazine, Vicodin [hydrocodone-acetaminophen], Demerol [meperidine], Flonase [fluticasone], and Lodine [etodolac]   ROS: No orthopnea or PND.  Physical Exam: VS:  BP 130/66   Pulse 70   Ht '5\' 3"'$  (1.6 m)   Wt 214 lb 9.6 oz (97.3 kg)   SpO2 96%   BMI 38.01 kg/m , BMI Body mass index is 38.01 kg/m.  Wt Readings from Last 3 Encounters:  10/10/22 214 lb 9.6 oz (97.3 kg)  09/19/22 218 lb 3.2 oz (99 kg)  09/05/22 217 lb (98.4 kg)    General: Patient appears comfortable at rest. HEENT: Conjunctiva and lids normal. Neck: Supple, no elevated JVP or carotid bruits. Lungs: Clear to auscultation, nonlabored breathing at rest. Cardiac: Regular rate and rhythm, no S3, 1/6 systolic murmur. Abdomen: Soft, nontender, bowel sounds present. Extremities: No pitting edema.  ECG:  An ECG dated 09/05/2022 was personally reviewed today and demonstrated:  Atrial fibrillation with nonspecific ST-T changes.  Recent Labwork: 06/18/2022: B Natriuretic Peptide 305.0 06/19/2022: Magnesium 2.4 07/17/2022: ALT 11; AST 15; TSH 2.120 09/05/2022: BUN 18; Creatinine, Ser 1.01;  Hemoglobin 9.4; Platelets 378; Potassium 4.0; Sodium 136     Component Value Date/Time   CHOL 123 03/06/2022 1044   TRIG 51 03/06/2022 1044   HDL 48 03/06/2022 1044   CHOLHDL 2.6 03/06/2022 1044   CHOLHDL 2.7 01/03/2020 0948   VLDL 12 06/23/2017 0902   LDLCALC 63 03/06/2022 1044   LDLCALC 63 01/03/2020 0948    Other Studies Reviewed Today:  Echocardiogram 06/19/2022:  1. Left ventricular ejection fraction, by estimation, is 55%. The left  ventricle has normal function. Left ventricular endocardial border not  optimally defined to evaluate regional wall motion. There is severe left  ventricular hypertrophy.   2. Right ventricular systolic function is normal. The right ventricular  size is normal.   3. The aortic valve is tricuspid. There is mild calcification of the  aortic valve. There is mild thickening of the aortic valve.   4. IVC is small suggesting low RA pressure and hypovolemia.   5. Limited echo to evaluate LV function   Cardiac monitor October 2023: [employer] monitor reviewed.  21 days analyzed.   Predominant rhythm is sinus with heart rate ranging from 43 bpm up to 105 bpm and average heart rate 60 bpm. There were occasional PACs representing 2% total beats. There were occasional to frequent PVCs representing 6% total beats. Possible episode of atrial fibrillation with slow ventricular response noted on October 31 and November 3, cannot exclude lead motion artifact however.  Assessment and Plan:  1.  Paroxysmal atrial fibrillation with CHA2DS2-VASc score of 5.  She has had recent breakthrough arrhythmia that has been symptomatic, although follow-up heart monitor did not show particularly large burden.  She is already on Tikosyn and low-dose Lopressor with sinus bradycardia at baseline, Eliquis for stroke prophylaxis.  Would keep follow-up in the atrial fibrillation clinic in December.  May need to be considered for possible atrial fibrillation ablation if symptoms  increase.  2.  CAD with known occlusion of the distal LAD being managed medically.  No angina symptoms reported.  LVEF normal at 55%.  Continue Lipitor and Norvasc.  3.  HFpEF with LVEF 55%.  Weight is down 4 pounds and she  continues on Lasix as well as Aldactone.  Medication Adjustments/Labs and Tests Ordered: Current medicines are reviewed at length with the patient today.  Concerns regarding medicines are outlined above.   Tests Ordered: No orders of the defined types were placed in this encounter.   Medication Changes: No orders of the defined types were placed in this encounter.   Disposition:  Follow up  6 months.  Signed, Satira Sark, MD, [facility] 10/10/2022 3:57 PM    [facility] [address], [place], [place] [address] Phone: [phone]; Fax: [phone]

## 2022-10-10 NOTE — Patient Instructions (Signed)
Medication Instructions:  Your physician recommends that you continue on your current medications as directed. Please refer to the Current Medication list given to you today.   Labwork: None today  Testing/Procedures: None today  Follow-Up: 6 months Dr.McDowell  Any Other Special Instructions Will Be Listed Below (If Applicable).  If you need a refill on your cardiac medications before your next appointment, please call your pharmacy.  

## 2022-10-14 ENCOUNTER — Ambulatory Visit
Admission: EM | Admit: 2022-10-14 | Discharge: 2022-10-14 | Disposition: A | Discharge status: Home / Self Care | Payer: Medicare Other | Attending: Family Medicine | Admitting: Family Medicine

## 2022-10-14 DIAGNOSIS — R059 Cough, unspecified: Secondary | ICD-10-CM | POA: Diagnosis not present

## 2022-10-14 DIAGNOSIS — J069 Acute upper respiratory infection, unspecified: Secondary | ICD-10-CM | POA: Insufficient documentation

## 2022-10-14 DIAGNOSIS — Z79899 Other long term (current) drug therapy: Secondary | ICD-10-CM | POA: Diagnosis not present

## 2022-10-14 DIAGNOSIS — Z1152 Encounter for screening for COVID-19: Secondary | ICD-10-CM | POA: Insufficient documentation

## 2022-10-14 LAB — RESP PANEL BY RT-PCR (FLU A&B, COVID) ARPGX2
Influenza A by PCR: NEGATIVE
Influenza B by PCR: NEGATIVE
SARS Coronavirus 2 by RT PCR: NEGATIVE

## 2022-10-14 MED ORDER — PROMETHAZINE-DM 6.25-15 MG/5ML PO SYRP: 5.0000 mL | ORAL_SOLUTION | Freq: Four times a day (QID) | ORAL | 0 refills | Status: DC | PRN

## 2022-10-14 MED ORDER — AZELASTINE HCL 0.1 % NA SOLN: 1.0000 | Freq: Two times a day (BID) | NASAL | 0 refills | Status: DC

## 2022-10-14 NOTE — ED Notes (Signed)
Reviewed discharge instructions with patient. Pt reported allergy to some nasal sprays. Reviewed allergies and prescription not listed on allergy list but discussed with pt to consult pharmacist if had questions related to prescribed nasal spray.

## 2022-10-14 NOTE — ED Triage Notes (Signed)
Pt reports cough, nasal congestion and sneezing x 4 days. OTC meds gives no relief.

## 2022-10-15 ENCOUNTER — Ambulatory Visit (HOSPITAL_COMMUNITY): Payer: Medicare Other

## 2022-10-15 ENCOUNTER — Encounter (HOSPITAL_COMMUNITY): Payer: Medicare Other

## 2022-10-15 NOTE — Therapy (Signed)
OUTPATIENT PHYSICAL THERAPY THORACOLUMBAR EVALUATION   Patient Name: Maria Fry MRN: [id number] DOB:01/09/1951, 71 y.o., female Today's Date: 10/17/2022   PT End of Session - 10/17/22 1033     Visit Number 1    Number of Visits 8    Date for PT Re-Evaluation 11/21/22    Authorization Type [employer] Medicare    Authorization Time Period no VL, 20% co insurance, no auth, no copay    Progress Note Due on Visit 10    PT Start Time 1035    PT Stop Time 1115    PT Time Calculation (min) 40 min    Behavior During Therapy [facility] for tasks assessed/performed             Past Medical History:  Diagnosis Date   Arthritis    Right knee   Atrial fibrillation ([place])    CAD (coronary artery disease)    a. 06/07/15 NSTEMI/Cath: Dist LAD 100%, mid LAD 10%. Otw nl cors. Nl EF w/ inferoapical AK.   Essential hypertension    Hip pain    History of cardiomyopathy 04/23/2016   a. 04/2016 Echo: EF 45%, Gr2 DD; b. 11/2019 Echo: EF 45-50%, mod LVH. Gr2 DD. Nl RV fxn. Sev dil LA.    Hyperlipidemia    Hypothyroidism    NSTEMI (non-ST elevated myocardial infarction) ([place]) 06/05/2015   Vitamin D deficiency    Past Surgical History:  Procedure Laterality Date   ABDOMINAL HYSTERECTOMY     partial- one ovary remaining   BIOPSY  09/17/2020   Procedure: BIOPSY;  Surgeon: [name], DO;  [facility];  Service: Endoscopy;;   CARDIAC CATHETERIZATION N/A 06/06/2015   Procedure: Left Heart Cath and Coronary Angiography;  Surgeon: Peter M Martinique, MD;  Location: [facility];  Service: Cardiovascular;  Laterality: N/A;   CARDIOVERSION N/A 09/14/2020   Procedure: CARDIOVERSION;  Surgeon: Satira Sark, MD;  [facility];  Service: Cardiovascular;  Laterality: N/A;   CARDIOVERSION N/A 06/20/2022   Procedure: CARDIOVERSION;  Surgeon: Arnoldo Lenis, MD;  Location: [facility];  Service: Endoscopy;  Laterality: N/A;   CHOLECYSTECTOMY     COLONOSCOPY     COLONOSCOPY N/A 08/13/2017    Procedure: COLONOSCOPY;  Surgeon: Daneil Dolin, MD;  [facility];  Service: Endoscopy;  Laterality: N/A;  8:30 AM   COLONOSCOPY WITH PROPOFOL N/A 01/18/2021   [name]: Multiple tubular adenomas removed, next colonoscopy in 3 years   ESOPHAGOGASTRODUODENOSCOPY N/A 09/11/2016   Procedure: ESOPHAGOGASTRODUODENOSCOPY (EGD);  Surgeon: Daneil Dolin, MD;  [facility];  Service: Endoscopy;  Laterality: N/A;  7:45 am - moved to 10/11 @ 10:30   ESOPHAGOGASTRODUODENOSCOPY (EGD) WITH PROPOFOL N/A 09/17/2020   [name]: Diffuse moderate inflammation characterized by erosions and erythema found in the entire stomach.  Biopsies positive for H. pylori.  Patient has not been treated due to drug allergies.   Heel spurs Left    MALONEY DILATION N/A 09/11/2016   Procedure: Venia Minks DILATION;  Surgeon: Daneil Dolin, MD;  [facility];  Service: Endoscopy;  Laterality: N/A;   POLYPECTOMY  08/13/2017   Procedure: POLYPECTOMY;  Surgeon: Daneil Dolin, MD;  [facility];  Service: Endoscopy;;  colon   POLYPECTOMY  01/18/2021   Procedure: POLYPECTOMY;  Surgeon: Daneil Dolin, MD;  [facility];  Service: Endoscopy;;   SHOULDER ARTHROSCOPY Right    TUBAL LIGATION     Patient Active Problem  List   Diagnosis Date Noted   Lumbar back pain 09/25/2022   Preventative health care 09/25/2022   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[place]) 07/01/2022   Elevated brain natriuretic peptide (BNP) level 06/19/2022   Insomnia 06/19/2022   Chest pain 06/18/2022   Hypomagnesemia 09/05/2021   Wheezing 03/06/2021   Sleep apnea 02/06/2021   Vitamin D deficiency 01/30/2021   Encounter to establish care 28/36/6294   Helicobacter pylori gastritis 11/01/2020   H/O adenomatous polyp of colon 10/31/2020   Constipation 10/15/2020   History of GI bleed 10/15/2020   Persistent atrial fibrillation ([place]) 10/10/2020   Secondary hypercoagulable state ([place]) 10/05/2020   Obesity, Class III, BMI  40-49.9 (morbid obesity) ([place])    Gastroesophageal reflux disease    Gastritis    Acquired hypothyroidism    Melena    CHF exacerbation ([place]) 09/15/2020   Atrial fibrillation, chronic ([place]) 08/23/2020   Stable angina 01/14/2020   Abnormal nuclear stress test 12/08/2019   Microcytic anemia 11/28/2019   Mixed hyperlipidemia 11/27/2019   OA (osteoarthritis) of knee 05/18/2019   DDD (degenerative disc disease), lumbar 05/18/2019   Obesity 12/26/2017   Seasonal allergies 08/19/2016   Glucose intolerance (impaired glucose tolerance) 05/14/2016   Chronic hip pain 05/14/2016   Chronic combined systolic and diastolic heart failure ([place]) 05/14/2016   Hypothyroidism following radioiodine therapy 11/30/2015   CAD (coronary artery disease)    NSTEMI (non-ST elevated myocardial infarction) ([place])    Essential hypertension 06/05/2015    PCP: Olin Hauser, PCP  REFERRING PROVIDER: Olin Hauser, PCP  REFERRING DIAG: M54.50,G89.29 (ICD-10-CM) - Chronic bilateral low back pain without sciatica  Rationale for Evaluation and Treatment: Rehabilitation  THERAPY DIAG:  Low back pain, unspecified back pain laterality, unspecified chronicity, unspecified whether sciatica present  Other symptoms and signs involving the musculoskeletal system  ONSET DATE: increased pain 2 months ago  SUBJECTIVE:                                                                                                                                                                                           SUBJECTIVE STATEMENT: Chronic low back pain but now is having daily pain.  Increased with standing and walking; has trouble grocery shopping, cooking etc. 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  MD gave her flexeril; didn't help much.    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 Yes: NPRS scale: 7/10 Pain location: pain in low back and left side  Pain description: aching, sore Aggravating factors: mornings, prolonged stand or walk Relieving factors: sit in recliner and don't move  PRECAUTIONS: None  WEIGHT BEARING RESTRICTIONS: No  FALLS:  Has patient fallen in last 6 months? No  LIVING ENVIRONMENT: Lives with: lives with their family Lives in: Mobile home Stairs: Yes: External: 5 steps; on right going up, on left going up, and can reach both Has following equipment at home: Single point cane and Wheelchair (manual)  OCCUPATION: retired  PLOF: Independent  PATIENT GOALS: walk and stand longer without pain  NEXT MD VISIT:   OBJECTIVE:   DIAGNOSTIC FINDINGS:  No recent  PATIENT SURVEYS:  FOTO 40  COGNITION: Overall cognitive status: Within functional limits for tasks assessed     SENSATION: WFL Right hip neuropathy per patient   POSTURE: rounded shoulders, forward head, decreased lumbar lordosis, and increased thoracic kyphosis  PALPATION: Tender to upper lumbar and lower thoracic spine  LUMBAR ROM:   AROM eval  Flexion 50% available  Extension 20% available  Right lateral flexion   Left lateral flexion   Right rotation   Left rotation    (Blank rows = not tested)   LOWER EXTREMITY MMT:    MMT Right eval Left eval  Hip flexion 4+ 4+  Hip extension 3- 3-  Hip abduction    Hip adduction    Hip internal rotation    Hip external rotation    Knee flexion 4 4-  Knee extension 5 5  Ankle dorsiflexion 5 5  Ankle plantarflexion    Ankle inversion    Ankle eversion     (Blank rows = not tested)   FUNCTIONAL TESTS:  5 times sit to stand: 49 sec   TODAY'S TREATMENT:                                                                                                                              DATE: 10/17/22 physical therapy evaluation and HEP     Education details: Patient educated on exam findings, POC,  scope of PT, HEP. Person educated: Patient Education method: Explanation, Demonstration, and Handouts Education comprehension: verbalized understanding, returned demonstration, verbal cues required, and tactile cues required   HOME EXERCISE PROGRAM: Access Code: [facility] URL: [email or web address] Date: 10/17/2022 Prepared by: [facility] - Re[facility]  Exercises - Sit to Stand  - 2 x daily - 7 x weekly - 1 sets - 10 reps - Supine Lower Trunk Rotation  - 2 x daily - 7 x weekly - 1 sets - 10 reps - Supine Scapular Retraction  - 2 x daily - 7 x weekly - 1 sets - 10 reps  ASSESSMENT:  CLINICAL IMPRESSION: Patient is a 71 y.o. female who was seen today for physical therapy evaluation and treatment for M54.50,G89.29 (ICD-10-CM) - Chronic bilateral low back pain without sciatica.Patient demonstrates muscle weakness, reduced ROM, and fascial restrictions which are likely contributing to symptoms of pain and are negatively impacting patient ability  to perform ADLs and functional mobility tasks. Patient will benefit from skilled physical therapy services to address these deficits to reduce pain and improve level of function with ADLs and functional mobility tasks.   OBJECTIVE IMPAIRMENTS: Abnormal gait, cardiopulmonary status limiting activity, decreased activity tolerance, decreased endurance, decreased mobility, difficulty walking, decreased ROM, decreased strength, hypomobility, increased fascial restrictions, impaired perceived functional ability, increased muscle spasms, impaired flexibility, impaired sensation, and pain.   ACTIVITY LIMITATIONS: carrying, lifting, bending, sitting, standing, squatting, sleeping, stairs, transfers, bed mobility, continence, bathing, locomotion level, and caring for others  PARTICIPATION LIMITATIONS: meal prep, cleaning, laundry, shopping, community activity, and yard work  PERSONAL FACTORS: 1-2 comorbidities: afib, CHF  are also affecting patient's functional  outcome.   REHAB POTENTIAL: Good  CLINICAL DECISION MAKING: Stable/uncomplicated  EVALUATION COMPLEXITY: Low   GOALS: Goals reviewed with patient? No  SHORT TERM GOALS: Target date: 11/07/2022  Patient will be independent with initial HEP Baseline: Goal status: INITIAL  2.   Patient will report at least 50% improvement in overall symptoms and/or function to demonstrate improved functional mobility  Baseline:  Goal status: INITIAL   LONG TERM GOALS: Target date: 11/21/2022  Patient will be independent in self management strategies to improve quality of life and functional outcomes.   Baseline:  Goal status: INITIAL  2.   Patient will report at least 75% improvement in overall symptoms and/or function to demonstrate improved functional mobility  Baseline:  Goal status: INITIAL  3.  Patient will increase lower extremity MMTs to 4+-5/5 without pain to promote return to ambulation community distances with minimal deviation.  Baseline:  Goal status: INITIAL  4.  Patient will improve FOTO score to predicted value  Baseline: 40 Goal status: INITIAL  5.  Patient will improve 5 times sit to stand score from 49 sec to 20 sec to demonstrate improved functional mobility and increased lower extremity strength.  Baseline:  Goal status: INITIAL  PLAN:  PT FREQUENCY: 2x/week  PT DURATION: 4 weeks  Interventions:Therapeutic exercises, Therapeutic activity, Neuromuscular re-education, Balance training, Gait training, Patient/Family education, Joint manipulation, Joint mobilization, Stair training, Orthotic/Fit training, DME instructions, Aquatic Therapy, Dry Needling, Electrical stimulation, Spinal manipulation, Spinal mobilization, Cryotherapy, Moist heat, Compression bandaging, scar mobilization, Splintting, Taping, Traction, Ultrasound, Ionotophoresis '4mg'$ /ml Dexamethasone, and Manual therapy  PLAN FOR NEXT SESSION: review HEP and goals; progress lumbar mobility and core and  lower extremity strengthening as able   11:27 AM, 10/17/22 Delvina Mizzell Small Nakia Koble MPT [place] [facility] [place] [phone] Ph:[phone]

## 2022-10-15 NOTE — ED Provider Notes (Signed)
[facility]    CSN: [id number] Arrival date & time: 10/14/22  0810      History   Chief Complaint Chief Complaint  Patient presents with   Cough    HPI Maria Fry is a 71 y.o. female.   Patient presenting today with 4-day history of cough, congestion, sneezing.  Denies fever, chills, chest pain, shortness of breath, abdominal pain, nausea vomiting or diarrhea.  So far trying over-the-counter medications such as Mucinex with minimal relief.  No known sick contacts recently.  No known pertinent chronic medical problems per patient.    Past Medical History:  Diagnosis Date   Arthritis    Right knee   Atrial fibrillation (HCC)    CAD (coronary artery disease)    a. 06/07/15 NSTEMI/Cath: Dist LAD 100%, mid LAD 10%. Otw nl cors. Nl EF w/ inferoapical AK.   Essential hypertension    Hip pain    History of cardiomyopathy 04/23/2016   a. 04/2016 Echo: EF 45%, Gr2 DD; b. 11/2019 Echo: EF 45-50%, mod LVH. Gr2 DD. Nl RV fxn. Sev dil LA.    Hyperlipidemia    Hypothyroidism    NSTEMI (non-ST elevated myocardial infarction) ([place]) 06/05/2015   Vitamin D deficiency     Patient Active Problem List   Diagnosis Date Noted   Lumbar back pain 09/25/2022   Preventative health care 09/25/2022   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[place]) 07/01/2022   Elevated brain natriuretic peptide (BNP) level 06/19/2022   Insomnia 06/19/2022   Chest pain 06/18/2022   Hypomagnesemia 09/05/2021   Wheezing 03/06/2021   Sleep apnea 02/06/2021   Vitamin D deficiency 01/30/2021   Encounter to establish care 43/15/4008   Helicobacter pylori gastritis 11/01/2020   H/O adenomatous polyp of colon 10/31/2020   Constipation 10/15/2020   History of GI bleed 10/15/2020   Persistent atrial fibrillation ([place]) 10/10/2020   Secondary hypercoagulable state ([place]) 10/05/2020   Obesity, Class III, BMI 40-49.9 (morbid obesity) ([place])    Gastroesophageal reflux disease    Gastritis    Acquired hypothyroidism    Melena     CHF exacerbation ([place]) 09/15/2020   Atrial fibrillation, chronic ([place]) 08/23/2020   Stable angina 01/14/2020   Abnormal nuclear stress test 12/08/2019   Microcytic anemia 11/28/2019   Mixed hyperlipidemia 11/27/2019   OA (osteoarthritis) of knee 05/18/2019   DDD (degenerative disc disease), lumbar 05/18/2019   Obesity 12/26/2017   Seasonal allergies 08/19/2016   Glucose intolerance (impaired glucose tolerance) 05/14/2016   Chronic hip pain 05/14/2016   Chronic combined systolic and diastolic heart failure ([place]) 05/14/2016   Hypothyroidism following radioiodine therapy 11/30/2015   CAD (coronary artery disease)    NSTEMI (non-ST elevated myocardial infarction) ([place])    Essential hypertension 06/05/2015    Past Surgical History:  Procedure Laterality Date   ABDOMINAL HYSTERECTOMY     partial- one ovary remaining   BIOPSY  09/17/2020   Procedure: BIOPSY;  Surgeon: [name], DO;  [facility];  Service: Endoscopy;;   CARDIAC CATHETERIZATION N/A 06/06/2015   Procedure: Left Heart Cath and Coronary Angiography;  Surgeon: Peter M Martinique, MD;  Location: [facility];  Service: Cardiovascular;  Laterality: N/A;   CARDIOVERSION N/A 09/14/2020   Procedure: CARDIOVERSION;  Surgeon: Satira Sark, MD;  [facility]: [facility];  Service: Cardiovascular;  Laterality: N/A;   CARDIOVERSION N/A 06/20/2022   Procedure: CARDIOVERSION;  Surgeon: Arnoldo Lenis, MD;  Location: [facility];  Service: Endoscopy;  Laterality: N/A;  CHOLECYSTECTOMY     COLONOSCOPY     COLONOSCOPY N/A 08/13/2017   Procedure: COLONOSCOPY;  Surgeon: Daneil Dolin, MD;  [facility];  Service: Endoscopy;  Laterality: N/A;  8:30 AM   COLONOSCOPY WITH PROPOFOL N/A 01/18/2021   [name]: Multiple tubular adenomas removed, next colonoscopy in 3 years   ESOPHAGOGASTRODUODENOSCOPY N/A 09/11/2016   Procedure: ESOPHAGOGASTRODUODENOSCOPY (EGD);  Surgeon: Daneil Dolin, MD;  [facility];  Service: Endoscopy;  Laterality: N/A;  7:45 am - moved to 10/11 @ 10:30   ESOPHAGOGASTRODUODENOSCOPY (EGD) WITH PROPOFOL N/A 09/17/2020   [name]: Diffuse moderate inflammation characterized by erosions and erythema found in the entire stomach.  Biopsies positive for H. pylori.  Patient has not been treated due to drug allergies.   Heel spurs Left    MALONEY DILATION N/A 09/11/2016   Procedure: Venia Minks DILATION;  Surgeon: Daneil Dolin, MD;  [facility];  Service: Endoscopy;  Laterality: N/A;   POLYPECTOMY  08/13/2017   Procedure: POLYPECTOMY;  Surgeon: Daneil Dolin, MD;  [facility];  Service: Endoscopy;;  colon   POLYPECTOMY  01/18/2021   Procedure: POLYPECTOMY;  Surgeon: Daneil Dolin, MD;  [facility];  Service: Endoscopy;;   SHOULDER ARTHROSCOPY Right    TUBAL LIGATION      OB History     Gravida  3   Para  3   Term  3   Preterm      AB      Living  3      SAB      IAB      Ectopic      Multiple      Live Births               Home Medications    Prior to Admission medications   Medication Sig Start Date End Date Taking? Authorizing Provider  azelastine (ASTELIN) 0.1 % nasal spray Place 1 spray into both nostrils 2 (two) times daily. Use in each nostril as directed 10/14/22  Yes Volney American, PA-C  promethazine-dextromethorphan (PROMETHAZINE-DM) 6.25-15 MG/5ML syrup Take 5 mLs by mouth 4 (four) times daily as needed. 10/14/22  Yes Volney American, PA-C  acetaminophen (TYLENOL) 500 MG tablet Take 500 mg by mouth every 6 (six) hours as needed for mild pain or moderate pain.    [provider]  amLODipine (NORVASC) 2.5 MG tablet Take 1 tablet (2.5 mg total) by mouth daily. 08/20/22   Satira Sark, MD  atorvastatin (LIPITOR) 80 MG tablet Take 1 tablet (80 mg total) by mouth at bedtime. 04/25/22   Paseda, Dewaine Conger, FNP  calcium carbonate (OS-CAL - DOSED IN MG OF ELEMENTAL CALCIUM)  1250 (500 Ca) MG tablet Take 500 mg by mouth daily.    [provider]  Cholecalciferol (VITAMIN D3) 50 MCG (2000 UT) capsule Take 1 capsule (2,000 Units total) by mouth daily. 03/07/22   Paseda, Dewaine Conger, FNP  cyclobenzaprine (FLEXERIL) 5 MG tablet Take 1 tablet (5 mg total) by mouth 3 (three) times daily as needed for muscle spasms. 09/19/22   Johnette Abraham, MD  diclofenac Sodium (VOLTAREN) 1 % GEL Apply 1 application topically 4 (four) times daily as needed (pain).    [provider]  dofetilide (TIKOSYN) 125 MCG capsule Take 1 capsule by mouth twice daily 09/06/22   Fenton, Clint R, PA  ELIQUIS 5 MG TABS tablet Take 1 tablet by mouth twice daily  09/06/22   Fenton, Clint R, PA  ferrous sulfate 325 (65 FE) MG EC tablet Take 1 tablet (325 mg total) by mouth daily. 07/02/22   Alvira Monday, FNP  furosemide (LASIX) 40 MG tablet Take 1 tablet (40 mg total) by mouth daily. 06/21/22   Barton Dubois, MD  gabapentin (NEURONTIN) 300 MG capsule Take 1 capsule (300 mg total) by mouth 2 (two) times daily. 09/10/21   Noreene Larsson, NP  levothyroxine (SYNTHROID) 100 MCG tablet Take 1 tablet (100 mcg total) by mouth daily before breakfast. 07/23/22   Nida, Marella Chimes, MD  loratadine (CLARITIN) 10 MG tablet Take 10 mg by mouth daily.    [provider]  magnesium oxide (MAG-OX) 400 MG tablet Take 0.5 tablets (200 mg total) by mouth daily. 10/24/20   Fenton, Clint R, PA  melatonin 3 MG TABS tablet Take 3 mg by mouth at bedtime.    [provider]  metoprolol tartrate (LOPRESSOR) 25 MG tablet Take 0.5 tablets (12.5 mg total) by mouth 2 (two) times daily. 07/08/22   Imogene Burn, PA-C  nitroGLYCERIN (NITROSTAT) 0.4 MG SL tablet DISSOLVE ONE TABLET UNDER THE TONGUE EVERY 5 MINUTES AS NEEDED FOR CHEST PAIN.  DO NOT EXCEED A TOTAL OF 3 DOSES IN 15 MINUTES Patient taking differently: DISSOLVE ONE TABLET UNDER THE TONGUE EVERY 5 MINUTES AS NEEDED FOR CHEST PAIN.  DO NOT EXCEED A  TOTAL OF 3 DOSES IN 15 MINUTES 01/08/21   Verta Ellen., NP  ondansetron (ZOFRAN-ODT) 4 MG disintegrating tablet Take 1 tablet (4 mg total) by mouth every 8 (eight) hours as needed for nausea or vomiting. 10/25/21   Maudie Flakes, MD  potassium chloride SA (KLOR-CON M) 20 MEQ tablet Take 2 tablets (40 mEq total) by mouth daily. 06/21/22   Barton Dubois, MD  spironolactone (ALDACTONE) 25 MG tablet Take 1/2 (one-half) tablet by mouth once daily 08/07/22   Satira Sark, MD  vitamin C (ASCORBIC ACID) 500 MG tablet Take 500 mg by mouth daily.    [provider]    Family History Family History  Problem Relation Age of Onset   Heart failure Father        Deceased   Heart attack Father        Deceased   Stroke Sister        Deceased   Heart failure Brother        Deceased   Cancer Brother        Deceased   Heart attack Brother        Deceased   COPD Daughter    Arthritis Daughter    Lupus Daughter    Colon cancer Neg Hx     Social History Social History   Tobacco Use   Smoking status: Former    Packs/day: 1.00    Years: 20.00    Total pack years: 20.00    Types: Cigarettes    Start date: 12/02/1977    Quit date: 12/02/2002    Years since quitting: 19.8   Smokeless tobacco: Never   Tobacco comments:    Former smoker 05/10/22  Vaping Use   Vaping Use: Never used  Substance Use Topics   Alcohol use: No    Alcohol/week: 0.0 standard drinks of alcohol   Drug use: No     Allergies   Afrin [oxymetazoline], Amoxicillin, Lisinopril, Ranolazine, Vicodin [hydrocodone-acetaminophen], Demerol [meperidine], Flonase [fluticasone], and Lodine [etodolac]   Review of Systems Review of Systems Per  HPI  Physical Exam Triage Vital Signs ED Triage Vitals  Enc Vitals Group     BP 10/14/22 0843 127/87     Pulse Rate 10/14/22 0843 63     Resp 10/14/22 0843 18     Temp 10/14/22 0843 98.3 F (36.8 C)     Temp Source 10/14/22 0843 Oral     SpO2 10/14/22 0843 98 %      Weight --      Height --      Head Circumference --      Peak Flow --      Pain Score 10/14/22 0842 0     Pain Loc --      Pain Edu? --      Excl. in [place]? --    No data found.  Updated Vital Signs BP 127/87 (BP Location: Right Arm)   Pulse 63   Temp 98.3 F (36.8 C) (Oral)   Resp 18   SpO2 98%   Visual Acuity Right Eye Distance:   Left Eye Distance:   Bilateral Distance:    Right Eye Near:   Left Eye Near:    Bilateral Near:     Physical Exam Vitals and nursing note reviewed.  Constitutional:      Appearance: Normal appearance. She is not ill-appearing.  HENT:     Head: Atraumatic.     Right Ear: Tympanic membrane and external ear normal.     Left Ear: Tympanic membrane and external ear normal.     Nose: Rhinorrhea present.     Mouth/Throat:     Mouth: Mucous membranes are moist.     Pharynx: Posterior oropharyngeal erythema present.  Eyes:     Extraocular Movements: Extraocular movements intact.     Conjunctiva/sclera: Conjunctivae normal.  Cardiovascular:     Rate and Rhythm: Normal rate and regular rhythm.     Heart sounds: Normal heart sounds.  Pulmonary:     Effort: Pulmonary effort is normal.     Breath sounds: Normal breath sounds. No wheezing or rales.  Musculoskeletal:        General: Normal range of motion.     Cervical back: Normal range of motion and neck supple.  Skin:    General: Skin is warm and dry.  Neurological:     Mental Status: She is alert and oriented to person, place, and time.  Psychiatric:        Mood and Affect: Mood normal.        Thought Content: Thought content normal.        Judgment: Judgment normal.      UC Treatments / Results  Labs (all labs ordered are listed, but only abnormal results are displayed) Labs Reviewed  RESP PANEL BY RT-PCR (FLU A&B, COVID) [id number]    EKG   Radiology No results found.  Procedures Procedures (including critical care time)  Medications Ordered in UC Medications - No data to  display  Initial Impression / Assessment and Plan / UC Course  I have reviewed the triage vital signs and the nursing notes.  Pertinent labs & imaging results that were available during my care of the patient were reviewed by me and considered in my medical decision making (see chart for details).     Vitals and exam overall reassuring and suggestive of a viral upper respiratory infection, respiratory panel pending, treat with Phenergan DM, Astelin nasal spray, supportive over-the-counter medications and home care.  Return for worsening symptoms.  Final Clinical Impressions(s) /  UC Diagnoses   Final diagnoses:  Viral URI with cough   Discharge Instructions   None    ED Prescriptions     Medication Sig Dispense Auth. Provider   promethazine-dextromethorphan (PROMETHAZINE-DM) 6.25-15 MG/5ML syrup Take 5 mLs by mouth 4 (four) times daily as needed. 100 mL Volney American, PA-C   azelastine (ASTELIN) 0.1 % nasal spray Place 1 spray into both nostrils 2 (two) times daily. Use in each nostril as directed 30 mL Volney American, PA-C      PDMP not reviewed this encounter.   Volney American, [place] 10/15/22 2034

## 2022-10-17 ENCOUNTER — Ambulatory Visit (HOSPITAL_COMMUNITY): Payer: Medicare Other | Attending: Internal Medicine

## 2022-10-17 DIAGNOSIS — M545 Low back pain, unspecified: Secondary | ICD-10-CM | POA: Insufficient documentation

## 2022-10-17 DIAGNOSIS — G8929 Other chronic pain: Secondary | ICD-10-CM | POA: Diagnosis not present

## 2022-10-17 DIAGNOSIS — R29898 Other symptoms and signs involving the musculoskeletal system: Secondary | ICD-10-CM | POA: Diagnosis present

## 2022-11-04 ENCOUNTER — Ambulatory Visit (HOSPITAL_COMMUNITY): Payer: Medicare Other | Attending: Internal Medicine | Admitting: Physical Therapy

## 2022-11-04 DIAGNOSIS — R29898 Other symptoms and signs involving the musculoskeletal system: Secondary | ICD-10-CM | POA: Insufficient documentation

## 2022-11-04 DIAGNOSIS — M545 Low back pain, unspecified: Secondary | ICD-10-CM | POA: Diagnosis present

## 2022-11-04 DIAGNOSIS — M6281 Muscle weakness (generalized): Secondary | ICD-10-CM | POA: Insufficient documentation

## 2022-11-04 NOTE — Therapy (Signed)
OUTPATIENT PHYSICAL THERAPY TREATMENT Patient Name: Maria Fry MRN: [id number] DOB:11/01/1951, 71 y.o., female Today's Date: 11/04/2022   PT End of Session - 11/04/22 1345     Visit Number 2    Number of Visits 8    Date for PT Re-Evaluation 11/21/22    Authorization Type [employer] Medicare    Authorization Time Period no VL, 20% co insurance, no auth, no copay    Progress Note Due on Visit 10    PT Start Time 0786    PT Stop Time 1425    PT Time Calculation (min) 40 min    Behavior During Therapy WFL for tasks assessed/performed             Past Medical History:  Diagnosis Date   Arthritis    Right knee   Atrial fibrillation (HCC)    CAD (coronary artery disease)    a. 06/07/15 NSTEMI/Cath: Dist LAD 100%, mid LAD 10%. Otw nl cors. Nl EF w/ inferoapical AK.   Essential hypertension    Hip pain    History of cardiomyopathy 04/23/2016   a. 04/2016 Echo: EF 45%, Gr2 DD; b. 11/2019 Echo: EF 45-50%, mod LVH. Gr2 DD. Nl RV fxn. Sev dil LA.    Hyperlipidemia    Hypothyroidism    NSTEMI (non-ST elevated myocardial infarction) ([place]) 06/05/2015   Vitamin D deficiency    Past Surgical History:  Procedure Laterality Date   ABDOMINAL HYSTERECTOMY     partial- one ovary remaining   BIOPSY  09/17/2020   Procedure: BIOPSY;  Surgeon: [name], DO;  [facility]: [facility];  Service: Endoscopy;;   CARDIAC CATHETERIZATION N/A 06/06/2015   Procedure: Left Heart Cath and Coronary Angiography;  Surgeon: Peter M Martinique, MD;  Location: [facility];  Service: Cardiovascular;  Laterality: N/A;   CARDIOVERSION N/A 09/14/2020   Procedure: CARDIOVERSION;  Surgeon: Satira Sark, MD;  [facility]: [facility];  Service: Cardiovascular;  Laterality: N/A;   CARDIOVERSION N/A 06/20/2022   Procedure: CARDIOVERSION;  Surgeon: Arnoldo Lenis, MD;  Location: [facility];  Service: Endoscopy;  Laterality: N/A;   CHOLECYSTECTOMY     COLONOSCOPY     COLONOSCOPY N/A 08/13/2017   Procedure:  COLONOSCOPY;  Surgeon: Daneil Dolin, MD;  [facility]: [facility];  Service: Endoscopy;  Laterality: N/A;  8:30 AM   COLONOSCOPY WITH PROPOFOL N/A 01/18/2021   [name]: Multiple tubular adenomas removed, next colonoscopy in 3 years   ESOPHAGOGASTRODUODENOSCOPY N/A 09/11/2016   Procedure: ESOPHAGOGASTRODUODENOSCOPY (EGD);  Surgeon: Daneil Dolin, MD;  [facility];  Service: Endoscopy;  Laterality: N/A;  7:45 am - moved to 10/11 @ 10:30   ESOPHAGOGASTRODUODENOSCOPY (EGD) WITH PROPOFOL N/A 09/17/2020   [name]: Diffuse moderate inflammation characterized by erosions and erythema found in the entire stomach.  Biopsies positive for H. pylori.  Patient has not been treated due to drug allergies.   Heel spurs Left    MALONEY DILATION N/A 09/11/2016   Procedure: Venia Minks DILATION;  Surgeon: Daneil Dolin, MD;  [facility];  Service: Endoscopy;  Laterality: N/A;   POLYPECTOMY  08/13/2017   Procedure: POLYPECTOMY;  Surgeon: Daneil Dolin, MD;  [facility];  Service: Endoscopy;;  colon   POLYPECTOMY  01/18/2021   Procedure: POLYPECTOMY;  Surgeon: Daneil Dolin, MD;  [facility];  Service: Endoscopy;;   SHOULDER ARTHROSCOPY Right    TUBAL LIGATION     Patient Active Problem List  Diagnosis Date Noted   Lumbar back pain 09/25/2022   Preventative health care 09/25/2022   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[place]) 07/01/2022   Elevated brain natriuretic peptide (BNP) level 06/19/2022   Insomnia 06/19/2022   Chest pain 06/18/2022   Hypomagnesemia 09/05/2021   Wheezing 03/06/2021   Sleep apnea 02/06/2021   Vitamin D deficiency 01/30/2021   Encounter to establish care 62/83/1517   Helicobacter pylori gastritis 11/01/2020   H/O adenomatous polyp of colon 10/31/2020   Constipation 10/15/2020   History of GI bleed 10/15/2020   Persistent atrial fibrillation ([place]) 10/10/2020   Secondary hypercoagulable state ([place]) 10/05/2020   Obesity, Class III, BMI 40-49.9 (morbid  obesity) ([place])    Gastroesophageal reflux disease    Gastritis    Acquired hypothyroidism    Melena    CHF exacerbation ([place]) 09/15/2020   Atrial fibrillation, chronic ([place]) 08/23/2020   Stable angina 01/14/2020   Abnormal nuclear stress test 12/08/2019   Microcytic anemia 11/28/2019   Mixed hyperlipidemia 11/27/2019   OA (osteoarthritis) of knee 05/18/2019   DDD (degenerative disc disease), lumbar 05/18/2019   Obesity 12/26/2017   Seasonal allergies 08/19/2016   Glucose intolerance (impaired glucose tolerance) 05/14/2016   Chronic hip pain 05/14/2016   Chronic combined systolic and diastolic heart failure ([place]) 05/14/2016   Hypothyroidism following radioiodine therapy 11/30/2015   CAD (coronary artery disease)    NSTEMI (non-ST elevated myocardial infarction) ([place])    Essential hypertension 06/05/2015    PCP: Olin Hauser, PCP  REFERRING PROVIDER: Olin Hauser, PCP  REFERRING DIAG: M54.50,G89.29 (ICD-10-CM) - Chronic bilateral low back pain without sciatica  Rationale for Evaluation and Treatment: Rehabilitation  THERAPY DIAG:  Low back pain, unspecified back pain laterality, unspecified chronicity, unspecified whether sciatica present  Other symptoms and signs involving the musculoskeletal system  Muscle weakness (generalized)  ONSET DATE: increased pain 2 months ago  SUBJECTIVE:                                                                                                                                                                                           SUBJECTIVE STATEMENT: Pt states her AFib is really making he feel bad and fatigued.  STates prolonged standing irritates her back most of all.  No issues if seated or laying at rest. No pain currently.  Eval: has trouble grocery shopping, cooking etc. 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  MD gave her flexeril; didn't help much.    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 No  PRECAUTIONS: None  WEIGHT BEARING RESTRICTIONS: No  FALLS:  Has patient fallen in last 6 months? No  LIVING ENVIRONMENT: Lives with: lives with their family Lives in: Mobile home Stairs: Yes: External: 5 steps; on right going up, on left going up, and can reach both Has following equipment at home: Single point cane and Wheelchair (manual)  OCCUPATION: retired  PLOF: Independent  PATIENT GOALS: walk and stand longer without pain  NEXT MD VISIT:   OBJECTIVE:   DIAGNOSTIC FINDINGS:  No recent  PATIENT SURVEYS:  FOTO 40  COGNITION: Overall cognitive status: Within functional limits for tasks assessed     SENSATION: WFL Right hip neuropathy per patient   POSTURE: rounded shoulders, forward head, decreased lumbar lordosis, and increased thoracic kyphosis  PALPATION: Tender to upper lumbar and lower thoracic spine  LUMBAR ROM:   AROM eval  Flexion 50% available  Extension 20% available  Right lateral flexion   Left lateral flexion   Right rotation   Left rotation    (Blank rows = not tested)   LOWER EXTREMITY MMT:    MMT Right eval Left eval  Hip flexion 4+ 4+  Hip extension 3- 3-  Hip abduction    Hip adduction    Hip internal rotation    Hip external rotation    Knee flexion 4 4-  Knee extension 5 5  Ankle dorsiflexion 5 5  Ankle plantarflexion    Ankle inversion    Ankle eversion     (Blank rows = not tested)   FUNCTIONAL TESTS:  5 times sit to stand: 49 sec   TODAY'S TREATMENT:                                                                                                                              DATE:  11/04/22 Review of goals, HEP and POC moving forward Supine:  Lower trunk rotation 10X10"each  Decompression 2-5 10X5" each  Bridge 10X  SLR 10X each with core stab Prone:   hamstring curls 10X each  Isometric hip extension (too weak to lift) 10X each  Heelsqueeze 10X5" Sidelying hip abduction 10X each side     10/17/22 physical therapy evaluation and HEP     Education details: Patient educated on exam findings, POC, scope of PT, HEP. Person educated: Patient Education method: Explanation, Demonstration, and Handouts Education comprehension: verbalized understanding, returned demonstration, verbal cues required, and tactile cues required   HOME EXERCISE PROGRAM: Access Code: [facility] URL: [email or web address] Date: 10/17/2022 Prepared by: [facility]  Exercises - Sit to Stand  - 2 x daily - 7 x weekly - 1 sets - 10 reps - Supine Lower Trunk Rotation  - 2 x daily - 7 x weekly - 1 sets - 10 reps - Supine Scapular Retraction  - 2 x daily - 7 x weekly - 1 sets - 10 reps  ASSESSMENT:  CLINICAL IMPRESSION: Reviewed goals, HEP and POC moving forward.  Pt is limited currently by A-Fib causing fatigue and shortness of breath.  Required rests throughout session today.  Continued with established therex with additional LE strengthening.  Cramping in hamstrings with bridge and curls but able to complete with rests.  Unable to lift LE with hip extensions so began with heelsqueezes. Walked patient back down to main entrance with one rest needed.    Patient will benefit from skilled physical therapy services to address these deficits to reduce pain and improve level of function with ADLs and functional mobility tasks.   OBJECTIVE IMPAIRMENTS: Abnormal gait, cardiopulmonary status limiting activity, decreased activity tolerance, decreased endurance, decreased mobility, difficulty walking, decreased ROM, decreased strength, hypomobility, increased fascial restrictions, impaired perceived functional ability, increased muscle spasms, impaired flexibility, impaired sensation, and pain.   ACTIVITY LIMITATIONS: carrying, lifting, bending, sitting, standing,  squatting, sleeping, stairs, transfers, bed mobility, continence, bathing, locomotion level, and caring for others  PARTICIPATION LIMITATIONS: meal prep, cleaning, laundry, shopping, community activity, and yard work  PERSONAL FACTORS: 1-2 comorbidities: afib, CHF  are also affecting patient's functional outcome.   REHAB POTENTIAL: Good  CLINICAL DECISION MAKING: Stable/uncomplicated  EVALUATION COMPLEXITY: Low   GOALS: Goals reviewed with patient? Yes  SHORT TERM GOALS: Target date: 11/07/2022  Patient will be independent with initial HEP Baseline: Goal status: IN PROGRESS  2.   Patient will report at least 50% improvement in overall symptoms and/or function to demonstrate improved functional mobility  Baseline:  Goal status: IN PROGRESS   LONG TERM GOALS: Target date: 11/21/2022  Patient will be independent in self management strategies to improve quality of life and functional outcomes.   Baseline:  Goal status: IN PROGRESS  2.   Patient will report at least 75% improvement in overall symptoms and/or function to demonstrate improved functional mobility  Baseline:  Goal status: IN PROGRESS  3.  Patient will increase lower extremity MMTs to 4+-5/5 without pain to promote return to ambulation community distances with minimal deviation.  Baseline:  Goal status: IN PROGRESS  4.  Patient will improve FOTO score to predicted value  Baseline: 40 Goal status: IN PROGRESS  5.  Patient will improve 5 times sit to stand score from 49 sec to 20 sec to demonstrate improved functional mobility and increased lower extremity strength.  Baseline:  Goal status: IN PROGRESS  PLAN:  PT FREQUENCY: 2x/week  PT DURATION: 4 weeks  Interventions:Therapeutic exercises, Therapeutic activity, Neuromuscular re-education, Balance training, Gait training, Patient/Family education, Joint manipulation, Joint mobilization, Stair training, Orthotic/Fit training, DME instructions, Aquatic  Therapy, Dry Needling, Electrical stimulation, Spinal manipulation, Spinal mobilization, Cryotherapy, Moist heat, Compression bandaging, scar mobilization, Splintting, Taping, Traction, Ultrasound, Ionotophoresis '4mg'$ /ml Dexamethasone, and Manual therapy  PLAN FOR NEXT SESSION: Continue to progress lumbar mobility and core and lower extremity strengthening as able.  Pt requires frequent rests due to fatigue from A-Fib.   1:57 PM, 11/04/22  Teena Irani, PTA/CLT [place] Ph: [phone]

## 2022-11-06 ENCOUNTER — Encounter (HOSPITAL_COMMUNITY): Payer: Medicare Other | Admitting: Physical Therapy

## 2022-11-11 ENCOUNTER — Ambulatory Visit (HOSPITAL_COMMUNITY): Payer: Medicare Other

## 2022-11-11 DIAGNOSIS — M6281 Muscle weakness (generalized): Secondary | ICD-10-CM

## 2022-11-11 DIAGNOSIS — M545 Low back pain, unspecified: Secondary | ICD-10-CM | POA: Diagnosis not present

## 2022-11-11 DIAGNOSIS — R29898 Other symptoms and signs involving the musculoskeletal system: Secondary | ICD-10-CM

## 2022-11-11 NOTE — Therapy (Signed)
OUTPATIENT PHYSICAL THERAPY TREATMENT Patient Name: Maria Fry MRN: [id number] DOB:Apr 03, 1951, 71 y.o., female Today's Date: 11/11/2022   PT End of Session - 11/11/22 0939     Visit Number 3    Number of Visits 8    Date for PT Re-Evaluation 11/21/22    Authorization Type [employer] Medicare    Authorization Time Period no VL, 20% co insurance, no auth, no copay    Progress Note Due on Visit 10    PT Start Time [phone]    PT Stop Time 1020    PT Time Calculation (min) 42 min    Behavior During Therapy [facility] for tasks assessed/performed             Past Medical History:  Diagnosis Date   Arthritis    Right knee   Atrial fibrillation (HCC)    CAD (coronary artery disease)    a. 06/07/15 NSTEMI/Cath: Dist LAD 100%, mid LAD 10%. Otw nl cors. Nl EF w/ inferoapical AK.   Essential hypertension    Hip pain    History of cardiomyopathy 04/23/2016   a. 04/2016 Echo: EF 45%, Gr2 DD; b. 11/2019 Echo: EF 45-50%, mod LVH. Gr2 DD. Nl RV fxn. Sev dil LA.    Hyperlipidemia    Hypothyroidism    NSTEMI (non-ST elevated myocardial infarction) ([place]) 06/05/2015   Vitamin D deficiency    Past Surgical History:  Procedure Laterality Date   ABDOMINAL HYSTERECTOMY     partial- one ovary remaining   BIOPSY  09/17/2020   Procedure: BIOPSY;  Surgeon: [name], DO;  [facility];  Service: Endoscopy;;   CARDIAC CATHETERIZATION N/A 06/06/2015   Procedure: Left Heart Cath and Coronary Angiography;  Surgeon: Peter M Martinique, MD;  Location: [place] [facility];  Service: Cardiovascular;  Laterality: N/A;   CARDIOVERSION N/A 09/14/2020   Procedure: CARDIOVERSION;  Surgeon: Satira Sark, MD;  [facility];  Service: Cardiovascular;  Laterality: N/A;   CARDIOVERSION N/A 06/20/2022   Procedure: CARDIOVERSION;  Surgeon: Arnoldo Lenis, MD;  Location: [facility];  Service: Endoscopy;  Laterality: N/A;   CHOLECYSTECTOMY     COLONOSCOPY     COLONOSCOPY N/A 08/13/2017   Procedure:  COLONOSCOPY;  Surgeon: Daneil Dolin, MD;  [facility];  Service: Endoscopy;  Laterality: N/A;  8:30 AM   COLONOSCOPY WITH PROPOFOL N/A 01/18/2021   [name]: Multiple tubular adenomas removed, next colonoscopy in 3 years   ESOPHAGOGASTRODUODENOSCOPY N/A 09/11/2016   Procedure: ESOPHAGOGASTRODUODENOSCOPY (EGD);  Surgeon: Daneil Dolin, MD;  [facility];  Service: Endoscopy;  Laterality: N/A;  7:45 am - moved to 10/11 @ 10:30   ESOPHAGOGASTRODUODENOSCOPY (EGD) WITH PROPOFOL N/A 09/17/2020   [name]: Diffuse moderate inflammation characterized by erosions and erythema found in the entire stomach.  Biopsies positive for H. pylori.  Patient has not been treated due to drug allergies.   Heel spurs Left    MALONEY DILATION N/A 09/11/2016   Procedure: Venia Minks DILATION;  Surgeon: Daneil Dolin, MD;  [facility];  Service: Endoscopy;  Laterality: N/A;   POLYPECTOMY  08/13/2017   Procedure: POLYPECTOMY;  Surgeon: Daneil Dolin, MD;  [facility];  Service: Endoscopy;;  colon   POLYPECTOMY  01/18/2021   Procedure: POLYPECTOMY;  Surgeon: Daneil Dolin, MD;  [facility];  Service: Endoscopy;;   SHOULDER ARTHROSCOPY Right    TUBAL LIGATION     Patient Active Problem List  Diagnosis Date Noted   Lumbar back pain 09/25/2022   Preventative health care 09/25/2022   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[place]) 07/01/2022   Elevated brain natriuretic peptide (BNP) level 06/19/2022   Insomnia 06/19/2022   Chest pain 06/18/2022   Hypomagnesemia 09/05/2021   Wheezing 03/06/2021   Sleep apnea 02/06/2021   Vitamin D deficiency 01/30/2021   Encounter to establish care 00/93/8182   Helicobacter pylori gastritis 11/01/2020   H/O adenomatous polyp of colon 10/31/2020   Constipation 10/15/2020   History of GI bleed 10/15/2020   Persistent atrial fibrillation ([place]) 10/10/2020   Secondary hypercoagulable state ([place]) 10/05/2020   Obesity, Class III, BMI 40-49.9 (morbid  obesity) ([place])    Gastroesophageal reflux disease    Gastritis    Acquired hypothyroidism    Melena    CHF exacerbation ([place]) 09/15/2020   Atrial fibrillation, chronic ([place]) 08/23/2020   Stable angina 01/14/2020   Abnormal nuclear stress test 12/08/2019   Microcytic anemia 11/28/2019   Mixed hyperlipidemia 11/27/2019   OA (osteoarthritis) of knee 05/18/2019   DDD (degenerative disc disease), lumbar 05/18/2019   Obesity 12/26/2017   Seasonal allergies 08/19/2016   Glucose intolerance (impaired glucose tolerance) 05/14/2016   Chronic hip pain 05/14/2016   Chronic combined systolic and diastolic heart failure ([place]) 05/14/2016   Hypothyroidism following radioiodine therapy 11/30/2015   CAD (coronary artery disease)    NSTEMI (non-ST elevated myocardial infarction) ([place])    Essential hypertension 06/05/2015    PCP: Olin Hauser, PCP  REFERRING PROVIDER: Olin Hauser, PCP  REFERRING DIAG: M54.50,G89.29 (ICD-10-CM) - Chronic bilateral low back pain without sciatica  Rationale for Evaluation and Treatment: Rehabilitation  THERAPY DIAG:  Low back pain, unspecified back pain laterality, unspecified chronicity, unspecified whether sciatica present  Other symptoms and signs involving the musculoskeletal system  Muscle weakness (generalized)  ONSET DATE: increased pain 2 months ago  SUBJECTIVE:                                                                                                                                                                                           SUBJECTIVE STATEMENT: No back pain but her activity is limited due to a-fib.  Patient reports fatigue and general malaise due to a-fib.  Sees Dr. Marlene Lard at the a-fib clinic on Friday 12/15  Eval: has trouble grocery shopping, cooking etc. 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  MD gave her flexeril; didn't help much.    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 No  PRECAUTIONS: None  WEIGHT BEARING RESTRICTIONS: No  FALLS:  Has patient fallen in last 6 months? No  LIVING ENVIRONMENT: Lives with: lives with their family Lives in: Mobile home Stairs: Yes: External: 5 steps; on right going up, on left going up, and can reach both Has following equipment at home: Single point cane and Wheelchair (manual)  OCCUPATION: retired  PLOF: Independent  PATIENT GOALS: walk and stand longer without pain  NEXT MD VISIT:   OBJECTIVE:   DIAGNOSTIC FINDINGS:  No recent  PATIENT SURVEYS:  FOTO 40  COGNITION: Overall cognitive status: Within functional limits for tasks assessed     SENSATION: WFL Right hip neuropathy per patient   POSTURE: rounded shoulders, forward head, decreased lumbar lordosis, and increased thoracic kyphosis  PALPATION: Tender to upper lumbar and lower thoracic spine  LUMBAR ROM:   AROM eval  Flexion 50% available  Extension 20% available  Right lateral flexion   Left lateral flexion   Right rotation   Left rotation    (Blank rows = not tested)   LOWER EXTREMITY MMT:    MMT Right eval Left eval  Hip flexion 4+ 4+  Hip extension 3- 3-  Hip abduction    Hip adduction    Hip internal rotation    Hip external rotation    Knee flexion 4 4-  Knee extension 5 5  Ankle dorsiflexion 5 5  Ankle plantarflexion    Ankle inversion    Ankle eversion     (Blank rows = not tested)   FUNCTIONAL TESTS:  5 times sit to stand: 49 sec   TODAY'S TREATMENT:                                                                                                                              DATE:  11/11/22 Supine: LTR x 20 Decompression 5" hold x 10; head press, shoulder press, leg press, leg lengthener  Bridge x 10 SLR x 10 each Abdominal set 5" hold x 10 Adominal set with  march 2 x 10 Hip abduction with RTB 2 x 10 Hip adduction with ball 5" x 10        11/04/22 Review of goals, HEP and POC moving forward Supine:  Lower trunk rotation 10X10"each  Decompression 2-5 10X5" each  Bridge 10X  SLR 10X each with core stab Prone:  hamstring curls 10X each  Isometric hip extension (too weak to lift) 10X each  Heelsqueeze 10X5" Sidelying hip abduction 10X each side     10/17/22 physical therapy evaluation and HEP     Education details: Patient educated on exam findings, POC, scope of PT, HEP. Person educated: Patient Education method: Explanation, Demonstration, and Handouts Education comprehension: verbalized understanding, returned demonstration, verbal cues required, and tactile cues required   HOME EXERCISE PROGRAM: Access Code: [facility] URL: [email or web address] Date: 11/11/2022 Prepared by: [facility] - [facility]  Exercises -  Sit to Stand  - 2 x daily - 7 x weekly - 1 sets - 10 reps - Supine Lower Trunk Rotation  - 2 x daily - 7 x weekly - 1 sets - 10 reps - Supine Scapular Retraction  - 2 x daily - 7 x weekly - 1 sets - 10 reps - Supine Bridge  - 2 x daily - 7 x weekly - 1 sets - 10 reps - Supine Active Straight Leg Raise  - 2 x daily - 7 x weekly - 1 sets - 10 reps - Supine Hip Adduction Isometric with Ball  - 2 x daily - 7 x weekly - 1 sets - 10 reps - 5 sec hold - Hooklying Clamshell with Resistance  - 2 x daily - 7 x weekly - 2 sets - 10 reps  Access Code: [email or web address] URL: [email or web address] Date: 10/17/2022 Prepared by: [facility] - [facility]  Exercises - Sit to Stand  - 2 x daily - 7 x weekly - 1 sets - 10 reps - Supine Lower Trunk Rotation  - 2 x daily - 7 x weekly - 1 sets - 10 reps - Supine Scapular Retraction  - 2 x daily - 7 x weekly - 1 sets - 10 reps  ASSESSMENT:  CLINICAL IMPRESSION:  Pt continues to be limited by A-Fib causing fatigue and shortness of breath.  Required rests throughout session today.  Added  abdominal sets today; patient needs max verbal, tactile cues and physical demonstration to perform correctly.  Often needs reminders for proper breathing technique.   Updated HEP.  Patient with some mild light headedness with supine to sit today. Patient will benefit from skilled physical therapy services to address these deficits to reduce pain and improve level of function with ADLs and functional mobility tasks.   OBJECTIVE IMPAIRMENTS: Abnormal gait, cardiopulmonary status limiting activity, decreased activity tolerance, decreased endurance, decreased mobility, difficulty walking, decreased ROM, decreased strength, hypomobility, increased fascial restrictions, impaired perceived functional ability, increased muscle spasms, impaired flexibility, impaired sensation, and pain.   ACTIVITY LIMITATIONS: carrying, lifting, bending, sitting, standing, squatting, sleeping, stairs, transfers, bed mobility, continence, bathing, locomotion level, and caring for others  PARTICIPATION LIMITATIONS: meal prep, cleaning, laundry, shopping, community activity, and yard work  PERSONAL FACTORS: 1-2 comorbidities: afib, CHF  are also affecting patient's functional outcome.   REHAB POTENTIAL: Good  CLINICAL DECISION MAKING: Stable/uncomplicated  EVALUATION COMPLEXITY: Low   GOALS: Goals reviewed with patient? Yes  SHORT TERM GOALS: Target date: 11/07/2022  Patient will be independent with initial HEP Baseline: Goal status: IN PROGRESS  2.   Patient will report at least 50% improvement in overall symptoms and/or function to demonstrate improved functional mobility  Baseline:  Goal status: IN PROGRESS   LONG TERM GOALS: Target date: 11/21/2022  Patient will be independent in self management strategies to improve quality of life and functional outcomes.   Baseline:  Goal status: IN PROGRESS  2.   Patient will report at least 75% improvement in overall symptoms and/or function to demonstrate  improved functional mobility  Baseline:  Goal status: IN PROGRESS  3.  Patient will increase lower extremity MMTs to 4+-5/5 without pain to promote return to ambulation community distances with minimal deviation.  Baseline:  Goal status: IN PROGRESS  4.  Patient will improve FOTO score to predicted value  Baseline: 40 Goal status: IN PROGRESS  5.  Patient will improve 5 times sit to stand score from 49 sec to 20 sec to demonstrate improved functional  mobility and increased lower extremity strength.  Baseline:  Goal status: IN PROGRESS  PLAN:  PT FREQUENCY: 2x/week  PT DURATION: 4 weeks  Interventions:Therapeutic exercises, Therapeutic activity, Neuromuscular re-education, Balance training, Gait training, Patient/Family education, Joint manipulation, Joint mobilization, Stair training, Orthotic/Fit training, DME instructions, Aquatic Therapy, Dry Needling, Electrical stimulation, Spinal manipulation, Spinal mobilization, Cryotherapy, Moist heat, Compression bandaging, scar mobilization, Splintting, Taping, Traction, Ultrasound, Ionotophoresis '4mg'$ /ml Dexamethasone, and Manual therapy  PLAN FOR NEXT SESSION: Continue to progress lumbar mobility and core and lower extremity strengthening as able.  Pt requires frequent rests due to fatigue from A-Fib.   10:01 AM, 11/11/22 Seydina Holliman Small Arvis Miguez MPT [facility] [place] [phone]

## 2022-11-13 ENCOUNTER — Ambulatory Visit (HOSPITAL_COMMUNITY): Payer: Medicare Other

## 2022-11-13 DIAGNOSIS — M545 Low back pain, unspecified: Secondary | ICD-10-CM

## 2022-11-13 DIAGNOSIS — R29898 Other symptoms and signs involving the musculoskeletal system: Secondary | ICD-10-CM

## 2022-11-13 DIAGNOSIS — M6281 Muscle weakness (generalized): Secondary | ICD-10-CM

## 2022-11-13 NOTE — Therapy (Signed)
OUTPATIENT PHYSICAL THERAPY TREATMENT Patient Name: Maria Fry MRN: [id number] DOB:[birth year], 71 y.o., female Today's Date: 11/13/2022   PT End of Session - 11/13/22 1305     Visit Number 4    Number of Visits 8    Date for PT Re-Evaluation 11/21/22    Authorization Type [employer] Medicare    Authorization Time Period no VL, 20% co insurance, no auth, no copay    Progress Note Due on Visit 10    PT Start Time 1305    PT Stop Time 1345    PT Time Calculation (min) 40 min    Behavior During Therapy [facility] for tasks assessed/performed             Past Medical History:  Diagnosis Date   Arthritis    Right knee   Atrial fibrillation (HCC)    CAD (coronary artery disease)    a. 06/07/15 NSTEMI/Cath: Dist LAD 100%, mid LAD 10%. Otw nl cors. Nl EF w/ inferoapical AK.   Essential hypertension    Hip pain    History of cardiomyopathy 04/23/2016   a. 04/2016 Echo: EF 45%, Gr2 DD; b. 11/2019 Echo: EF 45-50%, mod LVH. Gr2 DD. Nl RV fxn. Sev dil LA.    Hyperlipidemia    Hypothyroidism    NSTEMI (non-ST elevated myocardial infarction) ([place]) 06/05/2015   Vitamin D deficiency    Past Surgical History:  Procedure Laterality Date   ABDOMINAL HYSTERECTOMY     partial- one ovary remaining   BIOPSY  09/17/2020   Procedure: BIOPSY;  Surgeon: [name], DO;  [facility];  Service: Endoscopy;;   CARDIAC CATHETERIZATION N/A 06/06/2015   Procedure: Left Heart Cath and Coronary Angiography;  Surgeon: Peter M Martinique, MD;  Location: [place] [facility];  Service: Cardiovascular;  Laterality: N/A;   CARDIOVERSION N/A 09/14/2020   Procedure: CARDIOVERSION;  Surgeon: Satira Sark, MD;  [facility];  Service: Cardiovascular;  Laterality: N/A;   CARDIOVERSION N/A 06/20/2022   Procedure: CARDIOVERSION;  Surgeon: Arnoldo Lenis, MD;  Location: [facility] OR[facility];  Service: Endoscopy;  Laterality: N/A;   CHOLECYSTECTOMY     COLONOSCOPY     COLONOSCOPY N/A 08/13/2017   Procedure:  COLONOSCOPY;  Surgeon: Daneil Dolin, MD;  [facility];  Service: Endoscopy;  Laterality: N/A;  8:30 AM   COLONOSCOPY WITH PROPOFOL N/A 01/18/2021   [name]: Multiple tubular adenomas removed, next colonoscopy in 3 years   ESOPHAGOGASTRODUODENOSCOPY N/A 09/11/2016   Procedure: ESOPHAGOGASTRODUODENOSCOPY (EGD);  Surgeon: Daneil Dolin, MD;  [facility];  Service: Endoscopy;  Laterality: N/A;  7:45 am - moved to 10/11 @ 10:30   ESOPHAGOGASTRODUODENOSCOPY (EGD) WITH PROPOFOL N/A 09/17/2020   [name]: Diffuse moderate inflammation characterized by erosions and erythema found in the entire stomach.  Biopsies positive for H. pylori.  Patient has not been treated due to drug allergies.   Heel spurs Left    MALONEY DILATION N/A 09/11/2016   Procedure: Venia Minks DILATION;  Surgeon: Daneil Dolin, MD;  [facility];  Service: Endoscopy;  Laterality: N/A;   POLYPECTOMY  08/13/2017   Procedure: POLYPECTOMY;  Surgeon: Daneil Dolin, MD;  [facility];  Service: Endoscopy;;  colon   POLYPECTOMY  01/18/2021   Procedure: POLYPECTOMY;  Surgeon: Daneil Dolin, MD;  [facility];  Service: Endoscopy;;   SHOULDER ARTHROSCOPY Right    TUBAL LIGATION     Patient Active Problem List  Diagnosis Date Noted   Lumbar back pain 09/25/2022   Preventative health care 09/25/2022   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[place]) 07/01/2022   Elevated brain natriuretic peptide (BNP) level 06/19/2022   Insomnia 06/19/2022   Chest pain 06/18/2022   Hypomagnesemia 09/05/2021   Wheezing 03/06/2021   Sleep apnea 02/06/2021   Vitamin D deficiency 01/30/2021   Encounter to establish care 46/96/2952   Helicobacter pylori gastritis 11/01/2020   H/O adenomatous polyp of colon 10/31/2020   Constipation 10/15/2020   History of GI bleed 10/15/2020   Persistent atrial fibrillation ([place]) 10/10/2020   Secondary hypercoagulable state ([place]) 10/05/2020   Obesity, Class III, BMI 40-49.9 (morbid  obesity) ([place])    Gastroesophageal reflux disease    Gastritis    Acquired hypothyroidism    Melena    CHF exacerbation ([place]) 09/15/2020   Atrial fibrillation, chronic ([place]) 08/23/2020   Stable angina 01/14/2020   Abnormal nuclear stress test 12/08/2019   Microcytic anemia 11/28/2019   Mixed hyperlipidemia 11/27/2019   OA (osteoarthritis) of knee 05/18/2019   DDD (degenerative disc disease), lumbar 05/18/2019   Obesity 12/26/2017   Seasonal allergies 08/19/2016   Glucose intolerance (impaired glucose tolerance) 05/14/2016   Chronic hip pain 05/14/2016   Chronic combined systolic and diastolic heart failure ([place]) 05/14/2016   Hypothyroidism following radioiodine therapy 11/30/2015   CAD (coronary artery disease)    NSTEMI (non-ST elevated myocardial infarction) ([place])    Essential hypertension 06/05/2015    PCP: Olin Hauser, PCP  REFERRING PROVIDER: Olin Hauser, PCP  REFERRING DIAG: M54.50,G89.29 (ICD-10-CM) - Chronic bilateral low back pain without sciatica  Rationale for Evaluation and Treatment: Rehabilitation  THERAPY DIAG:  Low back pain, unspecified back pain laterality, unspecified chronicity, unspecified whether sciatica present  Other symptoms and signs involving the musculoskeletal system  Muscle weakness (generalized)  ONSET DATE: increased pain 2 months ago  SUBJECTIVE:                                                                                                                                                                                           SUBJECTIVE STATEMENT: Still having shortness of breath and fatigue from afib; she does not have any back pain at rest but back pain comes with standing, walking.    Sees Dr. Marlene Lard at the a-fib clinic on Friday 12/15  Eval: has trouble grocery shopping, cooking etc. 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  MD gave her flexeril; didn't help much.     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 No  PRECAUTIONS: None  WEIGHT BEARING RESTRICTIONS: No  FALLS:  Has patient fallen in last 6 months? No  LIVING ENVIRONMENT: Lives with: lives with their family Lives in: Mobile home Stairs: Yes: External: 5 steps; on right going up, on left going up, and can reach both Has following equipment at home: Single point cane and Wheelchair (manual)  OCCUPATION: retired  PLOF: Independent  PATIENT GOALS: walk and stand longer without pain  NEXT MD VISIT:   OBJECTIVE:   DIAGNOSTIC FINDINGS:  No recent  PATIENT SURVEYS:  FOTO 40  COGNITION: Overall cognitive status: Within functional limits for tasks assessed     SENSATION: WFL Right hip neuropathy per patient   POSTURE: rounded shoulders, forward head, decreased lumbar lordosis, and increased thoracic kyphosis  PALPATION: Tender to upper lumbar and lower thoracic spine  LUMBAR ROM:   AROM eval  Flexion 50% available  Extension 20% available  Right lateral flexion   Left lateral flexion   Right rotation   Left rotation    (Blank rows = not tested)   LOWER EXTREMITY MMT:    MMT Right eval Left eval  Hip flexion 4+ 4+  Hip extension 3- 3-  Hip abduction    Hip adduction    Hip internal rotation    Hip external rotation    Knee flexion 4 4-  Knee extension 5 5  Ankle dorsiflexion 5 5  Ankle plantarflexion    Ankle inversion    Ankle eversion     (Blank rows = not tested)   FUNCTIONAL TESTS:  5 times sit to stand: 49 sec   TODAY'S TREATMENT:                                                                                                                              DATE:  11/13/22 Supine:  LTR x 20 Decompression 5" hold x 10; head press, shoulder press, leg press, leg lengthener Abdominal bracing 5" x 10 Bridge x 10 Abdominal  bracing with march 2 x 10 each leg Hip adduction with ball 5"  2 x 10 Hip abduction with RTB 2 x 10 Abdominal bracing with SLR x 10 each Shoulder horizontal abduction RTB 2 x10    11/11/22 Supine: LTR x 20 Decompression 5" hold x 10; head press, shoulder press, leg press, leg lengthener  Bridge x 10 SLR x 10 each Abdominal set 5" hold x 10 Adominal set with march 2 x 10 Hip abduction with RTB 2 x 10 Hip adduction with ball 5" x 10        11/04/22 Review of goals, HEP and POC moving forward Supine:  Lower trunk rotation 10X10"each  Decompression 2-5 10X5" each  Bridge 10X  SLR 10X each with core stab Prone:  hamstring curls 10X each  Isometric hip extension (too weak to lift) 10X each  Heelsqueeze 10X5"  Sidelying hip abduction 10X each side     10/17/22 physical therapy evaluation and HEP     Education details: Patient educated on exam findings, POC, scope of PT, HEP. Person educated: Patient Education method: Explanation, Demonstration, and Handouts Education comprehension: verbalized understanding, returned demonstration, verbal cues required, and tactile cues required   HOME EXERCISE PROGRAM: 11/13/22 Supine horizontal shoulder abduction 2 x 10 Access Code: [email or web address] URL: [email or web address] Date: 11/11/2022 Prepared by: [facility]  Exercises - Sit to Stand  - 2 x daily - 7 x weekly - 1 sets - 10 reps - Supine Lower Trunk Rotation  - 2 x daily - 7 x weekly - 1 sets - 10 reps - Supine Scapular Retraction  - 2 x daily - 7 x weekly - 1 sets - 10 reps - Supine Bridge  - 2 x daily - 7 x weekly - 1 sets - 10 reps - Supine Active Straight Leg Raise  - 2 x daily - 7 x weekly - 1 sets - 10 reps - Supine Hip Adduction Isometric with Ball  - 2 x daily - 7 x weekly - 1 sets - 10 reps - 5 sec hold - Hooklying Clamshell with Resistance  - 2 x daily - 7 x weekly - 2 sets - 10 reps  Access Code: [email or web address] URL: [email or web address] Date:  10/17/2022 Prepared by: [facility]  Exercises - Sit to Stand  - 2 x daily - 7 x weekly - 1 sets - 10 reps - Supine Lower Trunk Rotation  - 2 x daily - 7 x weekly - 1 sets - 10 reps - Supine Scapular Retraction  - 2 x daily - 7 x weekly - 1 sets - 10 reps  ASSESSMENT:  CLINICAL IMPRESSION:  Patient continues to need cues for proper breathing during exercise.  Patient has one episode of back cramping during treatment today; needed frequent rest breaks. Noted increased height with bridging today. Added RTB shoulder horizontal abduction without issue; updated HEP.   Patient will benefit from skilled physical therapy services to address these deficits to reduce pain and improve level of function with ADLs and functional mobility tasks.   OBJECTIVE IMPAIRMENTS: Abnormal gait, cardiopulmonary status limiting activity, decreased activity tolerance, decreased endurance, decreased mobility, difficulty walking, decreased ROM, decreased strength, hypomobility, increased fascial restrictions, impaired perceived functional ability, increased muscle spasms, impaired flexibility, impaired sensation, and pain.   ACTIVITY LIMITATIONS: carrying, lifting, bending, sitting, standing, squatting, sleeping, stairs, transfers, bed mobility, continence, bathing, locomotion level, and caring for others  PARTICIPATION LIMITATIONS: meal prep, cleaning, laundry, shopping, community activity, and yard work  PERSONAL FACTORS: 1-2 comorbidities: afib, CHF  are also affecting patient's functional outcome.   REHAB POTENTIAL: Good  CLINICAL DECISION MAKING: Stable/uncomplicated  EVALUATION COMPLEXITY: Low   GOALS: Goals reviewed with patient? Yes  SHORT TERM GOALS: Target date: 11/07/2022  Patient will be independent with initial HEP Baseline: Goal status: IN PROGRESS  2.   Patient will report at least 50% improvement in overall symptoms and/or function to demonstrate improved functional mobility  Baseline:   Goal status: IN PROGRESS   LONG TERM GOALS: Target date: 11/21/2022  Patient will be independent in self management strategies to improve quality of life and functional outcomes.   Baseline:  Goal status: IN PROGRESS  2.   Patient will report at least 75% improvement in overall symptoms and/or function to demonstrate improved functional mobility  Baseline:  Goal status: IN PROGRESS  3.  Patient will increase  lower extremity MMTs to 4+-5/5 without pain to promote return to ambulation community distances with minimal deviation.  Baseline:  Goal status: IN PROGRESS  4.  Patient will improve FOTO score to predicted value  Baseline: 40 Goal status: IN PROGRESS  5.  Patient will improve 5 times sit to stand score from 49 sec to 20 sec to demonstrate improved functional mobility and increased lower extremity strength.  Baseline:  Goal status: IN PROGRESS  PLAN:  PT FREQUENCY: 2x/week  PT DURATION: 4 weeks  Interventions:Therapeutic exercises, Therapeutic activity, Neuromuscular re-education, Balance training, Gait training, Patient/Family education, Joint manipulation, Joint mobilization, Stair training, Orthotic/Fit training, DME instructions, Aquatic Therapy, Dry Needling, Electrical stimulation, Spinal manipulation, Spinal mobilization, Cryotherapy, Moist heat, Compression bandaging, scar mobilization, Splintting, Taping, Traction, Ultrasound, Ionotophoresis '4mg'$ /ml Dexamethasone, and Manual therapy  PLAN FOR NEXT SESSION: Continue to progress lumbar mobility and core and lower extremity strengthening as able.  Pt requires frequent rests due to fatigue from A-Fib.   1:47 PM, 11/13/22 Shemika Robbs Small Mell Guia MPT [facility] [place] [phone]

## 2022-11-14 ENCOUNTER — Encounter: Payer: Self-pay | Admitting: Internal Medicine

## 2022-11-14 ENCOUNTER — Ambulatory Visit (INDEPENDENT_AMBULATORY_CARE_PROVIDER_SITE_OTHER): Payer: Medicare Other | Admitting: Internal Medicine

## 2022-11-14 VITALS — BP 133/69 | HR 80 | Ht 63.0 in | Wt 217.2 lb

## 2022-11-14 DIAGNOSIS — I5042 Chronic combined systolic (congestive) and diastolic (congestive) heart failure: Secondary | ICD-10-CM

## 2022-11-14 DIAGNOSIS — E782 Mixed hyperlipidemia: Secondary | ICD-10-CM

## 2022-11-14 DIAGNOSIS — Z0001 Encounter for general adult medical examination with abnormal findings: Secondary | ICD-10-CM

## 2022-11-14 DIAGNOSIS — I2510 Atherosclerotic heart disease of native coronary artery without angina pectoris: Secondary | ICD-10-CM

## 2022-11-14 DIAGNOSIS — I482 Chronic atrial fibrillation, unspecified: Secondary | ICD-10-CM

## 2022-11-14 DIAGNOSIS — E559 Vitamin D deficiency, unspecified: Secondary | ICD-10-CM

## 2022-11-14 DIAGNOSIS — G5711 Meralgia paresthetica, right lower limb: Secondary | ICD-10-CM

## 2022-11-14 DIAGNOSIS — I10 Essential (primary) hypertension: Secondary | ICD-10-CM

## 2022-11-14 DIAGNOSIS — M545 Low back pain, unspecified: Secondary | ICD-10-CM

## 2022-11-14 DIAGNOSIS — G4733 Obstructive sleep apnea (adult) (pediatric): Secondary | ICD-10-CM

## 2022-11-14 DIAGNOSIS — E039 Hypothyroidism, unspecified: Secondary | ICD-10-CM

## 2022-11-14 DIAGNOSIS — D508 Other iron deficiency anemias: Secondary | ICD-10-CM

## 2022-11-14 NOTE — Patient Instructions (Signed)
It was a pleasure to see you today.  Thank you for giving [place] the opportunity to be involved in your care.  Below is a brief recap of your visit and next steps.  We will plan to see you again in 3 months.  Summary No medication changes today I have placed a referral to neurology We will follow up in 3 months

## 2022-11-14 NOTE — Progress Notes (Signed)
Established Patient Office Visit  Subjective   Patient ID: Maria Fry, female    DOB: 1951/06/22  Age: 71 y.o. MRN: [id number]  Chief Complaint  Patient presents with   Follow-up    Following up from previous AFIB and back therapy   Maria Fry returns to care today.  She was last seen by me on 10/19 at which time she continued to endorse feeling poorly, which she attributed to remaining in atrial fibrillation.  She also endorsed chronic lumbar back pain.  Flexeril 5 mg nightly as needed was prescribed for symptom relief.  8-week follow-up was arranged.  In the interim, she has been evaluated by cardiology and presented to the emergency department on 11/13 for upper respiratory symptoms.  There have otherwise been no acute interval events.  Today Maria Fry continues to endorse feeling poorly but states that her symptoms are improving overall.  Her back pain is improving with physical therapy.  She reports that she has follow-up in the atrial fibrillation clinic tomorrow and that they are considering A-fib ablation.  Her acute concern today is requesting a new neurology referral for management of meralgia paresthetica.  She was previously followed by Dr. Merlene Laughter, but needs to establish care with a new provider because his office is closing.   Past Medical History:  Diagnosis Date   Arthritis    Right knee   Atrial fibrillation (HCC)    CAD (coronary artery disease)    a. 06/07/15 NSTEMI/Cath: Dist LAD 100%, mid LAD 10%. Otw nl cors. Nl EF w/ inferoapical AK.   Essential hypertension    Hip pain    History of cardiomyopathy 04/23/2016   a. 04/2016 Echo: EF 45%, Gr2 DD; b. 11/2019 Echo: EF 45-50%, mod LVH. Gr2 DD. Nl RV fxn. Sev dil LA.    Hyperlipidemia    Hypothyroidism    NSTEMI (non-ST elevated myocardial infarction) ([place]) 06/05/2015   Vitamin D deficiency    Past Surgical History:  Procedure Laterality Date   ABDOMINAL HYSTERECTOMY     partial- one ovary remaining   BIOPSY   09/17/2020   Procedure: BIOPSY;  Surgeon: [name], DO;  [facility];  Service: Endoscopy;;   CARDIAC CATHETERIZATION N/A 06/06/2015   Procedure: Left Heart Cath and Coronary Angiography;  Surgeon: Peter M Martinique, MD;  Location: [place] [facility];  Service: Cardiovascular;  Laterality: N/A;   CARDIOVERSION N/A 09/14/2020   Procedure: CARDIOVERSION;  Surgeon: Satira Sark, MD;  [facility];  Service: Cardiovascular;  Laterality: N/A;   CARDIOVERSION N/A 06/20/2022   Procedure: CARDIOVERSION;  Surgeon: Arnoldo Lenis, MD;  Location[facility];  Service: Endoscopy;  Laterality: N/A;   CHOLECYSTECTOMY     COLONOSCOPY     COLONOSCOPY N/A 08/13/2017   Procedure: COLONOSCOPY;  Surgeon: Daneil Dolin, MD;  [facility];  Service: Endoscopy;  Laterality: N/A;  8:30 AM   COLONOSCOPY WITH PROPOFOL N/A 01/18/2021   [name]: Multiple tubular adenomas removed, next colonoscopy in 3 years   ESOPHAGOGASTRODUODENOSCOPY N/A 09/11/2016   Procedure: ESOPHAGOGASTRODUODENOSCOPY (EGD);  Surgeon: Daneil Dolin, MD;  [facility];  Service: Endoscopy;  Laterality: N/A;  7:45 am - moved to 10/11 @ 10:30   ESOPHAGOGASTRODUODENOSCOPY (EGD) WITH PROPOFOL N/A 09/17/2020   [name]: Diffuse moderate inflammation characterized by erosions and erythema found in the entire stomach.  Biopsies positive for H. pylori.  Patient has not been treated due to drug allergies.   Heel spurs  Left    MALONEY DILATION N/A 09/11/2016   Procedure: Venia Minks DILATION;  Surgeon: Daneil Dolin, MD;  [facility];  Service: Endoscopy;  Laterality: N/A;   POLYPECTOMY  08/13/2017   Procedure: POLYPECTOMY;  Surgeon: Daneil Dolin, MD;  [facility];  Service: Endoscopy;;  colon   POLYPECTOMY  01/18/2021   Procedure: POLYPECTOMY;  Surgeon: Daneil Dolin, MD;  [facility];  Service: Endoscopy;;   SHOULDER ARTHROSCOPY Right    TUBAL LIGATION     Social  History   Tobacco Use   Smoking status: Former    Packs/day: 1.00    Years: 20.00    Total pack years: 20.00    Types: Cigarettes    Start date: 12/02/1977    Quit date: 12/02/2002    Years since quitting: 19.9   Smokeless tobacco: Never   Tobacco comments:    Former smoker 05/10/22  Vaping Use   Vaping Use: Never used  Substance Use Topics   Alcohol use: No    Alcohol/week: 0.0 standard drinks of alcohol   Drug use: No   Family History  Problem Relation Age of Onset   Heart failure Father        Deceased   Heart attack Father        Deceased   Stroke Sister        Deceased   Heart failure Brother        Deceased   Cancer Brother        Deceased   Heart attack Brother        Deceased   COPD Daughter    Arthritis Daughter    Lupus Daughter    Colon cancer Neg Hx    Allergies  Allergen Reactions   Afrin [Oxymetazoline] Anaphylaxis   Amoxicillin Anaphylaxis   Lisinopril Anaphylaxis   Ranolazine Anaphylaxis    Throat swells, Dizzy, Reaction occurred with generic med   Vicodin [Hydrocodone-Acetaminophen] Diarrhea and Nausea And Vomiting   Demerol [Meperidine] Nausea And Vomiting   Flonase [Fluticasone]     anaphylaxis   Lodine [Etodolac] Rash   Review of Systems  Constitutional:  Positive for malaise/fatigue.  Musculoskeletal:  Positive for back pain (chronic lumbar back pain).      Objective:     BP 133/69 (BP Location: Right Arm, Patient Position: Sitting, Cuff Size: Large)   Pulse 80   Ht '5\' 3"'$  (1.6 m)   Wt 217 lb 3.2 oz (98.5 kg)   SpO2 97%   BMI 38.48 kg/m  BP Readings from Last 3 Encounters:  11/15/22 (!) 150/92  11/14/22 133/69  10/14/22 127/87   Physical Exam Vitals reviewed.  Constitutional:      General: She is not in acute distress.    Appearance: Normal appearance. She is obese. She is not toxic-appearing.  HENT:     Head: Normocephalic and atraumatic.     Right Ear: External ear normal.     Left Ear: External ear normal.     Nose:  Nose normal. No congestion or rhinorrhea.     Mouth/Throat:     Mouth: Mucous membranes are moist.     Pharynx: Oropharynx is clear. No oropharyngeal exudate or posterior oropharyngeal erythema.  Eyes:     General: No scleral icterus.    Extraocular Movements: Extraocular movements intact.     Conjunctiva/sclera: Conjunctivae normal.     Pupils: Pupils are equal, round, and reactive to light.  Cardiovascular:  Rate and Rhythm: Normal rate and regular rhythm.     Pulses: Normal pulses.     Heart sounds: Normal heart sounds. No murmur heard.    No friction rub. No gallop.  Pulmonary:     Effort: Pulmonary effort is normal.     Breath sounds: Normal breath sounds. No wheezing, rhonchi or rales.  Abdominal:     General: Abdomen is flat. Bowel sounds are normal. There is no distension.     Palpations: Abdomen is soft.     Tenderness: There is no abdominal tenderness.  Musculoskeletal:        General: No swelling. Normal range of motion.     Cervical back: Normal range of motion.     Right lower leg: No edema.     Left lower leg: No edema.  Lymphadenopathy:     Cervical: No cervical adenopathy.  Skin:    General: Skin is warm and dry.     Capillary Refill: Capillary refill takes less than 2 seconds.     Coloration: Skin is not jaundiced.  Neurological:     General: No focal deficit present.     Mental Status: She is alert and oriented to person, place, and time.  Psychiatric:        Mood and Affect: Mood normal.        Behavior: Behavior normal.    Last CBC Lab Results  Component Value Date   WBC 7.9 09/05/2022   HGB 9.4 (L) 09/05/2022   HCT 30.6 (L) 09/05/2022   MCV 79.9 (L) 09/05/2022   MCH 24.5 (L) 09/05/2022   RDW 23.8 (H) 09/05/2022   PLT 378 64/40/3474   Last metabolic panel Lab Results  Component Value Date   GLUCOSE 97 11/15/2022   NA 136 11/15/2022   K 4.4 11/15/2022   CL 105 11/15/2022   CO2 27 11/15/2022   BUN 15 11/15/2022   CREATININE 0.92  11/15/2022   GFRNONAA >60 11/15/2022   CALCIUM 8.5 (L) 11/15/2022   PHOS 3.1 06/19/2022   PROT 7.4 07/17/2022   ALBUMIN 3.7 (L) 07/17/2022   LABGLOB 3.7 07/17/2022   AGRATIO 1.0 (L) 07/17/2022   BILITOT 0.4 07/17/2022   ALKPHOS 104 07/17/2022   AST 15 07/17/2022   ALT 11 07/17/2022   ANIONGAP 4 (L) 11/15/2022   Last lipids Lab Results  Component Value Date   CHOL 123 03/06/2022   HDL 48 03/06/2022   LDLCALC 63 03/06/2022   TRIG 51 03/06/2022   CHOLHDL 2.6 03/06/2022   Last hemoglobin A1c Lab Results  Component Value Date   HGBA1C 5.5 01/03/2020   Last thyroid functions Lab Results  Component Value Date   TSH 2.120 07/17/2022   Last vitamin D Lab Results  Component Value Date   VD25OH 22.7 (L) 03/06/2022   Last vitamin B12 and Folate Lab Results  Component Value Date   VITAMINB12 403 08/23/2020     Assessment & Plan:   Problem List Items Addressed This Visit       Essential hypertension    She is currently prescribed amlodipine, spironolactone, and metoprolol for treatment of hypertension.  No medication changes today.      CAD (coronary artery disease)    Asymptomatic currently.  Denies recent chest pain.  Continue metoprolol and statin.      Chronic combined systolic and diastolic heart failure (HCC)    Euvolemic on exam today.  Continue Lasix 40 mg daily      Atrial fibrillation, chronic (HCC)  History of persistent atrial fibrillation, previously requiring DCCV.  She is currently prescribed Tikosyn, metoprolol, and Eliquis.  She has follow-up in atrial fibrillation clinic tomorrow, where they are considering A-fib ablation.      Acquired hypothyroidism    TSH WNL when last checked in August.  Continue levothyroxine at current dose.      Meralgia paresthetica - Primary    She endorses a history of meralgia paresthetica, treated with gabapentin 300 mg twice daily.  She was previously followed by Dr. Merlene Laughter, but needs a new neurology referral  today as his practice will be closing. -Neurology referral placed today      Mixed hyperlipidemia    Currently prescribed atorvastatin 80 mg daily.  Lipid panel updated earlier this year and was well within goal.      Iron deficiency anemia    Continue iron supplementation      Vitamin D insufficiency    Continue daily vitamin D supplementation      Lumbar back pain    Chronic issue.  Today she states that her pain is gradually improving with physical therapy.  She continues to take Flexeril 5 mg as needed at night.      Encounter for general adult medical examination with abnormal findings    Returning for follow-up today -Needs to reschedule previously ordered mammogram -Follow-up in 3 months and plan to repeat labs at that time      Return in about 3 months (around 02/13/2023).    Johnette Abraham, MD

## 2022-11-15 ENCOUNTER — Ambulatory Visit (HOSPITAL_COMMUNITY)
Admission: RE | Admit: 2022-11-15 | Discharge: 2022-11-15 | Disposition: A | Discharge status: Home / Self Care | Payer: Medicare Other | Source: Ambulatory Visit | Attending: Physician Assistant | Admitting: Physician Assistant

## 2022-11-15 VITALS — BP 150/92 | HR 69 | Ht 63.0 in | Wt 216.4 lb

## 2022-11-15 DIAGNOSIS — I5042 Chronic combined systolic (congestive) and diastolic (congestive) heart failure: Secondary | ICD-10-CM | POA: Insufficient documentation

## 2022-11-15 DIAGNOSIS — E669 Obesity, unspecified: Secondary | ICD-10-CM | POA: Diagnosis not present

## 2022-11-15 DIAGNOSIS — I2510 Atherosclerotic heart disease of native coronary artery without angina pectoris: Secondary | ICD-10-CM | POA: Diagnosis not present

## 2022-11-15 DIAGNOSIS — I480 Paroxysmal atrial fibrillation: Secondary | ICD-10-CM

## 2022-11-15 DIAGNOSIS — D6869 Other thrombophilia: Secondary | ICD-10-CM | POA: Diagnosis not present

## 2022-11-15 DIAGNOSIS — I110 Hypertensive heart disease with heart failure: Secondary | ICD-10-CM | POA: Insufficient documentation

## 2022-11-15 DIAGNOSIS — I4819 Other persistent atrial fibrillation: Secondary | ICD-10-CM | POA: Diagnosis present

## 2022-11-15 DIAGNOSIS — Z7901 Long term (current) use of anticoagulants: Secondary | ICD-10-CM | POA: Diagnosis not present

## 2022-11-15 DIAGNOSIS — E785 Hyperlipidemia, unspecified: Secondary | ICD-10-CM | POA: Insufficient documentation

## 2022-11-15 DIAGNOSIS — Z6838 Body mass index (BMI) 38.0-38.9, adult: Secondary | ICD-10-CM | POA: Insufficient documentation

## 2022-11-15 DIAGNOSIS — G4733 Obstructive sleep apnea (adult) (pediatric): Secondary | ICD-10-CM | POA: Diagnosis not present

## 2022-11-15 LAB — BASIC METABOLIC PANEL
Anion gap: 4 — ABNORMAL LOW (ref 5–15)
BUN: 15 mg/dL (ref 8–23)
CO2: 27 mmol/L (ref 22–32)
Calcium: 8.5 mg/dL — ABNORMAL LOW (ref 8.9–10.3)
Chloride: 105 mmol/L (ref 98–111)
Creatinine, Ser: 0.92 mg/dL (ref 0.44–1.00)
GFR, Estimated: >60 mL/min (ref >60)
Glucose, Bld: 97 mg/dL (ref 70–99)
Potassium: 4.4 mmol/L (ref 3.5–5.1)
Sodium: 136 mmol/L (ref 135–145)

## 2022-11-15 LAB — MAGNESIUM: Magnesium: 2.2 mg/dL (ref 1.7–2.4)

## 2022-11-15 NOTE — Progress Notes (Signed)
Primary Care Physician: Johnette Abraham, MD Primary Cardiologist: Dr Domenic Polite  Primary Electrophysiologist: Dr Rayann Heman Referring Physician: Levell July   Maria Fry is a 71 y.o. female with a history of CAD, chronic combined systolic and diastolic CHF, HLD, HTN, OSA, hypothyroidism, and persistent atrial fibrillation who presents for follow up in the [facility]. The patient was initially diagnosed with atrial fibrillation 08/18/20 at a routine follow up with Rosaria Ferries. She was having symptoms of increased fatigue. She was started on Eliquis for a CHADS2VASC score of 5. She underwent DCCV on 09/14/20. She was hospitalized 10/15-10/17/21 for acute CHF and black stools. Workup revealed gastritis and her Eliquis was temporarily held. She resumed 09/17/20. Her ASA was discontinued and she has not had recurrence of bleeding. She was back in afib on follow up 10/02/20. She reports that she did feel better in SR with more energy and less SOB.  Pt is s/p dofetilide loading 11/9-11/14/21. She converted to sinus rhythm after the first dose however, she was noted to have significant widening of QTC out to 551 ms. Dofetilide was held for 24 hours and subsequently resumed at 125 mcg twice daily after QT normalized.    Patient presented to the ED 06/18/22 with chest pain. She was found to be in afib and underwent DCCV on 06/20/22. She was sent to the ED 09/05/22 for lightheadedness and fatigue, found to be in rate controlled afib. She did not undergo DCCV at that time. She wore a preventice event monitor which showed only two possible episodes of afib, low burden.   On follow up today, patient reports that she has been having much more frequent episodes of afib over the past month. She is in rate controlled afib today. She is symptomatic with fatigue on exertion. She does admit that she has not been using her CPAP consistently due to mask leakage issues.   Today, she denies  symptoms of chest pain, orthopnea, PND, dizziness, presyncope, syncope, bleeding, or neurologic sequela. The patient is tolerating medications without difficulties and is otherwise without complaint today.    Atrial Fibrillation Risk Factors:  she does have symptoms or diagnosis of sleep apnea. Patient reports intermittent compliance with CPAP. she does not have a history of rheumatic fever.   she has a BMI of Body mass index is 38.33 kg/m.Marland Kitchen Filed Weights   11/15/22 0913  Weight: 98.2 kg    Family History  Problem Relation Age of Onset   Heart failure Father        Deceased   Heart attack Father        Deceased   Stroke Sister        Deceased   Heart failure Brother        Deceased   Cancer Brother        Deceased   Heart attack Brother        Deceased   COPD Daughter    Arthritis Daughter    Lupus Daughter    Colon cancer Neg Hx      Atrial Fibrillation Management history:  Previous antiarrhythmic drugs: dofetilide  Previous cardioversions: 09/14/20, 06/20/22 Previous ablations: none CHADS2VASC score: 5 Anticoagulation history: Eliquis   Past Medical History:  Diagnosis Date   Arthritis    Right knee   Atrial fibrillation (HCC)    CAD (coronary artery disease)    a. 06/07/15 NSTEMI/Cath: Dist LAD 100%, mid LAD 10%. Otw nl cors. Nl EF w/ inferoapical AK.  Essential hypertension    Hip pain    History of cardiomyopathy 04/23/2016   a. 04/2016 Echo: EF 45%, Gr2 DD; b. 11/2019 Echo: EF 45-50%, mod LVH. Gr2 DD. Nl RV fxn. Sev dil LA.    Hyperlipidemia    Hypothyroidism    NSTEMI (non-ST elevated myocardial infarction) ([place]) 06/05/2015   Vitamin D deficiency    Past Surgical History:  Procedure Laterality Date   ABDOMINAL HYSTERECTOMY     partial- one ovary remaining   BIOPSY  09/17/2020   Procedure: BIOPSY;  Surgeon: [name], DO;  [facility]: [facility];  Service: Endoscopy;;   CARDIAC CATHETERIZATION N/A 06/06/2015   Procedure: Left Heart Cath  and Coronary Angiography;  Surgeon: Peter M Martinique, MD;  Location: [facility];  Service: Cardiovascular;  Laterality: N/A;   CARDIOVERSION N/A 09/14/2020   Procedure: CARDIOVERSION;  Surgeon: Satira Sark, MD;  Location: [facility];  Service: Cardiovascular;  Laterality: N/A;   CARDIOVERSION N/A 06/20/2022   Procedure: CARDIOVERSION;  Surgeon: Arnoldo Lenis, MD;  Location: [facility];  Service: Endoscopy;  Laterality: N/A;   CHOLECYSTECTOMY     COLONOSCOPY     COLONOSCOPY N/A 08/13/2017   Procedure: COLONOSCOPY;  Surgeon: Daneil Dolin, MD;  [facility]: [facility];  Service: Endoscopy;  Laterality: N/A;  8:30 AM   COLONOSCOPY WITH PROPOFOL N/A 01/18/2021   [name]: Multiple tubular adenomas removed, next colonoscopy in 3 years   ESOPHAGOGASTRODUODENOSCOPY N/A 09/11/2016   Procedure: ESOPHAGOGASTRODUODENOSCOPY (EGD);  Surgeon: Daneil Dolin, MD;  [facility];  Service: Endoscopy;  Laterality: N/A;  7:45 am - moved to 10/11 @ 10:30   ESOPHAGOGASTRODUODENOSCOPY (EGD) WITH PROPOFOL N/A 09/17/2020   [name]: Diffuse moderate inflammation characterized by erosions and erythema found in the entire stomach.  Biopsies positive for H. pylori.  Patient has not been treated due to drug allergies.   Heel spurs Left    MALONEY DILATION N/A 09/11/2016   Procedure: Venia Minks DILATION;  Surgeon: Daneil Dolin, MD;  [facility];  Service: Endoscopy;  Laterality: N/A;   POLYPECTOMY  08/13/2017   Procedure: POLYPECTOMY;  Surgeon: Daneil Dolin, MD;  [facility];  Service: Endoscopy;;  colon   POLYPECTOMY  01/18/2021   Procedure: POLYPECTOMY;  Surgeon: Daneil Dolin, MD;  [facility];  Service: Endoscopy;;   SHOULDER ARTHROSCOPY Right    TUBAL LIGATION      Current Outpatient Medications  Medication Sig Dispense Refill   acetaminophen (TYLENOL) 500 MG tablet Take 500 mg by mouth every 6 (six) hours as needed for mild pain or moderate pain.      amLODipine (NORVASC) 2.5 MG tablet Take 1 tablet (2.5 mg total) by mouth daily. 90 tablet 3   atorvastatin (LIPITOR) 80 MG tablet Take 1 tablet (80 mg total) by mouth at bedtime. 90 tablet 3   azelastine (ASTELIN) 0.1 % nasal spray Place 1 spray into both nostrils 2 (two) times daily. Use in each nostril as directed 30 mL 0   calcium carbonate (OS-CAL - DOSED IN MG OF ELEMENTAL CALCIUM) 1250 (500 Ca) MG tablet Take 500 mg by mouth daily.     Cholecalciferol (VITAMIN D3) 50 MCG (2000 UT) capsule Take 1 capsule (2,000 Units total) by mouth daily. 30 capsule 3   cyclobenzaprine (FLEXERIL) 5 MG tablet Take 1 tablet (5 mg total) by mouth 3 (three) times daily as needed for muscle spasms. 30 tablet 1   diclofenac  Sodium (VOLTAREN) 1 % GEL Apply 1 application topically 4 (four) times daily as needed (pain).     dofetilide (TIKOSYN) 125 MCG capsule Take 1 capsule by mouth twice daily 180 capsule 1   ELIQUIS 5 MG TABS tablet Take 1 tablet by mouth twice daily 60 tablet 3   ferrous sulfate 325 (65 FE) MG EC tablet Take 1 tablet (325 mg total) by mouth daily. 60 tablet 1   furosemide (LASIX) 40 MG tablet Take 1 tablet (40 mg total) by mouth daily.     gabapentin (NEURONTIN) 300 MG capsule Take 1 capsule (300 mg total) by mouth 2 (two) times daily. 60 capsule 2   levothyroxine (SYNTHROID) 100 MCG tablet Take 1 tablet (100 mcg total) by mouth daily before breakfast. 90 tablet 1   loratadine (CLARITIN) 10 MG tablet Take 10 mg by mouth daily.     magnesium oxide (MAG-OX) 400 MG tablet Take 0.5 tablets (200 mg total) by mouth daily. 30 tablet 6   melatonin 3 MG TABS tablet Take 3 mg by mouth at bedtime.     metoprolol tartrate (LOPRESSOR) 25 MG tablet Take 0.5 tablets (12.5 mg total) by mouth 2 (two) times daily. 45 tablet 3   nitroGLYCERIN (NITROSTAT) 0.4 MG SL tablet DISSOLVE ONE TABLET UNDER THE TONGUE EVERY 5 MINUTES AS NEEDED FOR CHEST PAIN.  DO NOT EXCEED A TOTAL OF 3 DOSES IN 15 MINUTES (Patient taking  differently: DISSOLVE ONE TABLET UNDER THE TONGUE EVERY 5 MINUTES AS NEEDED FOR CHEST PAIN.  DO NOT EXCEED A TOTAL OF 3 DOSES IN 15 MINUTES) 25 tablet 3   ondansetron (ZOFRAN-ODT) 4 MG disintegrating tablet Take 1 tablet (4 mg total) by mouth every 8 (eight) hours as needed for nausea or vomiting. 20 tablet 0   potassium chloride SA (KLOR-CON M) 20 MEQ tablet Take 2 tablets (40 mEq total) by mouth daily.     spironolactone (ALDACTONE) 25 MG tablet Take 1/2 (one-half) tablet by mouth once daily 45 tablet 2   vitamin C (ASCORBIC ACID) 500 MG tablet Take 500 mg by mouth daily.     No current facility-administered medications for this encounter.    Allergies  Allergen Reactions   Afrin [Oxymetazoline] Anaphylaxis   Amoxicillin Anaphylaxis   Lisinopril Anaphylaxis   Ranolazine Anaphylaxis    Throat swells, Dizzy, Reaction occurred with generic med   Vicodin [Hydrocodone-Acetaminophen] Diarrhea and Nausea And Vomiting   Demerol [Meperidine] Nausea And Vomiting   Flonase [Fluticasone]     anaphylaxis   Lodine [Etodolac] Rash    Social History   Socioeconomic History   Marital status: Single    Spouse name: Not on file   Number of children: 3   Years of education: 10   Highest education level: Not on file  Occupational History   Occupation: team leader at [employer]  Tobacco Use   Smoking status: Former    Packs/day: 1.00    Years: 20.00    Total pack years: 20.00    Types: Cigarettes    Start date: 12/02/1977    Quit date: 12/02/2002    Years since quitting: 19.9   Smokeless tobacco: Never   Tobacco comments:    Former smoker 05/10/22  Vaping Use   Vaping Use: Never used  Substance and Sexual Activity   Alcohol use: No    Alcohol/week: 0.0 standard drinks of alcohol   Drug use: No   Sexual activity: Not Currently  Other Topics Concern   Not on file  Social History Narrative   Retired from [employer].   Social Determinants of Health   Financial Resource Strain:  Low Risk  (11/28/2021)   Overall Financial Resource Strain (CARDIA)    Difficulty of Paying Living Expenses: Not hard at all  Food Insecurity: No Food Insecurity (11/28/2021)   Hunger Vital Sign    Worried About Running Out of Food in the Last Year: Never true    Ran Out of Food in the Last Year: Never true  Transportation Needs: No Transportation Needs (11/28/2021)   PRAPARE - Hydrologist (Medical): No    Lack of Transportation (Non-Medical): No  Physical Activity: Insufficiently Active (11/28/2021)   Exercise Vital Sign    Days of Exercise per Week: 5 days    Minutes of Exercise per Session: 10 min  Stress: No Stress Concern Present (11/28/2021)   [place]    Feeling of Stress : Not at all  Social Connections: Moderately Integrated (11/28/2021)   Social Connection and Isolation Panel [NHANES]    Frequency of Communication with Friends and Family: More than three times a week    Frequency of Social Gatherings with Friends and Family: More than three times a week    Attends Religious Services: More than 4 times per year    Active Member of [employer] or Organizations: No    Attends Archivist Meetings: Never    Marital Status: Married  Human resources officer Violence: Not At Risk (11/28/2021)   Humiliation, Afraid, Rape, and Kick questionnaire    Fear of Current or Ex-Partner: No    Emotionally Abused: No    Physically Abused: No    Sexually Abused: No     ROS- All systems are reviewed and negative except as per the HPI above.  Physical Exam: Vitals:   11/15/22 0913  BP: (!) 150/92  Pulse: 69  Weight: 98.2 kg  Height: '5\' 3"'$  (1.6 m)    GEN- The patient is a well appearing obese female, alert and oriented x 3 today.   HEENT-head normocephalic, atraumatic, sclera clear, conjunctiva pink, hearing intact, trachea midline. Lungs- Clear to ausculation bilaterally, normal work of  breathing Heart- irregular rate and rhythm, no murmurs, rubs or gallops  GI- soft, NT, ND, + BS Extremities- no clubbing, cyanosis, or edema MS- no significant deformity or atrophy Skin- no rash or lesion Psych- euthymic mood, full affect Neuro- strength and sensation are intact   Wt Readings from Last 3 Encounters:  11/15/22 98.2 kg  11/14/22 98.5 kg  10/10/22 97.3 kg    EKG today demonstrates  Afib Vent. rate 69 BPM PR interval * ms QRS duration 96 ms QT/QTcB 374/400 ms  Echo 06/19/22 demonstrated   1. Left ventricular ejection fraction, by estimation, is 55%. The left  ventricle has normal function. Left ventricular endocardial border not  optimally defined to evaluate regional wall motion. There is severe left  ventricular hypertrophy.   2. Right ventricular systolic function is normal. The right ventricular  size is normal.   3. The aortic valve is tricuspid. There is mild calcification of the  aortic valve. There is mild thickening of the aortic valve.   4. IVC is small suggesting low RA pressure and hypovolemia.   5. Limited echo to evaluate LV function   Epic records are reviewed at length today  CHA2DS2-VASc Score = 5  The patient's score is based upon: CHF History: 1 HTN History: 1 Diabetes  History: 0 Stroke History: 0 Vascular Disease History: 1 Age Score: 1 Gender Score: 1        ASSESSMENT AND PLAN: 1. Persistent Atrial Fibrillation (ICD10:  I48.19) The patient's CHA2DS2-VASc score is 5, indicating a 7.2% annual risk of stroke.   S/p dofetilide loading 11/9-11/14/21 Unfortunately, she has been having more frequent symptomatic afib. We discussed rhythm control options including amiodarone and ablation. Would favor ablation given her younger age. However, she does have severe biatrial enlargement. She is agreeable to consultation with EP to discuss ablation candidacy. Continue dofetilide 125 mcg BID Check bmet/mag today.  Continue Eliquis 5 mg  BID Continue Lopressor 25 mg BID  2. Secondary Hypercoagulable State (ICD10:  D68.69) The patient is at significant risk for stroke/thromboembolism based upon her CHA2DS2-VASc Score of 5.  Continue Apixaban (Eliquis).   3. Obesity Body mass index is 38.33 kg/m. Lifestyle modification was discussed and encouraged including regular physical activity and weight reduction.  4. OSA Encouraged compliance with CPAP therapy. Patient plans to reach out to Dr Halford Chessman to discuss mask leakage issues as well as alternative treatments for her OSA.   5. Chronic combined CHF Fluid status appears stable today.  6. CAD No anginal symptoms.  7. HTN Mildly elevated today, she has not taken her morning medications yet.    Follow up with EP to discuss ablation.    [facility] [address] [place], [place] [address] [phone]

## 2022-11-19 ENCOUNTER — Ambulatory Visit (HOSPITAL_COMMUNITY): Payer: Medicare Other | Admitting: Physical Therapy

## 2022-11-19 DIAGNOSIS — M545 Low back pain, unspecified: Secondary | ICD-10-CM | POA: Diagnosis not present

## 2022-11-19 DIAGNOSIS — M6281 Muscle weakness (generalized): Secondary | ICD-10-CM

## 2022-11-19 DIAGNOSIS — R29898 Other symptoms and signs involving the musculoskeletal system: Secondary | ICD-10-CM

## 2022-11-19 NOTE — Therapy (Signed)
OUTPATIENT PHYSICAL THERAPY TREATMENT Patient Name: Maria Fry MRN: [id number] DOB:04-23-1951, 71 y.o., female Today's Date: 11/19/2022   PT End of Session - 11/19/22 1431     Visit Number 5    Number of Visits 8    Date for PT Re-Evaluation 11/21/22    Authorization Type [employer] Medicare    Authorization Time Period no VL, 20% co insurance, no auth, no copay    Progress Note Due on Visit 10    PT Start Time 1430    PT Stop Time 1510    PT Time Calculation (min) 40 min    Behavior During Therapy [facility] for tasks assessed/performed             Past Medical History:  Diagnosis Date   Arthritis    Right knee   Atrial fibrillation (HCC)    CAD (coronary artery disease)    a. 06/07/15 NSTEMI/Cath: Dist LAD 100%, mid LAD 10%. Otw nl cors. Nl EF w/ inferoapical AK.   Essential hypertension    Hip pain    History of cardiomyopathy 04/23/2016   a. 04/2016 Echo: EF 45%, Gr2 DD; b. 11/2019 Echo: EF 45-50%, mod LVH. Gr2 DD. Nl RV fxn. Sev dil LA.    Hyperlipidemia    Hypothyroidism    NSTEMI (non-ST elevated myocardial infarction) ([place]) 06/05/2015   Vitamin D deficiency    Past Surgical History:  Procedure Laterality Date   ABDOMINAL HYSTERECTOMY     partial- one ovary remaining   BIOPSY  09/17/2020   Procedure: BIOPSY;  Surgeon: [name], DO;  [facility];  Service: Endoscopy;;   CARDIAC CATHETERIZATION N/A 06/06/2015   Procedure: Left Heart Cath and Coronary Angiography;  Surgeon: Peter M Martinique, MD;  Location: [facility];  Service: Cardiovascular;  Laterality: N/A;   CARDIOVERSION N/A 09/14/2020   Procedure: CARDIOVERSION;  Surgeon: Satira Sark, MD;  [facility];  Service: Cardiovascular;  Laterality: N/A;   CARDIOVERSION N/A 06/20/2022   Procedure: CARDIOVERSION;  Surgeon: Arnoldo Lenis, MD;  Location: [facility];  Service: Endoscopy;  Laterality: N/A;   CHOLECYSTECTOMY     COLONOSCOPY     COLONOSCOPY N/A 08/13/2017   Procedure:  COLONOSCOPY;  Surgeon: Daneil Dolin, MD;  [facility];  Service: Endoscopy;  Laterality: N/A;  8:30 AM   COLONOSCOPY WITH PROPOFOL N/A 01/18/2021   [name]: Multiple tubular adenomas removed, next colonoscopy in 3 years   ESOPHAGOGASTRODUODENOSCOPY N/A 09/11/2016   Procedure: ESOPHAGOGASTRODUODENOSCOPY (EGD);  Surgeon: Daneil Dolin, MD;  [facility];  Service: Endoscopy;  Laterality: N/A;  7:45 am - moved to 10/11 @ 10:30   ESOPHAGOGASTRODUODENOSCOPY (EGD) WITH PROPOFOL N/A 09/17/2020   [name]: Diffuse moderate inflammation characterized by erosions and erythema found in the entire stomach.  Biopsies positive for H. pylori.  Patient has not been treated due to drug allergies.   Heel spurs Left    MALONEY DILATION N/A 09/11/2016   Procedure: Venia Minks DILATION;  Surgeon: Daneil Dolin, MD;  [facility];  Service: Endoscopy;  Laterality: N/A;   POLYPECTOMY  08/13/2017   Procedure: POLYPECTOMY;  Surgeon: Daneil Dolin, MD;  [facility];  Service: Endoscopy;;  colon   POLYPECTOMY  01/18/2021   Procedure: POLYPECTOMY;  Surgeon: Daneil Dolin, MD;  [facility];  Service: Endoscopy;;   SHOULDER ARTHROSCOPY Right    TUBAL LIGATION     Patient Active Problem List  Diagnosis Date Noted   Lumbar back pain 09/25/2022   Preventative health care 09/25/2022   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[place]) 07/01/2022   Elevated brain natriuretic peptide (BNP) level 06/19/2022   Insomnia 06/19/2022   Chest pain 06/18/2022   Hypomagnesemia 09/05/2021   Wheezing 03/06/2021   Sleep apnea 02/06/2021   Vitamin D deficiency 01/30/2021   Encounter to establish care 09/81/1914   Helicobacter pylori gastritis 11/01/2020   H/O adenomatous polyp of colon 10/31/2020   Constipation 10/15/2020   History of GI bleed 10/15/2020   Persistent atrial fibrillation ([place]) 10/10/2020   Secondary hypercoagulable state ([place]) 10/05/2020   Obesity, Class III, BMI 40-49.9 (morbid  obesity) ([place])    Gastroesophageal reflux disease    Gastritis    Acquired hypothyroidism    Melena    CHF exacerbation ([place]) 09/15/2020   Atrial fibrillation, chronic ([place]) 08/23/2020   Stable angina 01/14/2020   Abnormal nuclear stress test 12/08/2019   Microcytic anemia 11/28/2019   Mixed hyperlipidemia 11/27/2019   OA (osteoarthritis) of knee 05/18/2019   DDD (degenerative disc disease), lumbar 05/18/2019   Obesity 12/26/2017   Seasonal allergies 08/19/2016   Glucose intolerance (impaired glucose tolerance) 05/14/2016   Chronic hip pain 05/14/2016   Chronic combined systolic and diastolic heart failure ([place]) 05/14/2016   Hypothyroidism following radioiodine therapy 11/30/2015   CAD (coronary artery disease)    NSTEMI (non-ST elevated myocardial infarction) ([place])    Essential hypertension 06/05/2015    PCP: Olin Hauser, PCP  REFERRING PROVIDER: Olin Hauser, PCP  REFERRING DIAG: M54.50,G89.29 (ICD-10-CM) - Chronic bilateral low back pain without sciatica  Rationale for Evaluation and Treatment: Rehabilitation  THERAPY DIAG:  Low back pain, unspecified back pain laterality, unspecified chronicity, unspecified whether sciatica present  Other symptoms and signs involving the musculoskeletal system  Muscle weakness (generalized)  ONSET DATE: increased pain 2 months ago  SUBJECTIVE:                                                                                                                                                                                           SUBJECTIVE STATEMENT: Pt went to Afib MD and is planning to have an ablation done.  Returns to MD Jan 11 to set a date.  Pt reports she feels better than she did at last visit, however comes and goes.  No back pain at rest but back pain comes with standing, walking.    States she's been using the heating pad at home too and that helps a lot.  Eval: has trouble grocery shopping, cooking etc. Very stiff in the  mornings; most of pain is low back and to  the left side; some neuropathy in right hip.  MD gave her flexeril; didn't help much.    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 No  PRECAUTIONS: None  WEIGHT BEARING RESTRICTIONS: No  FALLS:  Has patient fallen in last 6 months? No  LIVING ENVIRONMENT: Lives with: lives with their family Lives in: Mobile home Stairs: Yes: External: 5 steps; on right going up, on left going up, and can reach both Has following equipment at home: Single point cane and Wheelchair (manual)  OCCUPATION: retired  PLOF: Independent  PATIENT GOALS: walk and stand longer without pain  NEXT MD VISIT:   OBJECTIVE:   DIAGNOSTIC FINDINGS:  No recent  PATIENT SURVEYS:  FOTO 40  COGNITION: Overall cognitive status: Within functional limits for tasks assessed     SENSATION: WFL Right hip neuropathy per patient   POSTURE: rounded shoulders, forward head, decreased lumbar lordosis, and increased thoracic kyphosis  PALPATION: Tender to upper lumbar and lower thoracic spine  LUMBAR ROM:   AROM eval  Flexion 50% available  Extension 20% available  Right lateral flexion   Left lateral flexion   Right rotation   Left rotation    (Blank rows = not tested)   LOWER EXTREMITY MMT:    MMT Right eval Left eval  Hip flexion 4+ 4+  Hip extension 3- 3-  Hip abduction    Hip adduction    Hip internal rotation    Hip external rotation    Knee flexion 4 4-  Knee extension 5 5  Ankle dorsiflexion 5 5  Ankle plantarflexion    Ankle inversion    Ankle eversion     (Blank rows = not tested)   FUNCTIONAL TESTS:  5 times sit to stand: 49 sec   TODAY'S TREATMENT:                                                                                                                              DATE:  11/19/22 Supine:  Decompression 1-5  5" holds 10 reps  Decompression with Red thera band 1-4 10X  Lower trunk rotations 20X  Bridge 10X2 Abdominal bracing with march 2 x 10 each leg SLR with abdominal bracing 2X10 each  11/13/22 Supine:  LTR x 20 Decompression 5" hold x 10; head press, shoulder press, leg press, leg lengthener Abdominal bracing 5" x 10 Bridge x 10 Abdominal bracing with march 2 x 10 each leg Hip adduction with ball 5"  2 x 10 Hip abduction with RTB 2 x 10 Abdominal bracing with SLR x 10 each Shoulder horizontal abduction RTB 2 x10   11/11/22 Supine: LTR x 20 Decompression 5" hold x 10; head press, shoulder press, leg press, leg lengthener  Bridge x 10 SLR x 10 each Abdominal set 5" hold x 10 Adominal set with march 2  x 10 Hip abduction with RTB 2 x 10 Hip adduction with ball 5" x 10  11/04/22 Review of goals, HEP and POC moving forward Supine:  Lower trunk rotation 10X10"each  Decompression 2-5 10X5" each  Bridge 10X  SLR 10X each with core stab Prone:  hamstring curls 10X each  Isometric hip extension (too weak to lift) 10X each  Heelsqueeze 10X5" Sidelying hip abduction 10X each side     10/17/22 physical therapy evaluation and HEP     Education details: Patient educated on exam findings, POC, scope of PT, HEP. Person educated: Patient Education method: Explanation, Demonstration, and Handouts Education comprehension: verbalized understanding, returned demonstration, verbal cues required, and tactile cues required   HOME EXERCISE PROGRAM: 11/19/22:  Decompression exercises with red theraband 1-4 10X each  11/13/22 Supine horizontal shoulder abduction 2 x 10 Access Code: [place]: [email or web address] Date: 11/11/2022 Prepared by: [facility]  Exercises - Sit to Stand  - 2 x daily - 7 x weekly - 1 sets - 10 reps - Supine Lower Trunk Rotation  - 2 x daily - 7 x weekly - 1 sets - 10 reps - Supine Scapular Retraction  - 2 x daily - 7 x weekly - 1 sets -  10 reps - Supine Bridge  - 2 x daily - 7 x weekly - 1 sets - 10 reps - Supine Active Straight Leg Raise  - 2 x daily - 7 x weekly - 1 sets - 10 reps - Supine Hip Adduction Isometric with Ball  - 2 x daily - 7 x weekly - 1 sets - 10 reps - 5 sec hold - Hooklying Clamshell with Resistance  - 2 x daily - 7 x weekly - 2 sets - 10 reps  Access Code: [email or web address] URL: [email or web address] Date: 10/17/2022 Prepared by: [facility]  Exercises - Sit to Stand  - 2 x daily - 7 x weekly - 1 sets - 10 reps - Supine Lower Trunk Rotation  - 2 x daily - 7 x weekly - 1 sets - 10 reps - Supine Scapular Retraction  - 2 x daily - 7 x weekly - 1 sets - 10 reps  ASSESSMENT:  CLINICAL IMPRESSION:  Patient with much improved breathing, no shortness of breath indicated today.  Pt able to complete all exercises this session with minimal cues needed. Added decompression exercises using Red theraband and given to add to HEP.  Patient will benefit from skilled physical therapy services to address these deficits to reduce pain and improve level of function with ADLs and functional mobility tasks.   OBJECTIVE IMPAIRMENTS: Abnormal gait, cardiopulmonary status limiting activity, decreased activity tolerance, decreased endurance, decreased mobility, difficulty walking, decreased ROM, decreased strength, hypomobility, increased fascial restrictions, impaired perceived functional ability, increased muscle spasms, impaired flexibility, impaired sensation, and pain.   ACTIVITY LIMITATIONS: carrying, lifting, bending, sitting, standing, squatting, sleeping, stairs, transfers, bed mobility, continence, bathing, locomotion level, and caring for others  PARTICIPATION LIMITATIONS: meal prep, cleaning, laundry, shopping, community activity, and yard work  PERSONAL FACTORS: 1-2 comorbidities: afib, CHF  are also affecting patient's functional outcome.   REHAB POTENTIAL: Good  CLINICAL DECISION MAKING:  Stable/uncomplicated  EVALUATION COMPLEXITY: Low   GOALS: Goals reviewed with patient? Yes  SHORT TERM GOALS: Target date: 11/07/2022  Patient will be independent with initial HEP Baseline: Goal status: IN PROGRESS  2.   Patient will report at least 50% improvement in overall symptoms and/or function to demonstrate improved functional mobility  Baseline:  Goal status: IN PROGRESS   LONG TERM GOALS: Target date: 11/21/2022  Patient will be independent in self management strategies to improve quality of life and functional outcomes.   Baseline:  Goal status: IN PROGRESS  2.   Patient will report at least 75% improvement in overall symptoms and/or function to demonstrate improved functional mobility  Baseline:  Goal status: IN PROGRESS  3.  Patient will increase lower extremity MMTs to 4+-5/5 without pain to promote return to ambulation community distances with minimal deviation.  Baseline:  Goal status: IN PROGRESS  4.  Patient will improve FOTO score to predicted value  Baseline: 40 Goal status: IN PROGRESS  5.  Patient will improve 5 times sit to stand score from 49 sec to 20 sec to demonstrate improved functional mobility and increased lower extremity strength.  Baseline:  Goal status: IN PROGRESS  PLAN:  PT FREQUENCY: 2x/week  PT DURATION: 4 weeks  Interventions:Therapeutic exercises, Therapeutic activity, Neuromuscular re-education, Balance training, Gait training, Patient/Family education, Joint manipulation, Joint mobilization, Stair training, Orthotic/Fit training, DME instructions, Aquatic Therapy, Dry Needling, Electrical stimulation, Spinal manipulation, Spinal mobilization, Cryotherapy, Moist heat, Compression bandaging, scar mobilization, Splintting, Taping, Traction, Ultrasound, Ionotophoresis '4mg'$ /ml Dexamethasone, and Manual therapy  PLAN FOR NEXT SESSION: Continue to progress lumbar mobility and core and lower extremity strengthening as able.  Pt  requires frequent rests due to fatigue from A-Fib. Complete reassessment next session.     3:40 PM, 11/19/22 Teena Irani, PTA/CLT [place] Ph: [phone]

## 2022-11-20 DIAGNOSIS — G5710 Meralgia paresthetica, unspecified lower limb: Secondary | ICD-10-CM | POA: Insufficient documentation

## 2022-11-20 NOTE — Assessment & Plan Note (Addendum)
Continue iron supplementation

## 2022-11-20 NOTE — Assessment & Plan Note (Signed)
Euvolemic on exam today.  Continue Lasix 40 mg daily

## 2022-11-20 NOTE — Assessment & Plan Note (Signed)
Continue daily vitamin D supplementation

## 2022-11-20 NOTE — Assessment & Plan Note (Signed)
Chronic issue.  Today she states that her pain is gradually improving with physical therapy.  She continues to take Flexeril 5 mg as needed at night.

## 2022-11-20 NOTE — Assessment & Plan Note (Signed)
She endorses a history of meralgia paresthetica, treated with gabapentin 300 mg twice daily.  She was previously followed by Dr. Merlene Laughter, but needs a new neurology referral today as his practice will be closing. -Neurology referral placed today

## 2022-11-20 NOTE — Assessment & Plan Note (Signed)
Currently prescribed atorvastatin 80 mg daily.  Lipid panel updated earlier this year and was well within goal.

## 2022-11-20 NOTE — Assessment & Plan Note (Signed)
TSH WNL when last checked in August.  Continue levothyroxine at current dose.

## 2022-11-20 NOTE — Assessment & Plan Note (Signed)
Returning for follow-up today -Needs to reschedule previously ordered mammogram -Follow-up in 3 months and plan to repeat labs at that time

## 2022-11-20 NOTE — Assessment & Plan Note (Signed)
History of persistent atrial fibrillation, previously requiring DCCV.  She is currently prescribed Tikosyn, metoprolol, and Eliquis.  She has follow-up in atrial fibrillation clinic tomorrow, where they are considering A-fib ablation.

## 2022-11-20 NOTE — Assessment & Plan Note (Signed)
Asymptomatic currently.  Denies recent chest pain.  Continue metoprolol and statin.

## 2022-11-20 NOTE — Assessment & Plan Note (Signed)
She is currently prescribed amlodipine, spironolactone, and metoprolol for treatment of hypertension.  No medication changes today.

## 2022-11-21 ENCOUNTER — Ambulatory Visit (HOSPITAL_COMMUNITY): Payer: Medicare Other | Admitting: Physical Therapy

## 2022-11-21 DIAGNOSIS — R29898 Other symptoms and signs involving the musculoskeletal system: Secondary | ICD-10-CM

## 2022-11-21 DIAGNOSIS — M545 Low back pain, unspecified: Secondary | ICD-10-CM | POA: Diagnosis not present

## 2022-11-21 DIAGNOSIS — M6281 Muscle weakness (generalized): Secondary | ICD-10-CM

## 2022-11-21 NOTE — Therapy (Addendum)
OUTPATIENT PHYSICAL THERAPY DISCHARGE/ TREATMENT PHYSICAL THERAPY DISCHARGE SUMMARY  Visits from Start of Care: 6  Current functional level related to goals / functional outcomes: See below   Remaining deficits: See below   Education / Equipment: See below   Patient agrees to discharge. Patient goals were partially met. Patient is being discharged due to being pleased with the current functional level.  Patient Name: Maria Fry MRN: [id number] DOB:1951/05/28, 71 y.o., female Today's Date: 11/21/2022   PT End of Session - 11/21/22 1308     Visit Number 6    Number of Visits 8    Date for PT Re-Evaluation 11/21/22    Authorization Type [employer] Medicare    Authorization Time Period no VL, 20% co insurance, no auth, no copay    Progress Note Due on Visit 10    PT Start Time 1306    PT Stop Time 1345    PT Time Calculation (min) 39 min    Behavior During Therapy WFL for tasks assessed/performed             Past Medical History:  Diagnosis Date   Arthritis    Right knee   Atrial fibrillation ([place])    CAD (coronary artery disease)    a. 06/07/15 NSTEMI/Cath: Dist LAD 100%, mid LAD 10%. Otw nl cors. Nl EF w/ inferoapical AK.   Essential hypertension    Hip pain    History of cardiomyopathy 04/23/2016   a. 04/2016 Echo: EF 45%, Gr2 DD; b. 11/2019 Echo: EF 45-50%, mod LVH. Gr2 DD. Nl RV fxn. Sev dil LA.    Hyperlipidemia    Hypothyroidism    NSTEMI (non-ST elevated myocardial infarction) ([place]) 06/05/2015   Vitamin D deficiency    Past Surgical History:  Procedure Laterality Date   ABDOMINAL HYSTERECTOMY     partial- one ovary remaining   BIOPSY  09/17/2020   Procedure: BIOPSY;  Surgeon: [name], DO;  [facility];  Service: Endoscopy;;   CARDIAC CATHETERIZATION N/A 06/06/2015   Procedure: Left Heart Cath and Coronary Angiography;  Surgeon: Peter M Martinique, MD;  Location: [place] [facility];  Service: Cardiovascular;  Laterality: N/A;   CARDIOVERSION  N/A 09/14/2020   Procedure: CARDIOVERSION;  Surgeon: Satira Sark, MD;  [facility]: [facility];  Service: Cardiovascular;  Laterality: N/A;   CARDIOVERSION N/A 06/20/2022   Procedure: CARDIOVERSION;  Surgeon: Arnoldo Lenis, MD;  Location: [facility];  Service: Endoscopy;  Laterality: N/A;   CHOLECYSTECTOMY     COLONOSCOPY     COLONOSCOPY N/A 08/13/2017   Procedure: COLONOSCOPY;  Surgeon: Daneil Dolin, MD;  [facility];  Service: Endoscopy;  Laterality: N/A;  8:30 AM   COLONOSCOPY WITH PROPOFOL N/A 01/18/2021   [name]: Multiple tubular adenomas removed, next colonoscopy in 3 years   ESOPHAGOGASTRODUODENOSCOPY N/A 09/11/2016   Procedure: ESOPHAGOGASTRODUODENOSCOPY (EGD);  Surgeon: Daneil Dolin, MD;  [facility];  Service: Endoscopy;  Laterality: N/A;  7:45 am - moved to 10/11 @ 10:30   ESOPHAGOGASTRODUODENOSCOPY (EGD) WITH PROPOFOL N/A 09/17/2020   [name]: Diffuse moderate inflammation characterized by erosions and erythema found in the entire stomach.  Biopsies positive for H. pylori.  Patient has not been treated due to drug allergies.   Heel spurs Left    MALONEY DILATION N/A 09/11/2016   Procedure: Venia Minks DILATION;  Surgeon: Daneil Dolin, MD;  [facility];  Service: Endoscopy;  Laterality: N/A;   POLYPECTOMY  08/13/2017  Procedure: POLYPECTOMY;  Surgeon: Daneil Dolin, MD;  [facility];  Service: Endoscopy;;  colon   POLYPECTOMY  01/18/2021   Procedure: POLYPECTOMY;  Surgeon: Daneil Dolin, MD;  [facility];  Service: Endoscopy;;   SHOULDER ARTHROSCOPY Right    TUBAL LIGATION     Patient Active Problem List   Diagnosis Date Noted   Meralgia paresthetica 11/20/2022   Lumbar back pain 09/25/2022   Encounter for general adult medical examination with abnormal findings 09/25/2022   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[place]) 07/01/2022   Elevated brain natriuretic peptide (BNP) level 06/19/2022   Insomnia 06/19/2022   Chest pain  06/18/2022   Hypomagnesemia 09/05/2021   Wheezing 03/06/2021   Sleep apnea 02/06/2021   Vitamin D insufficiency 01/30/2021   Encounter to establish care 81/44/8185   Helicobacter pylori gastritis 11/01/2020   H/O adenomatous polyp of colon 10/31/2020   Constipation 10/15/2020   History of GI bleed 10/15/2020   Persistent atrial fibrillation ([place]) 10/10/2020   Secondary hypercoagulable state ([place]) 10/05/2020   Obesity, Class III, BMI 40-49.9 (morbid obesity) ([place])    Gastroesophageal reflux disease    Gastritis    Acquired hypothyroidism    Melena    CHF exacerbation ([place]) 09/15/2020   Atrial fibrillation, chronic ([place]) 08/23/2020   Stable angina 01/14/2020   Abnormal nuclear stress test 12/08/2019   Iron deficiency anemia 11/28/2019   Mixed hyperlipidemia 11/27/2019   OA (osteoarthritis) of knee 05/18/2019   DDD (degenerative disc disease), lumbar 05/18/2019   Obesity 12/26/2017   Seasonal allergies 08/19/2016   Glucose intolerance (impaired glucose tolerance) 05/14/2016   Chronic hip pain 05/14/2016   Chronic combined systolic and diastolic heart failure ([place]) 05/14/2016   Hypothyroidism following radioiodine therapy 11/30/2015   CAD (coronary artery disease)    NSTEMI (non-ST elevated myocardial infarction) ([place])    Essential hypertension 06/05/2015    PCP: Olin Hauser, PCP  REFERRING PROVIDER: Olin Hauser, PCP  REFERRING DIAG: M54.50,G89.29 (ICD-10-CM) - Chronic bilateral low back pain without sciatica  Rationale for Evaluation and Treatment: Rehabilitation  THERAPY DIAG:  Low back pain, unspecified back pain laterality, unspecified chronicity, unspecified whether sciatica present  Other symptoms and signs involving the musculoskeletal system  Muscle weakness (generalized)  ONSET DATE: increased pain 2 months ago  SUBJECTIVE:                                                                                                                                                                                            SUBJECTIVE STATEMENT: Pt states her fatigue keeps her from progressing because of her AFib. Returns to MD jan 11 to  set date for her ablation with hopes it will help her fatigue. Pt reports her pain comes and goes in her LB but it has improved overall.   Eval: has trouble grocery shopping, cooking etc. 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  MD gave her flexeril; didn't help much.    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 No  PRECAUTIONS: None  WEIGHT BEARING RESTRICTIONS: No  FALLS:  Has patient fallen in last 6 months? No  LIVING ENVIRONMENT: Lives with: lives with their family Lives in: Mobile home Stairs: Yes: External: 5 steps; on right going up, on left going up, and can reach both Has following equipment at home: Single point cane and Wheelchair (manual)  OCCUPATION: retired  PLOF: Independent  PATIENT GOALS: walk and stand longer without pain  NEXT MD VISIT:   OBJECTIVE:   DIAGNOSTIC FINDINGS:  No recent  PATIENT SURVEYS:  FOTO 11/21/22: 45% ;at evaluation 40%  COGNITION: Overall cognitive status: Within functional limits for tasks assessed     SENSATION: WFL Right hip neuropathy per patient   POSTURE: rounded shoulders, forward head, decreased lumbar lordosis, and increased thoracic kyphosis  PALPATION: Tender to upper lumbar and lower thoracic spine  LUMBAR ROM:   AROM eval 11/21/22  Flexion 50% available 50% avail  Extension 20% available 20% avail  Right lateral flexion    Left lateral flexion    Right rotation    Left rotation     (Blank rows = not tested)   LOWER EXTREMITY MMT:    MMT Right eval Left eval Right 11/21/22 Left 11/21/22  Hip flexion 4+ 4+ 4+ 4+  Hip extension 3- 3- 3- 3-  Hip  abduction      Hip adduction      Hip internal rotation      Hip external rotation      Knee flexion 4 4- 4+ 4+  Knee extension 5 5    Ankle dorsiflexion 5 5    Ankle plantarflexion      Ankle inversion      Ankle eversion       (Blank rows = not tested)   FUNCTIONAL TESTS:  5 times sit to stand:  11/21/22: 14.45 sec; at evaluation: 49 sec    TODAY'S TREATMENT:                                                                                                                              DATE:  11/21/22:  REASSESSMENT FOTO 11/21/22: 45% ;at evaluation 40% 5 times sit to stand:  11/21/22: 14.45 sec; at evaluation: 49 sec MMT (same except improvement in hamstring) ROM (same as eval) HEP review Goal review  11/19/22 Supine:  Decompression 1-5 5" holds 10 reps  Decompression  with Red thera band 1-4 10X  Lower trunk rotations 20X  Bridge 10X2 Abdominal bracing with march 2 x 10 each leg SLR with abdominal bracing 2X10 each  11/13/22 Supine:  LTR x 20 Decompression 5" hold x 10; head press, shoulder press, leg press, leg lengthener Abdominal bracing 5" x 10 Bridge x 10 Abdominal bracing with march 2 x 10 each leg Hip adduction with ball 5"  2 x 10 Hip abduction with RTB 2 x 10 Abdominal bracing with SLR x 10 each Shoulder horizontal abduction RTB 2 x10   11/11/22 Supine: LTR x 20 Decompression 5" hold x 10; head press, shoulder press, leg press, leg lengthener  Bridge x 10 SLR x 10 each Abdominal set 5" hold x 10 Adominal set with march 2 x 10 Hip abduction with RTB 2 x 10 Hip adduction with ball 5" x 10  11/04/22 Review of goals, HEP and POC moving forward Supine:  Lower trunk rotation 10X10"each  Decompression 2-5 10X5" each  Bridge 10X  SLR 10X each with core stab Prone:  hamstring curls 10X each  Isometric hip extension (too weak to lift) 10X each  Heelsqueeze 10X5" Sidelying hip abduction 10X each side     10/17/22 physical therapy evaluation and  HEP     Education details: Patient educated on exam findings, POC, scope of PT, HEP. Person educated: Patient Education method: Explanation, Demonstration, and Handouts Education comprehension: verbalized understanding, returned demonstration, verbal cues required, and tactile cues required   HOME EXERCISE PROGRAM: 11/19/22:  Decompression exercises with red theraband 1-4 10X each  11/13/22 Supine horizontal shoulder abduction 2 x 10 Access Code: [place]: [email or web address] Date: 11/11/2022 Prepared by: [facility]  Exercises - Sit to Stand  - 2 x daily - 7 x weekly - 1 sets - 10 reps - Supine Lower Trunk Rotation  - 2 x daily - 7 x weekly - 1 sets - 10 reps - Supine Scapular Retraction  - 2 x daily - 7 x weekly - 1 sets - 10 reps - Supine Bridge  - 2 x daily - 7 x weekly - 1 sets - 10 reps - Supine Active Straight Leg Raise  - 2 x daily - 7 x weekly - 1 sets - 10 reps - Supine Hip Adduction Isometric with Ball  - 2 x daily - 7 x weekly - 1 sets - 10 reps - 5 sec hold - Hooklying Clamshell with Resistance  - 2 x daily - 7 x weekly - 2 sets - 10 reps  Access Code: [email or web address] URL: [email or web address] Date: 10/17/2022 Prepared by: [facility]  Exercises - Sit to Stand  - 2 x daily - 7 x weekly - 1 sets - 10 reps - Supine Lower Trunk Rotation  - 2 x daily - 7 x weekly - 1 sets - 10 reps - Supine Scapular Retraction  - 2 x daily - 7 x weekly - 1 sets - 10 reps  ASSESSMENT:  CLINICAL IMPRESSION: Reassessment completed this session.  Pt has made functional gains as well as overall improvement in back pain and mobilty.  Pt continues to be limited by her AFib, which has reduced her activity tolerance and ability to progress further.  Pt is pleased with her progress and would like to continue her HEP at this time until after her procedure for AFib.  Pt with no further questions or concerns at this time.  OBJECTIVE IMPAIRMENTS: Abnormal gait,  cardiopulmonary status limiting activity, decreased activity  tolerance, decreased endurance, decreased mobility, difficulty walking, decreased ROM, decreased strength, hypomobility, increased fascial restrictions, impaired perceived functional ability, increased muscle spasms, impaired flexibility, impaired sensation, and pain.   ACTIVITY LIMITATIONS: carrying, lifting, bending, sitting, standing, squatting, sleeping, stairs, transfers, bed mobility, continence, bathing, locomotion level, and caring for others  PARTICIPATION LIMITATIONS: meal prep, cleaning, laundry, shopping, community activity, and yard work  PERSONAL FACTORS: 1-2 comorbidities: afib, CHF  are also affecting patient's functional outcome.   REHAB POTENTIAL: Good  CLINICAL DECISION MAKING: Stable/uncomplicated  EVALUATION COMPLEXITY: Low   GOALS: Goals reviewed with patient? Yes  SHORT TERM GOALS: Target date: 11/07/2022  Patient will be independent with initial HEP Baseline: Goal status: MET  2.   Patient will report at least 50% improvement in overall symptoms and/or function to demonstrate improved functional mobility  Baseline:  12/21: reports 10% improvement Goal status: IN PROGRESS   LONG TERM GOALS: Target date: 11/21/2022  Patient will be independent in self management strategies to improve quality of life and functional outcomes.   Baseline:  Goal status: IN PROGRESS  2.   Patient will report at least 75% improvement in overall symptoms and/or function to demonstrate improved functional mobility  Baseline:  Goal status: IN PROGRESS  3.  Patient will increase lower extremity MMTs to 4+-5/5 without pain to promote return to ambulation community distances with minimal deviation.  Baseline:  Goal status: IN PROGRESS  4.  Patient will improve FOTO score to predicted value  Baseline: 40 Goal status: IN PROGRESS  5.  Patient will improve 5 times sit to stand score from 49 sec to 20 sec to  demonstrate improved functional mobility and increased lower extremity strength.  Baseline:  Goal status: MET  PLAN:  PT FREQUENCY: 2x/week  PT DURATION: 4 weeks  Interventions:Therapeutic exercises, Therapeutic activity, Neuromuscular re-education, Balance training, Gait training, Patient/Family education, Joint manipulation, Joint mobilization, Stair training, Orthotic/Fit training, DME instructions, Aquatic Therapy, Dry Needling, Electrical stimulation, Spinal manipulation, Spinal mobilization, Cryotherapy, Moist heat, Compression bandaging, scar mobilization, Splintting, Taping, Traction, Ultrasound, Ionotophoresis 47m/ml Dexamethasone, and Manual therapy  PLAN FOR NEXT SESSION: Discharge to HEP at this time.  1:22 PM, 11/21/22 ATeena Irani PTA/CLT CCos CobPh: [phone] 3:11 PM, 11/21/22 Shereena Berquist [place] MPT [facility] [place] #[phone]

## 2022-11-26 ENCOUNTER — Encounter (HOSPITAL_COMMUNITY): Payer: Medicare Other

## 2022-11-28 ENCOUNTER — Encounter (HOSPITAL_COMMUNITY): Payer: Medicare Other

## 2022-12-05 ENCOUNTER — Other Ambulatory Visit: Payer: Self-pay | Admitting: Physician Assistant

## 2022-12-09 ENCOUNTER — Encounter: Payer: Self-pay | Admitting: Family Medicine

## 2022-12-09 ENCOUNTER — Ambulatory Visit (INDEPENDENT_AMBULATORY_CARE_PROVIDER_SITE_OTHER): Payer: Medicare Other | Admitting: Family Medicine

## 2022-12-09 VITALS — BP 110/66 | HR 74 | Ht 63.0 in | Wt 220.0 lb

## 2022-12-09 DIAGNOSIS — Z0000 Encounter for general adult medical examination without abnormal findings: Secondary | ICD-10-CM

## 2022-12-09 NOTE — Progress Notes (Unsigned)
Annual Wellness Visit     Patient: Maria Fry, Female    DOB: [birth year], 72 y.o.   MRN: [id number]  Subjective  Chief Complaint  Patient presents with   Annual Exam    Maria Fry is a 72 y.o. female who presents today for her Annual Wellness Visit. She reports consuming a low sodium diet. The patient does not participate in regular exercise at present. She generally feels tired. She reports sleeping fairly well. She does not have additional problems to discuss today.   HPI  Vision:Last exam was on 2021   Patient Active Problem List   Diagnosis Date Noted   Meralgia paresthetica 11/20/2022   Lumbar back pain 09/25/2022   Encounter for general adult medical examination with abnormal findings 09/25/2022   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[place]) 07/01/2022   Elevated brain natriuretic peptide (BNP) level 06/19/2022   Insomnia 06/19/2022   Chest pain 06/18/2022   Hypomagnesemia 09/05/2021   Wheezing 03/06/2021   Sleep apnea 02/06/2021   Vitamin D insufficiency 01/30/2021   Encounter to establish care 12/10/3233   Helicobacter pylori gastritis 11/01/2020   H/O adenomatous polyp of colon 10/31/2020   Constipation 10/15/2020   History of GI bleed 10/15/2020   Persistent atrial fibrillation ([place]) 10/10/2020   Secondary hypercoagulable state ([place]) 10/05/2020   Obesity, Class III, BMI 40-49.9 (morbid obesity) ([place])    Gastroesophageal reflux disease    Gastritis    Acquired hypothyroidism    Melena    CHF exacerbation ([place]) 09/15/2020   Atrial fibrillation, chronic ([place]) 08/23/2020   Stable angina 01/14/2020   Abnormal nuclear stress test 12/08/2019   Iron deficiency anemia 11/28/2019   Mixed hyperlipidemia 11/27/2019   OA (osteoarthritis) of knee 05/18/2019   DDD (degenerative disc disease), lumbar 05/18/2019   Obesity 12/26/2017   Seasonal allergies 08/19/2016   Glucose intolerance (impaired glucose tolerance) 05/14/2016   Chronic hip pain 05/14/2016   Chronic combined systolic  and diastolic heart failure ([place]) 05/14/2016   Hypothyroidism following radioiodine therapy 11/30/2015   CAD (coronary artery disease)    NSTEMI (non-ST elevated myocardial infarction) ([place])    Essential hypertension 06/05/2015      Medications: Outpatient Medications Prior to Visit  Medication Sig   acetaminophen (TYLENOL) 500 MG tablet Take 500 mg by mouth every 6 (six) hours as needed for mild pain or moderate pain.   amLODipine (NORVASC) 2.5 MG tablet Take 1 tablet (2.5 mg total) by mouth daily.   atorvastatin (LIPITOR) 80 MG tablet Take 1 tablet (80 mg total) by mouth at bedtime.   azelastine (ASTELIN) 0.1 % nasal spray Place 1 spray into both nostrils 2 (two) times daily. Use in each nostril as directed   calcium carbonate (OS-CAL - DOSED IN MG OF ELEMENTAL CALCIUM) 1250 (500 Ca) MG tablet Take 500 mg by mouth daily.   Cholecalciferol (VITAMIN D3) 50 MCG (2000 UT) capsule Take 1 capsule (2,000 Units total) by mouth daily.   cyclobenzaprine (FLEXERIL) 5 MG tablet Take 1 tablet (5 mg total) by mouth 3 (three) times daily as needed for muscle spasms.   diclofenac Sodium (VOLTAREN) 1 % GEL Apply 1 application topically 4 (four) times daily as needed (pain).   dofetilide (TIKOSYN) 125 MCG capsule Take 1 capsule by mouth twice daily   ELIQUIS 5 MG TABS tablet Take 1 tablet by mouth twice daily   ferrous sulfate 325 (65 FE) MG EC tablet Take 1 tablet (325 mg total) by mouth daily.   furosemide (LASIX) 40 MG  tablet Take 1 tablet (40 mg total) by mouth daily.   gabapentin (NEURONTIN) 300 MG capsule Take 1 capsule (300 mg total) by mouth 2 (two) times daily.   levothyroxine (SYNTHROID) 100 MCG tablet Take 1 tablet (100 mcg total) by mouth daily before breakfast.   loratadine (CLARITIN) 10 MG tablet Take 10 mg by mouth daily.   magnesium oxide (MAG-OX) 400 MG tablet Take 0.5 tablets (200 mg total) by mouth daily.   melatonin 3 MG TABS tablet Take 3 mg by mouth at bedtime.   metoprolol tartrate  (LOPRESSOR) 25 MG tablet TAKE ONE-HALF TABLET BY MOUTH TWO TIMES DAILY   nitroGLYCERIN (NITROSTAT) 0.4 MG SL tablet DISSOLVE ONE TABLET UNDER THE TONGUE EVERY 5 MINUTES AS NEEDED FOR CHEST PAIN.  DO NOT EXCEED A TOTAL OF 3 DOSES IN 15 MINUTES (Patient taking differently: DISSOLVE ONE TABLET UNDER THE TONGUE EVERY 5 MINUTES AS NEEDED FOR CHEST PAIN.  DO NOT EXCEED A TOTAL OF 3 DOSES IN 15 MINUTES)   ondansetron (ZOFRAN-ODT) 4 MG disintegrating tablet Take 1 tablet (4 mg total) by mouth every 8 (eight) hours as needed for nausea or vomiting.   potassium chloride SA (KLOR-CON M) 20 MEQ tablet Take 2 tablets (40 mEq total) by mouth daily.   spironolactone (ALDACTONE) 25 MG tablet Take 1/2 (one-half) tablet by mouth once daily   vitamin C (ASCORBIC ACID) 500 MG tablet Take 500 mg by mouth daily.   No facility-administered medications prior to visit.    Allergies  Allergen Reactions   Afrin [Oxymetazoline] Anaphylaxis   Amoxicillin Anaphylaxis   Lisinopril Anaphylaxis   Ranolazine Anaphylaxis    Throat swells, Dizzy, Reaction occurred with generic med   Vicodin [Hydrocodone-Acetaminophen] Diarrhea and Nausea And Vomiting   Demerol [Meperidine] Nausea And Vomiting   Flonase [Fluticasone]     anaphylaxis   Lodine [Etodolac] Rash    Patient Care Team: Johnette Abraham, MD as PCP - General (Internal Medicine) Satira Sark, MD as PCP - Cardiology (Cardiology) Mealor, Yetta Barre, MD as PCP - Electrophysiology (Cardiology) Danie Binder, MD (Inactive) as Consulting Physician (Gastroenterology) Edythe Clarity, [facility] as Pharmacist (Pharmacist)  ROS      Objective  BP 110/66   Pulse 74   Ht '5\' 3"'$  (1.6 m)   Wt 220 lb (99.8 kg)   SpO2 98%   BMI 38.97 kg/m  BP Readings from Last 3 Encounters:  12/09/22 110/66  11/15/22 (!) 150/92  11/14/22 133/69    Physical Exam    Most recent functional status assessment:    06/19/2022    3:00 AM  In your present state of health, do  you have any difficulty performing the following activities:  Hearing? 0  Vision? 0  Difficulty concentrating or making decisions? 0  Walking or climbing stairs? 0  Dressing or bathing? 0  Doing errands, shopping? 0   Most recent fall risk assessment:    12/09/2022    4:16 PM  Fall Risk   Falls in the past year? 0  Number falls in past yr: 0  Injury with Fall? 0  Risk for fall due to : No Fall Risks  Follow up Falls evaluation completed    Most recent depression screenings:    12/09/2022    4:17 PM 11/14/2022   10:59 AM  PHQ 2/9 Scores  PHQ - 2 Score 0 0   Most recent cognitive screening:    11/28/2021    1:03 PM  6CIT Screen  What Year?  0 points  What month? 0 points  What time? 3 points  Count back from 20 0 points  Months in reverse 0 points  Repeat phrase 0 points  Total Score 3 points   Most recent Audit-C alcohol use screening    11/28/2021    1:02 PM  Alcohol Use Disorder Test (AUDIT)  1. How often do you have a drink containing alcohol? 0  2. How many drinks containing alcohol do you have on a typical day when you are drinking? 0  3. How often do you have six or more drinks on one occasion? 0  AUDIT-C Score 0   A score of 3 or more in women, and 4 or more in men indicates increased risk for alcohol abuse, EXCEPT if all of the points are from question 1   Vision/Hearing Screen: No results found.  Last CBC Lab Results  Component Value Date   WBC 7.9 09/05/2022   HGB 9.4 (L) 09/05/2022   HCT 30.6 (L) 09/05/2022   MCV 79.9 (L) 09/05/2022   MCH 24.5 (L) 09/05/2022   RDW 23.8 (H) 09/05/2022   PLT 378 16/96/7893   Last metabolic panel Lab Results  Component Value Date   GLUCOSE 97 11/15/2022   NA 136 11/15/2022   K 4.4 11/15/2022   CL 105 11/15/2022   CO2 27 11/15/2022   BUN 15 11/15/2022   CREATININE 0.92 11/15/2022   GFRNONAA >60 11/15/2022   CALCIUM 8.5 (L) 11/15/2022   PHOS 3.1 06/19/2022   PROT 7.4 07/17/2022   ALBUMIN 3.7 (L)  07/17/2022   LABGLOB 3.7 07/17/2022   AGRATIO 1.0 (L) 07/17/2022   BILITOT 0.4 07/17/2022   ALKPHOS 104 07/17/2022   AST 15 07/17/2022   ALT 11 07/17/2022   ANIONGAP 4 (L) 11/15/2022   Last lipids Lab Results  Component Value Date   CHOL 123 03/06/2022   HDL 48 03/06/2022   LDLCALC 63 03/06/2022   TRIG 51 03/06/2022   CHOLHDL 2.6 03/06/2022   Last hemoglobin A1c Lab Results  Component Value Date   HGBA1C 5.5 01/03/2020   Last thyroid functions Lab Results  Component Value Date   TSH 2.120 07/17/2022   Last vitamin D Lab Results  Component Value Date   VD25OH 22.7 (L) 03/06/2022   Last vitamin B12 and Folate Lab Results  Component Value Date   VITAMINB12 403 08/23/2020      No results found for any visits on 12/09/22.    Assessment & Plan   Annual wellness visit done today including the all of the following: Reviewed patient's Family Medical History Reviewed and updated list of patient's medical providers Assessment of cognitive impairment was done Assessed patient's functional ability Established a written schedule for health screening [place] Completed and Reviewed  Exercise Activities and Dietary recommendations  Goals      Pharmacy Care Plan:     CARE PLAN ENTRY (see longitudinal plan of care for additional care plan information)  Current Barriers:  Chronic Disease Management support, education, and care coordination needs related to Hypertension, Hyperlipidemia, Heart Failure, Coronary Artery Disease, and anemia   Hypertension BP Readings from Last 3 Encounters:  12/22/20 122/84  11/08/20 134/78  10/31/20 132/78  Pharmacist Clinical Goal(s): Over the next 120 days, patient will work with PharmD and providers to achieve BP goal <130/80 Current regimen:  Metoprolol '25mg'$  twice daily Interventions: Reviewed home monitoring procedures Comprehensive medication review Patient self care activities - Over the next 180 days,  patient will: Check BP periodically, document, and provide at future appointments Ensure daily salt intake < 2300 mg/day  Hyperlipidemia Lab Results  Component Value Date/Time   [facility] 63 01/03/2020 09:48 AM  Pharmacist Clinical Goal(s): Over the next 180 days, patient will work with PharmD and providers to maintain LDL goal < 70 Current regimen:  Atorvastatin '80mg'$  daily Interventions: Reviewed most recent lipid panel Counseled on importance of compliance with statin medications. Patient self care activities - Over the next 180 days, patient will: Continue to take medication as prescribed Focus on adherence by pill box  Heart Failure Pharmacist Clinical Goal(s) Over the next 180 days, patient will work with PharmD and providers to optimize medication and minimize symptoms related to HF. Current regimen:  Metoprolol tartrate '25mg'$  bid Furosemide '40mg'$  daily four times per week, and twice daily 3 times per week Interventions: Counseled on importance of monitoring weight daily Patient self care activities - Over the next 180 days, patient will: Begin to monitor weight daily as directed  Contact providers if you gain more than 3 pounds in one day or 5 pounds in a week.   CAD Pharmacist Clinical Goal(s) Over the next 180 days, patient will work with PharmD and providers to optimize medication and minimize symptoms related to CAD. Current regimen:  ASA '81mg'$  Raenxa '1000mg'$  twice daily Interventions: Discussed cost/affordability of Ranexa. Researched other programs patient may be eligible for copay assistance. Patient self care activities - Over the next 180 days, patient will: Continue to take medication as she has been, wait for PharmD to reach out regarding programs for copay assistance.  Anemia Pharmacist Clinical Goal(s) Over the next 120 days, patient will work with PharmD and providers to optimize medication and minimize symptoms related to anemia. Current regimen:   Ferrous Sulfate '325mg'$  daily Interventions: Counseled on importance of taking medication daily Discussed switching to night time dosing if makes patient tired. Reminded to d/c iron one week prior to colonoscopy Patient self care activities - Over the next 120 days, patient will: Begin to take medication in the evening to see if this helps with sleepiness  Afib Pharmacist Clinical Goal(s) Over the next 120 days, patient will work with PharmD and providers to optimize medication and minimize symptoms related to Afib Current regimen:  Eliquis '5mg'$  twice daily Tikosyn 125 mcg  Interventions: Evaluated for abnormal bleed Explained Eliquis PAP program to patient  Patient self care activities - Over the next 120 days, patient will: Notify PharmD when [phone] spend out of pocket has been met Remember to d/c Eliquis before colonoscopy as directed  Please see past updates related to this goal by clicking on the "Past Updates" button in the selected goal          Immunization History  Administered Date(s) Administered   Fluad Quad(high Dose 65+) 10/21/2019, 08/18/2020, 09/05/2021, 09/19/2022   Influenza, High Dose Seasonal PF 08/24/2018   Influenza,inj,Quad PF,6+ Mos 10/02/2015, 08/19/2016, 08/25/2017, 09/02/2019   PFIZER(Purple Top)SARS-COV-2 Vaccination 01/24/2020, 02/14/2020, 10/31/2020, 06/29/2021   Pneumococcal Conjugate-13 01/01/2019   Pneumococcal Polysaccharide-23 01/03/2020    Health Maintenance  Topic Date Due   DTaP/Tdap/Td (1 - Tdap) Never done   Zoster Vaccines- Shingrix (1 of 2) Never done   MAMMOGRAM  02/02/2021   COVID-19 Vaccine (5 - 2023-24 season) 08/02/2022   Medicare Annual Wellness (AWV)  12/10/2023   COLONOSCOPY (Pts 45-71yr Insurance coverage will need to be confirmed)  01/18/2031   Pneumonia Vaccine 70 Years old  Completed   INFLUENZA VACCINE  Completed   DEXA SCAN  Completed   Hepatitis C Screening  Completed   HPV VACCINES  Aged Out     Discussed  health benefits of physical activity, and encouraged her to engage in regular exercise appropriate for her age and condition.    Problem List Items Addressed This Visit   None Visit Diagnoses     Encounter for Medicare annual wellness exam    -  Primary       Return in 1 year (on 12/10/2023).     Renard Hamper Ria Comment, FNP

## 2022-12-12 ENCOUNTER — Ambulatory Visit: Payer: Medicare Other | Attending: Cardiovascular Disease | Admitting: Cardiovascular Disease

## 2022-12-12 ENCOUNTER — Ambulatory Visit: Payer: Medicare Other | Admitting: Cardiology

## 2022-12-12 ENCOUNTER — Encounter: Payer: Self-pay | Admitting: Cardiovascular Disease

## 2022-12-12 VITALS — BP 126/80 | HR 61 | Ht 63.0 in | Wt 217.0 lb

## 2022-12-12 DIAGNOSIS — I4819 Other persistent atrial fibrillation: Secondary | ICD-10-CM

## 2022-12-12 DIAGNOSIS — I4892 Unspecified atrial flutter: Secondary | ICD-10-CM | POA: Diagnosis not present

## 2022-12-12 MED ORDER — AMIODARONE HCL 200 MG PO TABS: 200.0000 mg | ORAL_TABLET | Freq: Two times a day (BID) | ORAL | 3 refills | Status: DC

## 2022-12-12 NOTE — Progress Notes (Signed)
Electrophysiology Office Note:    Date:  12/12/2022   ID:  Maria Fry, DOB 03/18/1951, MRN [id number]  PCP:  Johnette Abraham, MD   [place] Providers Cardiologist:  Rozann Lesches, MD Electrophysiologist:  Melida Quitter, MD     Referring MD: Oliver Barre, PA   History of Present Illness:    Maria Fry is a 72 y.o. female with a hx listed below, significant for CAD, chronic combined systolic and diastolic CHF, OSA, hypothyroid, persistent atrial fibrillation referred for arrhythmia management.  She was initially diagnosed with atrial fibrillation in Sept 2021.  She has been managed with Tikosyn but has had episodes of breakthrough.  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  She has fatigue and shortness of breath with exertion.  No significant chest pain, dependent edema.  Past Medical History:  Diagnosis Date   Arthritis    Right knee   Atrial fibrillation (HCC)    CAD (coronary artery disease)    a. 06/07/15 NSTEMI/Cath: Dist LAD 100%, mid LAD 10%. Otw nl cors. Nl EF w/ inferoapical AK.   Essential hypertension    Hip pain    History of cardiomyopathy 04/23/2016   a. 04/2016 Echo: EF 45%, Gr2 DD; b. 11/2019 Echo: EF 45-50%, mod LVH. Gr2 DD. Nl RV fxn. Sev dil LA.    Hyperlipidemia    Hypothyroidism    NSTEMI (non-ST elevated myocardial infarction) ([place]) 06/05/2015   Vitamin D deficiency     Past Surgical History:  Procedure Laterality Date   ABDOMINAL HYSTERECTOMY     partial- one ovary remaining   BIOPSY  09/17/2020   Procedure: BIOPSY;  Surgeon: [name], DO;  [facility];  Service: Endoscopy;;   CARDIAC CATHETERIZATION N/A 06/06/2015   Procedure: Left Heart Cath and Coronary Angiography;  Surgeon: Peter M Martinique, MD;  Location: [facility];  Service: Cardiovascular;  Laterality: N/A;   CARDIOVERSION N/A 09/14/2020   Procedure: CARDIOVERSION;  Surgeon: Satira Sark, MD;  Location: [facility];   Service: Cardiovascular;  Laterality: N/A;   CARDIOVERSION N/A 06/20/2022   Procedure: CARDIOVERSION;  Surgeon: Arnoldo Lenis, MD;  Location: [facility];  Service: Endoscopy;  Laterality: N/A;   CHOLECYSTECTOMY     COLONOSCOPY     COLONOSCOPY N/A 08/13/2017   Procedure: COLONOSCOPY;  Surgeon: Daneil Dolin, MD;  [facility];  Service: Endoscopy;  Laterality: N/A;  8:30 AM   COLONOSCOPY WITH PROPOFOL N/A 01/18/2021   [name]: Multiple tubular adenomas removed, next colonoscopy in 3 years   ESOPHAGOGASTRODUODENOSCOPY N/A 09/11/2016   Procedure: ESOPHAGOGASTRODUODENOSCOPY (EGD);  Surgeon: Daneil Dolin, MD;  [facility]: [facility];  Service: Endoscopy;  Laterality: N/A;  7:45 am - moved to 10/11 @ 10:30   ESOPHAGOGASTRODUODENOSCOPY (EGD) WITH PROPOFOL N/A 09/17/2020   [name]: Diffuse moderate inflammation characterized by erosions and erythema found in the entire stomach.  Biopsies positive for H. pylori.  Patient has not been treated due to drug allergies.   Heel spurs Left    MALONEY DILATION N/A 09/11/2016   Procedure: Venia Minks DILATION;  Surgeon: Daneil Dolin, MD;  [facility];  Service: Endoscopy;  Laterality: N/A;   POLYPECTOMY  08/13/2017   Procedure: POLYPECTOMY;  Surgeon: Daneil Dolin, MD;  [facility];  Service: Endoscopy;;  colon   POLYPECTOMY  01/18/2021   Procedure: POLYPECTOMY;  Surgeon: Daneil Dolin, MD;  [facility];  Service: Endoscopy;;   SHOULDER ARTHROSCOPY Right    TUBAL LIGATION      Current Medications: No outpatient medications have been marked as taking for the 12/12/22 encounter (Appointment) with Krystel Fletchall, Yetta Barre, MD.     Allergies:   Afrin [oxymetazoline], Amoxicillin, Lisinopril, Ranolazine, Vicodin [hydrocodone-acetaminophen], Demerol [meperidine], Flonase [fluticasone], and Lodine [etodolac]   Social History   Socioeconomic History   Marital status: Single    Spouse name: Not on file   Number of  children: 3   Years of education: 10   Highest education level: Not on file  Occupational History   Occupation: team leader at [employer]   Smoking status: Former    Packs/day: 1.00    Years: 20.00    Total pack years: 20.00    Types: Cigarettes    Start date: 12/02/1977    Quit date: 12/02/2002    Years since quitting: 20.0   Smokeless tobacco: Never   Tobacco comments:    Former smoker 05/10/22  Vaping Use   Vaping Use: Never used  Substance and Sexual Activity   Alcohol use: No    Alcohol/week: 0.0 standard drinks of alcohol   Drug use: No   Sexual activity: Not Currently  Other Topics Concern   Not on file  Social History Narrative   Retired from [employer].   Social Determinants of Health   Financial Resource Strain: High Risk (12/09/2022)   Overall Financial Resource Strain (CARDIA)    Difficulty of Paying Living Expenses: Hard  Food Insecurity: No Food Insecurity (12/09/2022)   Hunger Vital Sign    Worried About Running Out of Food in the Last Year: Never true    Ran Out of Food in the Last Year: Never true  Transportation Needs: No Transportation Needs (12/09/2022)   PRAPARE - Hydrologist (Medical): No    Lack of Transportation (Non-Medical): No  Physical Activity: Inactive (12/09/2022)   Exercise Vital Sign    Days of Exercise per Week: 0 days    Minutes of Exercise per Session: 0 min  Stress: No Stress Concern Present (12/09/2022)   [place]    Feeling of Stress : Not at all  Social Connections: Moderately Integrated (12/09/2022)   Social Connection and Isolation Panel [NHANES]    Frequency of Communication with Friends and Family: More than three times a week    Frequency of Social Gatherings with Friends and Family: Once a week    Attends Religious Services: 1 to 4 times per year    Active Member of [employer] or Organizations: No    Attends Programme researcher, broadcasting/film/video: Never    Marital Status: Living with partner     Family History: The patient's family history includes Arthritis in her daughter; COPD in her daughter; Cancer in her brother; Heart attack in her brother and father; Heart failure in her brother and father; Lupus in her daughter; Stroke in her sister. There is no history of Colon cancer.  ROS:   Please see the history of present illness.    All other systems reviewed and are negative.  EKGs/Labs/Other Studies Reviewed Today:     TTE: EF 55%, severe LVH  EKG:  Last EKG results: atypical atrial flutter, Left and right atria are normal in size   Recent Labs: 06/18/2022: B Natriuretic Peptide 305.0 07/17/2022: ALT 11; TSH 2.120 09/05/2022: Hemoglobin 9.4; Platelets 378 11/15/2022: BUN 15; Creatinine, Ser  0.92; Magnesium 2.2; Potassium 4.4; Sodium 136     Physical Exam:    VS:  There were no vitals taken for this visit.    Wt Readings from Last 3 Encounters:  12/09/22 220 lb (99.8 kg)  11/15/22 216 lb 6.4 oz (98.2 kg)  11/14/22 217 lb 3.2 oz (98.5 kg)    GEN:  Well nourished, well developed in no acute distress CARDIAC: iRRR, no murmurs, rubs, gallops RESPIRATORY:  Normal work of breathing MUSCULOSKELETAL: no edema    ASSESSMENT & PLAN:    Persistent atrial fibrillation/atypical atrial flutter: will transition to amiodarone. Ablation may be difficult with what appears to be an atypical atrial flutter. Will load with 200 BID for 2 weeks, then plan for DC cardioversion. I encouraged weight loss. With weight loss may consider ablation in the future. Secondary hypercoagulable state: CHADs2Vasc 5.  CHFrEF        Medication Adjustments/Labs and Tests Ordered: Current medicines are reviewed at length with the patient today.  Concerns regarding medicines are outlined above.  No orders of the defined types were placed in this encounter.  No orders of the defined types were placed in this  encounter.    Signed, Melida Quitter, MD  12/12/2022 12:19 PM    [place]

## 2022-12-12 NOTE — Patient Instructions (Signed)
Medication Instructions:  Your physician has recommended you make the following change in your medication:   1) STOP Tikosyn 2) START Amiodarone (Pacerone) '200mg'$  twice daily (with food)  *If you need a refill on your cardiac medications before your next appointment, please call your pharmacy*  Lab Work: NONE  Testing/Procedures: Your physician has recommended that you have a Cardioversion (DCCV). Electrical Cardioversion uses a jolt of electricity to your heart either through paddles or wired patches attached to your chest. This is a controlled, usually prescheduled, procedure. Defibrillation is done under light anesthesia in the hospital, and you usually go home the day of the procedure. This is done to get your heart back into a normal rhythm. You are not awake for the procedure. Please see the instruction sheet given to you today.   Follow-Up: At [facility], you and your health needs are our priority.  As part of our continuing mission to provide you with exceptional heart care, we have created designated Provider Care Teams.  These Care Teams include your primary Cardiologist (physician) and Advanced Practice Providers (APPs -  Physician Assistants and Nurse Practitioners) who all work together to provide you with the care you need, when you need it.  Your next appointment:   2-4 week(s) with atrial fibrillation clinic 6 months with Dr. Myles Gip  Provider:   You will follow up in the [facility] located at [facility]. Your provider will be: [name], NP or Clint R. Fenton, PA-C  In 6 months Doralee Albino, MD  Other Instructions   Dear Maria Fry are scheduled for a Cardioversion on Wednesday, January 31 with Dr. Doralee Albino.  Please arrive at the [facility] (Main Entrance A) at [facility]: [address] [place], [place] [address] at 10:30 AM.   DIET:  Nothing to eat or drink after midnight except a sip of water with  medications (see medication instructions below)  MEDICATION INSTRUCTIONS: Hold all your morning medications on Wednesday January 31st, EXCEPT take your Eliquis.  Continue taking your anticoagulant (blood thinner): Apixaban (Eliquis).  You will need to continue this after your procedure until you are told by your provider that it is safe to stop.    LABS: CBC, BMET Your labs will be done at the hospital prior to your procedure - you will need to arrive 1 and 1/2 hours prior to your procedure.  FYI:  For your safety, and to allow [place] to monitor your vital signs accurately during the surgery/procedure we request: If you have artificial nails, gel coating, SNS etc, please have those removed prior to your surgery/procedure. Not having the nail coverings /polish removed may result in cancellation or delay of your surgery/procedure.  You must have a responsible person to drive you home and stay in the waiting area during your procedure. Failure to do so could result in cancellation.  Bring your insurance cards.  *Special Note: Every effort is made to have your procedure done on time. Occasionally there are emergencies that occur at the hospital that may cause delays. Please be patient if a delay does occur.

## 2022-12-12 NOTE — H&P (View-Only) (Signed)
Electrophysiology Office Note:    Date:  12/12/2022   ID:  Maria Fry, DOB 06-16-1951, MRN [id number]  PCP:  Johnette Abraham, MD   [place] Providers Cardiologist:  Rozann Lesches, MD Electrophysiologist:  Melida Quitter, MD     Referring MD: Oliver Barre, PA   History of Present Illness:    Maria Fry is a 72 y.o. female with a hx listed below, significant for CAD, chronic combined systolic and diastolic CHF, OSA, hypothyroid, persistent atrial fibrillation referred for arrhythmia management.  She was initially diagnosed with atrial fibrillation in Sept 2021.  She has been managed with Tikosyn but has had episodes of breakthrough.  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  She has fatigue and shortness of breath with exertion.  No significant chest pain, dependent edema.  Past Medical History:  Diagnosis Date   Arthritis    Right knee   Atrial fibrillation (HCC)    CAD (coronary artery disease)    a. 06/07/15 NSTEMI/Cath: Dist LAD 100%, mid LAD 10%. Otw nl cors. Nl EF w/ inferoapical AK.   Essential hypertension    Hip pain    History of cardiomyopathy 04/23/2016   a. 04/2016 Echo: EF 45%, Gr2 DD; b. 11/2019 Echo: EF 45-50%, mod LVH. Gr2 DD. Nl RV fxn. Sev dil LA.    Hyperlipidemia    Hypothyroidism    NSTEMI (non-ST elevated myocardial infarction) ([place]) 06/05/2015   Vitamin D deficiency     Past Surgical History:  Procedure Laterality Date   ABDOMINAL HYSTERECTOMY     partial- one ovary remaining   BIOPSY  09/17/2020   Procedure: BIOPSY;  Surgeon: [name], DO;  [facility];  Service: Endoscopy;;   CARDIAC CATHETERIZATION N/A 06/06/2015   Procedure: Left Heart Cath and Coronary Angiography;  Surgeon: Peter M Martinique, MD;  Location: [place] [facility];  Service: Cardiovascular;  Laterality: N/A;   CARDIOVERSION N/A 09/14/2020   Procedure: CARDIOVERSION;  Surgeon: Satira Sark, MD;  Location: [facility];   Service: Cardiovascular;  Laterality: N/A;   CARDIOVERSION N/A 06/20/2022   Procedure: CARDIOVERSION;  Surgeon: Arnoldo Lenis, MD;  Location: [facility] ORS;  Service: Endoscopy;  Laterality: N/A;   CHOLECYSTECTOMY     COLONOSCOPY     COLONOSCOPY N/A 08/13/2017   Procedure: COLONOSCOPY;  Surgeon: Daneil Dolin, MD;  [facility];  Service: Endoscopy;  Laterality: N/A;  8:30 AM   COLONOSCOPY WITH PROPOFOL N/A 01/18/2021   [name]: Multiple tubular adenomas removed, next colonoscopy in 3 years   ESOPHAGOGASTRODUODENOSCOPY N/A 09/11/2016   Procedure: ESOPHAGOGASTRODUODENOSCOPY (EGD);  Surgeon: Daneil Dolin, MD;  [facility];  Service: Endoscopy;  Laterality: N/A;  7:45 am - moved to 10/11 @ 10:30   ESOPHAGOGASTRODUODENOSCOPY (EGD) WITH PROPOFOL N/A 09/17/2020   [name]: Diffuse moderate inflammation characterized by erosions and erythema found in the entire stomach.  Biopsies positive for H. pylori.  Patient has not been treated due to drug allergies.   Heel spurs Left    MALONEY DILATION N/A 09/11/2016   Procedure: Venia Minks DILATION;  Surgeon: Daneil Dolin, MD;  [facility];  Service: Endoscopy;  Laterality: N/A;   POLYPECTOMY  08/13/2017   Procedure: POLYPECTOMY;  Surgeon: Daneil Dolin, MD;  [facility];  Service: Endoscopy;;  colon   POLYPECTOMY  01/18/2021   Procedure: POLYPECTOMY;  Surgeon: Daneil Dolin, MD;  [facility];  Service: Endoscopy;;   SHOULDER ARTHROSCOPY Right    TUBAL LIGATION      Current Medications: No outpatient medications have been marked as taking for the 12/12/22 encounter (Appointment) with Sinclair Alligood, Yetta Barre, MD.     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   Social Determinants of Health   Financial Resource Strain: High Risk (12/09/2022)   Overall Financial Resource Strain (CARDIA)    Difficulty of Paying Living Expenses: Hard  Food Insecurity: No Food Insecurity (12/09/2022)   Hunger Vital Sign    Worried About Running Out of Food in the Last Year: Never true    Ran Out of Food in the Last Year: Never true  Transportation Needs: No Transportation Needs (12/09/2022)   PRAPARE - Hydrologist (Medical): No    Lack of Transportation (Non-Medical): No  Physical Activity: Inactive (12/09/2022)   Exercise Vital Sign    Days of Exercise per Week: 0 days    Minutes of Exercise per Session: 0 min  Stress: No Stress Concern Present (12/09/2022)   [place]    Feeling of Stress : Not at all  Social Connections: Moderately Integrated (12/09/2022)   Social Connection and Isolation Panel [NHANES]    Frequency of Communication with Friends and Family: More than three times a week    Frequency of Social Gatherings with Friends and Family: Once a week    Attends Religious Services: 1 to 4 times per year    Active Member of [employer] or Organizations: No    Attends Programme researcher, broadcasting/film/video: Never    Marital Status: Living with partner     Family History: The patient's family history includes Arthritis in her daughter; COPD in her daughter; Cancer in her brother; Heart attack in her brother and father; Heart failure in her brother and father; Lupus in her daughter; Stroke in her sister. There is no history of Colon cancer.  ROS:   Please see the history of present illness.    All other systems reviewed and are negative.  EKGs/Labs/Other Studies Reviewed Today:     TTE: EF 55%, severe LVH  EKG:  Last EKG results: atypical atrial flutter, Left and right atria are normal in size   Recent Labs: 06/18/2022: B Natriuretic Peptide 305.0 07/17/2022: ALT 11; TSH 2.120 09/05/2022: Hemoglobin 9.4; Platelets 378 11/15/2022: BUN 15; Creatinine, Ser  0.92; Magnesium 2.2; Potassium 4.4; Sodium 136     Physical Exam:    VS:  There were no vitals taken for this visit.    Wt Readings from Last 3 Encounters:  12/09/22 220 lb (99.8 kg)  11/15/22 216 lb 6.4 oz (98.2 kg)  11/14/22 217 lb 3.2 oz (98.5 kg)    GEN:  Well nourished, well developed in no acute distress CARDIAC: iRRR, no murmurs, rubs, gallops RESPIRATORY:  Normal work of breathing MUSCULOSKELETAL: no edema    ASSESSMENT & PLAN:    Persistent atrial fibrillation/atypical atrial flutter: will transition to amiodarone. Ablation may be difficult with what appears to be an atypical atrial flutter. Will load with 200 BID for 2 weeks, then plan for DC cardioversion. I encouraged weight loss. With weight loss may consider ablation in the future. Secondary hypercoagulable state: CHADs2Vasc 5.  CHFrEF        Medication Adjustments/Labs and Tests Ordered: Current medicines are reviewed at length with the patient today.  Concerns regarding medicines are outlined above.  No orders of the defined types were placed in this encounter.  No orders of the defined types were placed in this  encounter.    Signed, Melida Quitter, MD  12/12/2022 12:19 PM    [place]

## 2022-12-19 LAB — TSH: TSH: 4.05 u[IU]/mL (ref 0.450–4.500)

## 2022-12-19 LAB — LIPID PANEL
Chol/HDL Ratio: 2.7 ratio (ref 0.0–4.4)
Cholesterol, Total: 96 mg/dL — ABNORMAL LOW (ref 100–199)
HDL: 36 mg/dL — ABNORMAL LOW (ref >39)
LDL Chol Calc (NIH): 45 mg/dL (ref 0–99)
Triglycerides: 71 mg/dL (ref 0–149)
VLDL Cholesterol Cal: 15 mg/dL (ref 5–40)

## 2022-12-19 LAB — T4, FREE: Free T4: 1.61 ng/dL (ref 0.82–1.77)

## 2022-12-19 LAB — VITAMIN D 25 HYDROXY (VIT D DEFICIENCY, FRACTURES): Vit D, 25-Hydroxy: 20.3 ng/mL — ABNORMAL LOW (ref 30.0–100.0)

## 2022-12-31 NOTE — Anesthesia Preprocedure Evaluation (Signed)
Anesthesia Evaluation  Patient identified by MRN, date of birth, ID band Patient awake    Reviewed: Allergy & Precautions, NPO status , Patient's Chart, lab work & pertinent test results  Airway Mallampati: II  TM Distance: >3 FB Neck ROM: Full    Dental no notable dental hx. (+) Edentulous Upper   Pulmonary sleep apnea and Continuous Positive Airway Pressure Ventilation , former smoker   Pulmonary exam normal breath sounds clear to auscultation       Cardiovascular hypertension, + CAD and + Past MI  Normal cardiovascular exam+ dysrhythmias Atrial Fibrillation  Rhythm:Irregular Rate:Abnormal  06/2022 Echo 1. Left ventricular ejection fraction, by estimation, is 55%. The left  ventricle has normal function. Left ventricular endocardial border not  optimally defined to evaluate regional wall motion. There is severe left  ventricular hypertrophy.   2. Right ventricular systolic function is normal. The right ventricular  size is normal.   3. The aortic valve is tricuspid. There is mild calcification of the  aortic valve. There is mild thickening of the aortic valve.   4. IVC is small suggesting low RA pressure and hypovolemia.   5. Limited echo to evaluate LV function     Neuro/Psych    GI/Hepatic   Endo/Other    Renal/GU      Musculoskeletal   Abdominal  (+) + obese (BMI 38.44)  Peds  Hematology   Anesthesia Other Findings All: See list  Reproductive/Obstetrics                             Anesthesia Physical Anesthesia Plan  ASA: 3  Anesthesia Plan: General   Post-op Pain Management: Minimal or no pain anticipated   Induction: Intravenous  PONV Risk Score and Plan: Treatment may vary due to age or medical condition  Airway Management Planned: Mask  Additional Equipment: None  Intra-op Plan:   Post-operative Plan:   Informed Consent: I have reviewed the patients History and  Physical, chart, labs and discussed the procedure including the risks, benefits and alternatives for the proposed anesthesia with the patient or authorized representative who has indicated his/her understanding and acceptance.     Dental advisory given  Plan Discussed with: CRNA, Anesthesiologist and Surgeon  Anesthesia Plan Comments:         Anesthesia Quick Evaluation

## 2023-01-01 ENCOUNTER — Ambulatory Visit (HOSPITAL_BASED_OUTPATIENT_CLINIC_OR_DEPARTMENT_OTHER): Payer: Medicare Other | Admitting: Anesthesiology

## 2023-01-01 ENCOUNTER — Encounter (HOSPITAL_COMMUNITY): Payer: Self-pay | Admitting: Cardiology

## 2023-01-01 ENCOUNTER — Ambulatory Visit (HOSPITAL_COMMUNITY)
Admission: RE | Admit: 2023-01-01 | Discharge: 2023-01-01 | Disposition: A | Discharge status: Home / Self Care | Payer: Medicare Other | Attending: Cardiology | Admitting: Cardiology

## 2023-01-01 ENCOUNTER — Other Ambulatory Visit: Payer: Self-pay

## 2023-01-01 ENCOUNTER — Ambulatory Visit (HOSPITAL_COMMUNITY): Payer: Medicare Other | Admitting: Anesthesiology

## 2023-01-01 ENCOUNTER — Encounter (HOSPITAL_COMMUNITY)
Admission: RE | Disposition: A | Discharge status: Home / Self Care | Payer: Self-pay | Source: Home / Self Care | Attending: Cardiology

## 2023-01-01 DIAGNOSIS — I5042 Chronic combined systolic (congestive) and diastolic (congestive) heart failure: Secondary | ICD-10-CM | POA: Diagnosis not present

## 2023-01-01 DIAGNOSIS — I484 Atypical atrial flutter: Secondary | ICD-10-CM | POA: Diagnosis not present

## 2023-01-01 DIAGNOSIS — I2510 Atherosclerotic heart disease of native coronary artery without angina pectoris: Secondary | ICD-10-CM | POA: Diagnosis not present

## 2023-01-01 DIAGNOSIS — Z6838 Body mass index (BMI) 38.0-38.9, adult: Secondary | ICD-10-CM | POA: Diagnosis not present

## 2023-01-01 DIAGNOSIS — I10 Essential (primary) hypertension: Secondary | ICD-10-CM | POA: Diagnosis not present

## 2023-01-01 DIAGNOSIS — E039 Hypothyroidism, unspecified: Secondary | ICD-10-CM | POA: Diagnosis not present

## 2023-01-01 DIAGNOSIS — Z87891 Personal history of nicotine dependence: Secondary | ICD-10-CM

## 2023-01-01 DIAGNOSIS — I110 Hypertensive heart disease with heart failure: Secondary | ICD-10-CM | POA: Diagnosis not present

## 2023-01-01 DIAGNOSIS — I4819 Other persistent atrial fibrillation: Secondary | ICD-10-CM

## 2023-01-01 DIAGNOSIS — G4733 Obstructive sleep apnea (adult) (pediatric): Secondary | ICD-10-CM | POA: Diagnosis not present

## 2023-01-01 DIAGNOSIS — I252 Old myocardial infarction: Secondary | ICD-10-CM

## 2023-01-01 DIAGNOSIS — E669 Obesity, unspecified: Secondary | ICD-10-CM | POA: Diagnosis not present

## 2023-01-01 DIAGNOSIS — I4891 Unspecified atrial fibrillation: Secondary | ICD-10-CM | POA: Diagnosis not present

## 2023-01-01 DIAGNOSIS — I482 Chronic atrial fibrillation, unspecified: Secondary | ICD-10-CM

## 2023-01-01 HISTORY — PX: CARDIOVERSION: SHX1299

## 2023-01-01 LAB — POCT I-STAT, CHEM 8
BUN: 28 mg/dL — ABNORMAL HIGH (ref 8–23)
Calcium, Ion: 1.12 mmol/L — ABNORMAL LOW (ref 1.15–1.40)
Chloride: 104 mmol/L (ref 98–111)
Creatinine, Ser: 1.3 mg/dL — ABNORMAL HIGH (ref 0.44–1.00)
Glucose, Bld: 100 mg/dL — ABNORMAL HIGH (ref 70–99)
HCT: 31 % — ABNORMAL LOW (ref 36.0–46.0)
Hemoglobin: 10.5 g/dL — ABNORMAL LOW (ref 12.0–15.0)
Potassium: 4.4 mmol/L (ref 3.5–5.1)
Sodium: 138 mmol/L (ref 135–145)
TCO2: 26 mmol/L (ref 22–32)

## 2023-01-01 SURGERY — CARDIOVERSION
Anesthesia: General

## 2023-01-01 MED ORDER — LIDOCAINE 2% (20 MG/ML) 5 ML SYRINGE
INTRAMUSCULAR | Status: DC | PRN
  Administered 2023-01-01: 60 mg via INTRAVENOUS

## 2023-01-01 MED ORDER — PROPOFOL 10 MG/ML IV BOLUS
INTRAVENOUS | Status: DC | PRN
  Administered 2023-01-01: 60 mg via INTRAVENOUS

## 2023-01-01 MED ORDER — SODIUM CHLORIDE 0.9 % IV SOLN: INTRAVENOUS | Status: DC

## 2023-01-01 NOTE — Anesthesia Postprocedure Evaluation (Signed)
Anesthesia Post Note  Patient: Maria Fry  Procedure(s) Performed: CARDIOVERSION     Patient location during evaluation: Endoscopy Anesthesia Type: General Level of consciousness: awake and alert Pain management: pain level controlled Vital Signs Assessment: post-procedure vital signs reviewed and stable Respiratory status: spontaneous breathing, nonlabored ventilation, respiratory function stable and patient connected to nasal cannula oxygen Cardiovascular status: blood pressure returned to baseline and stable Postop Assessment: no apparent nausea or vomiting Anesthetic complications: no  No notable events documented.  Last Vitals:  Vitals:   01/01/23 1223 01/01/23 1230  BP: 120/65 121/67  Pulse: (!) 57 63  Resp: (!) 23 20  Temp:    SpO2: 98% 94%    Last Pain:  Vitals:   01/01/23 1230  TempSrc:   PainSc: 0-No pain                 Barnet Glasgow

## 2023-01-01 NOTE — Discharge Instructions (Signed)

## 2023-01-01 NOTE — CV Procedure (Signed)
Procedure:   DCCV  Indication:  Symptomatic atrial fibrillation/flutter  Procedure Note:  The patient signed informed consent.  They have had had therapeutic anticoagulation with apixaban greater than 3 weeks.  Anesthesia was administered by Dr. Valma Cava.  Patient received 60 mg IV lidocaine and 60 mg IV propofol.Adequate airway was maintained throughout and vital followed per protocol.  They were cardioverted x 3 with 150, 200, 200J of biphasic synchronized energy with anterior pressure.  They converted to sinus bradycardia.  There were no apparent complications.  The patient had normal neuro status and respiratory status post procedure with vitals stable as recorded elsewhere.    Follow up:  They will continue on current medical therapy and follow up with cardiology as scheduled.  Buford Dresser, MD PhD 01/01/2023 12:21 PM

## 2023-01-01 NOTE — Interval H&P Note (Signed)
History and Physical Interval Note:  01/01/2023 11:29 AM  Maria Fry  has presented today for surgery, with the diagnosis of AFIB.  The various methods of treatment have been discussed with the patient and family. After consideration of risks, benefits and other options for treatment, the patient has consented to  Procedure(s): CARDIOVERSION (N/A) as a surgical intervention.  The patient's history has been reviewed, patient examined, no change in status, stable for surgery.  I have reviewed the patient's chart and labs.  Questions were answered to the patient's satisfaction.     Gunther Zawadzki Harrell Gave

## 2023-01-01 NOTE — Transfer of Care (Signed)
Immediate Anesthesia Transfer of Care Note  Patient: Maria Fry  Procedure(s) Performed: CARDIOVERSION  Patient Location: Endoscopy Unit  Anesthesia Type:General  Level of Consciousness: drowsy and patient cooperative  Airway & Oxygen Therapy: Patient Spontanous Breathing  Post-op Assessment: Report given to RN, Post -op Vital signs reviewed and stable, and Patient moving all extremities X 4  Post vital signs: Reviewed and stable  Last Vitals:  Vitals Value Taken Time  BP 117/66   Temp    Pulse 60   Resp 16   SpO2 100     Last Pain:  Vitals:   01/01/23 1036  TempSrc: Tympanic  PainSc: 0-No pain         Complications: No notable events documented.

## 2023-01-02 ENCOUNTER — Encounter (HOSPITAL_COMMUNITY): Payer: Self-pay | Admitting: Cardiology

## 2023-01-09 ENCOUNTER — Ambulatory Visit (HOSPITAL_COMMUNITY): Payer: Medicare Other | Admitting: Physician Assistant

## 2023-01-09 ENCOUNTER — Encounter (HOSPITAL_COMMUNITY): Payer: Self-pay | Admitting: *Deleted

## 2023-01-10 ENCOUNTER — Ambulatory Visit (HOSPITAL_COMMUNITY)
Admission: RE | Admit: 2023-01-10 | Discharge: 2023-01-10 | Disposition: A | Discharge status: Home / Self Care | Payer: Medicare Other | Source: Ambulatory Visit | Attending: Nurse Practitioner | Admitting: Nurse Practitioner

## 2023-01-10 VITALS — BP 132/68 | HR 42 | Ht 63.0 in | Wt 216.2 lb

## 2023-01-10 DIAGNOSIS — I110 Hypertensive heart disease with heart failure: Secondary | ICD-10-CM | POA: Diagnosis not present

## 2023-01-10 DIAGNOSIS — I2510 Atherosclerotic heart disease of native coronary artery without angina pectoris: Secondary | ICD-10-CM | POA: Diagnosis not present

## 2023-01-10 DIAGNOSIS — I4819 Other persistent atrial fibrillation: Secondary | ICD-10-CM | POA: Diagnosis not present

## 2023-01-10 DIAGNOSIS — D6869 Other thrombophilia: Secondary | ICD-10-CM | POA: Insufficient documentation

## 2023-01-10 DIAGNOSIS — Z6838 Body mass index (BMI) 38.0-38.9, adult: Secondary | ICD-10-CM | POA: Insufficient documentation

## 2023-01-10 DIAGNOSIS — Z7901 Long term (current) use of anticoagulants: Secondary | ICD-10-CM | POA: Insufficient documentation

## 2023-01-10 DIAGNOSIS — I5042 Chronic combined systolic (congestive) and diastolic (congestive) heart failure: Secondary | ICD-10-CM | POA: Insufficient documentation

## 2023-01-10 DIAGNOSIS — E669 Obesity, unspecified: Secondary | ICD-10-CM | POA: Insufficient documentation

## 2023-01-10 DIAGNOSIS — G4733 Obstructive sleep apnea (adult) (pediatric): Secondary | ICD-10-CM | POA: Insufficient documentation

## 2023-01-10 MED ORDER — AMIODARONE HCL 200 MG PO TABS: 200.0000 mg | ORAL_TABLET | Freq: Every day | ORAL | 1 refills | Status: DC

## 2023-01-10 NOTE — Progress Notes (Signed)
Primary Care Physician: Johnette Abraham, MD Primary Cardiologist: Dr Domenic Polite  Primary Electrophysiologist: Dr Myles Gip Referring Physician: Levell July   Maria Fry is a 72 y.o. female with a history of CAD, chronic combined systolic and diastolic CHF, HLD, HTN, OSA, hypothyroidism, and persistent atrial fibrillation who presents for follow up in the [facility]. The patient was initially diagnosed with atrial fibrillation 08/18/20 at a routine follow up with Rosaria Ferries. She was having symptoms of increased fatigue. She was started on Eliquis for a CHADS2VASC score of 5. She underwent DCCV on 09/14/20. She was hospitalized 10/15-10/17/21 for acute CHF and black stools. Workup revealed gastritis and her Eliquis was temporarily held. She resumed 09/17/20. Her ASA was discontinued and she has not had recurrence of bleeding. She was back in afib on follow up 10/02/20. She reports that she did feel better in SR with more energy and less SOB.  Pt is s/p dofetilide loading 11/9-11/14/21. She converted to sinus rhythm after the first dose however, she was noted to have significant widening of QTC out to 551 ms. Dofetilide was held for 24 hours and subsequently resumed at 125 mcg twice daily after QT normalized.    Patient presented to the ED 06/18/22 with chest pain. She was found to be in afib and underwent DCCV on 06/20/22. She was sent to the ED 09/05/22 for lightheadedness and fatigue, found to be in rate controlled afib. She did not undergo DCCV at that time. She wore a preventice event monitor which showed only two possible episodes of afib, low burden.   On follow up today, dofetilide was discontinued and she was loaded on amiodarone. Now s/p DCCV on 01/01/23. Patient reports that she has continued to feel fatigued since her DCCV, especially with exertion. No bleeding issues on anticoagulation.   Today, she denies symptoms of palpitations, chest pain, orthopnea, PND,  dizziness, presyncope, syncope, bleeding, or neurologic sequela. The patient is tolerating medications without difficulties and is otherwise without complaint today.    Atrial Fibrillation Risk Factors:  she does have symptoms or diagnosis of sleep apnea. Patient reports intermittent compliance with CPAP. she does not have a history of rheumatic fever.   she has a BMI of Body mass index is 38.3 kg/m.Marland Kitchen Filed Weights   01/10/23 0845  Weight: 98.1 kg    Family History  Problem Relation Age of Onset   Heart failure Father        Deceased   Heart attack Father        Deceased   Stroke Sister        Deceased   Heart failure Brother        Deceased   Cancer Brother        Deceased   Heart attack Brother        Deceased   COPD Daughter    Arthritis Daughter    Lupus Daughter    Colon cancer Neg Hx      Atrial Fibrillation Management history:  Previous antiarrhythmic drugs: dofetilide, amiodarone   Previous cardioversions: 09/14/20, 06/20/22,  Previous ablations: none CHADS2VASC score: 5 Anticoagulation history: Eliquis   Past Medical History:  Diagnosis Date   Arthritis    Right knee   Atrial fibrillation (HCC)    CAD (coronary artery disease)    a. 06/07/15 NSTEMI/Cath: Dist LAD 100%, mid LAD 10%. Otw nl cors. Nl EF w/ inferoapical AK.   Essential hypertension    Hip pain  History of cardiomyopathy 04/23/2016   a. 04/2016 Echo: EF 45%, Gr2 DD; b. 11/2019 Echo: EF 45-50%, mod LVH. Gr2 DD. Nl RV fxn. Sev dil LA.    Hyperlipidemia    Hypothyroidism    NSTEMI (non-ST elevated myocardial infarction) ([place]) 06/05/2015   Vitamin D deficiency    Past Surgical History:  Procedure Laterality Date   ABDOMINAL HYSTERECTOMY     partial- one ovary remaining   BIOPSY  09/17/2020   Procedure: BIOPSY;  Surgeon: [name], DO;  [facility];  Service: Endoscopy;;   CARDIAC CATHETERIZATION N/A 06/06/2015   Procedure: Left Heart Cath and Coronary Angiography;   Surgeon: Peter M Martinique, MD;  Location: [place] [facility];  Service: Cardiovascular;  Laterality: N/A;   CARDIOVERSION N/A 09/14/2020   Procedure: CARDIOVERSION;  Surgeon: Satira Sark, MD;  Location: [facility];  Service: Cardiovascular;  Laterality: N/A;   CARDIOVERSION N/A 06/20/2022   Procedure: CARDIOVERSION;  Surgeon: Arnoldo Lenis, MD;  Location: [facility];  Service: Endoscopy;  Laterality: N/A;   CARDIOVERSION N/A 01/01/2023   Procedure: CARDIOVERSION;  Surgeon: Buford Dresser, MD;  [facility];  Service: Cardiovascular;  Laterality: N/A;   CHOLECYSTECTOMY     COLONOSCOPY     COLONOSCOPY N/A 08/13/2017   Procedure: COLONOSCOPY;  Surgeon: Daneil Dolin, MD;  [facility];  Service: Endoscopy;  Laterality: N/A;  8:30 AM   COLONOSCOPY WITH PROPOFOL N/A 01/18/2021   [name]: Multiple tubular adenomas removed, next colonoscopy in 3 years   ESOPHAGOGASTRODUODENOSCOPY N/A 09/11/2016   Procedure: ESOPHAGOGASTRODUODENOSCOPY (EGD);  Surgeon: Daneil Dolin, MD;  [facility];  Service: Endoscopy;  Laterality: N/A;  7:45 am - moved to 10/11 @ 10:30   ESOPHAGOGASTRODUODENOSCOPY (EGD) WITH PROPOFOL N/A 09/17/2020   [name]: Diffuse moderate inflammation characterized by erosions and erythema found in the entire stomach.  Biopsies positive for H. pylori.  Patient has not been treated due to drug allergies.   Heel spurs Left    MALONEY DILATION N/A 09/11/2016   Procedure: Venia Minks DILATION;  Surgeon: Daneil Dolin, MD;  [facility];  Service: Endoscopy;  Laterality: N/A;   POLYPECTOMY  08/13/2017   Procedure: POLYPECTOMY;  Surgeon: Daneil Dolin, MD;  [facility];  Service: Endoscopy;;  colon   POLYPECTOMY  01/18/2021   Procedure: POLYPECTOMY;  Surgeon: Daneil Dolin, MD;  [facility];  Service: Endoscopy;;   SHOULDER ARTHROSCOPY Right    TUBAL LIGATION      Current Outpatient Medications  Medication Sig  Dispense Refill   acetaminophen (TYLENOL) 500 MG tablet Take 500 mg by mouth 2 (two) times daily.     amiodarone (PACERONE) 200 MG tablet Take 1 tablet (200 mg total) by mouth 2 (two) times daily. 180 tablet 3   amLODipine (NORVASC) 2.5 MG tablet Take 1 tablet (2.5 mg total) by mouth daily. 90 tablet 3   atorvastatin (LIPITOR) 80 MG tablet Take 1 tablet (80 mg total) by mouth at bedtime. 90 tablet 3   calcium carbonate (OS-CAL - DOSED IN MG OF ELEMENTAL CALCIUM) 1250 (500 Ca) MG tablet Take 500 mg by mouth daily.     Cholecalciferol (VITAMIN D3) 50 MCG (2000 UT) capsule Take 1 capsule (2,000 Units total) by mouth daily. 30 capsule 3   diclofenac Sodium (VOLTAREN) 1 % GEL Apply 1 application topically 4 (four) times daily as needed (pain).     ELIQUIS 5 MG TABS tablet Take  1 tablet by mouth twice daily 60 tablet 3   ferrous sulfate 325 (65 FE) MG EC tablet Take 1 tablet (325 mg total) by mouth daily. (Patient taking differently: Take 325 mg by mouth 4 (four) times a week.) 60 tablet 1   furosemide (LASIX) 40 MG tablet Take 1 tablet (40 mg total) by mouth daily.     gabapentin (NEURONTIN) 300 MG capsule Take 1 capsule (300 mg total) by mouth 2 (two) times daily. 60 capsule 2   levothyroxine (SYNTHROID) 100 MCG tablet Take 1 tablet (100 mcg total) by mouth daily before breakfast. 90 tablet 1   magnesium oxide (MAG-OX) 400 MG tablet Take 0.5 tablets (200 mg total) by mouth daily. 30 tablet 6   melatonin 3 MG TABS tablet Take 3 mg by mouth at bedtime as needed (Sleep).     metoprolol tartrate (LOPRESSOR) 25 MG tablet TAKE ONE-HALF TABLET BY MOUTH TWO TIMES DAILY 90 tablet 3   nitroGLYCERIN (NITROSTAT) 0.4 MG SL tablet DISSOLVE ONE TABLET UNDER THE TONGUE EVERY 5 MINUTES AS NEEDED FOR CHEST PAIN.  DO NOT EXCEED A TOTAL OF 3 DOSES IN 15 MINUTES (Patient taking differently: DISSOLVE ONE TABLET UNDER THE TONGUE EVERY 5 MINUTES AS NEEDED FOR CHEST PAIN.  DO NOT EXCEED A TOTAL OF 3 DOSES IN 15 MINUTES) 25  tablet 3   ondansetron (ZOFRAN-ODT) 4 MG disintegrating tablet Take 1 tablet (4 mg total) by mouth every 8 (eight) hours as needed for nausea or vomiting. 20 tablet 0   potassium chloride SA (KLOR-CON M) 20 MEQ tablet Take 2 tablets (40 mEq total) by mouth daily.     spironolactone (ALDACTONE) 25 MG tablet Take 1/2 (one-half) tablet by mouth once daily 45 tablet 2   vitamin C (ASCORBIC ACID) 500 MG tablet Take 500 mg by mouth daily.     No current facility-administered medications for this encounter.    Allergies  Allergen Reactions   Afrin [Oxymetazoline] Anaphylaxis   Amoxicillin Anaphylaxis   Flonase [Fluticasone] Anaphylaxis   Lisinopril Anaphylaxis   Ranolazine Anaphylaxis    Throat swells, Dizzy, Reaction occurred with generic med   Vicodin [Hydrocodone-Acetaminophen] Diarrhea and Nausea And Vomiting   Demerol [Meperidine] Nausea And Vomiting   Lodine [Etodolac] Rash    Social History   Socioeconomic History   Marital status: Single    Spouse name: Not on file   Number of children: 3   Years of education: 10   Highest education level: Not on file  Occupational History   Occupation: team leader at [employer]  Tobacco Use   Smoking status: Former    Packs/day: 1.00    Years: 20.00    Total pack years: 20.00    Types: Cigarettes    Start date: 12/02/1977    Quit date: 12/02/2002    Years since quitting: 20.1   Smokeless tobacco: Never   Tobacco comments:    Former smoker 05/10/22  Vaping Use   Vaping Use: Never used  Substance and Sexual Activity   Alcohol use: No    Alcohol/week: 0.0 standard drinks of alcohol   Drug use: No   Sexual activity: Not Currently  Other Topics Concern   Not on file  Social History Narrative   Retired from [employer].   Social Determinants of Health   Financial Resource Strain: High Risk (12/09/2022)   Overall Financial Resource Strain (CARDIA)    Difficulty of Paying Living Expenses: Hard  Food Insecurity: No Food  Insecurity (12/09/2022)   Hunger  Vital Sign    Worried About Charity fundraiser in the Last Year: Never true    Ran Out of Food in the Last Year: Never true  Transportation Needs: No Transportation Needs (12/09/2022)   PRAPARE - Hydrologist (Medical): No    Lack of Transportation (Non-Medical): No  Physical Activity: Inactive (12/09/2022)   Exercise Vital Sign    Days of Exercise per Week: 0 days    Minutes of Exercise per Session: 0 min  Stress: No Stress Concern Present (12/09/2022)   [place]    Feeling of Stress : Not at all  Social Connections: Moderately Integrated (12/09/2022)   Social Connection and Isolation Panel [NHANES]    Frequency of Communication with Friends and Family: More than three times a week    Frequency of Social Gatherings with Friends and Family: Once a week    Attends Religious Services: 1 to 4 times per year    Active Member of [employer] or Organizations: No    Attends Archivist Meetings: Never    Marital Status: Living with partner  Intimate Partner Violence: Not At Risk (12/09/2022)   Humiliation, Afraid, Rape, and Kick questionnaire    Fear of Current or Ex-Partner: No    Emotionally Abused: No    Physically Abused: No    Sexually Abused: No     ROS- All systems are reviewed and negative except as per the HPI above.  Physical Exam: Vitals:   01/10/23 0845  BP: 132/68  Pulse: (!) 42  Weight: 98.1 kg  Height: 5' 3"$  (1.6 m)    GEN- The patient is a well appearing obese female, alert and oriented x 3 today.   HEENT-head normocephalic, atraumatic, sclera clear, conjunctiva pink, hearing intact, trachea midline. Lungs- Clear to ausculation bilaterally, normal work of breathing Heart- Regular rate and rhythm, bradycardia, no murmurs, rubs or gallops  GI- soft, NT, ND, + BS Extremities- no clubbing, cyanosis, or edema MS- no significant deformity or  atrophy Skin- no rash or lesion Psych- euthymic mood, full affect Neuro- strength and sensation are intact   Wt Readings from Last 3 Encounters:  01/10/23 98.1 kg  01/01/23 98.4 kg  12/12/22 98.4 kg    EKG today demonstrates  SB, 1st degree AV block, NST Vent. rate 42 BPM PR interval 206 ms QRS duration 102 ms QT/QTcB 514/429 ms  Echo 06/19/22 demonstrated   1. Left ventricular ejection fraction, by estimation, is 55%. The left  ventricle has normal function. Left ventricular endocardial border not  optimally defined to evaluate regional wall motion. There is severe left  ventricular hypertrophy.   2. Right ventricular systolic function is normal. The right ventricular  size is normal.   3. The aortic valve is tricuspid. There is mild calcification of the  aortic valve. There is mild thickening of the aortic valve.   4. IVC is small suggesting low RA pressure and hypovolemia.   5. Limited echo to evaluate LV function   Epic records are reviewed at length today  CHA2DS2-VASc Score = 5  The patient's score is based upon: CHF History: 1 HTN History: 1 Diabetes History: 0 Stroke History: 0 Vascular Disease History: 1 Age Score: 1 Gender Score: 1       ASSESSMENT AND PLAN: 1. Persistent Atrial Fibrillation (ICD10:  I48.19) The patient's CHA2DS2-VASc score is 5, indicating a 7.2% annual risk of stroke.   Failed dofetilide, loaded  on amiodarone. S/p DCCV 01/01/23 Patient appears to be maintaining SR but having symptomatic bradycardia.  Will decrease amiodarone to 200 mg daily Continue Eliquis 5 mg BID Continue Lopressor 12.5 mg BID, would prefer to keep her on BB with h/o CHF and CAD but may consider discontinuing if her bradycardia does not improve.   2. Secondary Hypercoagulable State (ICD10:  D68.69) The patient is at significant risk for stroke/thromboembolism based upon her CHA2DS2-VASc Score of 5.  Continue Apixaban (Eliquis).   3. Obesity Body mass index is  38.3 kg/m. Lifestyle modification was discussed and encouraged including regular physical activity and weight reduction.  4. OSA The importance of adequate treatment of sleep apnea was discussed today in order to improve our ability to maintain sinus rhythm long term. Patient has an appointment with Dr Halford Chessman to discuss mask leakage issues as well as alternative treatments for her OSA.   5. Chronic combined CHF EF 55% Appears euvolemic today.   6. CAD No anginal symptoms.  7. HTN Stable, no changes today.   Follow up in the AF clinic in 2-3 weeks.    [facility] [address] [place], [place] [address] [phone]

## 2023-01-10 NOTE — Patient Instructions (Signed)
Decrease amiodarone to 200mg once a day 

## 2023-01-13 ENCOUNTER — Other Ambulatory Visit (HOSPITAL_COMMUNITY): Payer: Self-pay | Admitting: Physician Assistant

## 2023-01-15 ENCOUNTER — Ambulatory Visit: Payer: Medicare Other | Admitting: Diagnostic Neuroimaging

## 2023-01-15 ENCOUNTER — Encounter: Payer: Self-pay | Admitting: Diagnostic Neuroimaging

## 2023-01-15 ENCOUNTER — Telehealth: Payer: Self-pay | Admitting: Diagnostic Neuroimaging

## 2023-01-15 VITALS — BP 146/72 | HR 51 | Ht 63.0 in | Wt 217.4 lb

## 2023-01-15 DIAGNOSIS — G5711 Meralgia paresthetica, right lower limb: Secondary | ICD-10-CM

## 2023-01-15 DIAGNOSIS — M5441 Lumbago with sciatica, right side: Secondary | ICD-10-CM

## 2023-01-15 DIAGNOSIS — M48062 Spinal stenosis, lumbar region with neurogenic claudication: Secondary | ICD-10-CM | POA: Diagnosis not present

## 2023-01-15 DIAGNOSIS — M5442 Lumbago with sciatica, left side: Secondary | ICD-10-CM

## 2023-01-15 DIAGNOSIS — G8929 Other chronic pain: Secondary | ICD-10-CM

## 2023-01-15 NOTE — Progress Notes (Signed)
[facility]  PATIENT: HOLLIANNE Fry DOB: 1951/01/30  REFERRING CLINICIAN: Johnette Abraham, MD HISTORY FROM: patient  REASON FOR VISIT: new consult    HISTORICAL  CHIEF COMPLAINT:  Chief Complaint  Patient presents with   New Patient (Initial Visit)    Patient in room #7 and alone. Patient here today to discuss meralgia paresthetica.    HISTORY OF PRESENT ILLNESS:   72 year old female here for evaluation of meralgia paresthetica.  Patient has had intermittent right thigh numbness greater than left side since 2000.  Diagnosed with meralgia paresthetica treated with gabapentin and injections.  Symptoms worse with standing.  In last few years having increasing low back pain with standing and walking.  Sometimes her legs feel weak and give out.  Sometimes she has to stop walking.  She has not had any imaging of the lower spine.  She has tried physical therapy and [employer] exercises without benefit.  She has had some issues with her heart which have limited her exercise capacity.   REVIEW OF SYSTEMS: Full 14 system review of systems performed and negative with exception of: as per HPI.  ALLERGIES: Allergies  Allergen Reactions   Afrin [Oxymetazoline] Anaphylaxis   Amoxicillin Anaphylaxis   Flonase [Fluticasone] Anaphylaxis   Lisinopril Anaphylaxis   Ranolazine Anaphylaxis    Throat swells, Dizzy, Reaction occurred with generic med   Vicodin [Hydrocodone-Acetaminophen] Diarrhea and Nausea And Vomiting   Demerol [Meperidine] Nausea And Vomiting   Lodine [Etodolac] Rash    HOME MEDICATIONS: Outpatient Medications Prior to Visit  Medication Sig Dispense Refill   acetaminophen (TYLENOL) 500 MG tablet Take 500 mg by mouth 2 (two) times daily.     amiodarone (PACERONE) 200 MG tablet Take 1 tablet (200 mg total) by mouth daily. 90 tablet 1   amLODipine (NORVASC) 2.5 MG tablet Take 1 tablet (2.5 mg total) by mouth daily. 90 tablet 3   apixaban (ELIQUIS) 5 MG TABS  tablet Take 1 tablet by mouth twice daily 60 tablet 6   atorvastatin (LIPITOR) 80 MG tablet Take 1 tablet (80 mg total) by mouth at bedtime. 90 tablet 3   calcium carbonate (OS-CAL - DOSED IN MG OF ELEMENTAL CALCIUM) 1250 (500 Ca) MG tablet Take 500 mg by mouth daily.     Cholecalciferol (VITAMIN D3) 50 MCG (2000 UT) capsule Take 1 capsule (2,000 Units total) by mouth daily. 30 capsule 3   clopidogrel (PLAVIX) 75 MG tablet Take 75 mg by mouth daily.     diclofenac Sodium (VOLTAREN) 1 % GEL Apply 1 application topically 4 (four) times daily as needed (pain).     ergocalciferol (VITAMIN D2) 1.25 MG (50000 UT) capsule Take 50,000 Units by mouth once a week.     ferrous sulfate 325 (65 FE) MG EC tablet Take 1 tablet (325 mg total) by mouth daily. (Patient taking differently: Take 325 mg by mouth 4 (four) times a week.) 60 tablet 1   furosemide (LASIX) 40 MG tablet Take 1 tablet (40 mg total) by mouth daily.     gabapentin (NEURONTIN) 300 MG capsule Take 1 capsule (300 mg total) by mouth 2 (two) times daily. 60 capsule 2   levothyroxine (SYNTHROID) 100 MCG tablet Take 1 tablet (100 mcg total) by mouth daily before breakfast. 90 tablet 1   magnesium oxide (MAG-OX) 400 MG tablet Take 0.5 tablets (200 mg total) by mouth daily. 30 tablet 6   melatonin 3 MG TABS tablet Take 3 mg by mouth at bedtime as  needed (Sleep).     metoprolol tartrate (LOPRESSOR) 25 MG tablet TAKE ONE-HALF TABLET BY MOUTH TWO TIMES DAILY 90 tablet 3   nitroGLYCERIN (NITROSTAT) 0.4 MG SL tablet DISSOLVE ONE TABLET UNDER THE TONGUE EVERY 5 MINUTES AS NEEDED FOR CHEST PAIN.  DO NOT EXCEED A TOTAL OF 3 DOSES IN 15 MINUTES (Patient taking differently: DISSOLVE ONE TABLET UNDER THE TONGUE EVERY 5 MINUTES AS NEEDED FOR CHEST PAIN.  DO NOT EXCEED A TOTAL OF 3 DOSES IN 15 MINUTES) 25 tablet 3   ondansetron (ZOFRAN-ODT) 4 MG disintegrating tablet Take 1 tablet (4 mg total) by mouth every 8 (eight) hours as needed for nausea or vomiting. 20 tablet 0    potassium chloride SA (KLOR-CON M) 20 MEQ tablet Take 2 tablets (40 mEq total) by mouth daily.     spironolactone (ALDACTONE) 25 MG tablet Take 1/2 (one-half) tablet by mouth once daily 45 tablet 2   vitamin C (ASCORBIC ACID) 500 MG tablet Take 500 mg by mouth daily.     No facility-administered medications prior to visit.    PAST MEDICAL HISTORY: Past Medical History:  Diagnosis Date   Arthritis    Right knee   Atrial fibrillation (HCC)    CAD (coronary artery disease)    a. 06/07/15 NSTEMI/Cath: Dist LAD 100%, mid LAD 10%. Otw nl cors. Nl EF w/ inferoapical AK.   Essential hypertension    Hip pain    History of cardiomyopathy 04/23/2016   a. 04/2016 Echo: EF 45%, Gr2 DD; b. 11/2019 Echo: EF 45-50%, mod LVH. Gr2 DD. Nl RV fxn. Sev dil LA.    Hyperlipidemia    Hypothyroidism    NSTEMI (non-ST elevated myocardial infarction) ([place]) 06/05/2015   Vitamin D deficiency     PAST SURGICAL HISTORY: Past Surgical History:  Procedure Laterality Date   ABDOMINAL HYSTERECTOMY     partial- one ovary remaining   BIOPSY  09/17/2020   Procedure: BIOPSY;  Surgeon: [name], DO;  Location: [facility];  Service: Endoscopy;;   CARDIAC CATHETERIZATION N/A 06/06/2015   Procedure: Left Heart Cath and Coronary Angiography;  Surgeon: Peter M Martinique, MD;  Location: [place] [facility];  Service: Cardiovascular;  Laterality: N/A;   CARDIOVERSION N/A 09/14/2020   Procedure: CARDIOVERSION;  Surgeon: Satira Sark, MD;  Location: [facility];  Service: Cardiovascular;  Laterality: N/A;   CARDIOVERSION N/A 06/20/2022   Procedure: CARDIOVERSION;  Surgeon: Arnoldo Lenis, MD;  Location: [facility];  Service: Endoscopy;  Laterality: N/A;   CARDIOVERSION N/A 01/01/2023   Procedure: CARDIOVERSION;  Surgeon: Buford Dresser, MD;  Location: [facility];  Service: Cardiovascular;  Laterality: N/A;   CHOLECYSTECTOMY     COLONOSCOPY     COLONOSCOPY N/A 08/13/2017   Procedure: COLONOSCOPY;   Surgeon: Daneil Dolin, MD;  Location: [facility];  Service: Endoscopy;  Laterality: N/A;  8:30 AM   COLONOSCOPY WITH PROPOFOL N/A 01/18/2021   [name]: Multiple tubular adenomas removed, next colonoscopy in 3 years   ESOPHAGOGASTRODUODENOSCOPY N/A 09/11/2016   Procedure: ESOPHAGOGASTRODUODENOSCOPY (EGD);  Surgeon: Daneil Dolin, MD;  [facility]: [facility];  Service: Endoscopy;  Laterality: N/A;  7:45 am - moved to 10/11 @ 10:30   ESOPHAGOGASTRODUODENOSCOPY (EGD) WITH PROPOFOL N/A 09/17/2020   [name]: Diffuse moderate inflammation characterized by erosions and erythema found in the entire stomach.  Biopsies positive for H. pylori.  Patient has not been treated due to drug allergies.   Heel spurs Left    MALONEY DILATION  N/A 09/11/2016   Procedure: Venia Minks DILATION;  Surgeon: Daneil Dolin, MD;  [facility]: [facility];  Service: Endoscopy;  Laterality: N/A;   POLYPECTOMY  08/13/2017   Procedure: POLYPECTOMY;  Surgeon: Daneil Dolin, MD;  [facility];  Service: Endoscopy;;  colon   POLYPECTOMY  01/18/2021   Procedure: POLYPECTOMY;  Surgeon: Daneil Dolin, MD;  [facility];  Service: Endoscopy;;   SHOULDER ARTHROSCOPY Right    TUBAL LIGATION      FAMILY HISTORY: Family History  Problem Relation Age of Onset   Heart failure Father        Deceased   Heart attack Father        Deceased   Stroke Sister        Deceased   Heart failure Brother        Deceased   Cancer Brother        Deceased   Heart attack Brother        Deceased   COPD Daughter    Arthritis Daughter    Lupus Daughter    Colon cancer Neg Hx     SOCIAL HISTORY: Social History   Socioeconomic History   Marital status: Single    Spouse name: Not on file   Number of children: 3   Years of education: 10   Highest education level: Not on file  Occupational History   Occupation: Company secretary at [employer]  Tobacco Use   Smoking status: Former    Packs/day: 1.00    Years:  20.00    Total pack years: 20.00    Types: Cigarettes    Start date: 12/02/1977    Quit date: 12/02/2002    Years since quitting: 20.1   Smokeless tobacco: Never   Tobacco comments:    Former smoker 05/10/22  Vaping Use   Vaping Use: Never used  Substance and Sexual Activity   Alcohol use: No    Alcohol/week: 0.0 standard drinks of alcohol   Drug use: No   Sexual activity: Not Currently  Other Topics Concern   Not on file  Social History Narrative   Retired from [employer].   Social Determinants of Health   Financial Resource Strain: High Risk (12/09/2022)   Overall Financial Resource Strain (CARDIA)    Difficulty of Paying Living Expenses: Hard  Food Insecurity: No Food Insecurity (12/09/2022)   Hunger Vital Sign    Worried About Running Out of Food in the Last Year: Never true    Ran Out of Food in the Last Year: Never true  Transportation Needs: No Transportation Needs (12/09/2022)   PRAPARE - Hydrologist (Medical): No    Lack of Transportation (Non-Medical): No  Physical Activity: Inactive (12/09/2022)   Exercise Vital Sign    Days of Exercise per Week: 0 days    Minutes of Exercise per Session: 0 min  Stress: No Stress Concern Present (12/09/2022)   [place]    Feeling of Stress : Not at all  Social Connections: Moderately Integrated (12/09/2022)   Social Connection and Isolation Panel [NHANES]    Frequency of Communication with Friends and Family: More than three times a week    Frequency of Social Gatherings with Friends and Family: Once a week    Attends Religious Services: 1 to 4 times per year    Active Member of [employer] or Organizations: No    Attends Club or  Organization Meetings: Never    Marital Status: Living with partner  Intimate Partner Violence: Not At Risk (12/09/2022)   Humiliation, Afraid, Rape, and Kick questionnaire    Fear of Current or Ex-Partner: No    Emotionally  Abused: No    Physically Abused: No    Sexually Abused: No     PHYSICAL EXAM  GENERAL EXAM/CONSTITUTIONAL: Vitals:  Vitals:   01/15/23 1006  BP: (!) 146/72  Pulse: (!) 51  Weight: 217 lb 6.4 oz (98.6 kg)  Height: 5' 3"$  (1.6 m)   Body mass index is 38.51 kg/m. Wt Readings from Last 3 Encounters:  01/15/23 217 lb 6.4 oz (98.6 kg)  01/10/23 216 lb 3.2 oz (98.1 kg)  01/01/23 217 lb (98.4 kg)   Patient is in no distress; well developed, nourished and groomed; neck is supple  CARDIOVASCULAR: Examination of carotid arteries is normal; no carotid bruits Regular rate and rhythm, no murmurs Examination of peripheral vascular system by observation and palpation is normal  EYES: Ophthalmoscopic exam of optic discs and posterior segments is normal; no papilledema or hemorrhages No results found.  MUSCULOSKELETAL: Gait, strength, tone, movements noted in Neurologic exam below  NEUROLOGIC: MENTAL STATUS:      No data to display         awake, alert, oriented to person, place and time recent and remote memory intact normal attention and concentration language fluent, comprehension intact, naming intact fund of knowledge appropriate  CRANIAL NERVE:  2nd - no papilledema on fundoscopic exam 2nd, 3rd, 4th, 6th - pupils equal and reactive to light, visual fields full to confrontation, extraocular muscles intact, no nystagmus 5th - facial sensation symmetric 7th - facial strength symmetric 8th - hearing intact 9th - palate elevates symmetrically, uvula midline 11th - shoulder shrug symmetric 12th - tongue protrusion midline  MOTOR:  normal bulk and tone, full strength in the BUE, BLE  SENSORY:  normal and symmetric to light touch, temperature, vibration  COORDINATION:  finger-nose-finger, fine finger movements normal  REFLEXES:  deep tendon reflexes TRACE and symmetric  GAIT/STATION:  narrow based gait     DIAGNOSTIC DATA (LABS, IMAGING, TESTING) - I  reviewed patient records, labs, notes, testing and imaging myself where available.  Lab Results  Component Value Date   WBC 7.9 09/05/2022   HGB 10.5 (L) 01/01/2023   HCT 31.0 (L) 01/01/2023   MCV 79.9 (L) 09/05/2022   PLT 378 09/05/2022      Component Value Date/Time   NA 138 01/01/2023 1046   NA 137 07/17/2022 1029   K 4.4 01/01/2023 1046   CL 104 01/01/2023 1046   CO2 27 11/15/2022 1057   GLUCOSE 100 (H) 01/01/2023 1046   BUN 28 (H) 01/01/2023 1046   BUN 17 07/17/2022 1029   CREATININE 1.30 (H) 01/01/2023 1046   CREATININE 0.97 08/23/2020 1117   CALCIUM 8.5 (L) 11/15/2022 1057   PROT 7.4 07/17/2022 1029   ALBUMIN 3.7 (L) 07/17/2022 1029   AST 15 07/17/2022 1029   ALT 11 07/17/2022 1029   ALKPHOS 104 07/17/2022 1029   BILITOT 0.4 07/17/2022 1029   GFRNONAA >60 11/15/2022 1057   GFRNONAA 60 08/23/2020 1117   GFRAA 65 12/22/2020 1617   GFRAA 70 08/23/2020 1117   Lab Results  Component Value Date   CHOL 96 (L) 12/18/2022   HDL 36 (L) 12/18/2022   LDLCALC 45 12/18/2022   TRIG 71 12/18/2022   CHOLHDL 2.7 12/18/2022   Lab Results  Component Value Date  HGBA1C 5.5 01/03/2020   Lab Results  Component Value Date   [id number] 08/23/2020   Lab Results  Component Value Date   TSH 4.050 12/18/2022    08/15/09 xray lumbar spine Bony demineralization with degenerative disc and facet disease  changes lower lumbar spine.  No acute abnormalities.     ASSESSMENT AND PLAN  72 y.o. year old female here with:   Dx:  1. Spinal stenosis of lumbar region with neurogenic claudication   2. Chronic bilateral low back pain with bilateral sciatica   3. Meralgia paraesthetica, right      PLAN:  CHRONIC LOW BACK PAIN (L > R; radiating to legs; weakness worse with standing / walking; since ~2010; worse since 2021) - suspect lumbar spinal stenosis; tried and failed PT, [employer] exercises - check MRI lumbar spine (rule out spinal stenosis with neurogenic  claudication)  RIGHT > LEFT meralgia paresthetica - continue gabapentin; consider referral to pain mgmt if worsens  Orders Placed This Encounter  Procedures   MR [place]   Return for pending if symptoms worsen or fail to improve, pending test results.    Penni Bombard, MD [id number], [id number] AM Certified in Neurology, Neurophysiology and Neuroimaging  [facility] [address], [place], [place] [address] [phone]

## 2023-01-15 NOTE — Telephone Encounter (Signed)
[employer] medicare NPR sent to [facility] [phone]

## 2023-01-23 ENCOUNTER — Encounter: Payer: Self-pay | Admitting: "Endocrinology

## 2023-01-23 ENCOUNTER — Ambulatory Visit: Payer: Medicare Other | Admitting: "Endocrinology

## 2023-01-23 VITALS — BP 130/78 | HR 56 | Ht 63.0 in | Wt 216.8 lb

## 2023-01-23 DIAGNOSIS — E559 Vitamin D deficiency, unspecified: Secondary | ICD-10-CM | POA: Diagnosis not present

## 2023-01-23 DIAGNOSIS — Z6838 Body mass index (BMI) 38.0-38.9, adult: Secondary | ICD-10-CM | POA: Diagnosis not present

## 2023-01-23 DIAGNOSIS — E890 Postprocedural hypothyroidism: Secondary | ICD-10-CM | POA: Diagnosis not present

## 2023-01-23 MED ORDER — LEVOTHYROXINE SODIUM 100 MCG PO TABS: 100.0000 ug | ORAL_TABLET | Freq: Every day | ORAL | 3 refills | Status: DC

## 2023-01-23 NOTE — Progress Notes (Signed)
01/23/2023   Endocrinology follow-up note   Subjective:    Patient ID: Maria Fry, female    DOB: January 30, 1951,    Past Medical History:  Diagnosis Date   Arthritis    Right knee   Atrial fibrillation ([place])    CAD (coronary artery disease)    a. 06/07/15 NSTEMI/Cath: Dist LAD 100%, mid LAD 10%. Otw nl cors. Nl EF w/ inferoapical AK.   Essential hypertension    Hip pain    History of cardiomyopathy 04/23/2016   a. 04/2016 Echo: EF 45%, Gr2 DD; b. 11/2019 Echo: EF 45-50%, mod LVH. Gr2 DD. Nl RV fxn. Sev dil LA.    Hyperlipidemia    Hypothyroidism    NSTEMI (non-ST elevated myocardial infarction) ([place]) 06/05/2015   Vitamin D deficiency    Past Surgical History:  Procedure Laterality Date   ABDOMINAL HYSTERECTOMY     partial- one ovary remaining   BIOPSY  09/17/2020   Procedure: BIOPSY;  Surgeon: [name], DO;  [facility]: [facility];  Service: Endoscopy;;   CARDIAC CATHETERIZATION N/A 06/06/2015   Procedure: Left Heart Cath and Coronary Angiography;  Surgeon: Peter M Martinique, MD;  Location: [facility];  Service: Cardiovascular;  Laterality: N/A;   CARDIOVERSION N/A 09/14/2020   Procedure: CARDIOVERSION;  Surgeon: Satira Sark, MD;  Location: [facility];  Service: Cardiovascular;  Laterality: N/A;   CARDIOVERSION N/A 06/20/2022   Procedure: CARDIOVERSION;  Surgeon: Arnoldo Lenis, MD;  Location: [facility];  Service: Endoscopy;  Laterality: N/A;   CARDIOVERSION N/A 01/01/2023   Procedure: CARDIOVERSION;  Surgeon: Buford Dresser, MD;  Location: [facility];  Service: Cardiovascular;  Laterality: N/A;   CHOLECYSTECTOMY     COLONOSCOPY     COLONOSCOPY N/A 08/13/2017   Procedure: COLONOSCOPY;  Surgeon: Daneil Dolin, MD;  [facility]: [facility];  Service: Endoscopy;  Laterality: N/A;  8:30 AM   COLONOSCOPY WITH PROPOFOL N/A 01/18/2021   [name]: Multiple tubular adenomas removed, next colonoscopy in 3 years   ESOPHAGOGASTRODUODENOSCOPY N/A 09/11/2016    Procedure: ESOPHAGOGASTRODUODENOSCOPY (EGD);  Surgeon: Daneil Dolin, MD;  [facility]: [facility];  Service: Endoscopy;  Laterality: N/A;  7:45 am - moved to 10/11 @ 10:30   ESOPHAGOGASTRODUODENOSCOPY (EGD) WITH PROPOFOL N/A 09/17/2020   [name]: Diffuse moderate inflammation characterized by erosions and erythema found in the entire stomach.  Biopsies positive for H. pylori.  Patient has not been treated due to drug allergies.   Heel spurs Left    MALONEY DILATION N/A 09/11/2016   Procedure: Venia Minks DILATION;  Surgeon: Daneil Dolin, MD;  [facility];  Service: Endoscopy;  Laterality: N/A;   POLYPECTOMY  08/13/2017   Procedure: POLYPECTOMY;  Surgeon: Daneil Dolin, MD;  [facility];  Service: Endoscopy;;  colon   POLYPECTOMY  01/18/2021   Procedure: POLYPECTOMY;  Surgeon: Daneil Dolin, MD;  [facility];  Service: Endoscopy;;   SHOULDER ARTHROSCOPY Right    TUBAL LIGATION     Social History   Socioeconomic History   Marital status: Single    Spouse name: Not on file   Number of children: 3   Years of education: 10   Highest education level: Not on file  Occupational History   Occupation: team leader at [employer]  Tobacco Use   Smoking status: Former    Packs/day: 1.00    Years: 20.00    Total pack years: 20.00    Types: Cigarettes  Start date: 12/02/1977    Quit date: 12/02/2002    Years since quitting: 20.1   Smokeless tobacco: Never   Tobacco comments:    Former smoker 05/10/22  Vaping Use   Vaping Use: Never used  Substance and Sexual Activity   Alcohol use: No    Alcohol/week: 0.0 standard drinks of alcohol   Drug use: No   Sexual activity: Not Currently  Other Topics Concern   Not on file  Social History Narrative   Retired from [employer].   Social Determinants of Health   Financial Resource Strain: High Risk (12/09/2022)   Overall Financial Resource Strain (CARDIA)    Difficulty of Paying Living Expenses: Hard   Food Insecurity: No Food Insecurity (12/09/2022)   Hunger Vital Sign    Worried About Running Out of Food in the Last Year: Never true    Ran Out of Food in the Last Year: Never true  Transportation Needs: No Transportation Needs (12/09/2022)   PRAPARE - Hydrologist (Medical): No    Lack of Transportation (Non-Medical): No  Physical Activity: Inactive (12/09/2022)   Exercise Vital Sign    Days of Exercise per Week: 0 days    Minutes of Exercise per Session: 0 min  Stress: No Stress Concern Present (12/09/2022)   [place]    Feeling of Stress : Not at all  Social Connections: Moderately Integrated (12/09/2022)   Social Connection and Isolation Panel [NHANES]    Frequency of Communication with Friends and Family: More than three times a week    Frequency of Social Gatherings with Friends and Family: Once a week    Attends Religious Services: 1 to 4 times per year    Active Member of [employer] or Organizations: No    Attends Archivist Meetings: Never    Marital Status: Living with partner   Outpatient Encounter Medications as of 01/23/2023  Medication Sig   acetaminophen (TYLENOL) 500 MG tablet Take 500 mg by mouth 2 (two) times daily.   amiodarone (PACERONE) 200 MG tablet Take 1 tablet (200 mg total) by mouth daily.   amLODipine (NORVASC) 2.5 MG tablet Take 1 tablet (2.5 mg total) by mouth daily.   apixaban (ELIQUIS) 5 MG TABS tablet Take 1 tablet by mouth twice daily   atorvastatin (LIPITOR) 80 MG tablet Take 1 tablet (80 mg total) by mouth at bedtime.   calcium carbonate (OS-CAL - DOSED IN MG OF ELEMENTAL CALCIUM) 1250 (500 Ca) MG tablet Take 500 mg by mouth daily.   Cholecalciferol (VITAMIN D3) 50 MCG (2000 UT) capsule Take 1 capsule (2,000 Units total) by mouth daily.   clopidogrel (PLAVIX) 75 MG tablet Take 75 mg by mouth daily.   diclofenac Sodium (VOLTAREN) 1 % GEL Apply 1 application  topically 4 (four) times daily as needed (pain).   ergocalciferol (VITAMIN D2) 1.25 MG (50000 UT) capsule Take 50,000 Units by mouth once a week.   ferrous sulfate 325 (65 FE) MG EC tablet Take 1 tablet (325 mg total) by mouth daily. (Patient taking differently: Take 325 mg by mouth 4 (four) times a week.)   furosemide (LASIX) 40 MG tablet Take 1 tablet (40 mg total) by mouth daily.   gabapentin (NEURONTIN) 300 MG capsule Take 1 capsule (300 mg total) by mouth 2 (two) times daily.   levothyroxine (SYNTHROID) 100 MCG tablet Take 1 tablet (100 mcg total) by mouth daily before breakfast.   magnesium  oxide (MAG-OX) 400 MG tablet Take 0.5 tablets (200 mg total) by mouth daily.   melatonin 3 MG TABS tablet Take 3 mg by mouth at bedtime as needed (Sleep).   metoprolol tartrate (LOPRESSOR) 25 MG tablet TAKE ONE-HALF TABLET BY MOUTH TWO TIMES DAILY   nitroGLYCERIN (NITROSTAT) 0.4 MG SL tablet DISSOLVE ONE TABLET UNDER THE TONGUE EVERY 5 MINUTES AS NEEDED FOR CHEST PAIN.  DO NOT EXCEED A TOTAL OF 3 DOSES IN 15 MINUTES (Patient taking differently: DISSOLVE ONE TABLET UNDER THE TONGUE EVERY 5 MINUTES AS NEEDED FOR CHEST PAIN.  DO NOT EXCEED A TOTAL OF 3 DOSES IN 15 MINUTES)   ondansetron (ZOFRAN-ODT) 4 MG disintegrating tablet Take 1 tablet (4 mg total) by mouth every 8 (eight) hours as needed for nausea or vomiting.   potassium chloride SA (KLOR-CON M) 20 MEQ tablet Take 2 tablets (40 mEq total) by mouth daily.   spironolactone (ALDACTONE) 25 MG tablet Take 1/2 (one-half) tablet by mouth once daily   vitamin C (ASCORBIC ACID) 500 MG tablet Take 500 mg by mouth daily.   [DISCONTINUED] levothyroxine (SYNTHROID) 100 MCG tablet Take 1 tablet (100 mcg total) by mouth daily before breakfast.   No facility-administered encounter medications on file as of 01/23/2023.   ALLERGIES: Allergies  Allergen Reactions   Afrin [Oxymetazoline] Anaphylaxis   Amoxicillin Anaphylaxis   Flonase [Fluticasone] Anaphylaxis    Lisinopril Anaphylaxis   Ranolazine Anaphylaxis    Throat swells, Dizzy, Reaction occurred with generic med   Vicodin [Hydrocodone-Acetaminophen] Diarrhea and Nausea And Vomiting   Demerol [Meperidine] Nausea And Vomiting   Lodine [Etodolac] Rash   VACCINATION STATUS: Immunization History  Administered Date(s) Administered   Fluad Quad(high Dose 65+) 10/21/2019, 08/18/2020, 09/05/2021, 09/19/2022   Influenza, High Dose Seasonal PF 08/24/2018   Influenza,inj,Quad PF,6+ Mos 10/02/2015, 08/19/2016, 08/25/2017, 09/02/2019   PFIZER(Purple Top)SARS-COV-2 Vaccination 01/24/2020, 02/14/2020, 10/31/2020, 06/29/2021   Pneumococcal Conjugate-13 01/01/2019   Pneumococcal Polysaccharide-23 01/03/2020    Thyroid Problem Presents for follow-up (She was given RAI therapy for Graves' disease in the middle of September 2016.  She is currently on levothyroxine 100 mcg for RAI induced for thyroidism.  nduced hypothyroidism.) visit. Patient reports no cold intolerance, diarrhea, fatigue, heat intolerance or palpitations. The symptoms have been improving (She denies palpitations, tremors, heat/cold intolerance.   She denies dysphagia or shortness of breath, nor voice change.). Past treatments include iodine 131. The treatment provided significant relief. Risk factors include prior iodine 131 therapy.   Review of systems : Limited as above.  Objective:    BP 130/78   Pulse (!) 56   Ht 5' 3"$  (1.6 m)   Wt 216 lb 12.8 oz (98.3 kg)   BMI 38.40 kg/m   Wt Readings from Last 3 Encounters:  01/23/23 216 lb 12.8 oz (98.3 kg)  01/15/23 217 lb 6.4 oz (98.6 kg)  01/10/23 216 lb 3.2 oz (98.1 kg)      Results for orders placed or performed during the hospital encounter of 01/01/23  I-STAT, chem 8  Result Value Ref Range   Sodium 138 135 - 145 mmol/L   Potassium 4.4 3.5 - 5.1 mmol/L   Chloride 104 98 - 111 mmol/L   BUN 28 (H) 8 - 23 mg/dL   Creatinine, Ser 1.30 (H) 0.44 - 1.00 mg/dL   Glucose, Bld 100 (H) 70  - 99 mg/dL   Calcium, Ion 1.12 (L) 1.15 - 1.40 mmol/L   TCO2 26 22 - 32 mmol/L   Hemoglobin 10.5 (L)  12.0 - 15.0 g/dL   HCT 31.0 (L) 36.0 - 46.0 %     Assessment & Plan:   1.  RAI induced Hypothyroidism:    2.  Vitamin D deficiency  3.  Obesity with BMI of 38.4   She is status post RAI therapy in September 2016 For hyperthyroidism. -Her previsit thyroid function tests are consistent with appropriate replacement.  She is advised to continue levothyroxine 100 mcg p.o. daily before breakfast.    - We discussed about the correct intake of her thyroid hormone, on empty stomach at fasting, with water, separated by at least 30 minutes from breakfast and other medications,  and separated by more than 4 hours from calcium, iron, multivitamins, acid reflux medications (PPIs). -Patient is made aware of the fact that thyroid hormone replacement is needed for life, dose to be adjusted by periodic monitoring of thyroid function tests.  -he is advised to resume and continue her calcium carbonate 500 mg p.o. once a day at lunch or supper.  Regarding her vitamin D deficiency, she admits that she has not been consistent taking her vitamin D3.  I advised her to be consistent and continue vitamin D3 2000 units daily.  Regarding her concern on weight and her desire to be cleared for ablative treatment for cardiac tachyarrhythmia, she is advised on whole food plant-based diet.   I advised patient to maintain close follow up with her PCP for primary care needs.   I spent  21  minutes in the care of the patient today including review of labs from Thyroid Function, CMP, and other relevant labs ; imaging/biopsy records (current and previous including abstractions from other facilities); face-to-face time discussing  her lab results and symptoms, medications doses, her options of short and long term treatment based on the latest standards of care / guidelines;   and documenting the encounter.  Maria Fry   participated in the discussions, expressed understanding, and voiced agreement with the above plans.  All questions were answered to her satisfaction. she is encouraged to contact clinic should she have any questions or concerns prior to her return visit.   Follow up plan: Return in about 1 year (around 01/24/2024) for Fasting Labs  in AM B4 8.  Glade Lloyd, MD Phone: [phone]  Fax: [phone]  -  This note was partially dictated with voice recognition software. Similar sounding words can be transcribed inadequately or may not  be corrected upon review.  01/23/2023, 10:11 AM

## 2023-01-23 NOTE — Patient Instructions (Signed)

## 2023-01-24 ENCOUNTER — Encounter (HOSPITAL_COMMUNITY): Payer: Self-pay | Admitting: Physician Assistant

## 2023-01-24 ENCOUNTER — Ambulatory Visit (HOSPITAL_COMMUNITY)
Admission: RE | Admit: 2023-01-24 | Discharge: 2023-01-24 | Disposition: A | Discharge status: Home / Self Care | Payer: Medicare Other | Source: Ambulatory Visit | Attending: Physician Assistant | Admitting: Physician Assistant

## 2023-01-24 VITALS — BP 132/70 | HR 45

## 2023-01-24 DIAGNOSIS — I440 Atrioventricular block, first degree: Secondary | ICD-10-CM | POA: Insufficient documentation

## 2023-01-24 DIAGNOSIS — Z79899 Other long term (current) drug therapy: Secondary | ICD-10-CM | POA: Diagnosis present

## 2023-01-24 DIAGNOSIS — I4819 Other persistent atrial fibrillation: Secondary | ICD-10-CM

## 2023-01-24 NOTE — Progress Notes (Signed)
Patient returns for ECG after decreasing amiodarone. ECG shows:  SB, 1st degree AV block Vent. rate 45 BPM PR interval 218 ms QRS duration 104 ms QT/QTcB 550/475 ms  Patient's fatigue is minimally improved. Will discontinue Lopressor for symptomatic bradycardia. Follow up with Dr Domenic Polite and Dr Myles Gip as scheduled. She will need liver function checked at follow up. TSH followed by endocrinology.

## 2023-02-12 ENCOUNTER — Ambulatory Visit (HOSPITAL_COMMUNITY)
Admission: RE | Admit: 2023-02-12 | Discharge: 2023-02-12 | Disposition: A | Discharge status: Home / Self Care | Payer: Medicare Other | Source: Ambulatory Visit | Attending: Diagnostic Neuroimaging | Admitting: Diagnostic Neuroimaging

## 2023-02-12 DIAGNOSIS — M5442 Lumbago with sciatica, left side: Secondary | ICD-10-CM | POA: Insufficient documentation

## 2023-02-12 DIAGNOSIS — G8929 Other chronic pain: Secondary | ICD-10-CM | POA: Insufficient documentation

## 2023-02-12 DIAGNOSIS — M48062 Spinal stenosis, lumbar region with neurogenic claudication: Secondary | ICD-10-CM | POA: Diagnosis present

## 2023-02-12 DIAGNOSIS — M5441 Lumbago with sciatica, right side: Secondary | ICD-10-CM | POA: Insufficient documentation

## 2023-02-14 ENCOUNTER — Encounter: Payer: Self-pay | Admitting: Internal Medicine

## 2023-02-14 ENCOUNTER — Ambulatory Visit (INDEPENDENT_AMBULATORY_CARE_PROVIDER_SITE_OTHER): Payer: Medicare Other | Admitting: Internal Medicine

## 2023-02-14 VITALS — BP 135/74 | HR 70 | Ht 63.0 in | Wt 212.8 lb

## 2023-02-14 DIAGNOSIS — E559 Vitamin D deficiency, unspecified: Secondary | ICD-10-CM

## 2023-02-14 DIAGNOSIS — D509 Iron deficiency anemia, unspecified: Secondary | ICD-10-CM

## 2023-02-14 DIAGNOSIS — I4819 Other persistent atrial fibrillation: Secondary | ICD-10-CM | POA: Diagnosis not present

## 2023-02-14 DIAGNOSIS — M545 Low back pain, unspecified: Secondary | ICD-10-CM

## 2023-02-14 DIAGNOSIS — Z6841 Body Mass Index (BMI) 40.0 and over, adult: Secondary | ICD-10-CM

## 2023-02-14 NOTE — Progress Notes (Unsigned)
Established Patient Office Visit  Subjective   Patient ID: Maria Fry, female    DOB: November 06, 1951  Age: 72 y.o. MRN: [id number]  Chief Complaint  Patient presents with   Hyperlipidemia    Follow up   [name] returns to care today for follow-up.  She was last evaluated by me on 11/14/22 for routine follow-up.  No medication changes were made at that time.  In the interim she has undergone DCCV in late January for persistent atrial fibrillation.  She has also establish care with a different neurologist (Dr. Leta Baptist).  There have otherwise been no acute interval events.  [name] reports feeling well today.  She is asymptomatic and has no acute concerns to discuss.  Patient Active Problem List   Diagnosis Date Noted   Meralgia paresthetica 11/20/2022   Lumbar back pain 09/25/2022   Encounter for general adult medical examination with abnormal findings 09/25/2022   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[place]) 07/01/2022   Elevated brain natriuretic peptide (BNP) level 06/19/2022   Insomnia 06/19/2022   Chest pain 06/18/2022   Hypomagnesemia 09/05/2021   Wheezing 03/06/2021   Sleep apnea 02/06/2021   Vitamin D insufficiency 01/30/2021   Encounter to establish care [id number]   Helicobacter pylori gastritis 11/01/2020   H/O adenomatous polyp of colon 10/31/2020   Constipation 10/15/2020   History of GI bleed 10/15/2020   Persistent atrial fibrillation ([place]) 10/10/2020   Hypercoagulable state due to persistent atrial fibrillation ([place]) 10/05/2020   Obesity, Class III, BMI 40-49.9 (morbid obesity) ([place])    Gastroesophageal reflux disease    Gastritis    Acquired hypothyroidism    Melena    CHF exacerbation ([place]) 09/15/2020   Atrial fibrillation, chronic ([place]) 08/23/2020   Stable angina 01/14/2020   Abnormal nuclear stress test 12/08/2019   Iron deficiency anemia 11/28/2019   Mixed hyperlipidemia 11/27/2019   OA (osteoarthritis) of knee 05/18/2019   DDD (degenerative disc disease), lumbar  05/18/2019   Obesity 12/26/2017   Seasonal allergies 08/19/2016   Glucose intolerance (impaired glucose tolerance) 05/14/2016   Chronic hip pain 05/14/2016   Chronic combined systolic and diastolic heart failure ([place]) 05/14/2016   Hypothyroidism following radioiodine therapy 11/30/2015   CAD (coronary artery disease)    NSTEMI (non-ST elevated myocardial infarction) ([place])    Essential hypertension 06/05/2015   Past Medical History:  Diagnosis Date   Arthritis    Right knee   Atrial fibrillation (H[facility])    CAD (coronary artery disease)    a. 06/07/15 NSTEMI/Cath: Dist LAD 100%, mid LAD 10%. Otw nl cors. Nl EF w/ inferoapical AK.   Essential hypertension    Hip pain    History of cardiomyopathy 04/23/2016   a. 04/2016 Echo: EF 45%, Gr2 DD; b. 11/2019 Echo: EF 45-50%, mod LVH. Gr2 DD. Nl RV fxn. Sev dil LA.    Hyperlipidemia    Hypothyroidism    NSTEMI (non-ST elevated myocardial infarction) ([place]) 06/05/2015   Vitamin D deficiency    Past Surgical History:  Procedure Laterality Date   ABDOMINAL HYSTERECTOMY     partial- one ovary remaining   BIOPSY  09/17/2020   Procedure: BIOPSY;  Surgeon: [name], DO;  [facility];  Service: Endoscopy;;   CARDIAC CATHETERIZATION N/A 06/06/2015   Procedure: Left Heart Cath and Coronary Angiography;  Surgeon: Peter M Martinique, MD;  Location: [facility];  Service: Cardiovascular;  Laterality: N/A;   CARDIOVERSION N/A 09/14/2020   Procedure: CARDIOVERSION;  Surgeon: Satira Sark, MD;  Location: [facility];  Service: Cardiovascular;  Laterality: N/A;   CARDIOVERSION N/A 06/20/2022   Procedure: CARDIOVERSION;  Surgeon: Arnoldo Lenis, MD;  Location: [facility];  Service: Endoscopy;  Laterality: N/A;   CARDIOVERSION N/A 01/01/2023   Procedure: CARDIOVERSION;  Surgeon: Buford Dresser, MD;  Location: [facility] ENDOSCOPY;  Service: Cardiovascular;  Laterality: N/A;   CHOLECYSTECTOMY     COLONOSCOPY     COLONOSCOPY N/A  08/13/2017   Procedure: COLONOSCOPY;  Surgeon: Daneil Dolin, MD;  [facility]: [facility];  Service: Endoscopy;  Laterality: N/A;  8:30 AM   COLONOSCOPY WITH PROPOFOL N/A 01/18/2021   [name]: Multiple tubular adenomas removed, next colonoscopy in 3 years   ESOPHAGOGASTRODUODENOSCOPY N/A 09/11/2016   Procedure: ESOPHAGOGASTRODUODENOSCOPY (EGD);  Surgeon: Daneil Dolin, MD;  [facility];  Service: Endoscopy;  Laterality: N/A;  7:45 am - moved to 10/11 @ 10:30   ESOPHAGOGASTRODUODENOSCOPY (EGD) WITH PROPOFOL N/A 09/17/2020   [name]: Diffuse moderate inflammation characterized by erosions and erythema found in the entire stomach.  Biopsies positive for H. pylori.  Patient has not been treated due to drug allergies.   Heel spurs Left    MALONEY DILATION N/A 09/11/2016   Procedure: Venia Minks DILATION;  Surgeon: Daneil Dolin, MD;  [facility];  Service: Endoscopy;  Laterality: N/A;   POLYPECTOMY  08/13/2017   Procedure: POLYPECTOMY;  Surgeon: Daneil Dolin, MD;  [facility];  Service: Endoscopy;;  colon   POLYPECTOMY  01/18/2021   Procedure: POLYPECTOMY;  Surgeon: Daneil Dolin, MD;  [facility];  Service: Endoscopy;;   SHOULDER ARTHROSCOPY Right    TUBAL LIGATION     Social History   Tobacco Use   Smoking status: Former    Packs/day: 1.00    Years: 20.00    Additional pack years: 0.00    Total pack years: 20.00    Types: Cigarettes    Start date: 12/02/1977    Quit date: 12/02/2002    Years since quitting: 20.2   Smokeless tobacco: Never   Tobacco comments:    Former smoker 05/10/22  Vaping Use   Vaping Use: Never used  Substance Use Topics   Alcohol use: No    Alcohol/week: 0.0 standard drinks of alcohol   Drug use: No   Social History   Socioeconomic History   Marital status: Single    Spouse name: Not on file   Number of children: 3   Years of education: 10   Highest education level: Not on file  Occupational History    Occupation: Company secretary at [employer] Use   Smoking status: Former    Packs/day: 1.00    Years: 20.00    Additional pack years: 0.00    Total pack years: 20.00    Types: Cigarettes    Start date: 12/02/1977    Quit date: 12/02/2002    Years since quitting: 20.2   Smokeless tobacco: Never   Tobacco comments:    Former smoker 05/10/22  Vaping Use   Vaping Use: Never used  Substance and Sexual Activity   Alcohol use: No    Alcohol/week: 0.0 standard drinks of alcohol   Drug use: No   Sexual activity: Not Currently  Other Topics Concern   Not on file  Social History Narrative   Retired from [employer].   Social Determinants of Health   Financial Resource Strain: High Risk (12/09/2022)   Overall Financial Resource Strain (CARDIA)  Difficulty of Paying Living Expenses: Hard  Food Insecurity: No Food Insecurity (12/09/2022)   Hunger Vital Sign    Worried About Running Out of Food in the Last Year: Never true    Ran Out of Food in the Last Year: Never true  Transportation Needs: No Transportation Needs (12/09/2022)   PRAPARE - Hydrologist (Medical): No    Lack of Transportation (Non-Medical): No  Physical Activity: Inactive (12/09/2022)   Exercise Vital Sign    Days of Exercise per Week: 0 days    Minutes of Exercise per Session: 0 min  Stress: No Stress Concern Present (12/09/2022)   [place]    Feeling of Stress : Not at all  Social Connections: Moderately Integrated (12/09/2022)   Social Connection and Isolation Panel [NHANES]    Frequency of Communication with Friends and Family: More than three times a week    Frequency of Social Gatherings with Friends and Family: Once a week    Attends Religious Services: 1 to 4 times per year    Active Member of [employer] or Organizations: No    Attends Archivist Meetings: Never    Marital Status: Living with partner   Intimate Partner Violence: Not At Risk (12/09/2022)   Humiliation, Afraid, Rape, and Kick questionnaire    Fear of Current or Ex-Partner: No    Emotionally Abused: No    Physically Abused: No    Sexually Abused: No   Family Status  Relation Name Status   Mother  Deceased   Father  Deceased   Sister  (Not Specified)   Brother  (Not Specified)   Brother  (Not Specified)   Daughter  Alive   Daughter  Alive   Son  Alive   Neg Hx  (Not Specified)   Family History  Problem Relation Age of Onset   Heart failure Father        Deceased   Heart attack Father        Deceased   Stroke Sister        Deceased   Heart failure Brother        Deceased   Cancer Brother        Deceased   Heart attack Brother        Deceased   COPD Daughter    Arthritis Daughter    Lupus Daughter    Colon cancer Neg Hx    Allergies  Allergen Reactions   Afrin [Oxymetazoline] Anaphylaxis   Amoxicillin Anaphylaxis   Flonase [Fluticasone] Anaphylaxis   Lisinopril Anaphylaxis   Ranolazine Anaphylaxis    Throat swells, Dizzy, Reaction occurred with generic med   Vicodin [Hydrocodone-Acetaminophen] Diarrhea and Nausea And Vomiting   Demerol [Meperidine] Nausea And Vomiting   Lodine [Etodolac] Rash   Review of Systems  Constitutional:  Negative for chills and fever.  HENT:  Negative for sore throat.   Respiratory:  Negative for cough and shortness of breath.   Cardiovascular:  Negative for chest pain, palpitations and leg swelling.  Gastrointestinal:  Negative for abdominal pain, blood in stool, constipation, diarrhea, nausea and vomiting.  Genitourinary:  Negative for dysuria and hematuria.  Musculoskeletal:  Positive for back pain (chronic lumbar back pain). Negative for myalgias.  Skin:  Negative for itching and rash.  Neurological:  Negative for dizziness and headaches.  Psychiatric/Behavioral:  Negative for depression and suicidal ideas.      Objective:  BP 135/74   Pulse 70   Ht 5'  3" (1.6 m)   Wt 212 lb 12.8 oz (96.5 kg)   SpO2 95%   BMI 37.70 kg/m  BP Readings from Last 3 Encounters:  02/17/23 130/72  02/14/23 135/74  01/24/23 132/70   Physical Exam Vitals reviewed.  Constitutional:      General: She is not in acute distress.    Appearance: Normal appearance. She is obese. She is not toxic-appearing.  HENT:     Head: Normocephalic and atraumatic.     Right Ear: External ear normal.     Left Ear: External ear normal.     Nose: Nose normal. No congestion or rhinorrhea.     Mouth/Throat:     Mouth: Mucous membranes are moist.     Pharynx: Oropharynx is clear. No oropharyngeal exudate or posterior oropharyngeal erythema.  Eyes:     General: No scleral icterus.    Extraocular Movements: Extraocular movements intact.     Conjunctiva/sclera: Conjunctivae normal.     Pupils: Pupils are equal, round, and reactive to light.  Cardiovascular:     Rate and Rhythm: Normal rate and regular rhythm.     Pulses: Normal pulses.     Heart sounds: Normal heart sounds. No murmur heard.    No friction rub. No gallop.  Pulmonary:     Effort: Pulmonary effort is normal.     Breath sounds: Normal breath sounds. No wheezing, rhonchi or rales.  Abdominal:     General: Abdomen is flat. Bowel sounds are normal. There is no distension.     Palpations: Abdomen is soft.     Tenderness: There is no abdominal tenderness.  Musculoskeletal:        General: No swelling. Normal range of motion.     Cervical back: Normal range of motion.     Right lower leg: No edema.     Left lower leg: No edema.  Lymphadenopathy:     Cervical: No cervical adenopathy.  Skin:    General: Skin is warm and dry.     Capillary Refill: Capillary refill takes less than 2 seconds.     Coloration: Skin is not jaundiced.  Neurological:     General: No focal deficit present.     Mental Status: She is alert and oriented to person, place, and time.  Psychiatric:        Mood and Affect: Mood normal.         Behavior: Behavior normal.   Last CBC Lab Results  Component Value Date   WBC 7.9 09/05/2022   HGB 10.5 (L) 01/01/2023   HCT 31.0 (L) 01/01/2023   MCV 79.9 (L) 09/05/2022   MCH 24.5 (L) 09/05/2022   RDW 23.8 (H) 09/05/2022   PLT 378 [id number]   Last metabolic panel Lab Results  Component Value Date   GLUCOSE 100 (H) 01/01/2023   NA 138 01/01/2023   K 4.4 01/01/2023   CL 104 01/01/2023   CO2 27 11/15/2022   BUN 28 (H) 01/01/2023   CREATININE 1.30 (H) 01/01/2023   GFRNONAA >60 11/15/2022   CALCIUM 8.5 (L) 11/15/2022   PHOS 3.1 06/19/2022   PROT 7.4 07/17/2022   ALBUMIN 3.7 (L) 07/17/2022   LABGLOB 3.7 07/17/2022   AGRATIO 1.0 (L) 07/17/2022   BILITOT 0.4 07/17/2022   ALKPHOS 104 07/17/2022   AST 15 07/17/2022   ALT 11 07/17/2022   ANIONGAP 4 (L) 11/15/2022   Last lipids Lab Results  Component Value  Date   CHOL 96 (L) 12/18/2022   HDL 36 (L) 12/18/2022   LDLCALC 45 12/18/2022   TRIG 71 12/18/2022   CHOLHDL 2.7 12/18/2022   Last hemoglobin A1c Lab Results  Component Value Date   HGBA1C 5.5 01/03/2020   Last thyroid functions Lab Results  Component Value Date   TSH 4.050 12/18/2022   Last vitamin D Lab Results  Component Value Date   VD25OH 20.3 (L) 12/18/2022   Last vitamin B12 and Folate Lab Results  Component Value Date   VITAMINB12 403 08/23/2020     Assessment & Plan:   Problem List Items Addressed This Visit       Persistent atrial fibrillation ([place])    Persistent A-fib vs atypical atrial flutter.  Regular rate and rhythm on exam today followed by electrophysiology (Dr. Myles Gip).  S/p DCCV in late January.  She is currently prescribed amiodarone 200 mg daily and I will close 5 mg daily.  Discussing ablation, however she was told she would need to lose at least 15-20 pounds prior to the procedure. -Cardiology follow-up scheduled for June -She has made lifestyle modifications aimed at losing weight and has lost 4 pounds since late February.       Obesity    BMI 37.8 today.  Needs to lose 15-20 pounds to improve candidacy for A-fib ablation.  She is following a diet that was provided by her endocrinologist (Dr. [name]) and has lost 4 pounds since late February.      Iron deficiency anemia - Primary    Previously documented history of iron deficiency anemia.  Microcytic anemia noted on recent labs.  She is not currently on oral iron supplementation. -Repeat CBC and iron studies ordered today      Vitamin D insufficiency    Noted on labs from January.  I have recommended that she resume daily vitamin D supplementation.      Lumbar back pain    Chronic issue.  MRI lumbar spine pending to assess for spinal stenosis.  This was ordered by her neurologist.  Previously unable to complete physical therapy due to atrial fibrillation. -MRI pending -Recommended resuming PT as able      Return in about 3 months (around 05/17/2023).   Johnette Abraham, MD

## 2023-02-14 NOTE — Patient Instructions (Signed)
It was a pleasure to see you today.  Thank you for giving [place] the opportunity to be involved in your care.  Below is a brief recap of your visit and next steps.  We will plan to see you again in 3 months.  Summary No medication changes today Repeat iron studies have been ordered Plan for follow up in 3 months

## 2023-02-17 ENCOUNTER — Encounter: Payer: Self-pay | Admitting: Pulmonary Disease

## 2023-02-17 ENCOUNTER — Ambulatory Visit: Payer: Medicare Other | Admitting: Pulmonary Disease

## 2023-02-17 VITALS — BP 130/72 | HR 64 | Ht 63.0 in | Wt 213.8 lb

## 2023-02-17 DIAGNOSIS — E669 Obesity, unspecified: Secondary | ICD-10-CM | POA: Diagnosis not present

## 2023-02-17 DIAGNOSIS — G4730 Sleep apnea, unspecified: Secondary | ICD-10-CM

## 2023-02-17 NOTE — Progress Notes (Signed)
[facility]  Chief Complaint  Patient presents with   Follow-up    Has not used CPAP in months     Constitutional:  BP 130/72   Pulse 64   Ht 5\' 3"  (1.6 m)   Wt 213 lb 12.8 oz (97 kg)   SpO2 100% Comment: ra  BMI 37.87 kg/m   Past Medical History:  CAD, CHF, HTN, HLD, A Fib, Hypothyroidism, Vit D deficiency, COVID 19 PNA February 2023  Past Surgical History:  Her  has a past surgical history that includes Tubal ligation; Cholecystectomy; Shoulder arthroscopy (Right); Heel spurs (Left); Cardiac catheterization (N/A, 06/06/2015); Esophagogastroduodenoscopy (N/A, 09/11/2016); maloney dilation (N/A, 09/11/2016); Colonoscopy; Colonoscopy (N/A, 08/13/2017); polypectomy (08/13/2017); Cardioversion (N/A, 09/14/2020); Esophagogastroduodenoscopy (egd) with propofol (N/A, 09/17/2020); biopsy (09/17/2020); Colonoscopy with propofol (N/A, 01/18/2021); polypectomy (01/18/2021); Abdominal hysterectomy; Cardioversion (N/A, 06/20/2022); and Cardioversion (N/A, 01/01/2023).  Brief Summary:  Maria Fry is a 72 y.o. female former smoker with obstructive sleep apnea and sleep maintenance insomnia      Subjective:   She had cardioversion earlier this year.    She has been working on weight loss.  She is hoping to lose another 10 to 15 lbs.  She is sleeping better since she lost weight.  She having trouble with mask fit and air leak after weight loss.  As a result she stopped using CPAP, but hasn't felt her sleep has gotten worse without CPAP.  Physical Exam:   Appearance - well kempt   ENMT - no sinus tenderness, no oral exudate, no LAN, Mallampati 4 airway, no stridor, wears dentures  Respiratory - equal breath sounds bilaterally, no wheezing or rales  CV - s1s2 regular rate and rhythm, no murmurs  Ext - no clubbing, no edema  Skin - no rashes  Psych - normal mood and affect      Sleep Tests:  HST 01/29/21 >> AHI 9.9, SpO2 low 76%  Cardiac  Tests:  Echo 06/19/22 >> EF 55%, severe LVH  Social History:  She  reports that she quit smoking about 20 years ago. Her smoking use included cigarettes. She started smoking about 45 years ago. She has a 20.00 pack-year smoking history. She has never used smokeless tobacco. She reports that she does not drink alcohol and does not use drugs.  Family History:  Her family history includes Arthritis in her daughter; COPD in her daughter; Cancer in her brother; Heart attack in her brother and father; Heart failure in her brother and father; Lupus in her daughter; Stroke in her sister.     Assessment/Plan:   Obstructive sleep apnea. - she had trouble with mask fit and air leak, and stopped using CPAP - she feels her sleep has gotten better with recent weight loss, and is optimistic that she will be able to lose additional weight - she would like to defer resuming CPAP for now - discussed that we would need to reconsider CPAP therapy if she develops atrial fibrillation again - will reassess at her next follow up and then determine if she should have a repeat home sleep study  Persistent atrial fibrillation, Chronic diastolic CHF, Coronary artery disease. - she is followed by Dr. Johnny Bridge with [facility]  Time Spent Involved in Patient Care on Day of Examination:  26 minutes  Follow up:   Patient Instructions  Follow up in 6 months  Medication List:   Allergies as of 02/17/2023       Reactions  Afrin [oxymetazoline] Anaphylaxis   Amoxicillin Anaphylaxis   Flonase [fluticasone] Anaphylaxis   Lisinopril Anaphylaxis   Ranolazine Anaphylaxis   Throat swells, Dizzy, Reaction occurred with generic med   Vicodin [hydrocodone-acetaminophen] Diarrhea, Nausea And Vomiting   Demerol [meperidine] Nausea And Vomiting   Lodine [etodolac] Rash        Medication List        Accurate as of February 17, 2023 11:03 AM. If you have any questions, ask your nurse or doctor.           acetaminophen 500 MG tablet Commonly known as: TYLENOL Take 500 mg by mouth 2 (two) times daily.   amiodarone 200 MG tablet Commonly known as: PACERONE Take 1 tablet (200 mg total) by mouth daily.   amLODipine 2.5 MG tablet Commonly known as: NORVASC Take 1 tablet (2.5 mg total) by mouth daily.   ascorbic acid 500 MG tablet Commonly known as: VITAMIN C Take 500 mg by mouth daily.   atorvastatin 80 MG tablet Commonly known as: LIPITOR Take 1 tablet (80 mg total) by mouth at bedtime.   calcium carbonate 1250 (500 Ca) MG tablet Commonly known as: OS-CAL - dosed in mg of elemental calcium Take 500 mg by mouth daily.   clopidogrel 75 MG tablet Commonly known as: PLAVIX Take 75 mg by mouth daily.   diclofenac Sodium 1 % Gel Commonly known as: VOLTAREN Apply 1 application topically 4 (four) times daily as needed (pain).   Eliquis 5 MG Tabs tablet Generic drug: apixaban Take 1 tablet by mouth twice daily   ergocalciferol 1.25 MG (50000 UT) capsule Commonly known as: VITAMIN D2 Take 50,000 Units by mouth once a week.   ferrous sulfate 325 (65 FE) MG EC tablet Take 1 tablet (325 mg total) by mouth daily. What changed: when to take this   furosemide 40 MG tablet Commonly known as: Lasix Take 1 tablet (40 mg total) by mouth daily.   gabapentin 300 MG capsule Commonly known as: NEURONTIN Take 1 capsule (300 mg total) by mouth 2 (two) times daily.   levothyroxine 100 MCG tablet Commonly known as: SYNTHROID Take 1 tablet (100 mcg total) by mouth daily before breakfast.   magnesium oxide 400 MG tablet Commonly known as: MAG-OX Take 0.5 tablets (200 mg total) by mouth daily.   melatonin 3 MG Tabs tablet Take 3 mg by mouth at bedtime as needed (Sleep).   nitroGLYCERIN 0.4 MG SL tablet Commonly known as: NITROSTAT DISSOLVE ONE TABLET UNDER THE TONGUE EVERY 5 MINUTES AS NEEDED FOR CHEST PAIN.  DO NOT EXCEED A TOTAL OF 3 DOSES IN 15 MINUTES   ondansetron 4 MG  disintegrating tablet Commonly known as: ZOFRAN-ODT Take 1 tablet (4 mg total) by mouth every 8 (eight) hours as needed for nausea or vomiting.   potassium chloride SA 20 MEQ tablet Commonly known as: KLOR-CON M Take 2 tablets (40 mEq total) by mouth daily.   spironolactone 25 MG tablet Commonly known as: ALDACTONE Take 1/2 (one-half) tablet by mouth once daily   Vitamin D3 50 MCG (2000 UT) Caps Generic drug: Cholecalciferol Take 1 capsule (2,000 Units total) by mouth daily.        Signature:  Chesley Mires, MD [place] Pager - [phone] 02/17/2023, 11:03 AM

## 2023-02-17 NOTE — Patient Instructions (Signed)
Follow up in 6 months 

## 2023-02-19 NOTE — Assessment & Plan Note (Signed)
Persistent A-fib vs atypical atrial flutter.  Regular rate and rhythm on exam today followed by electrophysiology (Dr. Myles Gip).  S/p DCCV in late January.  She is currently prescribed amiodarone 200 mg daily and I will close 5 mg daily.  Discussing ablation, however she was told she would need to lose at least 15-20 pounds prior to the procedure. -Cardiology follow-up scheduled for June -She has made lifestyle modifications aimed at losing weight and has lost 4 pounds since late February.

## 2023-02-19 NOTE — Assessment & Plan Note (Signed)
Noted on labs from January.  I have recommended that she resume daily vitamin D supplementation.

## 2023-02-19 NOTE — Assessment & Plan Note (Signed)
Chronic issue.  MRI lumbar spine pending to assess for spinal stenosis.  This was ordered by her neurologist.  Previously unable to complete physical therapy due to atrial fibrillation. -MRI pending -Recommended resuming PT as able

## 2023-02-19 NOTE — Assessment & Plan Note (Addendum)
Previously documented history of iron deficiency anemia.  Microcytic anemia noted on recent labs.  She is not currently on oral iron supplementation. -Repeat CBC and iron studies ordered today

## 2023-02-19 NOTE — Assessment & Plan Note (Signed)
BMI 37.8 today.  Needs to lose 15-20 pounds to improve candidacy for A-fib ablation.  She is following a diet that was provided by her endocrinologist (Dr. [name]) and has lost 4 pounds since late February.

## 2023-02-25 ENCOUNTER — Telehealth: Payer: Self-pay | Admitting: Neurology

## 2023-02-25 DIAGNOSIS — G8929 Other chronic pain: Secondary | ICD-10-CM

## 2023-02-25 DIAGNOSIS — M48062 Spinal stenosis, lumbar region with neurogenic claudication: Secondary | ICD-10-CM

## 2023-02-25 NOTE — Telephone Encounter (Signed)
-----   Message from Penni Bombard, MD sent at 02/24/2023  5:07 PM EDT ----- Mild bilateral L5 pinched nerves. Multi-level arthritis could contribute to back pain issues. Recommend PT evaluation. May consider pain mgmt consult with spine clinic. -VRP

## 2023-02-25 NOTE — Telephone Encounter (Signed)
Called the patient and reviewed the MRI lumbar spine. Reviewed the results from Dr Leta Baptist. Pt agreeable to restarting PT through [facility] outpt. Advised I will place the referral to them. Pt verbalized understanding. Pt had no questions at this time but was encouraged to call back if questions arise.

## 2023-02-28 LAB — CBC WITH DIFFERENTIAL/PLATELET
Basophils Absolute: 0.1 10*3/uL (ref 0.0–0.2)
Basos: 1 %
EOS (ABSOLUTE): 0.1 10*3/uL (ref 0.0–0.4)
Eos: 2 %
Hematocrit: 26.9 % — ABNORMAL LOW (ref 34.0–46.6)
Hemoglobin: 7.6 g/dL — ABNORMAL LOW (ref 11.1–15.9)
Immature Grans (Abs): 0 10*3/uL (ref 0.0–0.1)
Immature Granulocytes: 0 %
Lymphocytes Absolute: 2.3 10*3/uL (ref 0.7–3.1)
Lymphs: 34 %
MCH: 22.1 pg — ABNORMAL LOW (ref 26.6–33.0)
MCHC: 28.3 g/dL — ABNORMAL LOW (ref 31.5–35.7)
MCV: 78 fL — ABNORMAL LOW (ref 79–97)
Monocytes Absolute: 0.6 10*3/uL (ref 0.1–0.9)
Monocytes: 8 %
Neutrophils Absolute: 3.8 10*3/uL (ref 1.4–7.0)
Neutrophils: 55 %
Platelets: 404 10*3/uL (ref 150–450)
RBC: 3.44 x10E6/uL — ABNORMAL LOW (ref 3.77–5.28)
RDW: 19.7 % — ABNORMAL HIGH (ref 11.7–15.4)
WBC: 6.8 10*3/uL (ref 3.4–10.8)

## 2023-02-28 LAB — IRON,TIBC AND FERRITIN PANEL
Ferritin: 7 ng/mL — ABNORMAL LOW (ref 15–150)
Iron Saturation: 5 % — CL (ref 15–55)
Iron: 19 ug/dL — ABNORMAL LOW (ref 27–139)
Total Iron Binding Capacity: 371 ug/dL (ref 250–450)
UIBC: 352 ug/dL (ref 118–369)

## 2023-03-13 ENCOUNTER — Ambulatory Visit (HOSPITAL_COMMUNITY): Payer: Medicare Other | Attending: Internal Medicine | Admitting: Physical Therapy

## 2023-03-13 ENCOUNTER — Other Ambulatory Visit: Payer: Self-pay

## 2023-03-13 DIAGNOSIS — R29898 Other symptoms and signs involving the musculoskeletal system: Secondary | ICD-10-CM | POA: Diagnosis present

## 2023-03-13 DIAGNOSIS — M48062 Spinal stenosis, lumbar region with neurogenic claudication: Secondary | ICD-10-CM | POA: Diagnosis not present

## 2023-03-13 DIAGNOSIS — M5442 Lumbago with sciatica, left side: Secondary | ICD-10-CM | POA: Insufficient documentation

## 2023-03-13 DIAGNOSIS — G8929 Other chronic pain: Secondary | ICD-10-CM | POA: Diagnosis not present

## 2023-03-13 DIAGNOSIS — M6281 Muscle weakness (generalized): Secondary | ICD-10-CM | POA: Diagnosis not present

## 2023-03-13 DIAGNOSIS — M545 Low back pain, unspecified: Secondary | ICD-10-CM | POA: Insufficient documentation

## 2023-03-13 DIAGNOSIS — M5441 Lumbago with sciatica, right side: Secondary | ICD-10-CM | POA: Diagnosis not present

## 2023-03-13 NOTE — Therapy (Signed)
OUTPATIENT PHYSICAL THERAPY Back EVALUATION   Patient Name: Maria Fry MRN: [id number] DOB:1951-01-16, 72 y.o., female Today's Date: 03/13/2023  END OF SESSION:  PT End of Session - 03/13/23 1506     Visit Number 1    Number of Visits 12    Date for PT Re-Evaluation 04/24/23    Authorization Type [employer] medicare    Authorization - Visit Number 1    Authorization - Number of Visits 12    Progress Note Due on Visit 10    PT Start Time 1435    PT Stop Time 1507    PT Time Calculation (min) 32 min             Past Medical History:  Diagnosis Date   Arthritis    Right knee   Atrial fibrillation (HCC)    CAD (coronary artery disease)    a. 06/07/15 NSTEMI/Cath: Dist LAD 100%, mid LAD 10%. Otw nl cors. Nl EF w/ inferoapical AK.   Essential hypertension    Hip pain    History of cardiomyopathy 04/23/2016   a. 04/2016 Echo: EF 45%, Gr2 DD; b. 11/2019 Echo: EF 45-50%, mod LVH. Gr2 DD. Nl RV fxn. Sev dil LA.    Hyperlipidemia    Hypothyroidism    NSTEMI (non-ST elevated myocardial infarction) (HCC) 06/05/2015   Vitamin D deficiency    Past Surgical History:  Procedure Laterality Date   ABDOMINAL HYSTERECTOMY     partial- one ovary remaining   BIOPSY  09/17/2020   Procedure: BIOPSY;  Surgeon: Lanelle Bal, DO;  Location: [facility];  Service: Endoscopy;;   CARDIAC CATHETERIZATION N/A 06/06/2015   Procedure: Left Heart Cath and Coronary Angiography;  Surgeon: Peter M Swaziland, MD;  Location: [facility] INVASIVE CV LAB;  Service: Cardiovascular;  Laterality: N/A;   CARDIOVERSION N/A 09/14/2020   Procedure: CARDIOVERSION;  Surgeon: Jonelle Sidle, MD;  Location: [facility];  Service: Cardiovascular;  Laterality: N/A;   CARDIOVERSION N/A 06/20/2022   Procedure: CARDIOVERSION;  Surgeon: Antoine Poche, MD;  Location: [facility];  Service: Endoscopy;  Laterality: N/A;   CARDIOVERSION N/A 01/01/2023   Procedure: CARDIOVERSION;  Surgeon: Jodelle Red, MD;  Location: [facility]  ENDOSCOPY;  Service: Cardiovascular;  Laterality: N/A;   CHOLECYSTECTOMY     COLONOSCOPY     COLONOSCOPY N/A 08/13/2017   Procedure: COLONOSCOPY;  Surgeon: Corbin Ade, MD;  Location: [facility];  Service: Endoscopy;  Laterality: N/A;  8:30 AM   COLONOSCOPY WITH PROPOFOL N/A 01/18/2021   [name]: Multiple tubular adenomas removed, next colonoscopy in 3 years   ESOPHAGOGASTRODUODENOSCOPY N/A 09/11/2016   Procedure: ESOPHAGOGASTRODUODENOSCOPY (EGD);  Surgeon: Corbin Ade, MD;  Location: [facility];  Service: Endoscopy;  Laterality: N/A;  7:45 am - moved to 10/11 @ 10:30   ESOPHAGOGASTRODUODENOSCOPY (EGD) WITH PROPOFOL N/A 09/17/2020   [name]: Diffuse moderate inflammation characterized by erosions and erythema found in the entire stomach.  Biopsies positive for H. pylori.  Patient has not been treated due to drug allergies.   Heel spurs Left    MALONEY DILATION N/A 09/11/2016   Procedure: [name] DILATION;  Surgeon: Corbin Ade, MD;  Location: [facility];  Service: Endoscopy;  Laterality: N/A;   POLYPECTOMY  08/13/2017   Procedure: POLYPECTOMY;  Surgeon: Corbin Ade, MD;  Location: [facility];  Service: Endoscopy;;  colon   POLYPECTOMY  01/18/2021   Procedure: POLYPECTOMY;  Surgeon: Corbin Ade, MD;  Location: [facility];  Service: Endoscopy;;   SHOULDER ARTHROSCOPY Right    TUBAL LIGATION     Patient Active Problem List   Diagnosis Date Noted   Meralgia paresthetica 11/20/2022   Lumbar back pain 09/25/2022   Encounter for general adult medical examination with abnormal findings 09/25/2022   Refractory angina 07/01/2022   Elevated brain natriuretic peptide (BNP) level 06/19/2022   Insomnia 06/19/2022   Chest pain 06/18/2022   Hypomagnesemia 09/05/2021   Wheezing 03/06/2021   Sleep apnea 02/06/2021   Vitamin D insufficiency 01/30/2021   Encounter to establish care 01/30/2021   Helicobacter pylori gastritis 11/01/2020   H/O adenomatous polyp of colon  10/31/2020   Constipation 10/15/2020   History of GI bleed 10/15/2020   Persistent atrial fibrillation 10/10/2020   Hypercoagulable state due to persistent atrial fibrillation 10/05/2020   Obesity, Class III, BMI 40-49.9 (morbid obesity)    Gastroesophageal reflux disease    Gastritis    Acquired hypothyroidism    Melena    CHF exacerbation 09/15/2020   Atrial fibrillation, chronic 08/23/2020   Stable angina 01/14/2020   Abnormal nuclear stress test 12/08/2019   Iron deficiency anemia 11/28/2019   Mixed hyperlipidemia 11/27/2019   OA (osteoarthritis) of knee 05/18/2019   DDD (degenerative disc disease), lumbar 05/18/2019   Obesity 12/26/2017   Seasonal allergies 08/19/2016   Glucose intolerance (impaired glucose tolerance) 05/14/2016   Chronic hip pain 05/14/2016   Chronic combined systolic and diastolic heart failure 05/14/2016   Hypothyroidism following radioiodine therapy 11/30/2015   CAD (coronary artery disease)    NSTEMI (non-ST elevated myocardial infarction) (HCC)    Essential hypertension 06/05/2015    PCP: Christel Mormon  REFERRING PROVIDER: Suanne Marker, MD  REFERRING DIAG: M54.42,M54.41,G89.29 (ICD-10-CM) - Chronic bilateral low back pain with bilateral sciatica M48.062 (ICD-10-CM) - Spinal stenosis of lumbar region with neurogenic claudication  THERAPY DIAG:  Muscle weakness (generalized)  Low back pain, unspecified back pain laterality, unspecified chronicity, unspecified whether sciatica present  Other symptoms and signs involving the musculoskeletal system  Rationale for Evaluation and Treatment: Rehabilitation  ONSET DATE: 08/17/2023 acute exacerbation    SUBJECTIVE STATEMENT: Chronic low back pain but now is having daily pain. Increased with standing and walking; has trouble grocery shopping, cooking etc. 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 MD gave her flexeril; didn't help,  came to therapy for four visits but had to stop as her heart went into A-Fib.  Pt has almost immediate pain upon wt bearing.  Able to stand for  15   ; walking 5 minutes.   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 Are you having pain? Yes: NPRS scale: 7/10; worst is past 10, best 0 if not wt bearing l. Pain location: pain in low back and left side Pain description: aching, sore Aggravating factors: mornings, prolonged stand or walk Relieving factors: sit in recliner and don't move PRECAUTIONS: None  WEIGHT BEARING RESTRICTIONS: No  FALLS:  Has patient fallen in last 6 months? No  LIVING ENVIRONMENT: Lives with: lives with their family Lives in: Mobile home Stairs: Yes: External: 5 steps; can reach both Has following equipment at home: Single point cane and Wheelchair (manual)  OCCUPATION: retired   PLOF: Independent  PATIENT GOALS: less pain and to be able to stand and walk longer.  NEXT MD VISIT: not scheduled  OBJECTIVE:   DIAGNOSTIC FINDINGS:  Narrative & Impression  CLINICAL DATA:  Chronic bilateral low back pain with bilateral sciatica     COMPARISON:  No prior MRI of the lumbar spine is available, an MRI IMPRESSION: 1. L4-L5 mild bilateral neural foraminal narrowing. Narrowing of the lateral recesses at this level could affect the descending L5 nerve roots. 2. Narrowing of the right lateral recess at L2-L3, which could affect the descending right L3 nerve roots. 3. Multilevel facet arthropathy, which is moderate at L1-L2 and L2-L3 bilaterally and on the right at L4-L5 and L5-S1, which can be a cause of back pain. In addition, medially directed facet synovial cysts are noted on the right at L1-L2 and L2-L3, indenting on the posterior aspect of the thecal sac, but not causing significant spinal canal stenosis.     Electronically Signed   By: Wiliam KeAlison  Vasan M.D.    On: 02/14/2023 13:31      PATIENT SURVEYS:  FOTO 48  COGNITION: Overall cognitive status: Within functional limits for tasks assessed    MUSCLE LENGTH: Hamstrings: Right 140 deg; Left 130 deg   POSTURE:  rounded shoulders, forward head, decreased lumbar lordosis, and increased thoracic kyphosis    Trunk ROM: extension: 12 degrees with reps increasing pain                      Flexion:  fingers just below knees with increased pain.     LOWER EXTREMITY MMT:  MMT Right eval Left eval  Hip flexion 5 2+  Hip extension 2 2  Hip abduction 5 3  Hip adduction    Hip internal rotation    Hip external rotation    Knee flexion 3+ 3+  Knee extension 5 4  Ankle dorsiflexion 4 4  Ankle plantarflexion    Ankle inversion    Ankle eversion     (Blank rows = not tested) FUNCTIONAL TESTS: 03/13/23 30 seconds chair stand test: 7; 8 is poor for pt age and sex 2 minute walk test: 294 with significant increased pain.     TODAY'S TREATMENT:                                                                                                                              DATE: 03/12/23:    Knee to chest 3 x 30" B Double knee to chest 3 x 30"   PATIENT EDUCATION:  Education details: HPE Person educated: Patient Education method: Explanation Education comprehension: returned demonstration  HOME EXERCISE PROGRAM: Access Code: [email or web address] URL: [email or web address] Date: 03/13/2023 Prepared by: Virgina Organynthia Luretha Eberly  Exercises - Supine Single Knee to Chest Stretch  - 2 x daily - 7 x weekly - 3 sets - 3 reps - 30" hold - Supine Double Knee to Chest  - 2 x daily - 7 x weekly - 3 sets - 3 reps - 30" hold  ASSESSMENT:  CLINICAL IMPRESSION:Patient is a 72 y.o. female  who was seen today for physical therapy evaluation and treatment for  chronic bilateral low back pain without sciatica.Patient demonstrates muscle weakness, reduced ROM, decreased activity tolerance and increased pain which  are likely contributing to symptoms of pain and are negatively impacting patient ability to perform ADLs and functional mobility tasks. Patient will benefit from skilled physical therapy services to address these deficits to reduce pain and improve level of function with ADLs and functional mobility tasks.  OBJECTIVE IMPAIRMENTS: .   Abnormal gait, cardiopulmonary status limiting activity, decreased activity tolerance, decreased endurance, decreased mobility, difficulty walking, decreased ROM, decreased strength, hypomobility, increased fascial restrictions, impaired perceived functional ability, increased muscle spasms, impaired flexibility, impaired sensation, and pain.  ACTIVITY LIMITATIONS:  carrying, lifting, bending, sitting, standing, squatting, sleeping, stairs, transfers, bed mobility, continence, bathing, locomotion level, and caring for others  PARTICIPATION LIMITATIONS:  carrying, lifting, bending, sitting, standing, squatting, sleeping, stairs, transfers, bed mobility, continence, bathing, locomotion level, and caring for others  PERSONAL FACTORS: Time since onset of injury/illness/exacerbation are also affecting patient's functional outcome.   REHAB POTENTIAL: Fair    CLINICAL DECISION MAKING: Evolving/moderate complexity  EVALUATION COMPLEXITY: Moderate   GOALS: Goals reviewed with patient? No  SHORT TERM GOALS: Target date: 04/03/23 Pt to be I in HEP in order to decrease her pain to no greater than a 7/10 Baseline: Goal status: INITIAL  2.  Pt to be able to stand for 15 minutes without increased pain  Baseline:  Goal status: INITIAL  3.  Pt to be able to walk for 15 minutes without increased pain  Baseline:  Goal status: INITIAL    LONG TERM GOALS: Target date: 04/24/23  Pt to be I in an advanced HEP in order to decrease her pain to no greater than a 4/10 Baseline:  Goal status: INITIAL  2.   Pt to be able to stand for 25 minutes without increased pain to make a  meal  Baseline:  Goal status: INITIAL  3.  Pt to be able to walk for 30 minutes without increased pain to allow pt to grocery shop in comfort.  Baseline:  Goal status: INITIAL  4.  PT mm strength to have increased 1 grade to be able to come from sit to stand with ease.  Baseline:  Goal status: INITIAL     PLAN:  PT FREQUENCY: 2x/week  PT DURATION: 6 weeks  PLANNED INTERVENTIONS: Therapeutic exercises, Therapeutic activity, Neuromuscular re-education, Balance training, Gait training, Patient/Family education, Self Care, and Joint mobilization  PLAN FOR NEXT SESSION: begin lumbar stabilization program.   Virgina Organ, PT CLT [phone]  03/13/2023, [phone]

## 2023-03-17 ENCOUNTER — Ambulatory Visit (HOSPITAL_COMMUNITY): Payer: Medicare Other | Admitting: Physical Therapy

## 2023-03-17 DIAGNOSIS — M6281 Muscle weakness (generalized): Secondary | ICD-10-CM | POA: Diagnosis not present

## 2023-03-17 DIAGNOSIS — M545 Low back pain, unspecified: Secondary | ICD-10-CM

## 2023-03-17 NOTE — Therapy (Signed)
OUTPATIENT PHYSICAL THERAPY TREATMENT   Patient Name: Maria Fry MRN: [id number] DOB:Jul 09, 1951, 72 y.o., female Today's Date: 03/17/2023  END OF SESSION:  PT End of Session - 03/17/23 1440     Visit Number 2    Number of Visits 12    Date for PT Re-Evaluation 04/24/23    Authorization Type [employer] medicare    Authorization - Number of Visits 12    Progress Note Due on Visit 10    PT Start Time 1435    PT Stop Time 1514    PT Time Calculation (min) 39 min             Past Medical History:  Diagnosis Date   Arthritis    Right knee   Atrial fibrillation (HCC)    CAD (coronary artery disease)    a. 06/07/15 NSTEMI/Cath: Dist LAD 100%, mid LAD 10%. Otw nl cors. Nl EF w/ inferoapical AK.   Essential hypertension    Hip pain    History of cardiomyopathy 04/23/2016   a. 04/2016 Echo: EF 45%, Gr2 DD; b. 11/2019 Echo: EF 45-50%, mod LVH. Gr2 DD. Nl RV fxn. Sev dil LA.    Hyperlipidemia    Hypothyroidism    NSTEMI (non-ST elevated myocardial infarction) (HCC) 06/05/2015   Vitamin D deficiency    Past Surgical History:  Procedure Laterality Date   ABDOMINAL HYSTERECTOMY     partial- one ovary remaining   BIOPSY  09/17/2020   Procedure: BIOPSY;  Surgeon: Lanelle Bal, DO;  Location: [facility];  Service: Endoscopy;;   CARDIAC CATHETERIZATION N/A 06/06/2015   Procedure: Left Heart Cath and Coronary Angiography;  Surgeon: Peter M Swaziland, MD;  Location: [facility] INVASIVE CV LAB;  Service: Cardiovascular;  Laterality: N/A;   CARDIOVERSION N/A 09/14/2020   Procedure: CARDIOVERSION;  Surgeon: Jonelle Sidle, MD;  Location: [facility];  Service: Cardiovascular;  Laterality: N/A;   CARDIOVERSION N/A 06/20/2022   Procedure: CARDIOVERSION;  Surgeon: Antoine Poche, MD;  Location: [facility];  Service: Endoscopy;  Laterality: N/A;   CARDIOVERSION N/A 01/01/2023   Procedure: CARDIOVERSION;  Surgeon: Jodelle Red, MD;  Location: [facility];  Service: Cardiovascular;   Laterality: N/A;   CHOLECYSTECTOMY     COLONOSCOPY     COLONOSCOPY N/A 08/13/2017   Procedure: COLONOSCOPY;  Surgeon: Corbin Ade, MD;  Location: [facility];  Service: Endoscopy;  Laterality: N/A;  8:30 AM   COLONOSCOPY WITH PROPOFOL N/A 01/18/2021   [name]: Multiple tubular adenomas removed, next colonoscopy in 3 years   ESOPHAGOGASTRODUODENOSCOPY N/A 09/11/2016   Procedure: ESOPHAGOGASTRODUODENOSCOPY (EGD);  Surgeon: Corbin Ade, MD;  Location: [facility];  Service: Endoscopy;  Laterality: N/A;  7:45 am - moved to 10/11 @ 10:30   ESOPHAGOGASTRODUODENOSCOPY (EGD) WITH PROPOFOL N/A 09/17/2020   [name]: Diffuse moderate inflammation characterized by erosions and erythema found in the entire stomach.  Biopsies positive for H. pylori.  Patient has not been treated due to drug allergies.   Heel spurs Left    MALONEY DILATION N/A 09/11/2016   Procedure: [name] DILATION;  Surgeon: Corbin Ade, MD;  Location: [facility];  Service: Endoscopy;  Laterality: N/A;   POLYPECTOMY  08/13/2017   Procedure: POLYPECTOMY;  Surgeon: Corbin Ade, MD;  Location: [facility];  Service: Endoscopy;;  colon   POLYPECTOMY  01/18/2021   Procedure: POLYPECTOMY;  Surgeon: Corbin Ade, MD;  Location: [facility];  Service: Endoscopy;;   SHOULDER ARTHROSCOPY Right  TUBAL LIGATION     Patient Active Problem List   Diagnosis Date Noted   Meralgia paresthetica 11/20/2022   Lumbar back pain 09/25/2022   Encounter for general adult medical examination with abnormal findings 09/25/2022   Refractory angina 07/01/2022   Elevated brain natriuretic peptide (BNP) level 06/19/2022   Insomnia 06/19/2022   Chest pain 06/18/2022   Hypomagnesemia 09/05/2021   Wheezing 03/06/2021   Sleep apnea 02/06/2021   Vitamin D insufficiency 01/30/2021   Encounter to establish care 01/30/2021   Helicobacter pylori gastritis 11/01/2020   H/O adenomatous polyp of colon 10/31/2020   Constipation 10/15/2020    History of GI bleed 10/15/2020   Persistent atrial fibrillation 10/10/2020   Hypercoagulable state due to persistent atrial fibrillation 10/05/2020   Obesity, Class III, BMI 40-49.9 (morbid obesity)    Gastroesophageal reflux disease    Gastritis    Acquired hypothyroidism    Melena    CHF exacerbation 09/15/2020   Atrial fibrillation, chronic 08/23/2020   Stable angina 01/14/2020   Abnormal nuclear stress test 12/08/2019   Iron deficiency anemia 11/28/2019   Mixed hyperlipidemia 11/27/2019   OA (osteoarthritis) of knee 05/18/2019   DDD (degenerative disc disease), lumbar 05/18/2019   Obesity 12/26/2017   Seasonal allergies 08/19/2016   Glucose intolerance (impaired glucose tolerance) 05/14/2016   Chronic hip pain 05/14/2016   Chronic combined systolic and diastolic heart failure 05/14/2016   Hypothyroidism following radioiodine therapy 11/30/2015   CAD (coronary artery disease)    NSTEMI (non-ST elevated myocardial infarction) (HCC)    Essential hypertension 06/05/2015    PCP: Christel Mormon  REFERRING PROVIDER: Suanne Marker, MD  REFERRING DIAG: M54.42,M54.41,G89.29 (ICD-10-CM) - Chronic bilateral low back pain with bilateral sciatica M48.062 (ICD-10-CM) - Spinal stenosis of lumbar region with neurogenic claudication  THERAPY DIAG:  Muscle weakness (generalized)  Low back pain, unspecified back pain laterality, unspecified chronicity, unspecified whether sciatica present  Rationale for Evaluation and Treatment: Rehabilitation  ONSET DATE: 08/17/2023 acute exacerbation    SUBJECTIVE STATEMENT: PT reports compliance with HEP.  States she cooked over the weekend and back started hurting to 6-7/10 after 20 minutes so had to sit down and rest to decrease the pain.  Currently without pain.   Evaluation: Chronic low back pain but now is having daily pain. Increased with standing and walking; has trouble grocery shopping, cooking etc. Very stiff in the mornings; most of  pain is low back and to the left side; some neuropathy in right hip. MD gave her flexeril; didn't help, came to therapy for four visits but had to stop as her heart went into A-Fib.  Pt has almost immediate pain upon wt bearing.  Able to stand for  15   ; walking 5 minutes.   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 Are you having pain? Yes: NPRS scale: 7/10; worst is past 10, best 0 if not wt bearing l. Pain location: pain in low back and left side Pain description: aching, sore Aggravating factors: mornings, prolonged stand or walk Relieving factors: sit in recliner and don't move PRECAUTIONS: None  WEIGHT BEARING RESTRICTIONS: No  FALLS:  Has patient fallen in last 6 months? No  LIVING ENVIRONMENT: Lives with: lives with their family Lives in: Mobile home Stairs: Yes: External: 5 steps; can reach both Has following equipment at home: Single point cane and Wheelchair (manual)  OCCUPATION: retired   PLOF: Independent  PATIENT GOALS: less pain and to be able to stand and walk longer.  NEXT MD VISIT: not scheduled   OBJECTIVE:   DIAGNOSTIC FINDINGS:  Narrative & Impression  CLINICAL DATA:  Chronic bilateral low back pain with bilateral sciatica     COMPARISON:  No prior MRI of the lumbar spine is available, an MRI IMPRESSION: 1. L4-L5 mild bilateral neural foraminal narrowing. Narrowing of the lateral recesses at this level could affect the descending L5 nerve roots. 2. Narrowing of the right lateral recess at L2-L3, which could affect the descending right L3 nerve roots. 3. Multilevel facet arthropathy, which is moderate at L1-L2 and L2-L3 bilaterally and on the right at L4-L5 and L5-S1, which can be a cause of back pain. In addition, medially directed facet synovial cysts are noted on the right at L1-L2 and L2-L3, indenting on the posterior aspect of the thecal sac, but not  causing significant spinal canal stenosis.     Electronically Signed   By: Wiliam Ke M.D.   On: 02/14/2023 13:31      PATIENT SURVEYS:  FOTO 48  COGNITION: Overall cognitive status: Within functional limits for tasks assessed    MUSCLE LENGTH: Hamstrings: Right 140 deg; Left 130 deg   POSTURE:  rounded shoulders, forward head, decreased lumbar lordosis, and increased thoracic kyphosis    Trunk ROM: extension: 12 degrees with reps increasing pain                      Flexion:  fingers just below knees with increased pain.     LOWER EXTREMITY MMT:  MMT Right eval Left eval  Hip flexion 5 2+  Hip extension 2 2  Hip abduction 5 3  Hip adduction    Hip internal rotation    Hip external rotation    Knee flexion 3+ 3+  Knee extension 5 4  Ankle dorsiflexion 4 4  Ankle plantarflexion    Ankle inversion    Ankle eversion     (Blank rows = not tested) FUNCTIONAL TESTS: 03/13/23 30 seconds chair stand test: 7; 8 is poor for pt age and sex 2 minute walk test: 294 with significant increased pain.     TODAY'S TREATMENT:                                                                                                                              DATE:  03/17/23:    Supine: Knee to chest 3 x 30" B with towel Double knee to chest  (unable to complete today) Bridge 10X SLR 10X each Hamstring stretch with towel 2X30" each with towel Sidelying hip abduction 10X each Prone:  heelsqueezes 10X5"  Knee flexion 10X each  Hip extensions 10X  03/12/23:    Knee to chest 3 x 30" B Double knee to chest 3 x 30"   PATIENT EDUCATION:  Education details: HPE Person educated: Patient Education method: Explanation Education comprehension: returned  demonstration  HOME EXERCISE PROGRAM: Access Code: [email or web address] URL: [email or web address] Date: 03/13/2023 Prepared by: Virgina Organ  Exercises - Supine Single Knee to Chest Stretch  - 2 x daily - 7 x weekly - 3  sets - 3 reps - 30" hold - Supine Double Knee to Chest  - 2 x daily - 7 x weekly - 3 sets - 3 reps - 30" hold  ASSESSMENT:  CLINICAL IMPRESSION:   Reviewed goals and POC moving forward.  Progressed with LE strengthening and stretching.  Pt able to recall form of therex, however did not demonstrate correct hold times/reps as instructed for HEP.  Pt was unable to complete double knee to chest even with use of towel as too difficult/weakness and also cramping. Several instances of cramping in calves/thighs with exercises today.  No new exercises added to HEP.  Pt will continue to benefit from skilled therapy to address deficits, reduce pain and improve level of function with ADLs and functional mobility tasks.   OBJECTIVE IMPAIRMENTS: .   Abnormal gait, cardiopulmonary status limiting activity, decreased activity tolerance, decreased endurance, decreased mobility, difficulty walking, decreased ROM, decreased strength, hypomobility, increased fascial restrictions, impaired perceived functional ability, increased muscle spasms, impaired flexibility, impaired sensation, and pain.  ACTIVITY LIMITATIONS:  carrying, lifting, bending, sitting, standing, squatting, sleeping, stairs, transfers, bed mobility, continence, bathing, locomotion level, and caring for others  PARTICIPATION LIMITATIONS:  carrying, lifting, bending, sitting, standing, squatting, sleeping, stairs, transfers, bed mobility, continence, bathing, locomotion level, and caring for others  PERSONAL FACTORS: Time since onset of injury/illness/exacerbation are also affecting patient's functional outcome.   REHAB POTENTIAL: Fair    CLINICAL DECISION MAKING: Evolving/moderate complexity  EVALUATION COMPLEXITY: Moderate   GOALS: Goals reviewed with patient? No  SHORT TERM GOALS: Target date: 04/03/23 Pt to be I in HEP in order to decrease her pain to no greater than a 7/10 Baseline: Goal status: IN PROGRESS  2.  Pt to be able to stand  for 15 minutes without increased pain  Baseline:  Goal status: IN PROGRESS  3.  Pt to be able to walk for 15 minutes without increased pain  Baseline:  Goal status: IN PROGRESS    LONG TERM GOALS: Target date: 04/24/23  Pt to be I in an advanced HEP in order to decrease her pain to no greater than a 4/10 Baseline:  Goal status: IN PROGRESS  2.   Pt to be able to stand for 25 minutes without increased pain to make a meal  Baseline:  Goal status: IN PROGRESS  3.  Pt to be able to walk for 30 minutes without increased pain to allow pt to grocery shop in comfort.  Baseline:  Goal status: IN PROGRESS  4.  PT mm strength to have increased 1 grade to be able to come from sit to stand with ease.  Baseline:  Goal status: IN PROGRESS     PLAN:  PT FREQUENCY: 2x/week  PT DURATION: 6 weeks  PLANNED INTERVENTIONS: Therapeutic exercises, Therapeutic activity, Neuromuscular re-education, Balance training, Gait training, Patient/Family education, Self Care, and Joint mobilization  PLAN FOR NEXT SESSION:Progress lumbar stabilization program.  Update HEP weekly or PRN.  Lurena Nida, PTA/CLT [facility] Ph: [phone]   Lurena Nida, PTA 03/17/2023, 5:01 PM

## 2023-03-24 ENCOUNTER — Ambulatory Visit (HOSPITAL_COMMUNITY): Payer: Medicare Other | Admitting: Physical Therapy

## 2023-03-24 DIAGNOSIS — R29898 Other symptoms and signs involving the musculoskeletal system: Secondary | ICD-10-CM

## 2023-03-24 DIAGNOSIS — M6281 Muscle weakness (generalized): Secondary | ICD-10-CM | POA: Diagnosis not present

## 2023-03-24 DIAGNOSIS — M545 Low back pain, unspecified: Secondary | ICD-10-CM

## 2023-03-24 NOTE — Therapy (Signed)
OUTPATIENT PHYSICAL THERAPY TREATMENT   Patient Name: Maria Fry MRN: [id number] DOB:Apr 09, 1951, 72 y.o., female Today's Date: 03/24/2023  END OF SESSION:  PT End of Session - 03/24/23 1025     Visit Number 3    Number of Visits 12    Date for PT Re-Evaluation 04/24/23    Authorization Type [employer] medicare    Authorization - Visit Number 2    Authorization - Number of Visits 12    Progress Note Due on Visit 10    PT Start Time 0945    PT Stop Time 1025    PT Time Calculation (min) 40 min    Activity Tolerance Patient tolerated treatment well              Past Medical History:  Diagnosis Date   Arthritis    Right knee   Atrial fibrillation (HCC)    CAD (coronary artery disease)    a. 06/07/15 NSTEMI/Cath: Dist LAD 100%, mid LAD 10%. Otw nl cors. Nl EF w/ inferoapical AK.   Essential hypertension    Hip pain    History of cardiomyopathy 04/23/2016   a. 04/2016 Echo: EF 45%, Gr2 DD; b. 11/2019 Echo: EF 45-50%, mod LVH. Gr2 DD. Nl RV fxn. Sev dil LA.    Hyperlipidemia    Hypothyroidism    NSTEMI (non-ST elevated myocardial infarction) (HCC) 06/05/2015   Vitamin D deficiency    Past Surgical History:  Procedure Laterality Date   ABDOMINAL HYSTERECTOMY     partial- one ovary remaining   BIOPSY  09/17/2020   Procedure: BIOPSY;  Surgeon: Lanelle Bal, DO;  Location: [facility];  Service: Endoscopy;;   CARDIAC CATHETERIZATION N/A 06/06/2015   Procedure: Left Heart Cath and Coronary Angiography;  Surgeon: Peter M Swaziland, MD;  Location: [facility] INVASIVE CV LAB;  Service: Cardiovascular;  Laterality: N/A;   CARDIOVERSION N/A 09/14/2020   Procedure: CARDIOVERSION;  Surgeon: Jonelle Sidle, MD;  Location: [facility];  Service: Cardiovascular;  Laterality: N/A;   CARDIOVERSION N/A 06/20/2022   Procedure: CARDIOVERSION;  Surgeon: Antoine Poche, MD;  Location: [facility];  Service: Endoscopy;  Laterality: N/A;   CARDIOVERSION N/A 01/01/2023   Procedure: CARDIOVERSION;   Surgeon: Jodelle Red, MD;  Location: [facility] ENDOSCOPY;  Service: Cardiovascular;  Laterality: N/A;   CHOLECYSTECTOMY     COLONOSCOPY     COLONOSCOPY N/A 08/13/2017   Procedure: COLONOSCOPY;  Surgeon: Corbin Ade, MD;  Location: [facility];  Service: Endoscopy;  Laterality: N/A;  8:30 AM   COLONOSCOPY WITH PROPOFOL N/A 01/18/2021   [name]: Multiple tubular adenomas removed, next colonoscopy in 3 years   ESOPHAGOGASTRODUODENOSCOPY N/A 09/11/2016   Procedure: ESOPHAGOGASTRODUODENOSCOPY (EGD);  Surgeon: Corbin Ade, MD;  Location: [facility];  Service: Endoscopy;  Laterality: N/A;  7:45 am - moved to 10/11 @ 10:30   ESOPHAGOGASTRODUODENOSCOPY (EGD) WITH PROPOFOL N/A 09/17/2020   [name]: Diffuse moderate inflammation characterized by erosions and erythema found in the entire stomach.  Biopsies positive for H. pylori.  Patient has not been treated due to drug allergies.   Heel spurs Left    MALONEY DILATION N/A 09/11/2016   Procedure: [name] DILATION;  Surgeon: Corbin Ade, MD;  Location: [facility];  Service: Endoscopy;  Laterality: N/A;   POLYPECTOMY  08/13/2017   Procedure: POLYPECTOMY;  Surgeon: Corbin Ade, MD;  Location: [facility];  Service: Endoscopy;;  colon   POLYPECTOMY  01/18/2021   Procedure: POLYPECTOMY;  Surgeon: Jena Gauss,  Gerrit Friends, MD;  Location: [facility];  Service: Endoscopy;;   SHOULDER ARTHROSCOPY Right    TUBAL LIGATION     Patient Active Problem List   Diagnosis Date Noted   Meralgia paresthetica 11/20/2022   Lumbar back pain 09/25/2022   Encounter for general adult medical examination with abnormal findings 09/25/2022   Refractory angina 07/01/2022   Elevated brain natriuretic peptide (BNP) level 06/19/2022   Insomnia 06/19/2022   Chest pain 06/18/2022   Hypomagnesemia 09/05/2021   Wheezing 03/06/2021   Sleep apnea 02/06/2021   Vitamin D insufficiency 01/30/2021   Encounter to establish care 01/30/2021   Helicobacter pylori gastritis  11/01/2020   H/O adenomatous polyp of colon 10/31/2020   Constipation 10/15/2020   History of GI bleed 10/15/2020   Persistent atrial fibrillation 10/10/2020   Hypercoagulable state due to persistent atrial fibrillation 10/05/2020   Obesity, Class III, BMI 40-49.9 (morbid obesity)    Gastroesophageal reflux disease    Gastritis    Acquired hypothyroidism    Melena    CHF exacerbation 09/15/2020   Atrial fibrillation, chronic 08/23/2020   Stable angina 01/14/2020   Abnormal nuclear stress test 12/08/2019   Iron deficiency anemia 11/28/2019   Mixed hyperlipidemia 11/27/2019   OA (osteoarthritis) of knee 05/18/2019   DDD (degenerative disc disease), lumbar 05/18/2019   Obesity 12/26/2017   Seasonal allergies 08/19/2016   Glucose intolerance (impaired glucose tolerance) 05/14/2016   Chronic hip pain 05/14/2016   Chronic combined systolic and diastolic heart failure 05/14/2016   Hypothyroidism following radioiodine therapy 11/30/2015   CAD (coronary artery disease)    NSTEMI (non-ST elevated myocardial infarction) (HCC)    Essential hypertension 06/05/2015    PCP: Christel Mormon  REFERRING PROVIDER: Suanne Marker, MD  REFERRING DIAG: M54.42,M54.41,G89.29 (ICD-10-CM) - Chronic bilateral low back pain with bilateral sciatica M48.062 (ICD-10-CM) - Spinal stenosis of lumbar region with neurogenic claudication  THERAPY DIAG:  Muscle weakness (generalized)  Low back pain, unspecified back pain laterality, unspecified chronicity, unspecified whether sciatica present  Other symptoms and signs involving the musculoskeletal system  Rationale for Evaluation and Treatment: Rehabilitation  ONSET DATE: 08/17/2023 acute exacerbation    SUBJECTIVE STATEMENT: PT has no questions on HEP.  She has no pain but is sore.   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 Are you having pain? Yes:  0 Pain location: pain in low back and left side Pain description: aching, sore Aggravating factors: mornings, prolonged stand or walk Relieving factors: sit in recliner and don't move PRECAUTIONS: None  WEIGHT BEARING RESTRICTIONS: No  FALLS:  Has patient fallen in last 6 months? No  LIVING ENVIRONMENT: Lives with: lives with their family Lives in: Mobile home Stairs: Yes: External: 5 steps; can reach both Has following equipment at home: Single point cane and Wheelchair (manual)  OCCUPATION: retired   PLOF: Independent  PATIENT GOALS: less pain and to be able to stand and walk longer.  NEXT MD VISIT: not scheduled   OBJECTIVE:   DIAGNOSTIC FINDINGS:  Narrative & Impression  CLINICAL DATA:  Chronic bilateral low back pain with bilateral sciatica     COMPARISON:  No prior MRI of the lumbar spine is available, an MRI IMPRESSION: 1. L4-L5 mild bilateral neural foraminal narrowing. Narrowing of the lateral recesses at this level could affect the descending L5 nerve roots. 2. Narrowing of the right lateral recess at L2-L3, which could affect the  descending right L3 nerve roots. 3. Multilevel facet arthropathy, which is moderate at L1-L2 and L2-L3 bilaterally and on the right at L4-L5 and L5-S1, which can be a cause of back pain. In addition, medially directed facet synovial cysts are noted on the right at L1-L2 and L2-L3, indenting on the posterior aspect of the thecal sac, but not causing significant spinal canal stenosis.     Electronically Signed   By: Wiliam Ke M.D.   On: 02/14/2023 13:31      PATIENT SURVEYS:  FOTO 48  COGNITION: Overall cognitive status: Within functional limits for tasks assessed    MUSCLE LENGTH: Hamstrings: Right 140 deg; Left 130 deg   POSTURE:  rounded shoulders, forward head, decreased lumbar lordosis, and increased thoracic kyphosis    Trunk ROM: extension: 12 degrees with reps increasing pain                       Flexion:  fingers just below knees with increased pain.     LOWER EXTREMITY MMT:  MMT Right eval Left eval  Hip flexion 5 2+  Hip extension 2 2  Hip abduction 5 3  Hip adduction    Hip internal rotation    Hip external rotation    Knee flexion 3+ 3+  Knee extension 5 4  Ankle dorsiflexion 4 4  Ankle plantarflexion    Ankle inversion    Ankle eversion     (Blank rows = not tested) FUNCTIONAL TESTS: 03/13/23 30 seconds chair stand test: 7; 8 is poor for pt age and sex 2 minute walk test: 294 with significant increased pain.     TODAY'S TREATMENT:                                                                                                                              DATE:  03/24/23 Wall arch x 10 Functional squat x 10  Hip excursion x 2 Sitting : tall posture  hold for 5 " x t Side lying: Clam x 10 B  Hip abduction x 10  Supine:  Knee to chest x 3 for 30" Double knee to chest 3 x 30" Hamstring stretch 3 x 30"  Bridge x 15  Bent knee lift x 10  Prone:   Heel squeeze x 10 Single leg raise x 10  03/17/23:    Supine: Knee to chest 3 x 30" B with towel Double knee to chest  (unable to complete today) Bridge 10X SLR 10X each Hamstring stretch with towel 2X30" each with towel Sidelying hip abduction 10X each Prone:  heelsqueezes 10X5"  Knee flexion 10X each  Hip extensions 10X  03/12/23:    Knee to chest 3 x 30" B Double knee to chest 3 x 30"   PATIENT EDUCATION:  Education details: HPE Person educated: Patient Education method: Explanation Education comprehension: returned demonstration  HOME EXERCISE PROGRAM: 03/24/23  Supine Bridge  - 1 x  daily - 7 x weekly - 1 sets - 10 reps - 3-5" hold - Sidelying Hip Abduction  - 1 x daily - 7 x weekly - 1 sets - 10 reps - 3-5" hold - Clamshell  - 1 x daily - 7 x weekly - 1 sets - 10 reps - 3-5" hold - Heel Raises with Counter Support  - 1 x daily - 7 x weekly - 1 sets - 10 reps - 3-5 hold - Mini Squat  - 1 x daily  - 7 x weekly - 1 sets - 10 reps - 3-5 hold - Seated Transversus Abdominis Bracing  - 10 x daily - 7 x weekly - 1 sets - 10 reps - 3-5" hold Access Code: [email or web address] URL: [email or web address] Date: 03/13/2023 Prepared by: Virgina Organ  Exercises - Supine Single Knee to Chest Stretch  - 2 x daily - 7 x weekly - 3 sets - 3 reps - 30" hold - Supine Double Knee to Chest  - 2 x daily - 7 x weekly - 3 sets - 3 reps - 30" hold  ASSESSMENT:  CLINICAL IMPRESSION:    Continued to progress LE and core strengthening.  Pt with noted improved standing posture.  Therapist added new exercises to HEP.  Pt will continue to benefit from skilled therapy to address deficits, reduce pain and improve level of function with ADLs and functional mobility tasks.   OBJECTIVE IMPAIRMENTS: .   Abnormal gait, cardiopulmonary status limiting activity, decreased activity tolerance, decreased endurance, decreased mobility, difficulty walking, decreased ROM, decreased strength, hypomobility, increased fascial restrictions, impaired perceived functional ability, increased muscle spasms, impaired flexibility, impaired sensation, and pain.  ACTIVITY LIMITATIONS:  carrying, lifting, bending, sitting, standing, squatting, sleeping, stairs, transfers, bed mobility, continence, bathing, locomotion level, and caring for others  PARTICIPATION LIMITATIONS:  carrying, lifting, bending, sitting, standing, squatting, sleeping, stairs, transfers, bed mobility, continence, bathing, locomotion level, and caring for others  PERSONAL FACTORS: Time since onset of injury/illness/exacerbation are also affecting patient's functional outcome.   REHAB POTENTIAL: Fair    CLINICAL DECISION MAKING: Evolving/moderate complexity  EVALUATION COMPLEXITY: Moderate   GOALS: Goals reviewed with patient? No  SHORT TERM GOALS: Target date: 04/03/23 Pt to be I in HEP in order to decrease her pain to no greater than a 7/10 Baseline: Goal  status: IN PROGRESS  2.  Pt to be able to stand for 15 minutes without increased pain  Baseline:  Goal status: IN PROGRESS  3.  Pt to be able to walk for 15 minutes without increased pain  Baseline:  Goal status: IN PROGRESS    LONG TERM GOALS: Target date: 04/24/23  Pt to be I in an advanced HEP in order to decrease her pain to no greater than a 4/10 Baseline:  Goal status: IN PROGRESS  2.   Pt to be able to stand for 25 minutes without increased pain to make a meal  Baseline:  Goal status: IN PROGRESS  3.  Pt to be able to walk for 30 minutes without increased pain to allow pt to grocery shop in comfort.  Baseline:  Goal status: IN PROGRESS  4.  PT mm strength to have increased 1 grade to be able to come from sit to stand with ease.  Baseline:  Goal status: IN PROGRESS     PLAN:  PT FREQUENCY: 2x/week Virgina Organ, PT CLT [phone]  [phone]

## 2023-03-26 ENCOUNTER — Ambulatory Visit (HOSPITAL_COMMUNITY): Payer: Medicare Other | Admitting: Physical Therapy

## 2023-03-26 DIAGNOSIS — R29898 Other symptoms and signs involving the musculoskeletal system: Secondary | ICD-10-CM

## 2023-03-26 DIAGNOSIS — M545 Low back pain, unspecified: Secondary | ICD-10-CM

## 2023-03-26 DIAGNOSIS — M6281 Muscle weakness (generalized): Secondary | ICD-10-CM | POA: Diagnosis not present

## 2023-03-26 NOTE — Therapy (Signed)
OUTPATIENT PHYSICAL THERAPY TREATMENT   Patient Name: Maria Fry MRN: [id number] DOB:May 10, 1951, 72 y.o., female Today's Date: 03/26/2023  END OF SESSION:   PT End of Session - 03/26/23 1113     Visit Number 4    Number of Visits 12    Date for PT Re-Evaluation 04/24/23    Authorization Type [employer] medicare    Authorization - Number of Visits 12    Progress Note Due on Visit 10    PT Start Time 1115    PT Stop Time 1155    PT Time Calculation (min) 40 min    Activity Tolerance Patient tolerated treatment well                Past Medical History:  Diagnosis Date   Arthritis    Right knee   Atrial fibrillation (HCC)    CAD (coronary artery disease)    a. 06/07/15 NSTEMI/Cath: Dist LAD 100%, mid LAD 10%. Otw nl cors. Nl EF w/ inferoapical AK.   Essential hypertension    Hip pain    History of cardiomyopathy 04/23/2016   a. 04/2016 Echo: EF 45%, Gr2 DD; b. 11/2019 Echo: EF 45-50%, mod LVH. Gr2 DD. Nl RV fxn. Sev dil LA.    Hyperlipidemia    Hypothyroidism    NSTEMI (non-ST elevated myocardial infarction) (HCC) 06/05/2015   Vitamin D deficiency    Past Surgical History:  Procedure Laterality Date   ABDOMINAL HYSTERECTOMY     partial- one ovary remaining   BIOPSY  09/17/2020   Procedure: BIOPSY;  Surgeon: Lanelle Bal, DO;  Location: [facility];  Service: Endoscopy;;   CARDIAC CATHETERIZATION N/A 06/06/2015   Procedure: Left Heart Cath and Coronary Angiography;  Surgeon: Peter M Swaziland, MD;  Location: [facility] INVASIVE CV LAB;  Service: Cardiovascular;  Laterality: N/A;   CARDIOVERSION N/A 09/14/2020   Procedure: CARDIOVERSION;  Surgeon: Jonelle Sidle, MD;  Location: [facility];  Service: Cardiovascular;  Laterality: N/A;   CARDIOVERSION N/A 06/20/2022   Procedure: CARDIOVERSION;  Surgeon: Antoine Poche, MD;  Location: [facility];  Service: Endoscopy;  Laterality: N/A;   CARDIOVERSION N/A 01/01/2023   Procedure: CARDIOVERSION;  Surgeon: Jodelle Red,  MD;  Location: [facility] ENDOSCOPY;  Service: Cardiovascular;  Laterality: N/A;   CHOLECYSTECTOMY     COLONOSCOPY     COLONOSCOPY N/A 08/13/2017   Procedure: COLONOSCOPY;  Surgeon: Corbin Ade, MD;  Location: [facility];  Service: Endoscopy;  Laterality: N/A;  8:30 AM   COLONOSCOPY WITH PROPOFOL N/A 01/18/2021   [name]: Multiple tubular adenomas removed, next colonoscopy in 3 years   ESOPHAGOGASTRODUODENOSCOPY N/A 09/11/2016   Procedure: ESOPHAGOGASTRODUODENOSCOPY (EGD);  Surgeon: Corbin Ade, MD;  Location: [facility];  Service: Endoscopy;  Laterality: N/A;  7:45 am - moved to 10/11 @ 10:30   ESOPHAGOGASTRODUODENOSCOPY (EGD) WITH PROPOFOL N/A 09/17/2020   [name]: Diffuse moderate inflammation characterized by erosions and erythema found in the entire stomach.  Biopsies positive for H. pylori.  Patient has not been treated due to drug allergies.   Heel spurs Left    MALONEY DILATION N/A 09/11/2016   Procedure: [name] DILATION;  Surgeon: Corbin Ade, MD;  Location: [facility];  Service: Endoscopy;  Laterality: N/A;   POLYPECTOMY  08/13/2017   Procedure: POLYPECTOMY;  Surgeon: Corbin Ade, MD;  Location: [facility];  Service: Endoscopy;;  colon   POLYPECTOMY  01/18/2021   Procedure: POLYPECTOMY;  Surgeon: Corbin Ade, MD;  Location:  [facility];  Service: Endoscopy;;   SHOULDER ARTHROSCOPY Right    TUBAL LIGATION     Patient Active Problem List   Diagnosis Date Noted   Meralgia paresthetica 11/20/2022   Lumbar back pain 09/25/2022   Encounter for general adult medical examination with abnormal findings 09/25/2022   Refractory angina 07/01/2022   Elevated brain natriuretic peptide (BNP) level 06/19/2022   Insomnia 06/19/2022   Chest pain 06/18/2022   Hypomagnesemia 09/05/2021   Wheezing 03/06/2021   Sleep apnea 02/06/2021   Vitamin D insufficiency 01/30/2021   Encounter to establish care 01/30/2021   Helicobacter pylori gastritis 11/01/2020   H/O adenomatous  polyp of colon 10/31/2020   Constipation 10/15/2020   History of GI bleed 10/15/2020   Persistent atrial fibrillation 10/10/2020   Hypercoagulable state due to persistent atrial fibrillation 10/05/2020   Obesity, Class III, BMI 40-49.9 (morbid obesity)    Gastroesophageal reflux disease    Gastritis    Acquired hypothyroidism    Melena    CHF exacerbation 09/15/2020   Atrial fibrillation, chronic 08/23/2020   Stable angina 01/14/2020   Abnormal nuclear stress test 12/08/2019   Iron deficiency anemia 11/28/2019   Mixed hyperlipidemia 11/27/2019   OA (osteoarthritis) of knee 05/18/2019   DDD (degenerative disc disease), lumbar 05/18/2019   Obesity 12/26/2017   Seasonal allergies 08/19/2016   Glucose intolerance (impaired glucose tolerance) 05/14/2016   Chronic hip pain 05/14/2016   Chronic combined systolic and diastolic heart failure 05/14/2016   Hypothyroidism following radioiodine therapy 11/30/2015   CAD (coronary artery disease)    NSTEMI (non-ST elevated myocardial infarction) (HCC)    Essential hypertension 06/05/2015    PCP: Christel Mormon  REFERRING PROVIDER: Suanne Marker, MD  REFERRING DIAG: M54.42,M54.41,G89.29 (ICD-10-CM) - Chronic bilateral low back pain with bilateral sciatica M48.062 (ICD-10-CM) - Spinal stenosis of lumbar region with neurogenic claudication  THERAPY DIAG:  Muscle weakness (generalized)  Low back pain, unspecified back pain laterality, unspecified chronicity, unspecified whether sciatica present  Other symptoms and signs involving the musculoskeletal system  Rationale for Evaluation and Treatment: Rehabilitation  ONSET DATE: 08/17/2023 acute exacerbation    SUBJECTIVE STATEMENT: PT reports she is doing well overall today and doing her HEP.  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 Are you having pain? Yes: 0 Pain location: pain in low  back and left side Pain description: aching, sore Aggravating factors: mornings, prolonged stand or walk Relieving factors: sit in recliner and don't move PRECAUTIONS: None  WEIGHT BEARING RESTRICTIONS: No  FALLS:  Has patient fallen in last 6 months? No  LIVING ENVIRONMENT: Lives with: lives with their family Lives in: Mobile home Stairs: Yes: External: 5 steps; can reach both Has following equipment at home: Single point cane and Wheelchair (manual)  OCCUPATION: retired   PLOF: Independent  PATIENT GOALS: less pain and to be able to stand and walk longer.  NEXT MD VISIT: not scheduled   OBJECTIVE:   DIAGNOSTIC FINDINGS:  Narrative & Impression  CLINICAL DATA:  Chronic bilateral low back pain with bilateral sciatica     COMPARISON:  No prior MRI of the lumbar spine is available, an MRI IMPRESSION: 1. L4-L5 mild bilateral neural foraminal narrowing. Narrowing of the lateral recesses at this level could affect the descending L5 nerve roots. 2. Narrowing of the right lateral recess at L2-L3, which could affect the descending right L3 nerve roots. 3. Multilevel facet  arthropathy, which is moderate at L1-L2 and L2-L3 bilaterally and on the right at L4-L5 and L5-S1, which can be a cause of back pain. In addition, medially directed facet synovial cysts are noted on the right at L1-L2 and L2-L3, indenting on the posterior aspect of the thecal sac, but not causing significant spinal canal stenosis.     Electronically Signed   By: Wiliam Ke M.D.   On: 02/14/2023 13:31      PATIENT SURVEYS:  FOTO 48  COGNITION: Overall cognitive status: Within functional limits for tasks assessed    MUSCLE LENGTH: Hamstrings: Right 140 deg; Left 130 deg   POSTURE:  rounded shoulders, forward head, decreased lumbar lordosis, and increased thoracic kyphosis    Trunk ROM: extension: 12 degrees with reps increasing pain                      Flexion:  fingers just below knees  with increased pain.     LOWER EXTREMITY MMT:  MMT Right eval Left eval  Hip flexion 5 2+  Hip extension 2 2  Hip abduction 5 3  Hip adduction    Hip internal rotation    Hip external rotation    Knee flexion 3+ 3+  Knee extension 5 4  Ankle dorsiflexion 4 4  Ankle plantarflexion    Ankle inversion    Ankle eversion     (Blank rows = not tested) FUNCTIONAL TESTS: 03/13/23 30 seconds chair stand test: 7; 8 is poor for pt age and sex 2 minute walk test: 294 with significant increased pain.     TODAY'S TREATMENT:                                                                                                                              DATE:  03/26/23:    Standing:  wall arch 10X  Functional squat 10X  Hip excursions 5X each Supine: Knee to chest 3 x 30" B with towel  Hamstring stretch with towel 3X30" bil Bridge 10X 2 sets SLR 10X each Sidelying hip abduction 10X each 2 sets Rt LE Clams 10X2 with Lt LE  Prone:  heelsqueezes 10X5" 2 sets  Knee flexion 10X each (min ROM to 30 degrees)  Hip extensions 10X Nustep 5 minutes level 3 UE/LE seat 6  03/24/23 Wall arch x 10 Functional squat x 10  Hip excursion x 2 Sitting : tall posture  hold for 5 " x t Side lying: Clam x 10 B  Hip abduction x 10  Supine:  Knee to chest x 3 for 30" Double knee to chest 3 x 30" Hamstring stretch 3 x 30"  Bridge x 15  Bent knee lift x 10  Prone:   Heel squeeze x 10 Single leg raise x 10   03/17/23:    Supine: Knee to chest 3 x 30" B with towel Double knee to chest  (unable to complete today) Bridge  10X SLR 10X each Hamstring stretch with towel 2X30" each with towel Sidelying hip abduction 10X each Prone:  heelsqueezes 10X5"  Knee flexion 10X each  Hip extensions 10X  03/12/23:    Knee to chest 3 x 30" B Double knee to chest 3 x 30"   PATIENT EDUCATION:  Education details: HPE Person educated: Patient Education method: Explanation Education comprehension: returned  demonstration  HOME EXERCISE PROGRAM: Access Code: [email or web address] URL: [email or web address]  Date: 03/24/23  Supine Bridge  - 1 x daily - 7 x weekly - 1 sets - 10 reps - 3-5" hold - Sidelying Hip Abduction  - 1 x daily - 7 x weekly - 1 sets - 10 reps - 3-5" hold - Clamshell  - 1 x daily - 7 x weekly - 1 sets - 10 reps - 3-5" hold - Heel Raises with Counter Support  - 1 x daily - 7 x weekly - 1 sets - 10 reps - 3-5 hold - Mini Squat  - 1 x daily - 7 x weekly - 1 sets - 10 reps - 3-5 hold - Seated Transversus Abdominis Bracing  - 10 x daily - 7 x weekly - 1 sets - 10 reps - 3-5" hold  Date: 03/13/2023 Prepared by: Virgina Organ Exercises - Supine Single Knee to Chest Stretch  - 2 x daily - 7 x weekly - 3 sets - 3 reps - 30" hold - Supine Double Knee to Chest  - 2 x daily - 7 x weekly - 3 sets - 3 reps - 30" hold  ASSESSMENT:  CLINICAL IMPRESSION:    Continued to progress LE and core strengthening.   Increased to 2 sets of some exercises with rest between.  Cues for keeping knee fully extended with hamstring stretch.  Difficulty with Lt hip abduction having to complete clam only on this side due to discomfort and trouble with form.  Some cramping initially with bridge due to weakness and unable to bend further than 30 degrees with prone knee flexion.  Noted weakness with increased recovery time but overall good form and ability to keep count of repetitions.   Finished session with nustep to work on activity tolerance.  Pt will continue to benefit from skilled therapy to address deficits, reduce pain and improve level of function with ADLs and functional mobility tasks.   OBJECTIVE IMPAIRMENTS: .   Abnormal gait, cardiopulmonary status limiting activity, decreased activity tolerance, decreased endurance, decreased mobility, difficulty walking, decreased ROM, decreased strength, hypomobility, increased fascial restrictions, impaired perceived functional ability, increased muscle  spasms, impaired flexibility, impaired sensation, and pain.   ACTIVITY LIMITATIONS:  carrying, lifting, bending, sitting, standing, squatting, sleeping, stairs, transfers, bed mobility, continence, bathing, locomotion level, and caring for others   PARTICIPATION LIMITATIONS:  carrying, lifting, bending, sitting, standing, squatting, sleeping, stairs, transfers, bed mobility, continence, bathing, locomotion level, and caring for others   PERSONAL FACTORS: Time since onset of injury/illness/exacerbation are also affecting patient's functional outcome.   REHAB POTENTIAL: Fair    CLINICAL DECISION MAKING: Evolving/moderate complexity  EVALUATION COMPLEXITY: Moderate   GOALS: Goals reviewed with patient? No  SHORT TERM GOALS: Target date: 04/03/23 Pt to be I in HEP in order to decrease her pain to no greater than a 7/10 Baseline: Goal status: IN PROGRESS  2.  Pt to be able to stand for 15 minutes without increased pain  Baseline:  Goal status: IN PROGRESS  3.  Pt to be able to walk for 15 minutes without increased pain  Baseline:  Goal status: IN PROGRESS    LONG TERM GOALS: Target date: 04/24/23  Pt to be I in an advanced HEP in order to decrease her pain to no greater than a 4/10 Baseline:  Goal status: IN PROGRESS  2.   Pt to be able to stand for 25 minutes without increased pain to make a meal  Baseline:  Goal status: IN PROGRESS  3.  Pt to be able to walk for 30 minutes without increased pain to allow pt to grocery shop in comfort.  Baseline:  Goal status: IN PROGRESS  4.  PT mm strength to have increased 1 grade to be able to come from sit to stand with ease.  Baseline:  Goal status: IN PROGRESS     PLAN:  PT FREQUENCY: 2x/week  PT DURATION: 6 weeks   PLANNED INTERVENTIONS: Therapeutic exercises, Therapeutic activity, Neuromuscular re-education, Balance training, Gait training, Patient/Family education, Self Care, and Joint mobilization   PLAN FOR NEXT  SESSION:Progress lumbar stabilization program.  Update HEP weekly or PRN.   Lurena Nida, PTA/CLT [facility] Ph: [phone]   Lurena Nida, PTA 03/26/2023, 11:14 AM

## 2023-03-31 ENCOUNTER — Other Ambulatory Visit (HOSPITAL_BASED_OUTPATIENT_CLINIC_OR_DEPARTMENT_OTHER): Payer: Self-pay

## 2023-04-01 ENCOUNTER — Ambulatory Visit (HOSPITAL_COMMUNITY): Payer: Medicare Other | Admitting: Physical Therapy

## 2023-04-01 DIAGNOSIS — M545 Low back pain, unspecified: Secondary | ICD-10-CM

## 2023-04-01 DIAGNOSIS — M6281 Muscle weakness (generalized): Secondary | ICD-10-CM

## 2023-04-01 DIAGNOSIS — R29898 Other symptoms and signs involving the musculoskeletal system: Secondary | ICD-10-CM

## 2023-04-01 NOTE — Therapy (Signed)
OUTPATIENT PHYSICAL THERAPY TREATMENT   Patient Name: Maria Fry MRN: [id number] DOB:26-Jan-1951, 72 y.o., female Today's Date: 04/01/2023  END OF SESSION:   PT End of Session - 04/01/23 1437     Visit Number 5    Number of Visits 12    Date for PT Re-Evaluation 04/24/23    Authorization Type [employer] medicare    Authorization - Visit Number 4    Authorization - Number of Visits 12    Progress Note Due on Visit 10    PT Start Time 1348    PT Stop Time 1430    PT Time Calculation (min) 42 min    Activity Tolerance Patient tolerated treatment well                 Past Medical History:  Diagnosis Date   Arthritis    Right knee   Atrial fibrillation (HCC)    CAD (coronary artery disease)    a. 06/07/15 NSTEMI/Cath: Dist LAD 100%, mid LAD 10%. Otw nl cors. Nl EF w/ inferoapical AK.   Essential hypertension    Hip pain    History of cardiomyopathy 04/23/2016   a. 04/2016 Echo: EF 45%, Gr2 DD; b. 11/2019 Echo: EF 45-50%, mod LVH. Gr2 DD. Nl RV fxn. Sev dil LA.    Hyperlipidemia    Hypothyroidism    NSTEMI (non-ST elevated myocardial infarction) (HCC) 06/05/2015   Vitamin D deficiency    Past Surgical History:  Procedure Laterality Date   ABDOMINAL HYSTERECTOMY     partial- one ovary remaining   BIOPSY  09/17/2020   Procedure: BIOPSY;  Surgeon: Lanelle Bal, DO;  Location: [facility];  Service: Endoscopy;;   CARDIAC CATHETERIZATION N/A 06/06/2015   Procedure: Left Heart Cath and Coronary Angiography;  Surgeon: Peter M Swaziland, MD;  Location: [facility] INVASIVE CV LAB;  Service: Cardiovascular;  Laterality: N/A;   CARDIOVERSION N/A 09/14/2020   Procedure: CARDIOVERSION;  Surgeon: Jonelle Sidle, MD;  Location: [facility];  Service: Cardiovascular;  Laterality: N/A;   CARDIOVERSION N/A 06/20/2022   Procedure: CARDIOVERSION;  Surgeon: Antoine Poche, MD;  Location: [facility];  Service: Endoscopy;  Laterality: N/A;   CARDIOVERSION N/A 01/01/2023   Procedure:  CARDIOVERSION;  Surgeon: Jodelle Red, MD;  Location: [facility] ENDOSCOPY;  Service: Cardiovascular;  Laterality: N/A;   CHOLECYSTECTOMY     COLONOSCOPY     COLONOSCOPY N/A 08/13/2017   Procedure: COLONOSCOPY;  Surgeon: Corbin Ade, MD;  Location: [facility];  Service: Endoscopy;  Laterality: N/A;  8:30 AM   COLONOSCOPY WITH PROPOFOL N/A 01/18/2021   [name]: Multiple tubular adenomas removed, next colonoscopy in 3 years   ESOPHAGOGASTRODUODENOSCOPY N/A 09/11/2016   Procedure: ESOPHAGOGASTRODUODENOSCOPY (EGD);  Surgeon: Corbin Ade, MD;  Location: [facility];  Service: Endoscopy;  Laterality: N/A;  7:45 am - moved to 10/11 @ 10:30   ESOPHAGOGASTRODUODENOSCOPY (EGD) WITH PROPOFOL N/A 09/17/2020   [name]: Diffuse moderate inflammation characterized by erosions and erythema found in the entire stomach.  Biopsies positive for H. pylori.  Patient has not been treated due to drug allergies.   Heel spurs Left    MALONEY DILATION N/A 09/11/2016   Procedure: [name] DILATION;  Surgeon: Corbin Ade, MD;  Location: [facility];  Service: Endoscopy;  Laterality: N/A;   POLYPECTOMY  08/13/2017   Procedure: POLYPECTOMY;  Surgeon: Corbin Ade, MD;  Location: [facility];  Service: Endoscopy;;  colon   POLYPECTOMY  01/18/2021   Procedure:  POLYPECTOMY;  Surgeon: Corbin Ade, MD;  Location: [facility];  Service: Endoscopy;;   SHOULDER ARTHROSCOPY Right    TUBAL LIGATION     Patient Active Problem List   Diagnosis Date Noted   Meralgia paresthetica 11/20/2022   Lumbar back pain 09/25/2022   Encounter for general adult medical examination with abnormal findings 09/25/2022   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HCC) 07/01/2022   Elevated brain natriuretic peptide (BNP) level 06/19/2022   Insomnia 06/19/2022   Chest pain 06/18/2022   Hypomagnesemia 09/05/2021   Wheezing 03/06/2021   Sleep apnea 02/06/2021   Vitamin D insufficiency 01/30/2021   Encounter to establish care 01/30/2021    Helicobacter pylori gastritis 11/01/2020   H/O adenomatous polyp of colon 10/31/2020   Constipation 10/15/2020   History of GI bleed 10/15/2020   Persistent atrial fibrillation (HCC) 10/10/2020   Hypercoagulable state due to persistent atrial fibrillation (HCC) 10/05/2020   Obesity, Class III, BMI 40-49.9 (morbid obesity) (HCC)    Gastroesophageal reflux disease    Gastritis    Acquired hypothyroidism    Melena    CHF exacerbation (HCC) 09/15/2020   Atrial fibrillation, chronic (HCC) 08/23/2020   Stable angina 01/14/2020   Abnormal nuclear stress test 12/08/2019   Iron deficiency anemia 11/28/2019   Mixed hyperlipidemia 11/27/2019   OA (osteoarthritis) of knee 05/18/2019   DDD (degenerative disc disease), lumbar 05/18/2019   Obesity 12/26/2017   Seasonal allergies 08/19/2016   Glucose intolerance (impaired glucose tolerance) 05/14/2016   Chronic hip pain 05/14/2016   Chronic combined systolic and diastolic heart failure (HCC) 05/14/2016   Hypothyroidism following radioiodine therapy 11/30/2015   CAD (coronary artery disease)    NSTEMI (non-ST elevated myocardial infarction) (HCC)    Essential hypertension 06/05/2015    PCP: Christel Mormon  REFERRING PROVIDER: Suanne Marker, MD  REFERRING DIAG: M54.42,M54.41,G89.29 (ICD-10-CM) - Chronic bilateral low back pain with bilateral sciatica M48.062 (ICD-10-CM) - Spinal stenosis of lumbar region with neurogenic claudication  THERAPY DIAG:  Muscle weakness (generalized)  Low back pain, unspecified back pain laterality, unspecified chronicity, unspecified whether sciatica present  Other symptoms and signs involving the musculoskeletal system  Rationale for Evaluation and Treatment: Rehabilitation  ONSET DATE: 08/17/2023 acute exacerbation    SUBJECTIVE STATEMENT: Pt states currently without pain.  States some of the exercises are too hard and uncomfortable to do.    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 Are you having pain? Yes: 0 Pain location: pain in low back and left side Pain description: aching, sore Aggravating factors: mornings, prolonged stand or walk Relieving factors: sit in recliner and don't move PRECAUTIONS: None  WEIGHT BEARING RESTRICTIONS: No  FALLS:  Has patient fallen in last 6 months? No  LIVING ENVIRONMENT: Lives with: lives with their family Lives in: Mobile home Stairs: Yes: External: 5 steps; can reach both Has following equipment at home: Single point cane and Wheelchair (manual)  OCCUPATION: retired   PLOF: Independent  PATIENT GOALS: less pain and to be able to stand and walk longer.  NEXT MD VISIT: not scheduled   OBJECTIVE:   DIAGNOSTIC FINDINGS:  Narrative & Impression  CLINICAL DATA:  Chronic bilateral low back pain with bilateral sciatica     COMPARISON:  No prior MRI of the lumbar spine is available, an MRI IMPRESSION: 1. L4-L5 mild bilateral neural foraminal narrowing. Narrowing of the lateral recesses at this level could affect the descending  L5 nerve roots. 2. Narrowing of the right lateral recess at L2-L3, which could affect the descending right L3 nerve roots. 3. Multilevel facet arthropathy, which is moderate at L1-L2 and L2-L3 bilaterally and on the right at L4-L5 and L5-S1, which can be a cause of back pain. In addition, medially directed facet synovial cysts are noted on the right at L1-L2 and L2-L3, indenting on the posterior aspect of the thecal sac, but not causing significant spinal canal stenosis.     Electronically Signed   By: Wiliam Ke M.D.   On: 02/14/2023 13:31      PATIENT SURVEYS:  FOTO 48  COGNITION: Overall cognitive status: Within functional limits for tasks assessed    MUSCLE LENGTH: Hamstrings: Right 140 deg; Left 130 deg   POSTURE:  rounded shoulders, forward head, decreased lumbar lordosis, and increased  thoracic kyphosis    Trunk ROM: extension: 12 degrees with reps increasing pain                      Flexion:  fingers just below knees with increased pain.     LOWER EXTREMITY MMT:  MMT Right eval Left eval  Hip flexion 5 2+  Hip extension 2 2  Hip abduction 5 3  Hip adduction    Hip internal rotation    Hip external rotation    Knee flexion 3+ 3+  Knee extension 5 4  Ankle dorsiflexion 4 4  Ankle plantarflexion    Ankle inversion    Ankle eversion     (Blank rows = not tested) FUNCTIONAL TESTS: 03/13/23 30 seconds chair stand test: 7; 8 is poor for pt age and sex 2 minute walk test: 294 with significant increased pain.     TODAY'S TREATMENT:                                                                                                                              DATE:  04/01/23:    Nustep 5 minutes level 3 UE/LE seat 8 Standing:  wall arch 10X  Functional squat 10X  Hip excursions 5X each  Hip extensions 2X10 each at counter  Knee flexion 2X10 each LE Seated:  long sitting hamstring stretch 3X30" each Supine: abdominal isometrics 10X5" Knee to chest 3 x 30" B with towel  Bridge 10X 2 sets SLR 10X each Sidelying hip abduction 10X each 2 sets bil LE   03/26/23:    Standing:  wall arch 10X  Functional squat 10X  Hip excursions 5X each Supine: Knee to chest 3 x 30" B with towel  Hamstring stretch with towel 3X30" bil Bridge 10X 2 sets SLR 10X each Sidelying hip abduction 10X each 2 sets Rt LE Clams 10X2 with Lt LE  Prone:  heelsqueezes 10X5" 2 sets  Knee flexion 10X each (min ROM to 30 degrees)  Hip extensions 10X Nustep 5 minutes level 3 UE/LE seat 6  03/24/23 Wall arch x  10 Functional squat x 10  Hip excursion x 2 Sitting : tall posture  hold for 5 " x t Side lying: Clam x 10 B  Hip abduction x 10  Supine:  Knee to chest x 3 for 30" Double knee to chest 3 x 30" Hamstring stretch 3 x 30"  Bridge x 15  Bent knee lift x 10  Prone:   Heel  squeeze x 10 Single leg raise x 10   03/17/23:    Supine: Knee to chest 3 x 30" B with towel Double knee to chest  (unable to complete today) Bridge 10X SLR 10X each Hamstring stretch with towel 2X30" each with towel Sidelying hip abduction 10X each Prone:  heelsqueezes 10X5"  Knee flexion 10X each  Hip extensions 10X  03/12/23:    Knee to chest 3 x 30" B Double knee to chest 3 x 30"   PATIENT EDUCATION:  Education details: HPE Person educated: Patient Education method: Explanation Education comprehension: returned demonstration  HOME EXERCISE PROGRAM: Access Code: [email or web address] URL: [email or web address]  Date: 03/24/23  Supine Bridge  - 1 x daily - 7 x weekly - 1 sets - 10 reps - 3-5" hold - Sidelying Hip Abduction  - 1 x daily - 7 x weekly - 1 sets - 10 reps - 3-5" hold - Clamshell  - 1 x daily - 7 x weekly - 1 sets - 10 reps - 3-5" hold - Heel Raises with Counter Support  - 1 x daily - 7 x weekly - 1 sets - 10 reps - 3-5 hold - Mini Squat  - 1 x daily - 7 x weekly - 1 sets - 10 reps - 3-5 hold - Seated Transversus Abdominis Bracing  - 10 x daily - 7 x weekly - 1 sets - 10 reps - 3-5" hold  Date: 03/13/2023 Prepared by: Virgina Organ Exercises - Supine Single Knee to Chest Stretch  - 2 x daily - 7 x weekly - 3 sets - 3 reps - 30" hold - Supine Double Knee to Chest  - 2 x daily - 7 x weekly - 3 sets - 3 reps - 30" hold  ASSESSMENT:  CLINICAL IMPRESSION:    Began with nustep for warm up with improved tolerance today without needing rest break.  Discussed HEP and reports exercises in prone are painful.  Changed to standing exercises with good form and much improved tolerance.  Also noted supine hamstring stretch was challenging with difficulty getting a good stretch due to UE weakness/soft tissue interruption. Instructed in long sitting with better stretch obtained.  Pt reported all these changes were much better and remained symptom free throughout session.  Pt  was able to complete hip abduction on LT today as was unable to do this last visit. Also, no hamstring cramping today with bridge.  Pt will continue to benefit from skilled therapy to address deficits, reduce pain and improve level of function with ADLs and functional mobility tasks.   OBJECTIVE IMPAIRMENTS: .   Abnormal gait, cardiopulmonary status limiting activity, decreased activity tolerance, decreased endurance, decreased mobility, difficulty walking, decreased ROM, decreased strength, hypomobility, increased fascial restrictions, impaired perceived functional ability, increased muscle spasms, impaired flexibility, impaired sensation, and pain.   ACTIVITY LIMITATIONS:  carrying, lifting, bending, sitting, standing, squatting, sleeping, stairs, transfers, bed mobility, continence, bathing, locomotion level, and caring for others   PARTICIPATION LIMITATIONS:  carrying, lifting, bending, sitting, standing, squatting, sleeping, stairs, transfers, bed mobility, continence, bathing, locomotion level, and caring for  others   PERSONAL FACTORS: Time since onset of injury/illness/exacerbation are also affecting patient's functional outcome.   REHAB POTENTIAL: Fair    CLINICAL DECISION MAKING: Evolving/moderate complexity  EVALUATION COMPLEXITY: Moderate   GOALS: Goals reviewed with patient? No  SHORT TERM GOALS: Target date: 04/03/23 Pt to be I in HEP in order to decrease her pain to no greater than a 7/10 Baseline: Goal status: IN PROGRESS  2.  Pt to be able to stand for 15 minutes without increased pain  Baseline:  Goal status: IN PROGRESS  3.  Pt to be able to walk for 15 minutes without increased pain  Baseline:  Goal status: IN PROGRESS    LONG TERM GOALS: Target date: 04/24/23  Pt to be I in an advanced HEP in order to decrease her pain to no greater than a 4/10 Baseline:  Goal status: IN PROGRESS  2.   Pt to be able to stand for 25 minutes without increased pain to make a  meal  Baseline:  Goal status: IN PROGRESS  3.  Pt to be able to walk for 30 minutes without increased pain to allow pt to grocery shop in comfort.  Baseline:  Goal status: IN PROGRESS  4.  PT mm strength to have increased 1 grade to be able to come from sit to stand with ease.  Baseline:  Goal status: IN PROGRESS     PLAN:  PT FREQUENCY: 2x/week  PT DURATION: 6 weeks   PLANNED INTERVENTIONS: Therapeutic exercises, Therapeutic activity, Neuromuscular re-education, Balance training, Gait training, Patient/Family education, Self Care, and Joint mobilization   PLAN FOR NEXT SESSION:Progress lumbar stabilization program.  Update HEP weekly or PRN.   Lurena Nida, PTA/CLT [facility] [place] Ph: [phone]   Lurena Nida, PTA 04/01/2023, 2:38 PM

## 2023-04-02 ENCOUNTER — Ambulatory Visit (HOSPITAL_COMMUNITY): Payer: Medicare Other | Attending: Internal Medicine | Admitting: Physical Therapy

## 2023-04-02 DIAGNOSIS — M545 Low back pain, unspecified: Secondary | ICD-10-CM | POA: Diagnosis present

## 2023-04-02 DIAGNOSIS — R29898 Other symptoms and signs involving the musculoskeletal system: Secondary | ICD-10-CM

## 2023-04-02 DIAGNOSIS — M6281 Muscle weakness (generalized): Secondary | ICD-10-CM

## 2023-04-02 NOTE — Therapy (Signed)
OUTPATIENT PHYSICAL THERAPY TREATMENT   Patient Name: Maria Fry MRN: [id number] DOB:1951-03-01, 72 y.o., female Today's Date: 04/02/2023  END OF SESSION:   PT End of Session - 04/02/23 1403     Visit Number 6    Number of Visits 12    Date for PT Re-Evaluation 04/24/23    Authorization Type [employer] medicare    Authorization - Number of Visits 12    Progress Note Due on Visit 10    PT Start Time 1350    PT Stop Time 1430    PT Time Calculation (min) 40 min    Activity Tolerance Patient tolerated treatment well                  Past Medical History:  Diagnosis Date   Arthritis    Right knee   Atrial fibrillation (HCC)    CAD (coronary artery disease)    a. 06/07/15 NSTEMI/Cath: Dist LAD 100%, mid LAD 10%. Otw nl cors. Nl EF w/ inferoapical AK.   Essential hypertension    Hip pain    History of cardiomyopathy 04/23/2016   a. 04/2016 Echo: EF 45%, Gr2 DD; b. 11/2019 Echo: EF 45-50%, mod LVH. Gr2 DD. Nl RV fxn. Sev dil LA.    Hyperlipidemia    Hypothyroidism    NSTEMI (non-ST elevated myocardial infarction) (HCC) 06/05/2015   Vitamin D deficiency    Past Surgical History:  Procedure Laterality Date   ABDOMINAL HYSTERECTOMY     partial- one ovary remaining   BIOPSY  09/17/2020   Procedure: BIOPSY;  Surgeon: Lanelle Bal, DO;  Location: [facility];  Service: Endoscopy;;   CARDIAC CATHETERIZATION N/A 06/06/2015   Procedure: Left Heart Cath and Coronary Angiography;  Surgeon: Peter M Swaziland, MD;  Location: [facility];  Service: Cardiovascular;  Laterality: N/A;   CARDIOVERSION N/A 09/14/2020   Procedure: CARDIOVERSION;  Surgeon: Jonelle Sidle, MD;  Location: [facility];  Service: Cardiovascular;  Laterality: N/A;   CARDIOVERSION N/A 06/20/2022   Procedure: CARDIOVERSION;  Surgeon: Antoine Poche, MD;  Location: [facility];  Service: Endoscopy;  Laterality: N/A;   CARDIOVERSION N/A 01/01/2023   Procedure: CARDIOVERSION;  Surgeon: Jodelle Red, MD;  Location: [facility] ENDOSCOPY;  Service: Cardiovascular;  Laterality: N/A;   CHOLECYSTECTOMY     COLONOSCOPY     COLONOSCOPY N/A 08/13/2017   Procedure: COLONOSCOPY;  Surgeon: Corbin Ade, MD;  Location: [facility];  Service: Endoscopy;  Laterality: N/A;  8:30 AM   COLONOSCOPY WITH PROPOFOL N/A 01/18/2021   [name]: Multiple tubular adenomas removed, next colonoscopy in 3 years   ESOPHAGOGASTRODUODENOSCOPY N/A 09/11/2016   Procedure: ESOPHAGOGASTRODUODENOSCOPY (EGD);  Surgeon: Corbin Ade, MD;  Location: [facility];  Service: Endoscopy;  Laterality: N/A;  7:45 am - moved to 10/11 @ 10:30   ESOPHAGOGASTRODUODENOSCOPY (EGD) WITH PROPOFOL N/A 09/17/2020   [name]: Diffuse moderate inflammation characterized by erosions and erythema found in the entire stomach.  Biopsies positive for H. pylori.  Patient has not been treated due to drug allergies.   Heel spurs Left    MALONEY DILATION N/A 09/11/2016   Procedure: [name] DILATION;  Surgeon: Corbin Ade, MD;  Location: [facility];  Service: Endoscopy;  Laterality: N/A;   POLYPECTOMY  08/13/2017   Procedure: POLYPECTOMY;  Surgeon: Corbin Ade, MD;  Location: [facility];  Service: Endoscopy;;  colon   POLYPECTOMY  01/18/2021   Procedure: POLYPECTOMY;  Surgeon: Corbin Ade, MD;  Location: [facility];  Service: Endoscopy;;   SHOULDER ARTHROSCOPY Right    TUBAL LIGATION     Patient Active Problem List   Diagnosis Date Noted   Meralgia paresthetica 11/20/2022   Lumbar back pain 09/25/2022   Encounter for general adult medical examination with abnormal findings 09/25/2022   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HCC) 07/01/2022   Elevated brain natriuretic peptide (BNP) level 06/19/2022   Insomnia 06/19/2022   Chest pain 06/18/2022   Hypomagnesemia 09/05/2021   Wheezing 03/06/2021   Sleep apnea 02/06/2021   Vitamin D insufficiency 01/30/2021   Encounter to establish care 01/30/2021   Helicobacter pylori gastritis 11/01/2020   H/O  adenomatous polyp of colon 10/31/2020   Constipation 10/15/2020   History of GI bleed 10/15/2020   Persistent atrial fibrillation (HCC) 10/10/2020   Hypercoagulable state due to persistent atrial fibrillation (HCC) 10/05/2020   Obesity, Class III, BMI 40-49.9 (morbid obesity) (HCC)    Gastroesophageal reflux disease    Gastritis    Acquired hypothyroidism    Melena    CHF exacerbation (HCC) 09/15/2020   Atrial fibrillation, chronic (HCC) 08/23/2020   Stable angina 01/14/2020   Abnormal nuclear stress test 12/08/2019   Iron deficiency anemia 11/28/2019   Mixed hyperlipidemia 11/27/2019   OA (osteoarthritis) of knee 05/18/2019   DDD (degenerative disc disease), lumbar 05/18/2019   Obesity 12/26/2017   Seasonal allergies 08/19/2016   Glucose intolerance (impaired glucose tolerance) 05/14/2016   Chronic hip pain 05/14/2016   Chronic combined systolic and diastolic heart failure (HCC) 05/14/2016   Hypothyroidism following radioiodine therapy 11/30/2015   CAD (coronary artery disease)    NSTEMI (non-ST elevated myocardial infarction) (HCC)    Essential hypertension 06/05/2015    PCP: Christel Mormon  REFERRING PROVIDER: Suanne Marker, MD  REFERRING DIAG: M54.42,M54.41,G89.29 (ICD-10-CM) - Chronic bilateral low back pain with bilateral sciatica M48.062 (ICD-10-CM) - Spinal stenosis of lumbar region with neurogenic claudication  THERAPY DIAG:  Muscle weakness (generalized)  Low back pain, unspecified back pain laterality, unspecified chronicity, unspecified whether sciatica present  Other symptoms and signs involving the musculoskeletal system  Rationale for Evaluation and Treatment: Rehabilitation  ONSET DATE: 08/17/2023 acute exacerbation    SUBJECTIVE STATEMENT: Pt states that she has no pain.  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 Are you having pain? Yes: 0 Pain location: pain in low back and left side Pain description: aching, sore Aggravating factors: mornings, prolonged stand or walk Relieving factors: sit in recliner and don't move PRECAUTIONS: None  WEIGHT BEARING RESTRICTIONS: No  FALLS:  Has patient fallen in last 6 months? No  LIVING ENVIRONMENT: Lives with: lives with their family Lives in: Mobile home Stairs: Yes: External: 5 steps; can reach both Has following equipment at home: Single point cane and Wheelchair (manual)  OCCUPATION: retired   PLOF: Independent  PATIENT GOALS: less pain and to be able to stand and walk longer.  NEXT MD VISIT: not scheduled   OBJECTIVE:   DIAGNOSTIC FINDINGS:  Narrative & Impression  CLINICAL DATA:  Chronic bilateral low back pain with bilateral sciatica     COMPARISON:  No prior MRI of the lumbar spine is available, an MRI IMPRESSION: 1. L4-L5 mild bilateral neural foraminal narrowing. Narrowing of the lateral recesses at this level could affect the descending L5 nerve roots. 2. Narrowing of the right lateral recess  at L2-L3, which could affect the descending right L3 nerve roots. 3. Multilevel facet arthropathy, which is moderate at L1-L2 and L2-L3 bilaterally and on the right at L4-L5 and L5-S1, which can be a cause of back pain. In addition, medially directed facet synovial cysts are noted on the right at L1-L2 and L2-L3, indenting on the posterior aspect of the thecal sac, but not causing significant spinal canal stenosis.     Electronically Signed   By: Wiliam Ke M.D.   On: 02/14/2023 13:31      PATIENT SURVEYS:  FOTO 48  COGNITION: Overall cognitive status: Within functional limits for tasks assessed    MUSCLE LENGTH: Hamstrings: Right 140 deg; Left 130 deg   POSTURE:  rounded shoulders, forward head, decreased lumbar lordosis, and increased thoracic kyphosis    Trunk ROM: extension: 12 degrees with reps increasing  pain                      Flexion:  fingers just below knees with increased pain.     LOWER EXTREMITY MMT:  MMT Right eval Left eval  Hip flexion 5 2+  Hip extension 2 2  Hip abduction 5 3  Hip adduction    Hip internal rotation    Hip external rotation    Knee flexion 3+ 3+  Knee extension 5 4  Ankle dorsiflexion 4 4  Ankle plantarflexion    Ankle inversion    Ankle eversion     (Blank rows = not tested) FUNCTIONAL TESTS: 03/13/23 30 seconds chair stand test: 7; 8 is poor for pt age and sex 2 minute walk test: 294 with significant increased pain.     TODAY'S TREATMENT:                                                                                                                              DATE:  04/02/2023 Standing : Blue theraband: Scapular retraction x 10 Rows x 10 Shoulder extension x 10  Functional squat x 10  Marching x 10 Sitting:  Sitting tall x 10  Sit to stand x 10  Glut set with ab set combination x 10 Supine: Trunk rotation x 10  Pelvic tilt x 10 Bridge x 10  Knee to chest x 3 for 30" each with towel 04/01/23:    Nustep 5 minutes level 3 UE/LE seat 8 Standing:  wall arch 10X  Functional squat 10X  Hip excursions 5X each  Hip extensions 2X10 each at counter  Knee flexion 2X10 each LE Seated:  long sitting hamstring stretch 3X30" each Supine: abdominal isometrics 10X5" Knee to chest 3 x 30" B with towel  Bridge 10X 2 sets SLR 10X each Sidelying hip abduction 10X each 2 sets bil LE   03/26/23:    Standing:  wall arch 10X  Functional squat 10X  Hip excursions 5X each Supine: Knee to chest 3 x 30" B with towel  Hamstring  stretch with towel 3X30" bil Bridge 10X 2 sets SLR 10X each Sidelying hip abduction 10X each 2 sets Rt LE Clams 10X2 with Lt LE  Prone:  heelsqueezes 10X5" 2 sets  Knee flexion 10X each (min ROM to 30 degrees)  Hip extensions 10X Nustep 5 minutes level 3 UE/LE seat 6  03/24/23 Wall arch x 10 Functional squat x 10   Hip excursion x 2 Sitting : tall posture  hold for 5 " x t Side lying: Clam x 10 B  Hip abduction x 10  Supine:  Knee to chest x 3 for 30" Double knee to chest 3 x 30" Hamstring stretch 3 x 30"  Bridge x 15  Bent knee lift x 10  Prone:   Heel squeeze x 10 Single leg raise x 10   03/17/23:    Supine: Knee to chest 3 x 30" B with towel Double knee to chest  (unable to complete today) Bridge 10X SLR 10X each Hamstring stretch with towel 2X30" each with towel Sidelying hip abduction 10X each Prone:  heelsqueezes 10X5"  Knee flexion 10X each  Hip extensions 10X  03/12/23:    Knee to chest 3 x 30" B Double knee to chest 3 x 30"   PATIENT EDUCATION:  Education details: HPE Person educated: Patient Education method: Explanation Education comprehension: returned demonstration  HOME EXERCISE PROGRAM: Access Code: [email or web address] URL: [email or web address]  Date: 03/24/23  Supine Bridge  - 1 x daily - 7 x weekly - 1 sets - 10 reps - 3-5" hold - Sidelying Hip Abduction  - 1 x daily - 7 x weekly - 1 sets - 10 reps - 3-5" hold - Clamshell  - 1 x daily - 7 x weekly - 1 sets - 10 reps - 3-5" hold - Heel Raises with Counter Support  - 1 x daily - 7 x weekly - 1 sets - 10 reps - 3-5 hold - Mini Squat  - 1 x daily - 7 x weekly - 1 sets - 10 reps - 3-5 hold - Seated Transversus Abdominis Bracing  - 10 x daily - 7 x weekly - 1 sets - 10 reps - 3-5" hold  Date: 03/13/2023 Prepared by: Virgina Organ Exercises - Supine Single Knee to Chest Stretch  - 2 x daily - 7 x weekly - 3 sets - 3 reps - 30" hold - Supine Double Knee to Chest  - 2 x daily - 7 x weekly - 3 sets - 3 reps - 30" hold  ASSESSMENT:  CLINICAL IMPRESSION:   Added postural thera-band exercise for back strengthening.  Encouraged pt to begin a walking program.  Pt continues to have increased pain after being active for about 10 minutes.  Pt will continue to benefit from skilled therapy to improve her strength and  activity tolerance.     OBJECTIVE IMPAIRMENTS: .   Abnormal gait, cardiopulmonary status limiting activity, decreased activity tolerance, decreased endurance, decreased mobility, difficulty walking, decreased ROM, decreased strength, hypomobility, increased fascial restrictions, impaired perceived functional ability, increased muscle spasms, impaired flexibility, impaired sensation, and pain.   ACTIVITY LIMITATIONS:  carrying, lifting, bending, sitting, standing, squatting, sleeping, stairs, transfers, bed mobility, continence, bathing, locomotion level, and caring for others   PARTICIPATION LIMITATIONS:  carrying, lifting, bending, sitting, standing, squatting, sleeping, stairs, transfers, bed mobility, continence, bathing, locomotion level, and caring for others   PERSONAL FACTORS: Time since onset of injury/illness/exacerbation are also affecting patient's functional outcome.   REHAB POTENTIAL: Fair  CLINICAL DECISION MAKING: Evolving/moderate complexity  EVALUATION COMPLEXITY: Moderate   GOALS: Goals reviewed with patient? No  SHORT TERM GOALS: Target date: 04/03/23 Pt to be I in HEP in order to decrease her pain to no greater than a 7/10 Baseline: Goal status: MET  2.  Pt to be able to stand for 15 minutes without increased pain  Baseline:  Goal status: MET  3.  Pt to be able to walk for 15 minutes without increased pain  Baseline:  Goal status: IN PROGRESS    LONG TERM GOALS: Target date: 04/24/23  Pt to be I in an advanced HEP in order to decrease her pain to no greater than a 4/10 Baseline:  Goal status: IN PROGRESS  2.   Pt to be able to stand for 25 minutes without increased pain to make a meal  Baseline:  Goal status: IN PROGRESS  3.  Pt to be able to walk for 30 minutes without increased pain to allow pt to grocery shop in comfort.  Baseline:  Goal status: IN PROGRESS  4.  PT mm strength to have increased 1 grade to be able to come from sit to stand  with ease.  Baseline:  Goal status: IN PROGRESS     PLAN:  PT FREQUENCY: 2x/week  PT DURATION: 6 weeks   PLANNED INTERVENTIONS: Therapeutic exercises, Therapeutic activity, Neuromuscular re-education, Balance training, Gait training, Patient/Family education, Self Care, and Joint mobilization   PLAN FOR NEXT SESSION:Progress lumbar stabilization program.  Update HEP weekly or PRN.  Virgina Organ, PT CLT [phone]  04/02/2023, 2:34 PM

## 2023-04-07 ENCOUNTER — Other Ambulatory Visit: Payer: Self-pay

## 2023-04-07 ENCOUNTER — Ambulatory Visit (HOSPITAL_COMMUNITY): Payer: Medicare Other | Admitting: Physical Therapy

## 2023-04-07 ENCOUNTER — Other Ambulatory Visit: Payer: Self-pay | Admitting: *Deleted

## 2023-04-07 DIAGNOSIS — M545 Low back pain, unspecified: Secondary | ICD-10-CM

## 2023-04-07 DIAGNOSIS — M6281 Muscle weakness (generalized): Secondary | ICD-10-CM

## 2023-04-07 DIAGNOSIS — R29898 Other symptoms and signs involving the musculoskeletal system: Secondary | ICD-10-CM

## 2023-04-07 MED ORDER — NITROGLYCERIN 0.4 MG SL SUBL: SUBLINGUAL_TABLET | SUBLINGUAL | 3 refills | Status: DC

## 2023-04-07 MED ORDER — GABAPENTIN 300 MG PO CAPS: 300.0000 mg | ORAL_CAPSULE | Freq: Two times a day (BID) | ORAL | 2 refills | Status: DC

## 2023-04-07 NOTE — Therapy (Signed)
OUTPATIENT PHYSICAL THERAPY TREATMENT   Patient Name: Maria Fry MRN: [id number] DOB:1951/02/14, 72 y.o., female Today's Date: 04/07/2023  END OF SESSION:   PT End of Session - 04/07/23 1430    Visit Number 7    Number of Visits 12    Date for PT Re-Evaluation 04/24/23    Authorization Type [employer] medicare    Authorization - Visit Number --    Authorization - Number of Visits 12    Progress Note Due on Visit 10    PT Start Time 1351    PT Stop Time 1430    PT Time Calculation (min) 39 min    Activity Tolerance Patient tolerated treatment well                  Past Medical History:  Diagnosis Date   Arthritis    Right knee   Atrial fibrillation (HCC)    CAD (coronary artery disease)    a. 06/07/15 NSTEMI/Cath: Dist LAD 100%, mid LAD 10%. Otw nl cors. Nl EF w/ inferoapical AK.   Essential hypertension    Hip pain    History of cardiomyopathy 04/23/2016   a. 04/2016 Echo: EF 45%, Gr2 DD; b. 11/2019 Echo: EF 45-50%, mod LVH. Gr2 DD. Nl RV fxn. Sev dil LA.    Hyperlipidemia    Hypothyroidism    NSTEMI (non-ST elevated myocardial infarction) (HCC) 06/05/2015   Vitamin D deficiency    Past Surgical History:  Procedure Laterality Date   ABDOMINAL HYSTERECTOMY     partial- one ovary remaining   BIOPSY  09/17/2020   Procedure: BIOPSY;  Surgeon: Lanelle Bal, DO;  Location: [facility];  Service: Endoscopy;;   CARDIAC CATHETERIZATION N/A 06/06/2015   Procedure: Left Heart Cath and Coronary Angiography;  Surgeon: Peter M Swaziland, MD;  Location: [facility] INVASIVE CV LAB;  Service: Cardiovascular;  Laterality: N/A;   CARDIOVERSION N/A 09/14/2020   Procedure: CARDIOVERSION;  Surgeon: Jonelle Sidle, MD;  Location: [facility];  Service: Cardiovascular;  Laterality: N/A;   CARDIOVERSION N/A 06/20/2022   Procedure: CARDIOVERSION;  Surgeon: Antoine Poche, MD;  Location: [facility];  Service: Endoscopy;  Laterality: N/A;   CARDIOVERSION N/A 01/01/2023   Procedure:  CARDIOVERSION;  Surgeon: Jodelle Red, MD;  Location: [facility] ENDOSCOPY;  Service: Cardiovascular;  Laterality: N/A;   CHOLECYSTECTOMY     COLONOSCOPY     COLONOSCOPY N/A 08/13/2017   Procedure: COLONOSCOPY;  Surgeon: Corbin Ade, MD;  Location: [facility];  Service: Endoscopy;  Laterality: N/A;  8:30 AM   COLONOSCOPY WITH PROPOFOL N/A 01/18/2021   [name]: Multiple tubular adenomas removed, next colonoscopy in 3 years   ESOPHAGOGASTRODUODENOSCOPY N/A 09/11/2016   Procedure: ESOPHAGOGASTRODUODENOSCOPY (EGD);  Surgeon: Corbin Ade, MD;  Location: [facility];  Service: Endoscopy;  Laterality: N/A;  7:45 am - moved to 10/11 @ 10:30   ESOPHAGOGASTRODUODENOSCOPY (EGD) WITH PROPOFOL N/A 09/17/2020   [name]: Diffuse moderate inflammation characterized by erosions and erythema found in the entire stomach.  Biopsies positive for H. pylori.  Patient has not been treated due to drug allergies.   Heel spurs Left    MALONEY DILATION N/A 09/11/2016   Procedure: [name] DILATION;  Surgeon: Corbin Ade, MD;  Location: [facility];  Service: Endoscopy;  Laterality: N/A;   POLYPECTOMY  08/13/2017   Procedure: POLYPECTOMY;  Surgeon: Corbin Ade, MD;  Location: [facility];  Service: Endoscopy;;  colon   POLYPECTOMY  01/18/2021   Procedure:  POLYPECTOMY;  Surgeon: Corbin Ade, MD;  Location: [facility];  Service: Endoscopy;;   SHOULDER ARTHROSCOPY Right    TUBAL LIGATION     Patient Active Problem List   Diagnosis Date Noted   Meralgia paresthetica 11/20/2022   Lumbar back pain 09/25/2022   Encounter for general adult medical examination with abnormal findings 09/25/2022   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HCC) 07/01/2022   Elevated brain natriuretic peptide (BNP) level 06/19/2022   Insomnia 06/19/2022   Chest pain 06/18/2022   Hypomagnesemia 09/05/2021   Wheezing 03/06/2021   Sleep apnea 02/06/2021   Vitamin D insufficiency 01/30/2021   Encounter to establish care 01/30/2021    Helicobacter pylori gastritis 11/01/2020   H/O adenomatous polyp of colon 10/31/2020   Constipation 10/15/2020   History of GI bleed 10/15/2020   Persistent atrial fibrillation (HCC) 10/10/2020   Hypercoagulable state due to persistent atrial fibrillation (HCC) 10/05/2020   Obesity, Class III, BMI 40-49.9 (morbid obesity) (HCC)    Gastroesophageal reflux disease    Gastritis    Acquired hypothyroidism    Melena    CHF exacerbation (HCC) 09/15/2020   Atrial fibrillation, chronic (HCC) 08/23/2020   Stable angina 01/14/2020   Abnormal nuclear stress test 12/08/2019   Iron deficiency anemia 11/28/2019   Mixed hyperlipidemia 11/27/2019   OA (osteoarthritis) of knee 05/18/2019   DDD (degenerative disc disease), lumbar 05/18/2019   Obesity 12/26/2017   Seasonal allergies 08/19/2016   Glucose intolerance (impaired glucose tolerance) 05/14/2016   Chronic hip pain 05/14/2016   Chronic combined systolic and diastolic heart failure (HCC) 05/14/2016   Hypothyroidism following radioiodine therapy 11/30/2015   CAD (coronary artery disease)    NSTEMI (non-ST elevated myocardial infarction) (HCC)    Essential hypertension 06/05/2015    PCP: Christel Mormon  REFERRING PROVIDER: Suanne Marker, MD  REFERRING DIAG: M54.42,M54.41,G89.29 (ICD-10-CM) - Chronic bilateral low back pain with bilateral sciatica M48.062 (ICD-10-CM) - Spinal stenosis of lumbar region with neurogenic claudication  THERAPY DIAG:  Muscle weakness (generalized)  Low back pain, unspecified back pain laterality, unspecified chronicity, unspecified whether sciatica present  Other symptoms and signs involving the musculoskeletal system  Rationale for Evaluation and Treatment: Rehabilitation  ONSET DATE: 08/17/2023 acute exacerbation    SUBJECTIVE STATEMENT: Pt states that she has no pain.  Pt has started walking to the mailbox and back.    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 Are you having pain? Yes: 0 Pain location: pain in low back and left side Pain description: aching, sore Aggravating factors: mornings, prolonged stand or walk Relieving factors: sit in recliner and don't move PRECAUTIONS: None  WEIGHT BEARING RESTRICTIONS: No  FALLS:  Has patient fallen in last 6 months? No  LIVING ENVIRONMENT: Lives with: lives with their family Lives in: Mobile home Stairs: Yes: External: 5 steps; can reach both Has following equipment at home: Single point cane and Wheelchair (manual)  OCCUPATION: retired   PLOF: Independent  PATIENT GOALS: less pain and to be able to stand and walk longer.  NEXT MD VISIT: not scheduled   OBJECTIVE:   DIAGNOSTIC FINDINGS:  Narrative & Impression  CLINICAL DATA:  Chronic bilateral low back pain with bilateral sciatica     COMPARISON:  No prior MRI of the lumbar spine is available, an MRI IMPRESSION: 1. L4-L5 mild bilateral neural foraminal narrowing. Narrowing of the lateral recesses at this level could affect the descending L5  nerve roots. 2. Narrowing of the right lateral recess at L2-L3, which could affect the descending right L3 nerve roots. 3. Multilevel facet arthropathy, which is moderate at L1-L2 and L2-L3 bilaterally and on the right at L4-L5 and L5-S1, which can be a cause of back pain. In addition, medially directed facet synovial cysts are noted on the right at L1-L2 and L2-L3, indenting on the posterior aspect of the thecal sac, but not causing significant spinal canal stenosis.     Electronically Signed   By: Wiliam Ke M.D.   On: 02/14/2023 13:31      PATIENT SURVEYS:  FOTO 48  COGNITION: Overall cognitive status: Within functional limits for tasks assessed    MUSCLE LENGTH: Hamstrings: Right 140 deg; Left 130 deg   POSTURE:  rounded shoulders, forward head, decreased lumbar lordosis, and increased thoracic kyphosis     Trunk ROM: extension: 12 degrees with reps increasing pain                      Flexion:  fingers just below knees with increased pain.     LOWER EXTREMITY MMT:  MMT Right eval Left eval  Hip flexion 5 2+  Hip extension 2 2  Hip abduction 5 3  Hip adduction    Hip internal rotation    Hip external rotation    Knee flexion 3+ 3+  Knee extension 5 4  Ankle dorsiflexion 4 4  Ankle plantarflexion    Ankle inversion    Ankle eversion     (Blank rows = not tested) FUNCTIONAL TESTS: 03/13/23 30 seconds chair stand test: 7; 8 is poor for pt age and sex 2 minute walk test: 294 with significant increased pain.     TODAY'S TREATMENT:                                                                                                                              DATE:  04/07/23 Theraband: blue Scapular retraction x15 Rows x15 Extension  x 15 Pallof x 10 Squat with L of arms to Rt then Lt equals 1 x 5 Marching x 15  Wall plank x 30" x 2  Sitting  Thoracic extension x 15  Sitting tall with glut and abdominal set x 10  Sit to stand x 10  Standing excursions x 3  Leg press 4 Pls x 10  Supine: Bridge x 10  Knee to chest x 3 Pelvic tilt x 10 LTR x 5  04/02/2023 Standing : Blue theraband: Scapular retraction x 10 Rows x 10 Shoulder extension x 10  Functional squat x 10  Marching x 10 Sitting:  Sitting tall x 10  Sit to stand x 10  Glut set with ab set combination x 10 Supine: Trunk rotation x 10  Pelvic tilt x 10 Bridge x 10  Knee to chest x 3 for 30" each with towel 04/01/23:    Nustep 5 minutes level  3 UE/LE seat 8 Standing:  wall arch 10X  Functional squat 10X  Hip excursions 5X each  Hip extensions 2X10 each at counter  Knee flexion 2X10 each LE Seated:  long sitting hamstring stretch 3X30" each Supine: abdominal isometrics 10X5" Knee to chest 3 x 30" B with towel  Bridge 10X 2 sets SLR 10X each Sidelying hip abduction 10X each 2 sets bil LE   03/26/23:     Standing:  wall arch 10X  Functional squat 10X  Hip excursions 5X each Supine: Knee to chest 3 x 30" B with towel  Hamstring stretch with towel 3X30" bil Bridge 10X 2 sets SLR 10X each Sidelying hip abduction 10X each 2 sets Rt LE Clams 10X2 with Lt LE  Prone:  heelsqueezes 10X5" 2 sets  Knee flexion 10X each (min ROM to 30 degrees)  Hip extensions 10X Nustep 5 minutes level 3 UE/LE seat 6    PATIENT EDUCATION:  Education details: HPE Person educated: Patient Education method: Explanation Education comprehension: returned demonstration  HOME EXERCISE PROGRAM: Access Code: [email or web address] URL: [email or web address]  Date: 03/24/23  Supine Bridge  - 1 x daily - 7 x weekly - 1 sets - 10 reps - 3-5" hold - Sidelying Hip Abduction  - 1 x daily - 7 x weekly - 1 sets - 10 reps - 3-5" hold - Clamshell  - 1 x daily - 7 x weekly - 1 sets - 10 reps - 3-5" hold - Heel Raises with Counter Support  - 1 x daily - 7 x weekly - 1 sets - 10 reps - 3-5 hold - Mini Squat  - 1 x daily - 7 x weekly - 1 sets - 10 reps - 3-5 hold - Seated Transversus Abdominis Bracing  - 10 x daily - 7 x weekly - 1 sets - 10 reps - 3-5" hold  Date: 03/13/2023 Prepared by: Virgina Organ Exercises - Supine Single Knee to Chest Stretch  - 2 x daily - 7 x weekly - 3 sets - 3 reps - 30" hold - Supine Double Knee to Chest  - 2 x daily - 7 x weekly - 3 sets - 3 reps - 30" hold  ASSESSMENT:  CLINICAL IMPRESSION:   Added pallof for back strengthening and sitting excursion for mobility today .    Pt continues to have increased pain after being active for about 10 minutes.  Pt will continue to benefit from skilled therapy to improve her strength and activity tolerance.     OBJECTIVE IMPAIRMENTS: .   Abnormal gait, cardiopulmonary status limiting activity, decreased activity tolerance, decreased endurance, decreased mobility, difficulty walking, decreased ROM, decreased strength, hypomobility, increased fascial  restrictions, impaired perceived functional ability, increased muscle spasms, impaired flexibility, impaired sensation, and pain.   ACTIVITY LIMITATIONS:  carrying, lifting, bending, sitting, standing, squatting, sleeping, stairs, transfers, bed mobility, continence, bathing, locomotion level, and caring for others   PARTICIPATION LIMITATIONS:  carrying, lifting, bending, sitting, standing, squatting, sleeping, stairs, transfers, bed mobility, continence, bathing, locomotion level, and caring for others   PERSONAL FACTORS: Time since onset of injury/illness/exacerbation are also affecting patient's functional outcome.   REHAB POTENTIAL: Fair    CLINICAL DECISION MAKING: Evolving/moderate complexity  EVALUATION COMPLEXITY: Moderate   GOALS: Goals reviewed with patient? No  SHORT TERM GOALS: Target date: 04/03/23 Pt to be I in HEP in order to decrease her pain to no greater than a 7/10 Baseline: Goal status: MET  2.  Pt to be able to stand for  15 minutes without increased pain  Baseline:  Goal status: MET  3.  Pt to be able to walk for 15 minutes without increased pain  Baseline:  Goal status: MET    LONG TERM GOALS: Target date: 04/24/23  Pt to be I in an advanced HEP in order to decrease her pain to no greater than a 4/10 Baseline:  Goal status: MET  2.   Pt to be able to stand for 25 minutes without increased pain to make a meal  Baseline:  Goal status: MET  3.  Pt to be able to walk for 30 minutes without increased pain to allow pt to grocery shop in comfort.  Baseline:  Goal status: IN PROGRESS  4.  PT mm strength to have increased 1 grade to be able to come from sit to stand with ease.  Baseline:  Goal status: IN PROGRESS     PLAN:  PT FREQUENCY: 2x/week  PT DURATION: 6 weeks   PLANNED INTERVENTIONS: Therapeutic exercises, Therapeutic activity, Neuromuscular re-education, Balance training, Gait training, Patient/Family education, Self Care, and Joint  mobilization   PLAN FOR NEXT SESSION:Progress lumbar stabilization program.  Update HEP weekly or PRN.  Virgina Organ, PT CLT [phone]  04/07/2023, 2:34 PM

## 2023-04-09 ENCOUNTER — Ambulatory Visit (HOSPITAL_COMMUNITY): Payer: Medicare Other | Admitting: Physical Therapy

## 2023-04-09 DIAGNOSIS — M6281 Muscle weakness (generalized): Secondary | ICD-10-CM

## 2023-04-09 DIAGNOSIS — R29898 Other symptoms and signs involving the musculoskeletal system: Secondary | ICD-10-CM

## 2023-04-09 NOTE — Therapy (Signed)
OUTPATIENT PHYSICAL THERAPY TREATMENT   Patient Name: Maria Fry MRN: [id number] DOB:09-16-1951, 72 y.o., female Today's Date: 04/09/2023  END OF SESSION:   PT End of Session - 04/09/23 1200    Visit Number 8    Number of Visits 12    Date for PT Re-Evaluation 04/24/23    Authorization Type [employer] medicare    Authorization - Visit Number 8    Authorization - Number of Visits 12    Progress Note Due on Visit 10    PT Start Time 1120    PT Stop Time 1200    PT Time Calculation (min) 40 min    Activity Tolerance Patient limited by fatigue    Behavior During Therapy [facility] for tasks assessed/performed               Past Medical History:  Diagnosis Date   Arthritis    Right knee   Atrial fibrillation (HCC)    CAD (coronary artery disease)    a. 06/07/15 NSTEMI/Cath: Dist LAD 100%, mid LAD 10%. Otw nl cors. Nl EF w/ inferoapical AK.   Essential hypertension    Hip pain    History of cardiomyopathy 04/23/2016   a. 04/2016 Echo: EF 45%, Gr2 DD; b. 11/2019 Echo: EF 45-50%, mod LVH. Gr2 DD. Nl RV fxn. Sev dil LA.    Hyperlipidemia    Hypothyroidism    NSTEMI (non-ST elevated myocardial infarction) (HCC) 06/05/2015   Vitamin D deficiency    Past Surgical History:  Procedure Laterality Date   ABDOMINAL HYSTERECTOMY     partial- one ovary remaining   BIOPSY  09/17/2020   Procedure: BIOPSY;  Surgeon: Lanelle Bal, DO;  Location: [facility];  Service: Endoscopy;;   CARDIAC CATHETERIZATION N/A 06/06/2015   Procedure: Left Heart Cath and Coronary Angiography;  Surgeon: Peter M Swaziland, MD;  Location: [facility] INVASIVE CV LAB;  Service: Cardiovascular;  Laterality: N/A;   CARDIOVERSION N/A 09/14/2020   Procedure: CARDIOVERSION;  Surgeon: Jonelle Sidle, MD;  Location: [facility];  Service: Cardiovascular;  Laterality: N/A;   CARDIOVERSION N/A 06/20/2022   Procedure: CARDIOVERSION;  Surgeon: Antoine Poche, MD;  Location: [facility] OR[facility];  Service: Endoscopy;  Laterality: N/A;    CARDIOVERSION N/A 01/01/2023   Procedure: CARDIOVERSION;  Surgeon: Jodelle Red, MD;  Location: [facility] ENDOSCOPY;  Service: Cardiovascular;  Laterality: N/A;   CHOLECYSTECTOMY     COLONOSCOPY     COLONOSCOPY N/A 08/13/2017   Procedure: COLONOSCOPY;  Surgeon: Corbin Ade, MD;  Location: [facility];  Service: Endoscopy;  Laterality: N/A;  8:30 AM   COLONOSCOPY WITH PROPOFOL N/A 01/18/2021   [name]: Multiple tubular adenomas removed, next colonoscopy in 3 years   ESOPHAGOGASTRODUODENOSCOPY N/A 09/11/2016   Procedure: ESOPHAGOGASTRODUODENOSCOPY (EGD);  Surgeon: Corbin Ade, MD;  Location: [facility];  Service: Endoscopy;  Laterality: N/A;  7:45 am - moved to 10/11 @ 10:30   ESOPHAGOGASTRODUODENOSCOPY (EGD) WITH PROPOFOL N/A 09/17/2020   [name]: Diffuse moderate inflammation characterized by erosions and erythema found in the entire stomach.  Biopsies positive for H. pylori.  Patient has not been treated due to drug allergies.   Heel spurs Left    MALONEY DILATION N/A 09/11/2016   Procedure: [name] DILATION;  Surgeon: Corbin Ade, MD;  Location: [facility];  Service: Endoscopy;  Laterality: N/A;   POLYPECTOMY  08/13/2017   Procedure: POLYPECTOMY;  Surgeon: Corbin Ade, MD;  Location: [facility];  Service: Endoscopy;;  colon  POLYPECTOMY  01/18/2021   Procedure: POLYPECTOMY;  Surgeon: Corbin Ade, MD;  Location: [facility];  Service: Endoscopy;;   SHOULDER ARTHROSCOPY Right    TUBAL LIGATION     Patient Active Problem List   Diagnosis Date Noted   Meralgia paresthetica 11/20/2022   Lumbar back pain 09/25/2022   Encounter for general adult medical examination with abnormal findings 09/25/2022   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HCC) 07/01/2022   Elevated brain natriuretic peptide (BNP) level 06/19/2022   Insomnia 06/19/2022   Chest pain 06/18/2022   Hypomagnesemia 09/05/2021   Wheezing 03/06/2021   Sleep apnea 02/06/2021   Vitamin D insufficiency 01/30/2021   Encounter  to establish care 01/30/2021   Helicobacter pylori gastritis 11/01/2020   H/O adenomatous polyp of colon 10/31/2020   Constipation 10/15/2020   History of GI bleed 10/15/2020   Persistent atrial fibrillation (HCC) 10/10/2020   Hypercoagulable state due to persistent atrial fibrillation (HCC) 10/05/2020   Obesity, Class III, BMI 40-49.9 (morbid obesity) (HCC)    Gastroesophageal reflux disease    Gastritis    Acquired hypothyroidism    Melena    CHF exacerbation (HCC) 09/15/2020   Atrial fibrillation, chronic (HCC) 08/23/2020   Stable angina 01/14/2020   Abnormal nuclear stress test 12/08/2019   Iron deficiency anemia 11/28/2019   Mixed hyperlipidemia 11/27/2019   OA (osteoarthritis) of knee 05/18/2019   DDD (degenerative disc disease), lumbar 05/18/2019   Obesity 12/26/2017   Seasonal allergies 08/19/2016   Glucose intolerance (impaired glucose tolerance) 05/14/2016   Chronic hip pain 05/14/2016   Chronic combined systolic and diastolic heart failure (HCC) 05/14/2016   Hypothyroidism following radioiodine therapy 11/30/2015   CAD (coronary artery disease)    NSTEMI (non-ST elevated myocardial infarction) (HCC)    Essential hypertension 06/05/2015    PCP: Christel Mormon  REFERRING PROVIDER: Suanne Marker, MD  REFERRING DIAG: M54.42,M54.41,G89.29 (ICD-10-CM) - Chronic bilateral low back pain with bilateral sciatica M48.062 (ICD-10-CM) - Spinal stenosis of lumbar region with neurogenic claudication  THERAPY DIAG:  Muscle weakness (generalized)  Other symptoms and signs involving the musculoskeletal system  Rationale for Evaluation and Treatment: Rehabilitation  ONSET DATE: 08/17/2023 acute exacerbation    SUBJECTIVE STATEMENT: Pt states that she has no pain.  Pt states that she has joined [employer] to start exercising on her own.     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 Are you having pain? Yes: 0 Pain location: pain in low back and left side Pain description: aching, sore Aggravating factors: mornings, prolonged stand or walk Relieving factors: sit in recliner and don't move PRECAUTIONS: None  WEIGHT BEARING RESTRICTIONS: No  FALLS:  Has patient fallen in last 6 months? No  LIVING ENVIRONMENT: Lives with: lives with their family Lives in: Mobile home Stairs: Yes: External: 5 steps; can reach both Has following equipment at home: Single point cane and Wheelchair (manual)  OCCUPATION: retired   PLOF: Independent  PATIENT GOALS: less pain and to be able to stand and walk longer.  NEXT MD VISIT: not scheduled   OBJECTIVE:   DIAGNOSTIC FINDINGS:  Narrative & Impression  CLINICAL DATA:  Chronic bilateral low back pain with bilateral sciatica     COMPARISON:  No prior MRI of the lumbar spine is available, an MRI IMPRESSION: 1. L4-L5 mild bilateral neural foraminal narrowing. Narrowing of the lateral recesses at this level could affect the descending L5 nerve roots.  2. Narrowing of the right lateral recess at L2-L3, which could affect the descending right L3 nerve roots. 3. Multilevel facet arthropathy, which is moderate at L1-L2 and L2-L3 bilaterally and on the right at L4-L5 and L5-S1, which can be a cause of back pain. In addition, medially directed facet synovial cysts are noted on the right at L1-L2 and L2-L3, indenting on the posterior aspect of the thecal sac, but not causing significant spinal canal stenosis.     Electronically Signed   By: Wiliam Ke M.D.   On: 02/14/2023 13:31      PATIENT SURVEYS:  FOTO 48  COGNITION: Overall cognitive status: Within functional limits for tasks assessed    MUSCLE LENGTH: Hamstrings: Right 140 deg; Left 130 deg   POSTURE:  rounded shoulders, forward head, decreased lumbar lordosis, and increased thoracic kyphosis    Trunk ROM: extension: 12 degrees with reps increasing  pain                      Flexion:  fingers just below knees with increased pain.     LOWER EXTREMITY MMT:  MMT Right eval Left eval  Hip flexion 5 2+  Hip extension 2 2  Hip abduction 5 3  Hip adduction    Hip internal rotation    Hip external rotation    Knee flexion 3+ 3+  Knee extension 5 4  Ankle dorsiflexion 4 4  Ankle plantarflexion    Ankle inversion    Ankle eversion     (Blank rows = not tested) FUNCTIONAL TESTS: 03/13/23 30 seconds chair stand test: 7; 8 is poor for pt age and sex 2 minute walk test: 294 with significant increased pain.     TODAY'S TREATMENT:                                                                                                                              DATE:  04/09/23: [place] Green Scapular retraction x20-HEP Rows x15-HEP Extension  x 15-HEP Pallof x 10 Squat to heel raise at counter x 10  Side lying :  Clam x 10 B Hip abduction x 10  Supine Bridge x 15  Knee to chest x 3  04/07/23 Theraband: blue Scapular retraction x15 Rows x15 Extension  x 15 Pallof x 10 Squat with L of arms to Rt then Lt equals 1 x 5 Marching x 15  Wall plank x 30" x 2  Sitting  Thoracic extension x 15  Sitting tall with glut and abdominal set x 10  Sit to stand x 10  Standing excursions x 3  Leg press 4 Pls x 10  Supine: Bridge x 10  Knee to chest x 3 Pelvic tilt x 10 LTR x 5  04/02/2023 Standing : Blue theraband: Scapular retraction x 10 Rows x 10 Shoulder extension x 10  Functional squat x 10  Marching x 10 Sitting:  Sitting tall x 10  Sit to stand x 10  Glut set with ab set combination x 10 Supine: Trunk rotation x 10  Pelvic tilt x 10 Bridge x 10  Knee to chest x 3 for 30" each with towel 04/01/23:    Nustep 5 minutes level 3 UE/LE seat 8 Standing:  wall arch 10X  Functional squat 10X  Hip excursions 5X each  Hip extensions 2X10 each at counter  Knee flexion 2X10 each LE Seated:  long sitting hamstring stretch 3X30"  each Supine: abdominal isometrics 10X5" Knee to chest 3 x 30" B with towel  Bridge 10X 2 sets SLR 10X each Sidelying hip abduction 10X each 2 sets bil LE   03/26/23:    Standing:  wall arch 10X  Functional squat 10X  Hip excursions 5X each Supine: Knee to chest 3 x 30" B with towel  Hamstring stretch with towel 3X30" bil Bridge 10X 2 sets SLR 10X each Sidelying hip abduction 10X each 2 sets Rt LE Clams 10X2 with Lt LE  Prone:  heelsqueezes 10X5" 2 sets  Knee flexion 10X each (min ROM to 30 degrees)  Hip extensions 10X Nustep 5 minutes level 3 UE/LE seat 6    PATIENT EDUCATION:  Education details: HPE Person educated: Patient Education method: Explanation Education comprehension: returned demonstration  HOME EXERCISE PROGRAM: Access Code: [email or web address] URL: [email or web address] Date: 04/09/23 Postural exercises green for scapular retraction, row and extension  Date: 03/24/23  Supine Bridge  - 1 x daily - 7 x weekly - 1 sets - 10 reps - 3-5" hold - Sidelying Hip Abduction  - 1 x daily - 7 x weekly - 1 sets - 10 reps - 3-5" hold - Clamshell  - 1 x daily - 7 x weekly - 1 sets - 10 reps - 3-5" hold - Heel Raises with Counter Support  - 1 x daily - 7 x weekly - 1 sets - 10 reps - 3-5 hold - Mini Squat  - 1 x daily - 7 x weekly - 1 sets - 10 reps - 3-5 hold - Seated Transversus Abdominis Bracing  - 10 x daily - 7 x weekly - 1 sets - 10 reps - 3-5" hold  Date: 03/13/2023 Prepared by: Virgina Organ Exercises - Supine Single Knee to Chest Stretch  - 2 x daily - 7 x weekly - 3 sets - 3 reps - 30" hold - Supine Double Knee to Chest  - 2 x daily - 7 x weekly - 3 sets - 3 reps - 30" hold  ASSESSMENT:  CLINICAL IMPRESSION:   Pt without pain today.  Pt had to rest several times with cardiovascular fatigue.  Therapist checked O2 which was at 95 and Pulse which was at 79. Therapist gave pt thera-band and postural exercises for HEP Pt will continue to benefit from skilled  therapy to improve her strength and activity tolerance.     OBJECTIVE IMPAIRMENTS: .   Abnormal gait, cardiopulmonary status limiting activity, decreased activity tolerance, decreased endurance, decreased mobility, difficulty walking, decreased ROM, decreased strength, hypomobility, increased fascial restrictions, impaired perceived functional ability, increased muscle spasms, impaired flexibility, impaired sensation, and pain.   ACTIVITY LIMITATIONS:  carrying, lifting, bending, sitting, standing, squatting, sleeping, stairs, transfers, bed mobility, continence, bathing, locomotion level, and caring for others   PARTICIPATION LIMITATIONS:  carrying, lifting, bending, sitting, standing, squatting, sleeping, stairs, transfers, bed mobility, continence, bathing, locomotion level, and caring for others   PERSONAL FACTORS: Time since onset of injury/illness/exacerbation are also affecting patient's functional  outcome.   REHAB POTENTIAL: Fair    CLINICAL DECISION MAKING: Evolving/moderate complexity  EVALUATION COMPLEXITY: Moderate   GOALS: Goals reviewed with patient? No  SHORT TERM GOALS: Target date: 04/03/23 Pt to be I in HEP in order to decrease her pain to no greater than a 7/10 Baseline: Goal status: MET  2.  Pt to be able to stand for 15 minutes without increased pain  Baseline:  Goal status: MET  3.  Pt to be able to walk for 15 minutes without increased pain  Baseline:  Goal status: MET    LONG TERM GOALS: Target date: 04/24/23  Pt to be I in an advanced HEP in order to decrease her pain to no greater than a 4/10 Baseline:  Goal status: MET  2.   Pt to be able to stand for 25 minutes without increased pain to make a meal  Baseline:  Goal status: MET  3.  Pt to be able to walk for 30 minutes without increased pain to allow pt to grocery shop in comfort.  Baseline:  Goal status: IN PROGRESS  4.  PT mm strength to have increased 1 grade to be able to come from sit  to stand with ease.  Baseline:  Goal status: IN PROGRESS     PLAN:  PT FREQUENCY: 2x/week  PT DURATION: 6 weeks   PLANNED INTERVENTIONS: Therapeutic exercises, Therapeutic activity, Neuromuscular re-education, Balance training, Gait training, Patient/Family education, Self Care, and Joint mobilization   PLAN FOR NEXT SESSION:Progress lumbar stabilization program.  Update HEP weekly or PRN.  Virgina Organ, PT CLT [phone]  04/09/2023, 12:00 PM

## 2023-04-15 ENCOUNTER — Encounter (HOSPITAL_COMMUNITY): Payer: Medicare Other | Admitting: Physical Therapy

## 2023-04-17 ENCOUNTER — Ambulatory Visit (HOSPITAL_COMMUNITY): Payer: Medicare Other | Admitting: Physical Therapy

## 2023-04-17 DIAGNOSIS — M6281 Muscle weakness (generalized): Secondary | ICD-10-CM

## 2023-04-17 DIAGNOSIS — R29898 Other symptoms and signs involving the musculoskeletal system: Secondary | ICD-10-CM

## 2023-04-17 DIAGNOSIS — M545 Low back pain, unspecified: Secondary | ICD-10-CM

## 2023-04-17 NOTE — Therapy (Signed)
OUTPATIENT PHYSICAL THERAPY TREATMENT   Patient Name: Maria Fry MRN: [id number] DOB:August 13, 1951, 72 y.o., female Today's Date: 04/17/2023 Progress Note Reporting Period 03/13/23 to 05/17/23  See note below for Objective Data and Assessment of Progress/Goals.     END OF SESSION:   PT End of Session - 04/09/23 1200    Visit Number 8    Number of Visits 12    Date for PT Re-Evaluation 04/24/23    Authorization Type [employer] medicare    Authorization - Visit Number 8    Authorization - Number of Visits 12    Progress Note Due on Visit 10    PT Start Time 1120    PT Stop Time 1200    PT Time Calculation (min) 40 min    Activity Tolerance Patient limited by fatigue    Behavior During Therapy [facility] for tasks assessed/performed               Past Medical History:  Diagnosis Date   Arthritis    Right knee   Atrial fibrillation (HCC)    CAD (coronary artery disease)    a. 06/07/15 NSTEMI/Cath: Dist LAD 100%, mid LAD 10%. Otw nl cors. Nl EF w/ inferoapical AK.   Essential hypertension    Hip pain    History of cardiomyopathy 04/23/2016   a. 04/2016 Echo: EF 45%, Gr2 DD; b. 11/2019 Echo: EF 45-50%, mod LVH. Gr2 DD. Nl RV fxn. Sev dil LA.    Hyperlipidemia    Hypothyroidism    NSTEMI (non-ST elevated myocardial infarction) (HCC) 06/05/2015   Vitamin D deficiency    Past Surgical History:  Procedure Laterality Date   ABDOMINAL HYSTERECTOMY     partial- one ovary remaining   BIOPSY  09/17/2020   Procedure: BIOPSY;  Surgeon: Lanelle Bal, DO;  Location: [facility];  Service: Endoscopy;;   CARDIAC CATHETERIZATION N/A 06/06/2015   Procedure: Left Heart Cath and Coronary Angiography;  Surgeon: Peter M Swaziland, MD;  Location: [facility] INVASIVE CV LAB;  Service: Cardiovascular;  Laterality: N/A;   CARDIOVERSION N/A 09/14/2020   Procedure: CARDIOVERSION;  Surgeon: Jonelle Sidle, MD;  Location: [facility];  Service: Cardiovascular;  Laterality: N/A;   CARDIOVERSION N/A 06/20/2022    Procedure: CARDIOVERSION;  Surgeon: Antoine Poche, MD;  Location: [facility];  Service: Endoscopy;  Laterality: N/A;   CARDIOVERSION N/A 01/01/2023   Procedure: CARDIOVERSION;  Surgeon: Jodelle Red, MD;  Location: [facility] ENDOSCOPY;  Service: Cardiovascular;  Laterality: N/A;   CHOLECYSTECTOMY     COLONOSCOPY     COLONOSCOPY N/A 08/13/2017   Procedure: COLONOSCOPY;  Surgeon: Corbin Ade, MD;  Location: [facility];  Service: Endoscopy;  Laterality: N/A;  8:30 AM   COLONOSCOPY WITH PROPOFOL N/A 01/18/2021   [name]: Multiple tubular adenomas removed, next colonoscopy in 3 years   ESOPHAGOGASTRODUODENOSCOPY N/A 09/11/2016   Procedure: ESOPHAGOGASTRODUODENOSCOPY (EGD);  Surgeon: Corbin Ade, MD;  Location: [facility];  Service: Endoscopy;  Laterality: N/A;  7:45 am - moved to 10/11 @ 10:30   ESOPHAGOGASTRODUODENOSCOPY (EGD) WITH PROPOFOL N/A 09/17/2020   [name]: Diffuse moderate inflammation characterized by erosions and erythema found in the entire stomach.  Biopsies positive for H. pylori.  Patient has not been treated due to drug allergies.   Heel spurs Left    MALONEY DILATION N/A 09/11/2016   Procedure: [name] DILATION;  Surgeon: Corbin Ade, MD;  Location: [facility];  Service: Endoscopy;  Laterality: N/A;   POLYPECTOMY  08/13/2017  Procedure: POLYPECTOMY;  Surgeon: Corbin Ade, MD;  Location: [facility];  Service: Endoscopy;;  colon   POLYPECTOMY  01/18/2021   Procedure: POLYPECTOMY;  Surgeon: Corbin Ade, MD;  Location: [facility];  Service: Endoscopy;;   SHOULDER ARTHROSCOPY Right    TUBAL LIGATION     Patient Active Problem List   Diagnosis Date Noted   Meralgia paresthetica 11/20/2022   Lumbar back pain 09/25/2022   Encounter for general adult medical examination with abnormal findings 09/25/2022   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HCC) 07/01/2022   Elevated brain natriuretic peptide (BNP) level 06/19/2022   Insomnia 06/19/2022   Chest pain 06/18/2022    Hypomagnesemia 09/05/2021   Wheezing 03/06/2021   Sleep apnea 02/06/2021   Vitamin D insufficiency 01/30/2021   Encounter to establish care 01/30/2021   Helicobacter pylori gastritis 11/01/2020   H/O adenomatous polyp of colon 10/31/2020   Constipation 10/15/2020   History of GI bleed 10/15/2020   Persistent atrial fibrillation (HCC) 10/10/2020   Hypercoagulable state due to persistent atrial fibrillation (HCC) 10/05/2020   Obesity, Class III, BMI 40-49.9 (morbid obesity) (HCC)    Gastroesophageal reflux disease    Gastritis    Acquired hypothyroidism    Melena    CHF exacerbation (HCC) 09/15/2020   Atrial fibrillation, chronic (HCC) 08/23/2020   Stable angina 01/14/2020   Abnormal nuclear stress test 12/08/2019   Iron deficiency anemia 11/28/2019   Mixed hyperlipidemia 11/27/2019   OA (osteoarthritis) of knee 05/18/2019   DDD (degenerative disc disease), lumbar 05/18/2019   Obesity 12/26/2017   Seasonal allergies 08/19/2016   Glucose intolerance (impaired glucose tolerance) 05/14/2016   Chronic hip pain 05/14/2016   Chronic combined systolic and diastolic heart failure (HCC) 05/14/2016   Hypothyroidism following radioiodine therapy 11/30/2015   CAD (coronary artery disease)    NSTEMI (non-ST elevated myocardial infarction) (HCC)    Essential hypertension 06/05/2015    PCP: Christel Mormon  REFERRING PROVIDER: Suanne Marker, MD  REFERRING DIAG: M54.42,M54.41,G89.29 (ICD-10-CM) - Chronic bilateral low back pain with bilateral sciatica M48.062 (ICD-10-CM) - Spinal stenosis of lumbar region with neurogenic claudication  THERAPY DIAG:  Muscle weakness (generalized)  Other symptoms and signs involving the musculoskeletal system  Low back pain, unspecified back pain laterality, unspecified chronicity, unspecified whether sciatica present  Rationale for Evaluation and Treatment: Rehabilitation  ONSET DATE: 08/17/2023 acute exacerbation    SUBJECTIVE STATEMENT: PT  continues to be pain-free  PERTINENT HISTORY: 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 Are you having pain? Yes: 0 Pain location: pain in low back and left side Pain description: aching, sore Aggravating factors: mornings, prolonged stand or walk Relieving factors: sit in recliner and don't move PRECAUTIONS: None  WEIGHT BEARING RESTRICTIONS: No  FALLS:  Has patient fallen in last 6 months? No  LIVING ENVIRONMENT: Lives with: lives with their family Lives in: Mobile home Stairs: Yes: External: 5 steps; can reach both Has following equipment at home: Single point cane and Wheelchair (manual)  OCCUPATION: retired   PLOF: Independent  PATIENT GOALS: less pain and to be able to stand and walk longer.  NEXT MD VISIT: not scheduled   OBJECTIVE:   DIAGNOSTIC FINDINGS:  Narrative & Impression  CLINICAL DATA:  Chronic bilateral low back pain with bilateral sciatica     COMPARISON:  No prior MRI of the lumbar spine is available, an MRI IMPRESSION: 1. L4-L5 mild bilateral neural foraminal narrowing. Narrowing  of the lateral recesses at this level could affect the descending L5 nerve roots. 2. Narrowing of the right lateral recess at L2-L3, which could affect the descending right L3 nerve roots. 3. Multilevel facet arthropathy, which is moderate at L1-L2 and L2-L3 bilaterally and on the right at L4-L5 and L5-S1, which can be a cause of back pain. In addition, medially directed facet synovial cysts are noted on the right at L1-L2 and L2-L3, indenting on the posterior aspect of the thecal sac, but not causing significant spinal canal stenosis.     Electronically Signed   By: Wiliam Ke M.D.   On: 02/14/2023 13:31      PATIENT SURVEYS:  FOTO 48 04/17/23:  67  COGNITION: Overall cognitive status: Within functional limits for tasks assessed    MUSCLE LENGTH: Hamstrings: Right 140 deg; Left  130 deg   POSTURE:  rounded shoulders, forward head, decreased lumbar lordosis, and increased thoracic kyphosis    Trunk ROM: extension: 12 degrees with reps increasing pain                      Flexion:  fingers just below knees with increased pain.     LOWER EXTREMITY MMT:  MMT Right eval 04/17/23 Left eval 04/17/23  Hip flexion 5 5 2+ 4  Hip extension 2 2+ 2 3-  Hip abduction 5 5 3 4   Hip adduction      Hip internal rotation      Hip external rotation      Knee flexion 3+ 5 3+ 5  Knee extension 5 5 4 5   Ankle dorsiflexion 4 5 4 5   Ankle plantarflexion      Ankle inversion      Ankle eversion       (Blank rows = not tested) FUNCTIONAL TESTS: 03/13/23 30 seconds chair stand test: 7; 8 is poor for pt age and sex 04/17/23 8 x in 30 seconds, poor for age and sex  2 minute walk test: 294 with significant increased pain.   04/17/23:  378 ft in two minutes tired but no pain. Single leg stance:  Rt:  11"; Lt: 20"   TODAY'S TREATMENT:                                                                                                                              DATE:  04/17/23:re-assessment see above  Sit to stand x 10 Functional squat x 10  Bridge x 10  Leg press 4pl x 15  Nustep hills 3, level 3 x 7 minutes.  04/09/23: Theraband Green Scapular retraction x20-HEP Rows x15-HEP Extension  x 15-HEP Pallof x 10 Squat to heel raise at counter x 10  Side lying :  Clam x 10 B Hip abduction x 10  Supine Bridge x 15  Knee to chest x 3  04/07/23 Theraband: blue Scapular retraction x15 Rows x15 Extension  x 15 Pallof x 10 Squat  with L of arms to Rt then Lt equals 1 x 5 Marching x 15  Wall plank x 30" x 2  Sitting  Thoracic extension x 15  Sitting tall with glut and abdominal set x 10  Sit to stand x 10  Standing excursions x 3  Leg press 4 Pls x 10  Supine: Bridge x 10  Knee to chest x 3 Pelvic tilt x 10 LTR x 5  04/02/2023 Standing : Blue theraband: Scapular  retraction x 10 Rows x 10 Shoulder extension x 10  Functional squat x 10  Marching x 10 Sitting:  Sitting tall x 10  Sit to stand x 10  Glut set with ab set combination x 10 Supine: Trunk rotation x 10  Pelvic tilt x 10 Bridge x 10  Knee to chest x 3 for 30" each with towel  PATIENT EDUCATION:  Education details: HPE Person educated: Patient Education method: Explanation Education comprehension: returned demonstration  HOME EXERCISE PROGRAM: Access Code: [email or web address] URL: [email or web address] Date: 04/09/23 Postural exercises green for scapular retraction, row and extension  Date: 03/24/23  Supine Bridge  - 1 x daily - 7 x weekly - 1 sets - 10 reps - 3-5" hold - Sidelying Hip Abduction  - 1 x daily - 7 x weekly - 1 sets - 10 reps - 3-5" hold - Clamshell  - 1 x daily - 7 x weekly - 1 sets - 10 reps - 3-5" hold - Heel Raises with Counter Support  - 1 x daily - 7 x weekly - 1 sets - 10 reps - 3-5 hold - Mini Squat  - 1 x daily - 7 x weekly - 1 sets - 10 reps - 3-5 hold - Seated Transversus Abdominis Bracing  - 10 x daily - 7 x weekly - 1 sets - 10 reps - 3-5" hold  Date: 03/13/2023 Prepared by: Virgina Organ Exercises - Supine Single Knee to Chest Stretch  - 2 x daily - 7 x weekly - 3 sets - 3 reps - 30" hold - Supine Double Knee to Chest  - 2 x daily - 7 x weekly - 3 sets - 3 reps - 30" hold  ASSESSMENT:  CLINICAL IMPRESSION:   Pt reassessed today.  She has made good gains in all aspects but continues to have decreased activity tolerance, decreased strength and decreased balance.  Ms. Peaden will continue to benefit from skilled PT to address these issues and maximize her functional ability.     OBJECTIVE IMPAIRMENTS: .   Abnormal gait, cardiopulmonary status limiting activity, decreased activity tolerance, decreased endurance, decreased mobility, difficulty walking, decreased ROM, decreased strength, hypomobility, increased fascial restrictions, impaired perceived  functional ability, increased muscle spasms, impaired flexibility, impaired sensation, and pain.   ACTIVITY LIMITATIONS:  carrying, lifting, bending, sitting, standing, squatting, sleeping, stairs, transfers, bed mobility, continence, bathing, locomotion level, and caring for others   PARTICIPATION LIMITATIONS:  carrying, lifting, bending, sitting, standing, squatting, sleeping, stairs, transfers, bed mobility, continence, bathing, locomotion level, and caring for others   PERSONAL FACTORS: Time since onset of injury/illness/exacerbation are also affecting patient's functional outcome.   REHAB POTENTIAL: Fair    CLINICAL DECISION MAKING: Evolving/moderate complexity  EVALUATION COMPLEXITY: Moderate   GOALS: Goals reviewed with patient? No  SHORT TERM GOALS: Target date: 04/03/23 Pt to be I in HEP in order to decrease her pain to no greater than a 7/10 Baseline: Goal status: MET  2.  Pt to be able to stand for 15 minutes  without increased pain  Baseline:  Goal status: MET  3.  Pt to be able to walk for 15 minutes without increased pain  Baseline:  Goal status: IN PROGRESS    LONG TERM GOALS: Target date: 04/24/23  Pt to be I in an advanced HEP in order to decrease her pain to no greater than a 4/10 Baseline:  Goal status: MET  2.   Pt to be able to stand for 25 minutes without increased pain to make a meal  Baseline:  Goal status: MET  3.  Pt to be able to walk for 30 minutes without increased pain to allow pt to grocery shop in comfort.  Baseline:  Goal status: IN PROGRESS  4.  PT mm strength to have increased 1 grade to be able to come from sit to stand with ease.  Baseline:  Goal status: IN PROGRESS     PLAN:  PT FREQUENCY: 2x/week  PT DURATION: 6 weeks   PLANNED INTERVENTIONS: Therapeutic exercises, Therapeutic activity, Neuromuscular re-education, Balance training, Gait training, Patient/Family education, Self Care, and Joint mobilization   PLAN FOR  NEXT SESSION:Progress lumbar stabilization program, balance as well as gluteal strength.  Update HEP weekly or PRN.  Virgina Organ, PT CLT [phone]  04/17/2023, 11:00 PM

## 2023-04-22 ENCOUNTER — Ambulatory Visit (HOSPITAL_COMMUNITY): Payer: Medicare Other | Admitting: Physical Therapy

## 2023-04-22 DIAGNOSIS — M545 Low back pain, unspecified: Secondary | ICD-10-CM

## 2023-04-22 DIAGNOSIS — R29898 Other symptoms and signs involving the musculoskeletal system: Secondary | ICD-10-CM

## 2023-04-22 DIAGNOSIS — M6281 Muscle weakness (generalized): Secondary | ICD-10-CM

## 2023-04-22 NOTE — Therapy (Signed)
OUTPATIENT PHYSICAL THERAPY TREATMENT   Patient Name: Maria Fry MRN: [id number] DOB:1951-04-30, 72 y.o., female Today's Date: 04/22/2023   END OF SESSION:   PT End of Session - 04/22/23 1327     Visit Number 10    Number of Visits 17    Date for PT Re-Evaluation 05/17/23    Authorization Type [employer] medicare    Authorization - Number of Visits 17    Progress Note Due on Visit 19    PT Start Time 1308    PT Stop Time 1346    PT Time Calculation (min) 38 min    Activity Tolerance Patient limited by fatigue    Behavior During Therapy [facility] for tasks assessed/performed              Past Medical History:  Diagnosis Date   Arthritis    Right knee   Atrial fibrillation (HCC)    CAD (coronary artery disease)    a. 06/07/15 NSTEMI/Cath: Dist LAD 100%, mid LAD 10%. Otw nl cors. Nl EF w/ inferoapical AK.   Essential hypertension    Hip pain    History of cardiomyopathy 04/23/2016   a. 04/2016 Echo: EF 45%, Gr2 DD; b. 11/2019 Echo: EF 45-50%, mod LVH. Gr2 DD. Nl RV fxn. Sev dil LA.    Hyperlipidemia    Hypothyroidism    NSTEMI (non-ST elevated myocardial infarction) (HCC) 06/05/2015   Vitamin D deficiency    Past Surgical History:  Procedure Laterality Date   ABDOMINAL HYSTERECTOMY     partial- one ovary remaining   BIOPSY  09/17/2020   Procedure: BIOPSY;  Surgeon: Lanelle Bal, DO;  [facility]: [facility];  Service: Endoscopy;;   CARDIAC CATHETERIZATION N/A 06/06/2015   Procedure: Left Heart Cath and Coronary Angiography;  Surgeon: Peter M Swaziland, MD;  Location: [facility] INVASIVE CV LAB;  Service: Cardiovascular;  Laterality: N/A;   CARDIOVERSION N/A 09/14/2020   Procedure: CARDIOVERSION;  Surgeon: Jonelle Sidle, MD;  Location: [facility];  Service: Cardiovascular;  Laterality: N/A;   CARDIOVERSION N/A 06/20/2022   Procedure: CARDIOVERSION;  Surgeon: Antoine Poche, MD;  Location: [facility];  Service: Endoscopy;  Laterality: N/A;   CARDIOVERSION N/A 01/01/2023    Procedure: CARDIOVERSION;  Surgeon: Jodelle Red, MD;  Location: [facility] ENDOSCOPY;  Service: Cardiovascular;  Laterality: N/A;   CHOLECYSTECTOMY     COLONOSCOPY     COLONOSCOPY N/A 08/13/2017   Procedure: COLONOSCOPY;  Surgeon: Corbin Ade, MD;  Location: [facility];  Service: Endoscopy;  Laterality: N/A;  8:30 AM   COLONOSCOPY WITH PROPOFOL N/A 01/18/2021   [name]: Multiple tubular adenomas removed, next colonoscopy in 3 years   ESOPHAGOGASTRODUODENOSCOPY N/A 09/11/2016   Procedure: ESOPHAGOGASTRODUODENOSCOPY (EGD);  Surgeon: Corbin Ade, MD;  [facility]: [facility];  Service: Endoscopy;  Laterality: N/A;  7:45 am - moved to 10/11 @ 10:30   ESOPHAGOGASTRODUODENOSCOPY (EGD) WITH PROPOFOL N/A 09/17/2020   [name]: Diffuse moderate inflammation characterized by erosions and erythema found in the entire stomach.  Biopsies positive for H. pylori.  Patient has not been treated due to drug allergies.   Heel spurs Left    MALONEY DILATION N/A 09/11/2016   Procedure: [name] DILATION;  Surgeon: Corbin Ade, MD;  Location: [facility];  Service: Endoscopy;  Laterality: N/A;   POLYPECTOMY  08/13/2017   Procedure: POLYPECTOMY;  Surgeon: Corbin Ade, MD;  [facility]: [facility];  Service: Endoscopy;;  colon   POLYPECTOMY  01/18/2021   Procedure:  POLYPECTOMY;  Surgeon: Corbin Ade, MD;  Location: [facility];  Service: Endoscopy;;   SHOULDER ARTHROSCOPY Right    TUBAL LIGATION     Patient Active Problem List   Diagnosis Date Noted   Meralgia paresthetica 11/20/2022   Lumbar back pain 09/25/2022   Encounter for general adult medical examination with abnormal findings 09/25/2022   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HCC) 07/01/2022   Elevated brain natriuretic peptide (BNP) level 06/19/2022   Insomnia 06/19/2022   Chest pain 06/18/2022   Hypomagnesemia 09/05/2021   Wheezing 03/06/2021   Sleep apnea 02/06/2021   Vitamin D insufficiency 01/30/2021   Encounter to establish care 01/30/2021    Helicobacter pylori gastritis 11/01/2020   H/O adenomatous polyp of colon 10/31/2020   Constipation 10/15/2020   History of GI bleed 10/15/2020   Persistent atrial fibrillation (HCC) 10/10/2020   Hypercoagulable state due to persistent atrial fibrillation (HCC) 10/05/2020   Obesity, Class III, BMI 40-49.9 (morbid obesity) (HCC)    Gastroesophageal reflux disease    Gastritis    Acquired hypothyroidism    Melena    CHF exacerbation (HCC) 09/15/2020   Atrial fibrillation, chronic (HCC) 08/23/2020   Stable angina 01/14/2020   Abnormal nuclear stress test 12/08/2019   Iron deficiency anemia 11/28/2019   Mixed hyperlipidemia 11/27/2019   OA (osteoarthritis) of knee 05/18/2019   DDD (degenerative disc disease), lumbar 05/18/2019   Obesity 12/26/2017   Seasonal allergies 08/19/2016   Glucose intolerance (impaired glucose tolerance) 05/14/2016   Chronic hip pain 05/14/2016   Chronic combined systolic and diastolic heart failure (HCC) 05/14/2016   Hypothyroidism following radioiodine therapy 11/30/2015   CAD (coronary artery disease)    NSTEMI (non-ST elevated myocardial infarction) (HCC)    Essential hypertension 06/05/2015    PCP: Christel Mormon  REFERRING PROVIDER: Suanne Marker, MD  REFERRING DIAG: M54.42,M54.41,G89.29 (ICD-10-CM) - Chronic bilateral low back pain with bilateral sciatica M48.062 (ICD-10-CM) - Spinal stenosis of lumbar region with neurogenic claudication  THERAPY DIAG:  Muscle weakness (generalized)  Other symptoms and signs involving the musculoskeletal system  Low back pain, unspecified back pain laterality, unspecified chronicity, unspecified whether sciatica present  Rationale for Evaluation and Treatment: Rehabilitation  ONSET DATE: 08/17/2023 acute exacerbation    SUBJECTIVE STATEMENT: Pt states she joined the [facility] and she is a little sore today.  States she used the treadmill, nustep but none of the weight machines.  Pt states they  have a lot of equipment.  Pt states she has a hard time with stairs.   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 Are you having pain? Yes: 0 Pain location: pain in low back and left side Pain description: aching, sore Aggravating factors: mornings, prolonged stand or walk Relieving factors: sit in recliner and don't move PRECAUTIONS: None  WEIGHT BEARING RESTRICTIONS: No  FALLS:  Has patient fallen in last 6 months? No  LIVING ENVIRONMENT: Lives with: lives with their family Lives in: Mobile home Stairs: Yes: External: 5 steps; can reach both Has following equipment at home: Single point cane and Wheelchair (manual)  OCCUPATION: retired   PLOF: Independent  PATIENT GOALS: less pain and to be able to stand and walk longer.  NEXT MD VISIT: not scheduled   OBJECTIVE:   DIAGNOSTIC FINDINGS:  Narrative & Impression  CLINICAL DATA:  Chronic bilateral low back pain with bilateral sciatica     COMPARISON:  No prior MRI of  the lumbar spine is available, an MRI IMPRESSION: 1. L4-L5 mild bilateral neural foraminal narrowing. Narrowing of the lateral recesses at this level could affect the descending L5 nerve roots. 2. Narrowing of the right lateral recess at L2-L3, which could affect the descending right L3 nerve roots. 3. Multilevel facet arthropathy, which is moderate at L1-L2 and L2-L3 bilaterally and on the right at L4-L5 and L5-S1, which can be a cause of back pain. In addition, medially directed facet synovial cysts are noted on the right at L1-L2 and L2-L3, indenting on the posterior aspect of the thecal sac, but not causing significant spinal canal stenosis.     Electronically Signed   By: Wiliam Ke M.D.   On: 02/14/2023 13:31      PATIENT SURVEYS:  FOTO 48 04/17/23:  67  COGNITION: Overall cognitive status: Within functional limits for tasks  assessed    MUSCLE LENGTH: Hamstrings: Right 140 deg; Left 130 deg   POSTURE:  rounded shoulders, forward head, decreased lumbar lordosis, and increased thoracic kyphosis    Trunk ROM: extension: 12 degrees with reps increasing pain                      Flexion:  fingers just below knees with increased pain.     LOWER EXTREMITY MMT:  MMT Right eval 04/17/23 Left eval 04/17/23  Hip flexion 5 5 2+ 4  Hip extension 2 2+ 2 3-  Hip abduction 5 5 3 4   Hip adduction      Hip internal rotation      Hip external rotation      Knee flexion 3+ 5 3+ 5  Knee extension 5 5 4 5   Ankle dorsiflexion 4 5 4 5   Ankle plantarflexion      Ankle inversion      Ankle eversion       (Blank rows = not tested)   FUNCTIONAL TESTS: 03/13/23 30 seconds chair stand test: 7; 8 is poor for pt age and sex 04/17/23 8 x in 30 seconds, poor for age and sex  2 minute walk test: 294 with significant increased pain.   04/17/23:  378 ft in two minutes tired but no pain. Single leg stance:  Rt:  11"; Lt: 20"   TODAY'S TREATMENT:                                                                                                                              DATE:  04/22/23 Nustep hills 3, level 3 x 5 minutes. Standing: Functional squat 2x10   Paloff GTB 10X2 each direction  7" forward step ups 10X each with 1 UE assist  7" lateral step ups 10X each with 1 UE assist Seated:  Sit to stand  2x10 Leg press 4pl  2x10   04/17/23:re-assessment see above  Sit to stand x 10 Functional squat x 10  Bridge x 10  Leg press 4pl x 15  Nustep hills 3, level 3 x 7 minutes.   04/09/23: Theraband Green Scapular retraction x20-HEP Rows x15-HEP Extension  x 15-HEP Pallof x 10 Squat to heel raise at counter x 10  Side lying :  Clam x 10 B Hip abduction x 10  Supine Bridge x 15  Knee to chest x 3   04/07/23 Theraband: blue Scapular retraction x15 Rows x15 Extension  x 15 Pallof x 10 Squat with L of arms to Rt then Lt  equals 1 x 5 Marching x 15  Wall plank x 30" x 2  Sitting  Thoracic extension x 15  Sitting tall with glut and abdominal set x 10  Sit to stand x 10  Standing excursions x 3  Leg press 4 Pls x 10  Supine: Bridge x 10  Knee to chest x 3 Pelvic tilt x 10 LTR x 5   04/02/2023 Standing : Blue theraband: Scapular retraction x 10 Rows x 10 Shoulder extension x 10  Functional squat x 10  Marching x 10 Sitting:  Sitting tall x 10  Sit to stand x 10  Glut set with ab set combination x 10 Supine: Trunk rotation x 10  Pelvic tilt x 10 Bridge x 10  Knee to chest x 3 for 30" each with towel  PATIENT EDUCATION:  Education details: HPE Person educated: Patient Education method: Explanation Education comprehension: returned demonstration  HOME EXERCISE PROGRAM: Access Code: [email or web address] URL: [email or web address] Date: 04/09/23 Postural exercises green for scapular retraction, row and extension  Date: 03/24/23  Supine Bridge  - 1 x daily - 7 x weekly - 1 sets - 10 reps - 3-5" hold - Sidelying Hip Abduction  - 1 x daily - 7 x weekly - 1 sets - 10 reps - 3-5" hold - Clamshell  - 1 x daily - 7 x weekly - 1 sets - 10 reps - 3-5" hold - Heel Raises with Counter Support  - 1 x daily - 7 x weekly - 1 sets - 10 reps - 3-5 hold - Mini Squat  - 1 x daily - 7 x weekly - 1 sets - 10 reps - 3-5 hold - Seated Transversus Abdominis Bracing  - 10 x daily - 7 x weekly - 1 sets - 10 reps - 3-5" hold  Date: 03/13/2023 Prepared by: Virgina Organ Exercises - Supine Single Knee to Chest Stretch  - 2 x daily - 7 x weekly - 3 sets - 3 reps - 30" hold - Supine Double Knee to Chest  - 2 x daily - 7 x weekly - 3 sets - 3 reps - 30" hold  ASSESSMENT:  CLINICAL IMPRESSION:   focus remains on improving LE strength, activity tolerance and stability.  Good stability exhibited for paloff activity on both sides with cues needed for form while completing step ups. Noted eccentric weakness with  activity but able to complete with only 1 UE assist.  Pt required two short seated rest breaks during session today.  No pain or issues voiced today. Pt will continue to benefit from skilled therapy.   OBJECTIVE IMPAIRMENTS: .   Abnormal gait, cardiopulmonary status limiting activity, decreased activity tolerance, decreased endurance, decreased mobility, difficulty walking, decreased ROM, decreased strength, hypomobility, increased fascial restrictions, impaired perceived functional ability, increased muscle spasms, impaired flexibility, impaired sensation, and pain.   ACTIVITY LIMITATIONS:  carrying, lifting, bending, sitting, standing, squatting, sleeping, stairs, transfers, bed mobility, continence, bathing, locomotion level, and caring for others   PARTICIPATION LIMITATIONS:  carrying, lifting, bending, sitting, standing, squatting, sleeping, stairs, transfers, bed mobility, continence, bathing, locomotion level, and caring for others   PERSONAL FACTORS: Time since onset of injury/illness/exacerbation are also affecting patient's functional outcome.   REHAB POTENTIAL: Fair    CLINICAL DECISION MAKING: Evolving/moderate complexity  EVALUATION COMPLEXITY: Moderate   GOALS: Goals reviewed with patient? No  SHORT TERM GOALS: Target date: 04/03/23 Pt to be I in HEP in order to decrease her pain to no greater than a 7/10 Baseline: Goal status: MET  2.  Pt to be able to stand for 15 minutes without increased pain  Baseline:  Goal status: MET  3.  Pt to be able to walk for 15 minutes without increased pain  Baseline:  Goal status: IN PROGRESS    LONG TERM GOALS: Target date: 04/24/23  Pt to be I in an advanced HEP in order to decrease her pain to no greater than a 4/10 Baseline:  Goal status: MET  2.   Pt to be able to stand for 25 minutes without increased pain to make a meal  Baseline:  Goal status: MET  3.  Pt to be able to walk for 30 minutes without increased pain to  allow pt to grocery shop in comfort.  Baseline:  Goal status: IN PROGRESS  4.  PT mm strength to have increased 1 grade to be able to come from sit to stand with ease.  Baseline:  Goal status: IN PROGRESS     PLAN:  PT FREQUENCY: 2x/week  PT DURATION: 6 weeks   PLANNED INTERVENTIONS: Therapeutic exercises, Therapeutic activity, Neuromuscular re-education, Balance training, Gait training, Patient/Family education, Self Care, and Joint mobilization   PLAN FOR NEXT SESSION:Progress lumbar stabilization program, balance as well as gluteal strength.  Update HEP weekly or PRN.   Lurena Nida, PTA/CLT [facility] Ph: [phone]   Lurena Nida, PTA 04/22/2023, 3:52 PM

## 2023-04-24 ENCOUNTER — Ambulatory Visit (HOSPITAL_COMMUNITY): Payer: Medicare Other | Admitting: Physical Therapy

## 2023-04-24 DIAGNOSIS — M6281 Muscle weakness (generalized): Secondary | ICD-10-CM

## 2023-04-24 DIAGNOSIS — R29898 Other symptoms and signs involving the musculoskeletal system: Secondary | ICD-10-CM

## 2023-04-24 DIAGNOSIS — M545 Low back pain, unspecified: Secondary | ICD-10-CM

## 2023-04-24 NOTE — Therapy (Signed)
OUTPATIENT PHYSICAL THERAPY TREATMENT   Patient Name: ADDYSON READ MRN: [id number] DOB:04-21-1951, 72 y.o., female Today's Date: 04/24/2023   END OF SESSION:   PT End of Session - 04/24/23 1049     Visit Number 11    Number of Visits 13    Date for PT Re-Evaluation 05/17/23    Authorization Type [employer] medicare    Authorization - Visit Number 10    Authorization - Number of Visits 13    Progress Note Due on Visit 19    PT Start Time 1045    PT Stop Time 1125    PT Time Calculation (min) 40 min    Activity Tolerance Patient limited by fatigue    Behavior During Therapy [facility] for tasks assessed/performed              Past Medical History:  Diagnosis Date   Arthritis    Right knee   Atrial fibrillation (HCC)    CAD (coronary artery disease)    a. 06/07/15 NSTEMI/Cath: Dist LAD 100%, mid LAD 10%. Otw nl cors. Nl EF w/ inferoapical AK.   Essential hypertension    Hip pain    History of cardiomyopathy 04/23/2016   a. 04/2016 Echo: EF 45%, Gr2 DD; b. 11/2019 Echo: EF 45-50%, mod LVH. Gr2 DD. Nl RV fxn. Sev dil LA.    Hyperlipidemia    Hypothyroidism    NSTEMI (non-ST elevated myocardial infarction) (HCC) 06/05/2015   Vitamin D deficiency    Past Surgical History:  Procedure Laterality Date   ABDOMINAL HYSTERECTOMY     partial- one ovary remaining   BIOPSY  09/17/2020   Procedure: BIOPSY;  Surgeon: Lanelle Bal, DO;  [facility]: [facility];  Service: Endoscopy;;   CARDIAC CATHETERIZATION N/A 06/06/2015   Procedure: Left Heart Cath and Coronary Angiography;  Surgeon: Peter M Swaziland, MD;  Location: [facility] [facility] CV LAB;  Service: Cardiovascular;  Laterality: N/A;   CARDIOVERSION N/A 09/14/2020   Procedure: CARDIOVERSION;  Surgeon: Jonelle Sidle, MD;  Location: [facility];  Service: Cardiovascular;  Laterality: N/A;   CARDIOVERSION N/A 06/20/2022   Procedure: CARDIOVERSION;  Surgeon: Antoine Poche, MD;  Location: [facility];  Service: Endoscopy;  Laterality: N/A;    CARDIOVERSION N/A 01/01/2023   Procedure: CARDIOVERSION;  Surgeon: Jodelle Red, MD;  Location: [facility]OS[facility];  Service: Cardiovascular;  Laterality: N/A;   CHOLECYSTECTOMY     COLONOSCOPY     COLONOSCOPY N/A 08/13/2017   Procedure: COLONOSCOPY;  Surgeon: Corbin Ade, MD;  Location: [facility];  Service: Endoscopy;  Laterality: N/A;  8:30 AM   COLONOSCOPY WITH PROPOFOL N/A 01/18/2021   [name]: Multiple tubular adenomas removed, next colonoscopy in 3 years   ESOPHAGOGASTRODUODENOSCOPY N/A 09/11/2016   Procedure: ESOPHAGOGASTRODUODENOSCOPY (EGD);  Surgeon: Corbin Ade, MD;  Location: [facility];  Service: Endoscopy;  Laterality: N/A;  7:45 am - moved to 10/11 @ 10:30   ESOPHAGOGASTRODUODENOSCOPY (EGD) WITH PROPOFOL N/A 09/17/2020   [name]: Diffuse moderate inflammation characterized by erosions and erythema found in the entire stomach.  Biopsies positive for H. pylori.  Patient has not been treated due to drug allergies.   Heel spurs Left    MALONEY DILATION N/A 09/11/2016   Procedure: [name] DILATION;  Surgeon: Corbin Ade, MD;  Location: [facility];  Service: Endoscopy;  Laterality: N/A;   POLYPECTOMY  08/13/2017   Procedure: POLYPECTOMY;  Surgeon: Corbin Ade, MD;  Location: [facility];  Service: Endoscopy;;  colon  POLYPECTOMY  01/18/2021   Procedure: POLYPECTOMY;  Surgeon: Corbin Ade, MD;  Location: [facility];  Service: Endoscopy;;   SHOULDER ARTHROSCOPY Right    TUBAL LIGATION     Patient Active Problem List   Diagnosis Date Noted   Meralgia paresthetica 11/20/2022   Lumbar back pain 09/25/2022   Encounter for general adult medical examination with abnormal findings 09/25/2022   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HCC) 07/01/2022   Elevated brain natriuretic peptide (BNP) level 06/19/2022   Insomnia 06/19/2022   Chest pain 06/18/2022   Hypomagnesemia 09/05/2021   Wheezing 03/06/2021   Sleep apnea 02/06/2021   Vitamin D insufficiency 01/30/2021   Encounter  to establish care 01/30/2021   Helicobacter pylori gastritis 11/01/2020   H/O adenomatous polyp of colon 10/31/2020   Constipation 10/15/2020   History of GI bleed 10/15/2020   Persistent atrial fibrillation (HCC) 10/10/2020   Hypercoagulable state due to persistent atrial fibrillation (HCC) 10/05/2020   Obesity, Class III, BMI 40-49.9 (morbid obesity) (HCC)    Gastroesophageal reflux disease    Gastritis    Acquired hypothyroidism    Melena    CHF exacerbation (HCC) 09/15/2020   Atrial fibrillation, chronic (HCC) 08/23/2020   Stable angina 01/14/2020   Abnormal nuclear stress test 12/08/2019   Iron deficiency anemia 11/28/2019   Mixed hyperlipidemia 11/27/2019   OA (osteoarthritis) of knee 05/18/2019   DDD (degenerative disc disease), lumbar 05/18/2019   Obesity 12/26/2017   Seasonal allergies 08/19/2016   Glucose intolerance (impaired glucose tolerance) 05/14/2016   Chronic hip pain 05/14/2016   Chronic combined systolic and diastolic heart failure (HCC) 05/14/2016   Hypothyroidism following radioiodine therapy 11/30/2015   CAD (coronary artery disease)    NSTEMI (non-ST elevated myocardial infarction) (HCC)    Essential hypertension 06/05/2015    PCP: Christel Mormon  REFERRING PROVIDER: Suanne Marker, MD  REFERRING DIAG: M54.42,M54.41,G89.29 (ICD-10-CM) - Chronic bilateral low back pain with bilateral sciatica M48.062 (ICD-10-CM) - Spinal stenosis of lumbar region with neurogenic claudication  THERAPY DIAG:  No diagnosis found.  Rationale for Evaluation and Treatment: Rehabilitation  ONSET DATE: 08/17/2023 acute exacerbation    SUBJECTIVE STATEMENT: Pt states making her bed is the most difficult thing she does at home.  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 Are you having pain? Yes: 0 Pain location: pain in low back  and left side Pain description: aching, sore Aggravating factors: mornings, prolonged stand or walk Relieving factors: sit in recliner and don't move PRECAUTIONS: None  WEIGHT BEARING RESTRICTIONS: No  FALLS:  Has patient fallen in last 6 months? No  LIVING ENVIRONMENT: Lives with: lives with their family Lives in: Mobile home Stairs: Yes: External: 5 steps; can reach both Has following equipment at home: Single point cane and Wheelchair (manual)  OCCUPATION: retired   PLOF: Independent  PATIENT GOALS: less pain and to be able to stand and walk longer.  NEXT MD VISIT: not scheduled   OBJECTIVE:   DIAGNOSTIC FINDINGS:  Narrative & Impression  CLINICAL DATA:  Chronic bilateral low back pain with bilateral sciatica     COMPARISON:  No prior MRI of the lumbar spine is available, an MRI IMPRESSION: 1. L4-L5 mild bilateral neural foraminal narrowing. Narrowing of the lateral recesses at this level could affect the descending L5 nerve roots. 2. Narrowing of the right lateral recess at L2-L3, which  could affect the descending right L3 nerve roots. 3. Multilevel facet arthropathy, which is moderate at L1-L2 and L2-L3 bilaterally and on the right at L4-L5 and L5-S1, which can be a cause of back pain. In addition, medially directed facet synovial cysts are noted on the right at L1-L2 and L2-L3, indenting on the posterior aspect of the thecal sac, but not causing significant spinal canal stenosis.     Electronically Signed   By: Wiliam Ke M.D.   On: 02/14/2023 13:31      PATIENT SURVEYS:  FOTO 48 04/17/23:  67  COGNITION: Overall cognitive status: Within functional limits for tasks assessed    MUSCLE LENGTH: Hamstrings: Right 140 deg; Left 130 deg   POSTURE:  rounded shoulders, forward head, decreased lumbar lordosis, and increased thoracic kyphosis    Trunk ROM: extension: 12 degrees with reps increasing pain                      Flexion:  fingers just  below knees with increased pain.     LOWER EXTREMITY MMT:  MMT Right eval 04/17/23 Left eval 04/17/23  Hip flexion 5 5 2+ 4  Hip extension 2 2+ 2 3-  Hip abduction 5 5 3 4   Hip adduction      Hip internal rotation      Hip external rotation      Knee flexion 3+ 5 3+ 5  Knee extension 5 5 4 5   Ankle dorsiflexion 4 5 4 5   Ankle plantarflexion      Ankle inversion      Ankle eversion       (Blank rows = not tested)   FUNCTIONAL TESTS: 03/13/23 30 seconds chair stand test: 7; 8 is poor for pt age and sex 04/17/23 8 x in 30 seconds, poor for age and sex  2 minute walk test: 294 with significant increased pain.   04/17/23:  378 ft in two minutes tired but no pain. Single leg stance:  Rt:  11"; Lt: 20"   TODAY'S TREATMENT:                                                                                                                              DATE:  04/24/23 Lunging both Rt and Lt x 10 Sit to stand x 15 Heel raise x 15  Single leg stance x 5 B  Sitting lumbar stretch using green ball straight, to the Right and then to the left x 5 each  Thoracic excursion x 3  Marching one hand hold holding for 5 seconds each x 10  Football squat side stepping x 2 RT  Leg press 4 Pl x 10 2 sets  Rows on body craft 2 pl x 5 2 reps each  04/22/23 Nustep hills 3, level 3 x 5 minutes. Standing: Functional squat 2x10   Paloff GTB 10X2 each direction  7" forward step ups 10X each with 1  UE assist  7" lateral step ups 10X each with 1 UE assist Seated:  Sit to stand  2x10 Leg press 4pl  2x10   04/17/23:re-assessment see above  Sit to stand x 10 Functional squat x 10  Bridge x 10  Leg press 4pl x 15  Nustep hills 3, level 3 x 7 minutes.   04/09/23: Theraband Green Scapular retraction x20-HEP Rows x15-HEP Extension  x 15-HEP Pallof x 10 Squat to heel raise at counter x 10  Side lying :  Clam x 10 B Hip abduction x 10  Supine Bridge x 15  Knee to chest x 3   04/07/23 Theraband:  blue Scapular retraction x15 Rows x15 Extension  x 15 Pallof x 10 Squat with L of arms to Rt then Lt equals 1 x 5 Marching x 15  Wall plank x 30" x 2  Sitting  Thoracic extension x 15  Sitting tall with glut and abdominal set x 10  Sit to stand x 10  Standing excursions x 3  Leg press 4 Pls x 10  Supine: Bridge x 10  Knee to chest x 3 Pelvic tilt x 10 LTR x 5   04/02/2023 Standing : Blue theraband: Scapular retraction x 10 Rows x 10 Shoulder extension x 10  Functional squat x 10  Marching x 10 Sitting:  Sitting tall x 10  Sit to stand x 10  Glut set with ab set combination x 10 Supine: Trunk rotation x 10  Pelvic tilt x 10 Bridge x 10  Knee to chest x 3 for 30" each with towel  PATIENT EDUCATION:  Education details: HPE Person educated: Patient Education method: Explanation Education comprehension: returned demonstration  HOME EXERCISE PROGRAM: Access Code: [email or web address] URL: [email or web address] Date: 04/09/23 Postural exercises green for scapular retraction, row and extension  Date: 03/24/23  Supine Bridge  - 1 x daily - 7 x weekly - 1 sets - 10 reps - 3-5" hold - Sidelying Hip Abduction  - 1 x daily - 7 x weekly - 1 sets - 10 reps - 3-5" hold - Clamshell  - 1 x daily - 7 x weekly - 1 sets - 10 reps - 3-5" hold - Heel Raises with Counter Support  - 1 x daily - 7 x weekly - 1 sets - 10 reps - 3-5 hold - Mini Squat  - 1 x daily - 7 x weekly - 1 sets - 10 reps - 3-5 hold - Seated Transversus Abdominis Bracing  - 10 x daily - 7 x weekly - 1 sets - 10 reps - 3-5" hold  Date: 03/13/2023 Prepared by: Virgina Organ Exercises - Supine Single Knee to Chest Stretch  - 2 x daily - 7 x weekly - 3 sets - 3 reps - 30" hold - Supine Double Knee to Chest  - 2 x daily - 7 x weekly - 3 sets - 3 reps - 30" hold  ASSESSMENT:  CLINICAL IMPRESSION:  Pt continues to be completing her exercises at home and is working out at [facility].  PT feels that she will be ready  for discharge at the end of the week.   No pain or issues voiced today. Pt will continue to benefit from skilled therapy to promote balance, activity tolerance  and hip strength.   OBJECTIVE IMPAIRMENTS: .   Abnormal gait, cardiopulmonary status limiting activity, decreased activity tolerance, decreased endurance, decreased mobility, difficulty walking, decreased ROM, decreased strength, hypomobility, increased fascial restrictions, impaired perceived functional ability,  increased muscle spasms, impaired flexibility, impaired sensation, and pain.   ACTIVITY LIMITATIONS:  carrying, lifting, bending, sitting, standing, squatting, sleeping, stairs, transfers, bed mobility, continence, bathing, locomotion level, and caring for others   PARTICIPATION LIMITATIONS:  carrying, lifting, bending, sitting, standing, squatting, sleeping, stairs, transfers, bed mobility, continence, bathing, locomotion level, and caring for others   PERSONAL FACTORS: Time since onset of injury/illness/exacerbation are also affecting patient's functional outcome.   REHAB POTENTIAL: Fair    CLINICAL DECISION MAKING: Evolving/moderate complexity  EVALUATION COMPLEXITY: Moderate   GOALS: Goals reviewed with patient? No  SHORT TERM GOALS: Target date: 04/03/23 Pt to be I in HEP in order to decrease her pain to no greater than a 7/10 Baseline: Goal status: MET  2.  Pt to be able to stand for 15 minutes without increased pain  Baseline:  Goal status: MET  3.  Pt to be able to walk for 15 minutes without increased pain  Baseline:  Goal status: IN PROGRESS    LONG TERM GOALS: Target date: 04/24/23  Pt to be I in an advanced HEP in order to decrease her pain to no greater than a 4/10 Baseline:  Goal status: MET  2.   Pt to be able to stand for 25 minutes without increased pain to make a meal  Baseline:  Goal status: MET  3.  Pt to be able to walk for 30 minutes without increased pain to allow pt to grocery  shop in comfort.  Baseline:  Goal status: IN PROGRESS  4.  PT mm strength to have increased 1 grade to be able to come from sit to stand with ease.  Baseline:  Goal status: IN PROGRESS     PLAN:  PT FREQUENCY: 2x/week  PT DURATION: 6 weeks   PLANNED INTERVENTIONS: Therapeutic exercises, Therapeutic activity, Neuromuscular re-education, Balance training, Gait training, Patient/Family education, Self Care, and Joint mobilization   PLAN FOR NEXT SESSION:Progress lumbar stabilization program, balance as well as gluteal strength.  Update HEP weekly or PRN.   Virgina Organ, PT CLT [phone]  04/24/2023, 11:25 AM

## 2023-04-29 ENCOUNTER — Ambulatory Visit (HOSPITAL_COMMUNITY): Payer: Medicare Other | Admitting: Physical Therapy

## 2023-04-29 DIAGNOSIS — M6281 Muscle weakness (generalized): Secondary | ICD-10-CM

## 2023-04-29 DIAGNOSIS — R29898 Other symptoms and signs involving the musculoskeletal system: Secondary | ICD-10-CM

## 2023-04-29 DIAGNOSIS — M545 Low back pain, unspecified: Secondary | ICD-10-CM

## 2023-04-29 NOTE — Therapy (Signed)
OUTPATIENT PHYSICAL THERAPY TREATMENT   Patient Name: Maria Fry MRN: [id number] DOB:1951-03-24, 72 y.o., female Today's Date: 04/29/2023   END OF SESSION:   PT End of Session - 04/29/23 1456     Visit Number 12    Number of Visits 13    Date for PT Re-Evaluation 05/17/23    Authorization Type [employer] medicare    Authorization - Number of Visits 13    Progress Note Due on Visit 19    PT Start Time 1350    PT Stop Time 1430    PT Time Calculation (min) 40 min    Activity Tolerance Patient limited by fatigue    Behavior During Therapy [facility] for tasks assessed/performed                 Past Medical History:  Diagnosis Date   Arthritis    Right knee   Atrial fibrillation (HCC)    CAD (coronary artery disease)    a. 06/07/15 NSTEMI/Cath: Dist LAD 100%, mid LAD 10%. Otw nl cors. Nl EF w/ inferoapical AK.   Essential hypertension    Hip pain    History of cardiomyopathy 04/23/2016   a. 04/2016 Echo: EF 45%, Gr2 DD; b. 11/2019 Echo: EF 45-50%, mod LVH. Gr2 DD. Nl RV fxn. Sev dil LA.    Hyperlipidemia    Hypothyroidism    NSTEMI (non-ST elevated myocardial infarction) (HCC) 06/05/2015   Vitamin D deficiency    Past Surgical History:  Procedure Laterality Date   ABDOMINAL HYSTERECTOMY     partial- one ovary remaining   BIOPSY  09/17/2020   Procedure: BIOPSY;  Surgeon: Lanelle Bal, DO;  [facility]: [facility];  Service: Endoscopy;;   CARDIAC CATHETERIZATION N/A 06/06/2015   Procedure: Left Heart Cath and Coronary Angiography;  Surgeon: Peter M Swaziland, MD;  Location: [facility] INVASIVE CV LAB;  Service: Cardiovascular;  Laterality: N/A;   CARDIOVERSION N/A 09/14/2020   Procedure: CARDIOVERSION;  Surgeon: Jonelle Sidle, MD;  Location: [facility];  Service: Cardiovascular;  Laterality: N/A;   CARDIOVERSION N/A 06/20/2022   Procedure: CARDIOVERSION;  Surgeon: Antoine Poche, MD;  Location: [facility];  Service: Endoscopy;  Laterality: N/A;   CARDIOVERSION N/A 01/01/2023    Procedure: CARDIOVERSION;  Surgeon: Jodelle Red, MD;  Location: [facility] ENDOSCOPY;  Service: Cardiovascular;  Laterality: N/A;   CHOLECYSTECTOMY     COLONOSCOPY     COLONOSCOPY N/A 08/13/2017   Procedure: COLONOSCOPY;  Surgeon: Corbin Ade, MD;  Location: [facility];  Service: Endoscopy;  Laterality: N/A;  8:30 AM   COLONOSCOPY WITH PROPOFOL N/A 01/18/2021   [name]: Multiple tubular adenomas removed, next colonoscopy in 3 years   ESOPHAGOGASTRODUODENOSCOPY N/A 09/11/2016   Procedure: ESOPHAGOGASTRODUODENOSCOPY (EGD);  Surgeon: Corbin Ade, MD;  [facility]: [facility];  Service: Endoscopy;  Laterality: N/A;  7:45 am - moved to 10/11 @ 10:30   ESOPHAGOGASTRODUODENOSCOPY (EGD) WITH PROPOFOL N/A 09/17/2020   [name]: Diffuse moderate inflammation characterized by erosions and erythema found in the entire stomach.  Biopsies positive for H. pylori.  Patient has not been treated due to drug allergies.   Heel spurs Left    MALONEY DILATION N/A 09/11/2016   Procedure: [name] DILATION;  Surgeon: Corbin Ade, MD;  Location: [facility];  Service: Endoscopy;  Laterality: N/A;   POLYPECTOMY  08/13/2017   Procedure: POLYPECTOMY;  Surgeon: Corbin Ade, MD;  [facility]: [facility];  Service: Endoscopy;;  colon   POLYPECTOMY  01/18/2021  Procedure: POLYPECTOMY;  Surgeon: Corbin Ade, MD;  Location: [facility];  Service: Endoscopy;;   SHOULDER ARTHROSCOPY Right    TUBAL LIGATION     Patient Active Problem List   Diagnosis Date Noted   Meralgia paresthetica 11/20/2022   Lumbar back pain 09/25/2022   Encounter for general adult medical examination with abnormal findings 09/25/2022   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HCC) 07/01/2022   Elevated brain natriuretic peptide (BNP) level 06/19/2022   Insomnia 06/19/2022   Chest pain 06/18/2022   Hypomagnesemia 09/05/2021   Wheezing 03/06/2021   Sleep apnea 02/06/2021   Vitamin D insufficiency 01/30/2021   Encounter to establish care 01/30/2021    Helicobacter pylori gastritis 11/01/2020   H/O adenomatous polyp of colon 10/31/2020   Constipation 10/15/2020   History of GI bleed 10/15/2020   Persistent atrial fibrillation (HCC) 10/10/2020   Hypercoagulable state due to persistent atrial fibrillation (HCC) 10/05/2020   Obesity, Class III, BMI 40-49.9 (morbid obesity) (HCC)    Gastroesophageal reflux disease    Gastritis    Acquired hypothyroidism    Melena    CHF exacerbation (HCC) 09/15/2020   Atrial fibrillation, chronic (HCC) 08/23/2020   Stable angina 01/14/2020   Abnormal nuclear stress test 12/08/2019   Iron deficiency anemia 11/28/2019   Mixed hyperlipidemia 11/27/2019   OA (osteoarthritis) of knee 05/18/2019   DDD (degenerative disc disease), lumbar 05/18/2019   Obesity 12/26/2017   Seasonal allergies 08/19/2016   Glucose intolerance (impaired glucose tolerance) 05/14/2016   Chronic hip pain 05/14/2016   Chronic combined systolic and diastolic heart failure (HCC) 05/14/2016   Hypothyroidism following radioiodine therapy 11/30/2015   CAD (coronary artery disease)    NSTEMI (non-ST elevated myocardial infarction) (HCC)    Essential hypertension 06/05/2015    PCP: Christel Mormon  REFERRING PROVIDER: Suanne Marker, MD  REFERRING DIAG: M54.42,M54.41,G89.29 (ICD-10-CM) - Chronic bilateral low back pain with bilateral sciatica M48.062 (ICD-10-CM) - Spinal stenosis of lumbar region with neurogenic claudication  THERAPY DIAG:  No diagnosis found.  Rationale for Evaluation and Treatment: Rehabilitation  ONSET DATE: 08/17/2023 acute exacerbation    SUBJECTIVE STATEMENT: Pt reports no pain or issues today.  Feels she will be ready for discharge next session.  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 Are you having pain? Yes: 0 Pain location: pain in low back and left side Pain description: aching, sore Aggravating  factors: mornings, prolonged stand or walk Relieving factors: sit in recliner and don't move PRECAUTIONS: None  WEIGHT BEARING RESTRICTIONS: No  FALLS:  Has patient fallen in last 6 months? No  LIVING ENVIRONMENT: Lives with: lives with their family Lives in: Mobile home Stairs: Yes: External: 5 steps; can reach both Has following equipment at home: Single point cane and Wheelchair (manual)  OCCUPATION: retired   PLOF: Independent  PATIENT GOALS: less pain and to be able to stand and walk longer.  NEXT MD VISIT: not scheduled   OBJECTIVE:   DIAGNOSTIC FINDINGS:  Narrative & Impression  CLINICAL DATA:  Chronic bilateral low back pain with bilateral sciatica     COMPARISON:  No prior MRI of the lumbar spine is available, an MRI IMPRESSION: 1. L4-L5 mild bilateral neural foraminal narrowing. Narrowing of the lateral recesses at this level could affect the descending L5 nerve roots. 2. Narrowing of the right lateral recess at L2-L3, which could affect the descending right L3 nerve roots. 3. Multilevel facet arthropathy,  which is moderate at L1-L2 and L2-L3 bilaterally and on the right at L4-L5 and L5-S1, which can be a cause of back pain. In addition, medially directed facet synovial cysts are noted on the right at L1-L2 and L2-L3, indenting on the posterior aspect of the thecal sac, but not causing significant spinal canal stenosis.     Electronically Signed   By: Wiliam Ke M.D.   On: 02/14/2023 13:31      PATIENT SURVEYS:  FOTO 48 04/17/23:  67  COGNITION: Overall cognitive status: Within functional limits for tasks assessed    MUSCLE LENGTH: Hamstrings: Right 140 deg; Left 130 deg   POSTURE:  rounded shoulders, forward head, decreased lumbar lordosis, and increased thoracic kyphosis    Trunk ROM: extension: 12 degrees with reps increasing pain                      Flexion:  fingers just below knees with increased pain.     LOWER EXTREMITY  MMT:  MMT Right eval Right 04/17/23 Left eval Left 04/17/23  Hip flexion 5 5 2+ 4  Hip extension 2 2+ 2 3-  Hip abduction 5 5 3 4   Hip adduction      Hip internal rotation      Hip external rotation      Knee flexion 3+ 5 3+ 5  Knee extension 5 5 4 5   Ankle dorsiflexion 4 5 4 5   Ankle plantarflexion      Ankle inversion      Ankle eversion       (Blank rows = not tested)   FUNCTIONAL TESTS: 03/13/23 30 seconds chair stand test: 7; 8 is poor for pt age and sex 04/17/23 8 x in 30 seconds, poor for age and sex  2 minute walk test: 294 with significant increased pain.   04/17/23:  378 ft in two minutes tired but no pain. Single leg stance:  Rt:  11"; Lt: 20"  TODAY'S TREATMENT:                                                                                                                              DATE:  04/29/23 Heelraises 20X Lunging bil 20X each no UE assist Marching 1 UE assist 20X Squatting 20X Side squat side stepping 1RT at 20 ft line Sit to stands 15X no UE Thoracic excursion over chair back 5X  Leg press 4 Pl x 10 2 sets  Rows on body craft 2 pl x 10 2 sets  Bike 5 minutes level 3 4 minutes  04/24/23 Lunging both Rt and Lt x 10 Sit to stand x 15 Heel raise x 15  Single leg stance x 5 B  Sitting lumbar stretch using green ball straight, to the Right and then to the left x 5 each  Thoracic excursion x 3  Marching one hand hold holding for 5 seconds each x 10  Football squat  side stepping x 2 RT  Leg press 4 Pl x 10 2 sets  Rows on body craft 2 pl x 5 2 reps each   04/22/23 Nustep hills 3, level 3 x 5 minutes. Standing: Functional squat 2x10   Paloff GTB 10X2 each direction  7" forward step ups 10X each with 1 UE assist  7" lateral step ups 10X each with 1 UE assist Seated:  Sit to stand  2x10 Leg press 4pl  2x10   04/17/23:re-assessment see above  Sit to stand x 10 Functional squat x 10  Bridge x 10  Leg press 4pl x 15  Nustep hills 3, level 3 x 7  minutes.   04/09/23: Theraband Green Scapular retraction x20-HEP Rows x15-HEP Extension  x 15-HEP Pallof x 10 Squat to heel raise at counter x 10  Side lying :  Clam x 10 B Hip abduction x 10  Supine Bridge x 15  Knee to chest x 3   04/07/23 Theraband: blue Scapular retraction x15 Rows x15 Extension  x 15 Pallof x 10 Squat with L of arms to Rt then Lt equals 1 x 5 Marching x 15  Wall plank x 30" x 2  Sitting  Thoracic extension x 15  Sitting tall with glut and abdominal set x 10  Sit to stand x 10  Standing excursions x 3  Leg press 4 Pls x 10  Supine: Bridge x 10  Knee to chest x 3 Pelvic tilt x 10 LTR x 5   04/02/2023 Standing : Blue theraband: Scapular retraction x 10 Rows x 10 Shoulder extension x 10  Functional squat x 10  Marching x 10 Sitting:  Sitting tall x 10  Sit to stand x 10  Glut set with ab set combination x 10 Supine: Trunk rotation x 10  Pelvic tilt x 10 Bridge x 10  Knee to chest x 3 for 30" each with towel  PATIENT EDUCATION:  Education details: HPE Person educated: Patient Education method: Explanation Education comprehension: returned demonstration  HOME EXERCISE PROGRAM: Access Code: [email or web address] URL: [email or web address] Date: 04/09/23 Postural exercises green for scapular retraction, row and extension  Date: 03/24/23  Supine Bridge  - 1 x daily - 7 x weekly - 1 sets - 10 reps - 3-5" hold - Sidelying Hip Abduction  - 1 x daily - 7 x weekly - 1 sets - 10 reps - 3-5" hold - Clamshell  - 1 x daily - 7 x weekly - 1 sets - 10 reps - 3-5" hold - Heel Raises with Counter Support  - 1 x daily - 7 x weekly - 1 sets - 10 reps - 3-5 hold - Mini Squat  - 1 x daily - 7 x weekly - 1 sets - 10 reps - 3-5 hold - Seated Transversus Abdominis Bracing  - 10 x daily - 7 x weekly - 1 sets - 10 reps - 3-5" hold  Date: 03/13/2023 Prepared by: Virgina Organ Exercises - Supine Single Knee to Chest Stretch  - 2 x daily - 7 x weekly - 3  sets - 3 reps - 30" hold - Supine Double Knee to Chest  - 2 x daily - 7 x weekly - 3 sets - 3 reps - 30" hold  ASSESSMENT:  CLINICAL IMPRESSION:  focus remains on LE strengthening.  Able to increase reps of most exercises this session.  Pt remains compliant with her HEP and is going to [facility].  Pt with  some difficulty getting on/off row machine, however no pain or issues with any of the therex completed. No pain or issues voiced today.  Pt will likely be ready for discharge next session pending goals and functional measures.   OBJECTIVE IMPAIRMENTS: .   Abnormal gait, cardiopulmonary status limiting activity, decreased activity tolerance, decreased endurance, decreased mobility, difficulty walking, decreased ROM, decreased strength, hypomobility, increased fascial restrictions, impaired perceived functional ability, increased muscle spasms, impaired flexibility, impaired sensation, and pain.   ACTIVITY LIMITATIONS:  carrying, lifting, bending, sitting, standing, squatting, sleeping, stairs, transfers, bed mobility, continence, bathing, locomotion level, and caring for others   PARTICIPATION LIMITATIONS:  carrying, lifting, bending, sitting, standing, squatting, sleeping, stairs, transfers, bed mobility, continence, bathing, locomotion level, and caring for others   PERSONAL FACTORS: Time since onset of injury/illness/exacerbation are also affecting patient's functional outcome.   REHAB POTENTIAL: Fair    CLINICAL DECISION MAKING: Evolving/moderate complexity  EVALUATION COMPLEXITY: Moderate   GOALS: Goals reviewed with patient? No  SHORT TERM GOALS: Target date: 04/03/23 Pt to be I in HEP in order to decrease her pain to no greater than a 7/10 Baseline: Goal status: MET  2.  Pt to be able to stand for 15 minutes without increased pain  Baseline:  Goal status: MET  3.  Pt to be able to walk for 15 minutes without increased pain  Baseline:  Goal status: IN  PROGRESS    LONG TERM GOALS: Target date: 04/24/23  Pt to be I in an advanced HEP in order to decrease her pain to no greater than a 4/10 Baseline:  Goal status: MET  2.   Pt to be able to stand for 25 minutes without increased pain to make a meal  Baseline:  Goal status: MET  3.  Pt to be able to walk for 30 minutes without increased pain to allow pt to grocery shop in comfort.  Baseline:  Goal status: IN PROGRESS  4.  PT mm strength to have increased 1 grade to be able to come from sit to stand with ease.  Baseline:  Goal status: IN PROGRESS     PLAN:  PT FREQUENCY: 2x/week  PT DURATION: 6 weeks   PLANNED INTERVENTIONS: Therapeutic exercises, Therapeutic activity, Neuromuscular re-education, Balance training, Gait training, Patient/Family education, Self Care, and Joint mobilization   PLAN FOR NEXT SESSION:Progress lumbar stabilization program, balance as well as gluteal strength.  Re-eval/discharge next session   Lurena Nida, PTA/CLT [facility] Ph: [phone]   04/29/2023, 2:56 PM

## 2023-05-01 ENCOUNTER — Ambulatory Visit (HOSPITAL_COMMUNITY): Payer: Medicare Other | Admitting: Physical Therapy

## 2023-05-01 DIAGNOSIS — R29898 Other symptoms and signs involving the musculoskeletal system: Secondary | ICD-10-CM

## 2023-05-01 DIAGNOSIS — M545 Low back pain, unspecified: Secondary | ICD-10-CM

## 2023-05-01 DIAGNOSIS — M6281 Muscle weakness (generalized): Secondary | ICD-10-CM

## 2023-05-01 NOTE — Therapy (Addendum)
OUTPATIENT PHYSICAL THERAPY TREATMENT/Discharge   Patient Name: Maria Fry MRN: [id number] DOB:09/12/1951, 72 y.o., female Today's Date: 05/01/2023 PHYSICAL THERAPY DISCHARGE SUMMARY  Visits from Start of Care: 13  Current functional level related to goals / functional outcomes: See below   Remaining deficits: See below   Education / Equipment: HEP   Patient agrees to discharge. Patient goals were met. Patient is being discharged due to being pleased with the current functional level.   END OF SESSION:   PT End of Session - 05/01/23 1341     Visit Number 13    Number of Visits 13    Date for PT Re-Evaluation 05/17/23    Authorization Type [employer] medicare    Authorization - Number of Visits 13    Progress Note Due on Visit 19    PT Start Time 1305    PT Stop Time 1335    PT Time Calculation (min) 30 min    Activity Tolerance Patient limited by fatigue    Behavior During Therapy [facility] for tasks assessed/performed                 Past Medical History:  Diagnosis Date   Arthritis    Right knee   Atrial fibrillation (HCC)    CAD (coronary artery disease)    a. 06/07/15 NSTEMI/Cath: Dist LAD 100%, mid LAD 10%. Otw nl cors. Nl EF w/ inferoapical AK.   Essential hypertension    Hip pain    History of cardiomyopathy 04/23/2016   a. 04/2016 Echo: EF 45%, Gr2 DD; b. 11/2019 Echo: EF 45-50%, mod LVH. Gr2 DD. Nl RV fxn. Sev dil LA.    Hyperlipidemia    Hypothyroidism    NSTEMI (non-ST elevated myocardial infarction) (HCC) 06/05/2015   Vitamin D deficiency    Past Surgical History:  Procedure Laterality Date   ABDOMINAL HYSTERECTOMY     partial- one ovary remaining   BIOPSY  09/17/2020   Procedure: BIOPSY;  Surgeon: Lanelle Bal, DO;  [facility];  Service: Endoscopy;;   CARDIAC CATHETERIZATION N/A 06/06/2015   Procedure: Left Heart Cath and Coronary Angiography;  Surgeon: Peter M Swaziland, MD;  Location: [facility] INVASIVE CV LAB;  Service: Cardiovascular;   Laterality: N/A;   CARDIOVERSION N/A 09/14/2020   Procedure: CARDIOVERSION;  Surgeon: Jonelle Sidle, MD;  Location: [facility];  Service: Cardiovascular;  Laterality: N/A;   CARDIOVERSION N/A 06/20/2022   Procedure: CARDIOVERSION;  Surgeon: Antoine Poche, MD;  Location: [facility] ORS;  Service: Endoscopy;  Laterality: N/A;   CARDIOVERSION N/A 01/01/2023   Procedure: CARDIOVERSION;  Surgeon: Jodelle Red, MD;  Location: [facility] ENDOSCOPY;  Service: Cardiovascular;  Laterality: N/A;   CHOLECYSTECTOMY     COLONOSCOPY     COLONOSCOPY N/A 08/13/2017   Procedure: COLONOSCOPY;  Surgeon: Corbin Ade, MD;  Location: [facility];  Service: Endoscopy;  Laterality: N/A;  8:30 AM   COLONOSCOPY WITH PROPOFOL N/A 01/18/2021   [name]: Multiple tubular adenomas removed, next colonoscopy in 3 years   ESOPHAGOGASTRODUODENOSCOPY N/A 09/11/2016   Procedure: ESOPHAGOGASTRODUODENOSCOPY (EGD);  Surgeon: Corbin Ade, MD;  [facility];  Service: Endoscopy;  Laterality: N/A;  7:45 am - moved to 10/11 @ 10:30   ESOPHAGOGASTRODUODENOSCOPY (EGD) WITH PROPOFOL N/A 09/17/2020   [name]: Diffuse moderate inflammation characterized by erosions and erythema found in the entire stomach.  Biopsies positive for H. pylori.  Patient has not been treated due to drug allergies.   Heel spurs Left  MALONEY DILATION N/A 09/11/2016   Procedure: [name] DILATION;  Surgeon: Corbin Ade, MD;  [facility];  Service: Endoscopy;  Laterality: N/A;   POLYPECTOMY  08/13/2017   Procedure: POLYPECTOMY;  Surgeon: Corbin Ade, MD;  [facility];  Service: Endoscopy;;  colon   POLYPECTOMY  01/18/2021   Procedure: POLYPECTOMY;  Surgeon: Corbin Ade, MD;  [facility];  Service: Endoscopy;;   SHOULDER ARTHROSCOPY Right    TUBAL LIGATION     Patient Active Problem List   Diagnosis Date Noted   Meralgia paresthetica 11/20/2022   Lumbar back pain 09/25/2022   Encounter for  general adult medical examination with abnormal findings 09/25/2022   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HCC) 07/01/2022   Elevated brain natriuretic peptide (BNP) level 06/19/2022   Insomnia 06/19/2022   Chest pain 06/18/2022   Hypomagnesemia 09/05/2021   Wheezing 03/06/2021   Sleep apnea 02/06/2021   Vitamin D insufficiency 01/30/2021   Encounter to establish care 01/30/2021   Helicobacter pylori gastritis 11/01/2020   H/O adenomatous polyp of colon 10/31/2020   Constipation 10/15/2020   History of GI bleed 10/15/2020   Persistent atrial fibrillation (HCC) 10/10/2020   Hypercoagulable state due to persistent atrial fibrillation (HCC) 10/05/2020   Obesity, Class III, BMI 40-49.9 (morbid obesity) (HCC)    Gastroesophageal reflux disease    Gastritis    Acquired hypothyroidism    Melena    CHF exacerbation (HCC) 09/15/2020   Atrial fibrillation, chronic (HCC) 08/23/2020   Stable angina 01/14/2020   Abnormal nuclear stress test 12/08/2019   Iron deficiency anemia 11/28/2019   Mixed hyperlipidemia 11/27/2019   OA (osteoarthritis) of knee 05/18/2019   DDD (degenerative disc disease), lumbar 05/18/2019   Obesity 12/26/2017   Seasonal allergies 08/19/2016   Glucose intolerance (impaired glucose tolerance) 05/14/2016   Chronic hip pain 05/14/2016   Chronic combined systolic and diastolic heart failure (HCC) 05/14/2016   Hypothyroidism following radioiodine therapy 11/30/2015   CAD (coronary artery disease)    NSTEMI (non-ST elevated myocardial infarction) (HCC)    Essential hypertension 06/05/2015    PCP: Christel Mormon  REFERRING PROVIDER: Suanne Marker, MD  REFERRING DIAG: M54.42,M54.41,G89.29 (ICD-10-CM) - Chronic bilateral low back pain with bilateral sciatica M48.062 (ICD-10-CM) - Spinal stenosis of lumbar region with neurogenic claudication  THERAPY DIAG:  No diagnosis found.  Rationale for Evaluation and Treatment: Rehabilitation  ONSET DATE: 08/17/2023 acute  exacerbation    SUBJECTIVE STATEMENT: Pt reports no pain or issues today.  States she is ready to be discharged and will continue HEP and going to [facility].  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 Are you having pain? Yes: 0 Pain location: pain in low back and left side Pain description: aching, sore Aggravating factors: mornings, prolonged stand or walk Relieving factors: sit in recliner and don't move PRECAUTIONS: None  WEIGHT BEARING RESTRICTIONS: No  FALLS:  Has patient fallen in last 6 months? No  LIVING ENVIRONMENT: Lives with: lives with their family Lives in: Mobile home Stairs: Yes: External: 5 steps; can reach both Has following equipment at home: Single point cane and Wheelchair (manual)  OCCUPATION: retired   PLOF: Independent  PATIENT GOALS: less pain and to be able to stand and walk longer.  NEXT MD VISIT: not scheduled   OBJECTIVE:   DIAGNOSTIC FINDINGS:  Narrative & Impression  CLINICAL DATA:  Chronic bilateral low back pain  with bilateral sciatica     COMPARISON:  No prior MRI of the lumbar spine is available, an MRI IMPRESSION: 1. L4-L5 mild bilateral neural foraminal narrowing. Narrowing of the lateral recesses at this level could affect the descending L5 nerve roots. 2. Narrowing of the right lateral recess at L2-L3, which could affect the descending right L3 nerve roots. 3. Multilevel facet arthropathy, which is moderate at L1-L2 and L2-L3 bilaterally and on the right at L4-L5 and L5-S1, which can be a cause of back pain. In addition, medially directed facet synovial cysts are noted on the right at L1-L2 and L2-L3, indenting on the posterior aspect of the thecal sac, but not causing significant spinal canal stenosis.     Electronically Signed   By: Wiliam Ke M.D.   On: 02/14/2023 13:31      PATIENT SURVEYS:  FOTO 48 04/17/23:   67% 05/01/23: 62%  COGNITION: Overall cognitive status: Within functional limits for tasks assessed    MUSCLE LENGTH: Hamstrings: Right 140 deg; Left 130 deg   POSTURE:  rounded shoulders, forward head, decreased lumbar lordosis, and increased thoracic kyphosis    Trunk ROM: extension: 12 degrees with reps increasing pain                      Flexion:  fingers just below knees with increased pain.     LOWER EXTREMITY MMT:  MMT Right eval Right 04/17/23 Right 05/01/23 Left eval Left 04/17/23 Left 05/01/23  Hip flexion 5 5 5  2+ 4 4+  Hip extension 2 2+ 3+ 2 3- 3+  Hip abduction 5 5 5 3 4 5   Hip adduction        Hip internal rotation        Hip external rotation        Knee flexion 3+ 5 5 3+ 5 5  Knee extension 5 5 5 4 5 5   Ankle dorsiflexion 4 5 5 4 5 5   Ankle plantarflexion        Ankle inversion        Ankle eversion         (Blank rows = not tested)   FUNCTIONAL TESTS: 03/13/23 30 seconds chair stand test: 8 is poor for pt age and sex  03/13/23:  7X in 30 seconds 04/17/23: 8 x in 30 seconds  05/01/23: 12X in 30 seconds 2 minute walk test:  03/13/23: 294 with significant increased pain.   04/17/23:  378 ft in two minutes tired but no pain. 05/01/23:  not tested Single leg stance:   5/156/24: Rt:  11"; Lt: 20" 05/01/23:  Rt 15", Lt 20"   TODAY'S TREATMENT:                                                                                                                              DATE:  05/01/23: Functional tests see above MMT see above Goal discussion HEP discussion  04/29/23 Heelraises 20X Lunging bil 20X each no UE assist Marching 1 UE assist 20X Squatting 20X Side squat side stepping 1RT at 20 ft line Sit to stands 15X no UE Thoracic excursion over chair back 5X  Leg press 4 Pl x 10 2 sets  Rows on body craft 2 pl x 10 2 sets  Bike 5 minutes level 3 4 minutes  04/24/23 Lunging both Rt and Lt x 10 Sit to stand x 15 Heel raise x 15  Single leg stance  x 5 B  Sitting lumbar stretch using green ball straight, to the Right and then to the left x 5 each  Thoracic excursion x 3  Marching one hand hold holding for 5 seconds each x 10  Football squat side stepping x 2 RT  Leg press 4 Pl x 10 2 sets  Rows on body craft 2 pl x 5 2 reps each   04/22/23 Nustep hills 3, level 3 x 5 minutes. Standing: Functional squat 2x10   Paloff GTB 10X2 each direction  7" forward step ups 10X each with 1 UE assist  7" lateral step ups 10X each with 1 UE assist Seated:  Sit to stand  2x10 Leg press 4pl  2x10   04/17/23:re-assessment see above  Sit to stand x 10 Functional squat x 10  Bridge x 10  Leg press 4pl x 15  Nustep hills 3, level 3 x 7 minutes.   04/09/23: Theraband Green Scapular retraction x20-HEP Rows x15-HEP Extension  x 15-HEP Pallof x 10 Squat to heel raise at counter x 10  Side lying :  Clam x 10 B Hip abduction x 10  Supine Bridge x 15  Knee to chest x 3   04/07/23 Theraband: blue Scapular retraction x15 Rows x15 Extension  x 15 Pallof x 10 Squat with L of arms to Rt then Lt equals 1 x 5 Marching x 15  Wall plank x 30" x 2  Sitting  Thoracic extension x 15  Sitting tall with glut and abdominal set x 10  Sit to stand x 10  Standing excursions x 3  Leg press 4 Pls x 10  Supine: Bridge x 10  Knee to chest x 3 Pelvic tilt x 10 LTR x 5   04/02/2023 Standing : Blue theraband: Scapular retraction x 10 Rows x 10 Shoulder extension x 10  Functional squat x 10  Marching x 10 Sitting:  Sitting tall x 10  Sit to stand x 10  Glut set with ab set combination x 10 Supine: Trunk rotation x 10  Pelvic tilt x 10 Bridge x 10  Knee to chest x 3 for 30" each with towel  PATIENT EDUCATION:  Education details: HPE Person educated: Patient Education method: Explanation Education comprehension: returned demonstration  HOME EXERCISE PROGRAM: Access Code: [email or web address] URL: [email or web address] Date:  04/09/23 Postural exercises green for scapular retraction, row and extension  Date: 03/24/23  Supine Bridge  - 1 x daily - 7 x weekly - 1 sets - 10 reps - 3-5" hold - Sidelying Hip Abduction  - 1 x daily - 7 x weekly - 1 sets - 10 reps - 3-5" hold - Clamshell  - 1 x daily - 7 x weekly - 1 sets - 10 reps - 3-5" hold - Heel Raises with Counter Support  - 1 x daily - 7 x weekly - 1 sets - 10 reps - 3-5 hold - Mini Squat  - 1 x daily - 7 x  weekly - 1 sets - 10 reps - 3-5 hold - Seated Transversus Abdominis Bracing  - 10 x daily - 7 x weekly - 1 sets - 10 reps - 3-5" hold  Date: 03/13/2023 Prepared by: Virgina Organ Exercises - Supine Single Knee to Chest Stretch  - 2 x daily - 7 x weekly - 3 sets - 3 reps - 30" hold - Supine Double Knee to Chest  - 2 x daily - 7 x weekly - 3 sets - 3 reps - 30" hold  ASSESSMENT:  CLINICAL IMPRESSION:  final test measures collected with improvements in all areas.  FOTO score was actually lower this session, however improved from baseline.  Pt has met all goals for therapy and is now ready for discharge.  No further questions or skilled needs.   OBJECTIVE IMPAIRMENTS: .   Abnormal gait, cardiopulmonary status limiting activity, decreased activity tolerance, decreased endurance, decreased mobility, difficulty walking, decreased ROM, decreased strength, hypomobility, increased fascial restrictions, impaired perceived functional ability, increased muscle spasms, impaired flexibility, impaired sensation, and pain.   ACTIVITY LIMITATIONS:  carrying, lifting, bending, sitting, standing, squatting, sleeping, stairs, transfers, bed mobility, continence, bathing, locomotion level, and caring for others   PARTICIPATION LIMITATIONS:  carrying, lifting, bending, sitting, standing, squatting, sleeping, stairs, transfers, bed mobility, continence, bathing, locomotion level, and caring for others   PERSONAL FACTORS: Time since onset of injury/illness/exacerbation are also  affecting patient's functional outcome.   REHAB POTENTIAL: Fair    CLINICAL DECISION MAKING: Evolving/moderate complexity  EVALUATION COMPLEXITY: Moderate   GOALS: Goals reviewed with patient? No  SHORT TERM GOALS: Target date: 04/03/23 Pt to be I in HEP in order to decrease her pain to no greater than a 7/10 Baseline: Goal status: MET  2.  Pt to be able to stand for 15 minutes without increased pain  Baseline:  Goal status: MET  3.  Pt to be able to walk for 15 minutes without increased pain  Baseline:  Goal status: MET    LONG TERM GOALS: Target date: 04/24/23  Pt to be I in an advanced HEP in order to decrease her pain to no greater than a 4/10 Baseline:  Goal status: MET  2.   Pt to be able to stand for 25 minutes without increased pain to make a meal  Baseline:  Goal status: MET  3.  Pt to be able to walk for 30 minutes without increased pain to allow pt to grocery shop in comfort.  Baseline: 5/30: 15 minute max Goal status: MET  4.  PT mm strength to have increased 1 grade to be able to come from sit to stand with ease.  Baseline:  Goal status: MET     PLAN:  PT FREQUENCY: 2x/week  PT DURATION: 6 weeks   PLANNED INTERVENTIONS: Therapeutic exercises, Therapeutic activity, Neuromuscular re-education, Balance training, Gait training, Patient/Family education, Self Care, and Joint mobilization   PLAN FOR NEXT SESSION: discharge  Lurena Nida, PTA/CLT [facility] Ph: [phone]  Virgina Organ, PT CLT [phone]  05/01/2023, 1:44 PM

## 2023-05-05 ENCOUNTER — Other Ambulatory Visit: Payer: Self-pay | Admitting: Nurse Practitioner

## 2023-05-05 DIAGNOSIS — E785 Hyperlipidemia, unspecified: Secondary | ICD-10-CM

## 2023-05-09 ENCOUNTER — Encounter: Payer: Self-pay | Admitting: Cardiology

## 2023-05-09 ENCOUNTER — Ambulatory Visit: Payer: Medicare Other | Attending: Cardiology | Admitting: Cardiology

## 2023-05-09 VITALS — BP 150/88 | HR 59 | Ht 62.0 in | Wt 217.0 lb

## 2023-05-09 DIAGNOSIS — I5032 Chronic diastolic (congestive) heart failure: Secondary | ICD-10-CM

## 2023-05-09 DIAGNOSIS — I2510 Atherosclerotic heart disease of native coronary artery without angina pectoris: Secondary | ICD-10-CM | POA: Diagnosis not present

## 2023-05-09 DIAGNOSIS — I480 Paroxysmal atrial fibrillation: Secondary | ICD-10-CM | POA: Diagnosis not present

## 2023-05-09 DIAGNOSIS — Z6839 Body mass index (BMI) 39.0-39.9, adult: Secondary | ICD-10-CM | POA: Diagnosis not present

## 2023-05-09 MED ORDER — WEGOVY 0.5 MG/0.5ML ~~LOC~~ SOAJ: 0.5000 mg | SUBCUTANEOUS | 0 refills | Status: DC

## 2023-05-09 MED ORDER — WEGOVY 0.25 MG/0.5ML ~~LOC~~ SOAJ: 0.2500 mg | SUBCUTANEOUS | 0 refills | Status: DC

## 2023-05-09 NOTE — Patient Instructions (Signed)
Medication Instructions:  Take Wegovy 0.25 mg subcutaneously weekly for 4 weeks  STARTING 06/06/23 increase dose to 0.5 mg subcutaneously for 4 weeks   Call office on 06/27/23 and we will order your next dose  Labwork: None today  Testing/Procedures: None today  Follow-Up: 6 months  Any Other Special Instructions Will Be Listed Below (If Applicable).  If you need a refill on your cardiac medications before your next appointment, please call your pharmacy.

## 2023-05-09 NOTE — Progress Notes (Signed)
Cardiology Office Note  Date: 05/09/2023   ID: JAYLEN APPLER, DOB 11/12/1951, MRN [id number]  History of Present Illness: Maria Fry is a 72 y.o. female last seen in November 2023.  Since I last saw her she was taken off Tikosyn and transition to amiodarone with cardioversion earlier this year.  She does not report any palpitations in the interim and indicates compliance with her medications.  She has had trouble with back pain, underwent physical therapy and is have a hard time exercising.  BMI is 39.7.  From a cardiovascular standpoint she does not describe any active angina and continues on Lipitor with last LDL 45 in January.  She does have follow-up scheduled with EP in [place], there have been some discussions about possible candidacy for atrial fibrillation ablation.  She is trying to lose weight, but this has been limited.  Physical Exam: VS:  BP (!) 150/88   Pulse (!) 59   Ht 5\' 2"  (1.575 m)   Wt 217 lb (98.4 kg)   SpO2 98%   BMI 39.69 kg/m , BMI Body mass index is 39.69 kg/m.  Wt Readings from Last 3 Encounters:  05/09/23 217 lb (98.4 kg)  02/17/23 213 lb 12.8 oz (97 kg)  02/14/23 212 lb 12.8 oz (96.5 kg)    General: Patient appears comfortable at rest. HEENT: Conjunctiva and lids normal. Neck: Supple, no elevated JVP or carotid bruits. Lungs: Clear to auscultation, nonlabored breathing at rest. Cardiac: Regular rate and rhythm, no S3 or significant systolic murmur. Extremities: No pitting edema.  ECG:  An ECG dated 01/24/2023 was personally reviewed today and demonstrated:  Sinus bradycardia with prolonged PR interval, nonspecific ST-T changes.  Labwork: 06/18/2022: B Natriuretic Peptide 305.0 07/17/2022: ALT 11; AST 15 11/15/2022: Magnesium 2.2 12/18/2022: TSH 4.050 01/01/2023: BUN 28; Creatinine, Ser 1.30; Potassium 4.4; Sodium 138 02/27/2023: Hemoglobin 7.6; Platelets 404     Component Value Date/Time   CHOL 96 (L) 12/18/2022 1038   TRIG 71 12/18/2022  1038   HDL 36 (L) 12/18/2022 1038   CHOLHDL 2.7 12/18/2022 1038   CHOLHDL 2.7 01/03/2020 0948   VLDL 12 06/23/2017 0902   LDLCALC 45 12/18/2022 1038   LDLCALC 63 01/03/2020 0948   Other Studies Reviewed Today:  Echocardiogram 06/19/2022:  1. Left ventricular ejection fraction, by estimation, is 55%. The left  ventricle has normal function. Left ventricular endocardial border not  optimally defined to evaluate regional wall motion. There is severe left  ventricular hypertrophy.   2. Right ventricular systolic function is normal. The right ventricular  size is normal.   3. The aortic valve is tricuspid. There is mild calcification of the  aortic valve. There is mild thickening of the aortic valve.   4. IVC is small suggesting low RA pressure and hypovolemia.   5. Limited echo to evaluate LV function   Assessment and Plan:  1.  CAD status post NSTEMI in 2016 with occlusion of the distal LAD managed medically.  She does not describe any active angina at this time, no nitroglycerin use.  Continue observation on Lipitor.  LDL was 45 in January.  2.  Paroxysmal atrial fibrillation with CHA2DS2-VASc score of 5.  Now on amiodarone and status post cardioversion earlier this year.  She is maintaining sinus rhythm with no progressive palpitations.  Continue Eliquis for stroke prophylaxis and keep follow-up with the EP for further discussions regarding potential for atrial fibrillation ablation.  3.  HFpEF, LVEF approximately 55% by echocardiogram in  July 2023.  She is on Lasix with potassium supplement.  4.  BMI 39.7.  In light of cardiovascular disease and HFpEF, we did discuss other pharmacologic options that would also help with weight loss and reduce her risk.  Plan to initiate [facility].  Disposition:  Follow up  6 months.  Signed, Jonelle Sidle, M.D., F.A.C.C. [facility]

## 2023-05-12 ENCOUNTER — Other Ambulatory Visit (HOSPITAL_COMMUNITY): Payer: Self-pay

## 2023-05-16 ENCOUNTER — Encounter: Payer: Self-pay | Admitting: Internal Medicine

## 2023-05-16 ENCOUNTER — Ambulatory Visit (INDEPENDENT_AMBULATORY_CARE_PROVIDER_SITE_OTHER): Payer: Medicare Other | Admitting: Internal Medicine

## 2023-05-16 VITALS — BP 127/72 | HR 61 | Ht 63.0 in | Wt 215.4 lb

## 2023-05-16 DIAGNOSIS — I10 Essential (primary) hypertension: Secondary | ICD-10-CM

## 2023-05-16 DIAGNOSIS — Z1231 Encounter for screening mammogram for malignant neoplasm of breast: Secondary | ICD-10-CM | POA: Insufficient documentation

## 2023-05-16 DIAGNOSIS — I4819 Other persistent atrial fibrillation: Secondary | ICD-10-CM

## 2023-05-16 DIAGNOSIS — E66812 Obesity, class 2: Secondary | ICD-10-CM

## 2023-05-16 DIAGNOSIS — Z6835 Body mass index (BMI) 35.0-35.9, adult: Secondary | ICD-10-CM

## 2023-05-16 DIAGNOSIS — M545 Low back pain, unspecified: Secondary | ICD-10-CM

## 2023-05-16 DIAGNOSIS — E559 Vitamin D deficiency, unspecified: Secondary | ICD-10-CM

## 2023-05-16 DIAGNOSIS — D508 Other iron deficiency anemias: Secondary | ICD-10-CM

## 2023-05-16 DIAGNOSIS — E669 Obesity, unspecified: Secondary | ICD-10-CM

## 2023-05-16 NOTE — Progress Notes (Signed)
Established Patient Office Visit  Subjective   Patient ID: Maria Fry, female    DOB: 1951/08/09  Age: 72 y.o. MRN: [id number]  Chief Complaint  Patient presents with   Follow-up    3 month   Maria Fry returns to care today for routine follow-up.  She was last evaluated by me on 3/15.  No medication changes were made at that time and 72-month follow-up was arranged.  In the interim she has been seen by pulmonology and cardiology.  She has also completed physical therapy for lumbar back pain.  There have otherwise been no acute interval events.  Maria Fry reports feeling well today.  She is asymptomatic and has no acute concerns or discuss.  Past Medical History:  Diagnosis Date   Arthritis    Right knee   Atrial fibrillation (HCC)    CAD (coronary artery disease)    a. 06/07/15 NSTEMI/Cath: Dist LAD 100%, mid LAD 10%. Otw nl cors. Nl EF w/ inferoapical AK.   Essential hypertension    Hip pain    History of cardiomyopathy 04/23/2016   a. 04/2016 Echo: EF 45%, Gr2 DD; b. 11/2019 Echo: EF 45-50%, mod LVH. Gr2 DD. Nl RV fxn. Sev dil LA.    Hyperlipidemia    Hypothyroidism    NSTEMI (non-ST elevated myocardial infarction) (HCC) 06/05/2015   Vitamin D deficiency    Past Surgical History:  Procedure Laterality Date   ABDOMINAL HYSTERECTOMY     partial- one ovary remaining   BIOPSY  09/17/2020   Procedure: BIOPSY;  Surgeon: Lanelle Bal, DO;  [facility]: [facility];  Service: Endoscopy;;   CARDIAC CATHETERIZATION N/A 06/06/2015   Procedure: Left Heart Cath and Coronary Angiography;  Surgeon: Peter M Swaziland, MD;  Location: [facility] INVASIVE CV LAB;  Service: Cardiovascular;  Laterality: N/A;   CARDIOVERSION N/A 09/14/2020   Procedure: CARDIOVERSION;  Surgeon: Jonelle Sidle, MD;  Location: [facility];  Service: Cardiovascular;  Laterality: N/A;   CARDIOVERSION N/A 06/20/2022   Procedure: CARDIOVERSION;  Surgeon: Antoine Poche, MD;  Location: [facility];  Service: Endoscopy;   Laterality: N/A;   CARDIOVERSION N/A 01/01/2023   Procedure: CARDIOVERSION;  Surgeon: Jodelle Red, MD;  Location: [facility] ENDOSCOPY;  Service: Cardiovascular;  Laterality: N/A;   CHOLECYSTECTOMY     COLONOSCOPY     COLONOSCOPY N/A 08/13/2017   Procedure: COLONOSCOPY;  Surgeon: Corbin Ade, MD;  Location: [facility];  Service: Endoscopy;  Laterality: N/A;  8:30 AM   COLONOSCOPY WITH PROPOFOL N/A 01/18/2021   [name]: Multiple tubular adenomas removed, next colonoscopy in 3 years   ESOPHAGOGASTRODUODENOSCOPY N/A 09/11/2016   Procedure: ESOPHAGOGASTRODUODENOSCOPY (EGD);  Surgeon: Corbin Ade, MD;  Location: [facility];  Service: Endoscopy;  Laterality: N/A;  7:45 am - moved to 10/11 @ 10:30   ESOPHAGOGASTRODUODENOSCOPY (EGD) WITH PROPOFOL N/A 09/17/2020   [name]: Diffuse moderate inflammation characterized by erosions and erythema found in the entire stomach.  Biopsies positive for H. pylori.  Patient has not been treated due to drug allergies.   Heel spurs Left    MALONEY DILATION N/A 09/11/2016   Procedure: [name] DILATION;  Surgeon: Corbin Ade, MD;  Location: [facility];  Service: Endoscopy;  Laterality: N/A;   POLYPECTOMY  08/13/2017   Procedure: POLYPECTOMY;  Surgeon: Corbin Ade, MD;  Location: [facility];  Service: Endoscopy;;  colon   POLYPECTOMY  01/18/2021   Procedure: POLYPECTOMY;  Surgeon: Corbin Ade, MD;  Location: [facility];  Service: Endoscopy;;   SHOULDER ARTHROSCOPY Right    TUBAL LIGATION     Social History   Tobacco Use   Smoking status: Former    Packs/day: 1.00    Years: 20.00    Additional pack years: 0.00    Total pack years: 20.00    Types: Cigarettes    Start date: 12/02/1977    Quit date: 12/02/2002    Years since quitting: 20.4   Smokeless tobacco: Never   Tobacco comments:    Former smoker 05/10/22  Vaping Use   Vaping Use: Never used  Substance Use Topics   Alcohol use: No    Alcohol/week: 0.0 standard drinks of  alcohol   Drug use: No   Family History  Problem Relation Age of Onset   Heart failure Father        Deceased   Heart attack Father        Deceased   Stroke Sister        Deceased   Heart failure Brother        Deceased   Cancer Brother        Deceased   Heart attack Brother        Deceased   COPD Daughter    Arthritis Daughter    Lupus Daughter    Colon cancer Neg Hx    Allergies  Allergen Reactions   Afrin [Oxymetazoline] Anaphylaxis   Amoxicillin Anaphylaxis   Flonase [Fluticasone] Anaphylaxis   Lisinopril Anaphylaxis   Ranolazine Anaphylaxis    Throat swells, Dizzy, Reaction occurred with generic med   Vicodin [Hydrocodone-Acetaminophen] Diarrhea and Nausea And Vomiting   Demerol [Meperidine] Nausea And Vomiting   Lodine [Etodolac] Rash   Review of Systems  Constitutional:  Negative for chills and fever.  HENT:  Negative for sore throat.   Respiratory:  Negative for cough and shortness of breath.   Cardiovascular:  Negative for chest pain, palpitations and leg swelling.  Gastrointestinal:  Negative for abdominal pain, blood in stool, constipation, diarrhea, nausea and vomiting.  Genitourinary:  Negative for dysuria and hematuria.  Musculoskeletal:  Negative for myalgias.  Skin:  Negative for itching and rash.  Neurological:  Negative for dizziness and headaches.  Psychiatric/Behavioral:  Negative for depression and suicidal ideas.      Objective:     BP 127/72   Pulse 61   Ht 5\' 3"  (1.6 m)   Wt 215 lb 6.4 oz (97.7 kg)   SpO2 94%   BMI 38.16 kg/m  BP Readings from Last 3 Encounters:  05/16/23 127/72  05/09/23 (!) 150/88  02/17/23 130/72   Physical Exam Vitals reviewed.  Constitutional:      General: She is not in acute distress.    Appearance: Normal appearance. She is obese. She is not toxic-appearing.  HENT:     Head: Normocephalic and atraumatic.     Right Ear: External ear normal.     Left Ear: External ear normal.     Nose: Nose normal.  No congestion or rhinorrhea.     Mouth/Throat:     Mouth: Mucous membranes are moist.     Pharynx: Oropharynx is clear. No oropharyngeal exudate or posterior oropharyngeal erythema.  Eyes:     General: No scleral icterus.    Extraocular Movements: Extraocular movements intact.     Conjunctiva/sclera: Conjunctivae normal.     Pupils: Pupils are equal, round, and reactive to light.  Cardiovascular:     Rate and Rhythm: Normal rate  and regular rhythm.     Pulses: Normal pulses.     Heart sounds: Normal heart sounds. No murmur heard.    No friction rub. No gallop.  Pulmonary:     Effort: Pulmonary effort is normal.     Breath sounds: Normal breath sounds. No wheezing, rhonchi or rales.  Abdominal:     General: Abdomen is flat. Bowel sounds are normal. There is no distension.     Palpations: Abdomen is soft.     Tenderness: There is no abdominal tenderness.  Musculoskeletal:        General: No swelling. Normal range of motion.     Cervical back: Normal range of motion.     Right lower leg: No edema.     Left lower leg: No edema.  Lymphadenopathy:     Cervical: No cervical adenopathy.  Skin:    General: Skin is warm and dry.     Capillary Refill: Capillary refill takes less than 2 seconds.     Coloration: Skin is not jaundiced.  Neurological:     General: No focal deficit present.     Mental Status: She is alert and oriented to person, place, and time.  Psychiatric:        Mood and Affect: Mood normal.        Behavior: Behavior normal.   Last CBC Lab Results  Component Value Date   WBC 6.8 02/27/2023   HGB 7.6 (L) 02/27/2023   HCT 26.9 (L) 02/27/2023   MCV 78 (L) 02/27/2023   MCH 22.1 (L) 02/27/2023   RDW 19.7 (H) 02/27/2023   PLT 404 02/27/2023   Last metabolic panel Lab Results  Component Value Date   GLUCOSE 100 (H) 01/01/2023   NA 138 01/01/2023   K 4.4 01/01/2023   CL 104 01/01/2023   CO2 27 11/15/2022   BUN 28 (H) 01/01/2023   CREATININE 1.30 (H) 01/01/2023    GFRNONAA >60 11/15/2022   CALCIUM 8.5 (L) 11/15/2022   PHOS 3.1 06/19/2022   PROT 7.4 07/17/2022   ALBUMIN 3.7 (L) 07/17/2022   LABGLOB 3.7 07/17/2022   AGRATIO 1.0 (L) 07/17/2022   BILITOT 0.4 07/17/2022   ALKPHOS 104 07/17/2022   AST 15 07/17/2022   ALT 11 07/17/2022   ANIONGAP 4 (L) 11/15/2022   Last lipids Lab Results  Component Value Date   CHOL 96 (L) 12/18/2022   HDL 36 (L) 12/18/2022   LDLCALC 45 12/18/2022   TRIG 71 12/18/2022   CHOLHDL 2.7 12/18/2022   Last hemoglobin A1c Lab Results  Component Value Date   HGBA1C 5.5 01/03/2020   Last thyroid functions Lab Results  Component Value Date   TSH 4.050 12/18/2022   Last vitamin D Lab Results  Component Value Date   VD25OH 20.3 (L) 12/18/2022   Last vitamin B12 and Folate Lab Results  Component Value Date   VITAMINB12 403 08/23/2020     Assessment & Plan:   Problem List Items Addressed This Visit       Persistent atrial fibrillation (HCC)    S/p cardioversion on multiple occasions, most recently 1/31.  Regular rate and rhythm detected on exam today.  Recently seen by cardiology for follow-up.  She is currently prescribed amiodarone 200 mg daily and Eliquis 5 mg twice daily.  She is scheduled to see EP (Dr. Nelly Laurence) on 7/19 to discuss A-fib ablation.      Iron deficiency anemia - Primary    Labs from March consistent with iron deficiency anemia.  Oral iron supplementation recommended. -Repeat CBC and iron studies today.  If she remains iron deficient, consider iron infusions and referral to gastroenterology.      Obesity, Class II, BMI 35-39.9    BMI 38.1 today.  Will give he has recently been prescribed by her cardiologist.  Preauthorization pending.      Vitamin D insufficiency    Noted on labs from January.  She is currently taking daily vitamin D supplementation.  Repeat vitamin D level ordered today.      Lumbar back pain    MRI of the lumbar spine completed in March, revealing  neuroforaminal narrowing at L4-L5.  Also notable for multilevel facet arthropathy and narrowing of the right lateral recess at L2-L3.  She has completed physical therapy.  Reports today that back pain has significantly improved.      Breast cancer screening by mammogram    Due for mammogram, which has previously been ordered.  We will assist with scheduling today.      Return in about 3 months (around 08/16/2023).   Billie Lade, MD

## 2023-05-16 NOTE — Assessment & Plan Note (Signed)
S/p cardioversion on multiple occasions, most recently 1/31.  Regular rate and rhythm detected on exam today.  Recently seen by cardiology for follow-up.  She is currently prescribed amiodarone 200 mg daily and Eliquis 5 mg twice daily.  She is scheduled to see EP (Dr. Nelly Laurence) on 7/19 to discuss A-fib ablation.

## 2023-05-16 NOTE — Assessment & Plan Note (Signed)
Labs from March consistent with iron deficiency anemia.  Oral iron supplementation recommended. -Repeat CBC and iron studies today.  If she remains iron deficient, consider iron infusions and referral to gastroenterology.

## 2023-05-16 NOTE — Assessment & Plan Note (Signed)
BMI 38.1 today.  Will give he has recently been prescribed by her cardiologist.  Preauthorization pending.

## 2023-05-16 NOTE — Assessment & Plan Note (Signed)
Noted on labs from January.  She is currently taking daily vitamin D supplementation.  Repeat vitamin D level ordered today.

## 2023-05-16 NOTE — Assessment & Plan Note (Signed)
Due for mammogram, which has previously been ordered.  We will assist with scheduling today.

## 2023-05-16 NOTE — Patient Instructions (Signed)
It was a pleasure to see you today.  Thank you for giving us the opportunity to be involved in your care.  Below is a brief recap of your visit and next steps.  We will plan to see you again in 3 months.  Summary No medication changes today Repeat labs ordered Follow up in 3 months  

## 2023-05-16 NOTE — Assessment & Plan Note (Signed)
MRI of the lumbar spine completed in March, revealing neuroforaminal narrowing at L4-L5.  Also notable for multilevel facet arthropathy and narrowing of the right lateral recess at L2-L3.  She has completed physical therapy.  Reports today that back pain has significantly improved.

## 2023-05-20 ENCOUNTER — Telehealth: Payer: Self-pay | Admitting: Cardiology

## 2023-05-20 LAB — IRON,TIBC AND FERRITIN PANEL
Ferritin: 41 ng/mL (ref 15–150)
Iron Saturation: 81 % (ref 15–55)
Iron: 267 ug/dL (ref 27–139)
Total Iron Binding Capacity: 329 ug/dL (ref 250–450)
UIBC: 62 ug/dL — ABNORMAL LOW (ref 118–369)

## 2023-05-20 LAB — CMP14+EGFR
ALT: 22 IU/L (ref 0–32)
AST: 24 IU/L (ref 0–40)
Albumin/Globulin Ratio: 1
Albumin: 3.7 g/dL — ABNORMAL LOW (ref 3.9–4.9)
Alkaline Phosphatase: 99 IU/L (ref 44–121)
BUN/Creatinine Ratio: 14 (ref 12–28)
BUN: 18 mg/dL (ref 8–27)
Bilirubin Total: <0.2 mg/dL (ref 0.0–1.2)
CO2: 23 mmol/L (ref 20–29)
Calcium: 8.6 mg/dL — ABNORMAL LOW (ref 8.7–10.3)
Chloride: 101 mmol/L (ref 96–106)
Creatinine, Ser: 1.33 mg/dL — ABNORMAL HIGH (ref 0.57–1.00)
Globulin, Total: 3.6 g/dL (ref 1.5–4.5)
Glucose: 91 mg/dL (ref 70–99)
Potassium: 4.8 mmol/L (ref 3.5–5.2)
Sodium: 135 mmol/L (ref 134–144)
Total Protein: 7.3 g/dL (ref 6.0–8.5)
eGFR: 43 mL/min/{1.73_m2} — ABNORMAL LOW (ref >59)

## 2023-05-20 LAB — CBC WITH DIFFERENTIAL/PLATELET
Basophils Absolute: 0 10*3/uL (ref 0.0–0.2)
Basos: 1 %
EOS (ABSOLUTE): 0.1 10*3/uL (ref 0.0–0.4)
Eos: 2 %
Hematocrit: 33.3 % — ABNORMAL LOW (ref 34.0–46.6)
Hemoglobin: 9.9 g/dL — ABNORMAL LOW (ref 11.1–15.9)
Immature Grans (Abs): 0 10*3/uL (ref 0.0–0.1)
Immature Granulocytes: 0 %
Lymphocytes Absolute: 1.6 10*3/uL (ref 0.7–3.1)
Lymphs: 28 %
MCH: 25.5 pg — ABNORMAL LOW (ref 26.6–33.0)
MCHC: 29.7 g/dL — ABNORMAL LOW (ref 31.5–35.7)
MCV: 86 fL (ref 79–97)
Monocytes Absolute: 0.5 10*3/uL (ref 0.1–0.9)
Monocytes: 9 %
Neutrophils Absolute: 3.5 10*3/uL (ref 1.4–7.0)
Neutrophils: 60 %
Platelets: 318 10*3/uL (ref 150–450)
RBC: 3.88 x10E6/uL (ref 3.77–5.28)
RDW: 22.9 % — ABNORMAL HIGH (ref 11.7–15.4)
WBC: 5.8 10*3/uL (ref 3.4–10.8)

## 2023-05-20 LAB — VITAMIN D 25 HYDROXY (VIT D DEFICIENCY, FRACTURES): Vit D, 25-Hydroxy: 23.6 ng/mL — ABNORMAL LOW (ref 30.0–100.0)

## 2023-05-20 LAB — HEMOGLOBIN A1C
Est. average glucose Bld gHb Est-mCnc: 114 mg/dL
Hgb A1c MFr Bld: 5.6 % (ref 4.8–5.6)

## 2023-05-20 NOTE — Telephone Encounter (Signed)
Pt c/o medication issue:  1. Name of Medication:   Semaglutide-Weight Management (WEGOVY) 0.5 MG/0.5ML SOAJ   2. How are you currently taking this medication (dosage and times per day)?   Haven't started taking yet  3. Are you having a reaction (difficulty breathing--STAT)?     4. What is your medication issue?   Patient is following-up to check if her insurance approved her getting this medication and an update on the process.

## 2023-05-21 NOTE — Telephone Encounter (Signed)
Left a message detailing to patient that prior authorization was submitted.

## 2023-05-23 ENCOUNTER — Telehealth: Payer: Self-pay

## 2023-05-23 NOTE — Telephone Encounter (Signed)
Prior Authorization for Maria Fry was approved via Cover My Meds.  Request Reference Number: [id number]. WEGOVY INJ 0.25MG  is approved through 12/02/2023.

## 2023-05-23 NOTE — Telephone Encounter (Signed)
Patient notified and verbalized understanding. 

## 2023-05-27 NOTE — Telephone Encounter (Signed)
I called and spoke with the pts pref'd pharmacy to verify rx for wegovy 0.5mg , would go through and it did.  Authorization is also on file until 12.31.24  Key: [id number]

## 2023-05-28 NOTE — Telephone Encounter (Signed)
Pt notified of approval in other encounter.

## 2023-05-29 ENCOUNTER — Ambulatory Visit (HOSPITAL_COMMUNITY): Payer: Medicare Other

## 2023-05-29 ENCOUNTER — Encounter (HOSPITAL_COMMUNITY): Payer: Medicare Other

## 2023-06-18 ENCOUNTER — Other Ambulatory Visit: Payer: Self-pay | Admitting: Cardiology

## 2023-06-19 NOTE — Progress Notes (Signed)
  Electrophysiology Office Note:    Date:  06/20/2023   ID:  Maria Fry, DOB 11/21/1951, MRN [id number]  PCP:  Billie Lade, MD   [facility] Providers Cardiologist:  Nona Dell, MD Electrophysiologist:  Maurice Small, MD     Referring MD: Billie Lade, MD   History of Present Illness:    Maria Fry is a 72 y.o. female with a hx listed below, significant for CAD, chronic combined systolic and diastolic CHF, OSA, hypothyroid, persistent atrial fibrillation referred for arrhythmia management.  She was initially diagnosed with atrial fibrillation in Sept 2021.  She has been managed with Tikosyn but has had episodes of breakthrough.  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  She has fatigue and shortness of breath with exertion.  No significant chest pain, dependent edema.  She underwent DCCV in January, 2024. She maintained sinus rhythm for some time but has had recurrence of atypical flutter on ECG today. She reports that she feels well and was unaware that her flutter had recurred.    EKGs/Labs/Other Studies Reviewed Today:     TTE: EF 55%, severe LVH  EKG:  Last EKG results: atypical atrial flutter, V-rate 54 BPM   Recent Labs: 11/15/2022: Magnesium 2.2 12/18/2022: TSH 4.050 05/16/2023: ALT 22; BUN 18; Creatinine, Ser 1.33; Hemoglobin 9.9; Platelets 318; Potassium 4.8; Sodium 135     Physical Exam:    VS:  BP 116/78   Pulse (!) 54   Ht 5\' 3"  (1.6 m)   Wt 211 lb 3.2 oz (95.8 kg)   SpO2 98%   BMI 37.41 kg/m     Wt Readings from Last 3 Encounters:  06/20/23 211 lb 3.2 oz (95.8 kg)  05/16/23 215 lb 6.4 oz (97.7 kg)  05/09/23 217 lb (98.4 kg)    GEN:  Well nourished, well developed in no acute distress CARDIAC: iRRR, no murmurs, rubs, gallops RESPIRATORY:  Normal work of breathing MUSCULOSKELETAL: no edema    ASSESSMENT & PLAN:    Persistent atrial fibrillation/atypical atrial flutter:  Pt had recurrence of flutter on  amiodarone  Not a candidate for ablation due to arrhythmia mechanism and BMI DC amiodarone V rates are currently slow; I asked her to monitor her HR. We may need to resume metoprolol  Secondary hypercoagulable state: CHADs2Vasc 5.   CHFrEF EF recovered. 55%        Medication Adjustments/Labs and Tests Ordered: Current medicines are reviewed at length with the patient today.  Concerns regarding medicines are outlined above.  Orders Placed This Encounter  Procedures   EKG 12-Lead   No orders of the defined types were placed in this encounter.    Signed, Maurice Small, MD  06/20/2023 9:31 AM    [facility]

## 2023-06-20 ENCOUNTER — Encounter: Payer: Self-pay | Admitting: Cardiovascular Disease

## 2023-06-20 ENCOUNTER — Ambulatory Visit: Payer: Medicare Other | Attending: Cardiovascular Disease | Admitting: Cardiovascular Disease

## 2023-06-20 VITALS — BP 116/78 | HR 54 | Ht 63.0 in | Wt 211.2 lb

## 2023-06-20 DIAGNOSIS — I4819 Other persistent atrial fibrillation: Secondary | ICD-10-CM

## 2023-06-20 NOTE — Patient Instructions (Signed)
Medication Instructions:  STOP Amiodarone *If you need a refill on your cardiac medications before your next appointment, please call your pharmacy*   Follow-Up: At [facility], you and your health needs are our priority.  As part of our continuing mission to provide you with exceptional heart care, we have created designated Provider Care Teams.  These Care Teams include your primary Cardiologist (physician) and Advanced Practice Providers (APPs -  Physician Assistants and Nurse Practitioners) who all work together to provide you with the care you need, when you need it.  We recommend signing up for the patient portal called "MyChart".  Sign up information is provided on this After Visit Summary.  MyChart is used to connect with patients for Virtual Visits (Telemedicine).  Patients are able to view lab/test results, encounter notes, upcoming appointments, etc.  Non-urgent messages can be sent to your provider as well.   To learn more about what you can do with MyChart, go to [email or web address].    Your next appointment:   6 month(s)  Provider:   York Pellant, MD

## 2023-06-24 ENCOUNTER — Ambulatory Visit (HOSPITAL_COMMUNITY)
Admission: RE | Admit: 2023-06-24 | Discharge: 2023-06-24 | Disposition: A | Discharge status: Home / Self Care | Payer: Medicare Other | Source: Ambulatory Visit | Attending: Internal Medicine | Admitting: Internal Medicine

## 2023-06-24 ENCOUNTER — Other Ambulatory Visit: Payer: Self-pay

## 2023-06-24 ENCOUNTER — Encounter (HOSPITAL_COMMUNITY): Payer: Medicare Other

## 2023-06-24 DIAGNOSIS — N6310 Unspecified lump in the right breast, unspecified quadrant: Secondary | ICD-10-CM

## 2023-06-24 DIAGNOSIS — N6002 Solitary cyst of left breast: Secondary | ICD-10-CM | POA: Diagnosis not present

## 2023-06-24 MED ORDER — WEGOVY 0.5 MG/0.5ML ~~LOC~~ SOAJ: 0.5000 mg | SUBCUTANEOUS | 0 refills | Status: DC

## 2023-06-24 NOTE — Telephone Encounter (Signed)
E-scribed rx to [facility] for wegovy 0.5 mg weekly

## 2023-07-10 ENCOUNTER — Other Ambulatory Visit: Payer: Self-pay | Admitting: Physician Assistant

## 2023-07-14 ENCOUNTER — Other Ambulatory Visit (HOSPITAL_COMMUNITY): Payer: Self-pay | Admitting: Physician Assistant

## 2023-07-22 ENCOUNTER — Encounter: Payer: Self-pay | Admitting: Nurse Practitioner

## 2023-07-22 ENCOUNTER — Ambulatory Visit: Payer: Medicare Other | Attending: Nurse Practitioner | Admitting: Nurse Practitioner

## 2023-07-22 VITALS — BP 124/64 | HR 62 | Ht 63.0 in | Wt 209.3 lb

## 2023-07-22 DIAGNOSIS — I4819 Other persistent atrial fibrillation: Secondary | ICD-10-CM | POA: Diagnosis not present

## 2023-07-22 DIAGNOSIS — I10 Essential (primary) hypertension: Secondary | ICD-10-CM

## 2023-07-22 DIAGNOSIS — I5032 Chronic diastolic (congestive) heart failure: Secondary | ICD-10-CM | POA: Diagnosis not present

## 2023-07-22 DIAGNOSIS — E669 Obesity, unspecified: Secondary | ICD-10-CM

## 2023-07-22 DIAGNOSIS — I2510 Atherosclerotic heart disease of native coronary artery without angina pectoris: Secondary | ICD-10-CM

## 2023-07-22 NOTE — Progress Notes (Unsigned)
Cardiology Office Note:  .   Date:  07/22/2023 ID:  Maria Fry, DOB 1951/04/05, MRN [id number] PCP: Billie Lade, MD  [facility] Providers Cardiologist:  Nona Dell, MD Electrophysiologist:  Maurice Small, MD    History of Present Illness: .   Maria Fry is a 72 y.o. female with a PMH of PAF, CAD, s/p NSTEMI in 2016, HFimpEF, obesity, HTN, CKD, and OSA, who presents today for palpitations evaluation.  Closely followed by A-fib clinic.  Underwent DCCV 12/2022, maintained sinus rhythm for some time, had recurrence of atypical a flutter on EKG.  Last seen by Dr. Diona Browner on May 09, 2023.  Denied any palpitations at that time.  She was scheduled for future follow-up with EP in [facility] for evaluation for A-fib ablation.  Saw Dr. Nelly Laurence in [place] on June 20, 2023.  She was not found to be a candidate for ablation due to arrhythmia mechanism and BMI.  Amiodarone was discontinued and ventricular rates were currently slow, 54 bpm in office that day.  Today she presents for follow-up. She can tell her heart is out of rhythm. Denies any tachycardia. Says she is focusing on weight loss so she can be a candidate for ablation. Expresses some difficulty affording Wegovy. Denies any chest pain, shortness of breath, syncope, presyncope, dizziness, orthopnea, PND, swelling or significant weight changes, acute bleeding, or claudication.   Studies Reviewed: Marland Kitchen    EKG:  EKG Interpretation Date/Time:  Tuesday July 22 2023 15:27:21 EDT Ventricular Rate:  62 PR Interval:    QRS Duration:  104 QT Interval:  342 QTC Calculation: 347 R Axis:   36  Text Interpretation: Atrial fibrillation Nonspecific ST and T wave abnormality When compared with ECG of 20-Jun-2023 09:17, Nonspecific T wave abnormality has replaced inverted T waves in Inferior leads QT has shortened Confirmed by Sharlene Dory [phone]) on 07/22/2023 4:05:07 PM   Monitor 10/2022:  Predominant rhythm is sinus with  heart rate ranging from 43 bpm up to 105 bpm and average heart rate 60 bpm. There were occasional PACs representing 2% total beats. There were occasional to frequent PVCs representing 6% total beats. Possible episode of atrial fibrillation with slow ventricular response noted on October 31 and November 3, cannot exclude lead motion artifact however.  Echo limited 06/2022:  1. Left ventricular ejection fraction, by estimation, is 55%. The left  ventricle has normal function. Left ventricular endocardial border not  optimally defined to evaluate regional wall motion. There is severe left  ventricular hypertrophy.   2. Right ventricular systolic function is normal. The right ventricular  size is normal.   3. The aortic valve is tricuspid. There is mild calcification of the  aortic valve. There is mild thickening of the aortic valve.   4. IVC is small suggesting low RA pressure and hypovolemia.   5. Limited echo to evaluate LV function  Echo 01/2022:  1. Left ventricular ejection fraction, by estimation, is 45 to 50%. The  left ventricle has mildly decreased function. The left ventricle  demonstrates global hypokinesis. The left ventricular internal cavity size  was mildly dilated. There is moderate  concentric left ventricular hypertrophy. Left ventricular diastolic  parameters are consistent with Grade II diastolic dysfunction  (pseudonormalization).   2. Right ventricular systolic function is low normal. The right  ventricular size is normal. There is severely elevated pulmonary artery  systolic pressure. The estimated right ventricular systolic pressure is  67.0 mmHg.   3. The mitral valve  is grossly normal. Moderate mitral valve  regurgitation.   4. Tricuspid valve regurgitation is moderate.   5. The aortic valve is tricuspid. Aortic valve regurgitation is mild.  Aortic regurgitation PHT measures 607 msec.   6. The inferior vena cava is normal in size with greater than 50%   respiratory variability, suggesting right atrial pressure of 3 mmHg.   Comparison(s): Prior images reviewed side by side. LVEF mildly reduced and  low normal RV contraction. Moderate mitral regurgitation. Estimated RVSP  severely elevated.  Lexiscan 11/2019:  IMPRESSION: 1. Small scar in the apical segment of the inferior wall with peri-infarct ischemia.   2. Global hypokinesia and mild LEFT ventricular dilatation.   3. Left ventricular ejection fraction 41%   4. Non invasive risk stratification*: Intermediate   *2012 Appropriate Use Criteria for Coronary Revascularization Focused Update: J Am Coll Cardiol. 2012;59(9):857-881. http://content.dementiazones.com.aspx?articleid=1201161  LHC 06/2015: Dist LAD lesion, 100% stenosed. Mid LAD lesion, 10% stenosed. The left ventricular systolic function is normal.   1. 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 Good LV function   Plan: DAPT, beta blocker and risk factor modification.   Risk Assessment/Calculations:    CHA2DS2-VASc Score = 5  This indicates a 7.2% annual risk of stroke. The patient's score is based upon: CHF History: 1 HTN History: 1 Diabetes History: 0 Stroke History: 0 Vascular Disease History: 1 Age Score: 1 Gender Score: 1  Physical Exam:   VS:  BP 124/64 (BP Location: Left Arm, Patient Position: Sitting, Cuff Size: Large)   Pulse 62   Ht 5\' 3"  (1.6 m)   Wt 209 lb 4.8 oz (94.9 kg)   SpO2 98%   BMI 37.08 kg/m    Wt Readings from Last 3 Encounters:  07/22/23 209 lb 4.8 oz (94.9 kg)  06/20/23 211 lb 3.2 oz (95.8 kg)  05/16/23 215 lb 6.4 oz (97.7 kg)    GEN: Obese, 72 y.o. female in no acute distress NECK: No JVD; No carotid bruits CARDIAC: S1/S2, irregularly irregular rhythm, no murmurs, rubs, gallops RESPIRATORY:  Clear to auscultation without rales, wheezing or rhonchi  ABDOMEN: Soft, non-tender, non-distended EXTREMITIES:  No edema; No deformity   ASSESSMENT AND PLAN:  .    Persistent A-fib Continues to admit to sensation of A-fib which EKG confirms. Followed by A-fib clinic. Pt has had several attempts at restoring NSR that have failed with DCCV. Medical management limited. Amiodarone d/c by EP 06/2023 visit d/t slow ventricular rates. Was previously managed with Tikosyn but had episodes of breakthrough. Currently not a candidate for ablation d/t her BMI. Working on weight loss - see #5. Case d/w with Dr. Diona Browner via secure chat and stated DCCV would be temporary attempt at restoration of SR with high possibility of return to A-flutter/A-fib as this has been the case several times. HR well controlled. Dr. Diona Browner recommended rate control strategy at this time, which I am also in agreement with. I discussed this with patient and she is also in agreement with treatment plan. Dr. Nelly Laurence stated may need to resume metoprolol in future. Discussed with pt to continue monitoring her HR, currently denies any tachycardia. She will contact us if she notices elevated HR. Continue Eliquis 5 mg BID for stroke prevention. On appropriate dosage and denies any bleeding issues. Recommended Kardia Mobile device/ pulse ox to monitor HR.   CAD, s/p NSTEMI in 2016 Occlusion of her dLAD has been medically managed. Stable with no anginal symptoms. No indication for ischemic evaluation. No  medication changes at this time. Heart healthy diet and regular cardiovascular exercise encouraged.   HFimpEF Stage C, NYHA class I-II symptoms. EF 55% 06/2022. Euvolemic and well compensated on exam. Continue current GDMT. Low sodium diet, fluid restriction <2L, and daily weights encouraged. Educated to contact our office for weight gain of 2 lbs overnight or 5 lbs in one week.  HTN BP stable. Discussed to monitor BP at home at least 2 hours after medications and sitting for 5-10 minutes. Continue current medication regimen. Heart healthy diet and regular cardiovascular exercise encouraged.    Obesity Weight loss via diet and exercise encouraged. Discussed the impact being overweight would have on cardiovascular risk. Continue [id number]. Will route note to clinical pharm D for assistance to help with affordability of medication for patient. I discussed [facility] with pt and she is agreeable to referral. I have also placed this referral for her.   Dispo: Follow-up with Dr. Diona Browner as scheduled or sooner if anything changes.   Signed, Sharlene Dory, NP

## 2023-07-22 NOTE — Patient Instructions (Addendum)
Medication Instructions:  Your physician recommends that you continue on your current medications as directed. Please refer to the Current Medication list given to you today.  Labwork: None  Testing/Procedures: None  Follow-Up: Your physician recommends that you schedule a follow-up appointment in: Follow up with Dr.McDowell in December   Any Other Special Instructions Will Be Listed Below (If Applicable).  Kardia Mobile or Pulse ox   Eliquis PAF given  You have been referred to the [facility].   If you need a refill on your cardiac medications before your next appointment, please call your pharmacy.

## 2023-08-04 ENCOUNTER — Other Ambulatory Visit: Payer: Self-pay | Admitting: Internal Medicine

## 2023-08-04 ENCOUNTER — Other Ambulatory Visit: Payer: Self-pay | Admitting: Cardiology

## 2023-08-04 DIAGNOSIS — E785 Hyperlipidemia, unspecified: Secondary | ICD-10-CM

## 2023-08-25 ENCOUNTER — Encounter: Payer: Self-pay | Admitting: Internal Medicine

## 2023-08-25 ENCOUNTER — Ambulatory Visit (INDEPENDENT_AMBULATORY_CARE_PROVIDER_SITE_OTHER): Payer: Medicare Other | Admitting: Internal Medicine

## 2023-08-25 VITALS — BP 117/78 | HR 72 | Ht 63.0 in | Wt 212.8 lb

## 2023-08-25 DIAGNOSIS — R944 Abnormal results of kidney function studies: Secondary | ICD-10-CM

## 2023-08-25 DIAGNOSIS — I4819 Other persistent atrial fibrillation: Secondary | ICD-10-CM

## 2023-08-25 DIAGNOSIS — E669 Obesity, unspecified: Secondary | ICD-10-CM

## 2023-08-25 DIAGNOSIS — Z23 Encounter for immunization: Secondary | ICD-10-CM

## 2023-08-25 DIAGNOSIS — D508 Other iron deficiency anemias: Secondary | ICD-10-CM | POA: Diagnosis not present

## 2023-08-25 DIAGNOSIS — E559 Vitamin D deficiency, unspecified: Secondary | ICD-10-CM | POA: Diagnosis not present

## 2023-08-25 DIAGNOSIS — E66812 Obesity, class 2: Secondary | ICD-10-CM

## 2023-08-25 NOTE — Patient Instructions (Signed)
It was a pleasure to see you today.  Thank you for giving [place] the opportunity to be involved in your care.  Below is a brief recap of your visit and next steps.  We will plan to see you again in 3 months.  Summary No medication changes today Repeat labs ordered Referral placed to dietician We will discuss Wegovy with the representative Follow up in 3 months

## 2023-08-25 NOTE — Assessment & Plan Note (Signed)
Influenza vaccine administered today.

## 2023-08-25 NOTE — Progress Notes (Signed)
Established Patient Office Visit  Subjective   Patient ID: Maria Fry, female    DOB: 1951/05/20  Age: 72 y.o. MRN: [id number]  Chief Complaint  Patient presents with   Follow-up    Follow up   Maria Fry returns to care today for routine follow-up.  She was last evaluated by me on 6/14.  No medication changes were made at that time and repeat labs were ordered.  In the interim, she has been seen by cardiology/EP for follow-up.  There have otherwise been no acute interval events.  Maria Fry reports feeling well today.  Her acute concern is that she has not been able to continue [facility] due to cost.  [name] was previously started by cardiology in an effort to help with weight loss to improve her candidacy so that she can undergo A-fib ablation.  She states that she was able to lose weight while on Wegovy, however it is currently cost prohibitive.  Per cardiology note, a note was routed to their clinical pharmacist for assistance with affordability.  Patient reports that she has not been contacted by their office.  Past Medical History:  Diagnosis Date   Arthritis    Right knee   Atrial fibrillation (HCC)    CAD (coronary artery disease)    a. 06/07/15 NSTEMI/Cath: Dist LAD 100%, mid LAD 10%. Otw nl cors. Nl EF w/ inferoapical AK.   Essential hypertension    Hip pain    History of cardiomyopathy 04/23/2016   a. 04/2016 Echo: EF 45%, Gr2 DD; b. 11/2019 Echo: EF 45-50%, mod LVH. Gr2 DD. Nl RV fxn. Sev dil LA.    Hyperlipidemia    Hypothyroidism    NSTEMI (non-ST elevated myocardial infarction) (HCC) 06/05/2015   Vitamin D deficiency    Past Surgical History:  Procedure Laterality Date   ABDOMINAL HYSTERECTOMY     partial- one ovary remaining   BIOPSY  09/17/2020   Procedure: BIOPSY;  Surgeon: Lanelle Bal, DO;  [facility];  Service: Endoscopy;;   CARDIAC CATHETERIZATION N/A 06/06/2015   Procedure: Left Heart Cath and Coronary Angiography;  Surgeon: Peter M Swaziland, MD;   Location: [facility] INVASIVE CV LAB;  Service: Cardiovascular;  Laterality: N/A;   CARDIOVERSION N/A 09/14/2020   Procedure: CARDIOVERSION;  Surgeon: Jonelle Sidle, MD;  Location: [facility];  Service: Cardiovascular;  Laterality: N/A;   CARDIOVERSION N/A 06/20/2022   Procedure: CARDIOVERSION;  Surgeon: Antoine Poche, MD;  Location: [facility] ORS;  Service: Endoscopy;  Laterality: N/A;   CARDIOVERSION N/A 01/01/2023   Procedure: CARDIOVERSION;  Surgeon: Jodelle Red, MD;  Location: [facility] ENDOSCOPY;  Service: Cardiovascular;  Laterality: N/A;   CHOLECYSTECTOMY     COLONOSCOPY     COLONOSCOPY N/A 08/13/2017   Procedure: COLONOSCOPY;  Surgeon: Corbin Ade, MD;  [facility]: [facility];  Service: Endoscopy;  Laterality: N/A;  8:30 AM   COLONOSCOPY WITH PROPOFOL N/A 01/18/2021   [name]: Multiple tubular adenomas removed, next colonoscopy in 3 years   ESOPHAGOGASTRODUODENOSCOPY N/A 09/11/2016   Procedure: ESOPHAGOGASTRODUODENOSCOPY (EGD);  Surgeon: Corbin Ade, MD;  [facility];  Service: Endoscopy;  Laterality: N/A;  7:45 am - moved to 10/11 @ 10:30   ESOPHAGOGASTRODUODENOSCOPY (EGD) WITH PROPOFOL N/A 09/17/2020   [name]: Diffuse moderate inflammation characterized by erosions and erythema found in the entire stomach.  Biopsies positive for H. pylori.  Patient has not been treated due to drug allergies.   Heel spurs Left    MALONEY  DILATION N/A 09/11/2016   Procedure: [name] DILATION;  Surgeon: Corbin Ade, MD;  [facility];  Service: Endoscopy;  Laterality: N/A;   POLYPECTOMY  08/13/2017   Procedure: POLYPECTOMY;  Surgeon: Corbin Ade, MD;  [facility];  Service: Endoscopy;;  colon   POLYPECTOMY  01/18/2021   Procedure: POLYPECTOMY;  Surgeon: Corbin Ade, MD;  [facility];  Service: Endoscopy;;   SHOULDER ARTHROSCOPY Right    TUBAL LIGATION     Social History   Tobacco Use   Smoking status: Former    Current packs/day: 0.00     Average packs/day: 1 pack/day for 25.0 years (25.0 ttl pk-yrs)    Types: Cigarettes    Start date: 12/02/1977    Quit date: 12/02/2002    Years since quitting: 20.7   Smokeless tobacco: Never   Tobacco comments:    Former smoker 05/10/22  Vaping Use   Vaping status: Never Used  Substance Use Topics   Alcohol use: No    Alcohol/week: 0.0 standard drinks of alcohol   Drug use: No   Family History  Problem Relation Age of Onset   Heart failure Father        Deceased   Heart attack Father        Deceased   Stroke Sister        Deceased   Heart failure Brother        Deceased   Cancer Brother        Deceased   Heart attack Brother        Deceased   COPD Daughter    Arthritis Daughter    Lupus Daughter    Colon cancer Neg Hx    Allergies  Allergen Reactions   Afrin [Oxymetazoline] Anaphylaxis   Amoxicillin Anaphylaxis   Flonase [Fluticasone] Anaphylaxis   Lisinopril Anaphylaxis   Ranolazine Anaphylaxis    Throat swells, Dizzy, Reaction occurred with generic med   Vicodin [Hydrocodone-Acetaminophen] Diarrhea and Nausea And Vomiting   Demerol [Meperidine] Nausea And Vomiting   Lodine [Etodolac] Rash   Review of Systems  Constitutional:  Negative for chills and fever.  HENT:  Negative for sore throat.   Respiratory:  Negative for cough and shortness of breath.   Cardiovascular:  Negative for chest pain, palpitations and leg swelling.  Gastrointestinal:  Negative for abdominal pain, blood in stool, constipation, diarrhea, nausea and vomiting.  Genitourinary:  Negative for dysuria and hematuria.  Musculoskeletal:  Negative for myalgias.  Skin:  Negative for itching and rash.  Neurological:  Negative for dizziness and headaches.  Psychiatric/Behavioral:  Negative for depression and suicidal ideas.      Objective:     BP 117/78 (BP Location: Right Arm, Patient Position: Sitting, Cuff Size: Large)   Pulse 72   Ht 5\' 3"  (1.6 m)   Wt 212 lb 12.8 oz (96.5 kg)   SpO2  95%   BMI 37.70 kg/m  BP Readings from Last 3 Encounters:  08/25/23 117/78  07/22/23 124/64  06/20/23 116/78   Physical Exam Vitals reviewed.  Constitutional:      General: She is not in acute distress.    Appearance: Normal appearance. She is obese. She is not toxic-appearing.  HENT:     Head: Normocephalic and atraumatic.     Right Ear: External ear normal.     Left Ear: External ear normal.     Nose: Nose normal. No congestion or rhinorrhea.     Mouth/Throat:  Mouth: Mucous membranes are moist.     Pharynx: Oropharynx is clear. No oropharyngeal exudate or posterior oropharyngeal erythema.  Eyes:     General: No scleral icterus.    Extraocular Movements: Extraocular movements intact.     Conjunctiva/sclera: Conjunctivae normal.     Pupils: Pupils are equal, round, and reactive to light.  Cardiovascular:     Rate and Rhythm: Normal rate. Rhythm irregular.     Pulses: Normal pulses.     Heart sounds: Normal heart sounds. No murmur heard.    No friction rub. No gallop.  Pulmonary:     Effort: Pulmonary effort is normal.     Breath sounds: Normal breath sounds. No wheezing, rhonchi or rales.  Abdominal:     General: Abdomen is flat. Bowel sounds are normal. There is no distension.     Palpations: Abdomen is soft.     Tenderness: There is no abdominal tenderness.  Musculoskeletal:        General: No swelling. Normal range of motion.     Cervical back: Normal range of motion.     Right lower leg: No edema.     Left lower leg: No edema.  Lymphadenopathy:     Cervical: No cervical adenopathy.  Skin:    General: Skin is warm and dry.     Capillary Refill: Capillary refill takes less than 2 seconds.     Coloration: Skin is not jaundiced.  Neurological:     General: No focal deficit present.     Mental Status: She is alert and oriented to person, place, and time.  Psychiatric:        Mood and Affect: Mood normal.        Behavior: Behavior normal.   Last CBC Lab  Results  Component Value Date   WBC 5.8 05/16/2023   HGB 9.9 (L) 05/16/2023   HCT 33.3 (L) 05/16/2023   MCV 86 05/16/2023   MCH 25.5 (L) 05/16/2023   RDW 22.9 (H) 05/16/2023   PLT 318 05/16/2023   Last metabolic panel Lab Results  Component Value Date   GLUCOSE 91 05/16/2023   NA 135 05/16/2023   K 4.8 05/16/2023   CL 101 05/16/2023   CO2 23 05/16/2023   BUN 18 05/16/2023   CREATININE 1.33 (H) 05/16/2023   EGFR 43 (L) 05/16/2023   CALCIUM 8.6 (L) 05/16/2023   PHOS 3.1 06/19/2022   PROT 7.3 05/16/2023   ALBUMIN 3.7 (L) 05/16/2023   LABGLOB 3.6 05/16/2023   AGRATIO 1.0 05/16/2023   BILITOT <0.2 05/16/2023   ALKPHOS 99 05/16/2023   AST 24 05/16/2023   ALT 22 05/16/2023   ANIONGAP 4 (L) 11/15/2022   Last lipids Lab Results  Component Value Date   CHOL 96 (L) 12/18/2022   HDL 36 (L) 12/18/2022   LDLCALC 45 12/18/2022   TRIG 71 12/18/2022   CHOLHDL 2.7 12/18/2022   Last hemoglobin A1c Lab Results  Component Value Date   HGBA1C 5.6 05/16/2023   Last thyroid functions Lab Results  Component Value Date   TSH 4.050 12/18/2022   Last vitamin D Lab Results  Component Value Date   VD25OH 23.6 (L) 05/16/2023   Last vitamin B12 and Folate Lab Results  Component Value Date   VITAMINB12 403 08/23/2020     Assessment & Plan:   Problem List Items Addressed This Visit       Persistent atrial fibrillation (HCC)    Irregularly irregular rate and rhythm detected on exam today.  She has recently been evaluated by electrophysiology, who has recommended ablation.  She must first lose weight in order to become a candidate for ablation.  Addressing Wegovy as below. -Continue Eliquis 5 mg twice daily      Iron deficiency anemia    Hgb stable and iron studies no longer consistent with iron deficiency on labs from June.  She has remained on oral iron supplementation and requests repeat labs today.      Obesity, Class II, BMI 35-39.9 - Primary    BMI 37.7.  Her acute  concern today is that she is no longer able to afford [facility].  This was previously managed by cardiology.  She would like to resume Wegovy in an effort to improve her candidacy for A-fib ablation.  Patient endorses poor dieting habits.  States that she was previously following a strict diet while on Wegovy. -We will attempt to assist with [facility] affordability by contacting our [facility] representative -Lifestyle modifications aimed at weight loss were reinforced today.  Through shared decision making, she is in agreement with a referral to medical nutrition therapy.      Vitamin D insufficiency    Vitamin D insufficiency noted again on labs from June.  She is currently on daily vitamin D supplementation.  Repeat vitamin D level ordered today.      Decreased GFR    GFR 43 on labs from June, previously > 60.  I suspect this reflects her current baseline but will confirm with repeat BMP today.      Need for influenza vaccination    Influenza vaccine administered today      Return in about 3 months (around 11/24/2023).   Billie Lade, MD

## 2023-08-25 NOTE — Assessment & Plan Note (Signed)
Irregularly irregular rate and rhythm detected on exam today.  She has recently been evaluated by electrophysiology, who has recommended ablation.  She must first lose weight in order to become a candidate for ablation.  Addressing Wegovy as below. -Continue Eliquis 5 mg twice daily

## 2023-08-25 NOTE — Assessment & Plan Note (Signed)
Vitamin D insufficiency noted again on labs from June.  She is currently on daily vitamin D supplementation.  Repeat vitamin D level ordered today.

## 2023-08-25 NOTE — Assessment & Plan Note (Signed)
BMI 37.7.  Her acute concern today is that she is no longer able to afford [facility].  This was previously managed by cardiology.  She would like to resume Wegovy in an effort to improve her candidacy for A-fib ablation.  Patient endorses poor dieting habits.  States that she was previously following a strict diet while on Wegovy. -We will attempt to assist with [facility] affordability by contacting our [facility] representative -Lifestyle modifications aimed at weight loss were reinforced today.  Through shared decision making, she is in agreement with a referral to medical nutrition therapy.

## 2023-08-25 NOTE — Assessment & Plan Note (Signed)
Hgb stable and iron studies no longer consistent with iron deficiency on labs from June.  She has remained on oral iron supplementation and requests repeat labs today.

## 2023-08-25 NOTE — Assessment & Plan Note (Signed)
GFR 43 on labs from June, previously > 60.  I suspect this reflects her current baseline but will confirm with repeat BMP today.

## 2023-08-26 LAB — BASIC METABOLIC PANEL
BUN/Creatinine Ratio: 11 — ABNORMAL LOW (ref 12–28)
BUN: 14 mg/dL (ref 8–27)
CO2: 25 mmol/L (ref 20–29)
Calcium: 8.7 mg/dL (ref 8.7–10.3)
Chloride: 105 mmol/L (ref 96–106)
Creatinine, Ser: 1.3 mg/dL — ABNORMAL HIGH (ref 0.57–1.00)
Glucose: 83 mg/dL (ref 70–99)
Potassium: 4.8 mmol/L (ref 3.5–5.2)
Sodium: 140 mmol/L (ref 134–144)
eGFR: 44 mL/min/{1.73_m2} — ABNORMAL LOW (ref >59)

## 2023-08-26 LAB — CBC WITH DIFFERENTIAL/PLATELET
Basophils Absolute: 0 10*3/uL (ref 0.0–0.2)
Basos: 1 %
EOS (ABSOLUTE): 0.1 10*3/uL (ref 0.0–0.4)
Eos: 2 %
Hematocrit: 32 % — ABNORMAL LOW (ref 34.0–46.6)
Hemoglobin: 9.6 g/dL — ABNORMAL LOW (ref 11.1–15.9)
Immature Grans (Abs): 0 10*3/uL (ref 0.0–0.1)
Immature Granulocytes: 0 %
Lymphocytes Absolute: 2 10*3/uL (ref 0.7–3.1)
Lymphs: 25 %
MCH: 24.6 pg — ABNORMAL LOW (ref 26.6–33.0)
MCHC: 30 g/dL — ABNORMAL LOW (ref 31.5–35.7)
MCV: 82 fL (ref 79–97)
Monocytes Absolute: 0.6 10*3/uL (ref 0.1–0.9)
Monocytes: 8 %
Neutrophils Absolute: 5.1 10*3/uL (ref 1.4–7.0)
Neutrophils: 64 %
Platelets: 348 10*3/uL (ref 150–450)
RBC: 3.9 x10E6/uL (ref 3.77–5.28)
RDW: 17.9 % — ABNORMAL HIGH (ref 11.7–15.4)
WBC: 7.9 10*3/uL (ref 3.4–10.8)

## 2023-08-26 LAB — IRON,TIBC AND FERRITIN PANEL
Ferritin: 8 ng/mL — ABNORMAL LOW (ref 15–150)
Iron Saturation: 6 % — CL (ref 15–55)
Iron: 22 ug/dL — ABNORMAL LOW (ref 27–139)
Total Iron Binding Capacity: 375 ug/dL (ref 250–450)
UIBC: 353 ug/dL (ref 118–369)

## 2023-08-26 LAB — VITAMIN D 25 HYDROXY (VIT D DEFICIENCY, FRACTURES): Vit D, 25-Hydroxy: 28.8 ng/mL — ABNORMAL LOW (ref 30.0–100.0)

## 2023-09-04 ENCOUNTER — Other Ambulatory Visit: Payer: Self-pay | Admitting: Internal Medicine

## 2023-10-14 ENCOUNTER — Encounter: Payer: Medicare Other | Attending: Internal Medicine | Admitting: Nutrition

## 2023-10-14 ENCOUNTER — Encounter: Payer: Self-pay | Admitting: Nutrition

## 2023-10-14 VITALS — Ht 63.0 in | Wt 211.0 lb

## 2023-10-14 DIAGNOSIS — I10 Essential (primary) hypertension: Secondary | ICD-10-CM

## 2023-10-14 DIAGNOSIS — I214 Non-ST elevation (NSTEMI) myocardial infarction: Secondary | ICD-10-CM

## 2023-10-14 DIAGNOSIS — D508 Other iron deficiency anemias: Secondary | ICD-10-CM

## 2023-10-14 DIAGNOSIS — I2510 Atherosclerotic heart disease of native coronary artery without angina pectoris: Secondary | ICD-10-CM

## 2023-10-14 DIAGNOSIS — E669 Obesity, unspecified: Secondary | ICD-10-CM

## 2023-10-14 DIAGNOSIS — E782 Mixed hyperlipidemia: Secondary | ICD-10-CM

## 2023-10-14 NOTE — Patient Instructions (Addendum)
Goals  Focus on whole plant based foods Eat b)6-8, L) 12-2 and d) 5-7 pm Cut out fat back, processed meats, fast foodss, cheese, dairy products Focus on drinking only water and no sodas, tea,  Switch to unsweeted vanilla almond milk from cows milk Walk 15 minutes a day Focus on high fiber foods. Lose 1 lb per week Avoid salt  and fat back.and use more herbs and spices

## 2023-10-14 NOTE — Progress Notes (Signed)
Medical Nutrition Therapy  Appointment Start time:  0800  Appointment End time:  0900  Primary concerns today: OBesity,  Referral diagnosis: E66.9 Preferred learning style: No  Preference Learning readiness: Ready    NUTRITION ASSESSMENT  72 yr old female referred for obesity. "I need to lose weight to have a AFIB procedure done on my heart. I am so tired all the time. With this AFIB, I don't have any energy." Last A1C 5.6%. She was on Wegovy but couldn't afford it. Lost down to 206 lbs when on it. Gained 5 lbs back.  PMH: Low VIt D. Stg 3 CKD, HTN, Obesity, Anemia, Hyperlipidemia, S/p Heart Attack NSTEMI, in 2016, CAD, CHF, OSA, Hypothyroid Cardiologist Dr. Diona Browner. PCP Dr. Durwin Nora. Of note, she is anemic with HGB of 9.6 mg/dl and HCT 32 mg/dl.with low VIt D levels and this may contribute significantly to her chronic fatigue and lack of energy.  She is taking Vit D weekly and Vit D3.  She is eager to apply Lifestyle Medicine and focus on a plant dominant diet of whole grains, fruits, vegetables, legumes to help lose weight, lower BP, lower cholesterol, improve CKD and overall health. She is highly motivated to apply the 6 pillars of health.  Clinical Medical Hx:  Past Medical History:  Diagnosis Date   Arthritis    Right knee   Atrial fibrillation (HCC)    CAD (coronary artery disease)    a. 06/07/15 NSTEMI/Cath: Dist LAD 100%, mid LAD 10%. Otw nl cors. Nl EF w/ inferoapical AK.   Essential hypertension    Hip pain    History of cardiomyopathy 04/23/2016   a. 04/2016 Echo: EF 45%, Gr2 DD; b. 11/2019 Echo: EF 45-50%, mod LVH. Gr2 DD. Nl RV fxn. Sev dil LA.    Hyperlipidemia    Hypothyroidism    NSTEMI (non-ST elevated myocardial infarction) (HCC) 06/05/2015   Vitamin D deficiency     Medications:  Current Outpatient Medications on File Prior to Visit  Medication Sig Dispense Refill   acetaminophen (TYLENOL) 500 MG tablet Take 500 mg by mouth 2 (two) times daily.     amLODipine  (NORVASC) 2.5 MG tablet Take 1 tablet by mouth once daily 90 tablet 0   apixaban (ELIQUIS) 5 MG TABS tablet Take 1 tablet by mouth twice daily 180 tablet 2   atorvastatin (LIPITOR) 80 MG tablet TAKE 1 TABLET BY MOUTH AT BEDTIME 90 tablet 0   calcium carbonate (OS-CAL - DOSED IN MG OF ELEMENTAL CALCIUM) 1250 (500 Ca) MG tablet Take 500 mg by mouth daily.     Cholecalciferol (VITAMIN D3) 50 MCG (2000 UT) capsule Take 1 capsule (2,000 Units total) by mouth daily. 30 capsule 3   clopidogrel (PLAVIX) 75 MG tablet Take 75 mg by mouth daily.     diclofenac Sodium (VOLTAREN) 1 % GEL Apply 1 application topically 4 (four) times daily as needed (pain).     ergocalciferol (VITAMIN D2) 1.25 MG (50000 UT) capsule Take 50,000 Units by mouth once a week.     ferrous sulfate 325 (65 FE) MG EC tablet Take 1 tablet (325 mg total) by mouth daily. (Patient taking differently: Take 325 mg by mouth 4 (four) times a week.) 60 tablet 1   furosemide (LASIX) 40 MG tablet Take 1 tablet (40 mg total) by mouth daily.     gabapentin (NEURONTIN) 300 MG capsule Take 1 capsule by mouth twice daily 60 capsule 0   levothyroxine (SYNTHROID) 100 MCG tablet Take 1 tablet (100  mcg total) by mouth daily before breakfast. 90 tablet 3   magnesium oxide (MAG-OX) 400 MG tablet Take 0.5 tablets (200 mg total) by mouth daily. 30 tablet 6   MAGnesium-Oxide 400 (240 Mg) MG tablet TAKE 1/2 (ONE-HALF) TABLET BY MOUTH DAILY 30 tablet 6   melatonin 3 MG TABS tablet Take 3 mg by mouth at bedtime as needed (Sleep).     nitroGLYCERIN (NITROSTAT) 0.4 MG SL tablet DISSOLVE ONE TABLET UNDER THE TONGUE EVERY 5 MINUTES AS NEEDED FOR CHEST PAIN.  DO NOT EXCEED A TOTAL OF 3 DOSES IN 15 MINUTES 25 tablet 3   ondansetron (ZOFRAN-ODT) 4 MG disintegrating tablet Take 1 tablet (4 mg total) by mouth every 8 (eight) hours as needed for nausea or vomiting. 20 tablet 0   potassium chloride SA (KLOR-CON M) 20 MEQ tablet Take 2 tablets (40 mEq total) by mouth daily.      spironolactone (ALDACTONE) 25 MG tablet Take 1/2 (one-half) tablet by mouth once daily 45 tablet 3   vitamin C (ASCORBIC ACID) 500 MG tablet Take 500 mg by mouth daily.     Semaglutide-Weight Management (WEGOVY) 0.5 MG/0.5ML SOAJ Inject 0.5 mg into the skin once a week. (Patient not taking: Reported on 10/14/2023) 2 mL 0   No current facility-administered medications on file prior to visit.    Labs:  Lab Results  Component Value Date   HGBA1C 5.6 05/16/2023      Latest Ref Rng & Units 08/25/2023   11:39 AM 05/16/2023   10:27 AM 01/01/2023   10:46 AM  CMP  Glucose 70 - 99 mg/dL 83  91  161   BUN 8 - 27 mg/dL 14  18  28    Creatinine 0.57 - 1.00 mg/dL 0.96  0.45  4.09   Sodium 134 - 144 mmol/L 140  135  138   Potassium 3.5 - 5.2 mmol/L 4.8  4.8  4.4   Chloride 96 - 106 mmol/L 105  101  104   CO2 20 - 29 mmol/L 25  23    Calcium 8.7 - 10.3 mg/dL 8.7  8.6    Total Protein 6.0 - 8.5 g/dL  7.3    Total Bilirubin 0.0 - 1.2 mg/dL  <8.1    Alkaline Phos 44 - 121 IU/L  99    AST 0 - 40 IU/L  24    ALT 0 - 32 IU/L  22     Lipid Panel     Component Value Date/Time   CHOL 96 (L) 12/18/2022 1038   TRIG 71 12/18/2022 1038   HDL 36 (L) 12/18/2022 1038   CHOLHDL 2.7 12/18/2022 1038   CHOLHDL 2.7 01/03/2020 0948   VLDL 12 06/23/2017 0902   LDLCALC 45 12/18/2022 1038   LDLCALC 63 01/03/2020 0948   LABVLDL 15 12/18/2022 1038      10/14/2023    8:14 AM 08/25/2023   10:51 AM 07/22/2023    3:22 PM  Vitals with BMI  Height 5\' 3"  5\' 3"  5\' 3"   Weight 211 lbs 212 lbs 13 oz 209 lbs 5 oz  BMI 37.39 37.71 37.09  Systolic  117 124  Diastolic  78 64  Pulse  72 62    Notable Signs/Symptoms: None  Lifestyle & Dietary Hx LIves by herself and one of her sons lives with her. Lots of grandkids and family around [place] to cook with fat back, salt and sugar in her foods.  Estimated daily fluid intake: 40 oz Supplements: Vit D2,  VIt D 3 and Vit C Sleep: good Stress / self-care:  Current average  weekly physical activity: ADL  24-Hr Dietary Recall Eats 3 meals per day  Estimated Energy Needs Calories: 1200 Carbohydrate: 135g Protein: 90g Fat: 33g   NUTRITION DIAGNOSIS  NI-1.7 Predicted excessive energy intake As related to high calorie diet.  As evidenced by BMI 37.Marland Kitchen   NUTRITION INTERVENTION  Nutrition education (E-1) on the following topics:  Nutrition and Diabetes  prevention education provided on My Plate, CHO counting, meal planning, portion sizes, timing of meals, avoiding snacks, benefits of exercising 30 minutes per day and prevention  of DM.  Lifestyle Medicine Discussed at length the real value of a whole plant based lifestyle and the improvement possible reversal in some of her comorbidities that are possible.  - Whole Food, Plant Predominant Nutrition is highly recommended: Eat Plenty of vegetables, Mushrooms, fruits, Legumes, Whole Grains, Nuts, seeds in lieu of processed meats, processed snacks/pastries red meat, poultry, eggs.    -It is better to avoid simple carbohydrates including: Cakes, Sweet Desserts, Ice Cream, Soda (diet and regular), Sweet Tea, Candies, Chips, Cookies, Store Bought Juices, Alcohol in Excess of  1-2 drinks a day, Lemonade,  Artificial Sweeteners, Doughnuts, Coffee Creamers, "Sugar-free" Products, etc, etc.  This is not a complete list.....  Exercise: If you are able: 30 -60 minutes a day ,4 days a week, or 150 minutes a week.  The longer the better.  Combine stretch, strength, and aerobic activities.  If you were told in the past that you have high risk for cardiovascular diseases, you may seek evaluation by your heart doctor prior to initiating moderate to intense exercise programs.   Handouts Provided Include  Lifestyle Medicine handouts  Learning Style & Readiness for Change Teaching method utilized: Visual & Auditory  Demonstrated degree of understanding via: Teach Back  Barriers to learning/adherence to lifestyle change:  none  Goals Established by Pt Goals  Focus on whole plant based foods Eat b)6-8, L) 12-2 and d) 5-7 pm Cut out fat back, processed meats, fast foodss, cheese, dairy products Focus on drinking only water and no sodas, tea,  Switch to unsweeted vanilla almond milk from cows milk Walk 15 minutes a day Focus on high fiber foods. Lose 1 lb per week Avoid salt  and fat back.and use more herbs and spices   MONITORING & EVALUATION Dietary intake, weekly physical activity, and weight in 2 months. Recommend to address Anemia if not already addressed.  Next Steps  Patient is to work on incorporating more whole plant based foods in her diet and cut out fat back, high fat foods and high salt foods.Marland Kitchen

## 2023-10-21 ENCOUNTER — Other Ambulatory Visit: Payer: Self-pay | Admitting: Internal Medicine

## 2023-10-21 DIAGNOSIS — E785 Hyperlipidemia, unspecified: Secondary | ICD-10-CM

## 2023-11-03 ENCOUNTER — Other Ambulatory Visit: Payer: Self-pay | Admitting: Cardiology

## 2023-11-03 ENCOUNTER — Other Ambulatory Visit: Payer: Self-pay | Admitting: Internal Medicine

## 2023-11-11 ENCOUNTER — Ambulatory Visit: Payer: Medicare Other | Attending: Cardiology | Admitting: Cardiology

## 2023-11-11 ENCOUNTER — Encounter: Payer: Self-pay | Admitting: Cardiology

## 2023-11-11 VITALS — BP 110/60 | HR 67 | Ht 63.0 in | Wt 213.0 lb

## 2023-11-11 DIAGNOSIS — I4819 Other persistent atrial fibrillation: Secondary | ICD-10-CM

## 2023-11-11 DIAGNOSIS — I5032 Chronic diastolic (congestive) heart failure: Secondary | ICD-10-CM

## 2023-11-11 DIAGNOSIS — I2510 Atherosclerotic heart disease of native coronary artery without angina pectoris: Secondary | ICD-10-CM

## 2023-11-11 NOTE — Progress Notes (Signed)
    Cardiology Office Note  Date: 11/11/2023   ID: Maria Fry, DOB 12/23/1950, MRN [id number]  History of Present Illness: Maria Fry is a 72 y.o. female last seen in August by Ms. Philis Nettle NP, I reviewed the note.  She is here for a follow-up visit.  States that she she has been less aware of atrial fibrillation than she was in the past, has some "good days."  She reports compliance with her medications, at this point on Eliquis and no AV nodal blockers, was taken off amiodarone already.  She had to stop Wegovy due to high cost.  She is working with a nutritionist to try and lose some weight.  Blood pressure well-controlled today.  She is on Aldactone and Norvasc.  Physical Exam: VS:  BP 110/60   Pulse 67   Ht 5\' 3"  (1.6 m)   Wt 213 lb (96.6 kg)   SpO2 98%   BMI 37.73 kg/m , BMI Body mass index is 37.73 kg/m.  Wt Readings from Last 3 Encounters:  11/11/23 213 lb (96.6 kg)  10/14/23 211 lb (95.7 kg)  08/25/23 212 lb 12.8 oz (96.5 kg)    General: Patient appears comfortable at rest. HEENT: Conjunctiva and lids normal. Neck: Supple, no elevated JVP or carotid bruits. Lungs: Clear to auscultation, nonlabored breathing at rest. Cardiac: Regular rate and rhythm, no S3 or significant systolic murmur. Extremities: No pitting edema.  ECG:  An ECG dated 07/22/2023 was personally reviewed today and demonstrated:  Rate controlled atrial fibrillation at 62 bpm, nonspecific ST-T changes.  Labwork: 11/15/2022: Magnesium 2.2 12/18/2022: TSH 4.050 05/16/2023: ALT 22; AST 24 08/25/2023: BUN 14; Creatinine, Ser 1.30; Hemoglobin 9.6; Platelets 348; Potassium 4.8; Sodium 140     Component Value Date/Time   CHOL 96 (L) 12/18/2022 1038   TRIG 71 12/18/2022 1038   HDL 36 (L) 12/18/2022 1038   CHOLHDL 2.7 12/18/2022 1038   CHOLHDL 2.7 01/03/2020 0948   VLDL 12 06/23/2017 0902   LDLCALC 45 12/18/2022 1038   LDLCALC 63 01/03/2020 0948   Other Studies Reviewed Today:  No interval cardiac  testing for review today.  Assessment and Plan:  1.  CAD status post NSTEMI in 2016 with occlusion of the distal LAD managed medically.  No angina on medical therapy.  Continue Lipitor 80 mg daily, LDL was 45 back in January.   2.  Paroxysmal to persistent atrial fibrillation with CHA2DS2-VASc score of 5.  She has not had optimal rhythm control on either Tikosyn or amiodarone, most recently focusing on heart rate control strategy and anticoagulation.  She has had follow-up with the EP and at this point not felt to be a good candidate for atrial fibrillation ablation.  Continue Eliquis.  She is trying to lose some weight as well through consultation with a nutritionist.   3.  HFpEF, LVEF approximately 55% by echocardiogram in July 2023.  She is on Lasix with potassium supplement.  NYHA class II dyspnea.  Continue Aldactone and Lasix with potassium supplement.  Disposition:  Follow up  6 months.  Signed, Jonelle Sidle, M.D., F.A.C.C. [facility]

## 2023-11-11 NOTE — Patient Instructions (Signed)
Medication Instructions:  Your physician recommends that you continue on your current medications as directed. Please refer to the Current Medication list given to you today.   Labwork: None today  Testing/Procedures: None today  Follow-Up: 6 months  Any Other Special Instructions Will Be Listed Below (If Applicable).  If you need a refill on your cardiac medications before your next appointment, please call your pharmacy.  

## 2023-11-20 ENCOUNTER — Ambulatory Visit (INDEPENDENT_AMBULATORY_CARE_PROVIDER_SITE_OTHER): Payer: Medicare Other | Admitting: Internal Medicine

## 2023-11-20 ENCOUNTER — Encounter: Payer: Self-pay | Admitting: Internal Medicine

## 2023-11-20 VITALS — BP 126/76 | HR 67 | Ht 63.0 in | Wt 214.0 lb

## 2023-11-20 DIAGNOSIS — D508 Other iron deficiency anemias: Secondary | ICD-10-CM | POA: Diagnosis not present

## 2023-11-20 DIAGNOSIS — E66812 Obesity, class 2: Secondary | ICD-10-CM | POA: Diagnosis not present

## 2023-11-20 DIAGNOSIS — N1831 Chronic kidney disease, stage 3a: Secondary | ICD-10-CM | POA: Diagnosis not present

## 2023-11-20 MED ORDER — EMPAGLIFLOZIN 10 MG PO TABS: 10.0000 mg | ORAL_TABLET | Freq: Every day | ORAL | 2 refills | Status: DC

## 2023-11-20 NOTE — Patient Instructions (Signed)
It was a pleasure to see you today.  Thank you for giving [place] the opportunity to be involved in your care.  Below is a brief recap of your visit and next steps.  We will plan to see you again in 3 months.  Summary Start Jardiance 10 mg daily in the setting of chronic kidney disease Repeat labs ordered Follow up in 3 months

## 2023-11-20 NOTE — Assessment & Plan Note (Signed)
Her weight today is 214 pounds.  She remains focused on weight loss but is frustrated with the lack of results.  Previously prescribed Wegovy but it is not covered by insurance.  She is focusing on dietary changes and met with medical nutrition therapy in November.  Follow-up is scheduled for January.

## 2023-11-20 NOTE — Assessment & Plan Note (Signed)
Labs from September were consistent with iron deficiency anemia.  I recommended resuming oral iron supplementation, which she has done.  Repeat CBC and iron studies ordered today.  Previously followed by gastroenterology but has not been seen since 2022.  Last underwent colonoscopy in February 2022.  Recommend returning to care with gastroenterology in the setting of iron deficiency anemia.  We will attempt to arrange follow-up and place a new referral if needed.

## 2023-11-20 NOTE — Assessment & Plan Note (Signed)
Labs from September consistent with CKD 3A.  Not currently on ACEi /ARB due to allergies. -Start Jardiance 10 mg daily -Repeat BMP ordered today

## 2023-11-20 NOTE — Progress Notes (Signed)
Established Patient Office Visit  Subjective   Patient ID: Maria Fry, female    DOB: 1951/08/31  Age: 72 y.o. MRN: [id number]  Chief Complaint  Patient presents with   Follow-up   Maria Fry returns to care today for routine follow-up.  She was last evaluated by me on 9/23.  No medication changes were made at that time, repeat labs were ordered, and 32-month follow-up was arranged.  In the interim, she has been seen by cardiology for follow-up.  There have otherwise been no acute interval events.  Maria Fry reports feeling well today.  She is asymptomatic and has no acute concerns to discuss.  Past Medical History:  Diagnosis Date   Arthritis    Right knee   Atrial fibrillation (HCC)    CAD (coronary artery disease)    a. 06/07/15 NSTEMI/Cath: Dist LAD 100%, mid LAD 10%. Otw nl cors. Nl EF w/ inferoapical AK.   Essential hypertension    Hip pain    History of cardiomyopathy 04/23/2016   a. 04/2016 Echo: EF 45%, Gr2 DD; b. 11/2019 Echo: EF 45-50%, mod LVH. Gr2 DD. Nl RV fxn. Sev dil LA.    Hyperlipidemia    Hypothyroidism    NSTEMI (non-ST elevated myocardial infarction) (HCC) 06/05/2015   Vitamin D deficiency    Past Surgical History:  Procedure Laterality Date   ABDOMINAL HYSTERECTOMY     partial- one ovary remaining   BIOPSY  09/17/2020   Procedure: BIOPSY;  Surgeon: Lanelle Bal, DO;  Location: [facility];  Service: Endoscopy;;   CARDIAC CATHETERIZATION N/A 06/06/2015   Procedure: Left Heart Cath and Coronary Angiography;  Surgeon: Peter M Swaziland, MD;  Location: [facility] INVASIVE CV LAB;  Service: Cardiovascular;  Laterality: N/A;   CARDIOVERSION N/A 09/14/2020   Procedure: CARDIOVERSION;  Surgeon: Jonelle Sidle, MD;  Location: [facility];  Service: Cardiovascular;  Laterality: N/A;   CARDIOVERSION N/A 06/20/2022   Procedure: CARDIOVERSION;  Surgeon: Antoine Poche, MD;  Location: [facility];  Service: Endoscopy;  Laterality: N/A;   CARDIOVERSION N/A 01/01/2023    Procedure: CARDIOVERSION;  Surgeon: Jodelle Red, MD;  Location[facility] ENDOSCOPY;  Service: Cardiovascular;  Laterality: N/A;   CHOLECYSTECTOMY     COLONOSCOPY     COLONOSCOPY N/A 08/13/2017   Procedure: COLONOSCOPY;  Surgeon: Corbin Ade, MD;  Location: [facility];  Service: Endoscopy;  Laterality: N/A;  8:30 AM   COLONOSCOPY WITH PROPOFOL N/A 01/18/2021   [name]: Multiple tubular adenomas removed, next colonoscopy in 3 years   ESOPHAGOGASTRODUODENOSCOPY N/A 09/11/2016   Procedure: ESOPHAGOGASTRODUODENOSCOPY (EGD);  Surgeon: Corbin Ade, MD;  Location: [facility];  Service: Endoscopy;  Laterality: N/A;  7:45 am - moved to 10/11 @ 10:30   ESOPHAGOGASTRODUODENOSCOPY (EGD) WITH PROPOFOL N/A 09/17/2020   [name]: Diffuse moderate inflammation characterized by erosions and erythema found in the entire stomach.  Biopsies positive for H. pylori.  Patient has not been treated due to drug allergies.   Heel spurs Left    MALONEY DILATION N/A 09/11/2016   Procedure: [name] DILATION;  Surgeon: Corbin Ade, MD;  Location: [facility];  Service: Endoscopy;  Laterality: N/A;   POLYPECTOMY  08/13/2017   Procedure: POLYPECTOMY;  Surgeon: Corbin Ade, MD;  Location: [facility];  Service: Endoscopy;;  colon   POLYPECTOMY  01/18/2021   Procedure: POLYPECTOMY;  Surgeon: Corbin Ade, MD;  Location: [facility];  Service: Endoscopy;;   SHOULDER ARTHROSCOPY Right  TUBAL LIGATION     Social History   Tobacco Use   Smoking status: Former    Current packs/day: 0.00    Average packs/day: 1 pack/day for 25.0 years (25.0 ttl pk-yrs)    Types: Cigarettes    Start date: 12/02/1977    Quit date: 12/02/2002    Years since quitting: 20.9   Smokeless tobacco: Never   Tobacco comments:    Former smoker 05/10/22  Vaping Use   Vaping status: Never Used  Substance Use Topics   Alcohol use: No    Alcohol/week: 0.0 standard drinks of alcohol   Drug use: No   Family History  Problem  Relation Age of Onset   Heart failure Father        Deceased   Heart attack Father        Deceased   Stroke Sister        Deceased   Heart failure Brother        Deceased   Cancer Brother        Deceased   Heart attack Brother        Deceased   COPD Daughter    Arthritis Daughter    Lupus Daughter    Colon cancer Neg Hx    Allergies  Allergen Reactions   Afrin [Oxymetazoline] Anaphylaxis   Amoxicillin Anaphylaxis   Flonase [Fluticasone] Anaphylaxis   Lisinopril Anaphylaxis   Ranolazine Anaphylaxis    Throat swells, Dizzy, Reaction occurred with generic med   Vicodin [Hydrocodone-Acetaminophen] Diarrhea and Nausea And Vomiting   Demerol [Meperidine] Nausea And Vomiting   Lodine [Etodolac] Rash   Review of Systems  Constitutional:  Negative for chills and fever.  HENT:  Negative for sore throat.   Respiratory:  Negative for cough and shortness of breath.   Cardiovascular:  Negative for chest pain, palpitations and leg swelling.  Gastrointestinal:  Negative for abdominal pain, blood in stool, constipation, diarrhea, nausea and vomiting.  Genitourinary:  Negative for dysuria and hematuria.  Musculoskeletal:  Negative for myalgias.  Skin:  Negative for itching and rash.  Neurological:  Negative for dizziness and headaches.  Psychiatric/Behavioral:  Negative for depression and suicidal ideas.      Objective:     BP 126/76   Pulse 67   Ht 5\' 3"  (1.6 m)   Wt 214 lb (97.1 kg)   SpO2 95%   BMI 37.91 kg/m  BP Readings from Last 3 Encounters:  11/20/23 126/76  11/11/23 110/60  08/25/23 117/78   Physical Exam Vitals reviewed.  Constitutional:      General: She is not in acute distress.    Appearance: Normal appearance. She is obese. She is not toxic-appearing.  HENT:     Head: Normocephalic and atraumatic.     Right Ear: External ear normal.     Left Ear: External ear normal.     Nose: Nose normal. No congestion or rhinorrhea.     Mouth/Throat:     Mouth:  Mucous membranes are moist.     Pharynx: Oropharynx is clear. No oropharyngeal exudate or posterior oropharyngeal erythema.  Eyes:     General: No scleral icterus.    Extraocular Movements: Extraocular movements intact.     Conjunctiva/sclera: Conjunctivae normal.     Pupils: Pupils are equal, round, and reactive to light.  Cardiovascular:     Rate and Rhythm: Normal rate. Rhythm irregular.     Pulses: Normal pulses.     Heart sounds: Normal heart sounds. No murmur heard.  No friction rub. No gallop.  Pulmonary:     Effort: Pulmonary effort is normal.     Breath sounds: Normal breath sounds. No wheezing, rhonchi or rales.  Abdominal:     General: Abdomen is flat. Bowel sounds are normal. There is no distension.     Palpations: Abdomen is soft.     Tenderness: There is no abdominal tenderness.  Musculoskeletal:        General: No swelling. Normal range of motion.     Cervical back: Normal range of motion.     Right lower leg: No edema.     Left lower leg: No edema.  Lymphadenopathy:     Cervical: No cervical adenopathy.  Skin:    General: Skin is warm and dry.     Capillary Refill: Capillary refill takes less than 2 seconds.     Coloration: Skin is not jaundiced.  Neurological:     General: No focal deficit present.     Mental Status: She is alert and oriented to person, place, and time.  Psychiatric:        Mood and Affect: Mood normal.        Behavior: Behavior normal.   Last CBC Lab Results  Component Value Date   WBC 7.9 08/25/2023   HGB 9.6 (L) 08/25/2023   HCT 32.0 (L) 08/25/2023   MCV 82 08/25/2023   MCH 24.6 (L) 08/25/2023   RDW 17.9 (H) 08/25/2023   PLT 348 08/25/2023   Last metabolic panel Lab Results  Component Value Date   GLUCOSE 83 08/25/2023   NA 140 08/25/2023   K 4.8 08/25/2023   CL 105 08/25/2023   CO2 25 08/25/2023   BUN 14 08/25/2023   CREATININE 1.30 (H) 08/25/2023   EGFR 44 (L) 08/25/2023   CALCIUM 8.7 08/25/2023   PHOS 3.1 06/19/2022    PROT 7.3 05/16/2023   ALBUMIN 3.7 (L) 05/16/2023   LABGLOB 3.6 05/16/2023   AGRATIO 1.0 05/16/2023   BILITOT <0.2 05/16/2023   ALKPHOS 99 05/16/2023   AST 24 05/16/2023   ALT 22 05/16/2023   ANIONGAP 4 (L) 11/15/2022   Last lipids Lab Results  Component Value Date   CHOL 96 (L) 12/18/2022   HDL 36 (L) 12/18/2022   LDLCALC 45 12/18/2022   TRIG 71 12/18/2022   CHOLHDL 2.7 12/18/2022   Last hemoglobin A1c Lab Results  Component Value Date   HGBA1C 5.6 05/16/2023   Last thyroid functions Lab Results  Component Value Date   TSH 4.050 12/18/2022   Last vitamin D Lab Results  Component Value Date   VD25OH 28.8 (L) 08/25/2023   Last vitamin B12 and Folate Lab Results  Component Value Date   VITAMINB12 403 08/23/2020     Assessment & Plan:   Problem List Items Addressed This Visit     Chronic kidney disease, stage 3a (HCC)   Labs from September consistent with CKD 3A.  Not currently on ACEi /ARB due to allergies. -Start Jardiance 10 mg daily -Repeat BMP ordered today      Iron deficiency anemia   Labs from September were consistent with iron deficiency anemia.  I recommended resuming oral iron supplementation, which she has done.  Repeat CBC and iron studies ordered today.  Previously followed by gastroenterology but has not been seen since 2022.  Last underwent colonoscopy in February 2022.  Recommend returning to care with gastroenterology in the setting of iron deficiency anemia.  We will attempt to arrange follow-up and place  a new referral if needed.      Obesity, Class II, BMI 35-39.9   Her weight today is 214 pounds.  She remains focused on weight loss but is frustrated with the lack of results.  Previously prescribed Wegovy but it is not covered by insurance.  She is focusing on dietary changes and met with medical nutrition therapy in November.  Follow-up is scheduled for January.      Return in about 3 months (around 02/18/2024).   Billie Lade,  MD

## 2023-11-21 LAB — CBC WITH DIFFERENTIAL/PLATELET
Basophils Absolute: 0 10*3/uL (ref 0.0–0.2)
Basos: 1 %
EOS (ABSOLUTE): 0.1 10*3/uL (ref 0.0–0.4)
Eos: 1 %
Hematocrit: 35.4 % (ref 34.0–46.6)
Hemoglobin: 10.4 g/dL — ABNORMAL LOW (ref 11.1–15.9)
Immature Grans (Abs): 0 10*3/uL (ref 0.0–0.1)
Immature Granulocytes: 0 %
Lymphocytes Absolute: 2.1 10*3/uL (ref 0.7–3.1)
Lymphs: 32 %
MCH: 25.2 pg — ABNORMAL LOW (ref 26.6–33.0)
MCHC: 29.4 g/dL — ABNORMAL LOW (ref 31.5–35.7)
MCV: 86 fL (ref 79–97)
Monocytes Absolute: 0.5 10*3/uL (ref 0.1–0.9)
Monocytes: 8 %
Neutrophils Absolute: 3.7 10*3/uL (ref 1.4–7.0)
Neutrophils: 58 %
Platelets: 351 10*3/uL (ref 150–450)
RBC: 4.13 x10E6/uL (ref 3.77–5.28)
RDW: 21.3 % — ABNORMAL HIGH (ref 11.7–15.4)
WBC: 6.4 10*3/uL (ref 3.4–10.8)

## 2023-11-21 LAB — BASIC METABOLIC PANEL
BUN/Creatinine Ratio: 15 (ref 12–28)
BUN: 17 mg/dL (ref 8–27)
CO2: 22 mmol/L (ref 20–29)
Calcium: 8.9 mg/dL (ref 8.7–10.3)
Chloride: 104 mmol/L (ref 96–106)
Creatinine, Ser: 1.1 mg/dL — ABNORMAL HIGH (ref 0.57–1.00)
Glucose: 88 mg/dL (ref 70–99)
Potassium: 4.4 mmol/L (ref 3.5–5.2)
Sodium: 139 mmol/L (ref 134–144)
eGFR: 54 mL/min/{1.73_m2} — ABNORMAL LOW (ref >59)

## 2023-11-21 LAB — IRON,TIBC AND FERRITIN PANEL
Ferritin: 18 ng/mL (ref 15–150)
Iron Saturation: 33 % (ref 15–55)
Iron: 114 ug/dL (ref 27–139)
Total Iron Binding Capacity: 343 ug/dL (ref 250–450)
UIBC: 229 ug/dL (ref 118–369)

## 2023-11-28 ENCOUNTER — Telehealth: Payer: Self-pay | Admitting: *Deleted

## 2023-11-28 ENCOUNTER — Encounter: Payer: Self-pay | Admitting: Gastroenterology

## 2023-11-28 ENCOUNTER — Ambulatory Visit: Payer: Medicare Other | Admitting: Gastroenterology

## 2023-11-28 VITALS — BP 132/77 | HR 75 | Temp 97.8°F | Ht 63.0 in | Wt 218.6 lb

## 2023-11-28 DIAGNOSIS — Z09 Encounter for follow-up examination after completed treatment for conditions other than malignant neoplasm: Secondary | ICD-10-CM

## 2023-11-28 DIAGNOSIS — Z862 Personal history of diseases of the blood and blood-forming organs and certain disorders involving the immune mechanism: Secondary | ICD-10-CM

## 2023-11-28 DIAGNOSIS — Z860101 Personal history of adenomatous and serrated colon polyps: Secondary | ICD-10-CM

## 2023-11-28 DIAGNOSIS — Z7901 Long term (current) use of anticoagulants: Secondary | ICD-10-CM

## 2023-11-28 DIAGNOSIS — D509 Iron deficiency anemia, unspecified: Secondary | ICD-10-CM

## 2023-11-28 DIAGNOSIS — Z8619 Personal history of other infectious and parasitic diseases: Secondary | ICD-10-CM

## 2023-11-28 NOTE — Telephone Encounter (Signed)
Patient with diagnosis of afib on Eliquis for anticoagulation.    Procedure: Colonoscopy  Date of procedure: TBD   CHA2DS2-VASc Score = 5   This indicates a 7.2% annual risk of stroke. The patient's score is based upon: CHF History: 1 HTN History: 1 Diabetes History: 0 Stroke History: 0 Vascular Disease History: 1 Age Score: 1 Gender Score: 1    CrCl 74 mL/min Platelet count 351   Per office protocol, patient can hold Eliquis for 2 days prior to procedure.     **This guidance is not considered finalized until pre-operative APP has relayed final recommendations.**

## 2023-11-28 NOTE — Patient Instructions (Signed)
Colonoscopy to be scheduled.  Take iron at least 3-4 times per week. We will have you hold it for one week before your colonoscopy.  We will reach out to cardiology to get approval to hold your Eliquis for two days before your colonoscopy.

## 2023-11-28 NOTE — Progress Notes (Signed)
GI Office Note    Referring Provider: Billie Lade, MD Primary Care Physician:  Billie Lade, MD  Primary Gastroenterologist: Roetta Sessions, MD   Chief Complaint   Chief Complaint  Patient presents with   New Patient (Initial Visit)    Pt here for colonoscopy visit    History of Present Illness   Maria Fry is a 72 y.o. female presenting today at the request of Dr. Durwin Nora for IDA. Last seen in 03/2021. At that time she had history of IDA, melena in setting of Eliquis. H/o H.pylori s/p eradication and numerous tubular adenomas removed.   Labs from 01/2023 with iron sat 5%, ferritin 7, iron 19, TIBC 371. Hgb 7.6 (down from 10.5 in 12/2022).  In 05/2023, iron sat 81%, iron 267, ferritin 41, Hgb 9.9. Advised to d/c oral iron due to elevated labs.  In 08/2023, iron 22, iron sat 6%, ferritin 8, Hgb 9.6. Restarted oral iron.   Last week, iron 114, TIBC 343, iron sat 33, ferritin 18. Hgb 10.4.  Component     Latest Ref Rng 09/06/2021 10/25/2021 01/15/2022 06/18/2022 06/19/2022 07/01/2022  Hemoglobin     11.1 - 15.9 g/dL 40.9  81.1 (L)  9.8 (L)  9.2 (L)  8.5 (L)  8.2 (L)   HCT     34.0 - 46.6 % 33.3 (L)  31.6 (L)  30.4 (L)  30.0 (L)  28.2 (L)  27.7 (L)   MCV     79 - 97 fL 94  94.0  91.0  76.9 (L)  77.3 (L)  77 (L)    Component     Latest Ref Rng 09/05/2022 01/01/2023 02/27/2023 05/16/2023 08/25/2023 11/20/2023  Hemoglobin     11.1 - 15.9 g/dL 9.4 (L)  91.4 (L)  7.6 (L)  9.9 (L)  9.6 (L)  10.4 (L)   HCT     34.0 - 46.6 % 30.6 (L)  31.0 (L)  26.9 (L)  33.3 (L)  32.0 (L)  35.4   MCV     79 - 97 fL 79.9 (L)   78 (L)  86  82  86     Component     Latest Ref Rng 11/30/2020 06/18/2022 02/27/2023 05/16/2023 08/25/2023 11/20/2023  Ferritin     15 - 150 ng/mL 20  4 (L)  7 (L)  41  8 (L)  18   Iron     27 - 139 ug/dL 49  21 (L)  19 (L)  782 (HH)  22 (L)  114   TIBC     250 - 450 ug/dL 956  213 (H)  086  578  375  343   UIBC     118 - 369 ug/dL  469  629  62 (L)  528  229   Iron  Saturation     15 - 55 %   5 (LL)  81 (HH)  6 (LL)  33     Today: not a diabetic. Just recently started jardiance due to kidneys. Does not see nephrology for stage 3a CKD. No brbpr, melena. No constipation/diarrhea. No abdominal pain. No heartburn, dysphagia. No n/v. Seeing nutrition for goal of weight loss. Rare Goody powder for headache. No other NSAIDs. Takes iron off/on again. Currently taking about 3-4 times per week.  EGD 09/2020: -h. Pylori gastritis  Colonoscopy 01/2021: -six 3-26mm polyps removed, multiple tubular adenomas -repeat colonoscopy 3 years  Medications   Current Outpatient Medications  Medication Sig Dispense  Refill   acetaminophen (TYLENOL) 500 MG tablet Take 500 mg by mouth 2 (two) times daily.     amLODipine (NORVASC) 2.5 MG tablet Take 1 tablet by mouth once daily 90 tablet 0   apixaban (ELIQUIS) 5 MG TABS tablet Take 1 tablet by mouth twice daily 180 tablet 2   atorvastatin (LIPITOR) 80 MG tablet TAKE 1 TABLET BY MOUTH AT BEDTIME 90 tablet 0   calcium carbonate (OS-CAL - DOSED IN MG OF ELEMENTAL CALCIUM) 1250 (500 Ca) MG tablet Take 500 mg by mouth daily.     Cholecalciferol (VITAMIN D3) 50 MCG (2000 UT) capsule Take 1 capsule (2,000 Units total) by mouth daily. 30 capsule 3   clopidogrel (PLAVIX) 75 MG tablet Take 75 mg by mouth daily.     diclofenac Sodium (VOLTAREN) 1 % GEL Apply 1 application topically 4 (four) times daily as needed (pain).     empagliflozin (JARDIANCE) 10 MG TABS tablet Take 1 tablet (10 mg total) by mouth daily before breakfast. 30 tablet 2   ergocalciferol (VITAMIN D2) 1.25 MG (50000 UT) capsule Take 50,000 Units by mouth once a week.     ferrous sulfate 325 (65 FE) MG EC tablet Take 1 tablet (325 mg total) by mouth daily. (Patient taking differently: Take 325 mg by mouth 4 (four) times a week.) 60 tablet 1   furosemide (LASIX) 40 MG tablet Take 1 tablet (40 mg total) by mouth daily.     gabapentin (NEURONTIN) 300 MG capsule Take 1 capsule by  mouth twice daily 60 capsule 0   levothyroxine (SYNTHROID) 100 MCG tablet Take 1 tablet (100 mcg total) by mouth daily before breakfast. 90 tablet 3   MAGnesium-Oxide 400 (240 Mg) MG tablet TAKE 1/2 (ONE-HALF) TABLET BY MOUTH DAILY 30 tablet 6   melatonin 3 MG TABS tablet Take 3 mg by mouth at bedtime as needed (Sleep).     nitroGLYCERIN (NITROSTAT) 0.4 MG SL tablet DISSOLVE ONE TABLET UNDER THE TONGUE EVERY 5 MINUTES AS NEEDED FOR CHEST PAIN.  DO NOT EXCEED A TOTAL OF 3 DOSES IN 15 MINUTES 25 tablet 3   potassium chloride SA (KLOR-CON M) 20 MEQ tablet Take 2 tablets (40 mEq total) by mouth daily.     spironolactone (ALDACTONE) 25 MG tablet Take 1/2 (one-half) tablet by mouth once daily 45 tablet 3   vitamin C (ASCORBIC ACID) 500 MG tablet Take 500 mg by mouth daily.     magnesium oxide (MAG-OX) 400 MG tablet Take 0.5 tablets (200 mg total) by mouth daily. (Patient not taking: Reported on 11/28/2023) 30 tablet 6   No current facility-administered medications for this visit.    Allergies   Allergies as of 11/28/2023 - Review Complete 11/28/2023  Allergen Reaction Noted   Afrin [oxymetazoline] Anaphylaxis 01/15/2021   Amoxicillin Anaphylaxis 06/01/2018   Flonase [fluticasone] Anaphylaxis 01/04/2022   Lisinopril Anaphylaxis 06/13/2015   Ranolazine Anaphylaxis 09/04/2020   Vicodin [hydrocodone-acetaminophen] Diarrhea and Nausea And Vomiting 04/25/2016   Demerol [meperidine] Nausea And Vomiting 01/17/2013   Lodine [etodolac] Rash 01/17/2013     Past Medical History   Past Medical History:  Diagnosis Date   Arthritis    Right knee   Atrial fibrillation (HCC)    CAD (coronary artery disease)    a. 06/07/15 NSTEMI/Cath: Dist LAD 100%, mid LAD 10%. Otw nl cors. Nl EF w/ inferoapical AK.   Essential hypertension    Hip pain    History of cardiomyopathy 04/23/2016   a. 04/2016 Echo: EF 45%,  Gr2 DD; b. 11/2019 Echo: EF 45-50%, mod LVH. Gr2 DD. Nl RV fxn. Sev dil LA.    Hyperlipidemia     Hypothyroidism    NSTEMI (non-ST elevated myocardial infarction) (HCC) 06/05/2015   Vitamin D deficiency     Past Surgical History   Past Surgical History:  Procedure Laterality Date   ABDOMINAL HYSTERECTOMY     partial- one ovary remaining   BIOPSY  09/17/2020   Procedure: BIOPSY;  Surgeon: Lanelle Bal, DO;  [facility];  Service: Endoscopy;;   CARDIAC CATHETERIZATION N/A 06/06/2015   Procedure: Left Heart Cath and Coronary Angiography;  Surgeon: Peter M Swaziland, MD;  Location: [facility] INVASIVE CV LAB;  Service: Cardiovascular;  Laterality: N/A;   CARDIOVERSION N/A 09/14/2020   Procedure: CARDIOVERSION;  Surgeon: Jonelle Sidle, MD;  Location: [facility];  Service: Cardiovascular;  Laterality: N/A;   CARDIOVERSION N/A 06/20/2022   Procedure: CARDIOVERSION;  Surgeon: Antoine Poche, MD;  Location: [facility];  Service: Endoscopy;  Laterality: N/A;   CARDIOVERSION N/A 01/01/2023   Procedure: CARDIOVERSION;  Surgeon: Jodelle Red, MD;  Location: [facility] ENDOSCOPY;  Service: Cardiovascular;  Laterality: N/A;   CHOLECYSTECTOMY     COLONOSCOPY     COLONOSCOPY N/A 08/13/2017   Procedure: COLONOSCOPY;  Surgeon: Corbin Ade, MD;  Location: [facility];  Service: Endoscopy;  Laterality: N/A;  8:30 AM   COLONOSCOPY WITH PROPOFOL N/A 01/18/2021   [name]: Multiple tubular adenomas removed, next colonoscopy in 3 years   ESOPHAGOGASTRODUODENOSCOPY N/A 09/11/2016   Procedure: ESOPHAGOGASTRODUODENOSCOPY (EGD);  Surgeon: Corbin Ade, MD;  [facility]: [facility];  Service: Endoscopy;  Laterality: N/A;  7:45 am - moved to 10/11 @ 10:30   ESOPHAGOGASTRODUODENOSCOPY (EGD) WITH PROPOFOL N/A 09/17/2020   [name]: Diffuse moderate inflammation characterized by erosions and erythema found in the entire stomach.  Biopsies positive for H. pylori.  Patient has not been treated due to drug allergies.   Heel spurs Left    MALONEY DILATION N/A 09/11/2016   Procedure: [name] DILATION;   Surgeon: Corbin Ade, MD;  [facility]: [facility];  Service: Endoscopy;  Laterality: N/A;   POLYPECTOMY  08/13/2017   Procedure: POLYPECTOMY;  Surgeon: Corbin Ade, MD;  [facility];  Service: Endoscopy;;  colon   POLYPECTOMY  01/18/2021   Procedure: POLYPECTOMY;  Surgeon: Corbin Ade, MD;  [facility]: [facility];  Service: Endoscopy;;   SHOULDER ARTHROSCOPY Right    TUBAL LIGATION      Past Family History   Family History  Problem Relation Age of Onset   Heart failure Father        Deceased   Heart attack Father        Deceased   Stroke Sister        Deceased   Heart failure Brother        Deceased   Cancer Brother        Deceased   Heart attack Brother        Deceased   COPD Daughter    Arthritis Daughter    Lupus Daughter    Colon cancer Neg Hx     Past Social History   Social History   Socioeconomic History   Marital status: Single    Spouse name: Not on file   Number of children: 3   Years of education: 10   Highest education level: Not on file  Occupational History   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   Social Drivers of Corporate investment banker Strain: High Risk (12/09/2022)   Overall Financial Resource Strain (CARDIA)    Difficulty of Paying Living Expenses: Hard  Food Insecurity: No Food Insecurity (12/09/2022)   Hunger Vital Sign     Worried About Running Out of Food in the Last Year: Never true    Ran Out of Food in the Last Year: Never true  Transportation Needs: No Transportation Needs (12/09/2022)   PRAPARE - Administrator, Civil Service (Medical): No    Lack of Transportation (Non-Medical): No  Physical Activity: Inactive (12/09/2022)   Exercise Vital Sign    Days of Exercise per Week: 0 days    Minutes of Exercise per Session: 0 min  Stress: No Stress Concern Present (12/09/2022)   [employer] of Occupational Health - Occupational Stress Questionnaire    Feeling of Stress : Not at all  Social Connections: Moderately Integrated (12/09/2022)   Social Connection and Isolation Panel [NHANES]    Frequency of Communication with Friends and Family: More than three times a week    Frequency of Social Gatherings with Friends and Family: Once a week    Attends Religious Services: 1 to 4 times per year    Active Member of [employer] or Organizations: No    Attends Banker Meetings: Never    Marital Status: Living with partner  Intimate Partner Violence: Not At Risk (12/09/2022)   Humiliation, Afraid, Rape, and Kick questionnaire    Fear of Current or Ex-Partner: No    Emotionally Abused: No    Physically Abused: No    Sexually Abused: No    Review of Systems   General: Negative for anorexia, weight loss, fever, chills, fatigue, weakness. ENT: Negative for hoarseness, difficulty swallowing , nasal congestion. CV: Negative for chest pain, angina, palpitations, dyspnea on exertion, peripheral edema.  Respiratory: Negative for dyspnea at rest, dyspnea on exertion, cough, sputum, wheezing.  GI: See history of present illness. GU:  Negative for dysuria, hematuria, urinary incontinence, urinary frequency, nocturnal urination.  Endo: Negative for unusual weight change.     Physical Exam   BP 132/77   Pulse 75   Temp 97.8 F (36.6 C)   Ht 5\' 3"  (1.6 m)   Wt 218 lb 9.6 oz (99.2 kg)   BMI 38.72  kg/m    General: Well-nourished, well-developed in no acute distress.  Eyes: No icterus. Mouth: Oropharyngeal mucosa moist and pink  Lungs: Clear to auscultation bilaterally.  Heart: Regular rate and rhythm, no murmurs rubs or gallops.  Abdomen: Bowel sounds are normal, nontender, nondistended, no hepatosplenomegaly or masses,  no abdominal bruits or hernia , no rebound or guarding.  Rectal: not performed  Extremities: No lower extremity edema. No clubbing  or deformities. Neuro: Alert and oriented x 4   Skin: Warm and dry, no jaundice.   Psych: Alert and cooperative, normal mood and affect.  Labs   Lab Results  Component Value Date   NA 139 11/20/2023   CL 104 11/20/2023   K 4.4 11/20/2023   CO2 22 11/20/2023   BUN 17 11/20/2023   CREATININE 1.10 (H) 11/20/2023   EGFR 54 (L) 11/20/2023   CALCIUM 8.9 11/20/2023   PHOS 3.1 06/19/2022   ALBUMIN 3.7 (L) 05/16/2023   GLUCOSE 88 11/20/2023   Lab Results  Component Value Date   ALT 22 05/16/2023   AST 24 05/16/2023   ALKPHOS 99 05/16/2023   BILITOT <0.2 05/16/2023   Lab Results  Component Value Date   WBC 6.4 11/20/2023   HGB 10.4 (L) 11/20/2023   HCT 35.4 11/20/2023   MCV 86 11/20/2023   PLT 351 11/20/2023   Lab Results  Component Value Date   IRON 114 11/20/2023   TIBC 343 11/20/2023   FERRITIN 18 11/20/2023    Imaging Studies   No results found.  Assessment/Plan:   H/O IDA: likely multifactorial in setting of CKD with possible occult GI bleeding in setting of chronic anticoagulation and Plavix. Denies overt GI bleeding. Last EGD in 2021 with H.pylori gastritis with confirmed eradication. Due for surveillance colonoscopy due to adenomatous colon polyps.  -colonoscopy in near future. ASA 3.  I have discussed the risks, alternatives, benefits with regards to but not limited to the risk of reaction to medication, bleeding, infection, perforation and the patient is agreeable to proceed. Written consent to be  obtained. -will will request ok to hold Eliquis 48 hours before procedure -she will need to hold Jardiance 72 hours. -hold iron 7 days before colonoscopy but otherwise should take at least 3-4 times per week  -we discussed possible EGD at time of colonoscopy but patient wanted to hold off for now. She is aware that it may be needed in the future if Hgb and iron drift downward.      Leanna Battles. Melvyn Neth, MHS, PA-C [facility]

## 2023-11-28 NOTE — Telephone Encounter (Signed)
   Patient Name: Maria Fry  DOB: 02-06-1951 MRN: [id number]  Primary Cardiologist: Nona Dell, MD  Clinical pharmacists have reviewed the patient's past medical history, labs, and current medications as part of preoperative protocol coverage. The following recommendations have been made:  Procedure: Colonoscopy  Date of procedure: TBD   CHA2DS2-VASc Score = 5   This indicates a 7.2% annual risk of stroke. The patient's score is based upon: CHF History: 1 HTN History: 1 Diabetes History: 0 Stroke History: 0 Vascular Disease History: 1 Age Score: 1 Gender Score: 1   CrCl 74 mL/min Platelet count 351    Per office protocol, patient can hold Eliquis for 2 days prior to procedure, please resume when safe to do so from a bleeding standpoint.  I will route this recommendation to the requesting party via Epic fax function and remove from pre-op pool.  Please call with questions.  Rip Harbour, NP 11/28/2023, 11:52 AM

## 2023-11-28 NOTE — Telephone Encounter (Signed)
  Request for patient to stop medication prior to procedure or is needing cleareance  11/28/23  Maria Fry 06-27-1951  What type of surgery is being performed? Colonoscopy  When is surgery scheduled? TBD  What type of clearance is required (medical or pharmacy to hold medication or both? medication  Are there any medications that need to be held prior to surgery and how long? Eliquis x 2 days  Name of physician performing surgery?  Dr.[name] [facility] [employer]: [phone] Fax: [phone]  Anethesia type (none, local, MAC, general)? MAC

## 2023-12-01 NOTE — Telephone Encounter (Signed)
Please advise. Thank you

## 2023-12-03 NOTE — Telephone Encounter (Signed)
 Ok to schedule. Hold eliquis 48 hours. Other orders as per encounter form

## 2023-12-04 NOTE — Telephone Encounter (Signed)
 LMTRC

## 2023-12-10 ENCOUNTER — Encounter: Payer: Self-pay | Admitting: *Deleted

## 2023-12-10 NOTE — Telephone Encounter (Signed)
LMTRC Mailed letter 

## 2023-12-23 ENCOUNTER — Ambulatory Visit: Payer: Medicare Other | Admitting: Nutrition

## 2023-12-26 ENCOUNTER — Ambulatory Visit: Payer: Medicare Other | Admitting: Cardiovascular Disease

## 2023-12-29 ENCOUNTER — Telehealth: Payer: Medicare Other | Admitting: Internal Medicine

## 2023-12-29 NOTE — Telephone Encounter (Signed)
Pt received letter that we were trying to reach her to schedule her colonoscopy with [facility]. Please call [phone]

## 2023-12-30 ENCOUNTER — Other Ambulatory Visit: Payer: Self-pay | Admitting: *Deleted

## 2023-12-30 ENCOUNTER — Encounter: Payer: Self-pay | Admitting: *Deleted

## 2023-12-30 MED ORDER — PEG 3350-KCL-NA BICARB-NACL 420 G PO SOLR: 4000.0000 mL | Freq: Once | ORAL | 0 refills | Status: AC

## 2023-12-30 NOTE — Telephone Encounter (Signed)
[employer] PA: Notification or Prior Authorization is not required for the requested services You are not required to submit a notification/prior authorization based on the information provided. If you have general questions about the prior authorization requirements, visit [email or web address] > Clinician Resources > Advance and Admission Notification Requirements. The number above acknowledges your notification. Please write this reference number down for future reference. If you would like to request an organization determination, please call us at [phone]. Decision ID #: [id number]

## 2023-12-30 NOTE — Telephone Encounter (Signed)
Pt has been scheduled for 02/13/24. Instructions mailed and prep sent to the pharmacy.

## 2024-01-13 ENCOUNTER — Other Ambulatory Visit: Payer: Self-pay

## 2024-01-13 ENCOUNTER — Other Ambulatory Visit: Payer: Self-pay | Admitting: "Endocrinology

## 2024-01-13 DIAGNOSIS — E66812 Obesity, class 2: Secondary | ICD-10-CM

## 2024-01-13 DIAGNOSIS — E890 Postprocedural hypothyroidism: Secondary | ICD-10-CM

## 2024-01-13 DIAGNOSIS — E559 Vitamin D deficiency, unspecified: Secondary | ICD-10-CM

## 2024-01-20 LAB — LIPID PANEL
Chol/HDL Ratio: 2.8 {ratio} (ref 0.0–4.4)
Cholesterol, Total: 111 mg/dL (ref 100–199)
HDL: 40 mg/dL (ref >39)
LDL Chol Calc (NIH): 58 mg/dL (ref 0–99)
Triglycerides: 57 mg/dL (ref 0–149)
VLDL Cholesterol Cal: 13 mg/dL (ref 5–40)

## 2024-01-20 LAB — T4, FREE: Free T4: 1.64 ng/dL (ref 0.82–1.77)

## 2024-01-20 LAB — TSH: TSH: 3.17 u[IU]/mL (ref 0.450–4.500)

## 2024-01-26 ENCOUNTER — Ambulatory Visit: Payer: Medicare Other | Admitting: "Endocrinology

## 2024-01-26 ENCOUNTER — Encounter: Payer: Self-pay | Admitting: "Endocrinology

## 2024-01-26 VITALS — BP 118/78 | HR 76 | Ht 63.0 in | Wt 216.6 lb

## 2024-01-26 DIAGNOSIS — E559 Vitamin D deficiency, unspecified: Secondary | ICD-10-CM

## 2024-01-26 DIAGNOSIS — E890 Postprocedural hypothyroidism: Secondary | ICD-10-CM | POA: Diagnosis not present

## 2024-01-26 NOTE — Progress Notes (Signed)
 01/26/2024   Endocrinology follow-up note   Subjective:    Patient ID: Maria Fry, female    DOB: 1951-09-14,    Past Medical History:  Diagnosis Date   Arthritis    Right knee   Atrial fibrillation (HCC)    CAD (coronary artery disease)    a. 06/07/15 NSTEMI/Cath: Dist LAD 100%, mid LAD 10%. Otw nl cors. Nl EF w/ inferoapical AK.   Essential hypertension    Hip pain    History of cardiomyopathy 04/23/2016   a. 04/2016 Echo: EF 45%, Gr2 DD; b. 11/2019 Echo: EF 45-50%, mod LVH. Gr2 DD. Nl RV fxn. Sev dil LA.    Hyperlipidemia    Hypothyroidism    NSTEMI (non-ST elevated myocardial infarction) (HCC) 06/05/2015   Vitamin D deficiency    Past Surgical History:  Procedure Laterality Date   ABDOMINAL HYSTERECTOMY     partial- one ovary remaining   BIOPSY  09/17/2020   Procedure: BIOPSY;  Surgeon: Lanelle Bal, DO;  Location: [facility];  Service: Endoscopy;;   CARDIAC CATHETERIZATION N/A 06/06/2015   Procedure: Left Heart Cath and Coronary Angiography;  Surgeon: Peter M Swaziland, MD;  Location: [facility] INVASIVE CV LAB;  Service: Cardiovascular;  Laterality: N/A;   CARDIOVERSION N/A 09/14/2020   Procedure: CARDIOVERSION;  Surgeon: Jonelle Sidle, MD;  Location: [facility];  Service: Cardiovascular;  Laterality: N/A;   CARDIOVERSION N/A 06/20/2022   Procedure: CARDIOVERSION;  Surgeon: Antoine Poche, MD;  Location: [facility];  Service: Endoscopy;  Laterality: N/A;   CARDIOVERSION N/A 01/01/2023   Procedure: CARDIOVERSION;  Surgeon: Jodelle Red, MD;  Location: [facility] ENDOSCOPY;  Service: Cardiovascular;  Laterality: N/A;   CHOLECYSTECTOMY     COLONOSCOPY     COLONOSCOPY N/A 08/13/2017   Procedure: COLONOSCOPY;  Surgeon: Corbin Ade, MD;  Location: [facility];  Service: Endoscopy;  Laterality: N/A;  8:30 AM   COLONOSCOPY WITH PROPOFOL N/A 01/18/2021   [name]: Multiple tubular adenomas removed, next colonoscopy in 3 years   ESOPHAGOGASTRODUODENOSCOPY N/A 09/11/2016    Procedure: ESOPHAGOGASTRODUODENOSCOPY (EGD);  Surgeon: Corbin Ade, MD;  Location: [facility];  Service: Endoscopy;  Laterality: N/A;  7:45 am - moved to 10/11 @ 10:30   ESOPHAGOGASTRODUODENOSCOPY (EGD) WITH PROPOFOL N/A 09/17/2020   [name]: Diffuse moderate inflammation characterized by erosions and erythema found in the entire stomach.  Biopsies positive for H. pylori.  Patient has not been treated due to drug allergies.   Heel spurs Left    MALONEY DILATION N/A 09/11/2016   Procedure: [name] DILATION;  Surgeon: Corbin Ade, MD;  Location: [facility];  Service: Endoscopy;  Laterality: N/A;   POLYPECTOMY  08/13/2017   Procedure: POLYPECTOMY;  Surgeon: Corbin Ade, MD;  Location: [facility];  Service: Endoscopy;;  colon   POLYPECTOMY  01/18/2021   Procedure: POLYPECTOMY;  Surgeon: Corbin Ade, MD;  Location: [facility];  Service: Endoscopy;;   SHOULDER ARTHROSCOPY Right    TUBAL LIGATION     Social History   Socioeconomic History   Marital status: Single    Spouse name: Not on file   Number of children: 3   Years of education: 10   Highest education level: Not on file  Occupational History   Occupation: team leader at [employer]  Tobacco Use   Smoking status: Former    Current packs/day: 0.00    Average packs/day: 1 pack/day for 25.0 years (25.0 ttl pk-yrs)    Types:  Cigarettes    Start date: 12/02/1977    Quit date: 12/02/2002    Years since quitting: 21.1   Smokeless tobacco: Never   Tobacco comments:    Former smoker 05/10/22  Vaping Use   Vaping status: Never Used  Substance and Sexual Activity   Alcohol use: No    Alcohol/week: 0.0 standard drinks of alcohol   Drug use: No   Sexual activity: Not Currently  Other Topics Concern   Not on file  Social History Narrative   Retired from [employer].   Social Drivers of Corporate investment banker Strain: High Risk (12/09/2022)   Overall Financial Resource Strain (CARDIA)    Difficulty of  Paying Living Expenses: Hard  Food Insecurity: No Food Insecurity (12/09/2022)   Hunger Vital Sign    Worried About Running Out of Food in the Last Year: Never true    Ran Out of Food in the Last Year: Never true  Transportation Needs: No Transportation Needs (12/09/2022)   PRAPARE - Administrator, Civil Service (Medical): No    Lack of Transportation (Non-Medical): No  Physical Activity: Inactive (12/09/2022)   Exercise Vital Sign    Days of Exercise per Week: 0 days    Minutes of Exercise per Session: 0 min  Stress: No Stress Concern Present (12/09/2022)   [employer] of Occupational Health - Occupational Stress Questionnaire    Feeling of Stress : Not at all  Social Connections: Moderately Integrated (12/09/2022)   Social Connection and Isolation Panel [NHANES]    Frequency of Communication with Friends and Family: More than three times a week    Frequency of Social Gatherings with Friends and Family: Once a week    Attends Religious Services: 1 to 4 times per year    Active Member of [employer] or Organizations: No    Attends Banker Meetings: Never    Marital Status: Living with partner   Outpatient Encounter Medications as of 01/26/2024  Medication Sig   acetaminophen (TYLENOL) 500 MG tablet Take 500 mg by mouth 2 (two) times daily.   amLODipine (NORVASC) 2.5 MG tablet Take 1 tablet by mouth once daily   apixaban (ELIQUIS) 5 MG TABS tablet Take 1 tablet by mouth twice daily   atorvastatin (LIPITOR) 80 MG tablet TAKE 1 TABLET BY MOUTH AT BEDTIME   calcium carbonate (OS-CAL - DOSED IN MG OF ELEMENTAL CALCIUM) 1250 (500 Ca) MG tablet Take 500 mg by mouth daily.   Cholecalciferol (VITAMIN D3) 50 MCG (2000 UT) capsule Take 1 capsule (2,000 Units total) by mouth daily.   clopidogrel (PLAVIX) 75 MG tablet Take 75 mg by mouth daily.   diclofenac Sodium (VOLTAREN) 1 % GEL Apply 1 application topically 4 (four) times daily as needed (pain).   ergocalciferol (VITAMIN D2)  1.25 MG (50000 UT) capsule Take 50,000 Units by mouth once a week.   ferrous sulfate 325 (65 FE) MG EC tablet Take 1 tablet (325 mg total) by mouth daily. (Patient taking differently: Take 325 mg by mouth 4 (four) times a week.)   furosemide (LASIX) 40 MG tablet Take 1 tablet (40 mg total) by mouth daily.   gabapentin (NEURONTIN) 300 MG capsule Take 1 capsule by mouth twice daily   levothyroxine (SYNTHROID) 100 MCG tablet Take 1 tablet (100 mcg total) by mouth daily before breakfast.   MAGnesium-Oxide 400 (240 Mg) MG tablet TAKE 1/2 (ONE-HALF) TABLET BY MOUTH DAILY   melatonin 3 MG TABS tablet Take 3 mg  by mouth at bedtime as needed (Sleep).   nitroGLYCERIN (NITROSTAT) 0.4 MG SL tablet DISSOLVE ONE TABLET UNDER THE TONGUE EVERY 5 MINUTES AS NEEDED FOR CHEST PAIN.  DO NOT EXCEED A TOTAL OF 3 DOSES IN 15 MINUTES   potassium chloride SA (KLOR-CON M) 20 MEQ tablet Take 2 tablets (40 mEq total) by mouth daily.   spironolactone (ALDACTONE) 25 MG tablet Take 1/2 (one-half) tablet by mouth once daily   vitamin C (ASCORBIC ACID) 500 MG tablet Take 500 mg by mouth daily.   [DISCONTINUED] empagliflozin (JARDIANCE) 10 MG TABS tablet Take 1 tablet (10 mg total) by mouth daily before breakfast. (Patient not taking: Reported on 01/26/2024)   [DISCONTINUED] magnesium oxide (MAG-OX) 400 MG tablet Take 0.5 tablets (200 mg total) by mouth daily. (Patient not taking: Reported on 11/28/2023)   No facility-administered encounter medications on file as of 01/26/2024.   ALLERGIES: Allergies  Allergen Reactions   Afrin [Oxymetazoline] Anaphylaxis   Amoxicillin Anaphylaxis   Flonase [Fluticasone] Anaphylaxis   Lisinopril Anaphylaxis   Ranolazine Anaphylaxis    Throat swells, Dizzy, Reaction occurred with generic med   Vicodin [Hydrocodone-Acetaminophen] Diarrhea and Nausea And Vomiting   Demerol [Meperidine] Nausea And Vomiting   Lodine [Etodolac] Rash   VACCINATION STATUS: Immunization History  Administered  Date(s) Administered   Fluad Quad(high Dose 65+) 10/21/2019, 08/18/2020, 09/05/2021, 09/19/2022   Fluad Trivalent(High Dose 65+) 08/25/2023   Influenza, High Dose Seasonal PF 08/24/2018   Influenza,inj,Quad PF,6+ Mos 10/02/2015, 08/19/2016, 08/25/2017, 09/02/2019   PFIZER(Purple Top)SARS-COV-2 Vaccination 01/24/2020, 02/14/2020, 10/31/2020, 06/29/2021   Pneumococcal Conjugate-13 01/01/2019   Pneumococcal Polysaccharide-23 01/03/2020   Tdap 05/13/2018    Thyroid Problem Presents for follow-up (She was given RAI therapy for Graves' disease in the middle of September 2016.  She is currently on levothyroxine 100 mcg for RAI induced for thyroidism.  nduced hypothyroidism.) visit. Patient reports no cold intolerance, diarrhea, fatigue, heat intolerance or palpitations. The symptoms have been improving (She denies palpitations, tremors, heat/cold intolerance.   She denies dysphagia or shortness of breath, nor voice change.).   Review of systems : Limited as above.  Objective:    BP 118/78   Pulse 76   Ht 5\' 3"  (1.6 m)   Wt 216 lb 9.6 oz (98.2 kg)   BMI 38.37 kg/m   Wt Readings from Last 3 Encounters:  01/26/24 216 lb 9.6 oz (98.2 kg)  11/28/23 218 lb 9.6 oz (99.2 kg)  11/20/23 214 lb (97.1 kg)      Results for orders placed or performed in visit on 01/13/24  Lipid Panel   Collection Time: 01/19/24  9:24 AM  Result Value Ref Range   Cholesterol, Total 111 100 - 199 mg/dL   Triglycerides 57 0 - 149 mg/dL   HDL 40 >16 mg/dL   VLDL Cholesterol Cal 13 5 - 40 mg/dL   LDL Chol Calc (NIH) 58 0 - 99 mg/dL   Chol/HDL Ratio 2.8 0.0 - 4.4 ratio  T4, Free   Collection Time: 01/19/24  9:24 AM  Result Value Ref Range   Free T4 1.64 0.82 - 1.77 ng/dL  TSH   Collection Time: 01/19/24  9:24 AM  Result Value Ref Range   TSH 3.170 0.450 - 4.500 uIU/mL     Assessment & Plan:   1.  RAI induced Hypothyroidism:    2.  Vitamin D deficiency   She is status post RAI therapy in September 2016 For  hyperthyroidism. -Her previsit thyroid function tests are consistent with  appropriate replacement.  She is advised to continue levothyroxine 100 mcg p.o. daily before breakfast.    - We discussed about the correct intake of her thyroid hormone, on empty stomach at fasting, with water, separated by at least 30 minutes from breakfast and other medications,  and separated by more than 4 hours from calcium, iron, multivitamins, acid reflux medications (PPIs). -Patient is made aware of the fact that thyroid hormone replacement is needed for life, dose to be adjusted by periodic monitoring of thyroid function tests. -he is advised to resume and continue her calcium carbonate 500 mg p.o. once a day at lunch or supper.  Regarding her vitamin D deficiency, she admits that she has not been consistent taking her vitamin D3.  She is advised to be consistent and take her vitamin D3 2000 units daily.   I advised patient to maintain close follow up with her PCP for primary care needs.   I spent  21  minutes in the care of the patient today including review of labs from Thyroid Function, CMP, and other relevant labs ; imaging/biopsy records (current and previous including abstractions from other facilities); face-to-face time discussing  her lab results and symptoms, medications doses, her options of short and long term treatment based on the latest standards of care / guidelines;   and documenting the encounter.  Maria Fry  participated in the discussions, expressed understanding, and voiced agreement with the above plans.  All questions were answered to her satisfaction. she is encouraged to contact clinic should she have any questions or concerns prior to her return visit.   Follow up plan: Return in about 1 year (around 01/25/2025) for F/U with Pre-visit Labs.  Marquis Lunch, MD Phone: [phone]  Fax: [phone]  -  This note was partially dictated with voice recognition software. Similar sounding words  can be transcribed inadequately or may not  be corrected upon review.  01/26/2024, 10:07 AM

## 2024-01-27 ENCOUNTER — Other Ambulatory Visit: Payer: Self-pay | Admitting: Internal Medicine

## 2024-01-27 ENCOUNTER — Other Ambulatory Visit: Payer: Self-pay | Admitting: Cardiology

## 2024-01-27 DIAGNOSIS — E785 Hyperlipidemia, unspecified: Secondary | ICD-10-CM

## 2024-02-02 ENCOUNTER — Encounter: Payer: Self-pay | Admitting: Cardiovascular Disease

## 2024-02-02 ENCOUNTER — Ambulatory Visit: Payer: Medicare Other | Attending: Cardiovascular Disease | Admitting: Cardiovascular Disease

## 2024-02-02 VITALS — BP 120/76 | HR 60 | Ht 63.0 in | Wt 218.2 lb

## 2024-02-02 DIAGNOSIS — I5032 Chronic diastolic (congestive) heart failure: Secondary | ICD-10-CM

## 2024-02-02 DIAGNOSIS — I4892 Unspecified atrial flutter: Secondary | ICD-10-CM | POA: Diagnosis not present

## 2024-02-02 DIAGNOSIS — I4819 Other persistent atrial fibrillation: Secondary | ICD-10-CM | POA: Diagnosis not present

## 2024-02-02 NOTE — Progress Notes (Signed)
  Electrophysiology Office Note:    Date:  02/02/2024   ID:  Maria Fry, DOB Jul 29, 1951, MRN [id number]  PCP:  Billie Lade, MD   [facility] Providers Cardiologist:  Nona Dell, MD Electrophysiologist:  Maurice Small, MD     Referring MD: Billie Lade, MD   History of Present Illness:    Maria Fry is a 73 y.o. female with a hx listed below, significant for CAD, chronic combined systolic and diastolic CHF, OSA, hypothyroid, persistent atrial fibrillation referred for arrhythmia management.  She was initially diagnosed with atrial fibrillation in Sept 2021.  She has been managed with Tikosyn but has had episodes of breakthrough.  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  She has fatigue and shortness of breath with exertion.  No significant chest pain, dependent edema.  She underwent DCCV in January, 2024. She maintained sinus rhythm for some time but has had recurrence of atypical flutter on ECG today. She reports that she feels well and was unaware that her flutter had recurred.  She continues to feel very short of breath and have poor exertional tolerance.  She can only go up 3-4 steps before she is completely out of breath.   EKGs/Labs/Other Studies Reviewed Today:     TTE: EF 55%, severe LVH  EKG:  Last EKG results: atypical atrial flutter, V-rate 54 BPM   Recent Labs: 05/16/2023: ALT 22 11/20/2023: BUN 17; Creatinine, Ser 1.10; Hemoglobin 10.4; Platelets 351; Potassium 4.4; Sodium 139 01/19/2024: TSH 3.170     Physical Exam:    VS:  BP 120/76 (BP Location: Right Arm, Patient Position: Sitting, Cuff Size: Large)   Pulse 60   Ht 5\' 3"  (1.6 m)   Wt 218 lb 3.2 oz (99 kg)   SpO2 95%   BMI 38.65 kg/m     Wt Readings from Last 3 Encounters:  02/02/24 218 lb 3.2 oz (99 kg)  01/26/24 216 lb 9.6 oz (98.2 kg)  11/28/23 218 lb 9.6 oz (99.2 kg)    GEN:  Well nourished, well developed in no acute distress CARDIAC: iRRR, no murmurs,  rubs, gallops RESPIRATORY:  Normal work of breathing MUSCULOSKELETAL: no edema    ASSESSMENT & PLAN:    Longstanding persistent atrial fibrillation/atypical atrial flutter:  Pt had recurrence of flutter on amiodarone  LA size 5.1 cm in 2023 In the past, I have urged her to be a poor candidate for ablation due to BMI and appearance of atypical flutter; however, with the improved risk-benefit ratio of pulsed field ablation I would consider proceeding with ablation --particularly if she is able to lose weight She will follow-up in 6 months.  If she has lost a significant amount of weight in that time, we can discuss scheduling A-fib ablation.  If not, we may need to consider assessing chronotropic competence as a possible cause for her fatigue.  Secondary hypercoagulable state: CHADs2Vasc 5.   CHFrEF EF recovered. 55%        Medication Adjustments/Labs and Tests Ordered: Current medicines are reviewed at length with the patient today.  Concerns regarding medicines are outlined above.  Orders Placed This Encounter  Procedures   EKG 12-Lead   No orders of the defined types were placed in this encounter.    Signed, Maurice Small, MD  02/02/2024 10:17 AM    [facility]

## 2024-02-02 NOTE — Patient Instructions (Signed)
 Medication Instructions:  Your physician recommends that you continue on your current medications as directed. Please refer to the Current Medication list given to you today. *If you need a refill on your cardiac medications before your next appointment, please call your pharmacy*   Follow-Up: At [facility], you and your health needs are our priority.  As part of our continuing mission to provide you with exceptional heart care, we have created designated Provider Care Teams.  These Care Teams include your primary Cardiologist (physician) and Advanced Practice Providers (APPs -  Physician Assistants and Nurse Practitioners) who all work together to provide you with the care you need, when you need it.  We recommend signing up for the patient portal called "MyChart".  Sign up information is provided on this After Visit Summary.  MyChart is used to connect with patients for Virtual Visits (Telemedicine).  Patients are able to view lab/test results, encounter notes, upcoming appointments, etc.  Non-urgent messages can be sent to your provider as well.   To learn more about what you can do with MyChart, go to [email or web address].    Your next appointment:   6 month(s)  Provider:   York Pellant, MD

## 2024-02-10 NOTE — Patient Instructions (Signed)
 Maria Fry  02/10/2024     @PREFPERIOPPHARMACY @  Your procedure is scheduled on  02/13/2024.    Report to [facility] at  [phone]  A.M.    Call this number if you have problems the morning of surgery:  [phone]  If you experience any cold or flu symptoms such as cough, fever, chills, shortness of breath, etc. between now and your scheduled surgery, please notify us at the above number.   Remember:        Your last dose of iron should have been on 02/05/2024.         Your last dose of jardiance should have been on 02/09/2024.        Your last dose of eliquis should have been on 02/10/2024.   Follow the diet and prep instructions given to you by the office.   You may drink clear liquids until 0400 am on 02/13/2024.    Clear liquids allowed are:                    Water, Juice (No red color; non-citric and without pulp; diabetics please choose diet or no sugar options), Carbonated beverages (diabetics please choose diet or no sugar options), Clear Tea (No creamer, milk, or cream, including half & half and powdered creamer), Black Coffee Only (No creamer, milk or cream, including half & half and powdered creamer), Plain Jell-O Only (No red color; diabetics please choose no sugar options), Clear Sports drink (No red color; diabetics please choose diet or no sugar options), and Plain Popsicles Only (No red color; diabetics please choose no sugar options)    Take these medicines the morning of surgery with A SIP OF WATER                     amlodipine, gabapentin, levothyroxine.    Do not wear jewelry, make-up or nail polish, including gel polish,  artificial nails, or any other type of covering on natural nails (fingers and  toes).  Do not wear lotions, powders, or perfumes, or deodorant.  Do not shave 48 hours prior to surgery.  Men may shave face and neck.  Do not bring valuables to the hospital.  [facility] is not responsible for any belongings or  valuables.  Contacts, dentures or bridgework may not be worn into surgery.  Leave your suitcase in the car.  After surgery it may be brought to your room.  For patients admitted to the hospital, discharge time will be determined by your treatment team.  Patients discharged the day of surgery will not be allowed to drive home and must have someone with them for 24 hours.    Special instructions:   DO NOT smoke tobacco or vape for 24 hours before your procedure.  Please read over the following fact sheets that you were given. Anesthesia Post-op Instructions and Care and Recovery After Surgery      Colonoscopy, Adult, Care After The following information offers guidance on how to care for yourself after your procedure. Your health care provider may also give you more specific instructions. If you have problems or questions, contact your health care provider. What can I expect after the procedure? After the procedure, it is common to have: A small amount of blood in your stool for 24 hours after the procedure. Some gas. Mild cramping or bloating of your abdomen. Follow these instructions at home: Eating and drinking  Drink  enough fluid to keep your urine pale yellow. Follow instructions from your health care provider about eating or drinking restrictions. Resume your normal diet as told by your health care provider. Avoid heavy or fried foods that are hard to digest. Activity Rest as told by your health care provider. Avoid sitting for a long time without moving. Get up to take short walks every 1-2 hours. This is important to improve blood flow and breathing. Ask for help if you feel weak or unsteady. Return to your normal activities as told by your health care provider. Ask your health care provider what activities are safe for you. Managing cramping and bloating  Try walking around when you have cramps or feel bloated. If directed, apply heat to your abdomen as told by your health  care provider. Use the heat source that your health care provider recommends, such as a moist heat pack or a heating pad. Place a towel between your skin and the heat source. Leave the heat on for 20-30 minutes. Remove the heat if your skin turns bright red. This is especially important if you are unable to feel pain, heat, or cold. You have a greater risk of getting burned. General instructions If you were given a sedative during the procedure, it can affect you for several hours. Do not drive or operate machinery until your health care provider says that it is safe. For the first 24 hours after the procedure: Do not sign important documents. Do not drink alcohol. Do your regular daily activities at a slower pace than normal. Eat soft foods that are easy to digest. Take over-the-counter and prescription medicines only as told by your health care provider. Keep all follow-up visits. This is important. Contact a health care provider if: You have blood in your stool 2-3 days after the procedure. Get help right away if: You have more than a small spotting of blood in your stool. You have large blood clots in your stool. You have swelling of your abdomen. You have nausea or vomiting. You have a fever. You have increasing pain in your abdomen that is not relieved with medicine. These symptoms may be an emergency. Get help right away. Call 911. Do not wait to see if the symptoms will go away. Do not drive yourself to the hospital. Summary After the procedure, it is common to have a small amount of blood in your stool. You may also have mild cramping and bloating of your abdomen. If you were given a sedative during the procedure, it can affect you for several hours. Do not drive or operate machinery until your health care provider says that it is safe. Get help right away if you have a lot of blood in your stool, nausea or vomiting, a fever, or increased pain in your abdomen. This information  is not intended to replace advice given to you by your health care provider. Make sure you discuss any questions you have with your health care provider. Document Revised: 12/31/2022 Document Reviewed: 07/11/2021 Elsevier Patient Education  2024 [employer]General Anesthesia, Adult, Care After The following information offers guidance on how to care for yourself after your procedure. Your health care provider may also give you more specific instructions. If you have problems or questions, contact your health care provider. What can I expect after the procedure? After the procedure, it is common for people to: Have pain or discomfort at the IV site. Have nausea or vomiting. Have a sore throat or hoarseness. Have trouble  concentrating. Feel cold or chills. Feel weak, sleepy, or tired (fatigue). Have soreness and body aches. These can affect parts of the body that were not involved in surgery. Follow these instructions at home: For the time period you were told by your health care provider:  Rest. Do not participate in activities where you could fall or become injured. Do not drive or use machinery. Do not drink alcohol. Do not take sleeping pills or medicines that cause drowsiness. Do not make important decisions or sign legal documents. Do not take care of children on your own. General instructions Drink enough fluid to keep your urine pale yellow. If you have sleep apnea, surgery and certain medicines can increase your risk for breathing problems. Follow instructions from your health care provider about wearing your sleep device: Anytime you are sleeping, including during daytime naps. While taking prescription pain medicines, sleeping medicines, or medicines that make you drowsy. Return to your normal activities as told by your health care provider. Ask your health care provider what activities are safe for you. Take over-the-counter and prescription medicines only as told by your  health care provider. Do not use any products that contain nicotine or tobacco. These products include cigarettes, chewing tobacco, and vaping devices, such as e-cigarettes. These can delay incision healing after surgery. If you need help quitting, ask your health care provider. Contact a health care provider if: You have nausea or vomiting that does not get better with medicine. You vomit every time you eat or drink. You have pain that does not get better with medicine. You cannot urinate or have bloody urine. You develop a skin rash. You have a fever. Get help right away if: You have trouble breathing. You have chest pain. You vomit blood. These symptoms may be an emergency. Get help right away. Call 911. Do not wait to see if the symptoms will go away. Do not drive yourself to the hospital. Summary After the procedure, it is common to have a sore throat, hoarseness, nausea, vomiting, or to feel weak, sleepy, or fatigue. For the time period you were told by your health care provider, do not drive or use machinery. Get help right away if you have difficulty breathing, have chest pain, or vomit blood. These symptoms may be an emergency. This information is not intended to replace advice given to you by your health care provider. Make sure you discuss any questions you have with your health care provider. Document Revised: 02/15/2022 Document Reviewed: 02/15/2022 Elsevier Patient Education  2024 [employer].

## 2024-02-11 ENCOUNTER — Encounter (HOSPITAL_COMMUNITY): Payer: Self-pay

## 2024-02-11 ENCOUNTER — Encounter (HOSPITAL_COMMUNITY)
Admission: RE | Admit: 2024-02-11 | Discharge: 2024-02-11 | Disposition: A | Discharge status: Home / Self Care | Payer: Medicare Other | Source: Ambulatory Visit | Attending: Internal Medicine | Admitting: Internal Medicine

## 2024-02-11 ENCOUNTER — Other Ambulatory Visit: Payer: Self-pay

## 2024-02-11 VITALS — BP 120/76 | HR 60 | Temp 98.0°F | Resp 18 | Ht 63.0 in | Wt 218.2 lb

## 2024-02-11 DIAGNOSIS — R7302 Impaired glucose tolerance (oral): Secondary | ICD-10-CM | POA: Diagnosis not present

## 2024-02-11 DIAGNOSIS — N1831 Chronic kidney disease, stage 3a: Secondary | ICD-10-CM | POA: Diagnosis not present

## 2024-02-11 DIAGNOSIS — D631 Anemia in chronic kidney disease: Secondary | ICD-10-CM | POA: Insufficient documentation

## 2024-02-11 DIAGNOSIS — D508 Other iron deficiency anemias: Secondary | ICD-10-CM

## 2024-02-11 DIAGNOSIS — Z01812 Encounter for preprocedural laboratory examination: Secondary | ICD-10-CM | POA: Diagnosis not present

## 2024-02-11 DIAGNOSIS — Z01818 Encounter for other preprocedural examination: Secondary | ICD-10-CM | POA: Diagnosis present

## 2024-02-11 HISTORY — DX: Dorsalgia, unspecified: M54.9

## 2024-02-11 LAB — CBC WITH DIFFERENTIAL/PLATELET
Abs Immature Granulocytes: 0.02 10*3/uL (ref 0.00–0.07)
Basophils Absolute: 0 10*3/uL (ref 0.0–0.1)
Basophils Relative: 1 %
Eosinophils Absolute: 0.1 10*3/uL (ref 0.0–0.5)
Eosinophils Relative: 1 %
HCT: 36.6 % (ref 36.0–46.0)
Hemoglobin: 11.4 g/dL — ABNORMAL LOW (ref 12.0–15.0)
Immature Granulocytes: 0 %
Lymphocytes Relative: 30 %
Lymphs Abs: 1.8 10*3/uL (ref 0.7–4.0)
MCH: 26.5 pg (ref 26.0–34.0)
MCHC: 31.1 g/dL (ref 30.0–36.0)
MCV: 85.1 fL (ref 80.0–100.0)
Monocytes Absolute: 0.4 10*3/uL (ref 0.1–1.0)
Monocytes Relative: 7 %
Neutro Abs: 3.7 10*3/uL (ref 1.7–7.7)
Neutrophils Relative %: 61 %
Platelets: 302 10*3/uL (ref 150–400)
RBC: 4.3 MIL/uL (ref 3.87–5.11)
RDW: 22.5 % — ABNORMAL HIGH (ref 11.5–15.5)
Smear Review: ADEQUATE
WBC: 6 10*3/uL (ref 4.0–10.5)
nRBC: 0 % (ref 0.0–0.2)

## 2024-02-11 LAB — BASIC METABOLIC PANEL
Anion gap: 9 (ref 5–15)
BUN: 14 mg/dL (ref 8–23)
CO2: 25 mmol/L (ref 22–32)
Calcium: 8.7 mg/dL — ABNORMAL LOW (ref 8.9–10.3)
Chloride: 103 mmol/L (ref 98–111)
Creatinine, Ser: 1.03 mg/dL — ABNORMAL HIGH (ref 0.44–1.00)
GFR, Estimated: 58 mL/min — ABNORMAL LOW (ref >60)
Glucose, Bld: 107 mg/dL — ABNORMAL HIGH (ref 70–99)
Potassium: 3.8 mmol/L (ref 3.5–5.1)
Sodium: 137 mmol/L (ref 135–145)

## 2024-02-13 ENCOUNTER — Ambulatory Visit (HOSPITAL_COMMUNITY): Admitting: Certified Registered Nurse Anesthetist

## 2024-02-13 ENCOUNTER — Encounter (HOSPITAL_COMMUNITY): Payer: Self-pay | Admitting: Internal Medicine

## 2024-02-13 ENCOUNTER — Other Ambulatory Visit: Payer: Self-pay

## 2024-02-13 ENCOUNTER — Encounter (HOSPITAL_COMMUNITY)
Admission: RE | Disposition: A | Discharge status: Home / Self Care | Payer: Self-pay | Source: Home / Self Care | Attending: Internal Medicine

## 2024-02-13 ENCOUNTER — Ambulatory Visit (HOSPITAL_COMMUNITY)
Admission: RE | Admit: 2024-02-13 | Discharge: 2024-02-13 | Disposition: A | Discharge status: Home / Self Care | Payer: Medicare Other | Attending: Internal Medicine | Admitting: Internal Medicine

## 2024-02-13 DIAGNOSIS — I509 Heart failure, unspecified: Secondary | ICD-10-CM | POA: Diagnosis not present

## 2024-02-13 DIAGNOSIS — Z860101 Personal history of adenomatous and serrated colon polyps: Secondary | ICD-10-CM | POA: Diagnosis not present

## 2024-02-13 DIAGNOSIS — D123 Benign neoplasm of transverse colon: Secondary | ICD-10-CM | POA: Diagnosis not present

## 2024-02-13 DIAGNOSIS — I252 Old myocardial infarction: Secondary | ICD-10-CM | POA: Diagnosis not present

## 2024-02-13 DIAGNOSIS — Z860100 Personal history of colon polyps, unspecified: Secondary | ICD-10-CM

## 2024-02-13 DIAGNOSIS — K621 Rectal polyp: Secondary | ICD-10-CM

## 2024-02-13 DIAGNOSIS — I110 Hypertensive heart disease with heart failure: Secondary | ICD-10-CM | POA: Diagnosis not present

## 2024-02-13 DIAGNOSIS — Z1211 Encounter for screening for malignant neoplasm of colon: Secondary | ICD-10-CM | POA: Diagnosis not present

## 2024-02-13 DIAGNOSIS — Z87891 Personal history of nicotine dependence: Secondary | ICD-10-CM | POA: Diagnosis not present

## 2024-02-13 DIAGNOSIS — D509 Iron deficiency anemia, unspecified: Secondary | ICD-10-CM | POA: Diagnosis present

## 2024-02-13 DIAGNOSIS — D125 Benign neoplasm of sigmoid colon: Secondary | ICD-10-CM

## 2024-02-13 DIAGNOSIS — E039 Hypothyroidism, unspecified: Secondary | ICD-10-CM | POA: Insufficient documentation

## 2024-02-13 DIAGNOSIS — K219 Gastro-esophageal reflux disease without esophagitis: Secondary | ICD-10-CM | POA: Insufficient documentation

## 2024-02-13 DIAGNOSIS — I25119 Atherosclerotic heart disease of native coronary artery with unspecified angina pectoris: Secondary | ICD-10-CM | POA: Diagnosis not present

## 2024-02-13 DIAGNOSIS — I2510 Atherosclerotic heart disease of native coronary artery without angina pectoris: Secondary | ICD-10-CM

## 2024-02-13 DIAGNOSIS — N289 Disorder of kidney and ureter, unspecified: Secondary | ICD-10-CM | POA: Insufficient documentation

## 2024-02-13 DIAGNOSIS — G4730 Sleep apnea, unspecified: Secondary | ICD-10-CM | POA: Insufficient documentation

## 2024-02-13 DIAGNOSIS — Z5986 Financial insecurity: Secondary | ICD-10-CM | POA: Insufficient documentation

## 2024-02-13 DIAGNOSIS — Q438 Other specified congenital malformations of intestine: Secondary | ICD-10-CM | POA: Insufficient documentation

## 2024-02-13 DIAGNOSIS — G709 Myoneural disorder, unspecified: Secondary | ICD-10-CM | POA: Diagnosis not present

## 2024-02-13 HISTORY — PX: COLONOSCOPY WITH PROPOFOL: SHX5780

## 2024-02-13 HISTORY — PX: POLYPECTOMY: SHX5525

## 2024-02-13 SURGERY — COLONOSCOPY WITH PROPOFOL
Anesthesia: General

## 2024-02-13 MED ORDER — LACTATED RINGERS IV SOLN: INTRAVENOUS | Status: DC | PRN

## 2024-02-13 MED ORDER — PHENYLEPHRINE 80 MCG/ML (10ML) SYRINGE FOR IV PUSH (FOR BLOOD PRESSURE SUPPORT)
PREFILLED_SYRINGE | INTRAVENOUS | Status: AC
  Filled 2024-02-13: qty 10

## 2024-02-13 MED ORDER — LACTATED RINGERS IV SOLN: INTRAVENOUS | Status: DC

## 2024-02-13 MED ORDER — PROPOFOL 10 MG/ML IV BOLUS
INTRAVENOUS | Status: DC | PRN
  Administered 2024-02-13: 150 ug/kg/min via INTRAVENOUS
  Administered 2024-02-13: 60 mg via INTRAVENOUS

## 2024-02-13 MED ORDER — VASOPRESSIN 20 UNIT/ML IV SOLN
INTRAVENOUS | Status: DC | PRN
Start: 2024-02-13 — End: 2024-02-13
  Administered 2024-02-13 (×3): .5 [IU] via INTRAVENOUS

## 2024-02-13 MED ORDER — PROPOFOL 500 MG/50ML IV EMUL
INTRAVENOUS | Status: AC
  Filled 2024-02-13: qty 50

## 2024-02-13 NOTE — Transfer of Care (Signed)
 Immediate Anesthesia Transfer of Care Note  Patient: Maria Fry  Procedure(s) Performed: COLONOSCOPY WITH PROPOFOL POLYPECTOMY  Patient Location: Short Stay  Anesthesia Type:General  Level of Consciousness: oriented, drowsy, patient cooperative, and responds to stimulation  Airway & Oxygen Therapy: Patient Spontanous Breathing  Post-op Assessment: Report given to RN, Post -op Vital signs reviewed and stable, Patient moving all extremities X 4, and Patient able to stick tongue midline  Post vital signs: Reviewed and stable  Last Vitals:  Vitals Value Taken Time  BP 97/57 02/13/24 0846  Temp 97.7   Pulse 72 02/13/24 0847  Resp 21 02/13/24 0847  SpO2 98 % 02/13/24 0847  Vitals shown include unfiled device data.  Last Pain:  Vitals:   02/13/24 0812  TempSrc:   PainSc: 0-No pain         Complications: No notable events documented.

## 2024-02-13 NOTE — Op Note (Signed)
 [facility] Patient Name: Maria Fry Procedure Date: 02/13/2024 7:54 AM MRN: [id number] Date of Birth: 01-16-1951 Attending MD: Gennette Pac , MD, [id number] CSN: [id number] Age: 73 Admit Type: Outpatient Procedure:                Colonoscopy Indications:              Iron deficiency anemia Providers:                Gennette Pac, MD, Crystal Page, Elinor Parkinson Referring MD:              Medicines:                Propofol per Anesthesia Complications:            No immediate complications. Estimated Blood Loss:     Estimated blood loss was minimal. Procedure:                Pre-Anesthesia Assessment:                           - Prior to the procedure, a History and Physical                            was performed, and patient medications and                            allergies were reviewed. The patient's tolerance of                            previous anesthesia was also reviewed. The risks                            and benefits of the procedure and the sedation                            options and risks were discussed with the patient.                            All questions were answered, and informed consent                            was obtained. Prior Anticoagulants: The patient has                            taken no anticoagulant or antiplatelet agents. ASA                            Grade Assessment: III - A patient with severe                            systemic disease. After reviewing the risks and  benefits, the patient was deemed in satisfactory                            condition to undergo the procedure.                           After obtaining informed consent, the colonoscope                            was passed under direct vision. Throughout the                            procedure, the patient's blood pressure, pulse, and                            oxygen saturations were  monitored continuously. The                            [id number]) scope was introduced through the                            anus and advanced to the the cecum, identified by                            appendiceal orifice and ileocecal valve. The                            colonoscopy was performed without difficulty. The                            patient tolerated the procedure well. The quality                            of the bowel preparation was adequate. The                            ileocecal valve, appendiceal orifice, and rectum                            were photographed. Scope In: 8:17:45 AM Scope Out: 8:41:07 AM Scope Withdrawal Time: 0 hours 16 minutes 30 seconds  Total Procedure Duration: 0 hours 23 minutes 22 seconds  Findings:      The perianal and digital rectal examinations were normal.      Three semi-pedunculated polyps were found in the sigmoid colon, mid       sigmoid colon and splenic flexure. The polyps were 3 to 5 mm in size.       These polyps were removed with a cold snare. Resection and retrieval       were complete. Estimated blood loss was minimal. Redundant colon.       Hyperplastic ambulations in the rectosigmoid not manipulated      The exam was otherwise without abnormality on direct and retroflexion       views. Impression:               - Three 3  to 5 mm polyps in the sigmoid colon, in                            the mid sigmoid colon and at the splenic flexure,                            removed with a cold snare. Resected and retrieved.                            Redundant colon requiring external abdominal                            pressure. Scattered hyperplastic ambulations in the                            rectosigmoid/proximal rectum not manipulated.                           - The examination was otherwise normal on direct                            and retroflexion views. Moderate Sedation:      Moderate (conscious) sedation was  personally administered by an       anesthesia professional. The following parameters were monitored: oxygen       saturation, heart rate, blood pressure, respiratory rate, EKG, adequacy       of pulmonary ventilation, and response to care. Recommendation:           - Patient has a contact number available for                            emergencies. The signs and symptoms of potential                            delayed complications were discussed with the                            patient. Return to normal activities tomorrow.                            Written discharge instructions were provided to the                            patient.                           - Advance diet as tolerated. Follow-up on                            pathology. Further recommendations to follow.                            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 All rights reserved. The codes documented in this report are preliminary and upon coder review may  be revised to meet current compliance requirements. Gerrit Friends. Cheyla Duchemin, MD Gennette Pac, MD 02/13/2024 9:13:15 AM This report has been signed electronically. Number of Addenda: 0

## 2024-02-13 NOTE — H&P (Signed)
 @LOGO @   Primary Care Physician:  Billie Lade, MD Primary Gastroenterologist:  Dr. Jena Gauss  Pre-Procedure History & Physical: HPI:  Maria Fry is a 73 y.o. female here for diagnostic colonoscopy.  She is iron deficient.  History of multiple colonic adenomas removed 2022.  Eliquis held x 2 days.  Past Medical History:  Diagnosis Date   Arthritis    Right knee   Atrial fibrillation (HCC)    Back pain    CAD (coronary artery disease)    a. 06/07/15 NSTEMI/Cath: Dist LAD 100%, mid LAD 10%. Otw nl cors. Nl EF w/ inferoapical AK.   Dysrhythmia    Essential hypertension    Hip pain    History of cardiomyopathy 04/23/2016   a. 04/2016 Echo: EF 45%, Gr2 DD; b. 11/2019 Echo: EF 45-50%, mod LVH. Gr2 DD. Nl RV fxn. Sev dil LA.    Hyperlipidemia    Hypothyroidism    NSTEMI (non-ST elevated myocardial infarction) (HCC) 06/05/2015   Vitamin D deficiency     Past Surgical History:  Procedure Laterality Date   ABDOMINAL HYSTERECTOMY     partial- one ovary remaining   BIOPSY  09/17/2020   Procedure: BIOPSY;  Surgeon: Lanelle Bal, DO;  Location: [facility];  Service: Endoscopy;;   CARDIAC CATHETERIZATION N/A 06/06/2015   Procedure: Left Heart Cath and Coronary Angiography;  Surgeon: Peter M Swaziland, MD;  Location: [facility] INVASIVE CV LAB;  Service: Cardiovascular;  Laterality: N/A;   CARDIOVERSION N/A 09/14/2020   Procedure: CARDIOVERSION;  Surgeon: Jonelle Sidle, MD;  Location: [facility];  Service: Cardiovascular;  Laterality: N/A;   CARDIOVERSION N/A 06/20/2022   Procedure: CARDIOVERSION;  Surgeon: Antoine Poche, MD;  Location: [facility];  Service: Endoscopy;  Laterality: N/A;   CARDIOVERSION N/A 01/01/2023   Procedure: CARDIOVERSION;  Surgeon: Jodelle Red, MD;  Location: [facility] ENDOSCOPY;  Service: Cardiovascular;  Laterality: N/A;   CHOLECYSTECTOMY     COLONOSCOPY     COLONOSCOPY N/A 08/13/2017   Procedure: COLONOSCOPY;  Surgeon: Corbin Ade, MD;  Location: [facility];  Service: Endoscopy;  Laterality: N/A;  8:30 AM   COLONOSCOPY WITH PROPOFOL N/A 01/18/2021   Maria Fry: Multiple tubular adenomas removed, next colonoscopy in 3 years   ESOPHAGOGASTRODUODENOSCOPY N/A 09/11/2016   Procedure: ESOPHAGOGASTRODUODENOSCOPY (EGD);  Surgeon: Corbin Ade, MD;  Location: [facility];  Service: Endoscopy;  Laterality: N/A;  7:45 am - moved to 10/11 @ 10:30   ESOPHAGOGASTRODUODENOSCOPY (EGD) WITH PROPOFOL N/A 09/17/2020   [name]: Diffuse moderate inflammation characterized by erosions and erythema found in the entire stomach.  Biopsies positive for H. pylori.  Patient has not been treated due to drug allergies.   Heel spurs Left    MALONEY DILATION N/A 09/11/2016   Procedure: [name] DILATION;  Surgeon: Corbin Ade, MD;  Location: [facility];  Service: Endoscopy;  Laterality: N/A;   POLYPECTOMY  08/13/2017   Procedure: POLYPECTOMY;  Surgeon: Corbin Ade, MD;  Location: [facility];  Service: Endoscopy;;  colon   POLYPECTOMY  01/18/2021   Procedure: POLYPECTOMY;  Surgeon: Corbin Ade, MD;  Location: [facility];  Service: Endoscopy;;   SHOULDER ARTHROSCOPY Right    TUBAL LIGATION      Prior to Admission medications   Medication Sig Start Date End Date Taking? Authorizing Provider  acetaminophen (TYLENOL) 500 MG tablet Take 500 mg by mouth 2 (two) times daily.   Yes [provider]  amLODipine (NORVASC) 2.5 MG  tablet Take 1 tablet by mouth once daily 01/27/24  Yes Jonelle Sidle, MD  apixaban [facility]) 5 MG TABS tablet Take 1 tablet by mouth twice daily 07/14/23  Yes Fenton, Clint R, PA  atorvastatin (LIPITOR) 80 MG tablet TAKE 1 TABLET BY MOUTH AT BEDTIME 01/27/24  Yes Billie Lade, MD  calcium carbonate (OS-CAL - DOSED IN MG OF ELEMENTAL CALCIUM) 1250 (500 Ca) MG tablet Take 500 mg by mouth daily.   Yes [provider]  Cholecalciferol (VITAMIN D3) 50 MCG (2000 UT) capsule Take 1 capsule (2,000 Units total) by mouth daily.  03/07/22  Yes Paseda, Baird Kay, FNP  clopidogrel (PLAVIX) 75 MG tablet Take 75 mg by mouth daily.   Yes [provider]  diclofenac Sodium (VOLTAREN) 1 % GEL Apply 1 application topically 4 (four) times daily as needed (pain).   Yes [provider]  ergocalciferol (VITAMIN D2) 1.25 MG (50000 UT) capsule Take 50,000 Units by mouth once a week.   Yes [provider]  ferrous sulfate 325 (65 FE) MG EC tablet Take 1 tablet (325 mg total) by mouth daily. Patient taking differently: Take 325 mg by mouth 4 (four) times a week. 07/02/22  Yes Gilmore Laroche, FNP  furosemide (LASIX) 40 MG tablet Take 1 tablet (40 mg total) by mouth daily. 06/21/22  Yes Vassie Loll, MD  gabapentin (NEURONTIN) 300 MG capsule Take 1 capsule by mouth twice daily 01/27/24  Yes Billie Lade, MD  levothyroxine (SYNTHROID) 100 MCG tablet Take 1 tablet (100 mcg total) by mouth daily before breakfast. 01/23/23  Yes Nida, Denman George, MD  MAGnesium-Oxide 400 (240 Mg) MG tablet TAKE 1/2 (ONE-HALF) TABLET BY MOUTH DAILY 07/10/23  Yes Fenton, Clint R, PA  melatonin 3 MG TABS tablet Take 3 mg by mouth at bedtime as needed (Sleep).   Yes [provider]  potassium chloride SA (KLOR-CON M) 20 MEQ tablet Take 2 tablets (40 mEq total) by mouth daily. 06/21/22  Yes Vassie Loll, MD  spironolactone (ALDACTONE) 25 MG tablet Take 1/2 (one-half) tablet by mouth once daily 06/18/23  Yes Jonelle Sidle, MD  vitamin C (ASCORBIC ACID) 500 MG tablet Take 500 mg by mouth daily.   Yes [provider]  nitroGLYCERIN (NITROSTAT) 0.4 MG SL tablet DISSOLVE ONE TABLET UNDER THE TONGUE EVERY 5 MINUTES AS NEEDED FOR CHEST PAIN.  DO NOT EXCEED A TOTAL OF 3 DOSES IN 15 MINUTES 04/07/23   Jonelle Sidle, MD    Allergies as of 12/30/2023 - Review Complete 11/28/2023  Allergen Reaction Noted   Afrin [oxymetazoline] Anaphylaxis 01/15/2021   Amoxicillin Anaphylaxis 06/01/2018   Flonase [fluticasone]  Anaphylaxis 01/04/2022   Lisinopril Anaphylaxis 06/13/2015   Ranolazine Anaphylaxis 09/04/2020   Vicodin [hydrocodone-acetaminophen] Diarrhea and Nausea And Vomiting 04/25/2016   Demerol [meperidine] Nausea And Vomiting 01/17/2013   Lodine [etodolac] Rash 01/17/2013    Family History  Problem Relation Age of Onset   Heart failure Father        Deceased   Heart attack Father        Deceased   Stroke Sister        Deceased   Heart failure Brother        Deceased   Cancer Brother        Deceased   Heart attack Brother        Deceased   COPD Daughter    Arthritis Daughter    Lupus Daughter    Colon cancer Neg Hx  Social History   Socioeconomic History   Marital status: Single    Spouse name: Not on file   Number of children: 3   Years of education: 10   Highest education level: Not on file  Occupational History   Occupation: team leader at [employer]  Tobacco Use   Smoking status: Former    Current packs/day: 0.00    Average packs/day: 1 pack/day for 25.0 years (25.0 ttl pk-yrs)    Types: Cigarettes    Start date: 12/02/1977    Quit date: 12/02/2002    Years since quitting: 21.2   Smokeless tobacco: Never   Tobacco comments:    Former smoker 05/10/22  Vaping Use   Vaping status: Never Used  Substance and Sexual Activity   Alcohol use: No    Alcohol/week: 0.0 standard drinks of alcohol   Drug use: No   Sexual activity: Not Currently  Other Topics Concern   Not on file  Social History Narrative   Retired from [employer].   Social Drivers of Corporate investment banker Strain: High Risk (12/09/2022)   Overall Financial Resource Strain (CARDIA)    Difficulty of Paying Living Expenses: Hard  Food Insecurity: No Food Insecurity (12/09/2022)   Hunger Vital Sign    Worried About Running Out of Food in the Last Year: Never true    Ran Out of Food in the Last Year: Never true  Transportation Needs: No Transportation Needs (12/09/2022)   PRAPARE -  Administrator, Civil Service (Medical): No    Lack of Transportation (Non-Medical): No  Physical Activity: Inactive (12/09/2022)   Exercise Vital Sign    Days of Exercise per Week: 0 days    Minutes of Exercise per Session: 0 min  Stress: No Stress Concern Present (12/09/2022)   [employer] of Occupational Health - Occupational Stress Questionnaire    Feeling of Stress : Not at all  Social Connections: Moderately Integrated (12/09/2022)   Social Connection and Isolation Panel [NHANES]    Frequency of Communication with Friends and Family: More than three times a week    Frequency of Social Gatherings with Friends and Family: Once a week    Attends Religious Services: 1 to 4 times per year    Active Member of [employer] or Organizations: No    Attends Banker Meetings: Never    Marital Status: Living with partner  Intimate Partner Violence: Not At Risk (12/09/2022)   Humiliation, Afraid, Rape, and Kick questionnaire    Fear of Current or Ex-Partner: No    Emotionally Abused: No    Physically Abused: No    Sexually Abused: No    Review of Systems: See HPI, otherwise negative ROS  Physical Exam: BP 135/74   Pulse 65   Temp (!) 97.5 F (36.4 C) (Oral)   Resp 14   Ht 5\' 3"  (1.6 m)   Wt 98 kg   SpO2 99%   BMI 38.26 kg/m  General:   Alert,  Well-developed, well-nourished, pleasant and cooperative in NAD Mouth:  No deformity or lesions. Neck:  Supple; no masses or thyromegaly. No significant cervical adenopathy. Lungs:  Clear throughout to auscultation.   No wheezes, crackles, or rhonchi. No acute distress. Heart:  Regular rate and rhythm; no murmurs, clicks, rubs,  or gallops. Abdomen: Non-distended, normal bowel sounds.  Soft and nontender without appreciable mass or hepatosplenomegaly.   Impression/Plan: 73 year old lady with a history of multiple colonic adenomas removed previously now with iron  deficiency anemia here for a diagnostic colonoscopy.  Eliquis  held x 2 days. The risks, benefits, limitations, alternatives and imponderables have been reviewed with the patient. Questions have been answered. All parties are agreeable.       Notice: This dictation was prepared with Dragon dictation along with smaller phrase technology. Any transcriptional errors that result from this process are unintentional and may not be corrected upon review.

## 2024-02-13 NOTE — Discharge Instructions (Signed)
  Colonoscopy Discharge Instructions  Read the instructions outlined below and refer to this sheet in the next few weeks. These discharge instructions provide you with general information on caring for yourself after you leave the hospital. Your doctor may also give you specific instructions. While your treatment has been planned according to the most current medical practices available, unavoidable complications occasionally occur. If you have any problems or questions after discharge, call Dr. Jena Gauss at [phone]. ACTIVITY You may resume your regular activity, but move at a slower pace for the next 24 hours.  Take frequent rest periods for the next 24 hours.  Walking will help get rid of the air and reduce the bloated feeling in your belly (abdomen).  No driving for 24 hours (because of the medicine (anesthesia) used during the test).   Do not sign any important legal documents or operate any machinery for 24 hours (because of the anesthesia used during the test).  NUTRITION Drink plenty of fluids.  You may resume your normal diet as instructed by your doctor.  Begin with a light meal and progress to your normal diet. Heavy or fried foods are harder to digest and may make you feel sick to your stomach (nauseated).  Avoid alcoholic beverages for 24 hours or as instructed.  MEDICATIONS You may resume your normal medications unless your doctor tells you otherwise.  WHAT YOU CAN EXPECT TODAY Some feelings of bloating in the abdomen.  Passage of more gas than usual.  Spotting of blood in your stool or on the toilet paper.  IF YOU HAD POLYPS REMOVED DURING THE COLONOSCOPY: No aspirin products for 7 days or as instructed.  No alcohol for 7 days or as instructed.  Eat a soft diet for the next 24 hours.  FINDING OUT THE RESULTS OF YOUR TEST Not all test results are available during your visit. If your test results are not back during the visit, make an appointment with your caregiver to find out the  results. Do not assume everything is normal if you have not heard from your caregiver or the medical facility. It is important for you to follow up on all of your test results.  SEEK IMMEDIATE MEDICAL ATTENTION IF: You have more than a spotting of blood in your stool.  Your belly is swollen (abdominal distention).  You are nauseated or vomiting.  You have a temperature over 101.  You have abdominal pain or discomfort that is severe or gets worse throughout the day.    Small polyps removed today  Further recommendations to follow pending review of pathology report  Resume Eliquis tomorrow  At patient request I called Darrell Jewel at [phone] findings and recommendations

## 2024-02-13 NOTE — Anesthesia Preprocedure Evaluation (Signed)
 Anesthesia Evaluation  Patient identified by MRN, date of birth, ID band Patient awake    Reviewed: Allergy & Precautions, H&P , NPO status , Patient's Chart, lab work & pertinent test results, reviewed documented beta blocker date and time   Airway Mallampati: II  TM Distance: >3 FB Neck ROM: full    Dental no notable dental hx.    Pulmonary neg pulmonary ROS, sleep apnea , former smoker   Pulmonary exam normal breath sounds clear to auscultation       Cardiovascular Exercise Tolerance: Good hypertension, + angina  + CAD, + Past MI and +CHF  negative cardio ROS + dysrhythmias  Rhythm:regular Rate:Normal     Neuro/Psych  Neuromuscular disease negative neurological ROS  negative psych ROS   GI/Hepatic negative GI ROS, Neg liver ROS,GERD  ,,  Endo/Other  negative endocrine ROSHypothyroidism    Renal/GU Renal diseasenegative Renal ROS  negative genitourinary   Musculoskeletal   Abdominal   Peds  Hematology negative hematology ROS (+) Blood dyscrasia, anemia   Anesthesia Other Findings   Reproductive/Obstetrics negative OB ROS                             Anesthesia Physical Anesthesia Plan  ASA: 3  Anesthesia Plan: General   Post-op Pain Management:    Induction:   PONV Risk Score and Plan: Propofol infusion  Airway Management Planned:   Additional Equipment:   Intra-op Plan:   Post-operative Plan:   Informed Consent: I have reviewed the patients History and Physical, chart, labs and discussed the procedure including the risks, benefits and alternatives for the proposed anesthesia with the patient or authorized representative who has indicated his/her understanding and acceptance.     Dental Advisory Given  Plan Discussed with: CRNA  Anesthesia Plan Comments:        Anesthesia Quick Evaluation

## 2024-02-13 NOTE — Anesthesia Postprocedure Evaluation (Signed)
 Anesthesia Post Note  Patient: Maria Fry  Procedure(s) Performed: COLONOSCOPY WITH PROPOFOL POLYPECTOMY  Patient location during evaluation: Phase II Anesthesia Type: General Level of consciousness: awake Pain management: pain level controlled Vital Signs Assessment: post-procedure vital signs reviewed and stable Respiratory status: spontaneous breathing and respiratory function stable Cardiovascular status: blood pressure returned to baseline and stable Postop Assessment: no headache and no apparent nausea or vomiting Anesthetic complications: no Comments: Late entry   No notable events documented.   Last Vitals:  Vitals:   02/13/24 0846 02/13/24 0853  BP: (!) 97/57 113/60  Pulse: 60   Resp: 19   Temp: 36.5 C   SpO2: 97%     Last Pain:  Vitals:   02/13/24 0846  TempSrc: Axillary  PainSc: 0-No pain                 Windell Norfolk

## 2024-02-16 ENCOUNTER — Encounter (HOSPITAL_COMMUNITY): Payer: Self-pay | Admitting: Internal Medicine

## 2024-02-16 ENCOUNTER — Encounter: Payer: Self-pay | Admitting: Internal Medicine

## 2024-02-16 LAB — SURGICAL PATHOLOGY

## 2024-02-20 ENCOUNTER — Ambulatory Visit: Payer: Medicare Other | Admitting: Internal Medicine

## 2024-02-24 ENCOUNTER — Other Ambulatory Visit: Payer: Self-pay

## 2024-02-24 DIAGNOSIS — D509 Iron deficiency anemia, unspecified: Secondary | ICD-10-CM

## 2024-03-06 ENCOUNTER — Other Ambulatory Visit: Payer: Self-pay | Admitting: Internal Medicine

## 2024-03-22 ENCOUNTER — Other Ambulatory Visit: Payer: Self-pay | Admitting: "Endocrinology

## 2024-04-09 ENCOUNTER — Other Ambulatory Visit: Payer: Self-pay

## 2024-04-09 ENCOUNTER — Ambulatory Visit (INDEPENDENT_AMBULATORY_CARE_PROVIDER_SITE_OTHER): Admitting: Internal Medicine

## 2024-04-09 ENCOUNTER — Encounter: Payer: Self-pay | Admitting: Internal Medicine

## 2024-04-09 VITALS — BP 120/75 | HR 68 | Ht 63.0 in | Wt 219.2 lb

## 2024-04-09 DIAGNOSIS — I10 Essential (primary) hypertension: Secondary | ICD-10-CM | POA: Diagnosis not present

## 2024-04-09 DIAGNOSIS — G4733 Obstructive sleep apnea (adult) (pediatric): Secondary | ICD-10-CM

## 2024-04-09 DIAGNOSIS — E66812 Obesity, class 2: Secondary | ICD-10-CM | POA: Diagnosis not present

## 2024-04-09 DIAGNOSIS — D509 Iron deficiency anemia, unspecified: Secondary | ICD-10-CM

## 2024-04-09 DIAGNOSIS — I4819 Other persistent atrial fibrillation: Secondary | ICD-10-CM

## 2024-04-09 DIAGNOSIS — D508 Other iron deficiency anemias: Secondary | ICD-10-CM

## 2024-04-09 DIAGNOSIS — N1831 Chronic kidney disease, stage 3a: Secondary | ICD-10-CM

## 2024-04-09 NOTE — Progress Notes (Signed)
 Established Patient Office Visit  Subjective   Patient ID: Maria Fry, female    DOB: Dec 16, 1950  Age: 73 y.o. MRN: [id number]  Chief Complaint  Patient presents with   Care Management    Follow up   Maria Fry returns today for routine follow-up.  She was last evaluated by me in December 2024.  Jardiance  was added at that time in the setting of CKD.  No additional medication changes were made and 9-month follow-up was arranged.  In the interim she has been seen by gastroenterology, endocrinology, and cardiology.  She underwent colonoscopy on 3/14 with Dr. Riley Cheadle in the setting of iron deficiency anemia. Three 3-5 mm polyps were removed from the sigmoid colon, mid sigmoid colon, and splenic flexure.  Pathology showed tubular adenoma negative for high-grade dysplasia and carcinoma and a sessile serrated adenoma without cytologic dysplasia.  There have otherwise been no acute interval events.  Today she endorses fatigue and chronic back pain.  She does not have any acute concerns to discuss.  Past Medical History:  Diagnosis Date   Arthritis    Right knee   Atrial fibrillation (HCC)    Back pain    CAD (coronary artery disease)    a. 06/07/15 NSTEMI/Cath: Dist LAD 100%, mid LAD 10%. Otw nl cors. Nl EF w/ inferoapical AK.   Dysrhythmia    Essential hypertension    Hip pain    History of cardiomyopathy 04/23/2016   a. 04/2016 Echo: EF 45%, Gr2 DD; b. 11/2019 Echo: EF 45-50%, mod LVH. Gr2 DD. Nl RV fxn. Sev dil LA.    Hyperlipidemia    Hypothyroidism    NSTEMI (non-ST elevated myocardial infarction) (HCC) 06/05/2015   Vitamin D  deficiency    Past Surgical History:  Procedure Laterality Date   ABDOMINAL HYSTERECTOMY     partial- one ovary remaining   BIOPSY  09/17/2020   Procedure: BIOPSY;  Surgeon: Vinetta Greening, DO;  [facility];  Service: Endoscopy;;   CARDIAC CATHETERIZATION N/A 06/06/2015   Procedure: Left Heart Cath and Coronary Angiography;  Surgeon: Peter M Swaziland,  MD;  Location: [facility] INVASIVE CV LAB;  Service: Cardiovascular;  Laterality: N/A;   CARDIOVERSION N/A 09/14/2020   Procedure: CARDIOVERSION;  Surgeon: Gerard Knight, MD;  Location: [facility];  Service: Cardiovascular;  Laterality: N/A;   CARDIOVERSION N/A 06/20/2022   Procedure: CARDIOVERSION;  Surgeon: Laurann Pollock, MD;  Location: [facility];  Service: Endoscopy;  Laterality: N/A;   CARDIOVERSION N/A 01/01/2023   Procedure: CARDIOVERSION;  Surgeon: Sheryle Donning, MD;  Location: [facility] ENDOSCOPY;  Service: Cardiovascular;  Laterality: N/A;   CHOLECYSTECTOMY     COLONOSCOPY     COLONOSCOPY N/A 08/13/2017   Procedure: COLONOSCOPY;  Surgeon: Suzette Espy, MD;  [facility]: [facility];  Service: Endoscopy;  Laterality: N/A;  8:30 AM   COLONOSCOPY WITH PROPOFOL  N/A 01/18/2021   [name]: Multiple tubular adenomas removed, next colonoscopy in 3 years   COLONOSCOPY WITH PROPOFOL  N/A 02/13/2024   Procedure: COLONOSCOPY WITH PROPOFOL ;  Surgeon: Suzette Espy, MD;  [facility];  Service: Endoscopy;  Laterality: N/A;  8:00 am, asa 3   ESOPHAGOGASTRODUODENOSCOPY N/A 09/11/2016   Procedure: ESOPHAGOGASTRODUODENOSCOPY (EGD);  Surgeon: Suzette Espy, MD;  [facility];  Service: Endoscopy;  Laterality: N/A;  7:45 am - moved to 10/11 @ 10:30   ESOPHAGOGASTRODUODENOSCOPY (EGD) WITH PROPOFOL  N/A 09/17/2020   [name]: Diffuse moderate inflammation characterized by erosions and erythema found in  the entire stomach.  Biopsies positive for H. pylori.  Patient has not been treated due to drug allergies.   Heel spurs Left    MALONEY DILATION N/A 09/11/2016   Procedure: Londa Rival DILATION;  Surgeon: Suzette Espy, MD;  [facility];  Service: Endoscopy;  Laterality: N/A;   POLYPECTOMY  08/13/2017   Procedure: POLYPECTOMY;  Surgeon: Suzette Espy, MD;  [facility];  Service: Endoscopy;;  colon   POLYPECTOMY  01/18/2021   Procedure: POLYPECTOMY;  Surgeon: Suzette Espy, MD;  [facility];  Service: Endoscopy;;   POLYPECTOMY  02/13/2024   Procedure: POLYPECTOMY;  Surgeon: Suzette Espy, MD;  [facility];  Service: Endoscopy;;   SHOULDER ARTHROSCOPY Right    TUBAL LIGATION     Social History   Tobacco Use   Smoking status: Former    Current packs/day: 0.00    Average packs/day: 1 pack/day for 25.0 years (25.0 ttl pk-yrs)    Types: Cigarettes    Start date: 12/02/1977    Quit date: 12/02/2002    Years since quitting: 21.4   Smokeless tobacco: Never   Tobacco comments:    Former smoker 05/10/22  Vaping Use   Vaping status: Never Used  Substance Use Topics   Alcohol use: No    Alcohol/week: 0.0 standard drinks of alcohol   Drug use: No   Family History  Problem Relation Age of Onset   Heart failure Father        Deceased   Heart attack Father        Deceased   Stroke Sister        Deceased   Heart failure Brother        Deceased   Cancer Brother        Deceased   Heart attack Brother        Deceased   COPD Daughter    Arthritis Daughter    Lupus Daughter    Colon cancer Neg Hx    Allergies  Allergen Reactions   Afrin [Oxymetazoline] Anaphylaxis   Amoxicillin  Anaphylaxis   Flonase  [Fluticasone ] Anaphylaxis   Lisinopril  Anaphylaxis   Ranolazine  Anaphylaxis    Throat swells, Dizzy, Reaction occurred with generic med   Vicodin [Hydrocodone -Acetaminophen ] Diarrhea and Nausea And Vomiting   Demerol  [Meperidine ] Nausea And Vomiting   Lodine [Etodolac] Rash   Review of Systems  Constitutional:  Positive for malaise/fatigue.  Musculoskeletal:  Positive for back pain (chronic lumbar pain).     Objective:     BP 120/75   Pulse 68   Ht 5\' 3"  (1.6 m)   Wt 219 lb 3.2 oz (99.4 kg)   SpO2 95%   BMI 38.83 kg/m  BP Readings from Last 3 Encounters:  04/09/24 120/75  02/13/24 113/60  02/11/24 120/76   Physical Exam Vitals reviewed.  Constitutional:      General: She is not in acute distress.     Appearance: Normal appearance. She is obese. She is not toxic-appearing.  HENT:     Head: Normocephalic and atraumatic.     Right Ear: External ear normal.     Left Ear: External ear normal.     Nose: Nose normal. No congestion or rhinorrhea.     Mouth/Throat:     Mouth: Mucous membranes are moist.     Pharynx: Oropharynx is clear. No oropharyngeal exudate or posterior oropharyngeal erythema.  Eyes:     General: No scleral icterus.  Extraocular Movements: Extraocular movements intact.     Conjunctiva/sclera: Conjunctivae normal.     Pupils: Pupils are equal, round, and reactive to light.  Cardiovascular:     Rate and Rhythm: Normal rate. Rhythm irregular.     Pulses: Normal pulses.     Heart sounds: Normal heart sounds. No murmur heard.    No friction rub. No gallop.  Pulmonary:     Effort: Pulmonary effort is normal.     Breath sounds: Normal breath sounds. No wheezing, rhonchi or rales.  Abdominal:     General: Abdomen is flat. Bowel sounds are normal. There is no distension.     Palpations: Abdomen is soft.     Tenderness: There is no abdominal tenderness.  Musculoskeletal:        General: No swelling. Normal range of motion.     Cervical back: Normal range of motion.     Right lower leg: No edema.     Left lower leg: No edema.  Lymphadenopathy:     Cervical: No cervical adenopathy.  Skin:    General: Skin is warm and dry.     Capillary Refill: Capillary refill takes less than 2 seconds.     Coloration: Skin is not jaundiced.  Neurological:     General: No focal deficit present.     Mental Status: She is alert and oriented to person, place, and time.  Psychiatric:        Mood and Affect: Mood normal.        Behavior: Behavior normal.   Last CBC Lab Results  Component Value Date   WBC 6.9 04/27/2024   HGB 13.9 04/27/2024   HCT 43.8 04/27/2024   MCV 94 04/27/2024   MCH 29.8 04/27/2024   RDW 16.9 (H) 04/27/2024   PLT 260 04/27/2024   Last metabolic panel Lab  Results  Component Value Date   GLUCOSE 107 (H) 02/11/2024   NA 137 02/11/2024   K 3.8 02/11/2024   CL 103 02/11/2024   CO2 25 02/11/2024   BUN 14 02/11/2024   CREATININE 1.03 (H) 02/11/2024   GFRNONAA 58 (L) 02/11/2024   CALCIUM  8.7 (L) 02/11/2024   PHOS 3.1 06/19/2022   PROT 7.3 05/16/2023   ALBUMIN 3.7 (L) 05/16/2023   LABGLOB 3.6 05/16/2023   AGRATIO 1.0 05/16/2023   BILITOT <0.2 05/16/2023   ALKPHOS 99 05/16/2023   AST 24 05/16/2023   ALT 22 05/16/2023   ANIONGAP 9 02/11/2024   Last lipids Lab Results  Component Value Date   CHOL 111 01/19/2024   HDL 40 01/19/2024   LDLCALC 58 01/19/2024   TRIG 57 01/19/2024   CHOLHDL 2.8 01/19/2024   Last hemoglobin A1c Lab Results  Component Value Date   HGBA1C 5.6 05/16/2023   Last thyroid  functions Lab Results  Component Value Date   TSH 3.170 01/19/2024   Last vitamin D  Lab Results  Component Value Date   VD25OH 28.8 (L) 08/25/2023   Last vitamin B12 and Folate Lab Results  Component Value Date   VITAMINB12 403 08/23/2020     Assessment & Plan:   Problem List Items Addressed This Visit       Persistent atrial fibrillation (HCC)   Irregularly irregular rate and rhythm on exam again today.  She remains on Eliquis .  She has previously discussed ablation with EP but must first lose weight in order to become a candidate for the procedure.  Addressing obesity as otherwise documented,      Chronic kidney  disease, stage 3a (HCC)   Renal function stable on labs from March.  Jardiance  10 mg daily was started at her previous appointment.      Iron deficiency anemia - Primary   Hgb stable on labs from March.  She remains on oral iron supplementation.  Recently evaluated by gastroenterology and underwent colonoscopy.  3 polyps removed.  Repeat CBC and iron studies ordered today.      Obesity, Class II, BMI 35-39.9   Current weight 219 pounds.  BMI 38.8.  She has attempted to make significant lifestyle changes aimed  at weight loss but is not seeing results.  Wegovy  not covered by insurance.  She has been been seen by medical nutrition therapy previously as well.  Additional treatment options were reviewed.  Through shared decision making, she has been referred to healthy weight and wellness.      Return in about 3 months (around 07/10/2024).   Tobi Fortes, MD

## 2024-04-09 NOTE — Patient Instructions (Signed)
 It was a pleasure to see you today.  Thank you for giving us  the opportunity to be involved in your care.  Below is a brief recap of your visit and next steps.  We will plan to see you again in 3 months.  Summary No medication changes today Repeat labs ordered Health weight and wellness referral placed Follow up in 3 months

## 2024-04-10 LAB — CBC WITH DIFFERENTIAL/PLATELET
Basophils Absolute: 0 10*3/uL (ref 0.0–0.2)
Basos: 1 %
EOS (ABSOLUTE): 0.1 10*3/uL (ref 0.0–0.4)
Eos: 2 %
Hematocrit: 42.7 % (ref 34.0–46.6)
Hemoglobin: 13.3 g/dL (ref 11.1–15.9)
Immature Grans (Abs): 0 10*3/uL (ref 0.0–0.1)
Immature Granulocytes: 0 %
Lymphocytes Absolute: 2.2 10*3/uL (ref 0.7–3.1)
Lymphs: 31 %
MCH: 28.2 pg (ref 26.6–33.0)
MCHC: 31.1 g/dL — ABNORMAL LOW (ref 31.5–35.7)
MCV: 91 fL (ref 79–97)
Monocytes Absolute: 0.7 10*3/uL (ref 0.1–0.9)
Monocytes: 9 %
Neutrophils Absolute: 4 10*3/uL (ref 1.4–7.0)
Neutrophils: 57 %
Platelets: 266 10*3/uL (ref 150–450)
RBC: 4.71 x10E6/uL (ref 3.77–5.28)
RDW: 18.2 % — ABNORMAL HIGH (ref 11.7–15.4)
WBC: 7 10*3/uL (ref 3.4–10.8)

## 2024-04-10 LAB — IRON,TIBC AND FERRITIN PANEL
Ferritin: 27 ng/mL (ref 15–150)
Iron Saturation: 12 % — ABNORMAL LOW (ref 15–55)
Iron: 37 ug/dL (ref 27–139)
Total Iron Binding Capacity: 313 ug/dL (ref 250–450)
UIBC: 276 ug/dL (ref 118–369)

## 2024-04-13 ENCOUNTER — Ambulatory Visit: Payer: Self-pay

## 2024-04-15 ENCOUNTER — Other Ambulatory Visit: Payer: Self-pay | Admitting: Internal Medicine

## 2024-04-15 DIAGNOSIS — E785 Hyperlipidemia, unspecified: Secondary | ICD-10-CM

## 2024-04-28 LAB — CBC WITH DIFFERENTIAL/PLATELET
Basophils Absolute: 0 10*3/uL (ref 0.0–0.2)
Basos: 0 %
EOS (ABSOLUTE): 0.1 10*3/uL (ref 0.0–0.4)
Eos: 2 %
Hematocrit: 43.8 % (ref 34.0–46.6)
Hemoglobin: 13.9 g/dL (ref 11.1–15.9)
Immature Grans (Abs): 0 10*3/uL (ref 0.0–0.1)
Immature Granulocytes: 0 %
Lymphocytes Absolute: 1.8 10*3/uL (ref 0.7–3.1)
Lymphs: 26 %
MCH: 29.8 pg (ref 26.6–33.0)
MCHC: 31.7 g/dL (ref 31.5–35.7)
MCV: 94 fL (ref 79–97)
Monocytes Absolute: 0.5 10*3/uL (ref 0.1–0.9)
Monocytes: 7 %
Neutrophils Absolute: 4.5 10*3/uL (ref 1.4–7.0)
Neutrophils: 65 %
Platelets: 260 10*3/uL (ref 150–450)
RBC: 4.67 x10E6/uL (ref 3.77–5.28)
RDW: 16.9 % — ABNORMAL HIGH (ref 11.7–15.4)
WBC: 6.9 10*3/uL (ref 3.4–10.8)

## 2024-04-29 ENCOUNTER — Ambulatory Visit: Payer: Self-pay | Admitting: Internal Medicine

## 2024-05-05 ENCOUNTER — Encounter: Payer: Self-pay | Admitting: Internal Medicine

## 2024-05-05 NOTE — Assessment & Plan Note (Signed)
 Irregularly irregular rate and rhythm on exam again today.  She remains on Eliquis .  She has previously discussed ablation with EP but must first lose weight in order to become a candidate for the procedure.  Addressing obesity as otherwise documented,

## 2024-05-05 NOTE — Assessment & Plan Note (Signed)
 Current weight 219 pounds.  BMI 38.8.  She has attempted to make significant lifestyle changes aimed at weight loss but is not seeing results.  Wegovy  not covered by insurance.  She has been been seen by medical nutrition therapy previously as well.  Additional treatment options were reviewed.  Through shared decision making, she has been referred to healthy weight and wellness.

## 2024-05-05 NOTE — Assessment & Plan Note (Signed)
 Renal function stable on labs from March.  Jardiance  10 mg daily was started at her previous appointment.

## 2024-05-05 NOTE — Assessment & Plan Note (Signed)
 Hgb stable on labs from March.  She remains on oral iron supplementation.  Recently evaluated by gastroenterology and underwent colonoscopy.  3 polyps removed.  Repeat CBC and iron studies ordered today.

## 2024-05-10 ENCOUNTER — Other Ambulatory Visit (HOSPITAL_COMMUNITY): Payer: Self-pay | Admitting: Physician Assistant

## 2024-05-10 NOTE — Telephone Encounter (Signed)
 Prescription refill request for Eliquis  received. Indication: AF Last office visit: 02/02/24  A Mealor MD Scr: 1.03 on 02/11/24  Epic Age: 73 Weight: 99kg  Based on above findings Eliquis  5mg  twice daily is the appropriate dose.  Refill approved.

## 2024-05-17 ENCOUNTER — Other Ambulatory Visit: Payer: Self-pay

## 2024-05-17 DIAGNOSIS — D509 Iron deficiency anemia, unspecified: Secondary | ICD-10-CM

## 2024-05-18 ENCOUNTER — Encounter: Payer: Self-pay | Admitting: Cardiology

## 2024-05-18 ENCOUNTER — Ambulatory Visit: Payer: Medicare Other | Attending: Cardiology | Admitting: Cardiology

## 2024-05-18 VITALS — BP 118/76 | HR 77 | Ht 62.0 in | Wt 218.2 lb

## 2024-05-18 DIAGNOSIS — I5032 Chronic diastolic (congestive) heart failure: Secondary | ICD-10-CM | POA: Diagnosis not present

## 2024-05-18 DIAGNOSIS — I4819 Other persistent atrial fibrillation: Secondary | ICD-10-CM

## 2024-05-18 DIAGNOSIS — I25119 Atherosclerotic heart disease of native coronary artery with unspecified angina pectoris: Secondary | ICD-10-CM | POA: Diagnosis not present

## 2024-05-18 DIAGNOSIS — I2510 Atherosclerotic heart disease of native coronary artery without angina pectoris: Secondary | ICD-10-CM

## 2024-05-18 MED ORDER — DAPAGLIFLOZIN PROPANEDIOL 5 MG PO TABS: 5.0000 mg | ORAL_TABLET | Freq: Every day | ORAL | 11 refills | Status: DC

## 2024-05-18 NOTE — Patient Instructions (Signed)
 Medication Instructions:   START Fraxiga 5 mg daily. Let us  know how you tolerate this.  Labwork: None   Testing/Procedures: None   Follow-Up: 6 months  Any Other Special Instructions Will Be Listed Below (If Applicable).  If you need a refill on your cardiac medications before your next appointment, please call your pharmacy.

## 2024-05-18 NOTE — Progress Notes (Signed)
    Cardiology Office Note  Date: 05/18/2024   ID: Maria Fry, DOB [birth year], MRN [id number]  History of Present Illness: Maria Fry is a 73 y.o. female last seen in December 2024.  She is here for a follow-up visit.  Reports palpitations and shortness of breath in atrial fibrillation as before, not necessarily any progressive symptoms.  No exertional chest pain.  I reviewed her follow-up note with Dr. Arlester Ladd back in March.  We went over her medications.  She reports compliance with treatment.  We discussed her diet, she has tried to cut back on calories, although weight has not decreased substantially.  She tells me that she tried Wegovy  previously and tolerated it, but could not afford it.  Also went on Jardiance  per PCP but had nausea.  Physical Exam: VS:  BP 118/76   Pulse 77   Ht 5' 2 (1.575 m)   Wt 218 lb 3.2 oz (99 kg)   SpO2 96%   BMI 39.91 kg/m , BMI Body mass index is 39.91 kg/m.  Wt Readings from Last 3 Encounters:  05/18/24 218 lb 3.2 oz (99 kg)  04/09/24 219 lb 3.2 oz (99.4 kg)  02/13/24 216 lb (98 kg)    General: Patient appears comfortable at rest. HEENT: Conjunctiva and lids normal. Neck: Supple, no elevated JVP or carotid bruits. Lungs: Clear to auscultation, nonlabored breathing at rest. Cardiac: Irregularly irregular, no gallop. Extremities: No pitting edema.  ECG:  An ECG dated 02/02/2024 was personally reviewed today and demonstrated:  Rate controlled atrial fibrillation with nonspecific ST-T changes.  Labwork: 01/19/2024: TSH 3.170 02/11/2024: BUN 14; Creatinine, Ser 1.03; Potassium 3.8; Sodium 137 04/27/2024: Hemoglobin 13.9; Platelets 260     Component Value Date/Time   CHOL 111 01/19/2024 0924   TRIG 57 01/19/2024 0924   HDL 40 01/19/2024 0924   CHOLHDL 2.8 01/19/2024 0924   CHOLHDL 2.7 01/03/2020 0948   VLDL 12 06/23/2017 0902   LDLCALC 58 01/19/2024 0924   LDLCALC 63 01/03/2020 0948   Other Studies Reviewed Today:  No interval cardiac  testing for review today.  Assessment and Plan:  1.  CAD status post NSTEMI in 2016 with occlusion of the distal LAD managed medically.  No angina on medical therapy.  She remains on Plavix  75 mg daily, Lipitor  80 mg daily, and as needed nitroglycerin .   2.  Paroxysmal to persistent atrial fibrillation with CHA2DS2-VASc score of 5.  She has not had optimal rhythm control on either Tikosyn  or amiodarone , most recently focusing on heart rate control strategy and anticoagulation.  She is trying to lose weight, has seen Dr. Arlester Ladd about possibility of ablation.  For now continue Eliquis  5 mg twice daily.   3.  HFpEF, LVEF approximately 55% by echocardiogram in July 2023.  She is on Aldactone  12.5 mg daily, also Lasix  40 mg daily with potassium supplement.  We will try Farxiga starting at 5 mg daily to see if she tolerates this better than Jardiance .  May also help with weight loss.  Disposition:  Follow up 6 months.  Signed, Gerard Knight, M.D., F.A.C.C. [facility]

## 2024-05-20 ENCOUNTER — Other Ambulatory Visit: Payer: Self-pay | Admitting: Cardiology

## 2024-06-02 ENCOUNTER — Other Ambulatory Visit: Payer: Self-pay | Admitting: Internal Medicine

## 2024-07-13 ENCOUNTER — Ambulatory Visit

## 2024-07-13 VITALS — BP 122/81 | HR 73 | Ht 62.0 in | Wt 219.0 lb

## 2024-07-13 DIAGNOSIS — E782 Mixed hyperlipidemia: Secondary | ICD-10-CM

## 2024-07-13 DIAGNOSIS — E0849 Diabetes mellitus due to underlying condition with other diabetic neurological complication: Secondary | ICD-10-CM

## 2024-07-13 DIAGNOSIS — Z6839 Body mass index (BMI) 39.0-39.9, adult: Secondary | ICD-10-CM

## 2024-07-13 DIAGNOSIS — M5136 Other intervertebral disc degeneration, lumbar region with discogenic back pain only: Secondary | ICD-10-CM

## 2024-07-13 MED ORDER — UNABLE TO FIND: 1.0000 | Freq: Every day | 0 refills | Status: AC

## 2024-07-13 MED ORDER — ATORVASTATIN CALCIUM 80 MG PO TABS: 80.0000 mg | ORAL_TABLET | Freq: Every day | ORAL | 3 refills | Status: AC

## 2024-07-13 NOTE — Assessment & Plan Note (Addendum)
 She was recently started on Farxiga  5 mg by Dr. Debera, however she states that she has not started taking this yet because she is scared of the side effects.  She was told that she needs to lose weight, at least 40 pounds, before she can have ablation done for her A-fib.  I have encouraged her to start taking the Farxiga  in order to help her with weight loss goals.  She agrees to start taking it.

## 2024-07-13 NOTE — Assessment & Plan Note (Signed)
 BMI 40.06 today.  Needs to lose 40 pounds to improve candidacy for A-fib ablation.  We discussed the need for her to engage in more physical activity, with a goal of 150 minutes/week.  She states that she [facility] the [facility] center to [facility] but was unable to tolerate due to chronic back pain.  She is requesting of back brace/lumbar support to help her with her back pain.  I have agreed to order this for her today.  Encouraged her to return to the senior center and even participate in chair aerobics or other low impact exercises to help her achieve weight loss.

## 2024-07-13 NOTE — Assessment & Plan Note (Signed)
 She has known degenerative disc disease.  She has been seen by neurosurgeon for this, but does not want to proceed with surgery.  I have provided her with an order for a back brace/lumbar support to aid her with walking, cooking and exercising.  She states that this has helped her in the past with her back pain.  An order was sent to [facility].

## 2024-07-13 NOTE — Progress Notes (Signed)
 Established Patient Office Visit  Subjective   Patient ID: Maria Fry, female    DOB: Mar 06, 1951  Age: 73 y.o. MRN: [id number]  Chief Complaint  Patient presents with   Care Management    Three month follow up    HPI  Patient Active Problem List   Diagnosis Date Noted   Diabetes due to undrl condition w oth diabetic neuro comp (HCC) 07/13/2024   Chronic kidney disease, stage 3a (HCC) 11/20/2023   Decreased GFR 08/25/2023   Need for influenza vaccination 08/25/2023   Breast cancer screening by mammogram 05/16/2023   Meralgia paresthetica 11/20/2022   Lumbar back pain 09/25/2022   Encounter for general adult medical examination with abnormal findings 09/25/2022   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HCC) 07/01/2022   Elevated brain natriuretic peptide (BNP) level 06/19/2022   Insomnia 06/19/2022   Chest pain 06/18/2022   Hypomagnesemia 09/05/2021   Wheezing 03/06/2021   Sleep apnea 02/06/2021   Vitamin D  insufficiency 01/30/2021   Encounter to establish care 01/30/2021   Helicobacter pylori gastritis 11/01/2020   H/O adenomatous polyp of colon 10/31/2020   Constipation 10/15/2020   History of GI bleed 10/15/2020   Persistent atrial fibrillation (HCC) 10/10/2020   Hypercoagulable state due to persistent atrial fibrillation (HCC) 10/05/2020   Obesity, Class II, BMI 35-39.9    Gastroesophageal reflux disease    Gastritis    Acquired hypothyroidism    Melena    CHF exacerbation (HCC) 09/15/2020   Atrial fibrillation, chronic (HCC) 08/23/2020   Stable angina (HCC) 01/14/2020   Abnormal nuclear stress test 12/08/2019   Iron deficiency anemia 11/28/2019   Hyperlipidemia 11/27/2019   OA (osteoarthritis) of knee 05/18/2019   DDD (degenerative disc disease), lumbar 05/18/2019   Obesity 12/26/2017   Seasonal allergies 08/19/2016   Glucose intolerance (impaired glucose tolerance) 05/14/2016   Chronic hip pain 05/14/2016   Chronic combined systolic and diastolic heart failure (HCC)  93/86/7982   Hypothyroidism following radioiodine therapy 11/30/2015   CAD (coronary artery disease)    NSTEMI (non-ST elevated myocardial infarction) (HCC)    Essential hypertension 06/05/2015    ROS    Objective:     BP 122/81   Pulse 73   Ht 5' 2 (1.575 m)   Wt 219 lb (99.3 kg)   SpO2 95%   BMI 40.06 kg/m  BP Readings from Last 3 Encounters:  07/13/24 122/81  05/18/24 118/76  04/09/24 120/75   Wt Readings from Last 3 Encounters:  07/13/24 219 lb (99.3 kg)  05/18/24 218 lb 3.2 oz (99 kg)  04/09/24 219 lb 3.2 oz (99.4 kg)      Physical Exam Vitals and nursing note reviewed.  Constitutional:      Appearance: Normal appearance. She is obese.  HENT:     Head: Normocephalic.  Eyes:     Extraocular Movements: Extraocular movements intact.     Pupils: Pupils are equal, round, and reactive to light.  Cardiovascular:     Rate and Rhythm: Normal rate and regular rhythm.  Pulmonary:     Effort: Pulmonary effort is normal.     Breath sounds: Normal breath sounds.  Musculoskeletal:     Cervical back: Normal range of motion and neck supple.  Neurological:     Mental Status: She is alert and oriented to person, place, and time.  Psychiatric:        Mood and Affect: Mood normal.        Thought Content: Thought content normal.  No results found for any visits on 07/13/24.  Last CBC Lab Results  Component Value Date   WBC 6.9 04/27/2024   HGB 13.9 04/27/2024   HCT 43.8 04/27/2024   MCV 94 04/27/2024   MCH 29.8 04/27/2024   RDW 16.9 (H) 04/27/2024   PLT 260 04/27/2024   Last metabolic panel Lab Results  Component Value Date   GLUCOSE 107 (H) 02/11/2024   NA 137 02/11/2024   K 3.8 02/11/2024   CL 103 02/11/2024   CO2 25 02/11/2024   BUN 14 02/11/2024   CREATININE 1.03 (H) 02/11/2024   GFRNONAA 58 (L) 02/11/2024   CALCIUM  8.7 (L) 02/11/2024   PHOS 3.1 06/19/2022   PROT 7.3 05/16/2023   ALBUMIN 3.7 (L) 05/16/2023   LABGLOB 3.6 05/16/2023    AGRATIO 1.0 05/16/2023   BILITOT <0.2 05/16/2023   ALKPHOS 99 05/16/2023   AST 24 05/16/2023   ALT 22 05/16/2023   ANIONGAP 9 02/11/2024   Last lipids Lab Results  Component Value Date   CHOL 111 01/19/2024   HDL 40 01/19/2024   LDLCALC 58 01/19/2024   TRIG 57 01/19/2024   CHOLHDL 2.8 01/19/2024   Last hemoglobin A1c Lab Results  Component Value Date   HGBA1C 5.6 05/16/2023   Last thyroid  functions Lab Results  Component Value Date   TSH 3.170 01/19/2024   Last vitamin D  Lab Results  Component Value Date   VD25OH 28.8 (L) 08/25/2023      The ASCVD Risk score (Arnett DK, et al., 2019) failed to calculate for the following reasons:   Risk score cannot be calculated because patient has a medical history suggesting prior/existing ASCVD    Assessment & Plan:   Problem List Items Addressed This Visit       Endocrine   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 Debera, however she states that she has not started taking this yet because she is scared of the side effects.  She was told that she needs to lose weight, at least 40 pounds, before she can have ablation done for her A-fib.  I have encouraged her to start taking the Farxiga  in order to help her with weight loss goals.  She agrees to start taking it.      Relevant Medications   atorvastatin  (LIPITOR ) 80 MG tablet     Musculoskeletal and Integument   DDD (degenerative disc disease), lumbar   She has known degenerative disc disease.  She has been seen by neurosurgeon for this, but does not want to proceed with surgery.  I have provided her with an order for a back brace/lumbar support to aid her with walking, cooking and exercising.  She states that this has helped her in the past with her back pain.  An order was sent to [facility].        Other   Obesity   BMI 40.06 today.  Needs to lose 40 pounds to improve candidacy for A-fib  ablation.  We discussed the need for her to engage in more physical activity, with a goal of 150 minutes/week.  She states that she used to go to the senior center to exercise but was unable to tolerate due to chronic back pain.  She is requesting of back brace/lumbar support to help her with her back pain.  I have agreed to order this for her today.  Encouraged her to return  to the senior center and even participate in chair aerobics or other low impact exercises to help her achieve weight loss.      Hyperlipidemia - Primary   Currently prescribed atorvastatin  80 mg daily.  Lipid panel updated earlier this year and was well within goal. Atorvastatin  80 mg refilled today.  We will update lipid panel at next office visit.      Relevant Medications   atorvastatin  (LIPITOR ) 80 MG tablet    Return in about 3 months (around 10/13/2024) for chronic follow-up with PCP.    Leita Longs, FNP

## 2024-07-13 NOTE — Assessment & Plan Note (Signed)
 Currently prescribed atorvastatin  80 mg daily.  Lipid panel updated earlier this year and was well within goal. Atorvastatin  80 mg refilled today.  We will update lipid panel at next office visit.

## 2024-08-02 ENCOUNTER — Other Ambulatory Visit: Payer: Self-pay | Admitting: Physician Assistant

## 2024-08-02 ENCOUNTER — Other Ambulatory Visit: Payer: Self-pay

## 2024-08-12 ENCOUNTER — Other Ambulatory Visit: Payer: Self-pay

## 2024-08-12 DIAGNOSIS — D509 Iron deficiency anemia, unspecified: Secondary | ICD-10-CM

## 2024-09-08 ENCOUNTER — Other Ambulatory Visit: Payer: Self-pay

## 2024-09-08 NOTE — Progress Notes (Unsigned)
  Electrophysiology Office Note:    Date:  09/09/2024   ID:  NYLA CREASON, DOB 17-Mar-1951, MRN [id number]  PCP:  Bevely Doffing, FNP   [facility] Providers Cardiologist:  Jayson Sierras, MD Electrophysiologist:  Eulas FORBES Furbish, MD     Referring MD: Bevely Doffing, FNP   History of Present Illness:    Maria Fry is a 73 y.o. female with a hx listed below, significant for CAD, chronic combined systolic and diastolic CHF, OSA, hypothyroid, persistent atrial fibrillation referred for arrhythmia management.  She was initially diagnosed with atrial fibrillation in Sept 2021.  She has been managed with Tikosyn  but has had episodes of breakthrough.  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  She has fatigue and shortness of breath with exertion.  No significant chest pain, dependent edema.  She underwent DCCV in January, 2024. She maintained sinus rhythm for some time but has had recurrence of atypical flutter on ECG today. She reports that she feels well and was unaware that her flutter had recurred.  She continues to feel very short of breath and have poor exertional tolerance.  She can only go up 3-4 steps before she is completely out of breath.   EKGs/Labs/Other Studies Reviewed Today:     TTE: EF 55%, severe LVH  EKG:  Last EKG results: atypical atrial flutter, V-rate 54 BPM   Recent Labs: 01/19/2024: TSH 3.170 02/11/2024: BUN 14; Creatinine, Ser 1.03; Potassium 3.8; Sodium 137 04/27/2024: Hemoglobin 13.9; Platelets 260     Physical Exam:    VS:  BP 128/84 (BP Location: Right Arm)   Pulse 76   Ht 5' 3 (1.6 m)   Wt 220 lb 9.6 oz (100.1 kg)   SpO2 96%   BMI 39.08 kg/m     Wt Readings from Last 3 Encounters:  09/09/24 220 lb 9.6 oz (100.1 kg)  07/13/24 219 lb (99.3 kg)  05/18/24 218 lb 3.2 oz (99 kg)    GEN:  Well nourished, well developed in no acute distress CARDIAC: iRRR, no murmurs, rubs, gallops RESPIRATORY:  Normal work of  breathing MUSCULOSKELETAL: no edema    ASSESSMENT & PLAN:    Longstanding persistent atrial fibrillation/atypical atrial flutter:  Pt had recurrence of flutter on amiodarone   LA size 5.1 cm in 2023 In the past, I have judged her to be a poor candidate for ablation due to BMI and appearance of atypical flutter; however, with the improved risk-benefit ratio of pulsed field ablation I would consider proceeding with ablation --particularly if she is able to lose weight -- unfortunately she has not lost any weight. At this point, I think ablation would be low yield.  However, because she has quite fatigued and eager for an intervention, I explained that I would be willing to consider ablation if she were to lose at least 20 pounds. Will place a monitor today to assess her heart rates and chronotropic competence in atrial fibrillation.  Secondary hypercoagulable state: CHADs2Vasc 5.   CHFrEF EF recovered. 55%        Medication Adjustments/Labs and Tests Ordered: Current medicines are reviewed at length with the patient today.  Concerns regarding medicines are outlined above.  Orders Placed This Encounter  Procedures   EKG 12-Lead   No orders of the defined types were placed in this encounter.    Signed, Eulas FORBES Furbish, MD  09/09/2024 10:03 AM    [facility]

## 2024-09-09 ENCOUNTER — Ambulatory Visit: Attending: Cardiovascular Disease

## 2024-09-09 ENCOUNTER — Telehealth: Payer: Self-pay | Admitting: Cardiovascular Disease

## 2024-09-09 ENCOUNTER — Ambulatory Visit: Attending: Cardiovascular Disease | Admitting: Cardiovascular Disease

## 2024-09-09 ENCOUNTER — Other Ambulatory Visit: Payer: Self-pay | Admitting: Cardiovascular Disease

## 2024-09-09 ENCOUNTER — Encounter: Payer: Self-pay | Admitting: Cardiovascular Disease

## 2024-09-09 VITALS — BP 128/84 | HR 76 | Ht 63.0 in | Wt 220.6 lb

## 2024-09-09 DIAGNOSIS — I4819 Other persistent atrial fibrillation: Secondary | ICD-10-CM | POA: Diagnosis not present

## 2024-09-09 DIAGNOSIS — I4891 Unspecified atrial fibrillation: Secondary | ICD-10-CM

## 2024-09-09 NOTE — Patient Instructions (Addendum)
 Medication Instructions:   Continue all current medications.   Labwork:  none  Testing/Procedures:  Your physician has recommended that you wear a 3 day event monitor. Event monitors are medical devices that record the heart's electrical activity. Doctors most often us  these monitors to diagnose arrhythmias. Arrhythmias are problems with the speed or rhythm of the heartbeat. The monitor is a small, portable device. You can wear one while you do your normal daily activities. This is usually used to diagnose what is causing palpitations/syncope (passing out).  Office will contact with results via phone, letter or mychart.     Follow-Up:  2 months   Any Other Special Instructions Will Be Listed Below (If Applicable).   If you need a refill on your cardiac medications before your next appointment, please call your pharmacy.

## 2024-09-09 NOTE — Telephone Encounter (Signed)
 Checking percert on the following   LONG TERM MONITOR (3-14 DAYS)

## 2024-09-14 ENCOUNTER — Other Ambulatory Visit: Payer: Self-pay | Admitting: "Endocrinology

## 2024-09-21 DIAGNOSIS — I4891 Unspecified atrial fibrillation: Secondary | ICD-10-CM | POA: Diagnosis not present

## 2024-09-27 DIAGNOSIS — I4891 Unspecified atrial fibrillation: Secondary | ICD-10-CM | POA: Diagnosis not present

## 2024-10-21 ENCOUNTER — Ambulatory Visit (INDEPENDENT_AMBULATORY_CARE_PROVIDER_SITE_OTHER)

## 2024-10-21 VITALS — BP 129/71 | HR 86 | Ht 63.0 in | Wt 224.0 lb

## 2024-10-21 DIAGNOSIS — E66812 Obesity, class 2: Secondary | ICD-10-CM | POA: Diagnosis not present

## 2024-10-21 DIAGNOSIS — I252 Old myocardial infarction: Secondary | ICD-10-CM

## 2024-10-21 DIAGNOSIS — I4819 Other persistent atrial fibrillation: Secondary | ICD-10-CM | POA: Diagnosis not present

## 2024-10-21 DIAGNOSIS — I214 Non-ST elevation (NSTEMI) myocardial infarction: Secondary | ICD-10-CM

## 2024-10-21 DIAGNOSIS — G5711 Meralgia paresthetica, right lower limb: Secondary | ICD-10-CM

## 2024-10-21 DIAGNOSIS — I2510 Atherosclerotic heart disease of native coronary artery without angina pectoris: Secondary | ICD-10-CM

## 2024-10-21 DIAGNOSIS — Z23 Encounter for immunization: Secondary | ICD-10-CM

## 2024-10-21 MED ORDER — WEGOVY 0.25 MG/0.5ML ~~LOC~~ SOAJ: 0.2500 mg | SUBCUTANEOUS | 1 refills | Status: DC

## 2024-10-21 MED ORDER — NITROGLYCERIN 0.4 MG SL SUBL: SUBLINGUAL_TABLET | SUBLINGUAL | 3 refills | Status: AC

## 2024-10-21 MED ORDER — LIDOCAINE 5 % EX PTCH: 1.0000 | MEDICATED_PATCH | CUTANEOUS | 5 refills | Status: AC

## 2024-10-21 MED ORDER — GABAPENTIN 300 MG PO CAPS: 300.0000 mg | ORAL_CAPSULE | Freq: Three times a day (TID) | ORAL | 2 refills | Status: AC

## 2024-10-21 NOTE — Progress Notes (Signed)
 Established Patient Office Visit  Subjective   Patient ID: Maria Fry, female    DOB: 01-08-1951  Age: 73 y.o. MRN: [id number]  Chief Complaint  Patient presents with   Medical Management of Chronic Issues    Follow  up     HPI Discussed the use of AI scribe software for clinical note transcription with the patient, who gave verbal consent to proceed.  History of Present Illness    Maria Fry is a 73 year old female who presents for follow-up regarding weight management and medication review.  Atrial fibrillation and associated symptoms - Atrial fibrillation present for almost one year - Significant fatigue and dyspnea on exertion - Difficulty with physical activity; unable to walk up five steps without becoming short of breath - Cardiologist recommended weight loss, but limited exercise tolerance impedes progress  Weight management - Previously attempted weight loss with Wegovy  - Discontinued Wegovy  due to cost increase from $299 to $399 per month - Interested in resuming Wegovy  if insurance coverage is available due to fixed income  Right thigh pain (meralgia paresthetica) - Ongoing burning pain in right thigh - Receives injections for symptom management - Takes gabapentin  twice daily - Lidoderm  patches have provided relief in the past; inquires about resuming use  Knee swelling and osteoarthritis - Knee swelling, particularly with activity - History of knee osteoarthritis  Medication needs - Requires new prescription for nitroglycerin  due to outdated supply     Patient Active Problem List   Diagnosis Date Noted   Diabetes due to undrl condition w oth diabetic neuro comp (HCC) 07/13/2024   Chronic kidney disease, stage 3a (HCC) 11/20/2023   Decreased GFR 08/25/2023   Need for influenza vaccination 08/25/2023   Breast cancer screening by mammogram 05/16/2023   Meralgia paresthetica 11/20/2022   Lumbar back pain 09/25/2022   Encounter for general adult medical  examination with abnormal findings 09/25/2022   Refractory angina 07/01/2022   Elevated brain natriuretic peptide (BNP) level 06/19/2022   Insomnia 06/19/2022   Chest pain 06/18/2022   Hypomagnesemia 09/05/2021   Wheezing 03/06/2021   Sleep apnea 02/06/2021   Vitamin D  insufficiency 01/30/2021   Encounter to establish care 01/30/2021   Helicobacter pylori gastritis 11/01/2020   H/O adenomatous polyp of colon 10/31/2020   Constipation 10/15/2020   History of GI bleed 10/15/2020   Persistent atrial fibrillation (HCC) 10/10/2020   Hypercoagulable state due to persistent atrial fibrillation (HCC) 10/05/2020   Obesity, Class II, BMI 35-39.9    Gastroesophageal reflux disease    Gastritis    Acquired hypothyroidism    Melena    CHF exacerbation (HCC) 09/15/2020   Atrial fibrillation, chronic (HCC) 08/23/2020   Stable angina 01/14/2020   Abnormal nuclear stress test 12/08/2019   Iron deficiency anemia 11/28/2019   Hyperlipidemia 11/27/2019   OA (osteoarthritis) of knee 05/18/2019   DDD (degenerative disc disease), lumbar 05/18/2019   Obesity 12/26/2017   Seasonal allergies 08/19/2016   Glucose intolerance (impaired glucose tolerance) 05/14/2016   Chronic hip pain 05/14/2016   Chronic combined systolic and diastolic heart failure (HCC) 05/14/2016   Hypothyroidism following radioiodine therapy 11/30/2015   CAD (coronary artery disease)    NSTEMI (non-ST elevated myocardial infarction) (HCC)    Essential hypertension 06/05/2015      ROS    Objective:     BP 129/71   Pulse 86   Ht 5' 3 (1.6 m)   Wt 224 lb (101.6 kg)   SpO2 96%  BMI 39.68 kg/m  BP Readings from Last 3 Encounters:  10/21/24 129/71  09/09/24 128/84  07/13/24 122/81   Wt Readings from Last 3 Encounters:  10/21/24 224 lb (101.6 kg)  09/09/24 220 lb 9.6 oz (100.1 kg)  07/13/24 219 lb (99.3 kg)     Physical Exam Vitals and nursing note reviewed.  Constitutional:      Appearance: Normal appearance.   HENT:     Head: Normocephalic.  Eyes:     Extraocular Movements: Extraocular movements intact.     Pupils: Pupils are equal, round, and reactive to light.  Cardiovascular:     Rate and Rhythm: Normal rate and regular rhythm.  Pulmonary:     Effort: Pulmonary effort is normal.     Breath sounds: Normal breath sounds.  Musculoskeletal:     Cervical back: Normal range of motion and neck supple.  Neurological:     Mental Status: She is alert and oriented to person, place, and time.  Psychiatric:        Mood and Affect: Mood normal.        Thought Content: Thought content normal.      No results found for any visits on 10/21/24.    The ASCVD Risk score (Arnett DK, et al., 2019) failed to calculate for the following reasons:   Risk score cannot be calculated because patient has a medical history suggesting prior/existing ASCVD    Assessment & Plan:   Problem List Items Addressed This Visit       Cardiovascular and Mediastinum   NSTEMI (non-ST elevated myocardial infarction) (HCC)   - Refilled nitroglycerin  prescription.      Relevant Medications   nitroGLYCERIN  (NITROSTAT ) 0.4 MG SL tablet   CAD (coronary artery disease)   - Refilled nitroglycerin  prescription.      Relevant Medications   semaglutide -weight management (WEGOVY ) 0.25 MG/0.5ML SOAJ SQ injection   nitroGLYCERIN  (NITROSTAT ) 0.4 MG SL tablet   Persistent atrial fibrillation (HCC)   Chronic AFib with significant fatigue. Ablation low yield at current weight, reconsider with weight loss. - Sent prescription for Wegovy  to pharmacy to assess insurance coverage for weight loss medication. - Encouraged weight loss for potential ablation.      Relevant Medications   nitroGLYCERIN  (NITROSTAT ) 0.4 MG SL tablet     Nervous and Auditory   Meralgia paresthetica   Chronic right lower limb meralgia paresthetica with burning pain. - Increased gabapentin  to three times a day. - Refilled lidoderm  patches for  symptomatic relief.      Relevant Medications   semaglutide -weight management (WEGOVY ) 0.25 MG/0.5ML SOAJ SQ injection   gabapentin  (NEURONTIN ) 300 MG capsule   lidocaine  (LIDODERM ) 5 %     Other   Obesity, Class II, BMI 35-39.9 - Primary   Class 2 obesity with financial constraints affecting weight loss efforts. Insurance coverage for weight loss medications may have changed. - Sent prescription for Wegovy  to pharmacy to assess insurance coverage. - Discussed potential insurance coverage changes for weight loss medications.      Relevant Medications   semaglutide -weight management (WEGOVY ) 0.25 MG/0.5ML SOAJ SQ injection   Other Visit Diagnoses       Encounter for immunization       Relevant Orders   Flu vaccine HIGH DOSE PF(Fluzone Trivalent) (Completed)       No follow-ups on file.    Leita Longs, FNP

## 2024-10-24 NOTE — Assessment & Plan Note (Signed)
 Chronic AFib with significant fatigue. Ablation low yield at current weight, reconsider with weight loss. - Sent prescription for Wegovy  to pharmacy to assess insurance coverage for weight loss medication. - Encouraged weight loss for potential ablation.

## 2024-10-24 NOTE — Assessment & Plan Note (Signed)
 Chronic right lower limb meralgia paresthetica with burning pain. - Increased gabapentin  to three times a day. - Refilled lidoderm  patches for symptomatic relief.

## 2024-10-24 NOTE — Assessment & Plan Note (Signed)
-   Refilled nitroglycerin  prescription.

## 2024-10-24 NOTE — Assessment & Plan Note (Signed)
 Class 2 obesity with financial constraints affecting weight loss efforts. Insurance coverage for weight loss medications may have changed. - Sent prescription for Wegovy  to pharmacy to assess insurance coverage. - Discussed potential insurance coverage changes for weight loss medications.

## 2024-11-05 ENCOUNTER — Ambulatory Visit: Admitting: Cardiovascular Disease

## 2024-11-08 ENCOUNTER — Other Ambulatory Visit: Payer: Self-pay | Admitting: Cardiology

## 2024-11-08 DIAGNOSIS — I4891 Unspecified atrial fibrillation: Secondary | ICD-10-CM

## 2024-11-10 ENCOUNTER — Ambulatory Visit: Admitting: Cardiology

## 2024-11-15 ENCOUNTER — Ambulatory Visit

## 2024-11-18 ENCOUNTER — Telehealth: Payer: Self-pay | Admitting: Pharmacy Technician

## 2024-11-18 ENCOUNTER — Other Ambulatory Visit (HOSPITAL_COMMUNITY): Payer: Self-pay

## 2024-11-18 NOTE — Telephone Encounter (Signed)
 Pharmacy Patient Advocate Encounter   Received notification from Onbase that prior authorization for Wegovy  0.25MG /0.5ML auto-injectors is required/requested.   Insurance verification completed.   The patient is insured through [employer].   Per test claim:  [id number] is preferred by the insurance.  If suggested medication is appropriate, Please send in a new RX and discontinue this one. If not, please advise as to why it's not appropriate so that we may request a Prior Authorization. Please note, some preferred medications may still require a PA.  If the suggested medications have not been trialed and there are no contraindications to their use, the PA will not be submitted, as it will not be approved.

## 2024-11-19 ENCOUNTER — Other Ambulatory Visit (HOSPITAL_COMMUNITY): Payer: Self-pay

## 2024-11-19 NOTE — Telephone Encounter (Signed)
 Pharmacy Patient Advocate Encounter  Received notification from [facility] that Prior Authorization for Wegovy  0.25MG /0.5ML auto-injectors has been APPROVED from 11/18/2024 to 12/01/2025. Ran test claim, Copay is $0.00. This test claim was processed through [facility]- copay amounts may vary at other pharmacies due to pharmacy/plan contracts, or as the patient moves through the different stages of their insurance plan.   PA #/Case ID/Reference #: [id number]

## 2024-12-02 ENCOUNTER — Encounter: Payer: Self-pay | Admitting: Gastroenterology

## 2024-12-17 ENCOUNTER — Other Ambulatory Visit (HOSPITAL_COMMUNITY): Payer: Self-pay

## 2024-12-17 ENCOUNTER — Telehealth: Payer: Self-pay | Admitting: Pharmacy Technician

## 2024-12-17 NOTE — Telephone Encounter (Signed)
 Pharmacy Patient Advocate Encounter   Received notification from [facility] KEY that prior authorization for Lidocaine  5% patches is required/requested.   Insurance verification completed.   The patient is insured through [employer].   Per test claim: PA required; PA submitted to above mentioned insurance via Latent Key/confirmation #/EOC [facility] Status is pending

## 2024-12-20 ENCOUNTER — Other Ambulatory Visit: Payer: Self-pay

## 2024-12-20 DIAGNOSIS — E66812 Obesity, class 2: Secondary | ICD-10-CM

## 2024-12-20 DIAGNOSIS — I2510 Atherosclerotic heart disease of native coronary artery without angina pectoris: Secondary | ICD-10-CM

## 2024-12-20 NOTE — Telephone Encounter (Signed)
 Pharmacy Patient Advocate Encounter  Received notification from [facility] that Prior Authorization for Lidocaine  5% patches has been DENIED.  Full denial letter will be uploaded to the media tab. See denial reason below.   PA #/Case ID/Reference #: [id number]

## 2024-12-21 ENCOUNTER — Other Ambulatory Visit (HOSPITAL_COMMUNITY): Payer: Self-pay

## 2024-12-21 ENCOUNTER — Telehealth: Payer: Self-pay

## 2024-12-21 ENCOUNTER — Encounter: Payer: Self-pay | Admitting: Pharmacy Technician

## 2024-12-21 NOTE — Telephone Encounter (Signed)
 A user error has taken place: encounter opened in error, closed for administrative reasons.

## 2024-12-21 NOTE — Telephone Encounter (Signed)
 Pharmacy Patient Advocate Encounter   Received notification from [facility] [employer]s [facility] that prior authorization for Wegovy  0.25mg /0.3ml is required/requested.   Insurance verification completed.   The patient is insured through [facility].   Per test claim: PA required; PA submitted to above mentioned insurance via Latent Key/confirmation #/EOC [id number] Status is pending

## 2024-12-22 ENCOUNTER — Ambulatory Visit: Attending: Cardiology | Admitting: Cardiology

## 2024-12-22 ENCOUNTER — Encounter: Payer: Self-pay | Admitting: Cardiology

## 2024-12-22 VITALS — BP 130/86 | HR 81 | Ht 60.0 in | Wt 220.6 lb

## 2024-12-22 DIAGNOSIS — I25119 Atherosclerotic heart disease of native coronary artery with unspecified angina pectoris: Secondary | ICD-10-CM | POA: Diagnosis not present

## 2024-12-22 DIAGNOSIS — Z79899 Other long term (current) drug therapy: Secondary | ICD-10-CM | POA: Diagnosis not present

## 2024-12-22 DIAGNOSIS — I4819 Other persistent atrial fibrillation: Secondary | ICD-10-CM | POA: Diagnosis not present

## 2024-12-22 DIAGNOSIS — I5032 Chronic diastolic (congestive) heart failure: Secondary | ICD-10-CM | POA: Diagnosis not present

## 2024-12-22 MED ORDER — POTASSIUM CHLORIDE CRYS ER 20 MEQ PO TBCR: 40.0000 meq | EXTENDED_RELEASE_TABLET | Freq: Every day | ORAL | 2 refills | Status: AC

## 2024-12-22 MED ORDER — AMLODIPINE BESYLATE 2.5 MG PO TABS: 2.5000 mg | ORAL_TABLET | Freq: Every day | ORAL | 2 refills | Status: AC

## 2024-12-22 MED ORDER — FUROSEMIDE 40 MG PO TABS: 40.0000 mg | ORAL_TABLET | Freq: Every day | ORAL | 2 refills | Status: AC

## 2024-12-22 NOTE — Progress Notes (Signed)
 "    Cardiology Office Note  Date: 12/22/2024   ID: Maria Fry, DOB [birth year], MRN [id number]  History of Present Illness: Maria Fry is a 74 y.o. female last seen in June 2025.  She is here for a routine visit.  Reports occasional shocklike, neuropathic thoracic discomfort with certain position changes, no definite angina.  Heart rate looks to be reasonably well-controlled on current regimen.  She did wear a heart monitor per Dr. Nancey back in October with average heart rate in the 70s.  She had 2.7% ventricular ectopy and does feel occasional skips at times.  No syncope.  She had a follow-up visit with Dr. Nancey in October 2025, I reviewed the note.  Still considering the possibility of atrial fibrillation ablation, however further weight loss is preferred first.  She has been back on Wegovy  over the last month, hopes that this will be covered by her insurance.  Unfortunately, she did not tolerate Farxiga  due to nausea.  She does report fluctuating fluid retention and we discussed possibility of switching from Aldactone  to Kerendia to see if this is more effective.  We went over her medications which are otherwise stable from a cardiac perspective.  Physical Exam: VS:  BP 130/86 (BP Location: Left Arm)   Pulse 81   Ht 5' (1.524 m)   Wt 220 lb 9.6 oz (100.1 kg)   SpO2 97%   BMI 43.08 kg/m , BMI Body mass index is 43.08 kg/m.  Wt Readings from Last 3 Encounters:  12/22/24 220 lb 9.6 oz (100.1 kg)  10/21/24 224 lb (101.6 kg)  09/09/24 220 lb 9.6 oz (100.1 kg)    General: Patient appears comfortable at rest. HEENT: Conjunctiva and lids normal. Neck: Supple, no elevated JVP or carotid bruits. Lungs: Clear to auscultation, nonlabored breathing at rest. Cardiac: Irregularly irregular, no gallop. Extremities: No pitting edema.  ECG:  An ECG dated 09/09/2024 was personally reviewed today and demonstrated:  Atrial fibrillation at 76 bpm, nonspecific ST-T  changes.  Labwork: 01/19/2024: TSH 3.170 02/11/2024: BUN 14; Creatinine, Ser 1.03; Potassium 3.8; Sodium 137 04/27/2024: Hemoglobin 13.9; Platelets 260     Component Value Date/Time   CHOL 111 01/19/2024 0924   TRIG 57 01/19/2024 0924   HDL 40 01/19/2024 0924   CHOLHDL 2.8 01/19/2024 0924   CHOLHDL 2.7 01/03/2020 0948   VLDL 12 06/23/2017 0902   LDLCALC 58 01/19/2024 0924   LDLCALC 63 01/03/2020 0948   Other Studies Reviewed Today:  Cardiac monitor October 2025: 3 day monitor   Predominant rhythm: Atrial fibrillation, 100% burden HR: 49-151 bpm, AVG 76 bpm   2.7% ventricular ectopy   Additional arrhythmia detected: 1 4 beat run of nonsustained VT   Patient triggered events: Correlate with PVCs  Assessment and Plan:  1.  CAD status post NSTEMI in 2016 with occlusion of the distal LAD managed medically.  No definite angina on medical therapy.  She continues on Plavix  75 mg daily, Lipitor  80 mg daily, and as needed nitroglycerin .   2.  Persistent atrial fibrillation with CHA2DS2-VASc score of 5.  She has not had optimal rhythm control on either Tikosyn  or amiodarone , most recently focusing on heart rate control strategy and anticoagulation.  Plan to continue Eliquis  5 mg twice daily.  She is trying to lose weight, back on Wegovy  per PCP.  Unfortunately did not tolerate Farxiga  due to nausea.  She has follow-up with Dr. Mealor in the next month.   3.  HFpEF, LVEF approximately  55% by echocardiogram in July 2023.  Continues to report trouble with intermittent fluid accumulation on current regimen.  Did not tolerate Farxiga .  Currently on Aldactone  25 mg daily with Lasix  40 mg tablets and potassium supplement as needed.  Check BMET, we may consider switching from Aldactone  to Kerendia.   Disposition:  Follow up 6 months.  Signed, Maria Fry, M.D., F.A.C.C. [facility]

## 2024-12-22 NOTE — Patient Instructions (Addendum)
Medication Instructions:  Your physician recommends that you continue on your current medications as directed. Please refer to the Current Medication list given to you today.  Labwork: BMET today at [facility] ([address] [place]. [place]) Non-fasting  Testing/Procedures: none  Follow-Up: Your physician recommends that you schedule a follow-up appointment in: 6 months  Any Other Special Instructions Will Be Listed Below (If Applicable).  If you need a refill on your cardiac medications before your next appointment, please call your pharmacy.

## 2024-12-23 ENCOUNTER — Ambulatory Visit: Payer: Self-pay | Admitting: Cardiology

## 2024-12-23 DIAGNOSIS — Z79899 Other long term (current) drug therapy: Secondary | ICD-10-CM

## 2024-12-23 DIAGNOSIS — I5032 Chronic diastolic (congestive) heart failure: Secondary | ICD-10-CM

## 2024-12-23 LAB — BASIC METABOLIC PANEL WITH GFR
BUN/Creatinine Ratio: 18 (ref 12–28)
BUN: 19 mg/dL (ref 8–27)
CO2: 23 mmol/L (ref 20–29)
Calcium: 8.9 mg/dL (ref 8.7–10.3)
Chloride: 99 mmol/L (ref 96–106)
Creatinine, Ser: 1.03 mg/dL — ABNORMAL HIGH (ref 0.57–1.00)
Glucose: 97 mg/dL (ref 70–99)
Potassium: 4.7 mmol/L (ref 3.5–5.2)
Sodium: 135 mmol/L (ref 134–144)
eGFR: 58 mL/min/1.73 — ABNORMAL LOW

## 2024-12-23 NOTE — Telephone Encounter (Signed)
 Additional information has been requested from the patient's insurance in order to proceed with the prior authorization request. Requested information has been sent, or form has been filled out and faxed back to [phone]

## 2024-12-27 ENCOUNTER — Other Ambulatory Visit (HOSPITAL_COMMUNITY): Payer: Self-pay

## 2024-12-27 NOTE — Telephone Encounter (Signed)
 Pharmacy Patient Advocate Encounter  Received notification from [facility] that Prior Authorization for Wegovy  0.25mg  has been DENIED.  Full denial letter will be uploaded to the media tab. See denial reason below. We denied coverage for this drug because: We contacted your prescriber for information (per Part D Coverage Determination guidance Sections 40.4 and 40.5) that is necessary to process your exception request and we did not receive all of the information we need from your prescriber in order to meet the requirements for granting an exception to cover this drug. This information is necessary in order for the plan to make a decision about paying for your drug. We have made multiple attempts to obtain the necessary information from your prescriber. Medicare Part D requires that we make a decision about your Medicare Part D drug plan's coverage of the drug within a certain time frame. Since your prescriber did not provide all the additional information in the time allowed by Medicare Part D, we must deny this request due to lack of medical necessity. If you would like us  to consider this drug for coverage, please have your prescriber submit the following information:   Is the patient obese/overweight?   PA #/Case ID/Reference #: [id number]

## 2024-12-28 MED ORDER — KERENDIA 10 MG PO TABS: 10.0000 mg | ORAL_TABLET | Freq: Every day | ORAL | 6 refills | Status: DC

## 2024-12-30 ENCOUNTER — Telehealth: Payer: Self-pay | Admitting: Cardiology

## 2024-12-30 NOTE — Telephone Encounter (Signed)
 Pt c/o medication issue:  1. Name of Medication:   New medication  2. How are you currently taking this medication (dosage and times per day)?   3. Are you having a reaction (difficulty breathing--STAT)?   4. What is your medication issue?    Patient stated she was taken off Spironolactone  and was put on new medication but she cannot afford the new medication.  Patient wants advice on next steps.

## 2025-01-03 ENCOUNTER — Other Ambulatory Visit (HOSPITAL_COMMUNITY): Payer: Self-pay

## 2025-01-03 ENCOUNTER — Other Ambulatory Visit (HOSPITAL_BASED_OUTPATIENT_CLINIC_OR_DEPARTMENT_OTHER): Payer: Self-pay

## 2025-01-03 ENCOUNTER — Telehealth: Payer: Self-pay | Admitting: Pharmacy Technician

## 2025-01-03 MED ORDER — KERENDIA 10 MG PO TABS
10.0000 mg | ORAL_TABLET | Freq: Every day | ORAL | 6 refills | Status: AC
  Filled 2025-01-03 (×2): qty 30, 30d supply, fill #0

## 2025-01-03 NOTE — Telephone Encounter (Signed)
 Maria Fry

## 2025-01-03 NOTE — Telephone Encounter (Signed)
 Contacted patient and advised that she can pick up a 30 day free supply of Kerendia  from [facility] and patient assistance forms would be mailed to her home address. Will forward response to to [facility] team.

## 2025-01-04 ENCOUNTER — Other Ambulatory Visit (HOSPITAL_BASED_OUTPATIENT_CLINIC_OR_DEPARTMENT_OTHER): Payer: Self-pay

## 2025-01-05 ENCOUNTER — Other Ambulatory Visit: Payer: Self-pay

## 2025-01-05 ENCOUNTER — Other Ambulatory Visit (HOSPITAL_COMMUNITY): Payer: Self-pay

## 2025-01-05 ENCOUNTER — Encounter: Payer: Self-pay | Admitting: *Deleted

## 2025-01-05 NOTE — Telephone Encounter (Signed)
 Pharmacy Patient Advocate Encounter  Received notification from [facility] that Prior Authorization for Wegovy  0.25mg /0.48ml has been APPROVED from 12/02/24 to 12/27/25. Ran test claim, Copay is $592.30. This test claim was processed through [facility]- copay amounts may vary at other pharmacies due to pharmacy/plan contracts, or as the patient moves through the different stages of their insurance plan.   $370 applied to deductible and $222.30 amount of coinsurance.   Approval letter indexed to media.

## 2025-01-05 NOTE — Telephone Encounter (Signed)
 Pt said She will not pay for that

## 2025-01-05 NOTE — Telephone Encounter (Signed)
 Attempt #1 to call and confirm pharmacy Mailbox is full and cannot accept any messages at this time

## 2025-01-05 NOTE — Telephone Encounter (Signed)
 Hi, can the kerendia  provider portion please be filled out and faxed to [phone]? I have it scanned in media. Thank you!     She has a coupon in wam where she got kerendia  for free 01/04/25. Also got this in case she can use it too: In wam:

## 2025-01-05 NOTE — Telephone Encounter (Signed)
 Signed provider portion in media  Waiting on patient portion

## 2025-01-05 NOTE — Telephone Encounter (Signed)
 Does the Wegovy  need to be sent to [facility] pharmacy?

## 2025-01-05 NOTE — Telephone Encounter (Signed)
 Mailed kerendia  application to patient 01/05/25   Message sent to provider for their portion in other encounter

## 2025-01-14 ENCOUNTER — Ambulatory Visit: Payer: Self-pay | Admitting: Cardiovascular Disease

## 2025-01-25 ENCOUNTER — Ambulatory Visit: Payer: Medicare Other | Admitting: "Endocrinology

## 2025-02-22 ENCOUNTER — Ambulatory Visit: Payer: Self-pay
